# Patient Record
Sex: Male | Born: 1940 | Race: White | Hispanic: No | Marital: Married | Smoking: Former smoker
Health system: Southern US, Community
[De-identification: ages and names within clinical notes are randomized; demographics above are authoritative.]

## PROBLEM LIST (undated history)

## (undated) DIAGNOSIS — R9430 Abnormal result of cardiovascular function study, unspecified: Secondary | ICD-10-CM

## (undated) DIAGNOSIS — Z01810 Encounter for preprocedural cardiovascular examination: Secondary | ICD-10-CM

## (undated) DIAGNOSIS — R062 Wheezing: Secondary | ICD-10-CM

## (undated) DIAGNOSIS — C7931 Secondary malignant neoplasm of brain: Secondary | ICD-10-CM

## (undated) DIAGNOSIS — R569 Unspecified convulsions: Secondary | ICD-10-CM

## (undated) DIAGNOSIS — M519 Unspecified thoracic, thoracolumbar and lumbosacral intervertebral disc disorder: Secondary | ICD-10-CM

## (undated) DIAGNOSIS — Z923 Personal history of irradiation: Secondary | ICD-10-CM

## (undated) DIAGNOSIS — J449 Chronic obstructive pulmonary disease, unspecified: Secondary | ICD-10-CM

## (undated) DIAGNOSIS — I6529 Occlusion and stenosis of unspecified carotid artery: Secondary | ICD-10-CM

## (undated) DIAGNOSIS — Z9981 Dependence on supplemental oxygen: Secondary | ICD-10-CM

## (undated) DIAGNOSIS — R51 Headache: Secondary | ICD-10-CM

## (undated) DIAGNOSIS — J189 Pneumonia, unspecified organism: Secondary | ICD-10-CM

## (undated) DIAGNOSIS — K5900 Constipation, unspecified: Secondary | ICD-10-CM

## (undated) DIAGNOSIS — R251 Tremor, unspecified: Secondary | ICD-10-CM

## (undated) DIAGNOSIS — J029 Acute pharyngitis, unspecified: Secondary | ICD-10-CM

## (undated) DIAGNOSIS — Z9221 Personal history of antineoplastic chemotherapy: Secondary | ICD-10-CM

## (undated) DIAGNOSIS — G2581 Restless legs syndrome: Secondary | ICD-10-CM

## (undated) DIAGNOSIS — K219 Gastro-esophageal reflux disease without esophagitis: Secondary | ICD-10-CM

## (undated) DIAGNOSIS — R0602 Shortness of breath: Secondary | ICD-10-CM

## (undated) DIAGNOSIS — R918 Other nonspecific abnormal finding of lung field: Secondary | ICD-10-CM

## (undated) DIAGNOSIS — F419 Anxiety disorder, unspecified: Secondary | ICD-10-CM

## (undated) DIAGNOSIS — M1990 Unspecified osteoarthritis, unspecified site: Secondary | ICD-10-CM

## (undated) DIAGNOSIS — N400 Enlarged prostate without lower urinary tract symptoms: Secondary | ICD-10-CM

## (undated) DIAGNOSIS — C801 Malignant (primary) neoplasm, unspecified: Secondary | ICD-10-CM

## (undated) DIAGNOSIS — R519 Headache, unspecified: Secondary | ICD-10-CM

## (undated) DIAGNOSIS — I739 Peripheral vascular disease, unspecified: Secondary | ICD-10-CM

## (undated) DIAGNOSIS — Z8719 Personal history of other diseases of the digestive system: Secondary | ICD-10-CM

## (undated) DIAGNOSIS — I10 Essential (primary) hypertension: Secondary | ICD-10-CM

## (undated) HISTORY — DX: Wheezing: R06.2

## (undated) HISTORY — DX: Occlusion and stenosis of unspecified carotid artery: I65.29

## (undated) HISTORY — DX: Unspecified convulsions: R56.9

## (undated) HISTORY — DX: Restless legs syndrome: G25.81

## (undated) HISTORY — DX: Chronic obstructive pulmonary disease, unspecified: J44.9

## (undated) HISTORY — DX: Unspecified osteoarthritis, unspecified site: M19.90

## (undated) HISTORY — PX: HAND SURGERY: SHX662

## (undated) HISTORY — PX: COLONOSCOPY: SHX174

## (undated) HISTORY — DX: Unspecified thoracic, thoracolumbar and lumbosacral intervertebral disc disorder: M51.9

## (undated) HISTORY — PX: OTHER SURGICAL HISTORY: SHX169

## (undated) HISTORY — DX: Acute pharyngitis, unspecified: J02.9

---

## 2010-01-21 ENCOUNTER — Encounter: Admission: RE | Admit: 2010-01-21 | Discharge: 2010-01-21 | Payer: Self-pay | Admitting: Neurosurgery

## 2010-01-27 ENCOUNTER — Encounter (INDEPENDENT_AMBULATORY_CARE_PROVIDER_SITE_OTHER): Payer: Self-pay | Admitting: Neurosurgery

## 2010-01-27 ENCOUNTER — Ambulatory Visit (HOSPITAL_COMMUNITY): Admission: RE | Admit: 2010-01-27 | Discharge: 2010-01-27 | Payer: Self-pay | Admitting: Neurosurgery

## 2010-01-27 ENCOUNTER — Ambulatory Visit: Payer: Self-pay | Admitting: Vascular Surgery

## 2010-02-14 ENCOUNTER — Ambulatory Visit: Payer: Self-pay | Admitting: Diagnostic Radiology

## 2010-02-14 ENCOUNTER — Encounter: Payer: Self-pay | Admitting: Emergency Medicine

## 2010-02-14 ENCOUNTER — Ambulatory Visit (HOSPITAL_COMMUNITY): Admission: EM | Admit: 2010-02-14 | Discharge: 2010-02-14 | Payer: Self-pay | Admitting: Emergency Medicine

## 2010-02-19 ENCOUNTER — Encounter: Admission: RE | Admit: 2010-02-19 | Discharge: 2010-02-19 | Payer: Self-pay | Admitting: Neurosurgery

## 2010-02-25 ENCOUNTER — Ambulatory Visit: Payer: Self-pay | Admitting: Vascular Surgery

## 2010-03-12 ENCOUNTER — Ambulatory Visit (HOSPITAL_COMMUNITY): Admission: RE | Admit: 2010-03-12 | Discharge: 2010-03-12 | Payer: Self-pay | Admitting: Orthopedic Surgery

## 2010-04-16 ENCOUNTER — Ambulatory Visit: Payer: Self-pay | Admitting: Diagnostic Radiology

## 2010-04-16 ENCOUNTER — Emergency Department (HOSPITAL_COMMUNITY): Admission: EM | Admit: 2010-04-16 | Discharge: 2010-04-16 | Payer: Self-pay | Admitting: Emergency Medicine

## 2010-04-16 ENCOUNTER — Encounter: Payer: Self-pay | Admitting: Emergency Medicine

## 2010-04-17 ENCOUNTER — Ambulatory Visit (HOSPITAL_COMMUNITY): Admission: AD | Admit: 2010-04-17 | Discharge: 2010-04-17 | Payer: Self-pay | Admitting: Orthopedic Surgery

## 2010-08-26 ENCOUNTER — Ambulatory Visit (INDEPENDENT_AMBULATORY_CARE_PROVIDER_SITE_OTHER): Payer: Medicare Other | Admitting: Vascular Surgery

## 2010-08-26 ENCOUNTER — Other Ambulatory Visit: Payer: Medicare Other

## 2010-08-26 ENCOUNTER — Other Ambulatory Visit (INDEPENDENT_AMBULATORY_CARE_PROVIDER_SITE_OTHER): Payer: Medicare Other

## 2010-08-26 DIAGNOSIS — I6529 Occlusion and stenosis of unspecified carotid artery: Secondary | ICD-10-CM

## 2010-08-28 NOTE — Assessment & Plan Note (Signed)
OFFICE VISIT  [name], [name] DOB:  06-Oct-1940                                       08/26/2010 UYQIH#:[id number]  I saw this patient in the office today for continued follow-up of his carotid disease.  I had originally seen him in consultation in August 2011.  At that time he had bilateral 60%-79% carotid stenoses.  I explained that in patients who were asymptomatic we typically not recommend carotid endarterectomy unless the stenosis progressed to greater than 80%.  He comes in for a 14-month follow-up visit.  Since I saw him last, he has had no history of stroke, TIAs, expressive or receptive aphasia, or amaurosis fugax.  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  PAST MEDICAL HISTORY:  Has not changed since I saw him last.  He denies any history of diabetes, hypertension, hypercholesterolemia, or history of cardiac disease.  SOCIAL HISTORY:  Married.  He smokes currently 1-1/2 packs per day of cigarettes but had been smoking 2-1/2 packs per day until recently.  REVIEW OF SYSTEMS:  CARDIOVASCULAR:  He admits to orthopnea and dyspnea on exertion.  He had no chest pain or chest pressure.  MUSCULOSKELETAL: Does have arthritis. PULMONARY:  He had some occasional wheezing. NEUROLOGIC:  He has had some headaches.  He has had no dizziness, blackouts or seizures.  PHYSICAL EXAMINATION:  This is a pleasant 70 year old gentleman who appears his stated age.  Blood pressure 126/87, heart rate is 84 respiratory is 12.  Lungs:  Clear bilaterally to auscultation without rales, rhonchi or wheezing.  Cardiovascular:  He has bilateral carotid bruits.  He has a regular rate and rhythm.  He has palpable femoral pulses and warm well-perfused feet.  Abdomen:  Soft and nontender with normal pitched bowel sounds.  Musculoskeletal:  There are no major deformities or cyanosis.  Neurologic:  He has no focal weakness  or paresthesias.  He did have a carotid duplex scan in the office today which I have independently interpreted.  The velocities on the right have remained stable and he as a 60% to 79% stenosis on the right.  However, the velocities on the left have progressed.  He now has a greater than 80% left carotid stenosis.  Given the progression of the carotid stenosis on the left, I have recommended left carotid endarterectomy in order to lower his risk of future stroke.  We have discussed the indications for surgery and potential complications including but not limited to bleeding, stroke (peri procedural risk 1% to 2%), nerve injury, MI or other unpredictable medical problems.  Currently he is not willing to schedule surgery.  He would like to discuss this further with his family.  In the meantime, we have again discussed the importance of tobacco cessation.  Also he has not been taking aspirin as he has had problems in the past with upset stomach.  I have encouraged taken an 81 mg of enteric coated aspirin daily.  Hopefully he will call to schedule his left carotid endarterectomy in the near future.  Otherwise I have ordered a follow-up carotid duplex scan in 6 months so that we do not lose track of him however.  Again I have encouraged him to strongly consider proceeding with a left carotid endarterectomy.    [name]. [name], M.D. Electronically Signed  CSD/MEDQ  D:  08/26/2010  T:  08/27/2010  Job:  [id number]  cc:   [name], M.D. [name], M.D.

## 2010-09-10 NOTE — Procedures (Unsigned)
CAROTID DUPLEX EXAM  INDICATION:  Carotid stenosis.  HISTORY: Diabetes:  No. Cardiac:  No. Hypertension:  No. Smoking:  Yes, 1-1/2 packs per day. Previous Surgery:  No. CV History:  Asymptomatic. Amaurosis Fugax No, Paresthesias No, Hemiparesis No.                                      RIGHT             LEFT Brachial systolic pressure:         122               125 Brachial Doppler waveforms:         Normal            Normal Vertebral direction of flow:        Tardus parvus     Antegrade DUPLEX VELOCITIES (cm/sec) CCA peak systolic                   95                78 ECA peak systolic                   225               71 ICA peak systolic                   257               337 ICA end diastolic                   100               136 PLAQUE MORPHOLOGY:                  Calcific          Calcific PLAQUE AMOUNT:                      Moderate/severe   Severe PLAQUE LOCATION:                    ICA, ECA, CCA     ICA, ECA, CCA  IMPRESSION: 1. 60% to 79% stenosis of the right internal carotid artery. 2. 80% to 99% stenosis of the left internal carotid artery. 3. Right external carotid artery stenosis. 4. Low resistance in the left external carotid artery.  ___________________________________________ [name]. [name], M.D.  EM/MEDQ  D:  08/26/2010  T:  08/26/2010  Job:  [id number]

## 2010-09-24 LAB — CBC
HCT: 41.9 % (ref 39.0–52.0)
Hemoglobin: 14.1 g/dL (ref 13.0–17.0)
MCH: 33.5 pg (ref 26.0–34.0)
MCV: 101.2 fL — ABNORMAL HIGH (ref 78.0–100.0)
Platelets: 313 10*3/uL (ref 150–400)
RBC: 4.19 MIL/uL — ABNORMAL LOW (ref 4.22–5.81)
RDW: 12.9 % (ref 11.5–15.5)
WBC: 10.7 10*3/uL — ABNORMAL HIGH (ref 4.0–10.5)
WBC: 11.9 10*3/uL — ABNORMAL HIGH (ref 4.0–10.5)

## 2010-09-24 LAB — BASIC METABOLIC PANEL
BUN: 11 mg/dL (ref 6–23)
CO2: 25 mEq/L (ref 19–32)
Chloride: 100 mEq/L (ref 96–112)
Glucose, Bld: 83 mg/dL (ref 70–99)
Potassium: 4.1 mEq/L (ref 3.5–5.1)

## 2010-09-24 LAB — DIFFERENTIAL
Basophils Absolute: 0.2 10*3/uL — ABNORMAL HIGH (ref 0.0–0.1)
Eosinophils Absolute: 0.1 10*3/uL (ref 0.0–0.7)
Eosinophils Relative: 1 % (ref 0–5)
Lymphocytes Relative: 45 % (ref 12–46)
Monocytes Absolute: 0.8 10*3/uL (ref 0.1–1.0)

## 2010-09-24 LAB — SURGICAL PCR SCREEN: MRSA, PCR: NEGATIVE

## 2010-09-25 LAB — CBC
HCT: 40.7 % (ref 39.0–52.0)
HCT: 44.2 % (ref 39.0–52.0)
MCHC: 35.1 g/dL (ref 30.0–36.0)
MCHC: 34.6 g/dL (ref 30.0–36.0)
MCV: 96.9 fL (ref 78.0–100.0)
MCV: 96.5 fL (ref 78.0–100.0)
RDW: 13.7 % (ref 11.5–15.5)
RDW: 13.4 % (ref 11.5–15.5)

## 2010-09-25 LAB — POCT I-STAT, CHEM 8
Calcium, Ion: 1.13 mmol/L (ref 1.12–1.32)
HCT: 48 % (ref 39.0–52.0)
TCO2: 22 mmol/L (ref 0–100)

## 2010-09-25 LAB — DIFFERENTIAL
Basophils Absolute: 0 10*3/uL (ref 0.0–0.1)
Basophils Relative: 0 % (ref 0–1)
Eosinophils Absolute: 0.1 10*3/uL (ref 0.0–0.7)
Eosinophils Relative: 1 % (ref 0–5)
Monocytes Absolute: 0.7 10*3/uL (ref 0.1–1.0)

## 2010-10-08 ENCOUNTER — Observation Stay (HOSPITAL_COMMUNITY)
Admission: EM | Admit: 2010-10-08 | Discharge: 2010-10-09 | DRG: 312 | Disposition: A | Discharge status: Home / Self Care | Payer: Medicare Other | Attending: Internal Medicine | Admitting: Internal Medicine

## 2010-10-08 ENCOUNTER — Emergency Department (HOSPITAL_COMMUNITY): Payer: Medicare Other

## 2010-10-08 DIAGNOSIS — G2581 Restless legs syndrome: Secondary | ICD-10-CM | POA: Insufficient documentation

## 2010-10-08 DIAGNOSIS — R55 Syncope and collapse: Principal | ICD-10-CM | POA: Insufficient documentation

## 2010-10-08 DIAGNOSIS — R42 Dizziness and giddiness: Secondary | ICD-10-CM | POA: Insufficient documentation

## 2010-10-08 DIAGNOSIS — J438 Other emphysema: Secondary | ICD-10-CM | POA: Insufficient documentation

## 2010-10-08 DIAGNOSIS — F17200 Nicotine dependence, unspecified, uncomplicated: Secondary | ICD-10-CM | POA: Insufficient documentation

## 2010-10-08 DIAGNOSIS — I10 Essential (primary) hypertension: Secondary | ICD-10-CM | POA: Insufficient documentation

## 2010-10-08 DIAGNOSIS — I6529 Occlusion and stenosis of unspecified carotid artery: Secondary | ICD-10-CM | POA: Insufficient documentation

## 2010-10-08 LAB — DIFFERENTIAL
Basophils Absolute: 0 10*3/uL (ref 0.0–0.1)
Basophils Relative: 1 % (ref 0–1)
Monocytes Absolute: 0.6 10*3/uL (ref 0.1–1.0)
Neutro Abs: 5.1 10*3/uL (ref 1.7–7.7)
Neutrophils Relative %: 64 % (ref 43–77)

## 2010-10-08 LAB — POCT I-STAT, CHEM 8
Creatinine, Ser: 0.9 mg/dL (ref 0.4–1.5)
Hemoglobin: 15 g/dL (ref 13.0–17.0)
Sodium: 134 mEq/L — ABNORMAL LOW (ref 135–145)
TCO2: 24 mmol/L (ref 0–100)

## 2010-10-08 LAB — URINALYSIS, ROUTINE W REFLEX MICROSCOPIC
Glucose, UA: NEGATIVE mg/dL
Specific Gravity, Urine: 1.009 (ref 1.005–1.030)
pH: 6.5 (ref 5.0–8.0)

## 2010-10-08 LAB — POCT CARDIAC MARKERS: Myoglobin, poc: 75.6 ng/mL (ref 12–200)

## 2010-10-08 LAB — CK TOTAL AND CKMB (NOT AT ARMC)
CK, MB: 6.1 ng/mL (ref 0.3–4.0)
Relative Index: 4.7 — ABNORMAL HIGH (ref 0.0–2.5)
Total CK: 130 U/L (ref 7–232)

## 2010-10-08 LAB — CARDIAC PANEL(CRET KIN+CKTOT+MB+TROPI): Relative Index: 3.6 — ABNORMAL HIGH (ref 0.0–2.5)

## 2010-10-08 LAB — CBC
Hemoglobin: 14.1 g/dL (ref 13.0–17.0)
MCHC: 35.3 g/dL (ref 30.0–36.0)

## 2010-10-08 LAB — TROPONIN I: Troponin I: <0.01 ng/mL (ref 0.00–0.06)

## 2010-10-09 DIAGNOSIS — I517 Cardiomegaly: Secondary | ICD-10-CM

## 2010-10-09 DIAGNOSIS — R55 Syncope and collapse: Secondary | ICD-10-CM

## 2010-10-09 LAB — CBC
HCT: 40.4 % (ref 39.0–52.0)
Hemoglobin: 13.9 g/dL (ref 13.0–17.0)
WBC: 6.6 10*3/uL (ref 4.0–10.5)

## 2010-10-09 LAB — BASIC METABOLIC PANEL
CO2: 27 mEq/L (ref 19–32)
GFR calc Af Amer: >60 mL/min (ref >60)
GFR calc non Af Amer: >60 mL/min (ref >60)
Glucose, Bld: 96 mg/dL (ref 70–99)
Potassium: 4.3 mEq/L (ref 3.5–5.1)
Sodium: 140 mEq/L (ref 135–145)

## 2010-10-09 LAB — LIPID PANEL
LDL Cholesterol: 94 mg/dL (ref 0–99)
Triglycerides: 46 mg/dL (ref <150)

## 2010-10-09 LAB — CARDIAC PANEL(CRET KIN+CKTOT+MB+TROPI)
CK, MB: 5 ng/mL — ABNORMAL HIGH (ref 0.3–4.0)
Relative Index: 3.5 — ABNORMAL HIGH (ref 0.0–2.5)
Total CK: 141 U/L (ref 7–232)

## 2010-10-14 ENCOUNTER — Ambulatory Visit (INDEPENDENT_AMBULATORY_CARE_PROVIDER_SITE_OTHER): Payer: Medicare Other | Admitting: Vascular Surgery

## 2010-10-14 ENCOUNTER — Other Ambulatory Visit: Payer: Medicare Other

## 2010-10-14 DIAGNOSIS — I6529 Occlusion and stenosis of unspecified carotid artery: Secondary | ICD-10-CM

## 2010-10-14 NOTE — Discharge Summary (Signed)
[name], [name]               ACCOUNT NO.:  [id number]  MEDICAL RECORD NO.:  [id number]           PATIENT TYPE:  I  LOCATION:  2029                         FACILITY:  [facility]  PHYSICIAN:  Thad Ranger, MD       DATE OF BIRTH:  Jan 22, 1941  DATE OF ADMISSION:  10/08/2010 DATE OF DISCHARGE:                        DISCHARGE SUMMARY - REFERRING   PRIMARY CARE PHYSICIAN:  [name], MD  DISCHARGE DIAGNOSES: 1. Syncope. 2. Heavy nicotine abuse. 3. Chronic obstructive pulmonary disease. 4. Bilateral carotid disease, worse in left internal carotid artery     disease. 5. History of restless legs syndrome. 6. History of lumbar disk disease.  CONSULTATIONS:  Vascular Surgery, Dr. Myra Gianotti.  DISCHARGE MEDICATIONS: 1. Advair Diskus 250/50 mcg 1 puff inhaled b.i.d. 2. Albuterol inhaler 1 puff inhaled every 8 hours and q.4 h p.r.n. for     shortness of breath/wheezing. 3. Mucinex 600 mg p.o. b.i.d. 4. Aspirin 81 mg p.o. daily. 5. Oxycodone/acetaminophen 5/325 mg 1 tablet every evening as needed     for pain. 6. Pramipexole 0.5 mg p.o. q.p.m.  RADIOLOGICAL DATA:  Chest x-ray two-view March 29, stable to moderate changes of chronic bronchitis and/or no acute cardiopulmonary disease. CT head without contrast, October 08, 2010, no acute intracranial abnormality.  Carotid Doppler exam recently done in February 2012 showed 60-79% stenosis of the right internal carotid artery, 80-99% stenosis of left internal carotid artery.  Right external carotid artery stenosis, low resistance in the left external carotid artery.  A 2-D echo is pending at the time of my dictation.  BRIEF HOSPITALIZATION COURSE:  [name] is a 70 year old male with known history of carotid artery disease, heavy nicotine use, and COPD presented with a syncopal episode at work. 1. Syncopal episode, possibly secondary to carotid artery disease     versus vasovagal.  The patient was admitted to the telemonitor  floor and ruled out for acute ACS.  The patient recently had     carotid Dopplers study done last month which showed 80-99% stenosis     of the left internal carotid artery with 60-79% stenosis of the     right internal carotid artery.  Vascular Surgery was consulted and     per Dr. Myra Gianotti, Vascular Surgery recommendation, the patient is to     see Dr. [name] on Wednesday, October 14, 2010, for outpatient     ultrasound and further workup.  At the time of the dictation, the 2-     D echocardiogram is still pending.  The patient was continued on     his home medication which included aspirin.  Lipid profile was     essentially within normal limits. 2. COPD with heavy nicotine abuse.  The patient was counseled strongly     on smoking cessation.  He had to stop taking his inhalers.  Hence,     he was restarted on Advair Diskus and albuterol.  He was     recommended     NicoDerm patches, however, he declined.  He should have outpatient     PFTs done by his primary care physician.  DISPOSITION:  The patient will be discharged home once the 2-D echo results are received, currently awaiting.     Thad Ranger, MD     RR/MEDQ  D:  10/09/2010  T:  10/09/2010  Job:  [id number]  cc:   Molly Maduro L. Foy Guadalajara, M.D. Jorge Ny, MD [name]. [name], M.D.  Electronically Signed by Andres Labrum RAI  on 10/14/2010 04:45:22 PM

## 2010-10-15 ENCOUNTER — Inpatient Hospital Stay (HOSPITAL_COMMUNITY)
Admission: RE | Admit: 2010-10-15 | Discharge: 2010-10-16 | DRG: 039 | Disposition: A | Discharge status: Home / Self Care | Payer: Medicare Other | Source: Ambulatory Visit | Attending: Vascular Surgery | Admitting: Vascular Surgery

## 2010-10-15 ENCOUNTER — Other Ambulatory Visit: Payer: Self-pay | Admitting: Vascular Surgery

## 2010-10-15 DIAGNOSIS — J449 Chronic obstructive pulmonary disease, unspecified: Secondary | ICD-10-CM | POA: Diagnosis present

## 2010-10-15 DIAGNOSIS — J4489 Other specified chronic obstructive pulmonary disease: Secondary | ICD-10-CM | POA: Diagnosis present

## 2010-10-15 DIAGNOSIS — K219 Gastro-esophageal reflux disease without esophagitis: Secondary | ICD-10-CM | POA: Diagnosis present

## 2010-10-15 DIAGNOSIS — I658 Occlusion and stenosis of other precerebral arteries: Secondary | ICD-10-CM | POA: Diagnosis present

## 2010-10-15 DIAGNOSIS — I6529 Occlusion and stenosis of unspecified carotid artery: Secondary | ICD-10-CM

## 2010-10-15 DIAGNOSIS — Z7982 Long term (current) use of aspirin: Secondary | ICD-10-CM

## 2010-10-15 DIAGNOSIS — F17200 Nicotine dependence, unspecified, uncomplicated: Secondary | ICD-10-CM | POA: Diagnosis present

## 2010-10-15 DIAGNOSIS — Z79899 Other long term (current) drug therapy: Secondary | ICD-10-CM

## 2010-10-15 HISTORY — PX: CAROTID ENDARTERECTOMY: SUR193

## 2010-10-15 LAB — CBC
Hemoglobin: 14.4 g/dL (ref 13.0–17.0)
MCH: 32.8 pg (ref 26.0–34.0)
MCV: 95.2 fL (ref 78.0–100.0)
Platelets: 272 10*3/uL (ref 150–400)
RBC: 4.39 MIL/uL (ref 4.22–5.81)
WBC: 6.2 10*3/uL (ref 4.0–10.5)

## 2010-10-15 LAB — COMPREHENSIVE METABOLIC PANEL
ALT: 36 U/L (ref 0–53)
AST: 25 U/L (ref 0–37)
Albumin: 3.9 g/dL (ref 3.5–5.2)
CO2: 25 mEq/L (ref 19–32)
Calcium: 9.1 mg/dL (ref 8.4–10.5)
Creatinine, Ser: 0.83 mg/dL (ref 0.4–1.5)
GFR calc Af Amer: >60 mL/min (ref >60)
GFR calc non Af Amer: >60 mL/min (ref >60)
Sodium: 133 mEq/L — ABNORMAL LOW (ref 135–145)

## 2010-10-15 LAB — TYPE AND SCREEN: ABO/RH(D): A POS

## 2010-10-15 LAB — PROTIME-INR: INR: 0.91 (ref 0.00–1.49)

## 2010-10-15 LAB — SURGICAL PCR SCREEN: MRSA, PCR: NEGATIVE

## 2010-10-15 NOTE — Procedures (Signed)
OPERATIVE REPORT  [name], [name] DOB:  [birth year]                                       10/15/2010 [id number]#:[id number]  PREOPERATIVE DIAGNOSIS:  Greater than 80% left carotid stenosis.  POSTOPERATIVE DIAGNOSIS:  Greater than 80% left carotid stenosis.  PROCEDURES:  Left carotid endarterectomy with Dacron patch angioplasty.  ASSISTANT:  Dr. Imogene Rice and Della Goo, PA-C.  ANESTHESIA:  General.  INDICATIONS:  This is a pleasant 70 year old gentleman whom I had seen in consultation August 2011 with bilateral carotid disease.  On a follow- up visit in February, the left carotid stenosis had progressed to greater than 80%.  He was asymptomatic; however, given the severity of the stenosis, left carotid endarterectomy was recommended in order to lower his risk of future stroke.  Initially he was reluctant to proceed with surgery but he had an episode where he passed out of work and became unresponsive and therefore at that point was willing to reconsider carotid endarterectomy.  I discussed this with him in the office on October 14, 2010 and he wished to proceed and therefore he was brought in for elective left carotid endarterectomy.  TECHNIQUE:  The patient was taken to the operating room and received his general anesthetic.  An arterial line had been placed by Anesthesia. The left neck was prepped and draped in the usual sterile fashion.  An incision was made along the anterior border of the sternocleidomastoid and the dissection carried down to the common carotid artery which was dissected free and controlled with Rummel tourniquet.  There was extensive plaque throughout the common carotid artery and I had to extend the incision essentially down to the manubrium and fully dissect out the common carotid artery in order to get below the plaque.  Facial vein was divided between 2-0 silk ties.  The internal carotid artery was controlled above the plaque in the  external carotid artery and superior thyroid arteries were also controlled.  The patient was then heparinized, after the heparin had circulated, clamps were then placed on the internal and then the external and then the common carotid artery.  A longitudinal arteriotomy was made in the common carotid artery and this was extended through the plaque into the internal carotid artery.  Proximally the arteriotomy was extended beyond the area of disease within the common carotid artery where the plaque then tapered.  A 10 shunt was placed into the internal carotid artery, it back bled and then placed in the common carotid artery and secured with Rummel tourniquet.  Flow was reestablished at the shunt.  Next, an endarterectomy plane was established proximally and the plaque was sharply divided.  Eversion endarterectomy was performed of the external carotid artery.  Distally there was a nice taper in the plaque and no tacking sutures were required.  The artery was irrigated with copious amounts of heparin and dextran and all loose debris removed.  The Dacron patch was then sewn using continuous 6-0 Prolene suture.  Prior to completing the patch closure, the shunt was removed.  The artery was back bled and flushed appropriately, the anastomosis completed and flow reestablished first to the external carotid artery and then to the internal carotid artery.  At the completion, there was a good Doppler signal distal to the patch.  Hemostasis was obtained in the wound and the heparin was partially reversed with protamine.  The deep layer was closed with running 3-0 Vicryl.  Platysma was closed with running 3-0 Vicryl.  The skin was closed with a 4-0 subcuticular stitch.  A sterile dressing was applied.  The patient tolerated the procedure well and was transferred to the recovery room in stable condition.  All needle and sponge counts were correct.  [name]. [name], [name]. Electronically  Signed  CSD/MEDQ  Rice:  10/15/2010  T:  10/15/2010  Job:  [id number]  cc:   [name], [name]. [name], [name].

## 2010-10-15 NOTE — Assessment & Plan Note (Signed)
OFFICE VISIT  DARRYLL, RAJU DOB:  10-04-1940                                       10/14/2010 [id number]#:[id number]  He is scheduled for left carotid endarterectomy tomorrow.  I have dictated a full history and physical on the hospital line.    [name]. [name], M.D. Electronically Signed  CSD/MEDQ  D:  10/14/2010  T:  10/15/2010  Job:  [id number]

## 2010-10-15 NOTE — H&P (Signed)
[name], ERMIS               ACCOUNT NO.:  [id number]  MEDICAL RECORD NO.:  [id number]           PATIENT TYPE:  LOCATION:                                 FACILITY:  PHYSICIAN:  [name]. [name], M.D.DATE OF BIRTH:  June 06, 1941  DATE OF ADMISSION: DATE OF DISCHARGE:                             HISTORY & PHYSICAL   DATE OF PLANNED ADMISSION:  October 15, 2010  REASON FOR ADMISSION:  Greater than 80% left carotid stenosis.  HISTORY OF PRESENT ILLNESS:  This is a pleasant 70 year old gentleman who I had originally seen in consultation in August 2011 with bilateral carotid disease.  At that time, he had bilateral 60% to 79% carotid stenosis.  He was asymptomatic.  He came in for a followup visit on August 26, 2010 and the carotid stenosis on the left had progressed to greater than 80%.  I have recommended left carotid endarterectomy; however, the patient was not agreeable to proceed with the surgery.  In addition, he had a 60% to 79% right carotid stenosis.  Since that time, he had an episode at work where he briefly became unconscious.  He had spoken to Dr. Myra Gianotti who recommended that he come in for an office visit today to re-discuss proceeding with left carotid endarterectomy. He has had no further episodes.  He previously had no history of stroke, TIAs, expressive or receptive aphasia or amaurosis fugax.  PAST MEDICAL HISTORY:  Significant for a significant history of tobacco use.  He smokes two and a half packs per day.  He denies any history of diabetes, hypertension, hypercholesterolemia, history of previous myocardial infarction, history of congestive heart failure or history of COPD.  He does have a history of lumbar disk disease at the L5-S1 level and is followed by Dr. Lovell Sheehan.  PAST SURGICAL HISTORY:  Significant for repair of the left long, ring, and small finger after an injury with a saw.  SOCIAL HISTORY:  He is married.  He has one child.  He smokes 2  and a half packs per day of cigarettes and has been smoking for 50 years.  FAMILY HISTORY:  There is no history of premature cardiovascular disease.  MEDICATIONS: 1. Aspirin 81 mg p.o. daily. 2. Pramipexole dihydric 1 mg p.o. at bedtime. 3. OxyContin one p.o. daily p.r.n. pain.  REVIEW OF SYSTEMS:  GENERAL:  He has had no recent weight loss, weight gain or problems with his appetite.  CARDIOVASCULAR:  He has had no chest pain, chest pressure, palpitations, or arrhythmias.  He had no history of DVT or phlebitis.  GI:  He has had no recent change in his bowel habits and has no history of reflux.  NEUROLOGIC:  He has had no recent dizziness, blackouts, headaches, or seizures since his episode where he became unconscious at work briefly.  PULMONARY:  He has had occasional wheezing.  Hematologic, GU, ENT, musculoskeletal, psychiatric, integumentary review of systems is unremarkable and is documented on the medical history form in his chart.  ALLERGIES:  No known drug allergies.  PHYSICAL EXAMINATION:  GENERAL:  This is a pleasant 70 year old gentleman who appears his stated age.  VITAL SIGNS:  Blood pressure is 133/83, heart rate is 78, respiratory rate is 22. HEENT: Unremarkable. LUNGS:  Clear bilaterally to auscultation without rales, rhonchi, or wheezing. CARDIOVASCULAR:  He has bilateral carotid bruits.  He has a regular rate and rhythm. EXTREMITIES:  He has palpable femoral pulses and warm well-perfused feet. ABDOMEN:  Soft and nontender with normal pitched bowel sounds. MUSCULOSKELETAL:  No major deformities or cyanosis. NEUROLOGIC:  He has no focal weakness or paresthesias.  His duplex scan shows a greater than 80% left carotid stenosis with a 60% to 79% right carotid stenosis.  Given the severity of the left carotid stenosis in addition to his recent episode at work, I have recommended we proceed with left carotid endarterectomy in order to lower his risk of future  stroke.  We have discussed the indications for the procedure and the potential complications including, but not limited to bleeding, stroke (periprocedural risk 1% to 2%), nerve injury, MI, or other unpredictable medical problems.  All of his questions were answered and he is agreeable to proceed.  His surgery is scheduled for tomorrow, October 15, 2010.     [name]. [name], M.D.     CSD/MEDQ  D:  10/14/2010  T:  10/14/2010  Job:  [id number]  cc:   [name], M.D. [name], M.D.  Electronically Signed by Waverly Ferrari M.D. on 10/15/2010 04:11:19 PM

## 2010-10-16 LAB — BASIC METABOLIC PANEL
BUN: 7 mg/dL (ref 6–23)
CO2: 26 mEq/L (ref 19–32)
Chloride: 105 mEq/L (ref 96–112)
GFR calc non Af Amer: >60 mL/min (ref >60)
Glucose, Bld: 141 mg/dL — ABNORMAL HIGH (ref 70–99)
Potassium: 3.9 mEq/L (ref 3.5–5.1)
Sodium: 134 mEq/L — ABNORMAL LOW (ref 135–145)

## 2010-10-16 LAB — CBC
HCT: 35.2 % — ABNORMAL LOW (ref 39.0–52.0)
Hemoglobin: 11.7 g/dL — ABNORMAL LOW (ref 13.0–17.0)
MCV: 98.1 fL (ref 78.0–100.0)
RBC: 3.59 MIL/uL — ABNORMAL LOW (ref 4.22–5.81)
RDW: 14.1 % (ref 11.5–15.5)
WBC: 7.3 10*3/uL (ref 4.0–10.5)

## 2010-10-23 NOTE — H&P (Signed)
[name], [name]               ACCOUNT NO.:  [id number]  MEDICAL RECORD NO.:  [id number]           PATIENT TYPE:  I  LOCATION:  2029                         FACILITY:  [facility]  PHYSICIAN:  Hartley Barefoot, MD    DATE OF BIRTH:  [birth year]  DATE OF ADMISSION:  10/08/2010 DATE OF DISCHARGE:  10/09/2010                             HISTORY & PHYSICAL   PRIMARY CARE PHYSICIAN:  Molly Maduro A. Nicholos Johns, MD  CHIEF COMPLAINT:  "I passed out."  HISTORY OF PRESENT ILLNESS:  [name] is a 70 year old Caucasian male who has a history of chronic back pain and carotid stenosis.  It was found in February that he had approximately 60-80% bilateral carotid stenosis.  He is being managed by Vascular Surgery.  [name] reports that this morning he was outside with a paint roller, getting ready to paint the side of a building.  When he lifted his arms, he blacked out. He was out for several seconds.  He had no bowel or urinaryincontinence.  When he woke up he was clear-headed.  He had no aura prior to passing out, no postictal state afterwards.  He denies any headache, chest pain, palpitations, or recent illness.  PAST MEDICAL HISTORY:  Significant for 1. Emphysema. 2. Restless leg. 3. Chronic back pain. 4. Carotid artery disease. 5. Traumatic injury to his left ring finger requiring skin graft.  HOME MEDICATIONS: 1. Pramipexole 0.5 mg 1 tablet every evening. 2. Oxycodone/acetaminophen 5/325 mg 1 tablet every evening as needed     for pain. 3. Aspirin 81 mg 1 tablet every evening.  ALLERGIES:  He has no known drug allergies.  REVIEW OF SYSTEMS:  Positive for chronic cough.  Otherwise all systems were reviewed and found to be negative.  SOCIAL HISTORY:  Positive for a 50-year history of 2.5 packs of tobacco daily.  The patient also tells me that he drinks one or two 24-ounce beers daily.  He denies any recreational drug use.  He is married and lives at home with his wife.  FAMILY  HISTORY:  Significant for his father who died at age 16 with an MI; his brother who is alive at age 65, but at age 69 he had a CVA; his mother who died of alcoholic cirrhosis.  PHYSICAL EXAMINATION:  GENERAL:  This is a well-developed, thin Caucasian male lying in no apparent distress in the [facility] ED. VITAL SIGNS:  Temperature 98.7, pulse 96, respirations 20, blood pressure 147/104. HEENT:  Head is atraumatic, normocephalic.  Eyes are anicteric with pupils that are equal, round, reactive to light.  He is wearing glasses. Nose shows no nasal discharge or exterior lesions.  Mouth has moist mucous membranes with good dentition. NECK:  Supple with midline trachea.  No JVD.  No lymphadenopathy. CHEST:  Demonstrates no accessory muscle use.  He is clear to auscultation. HEART:  Regular rate and rhythm without obvious murmurs, rubs, or gallops. ABDOMEN:  Soft, nontender, nondistended without masses.  He has good bowel sounds. EXTREMITIES:  No clubbing, cyanosis, or edema.  He can move all 4 extremities and has 5/5 strength in each. SKIN:  No rashes, bruises, or lesions. NEURO:  Cranial nerves II-XII are grossly intact.  He has no facial asymmetries, no obvious focal neuro deficits. PSYCHIATRIC:  The patient is alert and oriented.  His demeanor is pleasant, cooperative.  Grooming is good.  LABORATORY DATA:  Pertinent for point-of-care cardiac enzymes x1 that are negative, CBC that is within normal limits, BMET that is within normal limits.  UA is negative for signs of infection.  Chest x-ray shows stable-to-moderate changes of chronic bronchitis and asthma but no acute cardiopulmonary infection.  ASSESSMENT:  Dr. Hartley Barefoot has seen and examined the patient, collected a history, reviewed his chart, and spoken at length with the PA, the patient, and his wife about the case.  Her impression is that this is a very pleasant 70 year old male with: 1. Syncope likely related to his  carotid artery disease, vs vaso vagal.  We will     admit him to telemetry, monitor him closely for arrhythmias, check     a 2-D echo, cycle his cardiac enzymes, check a fasting lipid panel,     hemoglobin A1c and a TSH.  We will also check a CT head. 2. Vascular disease, specifically carotid stenosis to be handled by     Dr. Myra Gianotti. 3. Hypertension.  We will provide him with hydralazine p.r.n. as we     want to be careful not to lower the blood pressure too low and to     risk hypoperfusion given his carotid stenosis. 4. Tobacco abuse.  We will ask for cessation counseling.     Additionally, the patient has been counseled in the emergency     department that he needs to stop smoking. 5. Chronic obstructive pulmonary disease.  The patient will be     continued on his Advair, his home medications. 6. This patient is a full code.  Further recommendations will be forthcoming pending this patient's medical evolution.     Stephani Police, PA   ______________________________ Hartley Barefoot, MD    MLY/MEDQ  D:  10/09/2010  T:  10/09/2010  Job:  [id number]  cc:   Elana Alm. Nicholos Johns, M.D.  Electronically Signed by Algis Downs PA on 10/11/2010 07:33:34 PM Electronically Signed by Hartley Barefoot MD on 10/23/2010 09:48:57 PM

## 2010-10-27 NOTE — Discharge Summary (Signed)
[name], [name]               ACCOUNT NO.:  [id number]  MEDICAL RECORD NO.:  [id number]           PATIENT TYPE:  I  LOCATION:  3315                         FACILITY:  [facility]  PHYSICIAN:  [name]. [name], M.D.DATE OF BIRTH:  [birth year]  DATE OF ADMISSION:  10/15/2010 DATE OF DISCHARGE:  10/16/2010                              DISCHARGE SUMMARY   ADMIT DIAGNOSIS:  Left internal carotid artery stenosis.  PAST MEDICAL HISTORY AND DISCHARGE DIAGNOSES: 1. Bilateral extracranial cerebrovascular occlusive disease, status     post left carotid endarterectomy. 2. Tobacco abuse. 3. History of lumbar disk disease.  BRIEF HISTORY:  The patient is 70 year old male who has been followed by Dr. [name] as an outpatient for known bilateral carotid disease.  At the time, he had bilateral 60-79% carotid stenosis.  He was asymptomatic.  Surgery was recommended, however, the patient did not wished to proceed at that time.  From a visit in February 2012, it was known the patient  had progression of carotid stenosis of the left to greater than 80%. He again did not wish  to proceed with surgery.   Dr. [name] saw the patient again in the office last week at which time the patient stated he had an episode at work during which he briefly became unconscious. Secondary to these findings, Dr. [name] again recommended the patient proceed with carotid endarterectomy for stroke risk reduction.  The patient agreed to proceed.  HOSPITAL COURSE:  The patient was admitted and then taken to the OR on October 15, 2010, for left carotid endarterectomy with Dacron patch angioplasty.  The patient tolerated procedure and was well hemodynamically stable immediately postoperatively.  He was transferred from the OR to the Post Anesthesia Care Unit in stable condition.  The patient was extubated without complication and woke up from the anesthesia neurologically intact.  On postoperative  day #1, the patient  is without complaint.  He is afebrile with stable vital signs.  He is tolerating ambulation, but has not eaten breakfast or voided yet.  PHYSICAL EXAMINATION:  CARDIAC:  Regular rate and rhythm. LUNGS:  Clear to auscultation. ABDOMEN:  Benign.   INCISION: clean, dry and intact with ecchymosis surrounding it.  There is no evidence of hematoma. He is neurologically intact.  The patient is doing well and is felt stable for discharge home today as long as he is able to tolerate a regular diet and void.  LABORATORY DATA:  CBC and BMP on October 16, 2010, white count 7.3, hemoglobin 11.7, hematocrit 35.2, platelets 220.  Sodium 134, potassium 3.9, BUN 7, creatinine 0.86.  Condition on discharge is stable.  DISCHARGE INSTRUCTIONS:  The patient received specific written discharge instructions regarding diet, activity, and wound care.  He will follow up with Dr. [name] in approximately 2 weeks.  He will be contacted by the office with date and time of that appointment.  DISCHARGE MEDICATIONS: 1. Percocet 5/325 mg 1-2 q.4-6 hours p.r.n. pain. 2. Advair Diskus 250/50 one puff inhaled b.i.d. 3. Albuterol inhaler 17 g 1 puff inhaled q.8 p.r.n. 4. Aspirin 81 mg daily. 5. Guaifenesin 600 mg 1 tablet p.o.  b.i.d. 6. Pramipexole  0.5 mg nightly. 7. Unisom OTC nightly p.r.n.     Pecola Leisure, PA   ______________________________ [name]. [name], M.D.    AY/MEDQ  D:  10/16/2010  T:  10/17/2010  Job:  [id number]  Electronically Signed by Pecola Leisure PA on 10/20/2010 10:02:11 AM Electronically Signed by Waverly Ferrari M.D. on 10/27/2010 08:36:59 AM

## 2010-11-04 ENCOUNTER — Ambulatory Visit (INDEPENDENT_AMBULATORY_CARE_PROVIDER_SITE_OTHER): Payer: Medicare Other | Admitting: Vascular Surgery

## 2010-11-04 DIAGNOSIS — I6529 Occlusion and stenosis of unspecified carotid artery: Secondary | ICD-10-CM

## 2010-11-05 NOTE — Assessment & Plan Note (Signed)
OFFICE VISIT  [name], [name] DOB:  1940/10/21                                       11/04/2010 [id number]#:[id number]  I saw patient in the office today for follow-up after his recent left carotid endarterectomy.  This is a pleasant 70 year old gentleman who I saw in consultation in August 2011 with bilateral carotid disease.  On a follow-up visit in February, the left carotid stenosis had progressed to greater than 80%, and left carotid endarterectomy was recommended. Initially he had decided not to proceed with surgery.  However, he had an episode where he passed out at work and became unresponsive, and for this reason he changed his mind.  He elected to proceed with left carotid endarterectomy, which was done on October 15, 2010.  He did well postoperatively and was discharged on postoperative day #1.  He returns for his first outpatient visit.  Overall he has been doing quite well. He has no specific complaints.  He has had no focal weakness or paresthesias.  PHYSICAL EXAMINATION:  Blood pressure 118/80, heart rate is 89.  His left neck incision has healed nicely.  He has good strength in the upper extremities and lower extremities bilaterally.  Lungs are clear bilaterally to auscultation.  Overall, I am pleased with his progress.  He does have a 60% to 79% right carotid stenosis; therefore, I have ordered a follow-up carotid duplex scan in 6 months, and I will see him back at that time.  In the meantime, he knows to continue taking his aspirin.  He is having some posterior neck pain, and I did give him a prescription for Percocet for pain for this.    [name]. [name], M.D. Electronically Signed  CSD/MEDQ  D:  11/04/2010  T:  11/05/2010  Job:  [id number]  cc:   [name], M.D. [name], M.D.

## 2010-11-24 NOTE — Consult Note (Signed)
NEW PATIENT CONSULTATION   [name], [name]  DOB:  12/15/1940                                       02/25/2010  ZOXWR#:[id number]   I saw the patient in the office today in consultation concerning  bilateral carotid disease.  This is a pleasant 70 year old right-handed  gentleman who was found to have a carotid bruit.  This prompted a  carotid duplex scan which was done at [facility] on 01/27/2010 which  showed bilateral 60% to 79% carotid stenoses.  He was sent for vascular  consultation by Dr. Lovell Sheehan.  The patient is right-handed.  He denies  any previous history of stroke, TIAs, expressive or receptive aphasia or  amaurosis fugax.   PAST MEDICAL HISTORY:  Fairly unremarkable.  He denies any history of  diabetes, hypertension, hypercholesterolemia, history of previous  myocardial infarction, history of congestive heart failure or history of  COPD.  He does have a history of restless leg syndrome.  He also has  some lumbar disk disease at the L5-S1 level and is followed by Dr.  Lovell Sheehan.   PAST SURGICAL HISTORY:  Significant for recent repair of his left long,  ring and small finger after an injury with a saw.   SOCIAL HISTORY:  He is married.  He has 1 child.  He smokes 2 to 2-1/2  packs per day of cigarettes and has been smoking for 50 years.   FAMILY HISTORY:  There is no history of premature cardiovascular  disease.   REVIEW OF SYSTEMS:  GENERAL:  He has had no recent weight loss, weight  gain or problems with his appetite.  CARDIOVASCULAR:  He has had no chest pain, chest pressure, palpitations  or arrhythmias.  He does admit to dyspnea on exertion.  He has had no  history of DVT or phlebitis.  ENT:  He has had a sore throat.  GI, neurologic, pulmonary, hematologic, GU, musculoskeletal,  psychiatric, integumentary review of systems is unremarkable and is  documented on the medical history form in his chart.   PHYSICAL EXAMINATION:  This is a pleasant  70 year old gentleman who  appears his stated age.  Blood pressure is 115/76, heart rate is 79,  respiratory rate is 12. HEENT:  Unremarkable.  Lungs:  Clear bilaterally  to auscultation without rales, rhonchi or wheezing.  Cardiovascular:  He  has bilateral carotid bruits.  He has a regular rate and rhythm.  He has  palpable femoral pulses and warm and well-perfused feet without ischemic  ulcers.  He has no significant lower extremity swelling.  Abdomen:  Soft  and nontender with normal-pitched bowel sounds.  Musculoskeletal:  He  has dressings on the 3 fingers that were operated on on the left hand.  Neurologic:  He has no focal weakness or paresthesias.  Skin:  There are  no ulcers or rashes.   I have reviewed his duplex study at [facility] which at that time showed an  end-diastolic velocity of 81 cm/sec on the right and 91 cm/sec on the  left.  We did confirm these findings with duplex scan in our office  today which I have independently interpreted, and this shows 60% to 79 %  carotid stenoses bilaterally.  The stenosis on the left is in the higher  end of that range.   I have explained that for stenosis less than 80%  which are asymptomatic,  we would typically follow these at 6 months intervals and only consider  carotid endarterectomy if the stenosis progresses to greater than 80% or  he develops new neurologic symptoms.  We have reviewed the potential  symptoms of cerebrovascular disease.  I plan on seeing him back in 6  months, and I have ordered a followup carotid duplex scan at that time.  He does know to continue taking his aspirin.  He knows to call sooner if  he has problems.     [name]. [name], M.D.  Electronically Signed   CSD/MEDQ  D:  02/25/2010  T:  02/26/2010  Job:  [id number]   cc:   [name], M.D.  [name], M.D.

## 2010-11-24 NOTE — Procedures (Signed)
CAROTID DUPLEX EXAM   INDICATION:  Carotid disease.   HISTORY:  Diabetes:  No.  Cardiac:  No.  Hypertension:  No.  Smoking:  Yes.  Previous Surgery:  No.  CV History:  No.  Amaurosis Fugax No, Paresthesias No, Hemiparesis No.                                       RIGHT             LEFT  Brachial systolic pressure:         120               130  Brachial Doppler waveforms:         Normal            Normal  Vertebral direction of flow:        Antegrade         Antegrade  DUPLEX VELOCITIES (cm/sec)  CCA peak systolic                   115               73  ECA peak systolic                   195               66  ICA peak systolic                   246               385  ICA end diastolic                   105               104  PLAQUE MORPHOLOGY:                  Calcific          Calcific  PLAQUE AMOUNT:                      Moderate-to-severe                  Moderate-to-severe  PLAQUE LOCATION:                    ICA, ECA, CCA     ICA, CCA, ECA   IMPRESSION:  1. 60% to 79% stenosis of the right proximal internal carotid artery.  2. High-end 60% to 79% stenosis of the left proximal internal carotid      artery.  3. Right external carotid artery stenosis.  Low resistive left      external carotid artery waveforms noted.   ___________________________________________  [name]. [name], M.D.   EM/MEDQ  D:  02/25/2010  T:  02/25/2010  Job:  [id number]

## 2011-01-15 ENCOUNTER — Emergency Department (HOSPITAL_COMMUNITY): Payer: Medicare Other

## 2011-01-15 ENCOUNTER — Inpatient Hospital Stay (HOSPITAL_COMMUNITY)
Admission: EM | Admit: 2011-01-15 | Discharge: 2011-01-17 | DRG: 190 | Disposition: A | Discharge status: Home / Self Care | Payer: Medicare Other | Attending: Internal Medicine | Admitting: Internal Medicine

## 2011-01-15 DIAGNOSIS — G4700 Insomnia, unspecified: Secondary | ICD-10-CM | POA: Diagnosis present

## 2011-01-15 DIAGNOSIS — J479 Bronchiectasis, uncomplicated: Secondary | ICD-10-CM | POA: Diagnosis present

## 2011-01-15 DIAGNOSIS — J441 Chronic obstructive pulmonary disease with (acute) exacerbation: Principal | ICD-10-CM | POA: Diagnosis present

## 2011-01-15 DIAGNOSIS — E871 Hypo-osmolality and hyponatremia: Secondary | ICD-10-CM | POA: Diagnosis present

## 2011-01-15 DIAGNOSIS — F17200 Nicotine dependence, unspecified, uncomplicated: Secondary | ICD-10-CM | POA: Diagnosis present

## 2011-01-15 DIAGNOSIS — K219 Gastro-esophageal reflux disease without esophagitis: Secondary | ICD-10-CM | POA: Diagnosis present

## 2011-01-15 DIAGNOSIS — G2581 Restless legs syndrome: Secondary | ICD-10-CM | POA: Diagnosis present

## 2011-01-15 DIAGNOSIS — R000 Tachycardia, unspecified: Secondary | ICD-10-CM | POA: Diagnosis present

## 2011-01-15 DIAGNOSIS — J9620 Acute and chronic respiratory failure, unspecified whether with hypoxia or hypercapnia: Secondary | ICD-10-CM | POA: Diagnosis present

## 2011-01-15 LAB — URINE MICROSCOPIC-ADD ON

## 2011-01-15 LAB — URINALYSIS, ROUTINE W REFLEX MICROSCOPIC
Glucose, UA: NEGATIVE mg/dL
Leukocytes, UA: NEGATIVE
Nitrite: NEGATIVE
Specific Gravity, Urine: 1.026 (ref 1.005–1.030)
pH: 6 (ref 5.0–8.0)

## 2011-01-15 LAB — DIFFERENTIAL
Basophils Absolute: 0 10*3/uL (ref 0.0–0.1)
Basophils Relative: 0 % (ref 0–1)
Eosinophils Relative: 0 % (ref 0–5)
Monocytes Absolute: 1.1 10*3/uL — ABNORMAL HIGH (ref 0.1–1.0)
Monocytes Relative: 8 % (ref 3–12)
Neutro Abs: 12.1 10*3/uL — ABNORMAL HIGH (ref 1.7–7.7)

## 2011-01-15 LAB — POCT I-STAT 3, ART BLOOD GAS (G3+)
Bicarbonate: 24.1 mEq/L — ABNORMAL HIGH (ref 20.0–24.0)
TCO2: 25 mmol/L (ref 0–100)
pO2, Arterial: 60 mmHg — ABNORMAL LOW (ref 80.0–100.0)

## 2011-01-15 LAB — COMPREHENSIVE METABOLIC PANEL
Albumin: 3.3 g/dL — ABNORMAL LOW (ref 3.5–5.2)
BUN: 9 mg/dL (ref 6–23)
Calcium: 9.1 mg/dL (ref 8.4–10.5)
GFR calc Af Amer: >60 mL/min (ref >60)
Glucose, Bld: 110 mg/dL — ABNORMAL HIGH (ref 70–99)
Sodium: 130 mEq/L — ABNORMAL LOW (ref 135–145)
Total Protein: 6.8 g/dL (ref 6.0–8.3)

## 2011-01-15 LAB — CBC
HCT: 39 % (ref 39.0–52.0)
Hemoglobin: 13.9 g/dL (ref 13.0–17.0)
MCH: 33.5 pg (ref 26.0–34.0)
MCHC: 35.6 g/dL (ref 30.0–36.0)
RDW: 14 % (ref 11.5–15.5)

## 2011-01-15 LAB — D-DIMER, QUANTITATIVE: D-Dimer, Quant: 0.68 ug/mL-FEU — ABNORMAL HIGH (ref 0.00–0.48)

## 2011-01-15 LAB — PRO B NATRIURETIC PEPTIDE: Pro B Natriuretic peptide (BNP): 296.8 pg/mL — ABNORMAL HIGH (ref 0–125)

## 2011-01-16 ENCOUNTER — Inpatient Hospital Stay (HOSPITAL_COMMUNITY): Payer: Medicare Other

## 2011-01-16 LAB — CARDIAC PANEL(CRET KIN+CKTOT+MB+TROPI)
CK, MB: 10.5 ng/mL (ref 0.3–4.0)
CK, MB: 11 ng/mL (ref 0.3–4.0)
Relative Index: 3.6 — ABNORMAL HIGH (ref 0.0–2.5)
Total CK: 269 U/L — ABNORMAL HIGH (ref 7–232)
Troponin I: <0.3 ng/mL (ref <0.30)
Troponin I: <0.3 ng/mL (ref <0.30)

## 2011-01-16 LAB — TSH: TSH: 0.893 u[IU]/mL (ref 0.350–4.500)

## 2011-01-16 LAB — URINE CULTURE: Culture: NO GROWTH

## 2011-01-16 MED ORDER — IOHEXOL 350 MG/ML SOLN
80.0000 mL | Freq: Once | INTRAVENOUS | Status: AC | PRN
  Administered 2011-01-16: 80 mL via INTRAVENOUS

## 2011-01-17 LAB — BASIC METABOLIC PANEL
BUN: 13 mg/dL (ref 6–23)
Chloride: 101 mEq/L (ref 96–112)
GFR calc Af Amer: >60 mL/min (ref >60)
Glucose, Bld: 128 mg/dL — ABNORMAL HIGH (ref 70–99)
Potassium: 3.8 mEq/L (ref 3.5–5.1)
Sodium: 135 mEq/L (ref 135–145)

## 2011-01-17 LAB — LIPID PANEL
HDL: 57 mg/dL (ref >39)
Total CHOL/HDL Ratio: 2.5 RATIO
Triglycerides: 66 mg/dL (ref <150)

## 2011-01-17 LAB — CBC
HCT: 35.6 % — ABNORMAL LOW (ref 39.0–52.0)
Hemoglobin: 12 g/dL — ABNORMAL LOW (ref 13.0–17.0)
RDW: 14.4 % (ref 11.5–15.5)
WBC: 14.5 10*3/uL — ABNORMAL HIGH (ref 4.0–10.5)

## 2011-01-29 NOTE — Discharge Summary (Signed)
[name], [name] NO.:  [id number]  MEDICAL RECORD NO.:  [id number]  LOCATION:  4743                         FACILITY:  [facility]  PHYSICIAN:  [name], MDDATE OF BIRTH:  [birth year]  DATE OF ADMISSION:  01/15/2011 DATE OF DISCHARGE:  01/17/2011                              DISCHARGE SUMMARY   PRIMARY CARE PHYSICIAN:  Dr. Bjorn Rice, [place].  DISCHARGE DIAGNOSES: 1. Acute-on-chronic respiratory failure with hypoxemia. 2. Bronchitis/bronchiectasis. 3. Restless legs syndrome. 4. Gastroesophageal reflux disease. 5. Carotid stenosis, status post left carotid endarterectomy. 6. Insomnia. 7. Tobacco abuse. 8. Elevated D-dimer with a negative CT angio. 9. Microscopic hematuria.  DISCHARGE MEDICATIONS: 1. Advair 250/50 one puff inhaled twice a day. 2. Moxifloxacin 400 mg by mouth daily for 7 days. 3. Prednisone 20 mg with a special instruction for steroids tapering,     taking 4 tablets by mouth daily x2 days, then 3 tablets by mouth     daily x2 days, then 2 tablets by mouth daily x2 days, then 1 tablet     by mouth daily x2 days and then half tablet by mouth daily x2 days,     then stop.  The patient will be using this tapering dose for 10     days. 4. Protonix 40 mg by mouth twice a day. 5. Spiriva 80 mcg inhaled daily. 6. Albuterol 1 puff inhaled every 8 hours as needed for shortness of     breath and wheezing. 7. Aspirin enteric coated 81 mg 1 tablet by mouth every evening. 8. Oxycodone slash acetaminophen 5/325, 1-2 tablets by mouth every 4     hours while awake as needed for pain. 9. Pramipexole 0.5 mg 2 tablets by mouth every evening. 10.Unison 1 tablet by mouth daily at bedtime as needed for difficulty     falling asleep and insomnia.  DISPOSITION AND FOLLOWUP:  The patient has been discharged in a stable, improved condition.  Currently not complaining of any shortness of breath and no wheezes.  He also denies any chest pain,  palpitations, nausea, vomiting, or any other complaints.  The patient had received instructions to arrange a followup appointment with primary care physician, Dr. Foy Rice in about 7-10 days.  He also received instructions to stop smoking and to be compliant with his inhalers and medications as prescribed.  It is going to be important at the moment of his followup to repeat a urinalysis to make sure that the patient did not have any further microscopic hematuria as it was one of the abnormal findings on his hospitalization at the moment of admission.  The patient did not have any dysuria, discomfort, and his urinalysis was completely negative, but will be important due to his smoking history, that if he continues to have a microscopic hematuria, he will need to be referred to a urologist in order to rule out any bladder or kidney cancers.  HISTORY OF PRESENT ILLNESS:  For full details, please refer to dictation done by [name] on January 15, 1011, but briefly this is a 70 year old male with a history of COPD, tobacco abuse (that is ongoing) who comes into the hospital complaining of shortness of  breath that had started on Wednesday prior to admission.  He apparently was exposed to propane gas and since then he has shortness of breath and cough with yellow sputum. He denies any fever or chills.  He notes some occasional chest discomfort when he coughs, but denies any palpitations, any nausea, any vomiting, diarrhea, bright red blood per rectum or black stools.  The patient also denies any dysuria.  The patient was apparently in the ER and was treated with several albuterol nebulizers, but he remained wheezing and short of breath.  The patient was admitted for COPD exacerbation.  Note, the patient was tachycardic and had a D-dimer that was elevated.  A CT angio of the chest was done, ruling out any pulmonary embolism.  PROCEDURE PERFORMED DURING THIS HOSPITALIZATION:  The patient had on January 15, 2011 a chest x-ray that demonstrated emphysema with interstitial prominence of the lung bases suggesting interstitial pulmonary edema. He also had a CT angio of the chest, January 16, 2011 that demonstrated no pulmonary embolism, focal areas of consolidation within the right upper lung anteriorly and also at the lingula that were nonspecific but could represent atelectasis versus aspiration versus bronchiectasis/bronchitis.  There was a small amount of mucus within the main bronchi.  No other procedures were performed during this admission. No consultations were made during this admission.  LABORATORY DATA:  The patient has ABG with a pH of 7.428, pCO2 of 36.5, pO2 60, bicarb 24.1.  A CBC with differential demonstrated white blood cells of 14.8, hemoglobin 13.9, platelets 225.  A comprehensive metabolic panel on admission showed a sodium of 130, potassium 3.9, chloride 94, bicarb 25, glucose 110, BUN 9, creatinine 0.79. Urinalysis; negative nitrite, negative leukocytes, small amount of blood with 3-6 red blood cells under microscopy.  Otherwise negative.  Cardiac markers negative x3.  BNP 296, D-dimer 0.68, a TSH 0.893.  Urine culture was negative without any growth.  A lipid profile demonstrated a total cholesterol of 145, triglycerides 66, HDL 57, LDL 75.  At discharge, a CBC with white blood cells of 14, hemoglobin 12, platelets 261.  BMET sodium 135, potassium 3.8, chloride 101, bicarb 27, glucose 128, BUN 13, creatinine 0.69.  HOSPITAL COURSE BY PROBLEM: 1. Acute-on-chronic respiratory failure with hypoxemia secondary to     COPD exacerbation.  The patient received IV steroids, dual     nebulizers, and was also placed on antibiotics.  After receiving     about 48 hours of treatment, the patient's wheezing, shortness of     breath, and discomfort in his chest completely subsided.  At that     moment, for consolidation, he is going to continue p.o. antibiotics     and also tapering of  steroids as instructed and will follow up with     primary care physician for further evaluation and followup.  The     patient had received prescription for his inhalers, specifically     Spiriva, Advair and will also have a prescription for prednisone     and Avelox. 2. Bronchitis and bronchiectasis that is going to be treated with     Avelox for a total of 10 days.  The patient received 3 days of     antibiotics inside the hospital.  Will continue taking it for 7     more days. 3. History of gastroesophageal reflux disease that had been     intermittently, but at this point with the use of prednisone has  pretty much exacerbated.  The patient had been started on Protonix     twice a day and will follow with primary care physician for further     adjustment of his medication as needed. 4. Restless legs syndrome.  We are going to continue Mirapex. 5. Carotid stenosis.  We will continue aspirin. 6. Insomnia.  We are going to continue Unison over the counter as     needed to fall asleep. 7. Tobacco abuse.  The patient received counseling throughout this     hospitalization by myself and also the social worker with tips and     advises for him to follow in order to help him quit.  He will     continue requiring support from primary care physician and has been     instructed to cut down and to use nicotine patch and electronic     cigarettes to help him in this quitting process. 8. Microscopic hematuria.  He has no complaints of dysuria, negative     urinalysis, and also urine culture at this moment.  No further     workup had been done throughout this admission.  The patient will     require to have a repeat urinalysis and if the microscopic     hematuria is still present, he will require a referral to see a     urologist.  DISCHARGE PHYSICAL EXAMINATION:  VITAL SIGNS:  Temperature 97.4, heart rate 86, respiratory rate 19, blood pressure 120/62, oxygen saturation 92% on room  air. GENERAL:  No acute distress. RESPIRATORY:  He was minimal minimally wheezing.  No crackles, no rhonchi. HEART:  Regular rate and rhythm. ABDOMEN:  Soft, nontender, nondistended with positive bowel sounds. EXTREMITIES:  Without any edema. NEUROLOGIC:  Nonfocal.     Darren Randy, MD     CEM/MEDQ  D:  01/17/2011  T:  01/18/2011  Job:  [id number]  cc:   Dr. Foy Rice  Electronically Signed by Vassie Loll MD on 01/29/2011 04:54:09 PM

## 2011-02-24 ENCOUNTER — Ambulatory Visit: Payer: PRIVATE HEALTH INSURANCE | Admitting: Vascular Surgery

## 2011-02-24 ENCOUNTER — Other Ambulatory Visit: Payer: PRIVATE HEALTH INSURANCE

## 2011-02-27 NOTE — H&P (Signed)
NAMEMAYFIELD, SCHOENE NO.:  [id number]  MEDICAL RECORD NO.:  [id number]  LOCATION:  [facility]                         FACILITY:  [facility]  PHYSICIAN:  Massie Maroon, MD        DATE OF BIRTH:  August 05, 1940  DATE OF ADMISSION:  01/15/2011 DATE OF DISCHARGE:                             HISTORY & PHYSICAL   CHIEF COMPLAINT:  Shortness of breath.  HISTORY OF PRESENT ILLNESS:  This is a 70 year old male with a history of COPD, complains of shortness of breath starting Wednesday.  On Wednesday, he apparently was exposed to propane gas.  Since then, he has had shortness of breath and cough with yellow sputum.  He denies any fever or chills.  He notes some occasional chest pain when he coughs, but denies any palpitations, nausea, vomiting, diarrhea, bright red blood per rectum or black stool.  The patient was apparently wheezy in the ER and treated with several albuterol nebs but remained wheezy and short of breath.  The patient will be admitted for COPD exacerbation. Note, the patient was tachycardic and a D-dimer is pending and if positive, we will obtain a CTA chest to rule out pulmonary embolus.  PAST MEDICAL HISTORY: 1. COPD. 2. Restless legs. 3. Acute carotid stenosis status post left CEA.  PAST SURGICAL HISTORY:  Left CEA, surgery to his fingers after trauma.  SOCIAL HISTORY:  The patient is a current smoker, he smokes 1-pack per day x60 years.  He drinks about 3-4 beers a week but this is probably an underestimate according to his wife.  He works as a Surveyor, [employer].  FAMILY HISTORY:  Mother died at age 69 of liver failure, was an alcoholic.  His father died at age 23 and was a smoker.  ALLERGIES:  No known drug allergies.  MEDICATIONS: 1. Unisom 1 p.o. at bedtime p.r.n. insomnia. 2. Pramipexole 0.5 mg 2 p.o. at bedtime for restless legs. 3. Percocet 5/325 mg 1-2 p.o. q.4 h. p.r.n. pain. 4. Enteric-coated aspirin 81 mg p.o. daily. 5. Albuterol HFA 1 puff q.8 h.  p.r.n.  REVIEW OF SYSTEMS:  Negative for all 10 organ systems except for pertinent positives stated above.  PHYSICAL EXAMINATION:  VITAL SIGNS:  Temperature 97.8, pulse 130, blood pressure 147/100, 126/73 in the left arm, the left arm is probably more accurate, and pulse ox 88% on room air. HEENT:  Anicteric. NECK:  No JVD. HEART:  Tachycardic, S1-S2.  No murmurs, gallops, or rubs. LUNGS:  Positive for expiratory wheeze, especially left greater than right. ABDOMEN:  Soft, nontender, nondistended.  Positive bowel sounds. EXTREMITIES:  No cyanosis, clubbing, or edema. SKIN:  No rashes. LYMPH NODES:  No adenopathy. NEUROLOGIC:  Nonfocal.  Cranial nerves II-XII intact.  Reflexes 2+, symmetric, diffuse with downgoing toes bilaterally.  Motor strength 5/5 in all 4 extremities, pinprick intact.  LABORATORY DATA:  PH 7.248, pCO2 36.5, pO2 60 on FIO2 of room air.  WBC 14.8, hemoglobin 13.9, and platelet count is 225.  Sodium 130, potassium 3.9, BUN 9, and creatinine 0.79.  Urinalysis:  Rbc's 3-6.  Chest x-ray:  Emphysema, interstitial pulmonary edema (BNP is pending), bilateral apical scarring is noted.  ASSESSMENT AND  PLAN: 1. Dyspnea secondary to chronic obstructive pulmonary disease     exacerbation:  The patient will be treated with Solu-Medrol 80 mg     IV q.8, Avelox 400 mg IV daily along with Spiriva 1 puff daily, and     albuterol neb q.2 h. p.r.n. wheezing. 2. Tachycardia:  Check CK, CK-MB, troponin I q.6 h. x3 sets.  The     patient will be hydrated with slight normal saline.  I wonder if     his tachycardia may be due to albuterol versus hypoxemia.  We will     check a D-dimer and if positive obtain a CT angio chest to rule out     pulmonary embolism.  If tachycardia is persistent, please order a     cardiac 2-D echo in the a.m.  We will also check a TSH to rule out     any hyperthyroidism. 3. Hyponatremia:  I wonder if this may be a result of his drinking.     If his  sodium continues to be low, please pursue a workup including     serum os and urine os and urine sodium, TSH, and cortisol level. 4. Microscopic hematuria:  He is a smoker.  Please obtain a Urology     consult, if not an inpatient then as an outpatient. 5. Deep venous thrombosis prophylaxis.  Sequential compression     devices.     Massie Maroon, MD     JYK/MEDQ  D:  01/15/2011  T:  01/15/2011  Job:  [id number]  cc:   Molly Maduro A. Nicholos Johns, M.D.  Electronically Signed by Pearson Grippe MD on 02/27/2011 02:27:42 PM

## 2011-03-24 ENCOUNTER — Encounter: Payer: Self-pay | Admitting: Vascular Surgery

## 2011-05-11 ENCOUNTER — Encounter: Payer: Self-pay | Admitting: Vascular Surgery

## 2011-05-12 ENCOUNTER — Other Ambulatory Visit (INDEPENDENT_AMBULATORY_CARE_PROVIDER_SITE_OTHER): Payer: Medicare Other | Admitting: *Deleted

## 2011-05-12 ENCOUNTER — Encounter: Payer: Self-pay | Admitting: Vascular Surgery

## 2011-05-12 ENCOUNTER — Ambulatory Visit (INDEPENDENT_AMBULATORY_CARE_PROVIDER_SITE_OTHER): Payer: Medicare Other | Admitting: Vascular Surgery

## 2011-05-12 VITALS — BP 149/87 | HR 87 | Temp 97.9°F | Ht 69.0 in | Wt 157.0 lb

## 2011-05-12 DIAGNOSIS — I6529 Occlusion and stenosis of unspecified carotid artery: Secondary | ICD-10-CM

## 2011-05-12 DIAGNOSIS — Z48812 Encounter for surgical aftercare following surgery on the circulatory system: Secondary | ICD-10-CM

## 2011-05-12 DIAGNOSIS — F17200 Nicotine dependence, unspecified, uncomplicated: Secondary | ICD-10-CM

## 2011-05-12 NOTE — Progress Notes (Signed)
[facility]  Patient name: [name] MRN: [id number] DOB: Jan 28, 1941 Sex: male  CC: followup of carotid disease  HPI: [name] is a 70 y.o. male who underwent a left carotid endarterectomy on 10/15/2010. He comes in for a 6 month followup visit. Since I saw him last he said no history of stroke, TIAs, expressive or receptive aphasia, or amaurosis fugax. He does occasionally experience dizziness when he looks up suddenly. This happens rarely. His been no significant change in his medical history.  Past Medical History  Diagnosis Date  . Arthritis   . Wheezing   . Sore throat   . Headache   . Carotid artery occlusion   . COPD (chronic obstructive pulmonary disease)   . Lumbar disc disease   . Restless leg syndrome     Family History  Problem Relation Age of Onset  . Heart disease Father     SOCIAL HISTORY: History  Substance Use Topics  . Smoking status: Current Everyday Smoker -- 2.0 packs/day    Types: Cigarettes  . Smokeless tobacco: Not on file  . Alcohol Use: 4.2 oz/week    7 Cans of beer per week    No Known Allergies  Current Outpatient Prescriptions  Medication Sig Dispense Refill  . aspirin EC 81 MG tablet Take 81 mg by mouth daily.        Marland Kitchen oxyCODONE-acetaminophen (PERCOCET) 5-325 MG per tablet Take 1 tablet by mouth every 4 (four) hours as needed.        . pramipexole (MIRAPEX) 1 MG tablet Take 1 mg by mouth at bedtime.        . OxyCODONE HCl (OXYCONTIN PO) Take by mouth.         REVIEW OF SYSTEMS: Arly.Keller ] denotes positive finding; [  ] denotes negative finding CARDIOVASCULAR:  [ ]  chest pain   [ ]  chest pressure   [ ]  palpitations   [ ]  orthopnea   [ ]  dyspnea on exertion   [ ]  claudication   [ ]  rest pain   [ ]  DVT   [ ]  phlebitis PULMONARY:   [ ]  productive cough   [ ]  asthma   [ ]  wheezing NEUROLOGIC:   [ ]  weakness  [ ]  paresthesias  [ ]  aphasia  [ ]  amaurosis  [ ]  dizziness HEMATOLOGIC:   [ ]  bleeding problems    [ ]  clotting disorders MUSCULOSKELETAL:  Arly.Keller ] joint pain right hand   [ ]  joint swelling GASTROINTESTINAL: [ ]   blood in stool  [ ]   hematemesis GENITOURINARY:  [ ]   dysuria  [ ]   hematuria PSYCHIATRIC:  [ ]  history of major depression INTEGUMENTARY:  [ ]  rashes  [ ]  ulcers CONSTITUTIONAL:  [ ]  fever   [ ]  chills  PHYSICAL EXAM: Filed Vitals:   05/12/11 1412  BP: 149/87  Pulse: 87  Temp: 97.9 F (36.6 C)  TempSrc: Oral  Height: 5\' 9"  (1.753 m)  Weight: 157 lb (71.215 kg)  SpO2: 97%   Body mass index is 23.18 kg/(m^2). GENERAL: The patient is a well-nourished male, in no acute distress. The vital signs are documented above. CARDIOVASCULAR: There is a regular rate and rhythm without significant murmur appreciated. I do not detect any carotid bruits. He has no significant lower Shri swelling. PULMONARY: There is good air exchange bilaterally without wheezing or rales. ABDOMEN: Soft and non-tender with normal pitched bowel sounds. Do not palpate an abdominal aortic aneurysm.  MUSCULOSKELETAL: There are no major deformities or cyanosis. NEUROLOGIC: No focal weakness or paresthesias are detected. SKIN: There are no ulcers or rashes noted. PSYCHIATRIC: The patient has a normal affect.  DATA:  I have independently interpreted his carotid duplex scan which shows no evidence of recurrent carotid stenosis on the left. On the right side he has a 40-59% stenosis.  MEDICAL ISSUES: He understands we would not consider right carotid endarterectomy unless the stenosis progressed to greater than 80%. I've ordered a followup carotid duplex scan in 1 year and I'll see him back at that time. He knows to call sooner if he has problems. In the meantime he knows to continue taking his aspirin. In addition the have again discussed the importance of tobacco cessation.  DICKSON,CHRISTOPHER S [facility] [place] Office: [phone]

## 2011-05-19 NOTE — Procedures (Unsigned)
CAROTID DUPLEX EXAM  INDICATION:  Follow up carotid disease.  HISTORY: Diabetes:  No. Cardiac:  No. Hypertension:  No. Smoking:  Yes, 1-1/2 packs per day. Previous Surgery:  Left carotid endarterectomy, 10/15/2010. CV History:  Currently asymptomatic but had recent dizzy spells. Amaurosis Fugax No, Paresthesias No, Hemiparesis No.                                      RIGHT             LEFT Brachial systolic pressure:         127               123 Brachial Doppler waveforms:         Normal            Normal Vertebral direction of flow:        Tardus parvus     Antegrade DUPLEX VELOCITIES (cm/sec) CCA peak systolic                   82                96 ECA peak systolic                   164               87 ICA peak systolic                   187               83 ICA end diastolic                   65                25 PLAQUE MORPHOLOGY:                  Calcific PLAQUE AMOUNT:                      Moderate          None PLAQUE LOCATION:                    ICA, ECA, CCA  IMPRESSION: 1. Right internal carotid artery velocities suggest 40% to 59%     stenosis. 2. Patent to left carotid endarterectomy site without evidence of     restenosis of the internal carotid artery. 3. Abnormal right vertebral artery waveforms.  ___________________________________________ [name]. [name], M.D.  EM/MEDQ  D:  05/12/2011  T:  05/12/2011  Job:  [id number]

## 2011-10-22 ENCOUNTER — Other Ambulatory Visit: Payer: Self-pay | Admitting: Family Medicine

## 2011-10-26 ENCOUNTER — Ambulatory Visit
Admission: RE | Admit: 2011-10-26 | Discharge: 2011-10-26 | Disposition: A | Discharge status: Home / Self Care | Payer: Medicare Other | Source: Ambulatory Visit | Attending: Family Medicine | Admitting: Family Medicine

## 2011-11-16 ENCOUNTER — Encounter: Payer: Self-pay | Admitting: Pulmonary Disease

## 2011-11-16 ENCOUNTER — Ambulatory Visit (INDEPENDENT_AMBULATORY_CARE_PROVIDER_SITE_OTHER): Payer: Medicare Other | Admitting: Pulmonary Disease

## 2011-11-16 VITALS — BP 158/84 | HR 96 | Temp 98.1°F | Ht 69.5 in | Wt 165.2 lb

## 2011-11-16 DIAGNOSIS — J449 Chronic obstructive pulmonary disease, unspecified: Secondary | ICD-10-CM | POA: Insufficient documentation

## 2011-11-16 DIAGNOSIS — R0989 Other specified symptoms and signs involving the circulatory and respiratory systems: Secondary | ICD-10-CM

## 2011-11-16 DIAGNOSIS — R05 Cough: Secondary | ICD-10-CM | POA: Insufficient documentation

## 2011-11-16 NOTE — Assessment & Plan Note (Signed)
The patient has a chronic cough that is very difficult to determine if upper airway in origin from his ongoing smoking and also known reflux disease.  Or, whether this is related to ongoing airway inflammation from his smoking as well.  I have stressed to him the importance of total smoking cessation.  We'll also treat for airways disease and see if his cough improves.

## 2011-11-16 NOTE — Progress Notes (Signed)
  Subjective:    Patient ID: [name], male    DOB: [birth year], 71 y.o.   MRN: [id number]  HPI The patient is a 71 year old male who I been asked to see for dyspnea on exertion.  He has had progressive shortness of breath over the last one year, and has also noted an ongoing cough with classic cough syncope on occasion.  When questioned about walking distances, the patient feels that he can walk many blocks without getting short of breath.  The wife disagrees with this assessment.  He does not get winded bringing groceries in from the car, but will if he walks up one flight of stairs.  The patient has a long-standing history of tobacco use, and continues to smoke.  He notices that when he goes a longer period of time without smoking, his cough improved.  His cough is primarily dry and hacking in nature, but will sometimes produce slimy mucus.  The patient is on a proton pump inhibitor for reflux disease, but states that he will sometimes regurgitate food at the end of a meal.  He has had a recent chest x-ray that shows no acute process.  Review of Systems  Constitutional: Negative for fever and unexpected weight change.  HENT: Positive for congestion, sore throat and sneezing. Negative for ear pain, nosebleeds, rhinorrhea, trouble swallowing, dental problem, postnasal drip and sinus pressure.   Eyes: Negative for redness and itching.  Respiratory: Positive for cough and shortness of breath. Negative for chest tightness and wheezing.   Cardiovascular: Negative for palpitations and leg swelling.  Gastrointestinal: Negative for nausea and vomiting.  Genitourinary: Negative for dysuria.  Musculoskeletal: Positive for joint swelling.  Skin: Negative for rash.  Neurological: Positive for headaches.  Hematological: Does not bruise/bleed easily.  Psychiatric/Behavioral: Positive for dysphoric mood. The patient is not nervous/anxious.        Objective:   Physical Exam Constitutional:  Thin  male, no acute distress  HENT:  Nares patent without discharge  Oropharynx without exudate, palate and uvula are normal  Eyes:  Perrla, eomi, no scleral icterus  Neck:  No JVD, no TMG  Cardiovascular:  Normal rate, regular rhythm, no rubs or gallops.  No murmurs        Intact distal pulses  Pulmonary :  Normal breath sounds, no stridor or respiratory distress   +wheezing and rhonchi diffusely.  Abdominal:  Soft, nondistended, bowel sounds present.  No tenderness noted.   Musculoskeletal:  No lower extremity edema noted.  Lymph Nodes:  No cervical lymphadenopathy noted  Skin:  No cyanosis noted  Neurologic:  Alert, appropriate, moves all 4 extremities without obvious deficit.         Assessment & Plan:

## 2011-11-16 NOTE — Patient Instructions (Signed)
advair 250/50 one inhalation each am and pm everyday.  Rinse mouth well after using each time, gargle and spit. Can use albuterol for emergencies only Stop smoking.  This is the key to stopping your cough and improving your breathing.   Will see you back in 3 weeks, and will do breathing studies the same day.

## 2011-11-16 NOTE — Assessment & Plan Note (Signed)
The patient has significant wheezing and rhonchi on exam today consistent with bronchospasm.  I suspect this is related to significant airway inflammation from his ongoing smoking.  I would like to start him on a LABA/ICS and see if he improves.  Will also do pulmonary function studies at the next visit to verify whether he may have COPD or not.

## 2011-12-10 ENCOUNTER — Encounter: Payer: Self-pay | Admitting: Pulmonary Disease

## 2011-12-10 ENCOUNTER — Ambulatory Visit (INDEPENDENT_AMBULATORY_CARE_PROVIDER_SITE_OTHER): Payer: Medicare Other | Admitting: Pulmonary Disease

## 2011-12-10 VITALS — BP 142/80 | HR 77 | Temp 98.3°F | Ht 70.0 in | Wt 165.0 lb

## 2011-12-10 DIAGNOSIS — R05 Cough: Secondary | ICD-10-CM

## 2011-12-10 DIAGNOSIS — J449 Chronic obstructive pulmonary disease, unspecified: Secondary | ICD-10-CM

## 2011-12-10 LAB — PULMONARY FUNCTION TEST

## 2011-12-10 MED ORDER — BUDESONIDE-FORMOTEROL FUMARATE 160-4.5 MCG/ACT IN AERO: 2.0000 | INHALATION_SPRAY | Freq: Two times a day (BID) | RESPIRATORY_TRACT | Status: DC

## 2011-12-10 MED ORDER — ALBUTEROL SULFATE HFA 108 (90 BASE) MCG/ACT IN AERS: 2.0000 | INHALATION_SPRAY | Freq: Four times a day (QID) | RESPIRATORY_TRACT | Status: DC | PRN

## 2011-12-10 NOTE — Progress Notes (Signed)
Addended by: Michel Bickers A on: 12/10/2011 02:52 PM   Modules accepted: Orders

## 2011-12-10 NOTE — Progress Notes (Signed)
  Subjective:    Patient ID: [name], male    DOB: 1940/11/14, 71 y.o.   MRN: [id number]  HPI The patient comes in today for followup of his recent PFTs.  He was found to have moderate airflow obstruction, and I have reviewed the studies with him in detail.  He was started on Advair at the last visit, but has not seen a big difference in his breathing despite taking compliantly.  Unfortunately, he continues to smoke quite a bit.   Review of Systems  Constitutional: Negative.  Negative for fever and unexpected weight change.  HENT: Negative.  Negative for ear pain, nosebleeds, congestion, sore throat, rhinorrhea, sneezing, trouble swallowing, dental problem, postnasal drip and sinus pressure.   Eyes: Negative.  Negative for redness and itching.  Respiratory: Positive for cough and shortness of breath. Negative for chest tightness and wheezing.   Cardiovascular: Negative.  Negative for palpitations and leg swelling.  Gastrointestinal: Negative.  Negative for nausea and vomiting.  Genitourinary: Negative.  Negative for dysuria.  Musculoskeletal: Negative.  Negative for joint swelling.  Skin: Negative.  Negative for rash.  Neurological: Positive for numbness. Negative for headaches.       Burning in hands  Hematological: Negative.  Does not bruise/bleed easily.  Psychiatric/Behavioral: Negative.  Negative for dysphoric mood. The patient is not nervous/anxious.        Objective:   Physical Exam Thin male in no acute distress Nose without purulence or discharge noted Chest with decreased breath sounds, but no wheezing Heart exam with regular rate and rhythm Lower extremities without edema, no cyanosis Alert and oriented, moves all 4 extremities.       Assessment & Plan:

## 2011-12-10 NOTE — Progress Notes (Signed)
PFT done today. 

## 2011-12-10 NOTE — Patient Instructions (Addendum)
Stop advair.  Will try symbicort 160/4.5  2 puffs every am and pm.  Rinse mouth well.  Can use albuterol INHALER  2 puffs up to every 6hrs if needed for rescue/emergencies.  Do not use nebulizer unless having a real hard time. Stop smoking.  This is the key to you getting better. followup with me in 6mos, or sooner if having issues.

## 2011-12-10 NOTE — Assessment & Plan Note (Signed)
The patient has moderate COPD by his recent pulmonary function studies, even taking Advair compliantly.  Unfortunately, he is continuing to smoke.  I have had a long discussion with him about COPD, and how his flows will continue to worsen if he does not stop smoking.  I have also told him that his numbers may actually improve if he is able to quit smoking, allowing his airway inflammation to resolve.  He did not see a big difference in his breathing with Advair, and therefore I would like to try him on symbicort, which has a LABA that has a faster onset than Serevent.  I see no reason to escalate his medications further, such as adding Spiriva, unless he is able to make a better effort at smoking cessation.  He will work on this.

## 2012-04-17 ENCOUNTER — Other Ambulatory Visit: Payer: Self-pay | Admitting: *Deleted

## 2012-04-17 DIAGNOSIS — Z48812 Encounter for surgical aftercare following surgery on the circulatory system: Secondary | ICD-10-CM

## 2012-04-17 DIAGNOSIS — I6529 Occlusion and stenosis of unspecified carotid artery: Secondary | ICD-10-CM

## 2012-04-18 ENCOUNTER — Encounter: Payer: Self-pay | Admitting: Neurosurgery

## 2012-04-19 ENCOUNTER — Other Ambulatory Visit (INDEPENDENT_AMBULATORY_CARE_PROVIDER_SITE_OTHER): Payer: Medicare Other | Admitting: *Deleted

## 2012-04-19 ENCOUNTER — Ambulatory Visit (INDEPENDENT_AMBULATORY_CARE_PROVIDER_SITE_OTHER): Payer: Medicare Other | Admitting: Neurosurgery

## 2012-04-19 ENCOUNTER — Encounter: Payer: Self-pay | Admitting: Neurosurgery

## 2012-04-19 VITALS — BP 141/88 | HR 79 | Resp 20 | Ht 69.5 in | Wt 166.0 lb

## 2012-04-19 DIAGNOSIS — I6529 Occlusion and stenosis of unspecified carotid artery: Secondary | ICD-10-CM

## 2012-04-19 DIAGNOSIS — Z48812 Encounter for surgical aftercare following surgery on the circulatory system: Secondary | ICD-10-CM

## 2012-04-19 NOTE — Progress Notes (Signed)
[facility] [place] Carotid Office Note  CC: Carotid surveillance Referring Physician: [name]  History of Present Illness: 71 year old male patient of Dr. [name] status post left CEA in April 2012. The patient denies any signs or symptoms of CVA, TIA, amaurosis fugax or any neural deficit. Patient denies any dizziness and has full function in both upper extremities without any paresthesias.  Past Medical History  Diagnosis Date  . Arthritis   . Wheezing   . Sore throat   . Headache   . Carotid artery occlusion   . COPD (chronic obstructive pulmonary disease)   . Lumbar disc disease   . Restless leg syndrome   . Allergic rhinitis     ROS: [x]  Positive   [ ]  Denies    General: [ ]  Weight loss, [ ]  Fever, [ ]  chills Neurologic: [ ]  Dizziness, [ ]  Blackouts, [ ]  Seizure [ ]  Stroke, [ ]  "Mini stroke", [ ]  Slurred speech, [ ]  Temporary blindness; [ ]  weakness in arms or legs, [ ]  Hoarseness Cardiac: [ ]  Chest pain/pressure, [ ]  Shortness of breath at rest [ ]  Shortness of breath with exertion, [ ]  Atrial fibrillation or irregular heartbeat Vascular: [ ]  Pain in legs with walking, [ ]  Pain in legs at rest, [ ]  Pain in legs at night,  [ ]  Non-healing ulcer, [ ]  Blood clot in vein/DVT,   Pulmonary: [ ]  Home oxygen, [ ]  Productive cough, [ ]  Coughing up blood, [ ]  Asthma,  [ ]  Wheezing Musculoskeletal:  [ ]  Arthritis, [ ]  Low back pain, [ ]  Joint pain Hematologic: [ ]  Easy Bruising, [ ]  Anemia; [ ]  Hepatitis Gastrointestinal: [ ]  Blood in stool, [ ]  Gastroesophageal Reflux/heartburn, [ ]  Trouble swallowing Urinary: [ ]  chronic Kidney disease, [ ]  on HD - [ ]  MWF or [ ]  TTHS, [ ]  Burning with urination, [ ]  Difficulty urinating Skin: [ ]  Rashes, [ ]  Wounds Psychological: [ ]  Anxiety, [ ]  Depression   Social History History  Substance Use Topics  . Smoking status: Current Every Day Smoker -- 1.5 packs/day for 55 years    Types: Cigarettes  . Smokeless tobacco:  Not on file  . Alcohol Use: 4.2 oz/week    7 Cans of beer per week    Family History Family History  Problem Relation Age of Onset  . Heart disease Father     MI    No Known Allergies  Current Outpatient Prescriptions  Medication Sig Dispense Refill  . albuterol (PROAIR HFA) 108 (90 BASE) MCG/ACT inhaler Inhale 2 puffs into the lungs every 6 (six) hours as needed for shortness of breath.  1 Inhaler  6  . albuterol (PROVENTIL) (2.5 MG/3ML) 0.083% nebulizer solution Take 2.5 mg by nebulization 4 (four) times daily as needed.      . budesonide-formoterol (SYMBICORT) 160-4.5 MCG/ACT inhaler Inhale 2 puffs into the lungs 2 (two) times daily.  1 Inhaler  6  . omeprazole (PRILOSEC) 40 MG capsule Take 40 mg by mouth daily.      . pramipexole (MIRAPEX) 0.5 MG tablet Take 1 mg by mouth daily.        Physical Examination  There were no vitals filed for this visit.  There is no height or weight on file to calculate BMI.  General:  WDWN in NAD Gait: Normal HEENT: WNL Eyes: Pupils equal Pulmonary: normal non-labored breathing , without Rales, rhonchi,  wheezing Cardiac: RRR, without  Murmurs, rubs or gallops; Abdomen:  soft, NT, no masses Skin: no rashes, ulcers noted  Vascular Exam Pulses: 3+ radial pulses bilaterally Carotid bruits: Carotid pulses to auscultation no bruits heard Extremities without ischemic changes, no Gangrene , no cellulitis; no open wounds;  Musculoskeletal: no muscle wasting or atrophy   Neurologic: A&O X 3; Appropriate Affect ; SENSATION: normal; MOTOR FUNCTION:  moving all extremities equally. Speech is fluent/normal  Non-Invasive Vascular Imaging CAROTID DUPLEX 04/19/2012  Right ICA 60 - 79 % stenosis Left ICA 0 - 19% stenosis   ASSESSMENT/PLAN: Asymptomatic patient with advancing right ICA stenosis. The patient will followup in 6 months with repeat carotid duplex. [name] questions were encouraged and answered, he is in agreement with this plan.  The patient knows the signs and symptoms of CVA and knows to report to the nearest emergency department should that occur.  Lauree Chandler ANP   Clinic MD: [name]

## 2012-05-12 ENCOUNTER — Other Ambulatory Visit: Payer: Self-pay | Admitting: Gastroenterology

## 2012-06-16 ENCOUNTER — Ambulatory Visit: Payer: Medicare Other | Admitting: Pulmonary Disease

## 2012-06-23 ENCOUNTER — Other Ambulatory Visit: Payer: Self-pay | Admitting: Pulmonary Disease

## 2012-06-23 MED ORDER — ALBUTEROL SULFATE HFA 108 (90 BASE) MCG/ACT IN AERS: 2.0000 | INHALATION_SPRAY | Freq: Four times a day (QID) | RESPIRATORY_TRACT | Status: DC | PRN

## 2012-07-07 ENCOUNTER — Other Ambulatory Visit (HOSPITAL_BASED_OUTPATIENT_CLINIC_OR_DEPARTMENT_OTHER): Payer: Self-pay | Admitting: Family Medicine

## 2012-07-07 DIAGNOSIS — R918 Other nonspecific abnormal finding of lung field: Secondary | ICD-10-CM

## 2012-07-10 ENCOUNTER — Ambulatory Visit (HOSPITAL_BASED_OUTPATIENT_CLINIC_OR_DEPARTMENT_OTHER)
Admission: RE | Admit: 2012-07-10 | Discharge: 2012-07-10 | Disposition: A | Discharge status: Home / Self Care | Payer: Medicare Other | Source: Ambulatory Visit | Attending: Family Medicine | Admitting: Family Medicine

## 2012-07-10 ENCOUNTER — Encounter (HOSPITAL_BASED_OUTPATIENT_CLINIC_OR_DEPARTMENT_OTHER): Payer: Self-pay

## 2012-07-10 DIAGNOSIS — I2510 Atherosclerotic heart disease of native coronary artery without angina pectoris: Secondary | ICD-10-CM | POA: Insufficient documentation

## 2012-07-10 DIAGNOSIS — R222 Localized swelling, mass and lump, trunk: Secondary | ICD-10-CM | POA: Insufficient documentation

## 2012-07-10 DIAGNOSIS — R918 Other nonspecific abnormal finding of lung field: Secondary | ICD-10-CM

## 2012-07-10 MED ORDER — IOHEXOL 300 MG/ML  SOLN
80.0000 mL | Freq: Once | INTRAMUSCULAR | Status: AC | PRN
  Administered 2012-07-10: 80 mL via INTRAVENOUS

## 2012-07-12 DIAGNOSIS — J189 Pneumonia, unspecified organism: Secondary | ICD-10-CM

## 2012-07-12 HISTORY — DX: Pneumonia, unspecified organism: J18.9

## 2012-07-14 ENCOUNTER — Other Ambulatory Visit (HOSPITAL_COMMUNITY): Payer: Self-pay | Admitting: Family Medicine

## 2012-07-14 DIAGNOSIS — R918 Other nonspecific abnormal finding of lung field: Secondary | ICD-10-CM

## 2012-07-17 ENCOUNTER — Encounter: Payer: Self-pay | Admitting: *Deleted

## 2012-07-17 NOTE — Progress Notes (Signed)
Spoke with Carollee Herter at [facility].  Notified her that Dr. Shelle Iron and I communicated about pt and he will set up for tissue dx.

## 2012-07-19 ENCOUNTER — Encounter (HOSPITAL_COMMUNITY): Payer: Self-pay

## 2012-07-19 ENCOUNTER — Encounter: Payer: Self-pay | Admitting: *Deleted

## 2012-07-19 ENCOUNTER — Encounter: Payer: Self-pay | Admitting: Pulmonary Disease

## 2012-07-19 ENCOUNTER — Ambulatory Visit (INDEPENDENT_AMBULATORY_CARE_PROVIDER_SITE_OTHER): Payer: Medicare Other | Admitting: Pulmonary Disease

## 2012-07-19 ENCOUNTER — Encounter (HOSPITAL_COMMUNITY)
Admission: RE | Admit: 2012-07-19 | Discharge: 2012-07-19 | Disposition: A | Discharge status: Home / Self Care | Payer: Medicare Other | Source: Ambulatory Visit | Attending: Family Medicine | Admitting: Family Medicine

## 2012-07-19 VITALS — BP 120/86 | HR 88 | Temp 98.8°F | Ht 69.5 in | Wt 172.6 lb

## 2012-07-19 DIAGNOSIS — J449 Chronic obstructive pulmonary disease, unspecified: Secondary | ICD-10-CM

## 2012-07-19 DIAGNOSIS — J984 Other disorders of lung: Secondary | ICD-10-CM | POA: Insufficient documentation

## 2012-07-19 DIAGNOSIS — R222 Localized swelling, mass and lump, trunk: Secondary | ICD-10-CM

## 2012-07-19 DIAGNOSIS — R918 Other nonspecific abnormal finding of lung field: Secondary | ICD-10-CM

## 2012-07-19 LAB — GLUCOSE, CAPILLARY: Glucose-Capillary: 93 mg/dL (ref 70–99)

## 2012-07-19 MED ORDER — FLUDEOXYGLUCOSE F - 18 (FDG) INJECTION
19.6000 | Freq: Once | INTRAVENOUS | Status: AC | PRN
  Administered 2012-07-19: 19.6 via INTRAVENOUS

## 2012-07-19 NOTE — Progress Notes (Signed)
  Subjective:    Patient ID: [name], male    DOB: 1941/01/15, 72 y.o.   MRN: [id number]  HPI The patient comes in today for followup of his known COPD, and also to review his recent CAT scan and PET scan.  The patient did not see any change in his breathing with symbicort, and resulted in a "dry and irritated throat".  Unfortunately the patient continues to smoke.  He has had a recent chest x-ray that showed a new lung mass, and on CT verified the presence of a 3 cm posterior segment of right upper lobe mass with postobstructive changes.  There was no definite lymphadenopathy.  He then underwent PET scanning, where his mass did light up extensively, along with mild uptake peripherally in the postobstructive changes.  He did not have any obvious PET avid lymphadenopathy.   Review of Systems  Constitutional: Negative for fever and unexpected weight change.  HENT: Positive for rhinorrhea. Negative for ear pain, nosebleeds, congestion, sore throat, sneezing, trouble swallowing, dental problem, postnasal drip and sinus pressure.   Eyes: Negative for redness and itching.  Respiratory: Positive for cough ( productive white mucus), shortness of breath and wheezing. Negative for chest tightness.   Cardiovascular: Positive for leg swelling ( feet swelling since CT scan). Negative for palpitations.  Gastrointestinal: Positive for constipation. Negative for nausea and vomiting.  Genitourinary: Negative for dysuria.  Musculoskeletal: Negative for joint swelling.  Skin: Negative for rash.  Neurological: Positive for headaches.  Hematological: Does not bruise/bleed easily.  Psychiatric/Behavioral: Positive for dysphoric mood. The patient is not nervous/anxious.        Objective:   Physical Exam Well-developed male in no acute distress Nose without purulent discharge noted Oropharynx clear Neck without lymphadenopathy or thyromegaly Chest with decreased breath sounds throughout and a few  rhonchi Cardiac exam was regular rate and rhythm Lower extremities without significant edema, no cyanosis Alert and oriented, moves all 4 extremities.       Assessment & Plan:

## 2012-07-19 NOTE — Progress Notes (Signed)
Spoke with referring office, Carollee Herter regarding referral to Dr. Shelle Iron for tissue dx

## 2012-07-19 NOTE — Assessment & Plan Note (Signed)
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  He does not have any obvious nodal metastases, but I'm concerned about the proximity to his right hilum and vasculature.  He also has significant underlying obstructive airways disease, coronary disease, and continues to smoke.  It is unclear whether he is a surgical candidate or not at this point.  I would like to do bronchoscopy to obtain a tissue diagnosis, and then present his case to multidisciplinary cancer conference.

## 2012-07-19 NOTE — Assessment & Plan Note (Signed)
The patient did not see any improvement on symbicort, and discontinued this.  However he continues to smoke.  I would like to try him on arcapta since I suspect the inhaled steroid aggravated his throat and led to his complaints.  I have stressed to him the most important thing he can do is stop smoking.

## 2012-07-19 NOTE — Patient Instructions (Addendum)
Will set up for bronchoscopy to get a diagnosis for you. Stop smoking!!  This is my most important piece of advice. Start arcapta one inhalation each am everyday.  Rinse mouth well.  Do not use albuterol by neb or inhaler unless needed for rescue.

## 2012-07-21 ENCOUNTER — Encounter (HOSPITAL_COMMUNITY): Payer: Self-pay

## 2012-07-24 ENCOUNTER — Ambulatory Visit (HOSPITAL_COMMUNITY)
Admission: RE | Admit: 2012-07-24 | Discharge: 2012-07-24 | Disposition: A | Discharge status: Home / Self Care | Payer: Medicare Other | Source: Ambulatory Visit | Attending: Pulmonary Disease | Admitting: Pulmonary Disease

## 2012-07-24 ENCOUNTER — Encounter (HOSPITAL_COMMUNITY): Payer: Self-pay | Admitting: Radiology

## 2012-07-24 ENCOUNTER — Encounter (HOSPITAL_COMMUNITY)
Admission: RE | Disposition: A | Discharge status: Home / Self Care | Payer: Self-pay | Source: Ambulatory Visit | Attending: Pulmonary Disease

## 2012-07-24 DIAGNOSIS — R918 Other nonspecific abnormal finding of lung field: Secondary | ICD-10-CM

## 2012-07-24 DIAGNOSIS — R222 Localized swelling, mass and lump, trunk: Secondary | ICD-10-CM | POA: Insufficient documentation

## 2012-07-24 HISTORY — PX: VIDEO BRONCHOSCOPY: SHX5072

## 2012-07-24 SURGERY — BRONCHOSCOPY, WITH FLUOROSCOPY
Anesthesia: Moderate Sedation | Laterality: Bilateral

## 2012-07-24 MED ORDER — LIDOCAINE HCL 2 % EX GEL
CUTANEOUS | Status: DC | PRN
  Administered 2012-07-24: 1

## 2012-07-24 MED ORDER — MIDAZOLAM HCL 10 MG/2ML IJ SOLN
INTRAMUSCULAR | Status: AC
  Filled 2012-07-24: qty 4

## 2012-07-24 MED ORDER — MEPERIDINE HCL 100 MG/ML IJ SOLN
INTRAMUSCULAR | Status: AC
  Filled 2012-07-24: qty 2

## 2012-07-24 MED ORDER — MIDAZOLAM HCL 10 MG/2ML IJ SOLN
INTRAMUSCULAR | Status: DC | PRN
  Administered 2012-07-24: 5 mg via INTRAVENOUS
  Administered 2012-07-24: 2.5 mg via INTRAVENOUS

## 2012-07-24 MED ORDER — PHENYLEPHRINE HCL 0.25 % NA SOLN
NASAL | Status: DC | PRN
  Administered 2012-07-24: 2 via NASAL

## 2012-07-24 MED ORDER — LIDOCAINE HCL 1 % IJ SOLN
INTRAMUSCULAR | Status: DC | PRN
  Administered 2012-07-24: 6 mL via RESPIRATORY_TRACT

## 2012-07-24 MED ORDER — MEPERIDINE HCL 25 MG/ML IJ SOLN
INTRAMUSCULAR | Status: DC | PRN
  Administered 2012-07-24: 50 mg via INTRAVENOUS

## 2012-07-24 NOTE — H&P (Signed)
  Pt seen and discussed procedure with risks and benefits.  He is stable for procedure.

## 2012-07-24 NOTE — Op Note (Signed)
Dictation #:  [id number]

## 2012-07-24 NOTE — Progress Notes (Signed)
Bronch w/ video intervention performed.  Bronchial Washing intervention performed, bronchial brushing intervention performed, Bronchial biopsy intervention performed, Bronchial needle intervention performed.

## 2012-07-25 ENCOUNTER — Encounter (HOSPITAL_COMMUNITY): Payer: Self-pay | Admitting: Pulmonary Disease

## 2012-07-25 NOTE — Op Note (Signed)
NAME:  INIKO, ROBLES               ACCOUNT NO.:  [id number]  MEDICAL RECORD NO.:  [id number]  LOCATION:  RESP                         FACILITY:  [facility]  PHYSICIAN:  Barbaraann Share, MD,FCCPDATE OF BIRTH:  July [birth year] 1942  DATE OF PROCEDURE:  07/24/2012 DATE OF DISCHARGE:                              OPERATIVE REPORT   PROCEDURE:  Flexible fiberoptic bronchoscopy with video.  INDICATION FOR THE PROCEDURE:  Right upper lobe mass of unknown origin.  OPERATOR:  Barbaraann Share, MD,FCCP  ANESTHESIA:  Versed 7.5 mg IV, Demerol 50 mg IV, and also topical 1% lidocaine to vocal cords and airways during the procedure.  DESCRIPTION:  After obtaining informed consent and under close cardiopulmonary monitoring, the above preop anesthesia was given and the fiberoptic scope was passed to the right naris and into the posterior pharynx.  There were no lesions or other abnormalities seen.  The scope was then passed into the trachea where it was examined along its entire length down to the level of the carina, all of which was normal.  The left tracheobronchial tree was then examined to the subsegmental level with no endobronchial abnormality being found.  Attention was then paid to the right tracheobronchial tree where the right lower lobe and middle lobe bronchi appeared normal to the subsegmental level.  The right upper lobe bronchus had normal anterior segments and apical segments, however, the posterior segment of the right upper lobe was nearly occluded with an endobronchial mass.  I was unable to see distal to the abnormality. Bronchial washes, brushes, and endobronchial pinch biopsies were done without complications.  Transbronchial needle aspirations were also done of the mass in the surrounding area.  Overall, the patient tolerated the procedure well and there were no complications.  Good hemostasis was maintained throughout the procedure.     Barbaraann Share, MD,FCCP     [facility]/MEDQ  D:   07/24/2012  T:  07/25/2012  Job:  [id number]

## 2012-07-26 ENCOUNTER — Telehealth: Payer: Self-pay | Admitting: Pulmonary Disease

## 2012-07-26 LAB — CULTURE, BAL-QUANTITATIVE W GRAM STAIN

## 2012-07-26 NOTE — Telephone Encounter (Signed)
Pt aware and will come for OV on Thurs., 07/27/12 @ 9:30 to discuss results.

## 2012-07-26 NOTE — Telephone Encounter (Signed)
See if he can come in tomorrow to review path from bronch

## 2012-07-26 NOTE — Telephone Encounter (Signed)
Spoke with patients wife, [name]. [name] is requesting results from bronch done 07/24/12.  Dr. Shelle Iron please advise, thank you

## 2012-07-27 ENCOUNTER — Encounter: Payer: Self-pay | Admitting: *Deleted

## 2012-07-27 ENCOUNTER — Ambulatory Visit (INDEPENDENT_AMBULATORY_CARE_PROVIDER_SITE_OTHER): Payer: Medicare Other | Admitting: Pulmonary Disease

## 2012-07-27 ENCOUNTER — Encounter: Payer: Self-pay | Admitting: Pulmonary Disease

## 2012-07-27 VITALS — BP 130/90 | HR 107 | Temp 98.2°F | Ht 69.0 in | Wt 168.0 lb

## 2012-07-27 DIAGNOSIS — R918 Other nonspecific abnormal finding of lung field: Secondary | ICD-10-CM

## 2012-07-27 DIAGNOSIS — J449 Chronic obstructive pulmonary disease, unspecified: Secondary | ICD-10-CM

## 2012-07-27 DIAGNOSIS — R222 Localized swelling, mass and lump, trunk: Secondary | ICD-10-CM

## 2012-07-27 MED ORDER — INDACATEROL MALEATE 75 MCG IN CAPS: 1.0000 | ORAL_CAPSULE | Freq: Every day | RESPIRATORY_TRACT | Status: DC

## 2012-07-27 MED ORDER — TIOTROPIUM BROMIDE MONOHYDRATE 18 MCG IN CAPS: 18.0000 ug | ORAL_CAPSULE | Freq: Every day | RESPIRATORY_TRACT | Status: DC

## 2012-07-27 NOTE — Patient Instructions (Addendum)
Stay on arcapta one inhalation each am, and will start on spiriva one inhalation each am as well.  Stop smoking.  This is very important.  Will refer you to a cancer specialist for evaluation. Would like to see you back in 6 weeks to check on your breathing.

## 2012-07-27 NOTE — Assessment & Plan Note (Signed)
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  I am concerned about possible surgery given his underlying lung disease, heart disease, and the proximity of his cancer to the mediastinum and hilar areas.  I would like to refer him to a medical oncologist, and also present his case to cancer conference.  I told the patient that he must stop smoking, and would like to maximize his lung function with an aggressive bronchodilator regimen.

## 2012-07-27 NOTE — Assessment & Plan Note (Signed)
The patient has moderate airflow obstruction, but continues to smoke.  I think he needs to stay on an aggressive bronchodilator regimen, and I also discussed with him smoking cessation.  Will maintain him on arcapta and Spiriva until we can figure out how to approach his lung cancer diagnosis.  I suspect from a pulmonary standpoint he is borderline for possible surgery.

## 2012-07-27 NOTE — Progress Notes (Signed)
Faxed referral to [facility] 

## 2012-07-27 NOTE — Progress Notes (Signed)
  Subjective:    Patient ID: [name], male    DOB: 1940-10-01, 72 y.o.   MRN: [id number]  HPI Patient comes in today for followup after his recent bronchoscopy for a right upper lobe mass with postobstructive changes.  His bronchoscopy showed obstruction of the posterior segment of the right upper lobe, and biopsies revealed adenocarcinoma.  [name] PET scan did not show positive lymphadenopathy, nor evidence for disease spread outside the chest.  The patient unfortunately continues to smoke.   Review of Systems  Constitutional: Negative for fever and unexpected weight change.  HENT: Negative for ear pain, nosebleeds, congestion, sore throat, rhinorrhea, sneezing, trouble swallowing, dental problem, postnasal drip and sinus pressure.   Eyes: Negative for redness and itching.  Respiratory: Positive for cough ( with dry mouth), chest tightness, shortness of breath and wheezing.   Cardiovascular: Positive for chest pain (x 1 day). Negative for palpitations and leg swelling.  Gastrointestinal: Negative for nausea and vomiting.  Genitourinary: Negative for dysuria.  Musculoskeletal: Negative for joint swelling.  Skin: Negative for rash.  Neurological: Negative for headaches.  Hematological: Does not bruise/bleed easily.  Psychiatric/Behavioral: Positive for dysphoric mood. The patient is nervous/anxious.        Objective:   Physical Exam Thin male in nad Nose without purulence or discharge noted Neck without lymphadenopathy or thyromegaly Lower extremities without edema, cyanosis Alert and oriented, moves all 4 extremities.       Assessment & Plan:

## 2012-07-28 ENCOUNTER — Telehealth: Payer: Self-pay | Admitting: *Deleted

## 2012-07-28 NOTE — Telephone Encounter (Signed)
Spoke with pt wife regarding appt time and place.  She verbalized understanding of time and place

## 2012-08-03 ENCOUNTER — Other Ambulatory Visit: Payer: Self-pay

## 2012-08-03 ENCOUNTER — Encounter: Payer: Self-pay | Admitting: Cardiothoracic Surgery

## 2012-08-03 ENCOUNTER — Ambulatory Visit
Admission: RE | Admit: 2012-08-03 | Discharge: 2012-08-03 | Disposition: A | Discharge status: Home / Self Care | Payer: Medicare Other | Source: Ambulatory Visit | Attending: Cardiothoracic Surgery | Admitting: Cardiothoracic Surgery

## 2012-08-03 ENCOUNTER — Institutional Professional Consult (permissible substitution) (INDEPENDENT_AMBULATORY_CARE_PROVIDER_SITE_OTHER): Payer: Medicare Other | Admitting: Cardiothoracic Surgery

## 2012-08-03 VITALS — BP 146/81 | HR 96 | Resp 20 | Ht 69.0 in | Wt 165.0 lb

## 2012-08-03 DIAGNOSIS — R51 Headache: Secondary | ICD-10-CM

## 2012-08-03 DIAGNOSIS — C3490 Malignant neoplasm of unspecified part of unspecified bronchus or lung: Secondary | ICD-10-CM

## 2012-08-03 MED ORDER — GADOBENATE DIMEGLUMINE 529 MG/ML IV SOLN
15.0000 mL | Freq: Once | INTRAVENOUS | Status: AC | PRN
  Administered 2012-08-03: 15 mL via INTRAVENOUS

## 2012-08-03 NOTE — Progress Notes (Signed)
PCP is FRIED, [name], MD Referring Provider is Clance, Maree Krabbe, MD  Chief Complaint  Patient presents with  . Lung Cancer    eval for possible surgery     HPI: 72 year old Caucasian male active smoker with COPD fell and injured some right ribs and a chest x-ray showed a mass in the right upper lung. A followup CT scan showed this to be a 3.0 cm spiculated mass with some postobstructive pneumonitis. The patient had a PET scan which showed this to be intensely hypermetabolic without evidence of active mediastinal involvement or distant metastases. Bronchoscopy was performed by his pulmonologist and a biopsy was consistent with adenocarcinoma lung. Poor he function test shows FEV1 to be 1.9 or 683% of predicted DLCO of 86%. The patient has shortness of breath with exertion and still actively smokes between 1 and 3 packs of cigarettes a day. His pulmonologist felt he was a borderline candidate for surgical resection for which a surgical opinion.  Ambulating the patient in the office hallways demonstrates his oxygen saturation on room air does not drop but remains 96-98%. His heart rate increases from 94-102 and his respiratory rate increases from 12 to 26 per minute  The patient has a history of cardiac disease and states he had a stress tes at [facility] cardiology which was abnormal--  he has been placed on Imdur.  The patient had a left carotid endarterectomy approximately 3 years ago without previous history of stroke  Past Medical History  Diagnosis Date  . Arthritis   . Wheezing   . Sore throat   . Headache   . Carotid artery occlusion   . COPD (chronic obstructive pulmonary disease)   . Lumbar disc disease   . Restless leg syndrome   . Allergic rhinitis     Past Surgical History  Procedure Date  . Carotid endarterectomy 10/15/2010    left  . Hand surgery     repair of the left long, ring, and small finger after a saw injury  . Video bronchoscopy 07/24/2012    Procedure: VIDEO  BRONCHOSCOPY WITH FLUORO;  Surgeon: Barbaraann Share, MD;  Location: [facility];  Service: Cardiopulmonary;  Laterality: Bilateral;    Family History  Problem Relation Age of Onset  . Heart disease Father     MI  . Alcohol abuse Mother   . Stroke Brother   . Heart disease Brother     Social History History  Substance Use Topics  . Smoking status: Current Every Day Smoker -- 1.0 packs/day for 55 years    Types: Cigarettes  . Smokeless tobacco: Former Neurosurgeon    Quit date: 04/19/1952     Comment: CURRENTLY SMOKING 1/2 PPD  . Alcohol Use: 4.2 oz/week    7 Cans of beer per week    Current Outpatient Prescriptions  Medication Sig Dispense Refill  . albuterol (PROAIR HFA) 108 (90 BASE) MCG/ACT inhaler Inhale 2 puffs into the lungs every 6 (six) hours as needed for shortness of breath.  1 Inhaler  1  . albuterol (PROVENTIL) (2.5 MG/3ML) 0.083% nebulizer solution Take 2.5 mg by nebulization 4 (four) times daily as needed.      Marland Kitchen aspirin 81 MG tablet Take 81 mg by mouth daily.      Marland Kitchen HYDROcodone-acetaminophen (NORCO) 10-325 MG per tablet 2 tablets every 6 (six) hours as needed.       . Hydrocodone-APAP-Dietary Prod (HYDROCODONE-APAP-NUTRIT SUPP) 10-325 MG MISC Take 1 tablet by mouth as needed.      Marland Kitchen  Indacaterol Maleate (ARCAPTA NEOHALER) 75 MCG CAPS Place 1 capsule into inhaler and inhale daily.  30 capsule  6  . isosorbide mononitrate (IMDUR) 30 MG 24 hr tablet Take 30 mg by mouth daily.      Marland Kitchen omeprazole (PRILOSEC) 40 MG capsule Take 40 mg by mouth daily.      . pramipexole (MIRAPEX) 0.5 MG tablet Take 1 mg by mouth daily.      Marland Kitchen tiotropium (SPIRIVA) 18 MCG inhalation capsule Place 1 capsule (18 mcg total) into inhaler and inhale daily.  30 capsule  6    No Known Allergies  Review of Systems Constitutional-mild weight loss recently HEENT-edentulous no difficulty swallowing Thorax-right upper lobe 3.3 cm adenocarcinoma with some postobstructive pneumonitis, COPD with previous  hospitalizations for COPD flareup. He takes multiple inhalers Cardiac history of probable coronary disease and abnormal stress test GI no cirrhosis GI bleeding Endocrine no diabetes Vascular no DVT Occupational he works doing yard Artist work Neurologic no stroke or seizure BP 146/81  Pulse 96  Resp 20  Ht 5\' 9"  (1.753 m)  Wt 165 lb (74.844 kg)  BMI 24.37 kg/m2  SpO2 94%   Physical Exam General middle-aged Caucasian male accompanied by family no acute distress HEENT normocephalic pupils equal edentulous Neck well-healed left carotid endarterectomy scar no palatal adenopathy no JVD or bruit Thorax scattered rhonchi Cardiac regular rhythm of murmur or gallop Abdomen soft nontender without pulsatile mass Extremities positive for clubbing no cyanosis tenderness or edema Vascular positive pulses in all extremities Neurologic no focal motor deficit normal gait  Diagnostic Tests: CT scan, PET scan reviewed and discussed with patient and family  Impression: 3.3 cm right upper lobe mass the patient was active smoking and COPD. Clinical stage is stage I. Prior to offering surgery he will need to completely stop smoking and show improvement in pulmonary status. He is willing to attempt this.  Plan: Return in 3 weeks to see if he is stop smoking. He only a 3 week recovery to reduce his risk of postoperative pneumonia. We'll obtain a brain MRI to rule out brain metastases as he'll need this if he receives chemoradiation as well.

## 2012-08-21 ENCOUNTER — Other Ambulatory Visit: Payer: Self-pay | Admitting: *Deleted

## 2012-08-21 DIAGNOSIS — R918 Other nonspecific abnormal finding of lung field: Secondary | ICD-10-CM

## 2012-08-21 LAB — FUNGUS CULTURE W SMEAR: Fungal Smear: NONE SEEN

## 2012-08-23 ENCOUNTER — Ambulatory Visit: Payer: Medicare Other | Admitting: Cardiothoracic Surgery

## 2012-08-23 ENCOUNTER — Ambulatory Visit
Admission: RE | Admit: 2012-08-23 | Discharge: 2012-08-23 | Disposition: A | Discharge status: Home / Self Care | Payer: Medicare Other | Source: Ambulatory Visit | Attending: Cardiothoracic Surgery | Admitting: Cardiothoracic Surgery

## 2012-08-23 ENCOUNTER — Encounter: Payer: Self-pay | Admitting: Cardiothoracic Surgery

## 2012-08-23 ENCOUNTER — Other Ambulatory Visit: Payer: Self-pay | Admitting: *Deleted

## 2012-08-23 ENCOUNTER — Ambulatory Visit (INDEPENDENT_AMBULATORY_CARE_PROVIDER_SITE_OTHER): Payer: Medicare Other | Admitting: Cardiothoracic Surgery

## 2012-08-23 VITALS — BP 174/92 | HR 86 | Resp 20 | Ht 69.0 in | Wt 165.0 lb

## 2012-08-23 DIAGNOSIS — C3490 Malignant neoplasm of unspecified part of unspecified bronchus or lung: Secondary | ICD-10-CM

## 2012-08-23 DIAGNOSIS — R918 Other nonspecific abnormal finding of lung field: Secondary | ICD-10-CM

## 2012-08-23 NOTE — Progress Notes (Signed)
PCP is FRIED, [name], MD Referring Provider is Clance, Maree Krabbe, MD  Chief Complaint  Patient presents with  . Lung Cancer    further discuss surgery, S/P MRI brain 08/03/12   Admission history and physical   HPI: 72 year old Caucasian male smoker with a recently diagnosed 3 cm right upper lobe mass. Transbronchial biopsy positive for adenocarcinoma. Treated for postobstructive pneumonia but changes with productive cough. PET scan and brain MRI showed no evidence of metastatic disease consistent with clinical stage I carcinoma. PFTs show moderate COPD with FEV1 of 1.9. The patient has decreased smoking down to 5 cigarettes a day. His weight is stable. Is currently off antibiotics after taking a course of amoxicillin for postobstructive pneumonitis. He has had occasional scant hemoptysis  Review of his myocardial scan-stress test performed last year indicates a weakly positive study with some evidence of inferior ischemia. Ejection fraction is 55% and does not decrease with stress. The patient has been clinically stable with oral imdur without clinical angina.  Past Medical History  Diagnosis Date  . Arthritis   . Wheezing   . Sore throat   . Headache   . Carotid artery occlusion   . COPD (chronic obstructive pulmonary disease)   . Lumbar disc disease   . Restless leg syndrome   . Allergic rhinitis     Past Surgical History  Procedure Laterality Date  . Carotid endarterectomy  10/15/2010    left  . Hand surgery      repair of the left long, ring, and small finger after a saw injury  . Video bronchoscopy  07/24/2012    Procedure: VIDEO BRONCHOSCOPY WITH FLUORO;  Surgeon: Barbaraann Share, MD;  Location: [facility];  Service: Cardiopulmonary;  Laterality: Bilateral;    Family History  Problem Relation Age of Onset  . Heart disease Father     MI  . Alcohol abuse Mother   . Stroke Brother   . Heart disease Brother     Social History History  Substance Use Topics  . Smoking  status: Current Every Day Smoker -- 1.00 packs/day for 55 years    Types: Cigarettes  . Smokeless tobacco: Former Neurosurgeon    Quit date: 04/19/1952     Comment: CURRENTLY SMOKING 1/2 PPD  . Alcohol Use: 4.2 oz/week    7 Cans of beer per week    Current Outpatient Prescriptions  Medication Sig Dispense Refill  . albuterol (PROAIR HFA) 108 (90 BASE) MCG/ACT inhaler Inhale 2 puffs into the lungs every 6 (six) hours as needed for shortness of breath.  1 Inhaler  1  . albuterol (PROVENTIL) (2.5 MG/3ML) 0.083% nebulizer solution Take 2.5 mg by nebulization 4 (four) times daily as needed.      Marland Kitchen aspirin 81 MG tablet Take 81 mg by mouth daily.      Marland Kitchen HYDROcodone-acetaminophen (NORCO) 10-325 MG per tablet 2 tablets every 6 (six) hours as needed.       . isosorbide mononitrate (IMDUR) 30 MG 24 hr tablet Take 30 mg by mouth daily.      Marland Kitchen omeprazole (PRILOSEC) 40 MG capsule Take 40 mg by mouth daily.      . pramipexole (MIRAPEX) 0.5 MG tablet Take 1 mg by mouth daily.      . Indacaterol Maleate (ARCAPTA NEOHALER) 75 MCG CAPS Place 1 capsule into inhaler and inhale daily.  30 capsule  6  . tiotropium (SPIRIVA) 18 MCG inhalation capsule Place 1 capsule (18 mcg total) into inhaler and  inhale daily.  30 capsule  6   No current facility-administered medications for this visit.    No Known Allergies  Review of Systems No weight loss No fever No bone pain Mild scant hemoptysis Last carotid duplex shows no significant disease, positive right subclavian steal BP 174/92  Pulse 86  Resp 20  Ht 5\' 9"  (1.753 m)  Wt 165 lb (74.844 kg)  BMI 24.36 kg/m2  SpO2 96% Physical Exam Alert and comfortable accompanied by family HEENT normocephalic pupils equal Neck without JVD status post left carotid endarterectomy Thorax scattered rhonchi on right Cardiac regular rhythm without murmur or gallop Abdomen soft nontender without pulsatile mass Extremities without tenderness edema  , Positive pulses Neurologic  no focal motor deficit normal gait ambulates 200 feet without difficulty  Diagnostic Tests: PET scan, brain MRI, Myoview heart scan and PFTs are reviewed  Impression: 72 year old gentleman with moderate COPD clinical stage 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 Procedure benefits risks discussed and he understands and agrees to proceed.

## 2012-08-26 ENCOUNTER — Other Ambulatory Visit: Payer: Self-pay

## 2012-08-30 ENCOUNTER — Encounter (HOSPITAL_COMMUNITY): Payer: Self-pay

## 2012-09-01 ENCOUNTER — Encounter (HOSPITAL_COMMUNITY)
Admission: RE | Admit: 2012-09-01 | Discharge: 2012-09-01 | Disposition: A | Discharge status: Home / Self Care | Payer: Medicare Other | Source: Ambulatory Visit | Attending: Cardiothoracic Surgery | Admitting: Cardiothoracic Surgery

## 2012-09-01 ENCOUNTER — Encounter (HOSPITAL_COMMUNITY): Payer: Self-pay

## 2012-09-01 HISTORY — DX: Other nonspecific abnormal finding of lung field: R91.8

## 2012-09-01 HISTORY — DX: Anxiety disorder, unspecified: F41.9

## 2012-09-01 HISTORY — DX: Malignant (primary) neoplasm, unspecified: C80.1

## 2012-09-01 HISTORY — DX: Personal history of other diseases of the digestive system: Z87.19

## 2012-09-01 HISTORY — DX: Essential (primary) hypertension: I10

## 2012-09-01 HISTORY — DX: Shortness of breath: R06.02

## 2012-09-01 HISTORY — DX: Pneumonia, unspecified organism: J18.9

## 2012-09-01 HISTORY — DX: Gastro-esophageal reflux disease without esophagitis: K21.9

## 2012-09-01 LAB — BLOOD GAS, ARTERIAL
Acid-Base Excess: 1.9 mmol/L (ref 0.0–2.0)
Bicarbonate: 26 mEq/L — ABNORMAL HIGH (ref 20.0–24.0)
Drawn by: 206361
FIO2: 0.21 %
O2 Saturation: 95.3 %
Patient temperature: 98.6
TCO2: 27.2 mmol/L (ref 0–100)
pCO2 arterial: 40.7 mmHg (ref 35.0–45.0)
pH, Arterial: 7.421 (ref 7.350–7.450)
pO2, Arterial: 74.2 mmHg — ABNORMAL LOW (ref 80.0–100.0)

## 2012-09-01 LAB — COMPREHENSIVE METABOLIC PANEL
ALT: 24 U/L (ref 0–53)
AST: 30 U/L (ref 0–37)
Albumin: 3.3 g/dL — ABNORMAL LOW (ref 3.5–5.2)
Alkaline Phosphatase: 105 U/L (ref 39–117)
BUN: 10 mg/dL (ref 6–23)
CO2: 24 mEq/L (ref 19–32)
Calcium: 9.2 mg/dL (ref 8.4–10.5)
Chloride: 99 mEq/L (ref 96–112)
Creatinine, Ser: 0.66 mg/dL (ref 0.50–1.35)
GFR calc Af Amer: >90 mL/min (ref >90)
GFR calc non Af Amer: >90 mL/min (ref >90)
Glucose, Bld: 92 mg/dL (ref 70–99)
Potassium: 4.2 mEq/L (ref 3.5–5.1)
Sodium: 135 mEq/L (ref 135–145)
Total Bilirubin: <0.1 mg/dL — ABNORMAL LOW (ref 0.3–1.2)
Total Protein: 7.2 g/dL (ref 6.0–8.3)

## 2012-09-01 LAB — APTT: aPTT: 34 seconds (ref 24–37)

## 2012-09-01 LAB — CBC
HCT: 37.9 % — ABNORMAL LOW (ref 39.0–52.0)
Hemoglobin: 12.8 g/dL — ABNORMAL LOW (ref 13.0–17.0)
MCH: 30.7 pg (ref 26.0–34.0)
MCHC: 33.8 g/dL (ref 30.0–36.0)
MCV: 90.9 fL (ref 78.0–100.0)
Platelets: 345 10*3/uL (ref 150–400)
RBC: 4.17 MIL/uL — ABNORMAL LOW (ref 4.22–5.81)
RDW: 14.2 % (ref 11.5–15.5)
WBC: 8.5 10*3/uL (ref 4.0–10.5)

## 2012-09-01 LAB — URINALYSIS, ROUTINE W REFLEX MICROSCOPIC
Bilirubin Urine: NEGATIVE
Glucose, UA: NEGATIVE mg/dL
Hgb urine dipstick: NEGATIVE
Ketones, ur: NEGATIVE mg/dL
Leukocytes, UA: NEGATIVE
Nitrite: NEGATIVE
Protein, ur: NEGATIVE mg/dL
Specific Gravity, Urine: 1.005 (ref 1.005–1.030)
Urobilinogen, UA: 0.2 mg/dL (ref 0.0–1.0)
pH: 7 (ref 5.0–8.0)

## 2012-09-01 LAB — PROTIME-INR
INR: 0.87 (ref 0.00–1.49)
Prothrombin Time: 11.8 seconds (ref 11.6–15.2)

## 2012-09-01 NOTE — Pre-Procedure Instructions (Addendum)
[name]    Your procedure is scheduled on:  09/05/12  Report to [facility] at530 AM.  Call this number if you have problems the morning of surgery: [phone]   Remember:   Do not eat food or drink liquids after midnight.   Take these medicines the morning of surgery with A SIP OF WATER:pain med, inhaler, clonazepam, indur, omeprazole.   Do not wear jewelry, make-up or nail polish.  Do not wear lotions, powders, or perfumes. You may wear deodorant.  Do not shave 48 hours prior to surgery. Men may shave face and neck.  Do not bring valuables to the hospital.  Contacts, dentures or bridgework may not be worn into surgery.  Leave suitcase in the car. After surgery it may be brought to your room.  For patients admitted to the hospital, checkout time is 11:00 AM the day of  discharge.   Patients discharged the day of surgery will not be allowed to drive  home.  Name and phone number of your driver:   Special Instructions: Shower using CHG 2 nights before surgery and the night before surgery.  If you shower the day of surgery use CHG.  Use special wash - you have one bottle of CHG for all showers.  You should use approximately 1/3 of the bottle for each shower.   Please read over the following fact sheets that you were given: Pain Booklet, Coughing and Deep Breathing, Blood Transfusion Information, MRSA Information and Surgical Site Infection Prevention

## 2012-09-01 NOTE — Progress Notes (Addendum)
req'd results of stress test from pcp dr Molly Maduro [facility]  Patient and spouse stated there is a lrge discrepiency between bp on each arm 164/80 left and 120/85  roght today

## 2012-09-01 NOTE — Progress Notes (Signed)
09/01/12 1448  OBSTRUCTIVE SLEEP APNEA  Score 4 or greater  Results sent to PCP

## 2012-09-04 ENCOUNTER — Telehealth: Payer: Self-pay | Admitting: *Deleted

## 2012-09-04 MED ORDER — DEXTROSE 5 % IV SOLN
1.5000 g | INTRAVENOUS | Status: DC
  Filled 2012-09-04: qty 1.5

## 2012-09-04 NOTE — Progress Notes (Signed)
Anesthesia chart review:  Patient is a 72 year old male scheduled for right VATS, RU lobectomy by Dr. Donata Clay on 09/05/12.  History includes lung cancer (adenocarcinoma), COPD with ongoing smoking, PAD with history of left carotid endarterectomy '12 and right subclavian stenosis per surgeon's notes (with noted BP discrepancy in BUE), hiatal hernia, GERD, RLS, anxiety.  OSA screening score was 4 or greater. PCP is Dr. Marinda Elk.  Pulmonologist is Dr. Shelle Iron.   EKG on 09/01/12 showed NSR.  He was referred to [facility] in late 2013 for atypical chest pain.  He saw Dr. Eldridge Dace who ordered a nuclear stress test which was done on 06/16/12 and showed evidence of mild ischemia in the basal inferior region, TID ratio increased, but this is not apparent visually.  Post-stress LV function is normal, EF 54%, no regional wall motion abnormalities.  He was started on Imdur, and Dr. Eldridge Dace did not recommend cath at that time unless symptoms were refractory to medical therapy.  (Results of patient's stress test have been reviewed by Dr. Donata Clay.) Additional records from [facility] have been requested, but are still pending.  PFTs on 12/10/11 showed a FEV1 to be 1.9 or 63% of predicted DLCO of 86%.    Preoperative CXR and labs noted.  I reviewed above with Anesthesiologist Dr. Sandford Craze.  She spoke with Dr. Donata Clay who stated that Dr. Eldridge Dace was aware of the planned procedure and had not recommended additional testing prior to surgery.  Shonna Chock, PA-C 09/04/12 1229

## 2012-09-04 NOTE — Telephone Encounter (Signed)
Patient made aware of surgery change.  Patient will be seeing Dr. Eldridge Dace on Wednesday 2/26 AM and depending on that appt we will reschedule surgery per MD.  Patient and family are aware.

## 2012-09-05 ENCOUNTER — Inpatient Hospital Stay (HOSPITAL_COMMUNITY): Admission: RE | Admit: 2012-09-05 | Payer: Medicare Other | Source: Ambulatory Visit | Admitting: Cardiothoracic Surgery

## 2012-09-05 ENCOUNTER — Encounter (HOSPITAL_COMMUNITY): Admission: RE | Payer: Self-pay | Source: Ambulatory Visit

## 2012-09-05 LAB — AFB CULTURE WITH SMEAR (NOT AT ARMC)

## 2012-09-05 SURGERY — VIDEO ASSISTED THORACOSCOPY
Anesthesia: General | Site: Chest | Laterality: Right

## 2012-09-06 ENCOUNTER — Other Ambulatory Visit: Payer: Self-pay | Admitting: Interventional Cardiology

## 2012-09-08 ENCOUNTER — Inpatient Hospital Stay (HOSPITAL_BASED_OUTPATIENT_CLINIC_OR_DEPARTMENT_OTHER)
Admission: RE | Admit: 2012-09-08 | Discharge: 2012-09-08 | Disposition: A | Discharge status: Home / Self Care | Payer: Medicare Other | Source: Ambulatory Visit | Attending: Interventional Cardiology | Admitting: Interventional Cardiology

## 2012-09-08 ENCOUNTER — Ambulatory Visit: Payer: Medicare Other | Admitting: Pulmonary Disease

## 2012-09-08 ENCOUNTER — Encounter (HOSPITAL_BASED_OUTPATIENT_CLINIC_OR_DEPARTMENT_OTHER)
Admission: RE | Disposition: A | Discharge status: Home / Self Care | Payer: Self-pay | Source: Ambulatory Visit | Attending: Interventional Cardiology

## 2012-09-08 ENCOUNTER — Encounter (HOSPITAL_BASED_OUTPATIENT_CLINIC_OR_DEPARTMENT_OTHER): Payer: Self-pay | Admitting: Interventional Cardiology

## 2012-09-08 DIAGNOSIS — I253 Aneurysm of heart: Secondary | ICD-10-CM | POA: Insufficient documentation

## 2012-09-08 DIAGNOSIS — I2582 Chronic total occlusion of coronary artery: Secondary | ICD-10-CM | POA: Insufficient documentation

## 2012-09-08 DIAGNOSIS — I10 Essential (primary) hypertension: Secondary | ICD-10-CM | POA: Insufficient documentation

## 2012-09-08 DIAGNOSIS — I2510 Atherosclerotic heart disease of native coronary artery without angina pectoris: Secondary | ICD-10-CM | POA: Insufficient documentation

## 2012-09-08 DIAGNOSIS — Z01810 Encounter for preprocedural cardiovascular examination: Secondary | ICD-10-CM

## 2012-09-08 DIAGNOSIS — I2584 Coronary atherosclerosis due to calcified coronary lesion: Secondary | ICD-10-CM | POA: Insufficient documentation

## 2012-09-08 DIAGNOSIS — R9439 Abnormal result of other cardiovascular function study: Secondary | ICD-10-CM | POA: Insufficient documentation

## 2012-09-08 DIAGNOSIS — R9430 Abnormal result of cardiovascular function study, unspecified: Secondary | ICD-10-CM

## 2012-09-08 DIAGNOSIS — I739 Peripheral vascular disease, unspecified: Secondary | ICD-10-CM | POA: Insufficient documentation

## 2012-09-08 DIAGNOSIS — C3490 Malignant neoplasm of unspecified part of unspecified bronchus or lung: Secondary | ICD-10-CM | POA: Insufficient documentation

## 2012-09-08 HISTORY — DX: Abnormal result of cardiovascular function study, unspecified: R94.30

## 2012-09-08 HISTORY — DX: Peripheral vascular disease, unspecified: I73.9

## 2012-09-08 HISTORY — DX: Encounter for preprocedural cardiovascular examination: Z01.810

## 2012-09-08 SURGERY — JV LEFT HEART CATHETERIZATION WITH CORONARY ANGIOGRAM

## 2012-09-08 MED ORDER — ONDANSETRON HCL 4 MG/2ML IJ SOLN: 4.0000 mg | Freq: Four times a day (QID) | INTRAMUSCULAR | Status: DC | PRN

## 2012-09-08 MED ORDER — SODIUM CHLORIDE 0.9 % IJ SOLN: 3.0000 mL | Freq: Two times a day (BID) | INTRAMUSCULAR | Status: DC

## 2012-09-08 MED ORDER — SODIUM CHLORIDE 0.9 % IJ SOLN: 3.0000 mL | INTRAMUSCULAR | Status: DC | PRN

## 2012-09-08 MED ORDER — SODIUM CHLORIDE 0.9 % IV SOLN
INTRAVENOUS | Status: DC
  Administered 2012-09-08: 09:00:00 via INTRAVENOUS

## 2012-09-08 MED ORDER — SODIUM CHLORIDE 0.9 % IV SOLN: 250.0000 mL | INTRAVENOUS | Status: DC | PRN

## 2012-09-08 MED ORDER — ACETAMINOPHEN 325 MG PO TABS: 650.0000 mg | ORAL_TABLET | ORAL | Status: DC | PRN

## 2012-09-08 MED ORDER — DIAZEPAM 5 MG PO TABS
5.0000 mg | ORAL_TABLET | ORAL | Status: AC
  Administered 2012-09-08: 5 mg via ORAL

## 2012-09-08 MED ORDER — ASPIRIN 81 MG PO CHEW
324.0000 mg | CHEWABLE_TABLET | ORAL | Status: AC
  Administered 2012-09-08: 324 mg via ORAL

## 2012-09-08 MED ORDER — SODIUM CHLORIDE 0.9 % IV SOLN: 1.0000 mL/kg/h | INTRAVENOUS | Status: AC

## 2012-09-08 NOTE — CV Procedure (Addendum)
PROCEDURE:  Left heart catheterization with selective coronary angiography, left ventriculogram.  INDICATIONS:  Preoperative evaluation.  Mildly abnormal stress test.  The risks, benefits, and details of the procedure were explained to the patient.  The patient verbalized understanding and wanted to proceed.  Informed written consent was obtained.  PROCEDURE TECHNIQUE:  After Xylocaine anesthesia a 36F sheath was placed in the right femoral artery with a single anterior needle wall stick.   Left coronary angiography was done using a Judkins L4 guide catheter.  Right coronary angiography was done using a 3DRC guide catheter.  Left ventriculography was done using a pigtail catheter.    CONTRAST:  Total of 100 cc.  COMPLICATIONS:  None.    HEMODYNAMICS:  Aortic pressure was 112/62; LV pressure was 114/10; LVEDP 14.  There was no gradient between the left ventricle and aorta.    ANGIOGRAPHIC DATA:   The left main coronary artery is mildly calcified.  The left anterior descending artery is a large vessel to the apex.  There is proximal calcification.  In the mid portion, at the origin of a medium sized first diagonal, there is a small aneurysm.  There appears to be a 40-50% stenosis in the LAD at this location.  Distal vessel is large with only mild atherosclerosis.  The left circumflex artery is a large vessel which appears angiographically normal.  OM 1 is medium sized with mild disease.  The right coronary artery is occluded proximally.  There is heavy calcification proximally.  There are brisk left to right collaterals which fill the vessel.  There are some right to right collaterals.  There may be a microchannel through this occlusion as there is brisk filling antegrade as well.  LEFT VENTRICULOGRAM:  Left ventricular angiogram was done in the 30 RAO projection and revealed basal inferior hypokinesis and overall normal systolic function with an estimated ejection fraction of 50%.  LVEDP was 14  mmHg.  ABDOMINAL AORTOGRAM: Mild distal aortic ectasia.  Mild, diffuse atherosclerosis.  Patent aortoiliac bifurcation.  Single renal bilaterally which are patent. Mild, proximal left renal atherosclerosis.  SMA patent.  IMPRESSIONS:  1. Normal left main coronary artery. 2. 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 3. Widely patent left circumflex artery and its branches. 4. Chronically occluded proximal right coronary artery.  RCA fills via microchannel antegrade, and with brisk left to right collaterals. 5. Overall, normal left ventricular systolic function.  Basal inferior hypokinesis.  LVEDP 14 mmHg.  Ejection fraction 50%.  RECOMMENDATION:  CTO of RCA explains mild stress test abnormality.  He has some antegrade flow and brisk collaterals.  Since he is not having symptoms, would continue medical therapy.  No further cardiac testing needed prior to surgery.

## 2012-09-08 NOTE — H&P (Signed)
Date of Initial H&P: 09/06/12  History reviewed, patient examined, no change in status, stable for cath.  If stent needed, would plan bare metal stent.

## 2012-09-08 NOTE — Progress Notes (Signed)
Bedrest begins @ 1015, tegaderm dressing applied to right groin site by Gertie Fey, site level 0.

## 2012-09-11 ENCOUNTER — Other Ambulatory Visit: Payer: Self-pay | Admitting: *Deleted

## 2012-09-11 ENCOUNTER — Encounter: Payer: Self-pay | Admitting: Cardiothoracic Surgery

## 2012-09-11 ENCOUNTER — Encounter (HOSPITAL_COMMUNITY): Payer: Self-pay | Admitting: Pharmacy Technician

## 2012-09-11 ENCOUNTER — Ambulatory Visit (INDEPENDENT_AMBULATORY_CARE_PROVIDER_SITE_OTHER): Payer: Medicare Other | Admitting: Cardiothoracic Surgery

## 2012-09-11 VITALS — BP 125/88 | HR 96 | Resp 20 | Ht 69.0 in | Wt 173.0 lb

## 2012-09-11 DIAGNOSIS — C3490 Malignant neoplasm of unspecified part of unspecified bronchus or lung: Secondary | ICD-10-CM

## 2012-09-11 NOTE — Progress Notes (Signed)
PCP is FRIED, [name], MD Referring Chrisanne Loose is [name], MD  Chief Complaint  Patient presents with  . Lung Cancer    Further discuss surgery scheduled for 09/14/2012    HPI: Right upper lobe mass biopsy proven adenocarcinoma with pending right upper lobectomy. Patient underwent cardiac evaluation by Dr. Eldridge Dace which showed good LV function, chronic occlusion of the RCA which collateralized the distal vessel and no other significant CAD. He is cleared for right thoracotomy and lobectomy.  Patient still smoking 5 cigarettes a day. No symptoms of bronchitis productive cough fever or shortness of breath  Patient rescheduled for right thoracotomy right upper lobectomy on March 6.  Past Medical History  Diagnosis Date  . Arthritis   . Wheezing   . Sore throat   . Carotid artery occlusion   . COPD (chronic obstructive pulmonary disease)   . Lumbar disc disease   . Restless leg syndrome   . Allergic rhinitis   . Hypertension   . Anxiety     anxiety  . Shortness of breath   . Lung mass   . Pneumonia     ? now  . GERD (gastroesophageal reflux disease)   . H/O hiatal hernia   . Cancer     lung mass  . Pre-operative cardiovascular examination   . Nonspecific abnormal unspecified cardiovascular function study   . Peripheral vascular disease, unspecified     Past Surgical History  Procedure Laterality Date  . Carotid endarterectomy  10/15/2010    left  . Hand surgery      repair of the left long, ring, and small finger after a saw injury  . Video bronchoscopy  07/24/2012    Procedure: VIDEO BRONCHOSCOPY WITH FLUORO;  Surgeon: Barbaraann Share, MD;  Location: [facility];  Service: Cardiopulmonary;  Laterality: Bilateral;    Family History  Problem Relation Age of Onset  . Heart disease Father     MI  . Alcohol abuse Mother   . Stroke Brother   . Heart disease Brother     Social History History  Substance Use Topics  . Smoking status: Current Every Day Smoker --  1.00 packs/day for 55 years    Types: Cigarettes  . Smokeless tobacco: Former Neurosurgeon    Quit date: 04/19/1952     Comment: CURRENTLY SMOKING 1/2 PPD  . Alcohol Use: 4.2 oz/week    7 Cans of beer per week    Current Outpatient Prescriptions  Medication Sig Dispense Refill  . albuterol (PROAIR HFA) 108 (90 BASE) MCG/ACT inhaler Inhale 2 puffs into the lungs every 6 (six) hours as needed for shortness of breath.  1 Inhaler  1  . aspirin EC 81 MG tablet Take 81 mg by mouth daily.      . clonazePAM (KLONOPIN) 0.5 MG tablet Take 0.5 mg by mouth 2 (two) times daily as needed for anxiety.      Marland Kitchen HYDROcodone-acetaminophen (NORCO) 10-325 MG per tablet Take 2 tablets by mouth every 6 (six) hours as needed for pain.       . isosorbide mononitrate (IMDUR) 30 MG 24 hr tablet Take 30 mg by mouth daily.      Marland Kitchen omeprazole (PRILOSEC) 40 MG capsule Take 40 mg by mouth daily.      . pramipexole (MIRAPEX) 0.5 MG tablet Take 1-1.5 mg by mouth at bedtime as needed. Restless legs       No current facility-administered medications for this visit.    No Known  Allergies  Review of Systems no fever anxious about surgery  BP 125/88  Pulse 96  Resp 20  Ht 5\' 9"  (1.753 m)  Wt 173 lb (78.472 kg)  BMI 25.54 kg/m2  SpO2 97% Physical Exam Alert and comfortable Bilateral scattered rhonchi Heart rhythm regular without murmur or gallop  Diagnostic Tests: Cardiac catheterization results reviewed with patient and family  Plan admit for right upper lobectomy on March 6 for biopsy-proven adenocarcinoma lung   Plan:

## 2012-09-13 MED ORDER — PHENYLEPHRINE HCL 0.25 % NA SOLN
1.0000 | Freq: Four times a day (QID) | NASAL | Status: DC | PRN
  Filled 2012-09-13: qty 15

## 2012-09-13 MED ORDER — DEXTROSE 5 % IV SOLN
1.5000 g | INTRAVENOUS | Status: AC
  Administered 2012-09-14: 1.5 g via INTRAVENOUS
  Filled 2012-09-13: qty 1.5

## 2012-09-14 ENCOUNTER — Encounter (HOSPITAL_COMMUNITY): Payer: Self-pay | Admitting: *Deleted

## 2012-09-14 ENCOUNTER — Inpatient Hospital Stay (HOSPITAL_COMMUNITY): Payer: Medicare Other

## 2012-09-14 ENCOUNTER — Encounter (HOSPITAL_COMMUNITY): Payer: Self-pay | Admitting: Vascular Surgery

## 2012-09-14 ENCOUNTER — Inpatient Hospital Stay (HOSPITAL_COMMUNITY): Payer: Medicare Other | Admitting: Anesthesiology

## 2012-09-14 ENCOUNTER — Inpatient Hospital Stay (HOSPITAL_COMMUNITY)
Admission: RE | Admit: 2012-09-14 | Discharge: 2012-09-20 | DRG: 164 | Disposition: A | Discharge status: Home / Self Care | Payer: Medicare Other | Source: Ambulatory Visit | Attending: Cardiothoracic Surgery | Admitting: Cardiothoracic Surgery

## 2012-09-14 ENCOUNTER — Encounter (HOSPITAL_COMMUNITY)
Admission: RE | Disposition: A | Discharge status: Home / Self Care | Payer: Self-pay | Source: Ambulatory Visit | Attending: Cardiothoracic Surgery

## 2012-09-14 DIAGNOSIS — C3410 Malignant neoplasm of upper lobe, unspecified bronchus or lung: Principal | ICD-10-CM | POA: Diagnosis present

## 2012-09-14 DIAGNOSIS — J40 Bronchitis, not specified as acute or chronic: Secondary | ICD-10-CM | POA: Diagnosis present

## 2012-09-14 DIAGNOSIS — J449 Chronic obstructive pulmonary disease, unspecified: Secondary | ICD-10-CM | POA: Diagnosis present

## 2012-09-14 DIAGNOSIS — J4489 Other specified chronic obstructive pulmonary disease: Secondary | ICD-10-CM | POA: Diagnosis present

## 2012-09-14 DIAGNOSIS — C801 Malignant (primary) neoplasm, unspecified: Secondary | ICD-10-CM

## 2012-09-14 DIAGNOSIS — D62 Acute posthemorrhagic anemia: Secondary | ICD-10-CM | POA: Diagnosis not present

## 2012-09-14 DIAGNOSIS — E871 Hypo-osmolality and hyponatremia: Secondary | ICD-10-CM | POA: Diagnosis not present

## 2012-09-14 HISTORY — PX: VIDEO ASSISTED THORACOSCOPY: SHX5073

## 2012-09-14 HISTORY — PX: LOBECTOMY: SHX5089

## 2012-09-14 HISTORY — DX: Malignant (primary) neoplasm, unspecified: C80.1

## 2012-09-14 LAB — POCT I-STAT 3, ART BLOOD GAS (G3+)
Acid-Base Excess: 2 mmol/L (ref 0.0–2.0)
Bicarbonate: 28 mEq/L — ABNORMAL HIGH (ref 20.0–24.0)
O2 Saturation: 94 %
Patient temperature: 98.3
TCO2: 30 mmol/L (ref 0–100)
pCO2 arterial: 50.5 mmHg — ABNORMAL HIGH (ref 35.0–45.0)
pH, Arterial: 7.351 (ref 7.350–7.450)
pO2, Arterial: 75 mmHg — ABNORMAL LOW (ref 80.0–100.0)

## 2012-09-14 LAB — POCT I-STAT 4, (NA,K, GLUC, HGB,HCT)
Glucose, Bld: 119 mg/dL — ABNORMAL HIGH (ref 70–99)
Glucose, Bld: 124 mg/dL — ABNORMAL HIGH (ref 70–99)
HCT: 31 % — ABNORMAL LOW (ref 39.0–52.0)
HCT: 32 % — ABNORMAL LOW (ref 39.0–52.0)
Hemoglobin: 10.5 g/dL — ABNORMAL LOW (ref 13.0–17.0)
Hemoglobin: 10.9 g/dL — ABNORMAL LOW (ref 13.0–17.0)
Potassium: 4.3 mEq/L (ref 3.5–5.1)
Potassium: 4.6 mEq/L (ref 3.5–5.1)
Sodium: 137 mEq/L (ref 135–145)
Sodium: 135 mEq/L (ref 135–145)

## 2012-09-14 LAB — BLOOD GAS, ARTERIAL
Acid-Base Excess: 0.2 mmol/L (ref 0.0–2.0)
Drawn by: 34152
O2 Content: 6 L/min
O2 Saturation: 97.1 %
Patient temperature: 98.6

## 2012-09-14 LAB — TYPE AND SCREEN
ABO/RH(D): A POS
Antibody Screen: NEGATIVE

## 2012-09-14 SURGERY — LOBECTOMY
Anesthesia: General | Site: Chest | Laterality: Right | Wound class: Clean Contaminated

## 2012-09-14 MED ORDER — ROCURONIUM BROMIDE 100 MG/10ML IV SOLN
INTRAVENOUS | Status: DC | PRN
  Administered 2012-09-14: 50 mg via INTRAVENOUS

## 2012-09-14 MED ORDER — CLONAZEPAM 0.5 MG PO TABS
0.5000 mg | ORAL_TABLET | Freq: Two times a day (BID) | ORAL | Status: DC | PRN
  Administered 2012-09-15 – 2012-09-19 (×4): 0.5 mg via ORAL
  Filled 2012-09-14 (×4): qty 1

## 2012-09-14 MED ORDER — ALBUTEROL SULFATE (5 MG/ML) 0.5% IN NEBU: 2.5000 mg | INHALATION_SOLUTION | RESPIRATORY_TRACT | Status: DC

## 2012-09-14 MED ORDER — NALOXONE HCL 0.4 MG/ML IJ SOLN
INTRAMUSCULAR | Status: DC | PRN
  Administered 2012-09-14: 40 ug via INTRAVENOUS

## 2012-09-14 MED ORDER — TRAMADOL HCL 50 MG PO TABS
50.0000 mg | ORAL_TABLET | Freq: Four times a day (QID) | ORAL | Status: DC | PRN
  Administered 2012-09-14 – 2012-09-15 (×2): 100 mg via ORAL
  Filled 2012-09-14 (×2): qty 2

## 2012-09-14 MED ORDER — BUPIVACAINE 0.5 % ON-Q PUMP SINGLE CATH 400 ML
400.0000 mL | INJECTION | Status: DC
  Administered 2012-09-14: 400 mL
  Filled 2012-09-14: qty 400

## 2012-09-14 MED ORDER — PRAMIPEXOLE DIHYDROCHLORIDE 1 MG PO TABS
1.0000 mg | ORAL_TABLET | Freq: Every evening | ORAL | Status: DC | PRN
  Administered 2012-09-18: 1.5 mg via ORAL
  Administered 2012-09-19: 1 mg via ORAL
  Filled 2012-09-14: qty 2

## 2012-09-14 MED ORDER — SODIUM CHLORIDE 0.9 % IJ SOLN
9.0000 mL | INTRAMUSCULAR | Status: DC | PRN
  Administered 2012-09-18: 12:00:00 via INTRAVENOUS

## 2012-09-14 MED ORDER — DEXTROSE 5 % IV SOLN
1.5000 g | Freq: Two times a day (BID) | INTRAVENOUS | Status: AC
  Administered 2012-09-14 – 2012-09-15 (×2): 1.5 g via INTRAVENOUS
  Filled 2012-09-14 (×2): qty 1.5

## 2012-09-14 MED ORDER — BUDESONIDE-FORMOTEROL FUMARATE 160-4.5 MCG/ACT IN AERO
2.0000 | INHALATION_SPRAY | Freq: Two times a day (BID) | RESPIRATORY_TRACT | Status: DC
  Administered 2012-09-14 – 2012-09-20 (×12): 2 via RESPIRATORY_TRACT
  Filled 2012-09-14: qty 6

## 2012-09-14 MED ORDER — ASPIRIN EC 81 MG PO TBEC
81.0000 mg | DELAYED_RELEASE_TABLET | Freq: Every day | ORAL | Status: DC
  Administered 2012-09-15 – 2012-09-19 (×5): 81 mg via ORAL
  Filled 2012-09-14 (×6): qty 1

## 2012-09-14 MED ORDER — EPHEDRINE SULFATE 50 MG/ML IJ SOLN
INTRAMUSCULAR | Status: DC | PRN
  Administered 2012-09-14 (×5): 5 mg via INTRAVENOUS

## 2012-09-14 MED ORDER — FENTANYL CITRATE 0.05 MG/ML IJ SOLN
INTRAMUSCULAR | Status: DC | PRN
  Administered 2012-09-14: 50 ug via INTRAVENOUS
  Administered 2012-09-14: 100 ug via INTRAVENOUS
  Administered 2012-09-14: 25 ug via INTRAVENOUS
  Administered 2012-09-14: 100 ug via INTRAVENOUS
  Administered 2012-09-14: 25 ug via INTRAVENOUS
  Administered 2012-09-14 (×3): 50 ug via INTRAVENOUS
  Administered 2012-09-14: 25 ug via INTRAVENOUS
  Administered 2012-09-14: 50 ug via INTRAVENOUS
  Administered 2012-09-14: 25 ug via INTRAVENOUS
  Administered 2012-09-14 (×2): 50 ug via INTRAVENOUS

## 2012-09-14 MED ORDER — DEXTROSE-NACL 5-0.9 % IV SOLN
INTRAVENOUS | Status: DC
  Administered 2012-09-14: 125 mL via INTRAVENOUS

## 2012-09-14 MED ORDER — ISOSORBIDE MONONITRATE ER 30 MG PO TB24
30.0000 mg | ORAL_TABLET | Freq: Every day | ORAL | Status: DC
  Administered 2012-09-15 – 2012-09-19 (×5): 30 mg via ORAL
  Filled 2012-09-14 (×6): qty 1

## 2012-09-14 MED ORDER — ONDANSETRON HCL 4 MG/2ML IJ SOLN
INTRAMUSCULAR | Status: DC | PRN
  Administered 2012-09-14: 4 mg via INTRAVENOUS

## 2012-09-14 MED ORDER — DIPHENHYDRAMINE HCL 50 MG/ML IJ SOLN
12.5000 mg | Freq: Four times a day (QID) | INTRAMUSCULAR | Status: DC | PRN
  Administered 2012-09-17 (×2): 12.5 mg via INTRAVENOUS
  Filled 2012-09-14 (×2): qty 1

## 2012-09-14 MED ORDER — NEOSTIGMINE METHYLSULFATE 1 MG/ML IJ SOLN
INTRAMUSCULAR | Status: DC | PRN
  Administered 2012-09-14: 4 mg via INTRAVENOUS

## 2012-09-14 MED ORDER — ACETAMINOPHEN 10 MG/ML IV SOLN
1000.0000 mg | Freq: Four times a day (QID) | INTRAVENOUS | Status: AC
  Administered 2012-09-14 – 2012-09-15 (×4): 1000 mg via INTRAVENOUS
  Filled 2012-09-14 (×5): qty 100

## 2012-09-14 MED ORDER — OXYCODONE-ACETAMINOPHEN 5-325 MG PO TABS
1.0000 | ORAL_TABLET | ORAL | Status: DC | PRN
  Administered 2012-09-16: 2 via ORAL
  Filled 2012-09-14: qty 2
  Filled 2012-09-14: qty 1
  Filled 2012-09-14 (×7): qty 2
  Filled 2012-09-14: qty 1
  Filled 2012-09-14: qty 2
  Filled 2012-09-14: qty 1

## 2012-09-14 MED ORDER — BUTAMBEN-TETRACAINE-BENZOCAINE 2-2-14 % EX AERO: 1.0000 | INHALATION_SPRAY | Freq: Once | CUTANEOUS | Status: DC

## 2012-09-14 MED ORDER — ONDANSETRON HCL 4 MG/2ML IJ SOLN: 4.0000 mg | Freq: Four times a day (QID) | INTRAMUSCULAR | Status: DC | PRN

## 2012-09-14 MED ORDER — GLYCOPYRROLATE 0.2 MG/ML IJ SOLN
INTRAMUSCULAR | Status: DC | PRN
  Administered 2012-09-14: 0.6 mg via INTRAVENOUS

## 2012-09-14 MED ORDER — LIDOCAINE HCL (CARDIAC) 20 MG/ML IV SOLN
INTRAVENOUS | Status: DC | PRN
  Administered 2012-09-14: 80 mg via INTRAVENOUS

## 2012-09-14 MED ORDER — LACTATED RINGERS IV SOLN
INTRAVENOUS | Status: DC | PRN
  Administered 2012-09-14: 07:00:00 via INTRAVENOUS

## 2012-09-14 MED ORDER — HYDROMORPHONE HCL PF 1 MG/ML IJ SOLN
INTRAMUSCULAR | Status: AC
  Administered 2012-09-14: 0.5 mg via INTRAVENOUS
  Filled 2012-09-14: qty 2

## 2012-09-14 MED ORDER — PANTOPRAZOLE SODIUM 40 MG PO TBEC
80.0000 mg | DELAYED_RELEASE_TABLET | Freq: Every day | ORAL | Status: DC
  Administered 2012-09-15 – 2012-09-19 (×5): 80 mg via ORAL
  Filled 2012-09-14: qty 2
  Filled 2012-09-14: qty 1
  Filled 2012-09-14 (×4): qty 2

## 2012-09-14 MED ORDER — LEVALBUTEROL HCL 1.25 MG/0.5ML IN NEBU
1.2500 mg | INHALATION_SOLUTION | RESPIRATORY_TRACT | Status: DC
  Administered 2012-09-14 – 2012-09-15 (×6): 1.25 mg via RESPIRATORY_TRACT
  Filled 2012-09-14 (×11): qty 0.5

## 2012-09-14 MED ORDER — MIDAZOLAM HCL 5 MG/5ML IJ SOLN
INTRAMUSCULAR | Status: DC | PRN
  Administered 2012-09-14: 2 mg via INTRAVENOUS

## 2012-09-14 MED ORDER — ONDANSETRON HCL 4 MG/2ML IJ SOLN: 4.0000 mg | Freq: Once | INTRAMUSCULAR | Status: DC | PRN

## 2012-09-14 MED ORDER — HYDROMORPHONE HCL PF 1 MG/ML IJ SOLN
INTRAMUSCULAR | Status: AC
  Filled 2012-09-14: qty 1

## 2012-09-14 MED ORDER — OXYCODONE HCL 5 MG PO TABS
5.0000 mg | ORAL_TABLET | ORAL | Status: AC | PRN
  Administered 2012-09-14 – 2012-09-15 (×4): 10 mg via ORAL
  Filled 2012-09-14 (×4): qty 2

## 2012-09-14 MED ORDER — 0.9 % SODIUM CHLORIDE (POUR BTL) OPTIME
TOPICAL | Status: DC | PRN
  Administered 2012-09-14: 2000 mL

## 2012-09-14 MED ORDER — LEVALBUTEROL HCL 1.25 MG/0.5ML IN NEBU: 1.2500 mg | INHALATION_SOLUTION | Freq: Four times a day (QID) | RESPIRATORY_TRACT | Status: DC

## 2012-09-14 MED ORDER — LIDOCAINE HCL 4 % MT SOLN
OROMUCOSAL | Status: DC | PRN
  Administered 2012-09-14: 4 mL via TOPICAL

## 2012-09-14 MED ORDER — OXYCODONE-ACETAMINOPHEN 5-325 MG PO TABS
1.0000 | ORAL_TABLET | ORAL | Status: DC | PRN
  Administered 2012-09-15 – 2012-09-18 (×9): 2 via ORAL
  Administered 2012-09-18 (×2): 1 via ORAL
  Administered 2012-09-18 – 2012-09-19 (×3): 2 via ORAL
  Administered 2012-09-19 (×2): 1 via ORAL
  Administered 2012-09-20: 2 via ORAL
  Filled 2012-09-14 (×6): qty 2

## 2012-09-14 MED ORDER — BISACODYL 5 MG PO TBEC
10.0000 mg | DELAYED_RELEASE_TABLET | Freq: Every day | ORAL | Status: DC
  Administered 2012-09-15 – 2012-09-19 (×4): 10 mg via ORAL
  Filled 2012-09-14 (×4): qty 2

## 2012-09-14 MED ORDER — VECURONIUM BROMIDE 10 MG IV SOLR
INTRAVENOUS | Status: DC | PRN
  Administered 2012-09-14 (×2): 2 mg via INTRAVENOUS
  Administered 2012-09-14: 3 mg via INTRAVENOUS

## 2012-09-14 MED ORDER — ONDANSETRON HCL 4 MG/2ML IJ SOLN
4.0000 mg | Freq: Four times a day (QID) | INTRAMUSCULAR | Status: DC | PRN
  Administered 2012-09-15: 4 mg via INTRAVENOUS
  Filled 2012-09-14: qty 2

## 2012-09-14 MED ORDER — NALOXONE HCL 0.4 MG/ML IJ SOLN: 0.4000 mg | INTRAMUSCULAR | Status: DC | PRN

## 2012-09-14 MED ORDER — LIDOCAINE HCL 2 % EX GEL
Freq: Once | CUTANEOUS | Status: DC
  Filled 2012-09-14: qty 5

## 2012-09-14 MED ORDER — POTASSIUM CHLORIDE 10 MEQ/50ML IV SOLN: 10.0000 meq | Freq: Every day | INTRAVENOUS | Status: DC | PRN

## 2012-09-14 MED ORDER — PROPOFOL 10 MG/ML IV BOLUS
INTRAVENOUS | Status: DC | PRN
  Administered 2012-09-14: 30 mg via INTRAVENOUS
  Administered 2012-09-14: 120 mg via INTRAVENOUS

## 2012-09-14 MED ORDER — ALBUTEROL SULFATE HFA 108 (90 BASE) MCG/ACT IN AERS
INHALATION_SPRAY | RESPIRATORY_TRACT | Status: DC | PRN
  Administered 2012-09-14: 2 via RESPIRATORY_TRACT

## 2012-09-14 MED ORDER — LACTATED RINGERS IV SOLN
INTRAVENOUS | Status: DC | PRN
  Administered 2012-09-14 (×2): via INTRAVENOUS

## 2012-09-14 MED ORDER — DIPHENHYDRAMINE HCL 12.5 MG/5ML PO ELIX
12.5000 mg | ORAL_SOLUTION | Freq: Four times a day (QID) | ORAL | Status: DC | PRN
  Filled 2012-09-14: qty 5

## 2012-09-14 MED ORDER — FENTANYL 10 MCG/ML IV SOLN
INTRAVENOUS | Status: DC
  Administered 2012-09-14: 14:00:00 via INTRAVENOUS
  Administered 2012-09-14: 150 ug via INTRAVENOUS
  Administered 2012-09-14: 22:00:00 via INTRAVENOUS
  Administered 2012-09-14: 310.8 ug via INTRAVENOUS
  Administered 2012-09-14: 195 ug via INTRAVENOUS
  Administered 2012-09-15 (×2): 165 ug via INTRAVENOUS
  Administered 2012-09-15: 16:00:00 via INTRAVENOUS
  Administered 2012-09-15: 254 ug via INTRAVENOUS
  Administered 2012-09-15: 90 ug via INTRAVENOUS
  Administered 2012-09-15: 255 ug via INTRAVENOUS
  Administered 2012-09-15: 07:00:00 via INTRAVENOUS
  Administered 2012-09-15: 135 ug via INTRAVENOUS
  Administered 2012-09-16: 195 ug via INTRAVENOUS
  Administered 2012-09-16: 180 ug via INTRAVENOUS
  Administered 2012-09-16: 135 ug via INTRAVENOUS
  Administered 2012-09-16: 120 ug via INTRAVENOUS
  Administered 2012-09-16 (×2): via INTRAVENOUS
  Administered 2012-09-17 (×2): 20 ug via INTRAVENOUS
  Administered 2012-09-17: 120 ug via INTRAVENOUS
  Administered 2012-09-17: 60 ug via INTRAVENOUS
  Administered 2012-09-17: 45 ug via INTRAVENOUS
  Administered 2012-09-17: 60 ug via INTRAVENOUS
  Administered 2012-09-18: 90 ug via INTRAVENOUS
  Administered 2012-09-18: 60 ug via INTRAVENOUS
  Administered 2012-09-18: 15 ug via INTRAVENOUS
  Administered 2012-09-18: 45 ug via INTRAVENOUS
  Administered 2012-09-18: 150 ug via INTRAVENOUS
  Administered 2012-09-18: 09:00:00 via INTRAVENOUS
  Administered 2012-09-18 – 2012-09-19 (×2): 75 ug via INTRAVENOUS
  Administered 2012-09-19: 150 ug via INTRAVENOUS
  Administered 2012-09-19: 30 ug via INTRAVENOUS
  Administered 2012-09-19: 11:00:00 via INTRAVENOUS
  Filled 2012-09-14 (×9): qty 50

## 2012-09-14 MED ORDER — HYDROMORPHONE HCL PF 1 MG/ML IJ SOLN
0.2500 mg | INTRAMUSCULAR | Status: DC | PRN
  Administered 2012-09-14 (×4): 0.5 mg via INTRAVENOUS

## 2012-09-14 MED ORDER — HEMOSTATIC AGENTS (NO CHARGE) OPTIME
TOPICAL | Status: DC | PRN
  Administered 2012-09-14 (×2): 1 via TOPICAL

## 2012-09-14 MED ORDER — SENNOSIDES-DOCUSATE SODIUM 8.6-50 MG PO TABS
1.0000 | ORAL_TABLET | Freq: Every evening | ORAL | Status: DC | PRN
  Filled 2012-09-14: qty 1

## 2012-09-14 SURGICAL SUPPLY — 71 items
BAG DECANTER FOR FLEXI CONT (MISCELLANEOUS) IMPLANT
BLADE SURG 11 STRL SS (BLADE) ×4 IMPLANT
CANISTER SUCTION 2500CC (MISCELLANEOUS) ×4 IMPLANT
CATH KIT ON Q 5IN SLV (PAIN MANAGEMENT) ×4 IMPLANT
CATH ROBINSON RED A/P 22FR (CATHETERS) IMPLANT
CATH THORACIC 28FR (CATHETERS) IMPLANT
CATH THORACIC 36FR (CATHETERS) ×4 IMPLANT
CATH THORACIC 36FR RT ANG (CATHETERS) IMPLANT
CLIP TI MEDIUM 24 (CLIP) ×4 IMPLANT
CLOTH BEACON ORANGE TIMEOUT ST (SAFETY) ×4 IMPLANT
CONN ST 1/4X3/8  BEN (MISCELLANEOUS) ×2
CONN ST 1/4X3/8 BEN (MISCELLANEOUS) ×2 IMPLANT
CONN Y 3/8X3/8X3/8  BEN (MISCELLANEOUS) ×2
CONN Y 3/8X3/8X3/8 BEN (MISCELLANEOUS) ×2 IMPLANT
CONT SPEC 4OZ CLIKSEAL STRL BL (MISCELLANEOUS) ×8 IMPLANT
COVER SURGICAL LIGHT HANDLE (MISCELLANEOUS) ×8 IMPLANT
DRAIN CHANNEL 32F RND 10.7 FF (WOUND CARE) ×4 IMPLANT
DRAPE LAPAROSCOPIC ABDOMINAL (DRAPES) ×4 IMPLANT
DRAPE WARM FLUID 44X44 (DRAPE) ×4 IMPLANT
DRSG TEGADERM 4X4.75 (GAUZE/BANDAGES/DRESSINGS) ×4 IMPLANT
ELECT REM PT RETURN 9FT ADLT (ELECTROSURGICAL) ×4
ELECTRODE REM PT RTRN 9FT ADLT (ELECTROSURGICAL) ×2 IMPLANT
GLOVE ORTHO TXT STRL SZ7.5 (GLOVE) ×8 IMPLANT
GOWN STRL NON-REIN LRG LVL3 (GOWN DISPOSABLE) ×8 IMPLANT
HANDLE UNIV GIA SHORT (MISCELLANEOUS) ×4 IMPLANT
HEMOSTAT SURGICEL 2X14 (HEMOSTASIS) ×8 IMPLANT
KIT BASIN OR (CUSTOM PROCEDURE TRAY) ×4 IMPLANT
KIT ROOM TURNOVER OR (KITS) ×4 IMPLANT
KIT SUCTION CATH 14FR (SUCTIONS) ×4 IMPLANT
NS IRRIG 1000ML POUR BTL (IV SOLUTION) ×8 IMPLANT
PACK CHEST (CUSTOM PROCEDURE TRAY) ×4 IMPLANT
PAD ARMBOARD 7.5X6 YLW CONV (MISCELLANEOUS) ×8 IMPLANT
RELOAD EGIA TRIS TAN 45 CVD (STAPLE) ×20 IMPLANT
RELOAD ENDO GIA 30 2.5 (STAPLE) ×4 IMPLANT
RELOAD TRI 2.0 60 MD THK V SUL (STAPLE) ×16 IMPLANT
RELOAD TRI 2.0 60 XTHK VAS SUL (STAPLE) ×4 IMPLANT
SEALANT SURG COSEAL 4ML (VASCULAR PRODUCTS) IMPLANT
SOLUTION ANTI FOG 6CC (MISCELLANEOUS) ×4 IMPLANT
SPONGE GAUZE 4X4 12PLY (GAUZE/BANDAGES/DRESSINGS) ×4 IMPLANT
SPONGE INTESTINAL PEANUT (DISPOSABLE) ×8 IMPLANT
SPONGE LAP 18X18 X RAY DECT (DISPOSABLE) ×4 IMPLANT
SPONGE TONSIL 1.25 RF SGL STRG (GAUZE/BANDAGES/DRESSINGS) ×8 IMPLANT
STAPLER TA30 4.8 NON-ABS (STAPLE) ×4 IMPLANT
SURGIFLO W/THROMBIN 8M KIT (HEMOSTASIS) ×4 IMPLANT
SUT CHROMIC 3 0 SH 27 (SUTURE) ×16 IMPLANT
SUT ETHILON 3 0 PS 1 (SUTURE) IMPLANT
SUT PROLENE 3 0 SH DA (SUTURE) IMPLANT
SUT PROLENE 4 0 RB 1 (SUTURE)
SUT PROLENE 4-0 RB1 .5 CRCL 36 (SUTURE) IMPLANT
SUT PROLENE 6 0 BV (SUTURE) ×4 IMPLANT
SUT PROLENE 6 0 C 1 30 (SUTURE) ×16 IMPLANT
SUT SILK  1 MH (SUTURE) ×4
SUT SILK 1 MH (SUTURE) ×4 IMPLANT
SUT SILK 2 0SH CR/8 30 (SUTURE) ×4 IMPLANT
SUT SILK 3 0SH CR/8 30 (SUTURE) IMPLANT
SUT VIC AB 1 CTX 18 (SUTURE) ×8 IMPLANT
SUT VIC AB 2 TP1 27 (SUTURE) IMPLANT
SUT VIC AB 2-0 CTX 36 (SUTURE) ×4 IMPLANT
SUT VIC AB 3-0 X1 27 (SUTURE) ×4 IMPLANT
SUT VICRYL 0 UR6 27IN ABS (SUTURE) IMPLANT
SUT VICRYL 2 TP 1 (SUTURE) ×4 IMPLANT
SWAB COLLECTION DEVICE MRSA (MISCELLANEOUS) IMPLANT
SYSTEM SAHARA CHEST DRAIN ATS (WOUND CARE) ×4 IMPLANT
TAPE CLOTH SURG 4X10 WHT LF (GAUZE/BANDAGES/DRESSINGS) ×4 IMPLANT
TIP APPLICATOR SPRAY EXTEND 16 (VASCULAR PRODUCTS) IMPLANT
TOWEL OR 17X24 6PK STRL BLUE (TOWEL DISPOSABLE) ×4 IMPLANT
TOWEL OR 17X26 10 PK STRL BLUE (TOWEL DISPOSABLE) ×8 IMPLANT
TRAP SPECIMEN MUCOUS 40CC (MISCELLANEOUS) IMPLANT
TRAY FOLEY CATH 14FR (SET/KITS/TRAYS/PACK) ×4 IMPLANT
TUBE ANAEROBIC SPECIMEN COL (MISCELLANEOUS) IMPLANT
WATER STERILE IRR 1000ML POUR (IV SOLUTION) ×8 IMPLANT

## 2012-09-14 NOTE — Progress Notes (Signed)
T. CTS p.m. Rounds  Status post right upper lobectomy Patient fairly comfortable on PCA Mild airleak, minimal chest tube drainage Doing well with stable O2 sat

## 2012-09-14 NOTE — Progress Notes (Signed)
The patient was examined and preop studies reviewed. There has been no change from the prior exam and the patient is ready for surgery.  Plan R VATS and pulmonary resection today on R [name]

## 2012-09-14 NOTE — Progress Notes (Signed)
Rec'd report from Radiology, R.mid to lower PTX, Dr.VanTrigt at bedside, updated on CXR results, no new orders rec'd, awaiting ABG results.

## 2012-09-14 NOTE — Transfer of Care (Signed)
Immediate Anesthesia Transfer of Care Note  Patient: [name]  Procedure(s) Performed: Procedure(s) with comments: LOBECTOMY (Right) - RIGHT UPPER LOBECTOMY VIDEO ASSISTED THORACOSCOPY (Right)  Patient Location: PACU  Anesthesia Type:General  Level of Consciousness: awake and alert   Airway & Oxygen Therapy: Patient Spontanous Breathing and Patient connected to face mask oxygen  Post-op Assessment: Report given to PACU RN, Post -op Vital signs reviewed and stable and Patient moving all extremities  Post vital signs: Reviewed and stable  Complications: No apparent anesthesia complications

## 2012-09-14 NOTE — Anesthesia Procedure Notes (Signed)
Procedure Name: Intubation Date/Time: 09/14/2012 8:00 AM Performed by: Margaree Mackintosh Pre-anesthesia Checklist: Patient identified, Timeout performed, Emergency Drugs available, Suction available and Patient being monitored Patient Re-evaluated:Patient Re-evaluated prior to inductionOxygen Delivery Method: Circle system utilized Preoxygenation: Pre-oxygenation with 100% oxygen Intubation Type: IV induction Ventilation: Oral airway inserted - appropriate to patient size and Mask ventilation without difficulty Laryngoscope Size: Mac and 4 Grade View: Grade I Tube type: Oral Endobronchial tube: Double lumen EBT, EBT position confirmed by fiberoptic bronchoscope, EBT position confirmed by auscultation and Left and 39 Fr Number of attempts: 1 Airway Equipment and Method: Stylet and LTA kit utilized Placement Confirmation: ETT inserted through vocal cords under direct vision,  positive ETCO2 and breath sounds checked- equal and bilateral Tube secured with: Tape Dental Injury: Teeth and Oropharynx as per pre-operative assessment

## 2012-09-14 NOTE — Brief Op Note (Signed)
   [address]       [place] [address]             [phone]   09/14/2012  12:11 PM  PATIENT:  [name]  72 y.o. male  PRE-OPERATIVE DIAGNOSIS:  LUNG CANCER right lung  POST-OPERATIVE DIAGNOSIS:  LUNG CANCER right lung  PROCEDURE:  Procedure(s): LOBECTOMY(rul) VIDEO ASSISTED THORACOSCOPY  SURGEON:  Surgeon(s): Kerin Perna, MD  PHYSICIAN ASSISTANT: WAYNE GOLD PA-C  ANESTHESIA:   general  SPECIMEN:  Source of Specimen:  RIGHT UPPER LOBE  DISPOSITION OF SPECIMEN:  Pathology  DRAINS: 2 Chest Tube(s) in the RIGHT CHEST   PATIENT CONDITION:  PACU - hemodynamically stable.  PRE-OPERATIVE WEIGHT: 78kg  COMPLICATIONS: NO KNOWN

## 2012-09-14 NOTE — Preoperative (Signed)
Beta Blockers   Reason not to administer Beta Blockers:Not Applicable 

## 2012-09-14 NOTE — Progress Notes (Signed)
Report given to Sharon RN, assuming care of patient  

## 2012-09-14 NOTE — Anesthesia Preprocedure Evaluation (Signed)
Anesthesia Evaluation  Patient identified by MRN, date of birth, ID band Patient awake    Reviewed: Allergy & Precautions, H&P , NPO status , Patient's Chart, lab work & pertinent test results  Airway Mallampati: I TM Distance: >3 FB Neck ROM: full    Dental   Pulmonary COPD         Cardiovascular hypertension, + Peripheral Vascular Disease Rhythm:regular Rate:Normal     Neuro/Psych    GI/Hepatic hiatal hernia, GERD-  ,  Endo/Other    Renal/GU      Musculoskeletal   Abdominal   Peds  Hematology   Anesthesia Other Findings   Reproductive/Obstetrics                           Anesthesia Physical Anesthesia Plan  ASA: III  Anesthesia Plan: General   Post-op Pain Management:    Induction: Intravenous  Airway Management Planned: Double Lumen EBT  Additional Equipment: Arterial line and CVP  Intra-op Plan:   Post-operative Plan: Possible Post-op intubation/ventilation  Informed Consent: I have reviewed the patients History and Physical, chart, labs and discussed the procedure including the risks, benefits and alternatives for the proposed anesthesia with the patient or authorized representative who has indicated his/her understanding and acceptance.     Plan Discussed with: CRNA, Anesthesiologist and Surgeon  Anesthesia Plan Comments:         Anesthesia Quick Evaluation

## 2012-09-15 ENCOUNTER — Inpatient Hospital Stay (HOSPITAL_COMMUNITY): Payer: Medicare Other

## 2012-09-15 LAB — CBC
HCT: 32.6 % — ABNORMAL LOW (ref 39.0–52.0)
Hemoglobin: 10.8 g/dL — ABNORMAL LOW (ref 13.0–17.0)
MCH: 30.4 pg (ref 26.0–34.0)
MCHC: 33.1 g/dL (ref 30.0–36.0)
RDW: 14.2 % (ref 11.5–15.5)

## 2012-09-15 LAB — BASIC METABOLIC PANEL
Chloride: 100 mEq/L (ref 96–112)
GFR calc Af Amer: >90 mL/min (ref >90)
GFR calc non Af Amer: >90 mL/min (ref >90)
Glucose, Bld: 122 mg/dL — ABNORMAL HIGH (ref 70–99)
Potassium: 4.2 mEq/L (ref 3.5–5.1)
Sodium: 134 mEq/L — ABNORMAL LOW (ref 135–145)

## 2012-09-15 MED ORDER — LEVALBUTEROL TARTRATE 45 MCG/ACT IN AERO: 2.0000 | INHALATION_SPRAY | RESPIRATORY_TRACT | Status: DC | PRN

## 2012-09-15 MED ORDER — LEVALBUTEROL TARTRATE 45 MCG/ACT IN AERO
2.0000 | INHALATION_SPRAY | Freq: Four times a day (QID) | RESPIRATORY_TRACT | Status: DC
  Filled 2012-09-15: qty 15

## 2012-09-15 MED ORDER — DEXTROSE-NACL 5-0.9 % IV SOLN: INTRAVENOUS | Status: DC

## 2012-09-15 MED ORDER — DEXTROSE 5 % IV SOLN
1.0000 g | Freq: Three times a day (TID) | INTRAVENOUS | Status: DC
  Administered 2012-09-15 – 2012-09-20 (×15): 1 g via INTRAVENOUS
  Filled 2012-09-15 (×18): qty 1

## 2012-09-15 MED ORDER — FUROSEMIDE 10 MG/ML IJ SOLN
20.0000 mg | Freq: Every day | INTRAMUSCULAR | Status: AC
  Administered 2012-09-15 – 2012-09-16 (×2): 20 mg via INTRAVENOUS
  Filled 2012-09-15 (×2): qty 2

## 2012-09-15 MED ORDER — ENOXAPARIN SODIUM 30 MG/0.3ML ~~LOC~~ SOLN
40.0000 mg | SUBCUTANEOUS | Status: DC
  Administered 2012-09-15 – 2012-09-19 (×5): 40 mg via SUBCUTANEOUS
  Filled 2012-09-15 (×6): qty 0.4

## 2012-09-15 NOTE — Progress Notes (Signed)
1 Day Post-Op Procedure(s) (LRB): LOBECTOMY (Right) VIDEO ASSISTED THORACOSCOPY (Right) Subjective: RULlobectomy for adenoca Heavy smlking hx, COPD Mild air leak, CXR clear Hx CAD- preop cath-- chronic RCA occlusion, collateralized  Objective: Vital signs in last 24 hours: Temp:  [96 F (35.6 C)-99 F (37.2 C)] 98 F (36.7 C) (03/07 0800) Pulse Rate:  [74-105] 93 (03/07 0800) Cardiac Rhythm:  [-] Normal sinus rhythm (03/07 0800) Resp:  [13-37] 16 (03/07 0800) BP: (90-161)/(61-82) 146/81 mmHg (03/07 0600) SpO2:  [91 %-100 %] 100 % (03/07 0818) Arterial Line BP: (129-187)/(59-82) 131/65 mmHg (03/07 0800) Weight:  [178 lb 9.2 oz (81 kg)] 178 lb 9.2 oz (81 kg) (03/07 0600)  Hemodynamic parameters for last 24 hours:  nsr  Intake/Output from previous day: 03/06 0701 - 03/07 0700 In: 7829.5 [P.O.:1110; I.V.:4509.2; IV Piggyback:350] Out: 2445 [Urine:1335; Blood:600; Chest Tube:510] Intake/Output this shift: Total I/O In: 150.4 [I.V.:150.4] Out: 160 [Urine:60; Chest Tube:100]  Lungs clear  Lab Results:  Recent Labs  09/14/12 1058 09/15/12 0345  WBC  --  9.7  HGB 10.9* 10.8*  HCT 32.0* 32.6*  PLT  --  289   BMET:  Recent Labs  09/14/12 1058 09/15/12 0345  NA 135 134*  K 4.6 4.2  CL  --  100  CO2  --  27  GLUCOSE 124* 122*  BUN  --  8  CREATININE  --  0.71  CALCIUM  --  8.2*    PT/INR: No results found for this basename: LABPROT, INR,  in the last 72 hours ABG    Component Value Date/Time   PHART 7.351 09/14/2012 1809   HCO3 28.0* 09/14/2012 1809   TCO2 30 09/14/2012 1809   O2SAT 94.0 09/14/2012 1809   CBG (last 3)  No results found for this basename: GLUCAP,  in the last 72 hours  Assessment/Plan: S/P Procedure(s) (LRB): LOBECTOMY (Right) VIDEO ASSISTED THORACOSCOPY (Right) Plan for transfer to step-down: see transfer orders Leave both chest tubes  LOS: 1 day    VAN TRIGT III,PETER 09/15/2012

## 2012-09-15 NOTE — Progress Notes (Signed)
Utilization review completed.  P.J. Minnich,RN,BSN Case Manager [phone]  

## 2012-09-15 NOTE — Op Note (Signed)
NAMESENDER, RUEB NO.:  [id number]  MEDICAL RECORD NO.:  [id number]  LOCATION:  2315                         FACILITY:  [facility]  PHYSICIAN:  Kerin Perna, M.D.  DATE OF BIRTH:  1941/05/27  DATE OF PROCEDURE:  09/14/2012 DATE OF DISCHARGE:                              OPERATIVE REPORT   OPERATIONS: 1. Right VATS (video-assisted thoracoscopic surgery) with right upper     lobectomy. 2. Placement of On-Q wound analgesia irrigation system.  PREOPERATIVE DIAGNOSES: 1. Adenocarcinoma of the right upper lobe. 2. Chronic obstructive pulmonary disease from active smoking.  POSTOPERATIVE DIAGNOSES: 1. Adenocarcinoma of the right upper lobe. 2. Chronic obstructive pulmonary disease from active smoking.  SURGEON:  Kerin Perna, MD  ASSISTANT:  Rowe Clack, [facility]  ANESTHESIA:  General by Maren Beach, MD  INDICATIONS:  The patient is a 72 year old Caucasian male, smoker, with recently diagnosed right upper lobe mass.  Transbronchial biopsy by Dr. Shelle Iron demonstrated adenocarcinoma.  PET scan showed no evidence of metastatic disease and a brain MRI was negative.  It was a clinical stage I lung cancer and surgical resection was recommended, but had an increased risk due to his active smoking and COPD.  He also underwent cardiac catheterization and Cardiology evaluation prior to surgery by Dr. Eldridge Dace.  He demonstrated total occlusion of the right coronary with collateralization and nonsignificant disease of the LAD and circumflex with EF of 50%.  Prior to surgery, I examined the patient on several occasions in the office and had reviewed the results of his chest CT scan, lung biopsy, pathology, PFTs, and his cardiac cath films.  I discussed the procedure of right VATS and right upper lobectomy with the patient and family including the location of the surgical incision, the use of general anesthesia, and expected postoperative recovery.  I discussed  with him the risks, including the risks of pneumonia, air leak, bleeding, blood transfusion requirement, and death.  He understood these issues and agreed to proceed with surgery under what I felt was an informed consent.  OPERATIVE FINDINGS: 1. Large mass in the posterior segment of the right upper lobe. 2. Incomplete minor fissure between the middle lobe and upper lobe. 3. Frozen section showed no evidence of tumor at the bronchial margin     or on the pleural margin.  OPERATIVE PROCEDURE IN DETAIL:  The patient was brought to the operating room and placed supine on the operating table where general anesthesia was induced.  A double-lumen endotracheal tube was positioned by the anesthesiologist.  The patient was turned right side up and prepped and draped as a sterile field.  A proper time-out was performed.  A small incision was made at the tip of the scapula and the scope was inserted. The lung was fairly well inflated and the endotracheal double-lumen tube was repositioned by the anesthesiologist.  Chest was examined and there were some adhesions of the upper lobe to the chest wall, but otherwise minimal adhesions.  The camera was withdrawn.  The incision was extended to approximately 7 to 8 cm.  The ribs were gently spread.  The lung was inspected and palpated.  The mass is palpable  in the posterior aspect.  The adhesions between the upper lobe and the chest wall were taken down using cautery. The lung was fully mobilized.  The hilum showed no evidence of mass or specific adenopathy.  The pleura was incised parallel to the azygos vein, extending over to the anterior right hilum.  The two superior pulmonary veins to the upper lobe were encircled with a vessel loop and stapled and divided with the Endo-GIA stapling device.  The main pulmonary artery to the upper lobe was identified and circled the vessel loop and stapled and divided with an Endo-GIA stapling device.  The vessels  to the middle lobe were preserved.  Attention was directed to the major fissure and the pulmonary artery was identified.  The posterior ascending branch was not located at the area of the superior segmental branch to the lower lobe.  The fissure was opened, and the pulmonary artery was completely dissected.  A small ascending branch was found and was doubly clipped and divided.  The fissure was completed, separating the upper lobe from the middle lobe and the upper lobe from the lower lobe.  Endo-GIA stapling devices were used for this stapling anteriorly and then posteriorly.  Care was being taken to confirm that the pulmonary artery and pulmonary veins to the middle lobe were preserved as the middle lobe was not distinct from the upper lobe due to lack of the fissure.  The right mainstem bronchus was dissected from the posterior approach. It was encircled with a vessel loop.  The TA 30 stapler using the 3.8-mm staple load was then used.  Before we stapled off the bronchus, it was occluded and the anesthetist inflated the right lung and demonstrated inflation in the middle lobe and lower lobe, but not the upper lobe. The staple line was then completed and the bronchus was transected with a scalpel and the specimen removed.  The staple line was inspected and the bronchus was intact.  The inferior ligament was taken down to mobilize the lower lobe to fill the space left by the right upper lobectomy.  Warm saline was placed into the right hemithorax and lungs were gently ventilated to demonstrate no significant air leaks.  Hemostasis was then obtained.  The chest wall was cauterized for the adhesions that had been taken down.  The mediastinal lymph nodes were removed with the specimen.  The middle lobe was then attached to the lower lobe where there was a distinct fissure using interrupted 3-0 chromic to prevent torsion of the middle lobe.  Chest tubes were placed anteriorly and  posteriorly in the right hemithorax and brought out through separate incisions and secured to the skin.  The lung was re-expanded under direct vision and filled the hemithorax nicely.  #2 pericostal Vicryl sutures were used to reapproximate the ribs.  The muscle layer was closed with interrupted #1 Vicryl in layers for the latissimus and trapezius.  The subcutaneous layer and skin layers were closed in running Vicryl.  An On-Q catheter was placed below the main thoracotomy incision and above the chest tube in 3 sites.  It was secured to the skin and connected with 0.5% Marcaine reservoir. The patient was then turned supine and the chest tubes were connected to an underwater seal Pleur-Evac collection system.  The patient was extubated and then returned to the recovery room in stable condition. Pathology frozen section confirmed that the bronchial margin was negative and the pleural margin was negative for cancer.     Theron Arista  Donata Clay, M.D.     PV/MEDQ  D:  09/14/2012  T:  09/15/2012  Job:  [id number]  cc:   Barbaraann Share, MD,FCCP

## 2012-09-15 NOTE — Anesthesia Postprocedure Evaluation (Signed)
  Anesthesia Post-op Note  Patient: [name]  Procedure(s) Performed: Procedure(s) with comments: LOBECTOMY (Right) - RIGHT UPPER LOBECTOMY VIDEO ASSISTED THORACOSCOPY (Right)  Patient Location: PACU  Anesthesia Type:General  Level of Consciousness: awake, oriented and patient cooperative  Airway and Oxygen Therapy: Patient Spontanous Breathing  Post-op Pain: moderate  Post-op Assessment: Post-op Vital signs reviewed, Patient's Cardiovascular Status Stable, Respiratory Function Stable, Patent Airway, No signs of Nausea or vomiting and Pain level controlled  Post-op Vital Signs: stable  Complications: No apparent anesthesia complications

## 2012-09-15 NOTE — Progress Notes (Signed)
Up in chair  BP 137/75  Pulse 99  Temp(Src) 98.5 F (36.9 C) (Oral)  Resp 24  Ht 5\' 9"  (1.753 m)  Wt 178 lb 9.2 oz (81 kg)  BMI 26.36 kg/m2  SpO2 93%   Intake/Output Summary (Last 24 hours) at 09/15/12 1831 Last data filed at 09/15/12 1800  Gross per 24 hour  Intake 4154.48 ml  Output   1940 ml  Net 2214.48 ml    Awaiting transfer

## 2012-09-16 ENCOUNTER — Inpatient Hospital Stay (HOSPITAL_COMMUNITY): Payer: Medicare Other

## 2012-09-16 LAB — CBC
Hemoglobin: 10.4 g/dL — ABNORMAL LOW (ref 13.0–17.0)
RBC: 3.43 MIL/uL — ABNORMAL LOW (ref 4.22–5.81)

## 2012-09-16 LAB — COMPREHENSIVE METABOLIC PANEL
ALT: 19 U/L (ref 0–53)
CO2: 29 mEq/L (ref 19–32)
Glucose, Bld: 106 mg/dL — ABNORMAL HIGH (ref 70–99)
Potassium: 4.2 mEq/L (ref 3.5–5.1)
Sodium: 131 mEq/L — ABNORMAL LOW (ref 135–145)

## 2012-09-16 MED ORDER — LEVALBUTEROL HCL 1.25 MG/0.5ML IN NEBU
0.6300 mg | INHALATION_SOLUTION | RESPIRATORY_TRACT | Status: DC | PRN
  Filled 2012-09-16: qty 0.26

## 2012-09-16 MED ORDER — GUAIFENESIN ER 600 MG PO TB12
1200.0000 mg | ORAL_TABLET | Freq: Two times a day (BID) | ORAL | Status: DC
  Administered 2012-09-16 – 2012-09-19 (×8): 1200 mg via ORAL
  Filled 2012-09-16 (×11): qty 2

## 2012-09-16 MED ORDER — LEVALBUTEROL HCL 1.25 MG/0.5ML IN NEBU
1.2500 mg | INHALATION_SOLUTION | RESPIRATORY_TRACT | Status: DC
  Administered 2012-09-16 – 2012-09-18 (×15): 1.25 mg via RESPIRATORY_TRACT
  Filled 2012-09-16 (×19): qty 0.5

## 2012-09-16 MED ORDER — ACETYLCYSTEINE 20 % IN SOLN
4.0000 mL | Freq: Three times a day (TID) | RESPIRATORY_TRACT | Status: AC
  Administered 2012-09-16 (×3): 4 mL via RESPIRATORY_TRACT
  Filled 2012-09-16 (×9): qty 4

## 2012-09-16 MED ORDER — LEVALBUTEROL HCL 0.63 MG/3ML IN NEBU
0.6300 mg | INHALATION_SOLUTION | RESPIRATORY_TRACT | Status: DC | PRN
  Administered 2012-09-16: 0.63 mg via RESPIRATORY_TRACT
  Filled 2012-09-16: qty 3

## 2012-09-16 MED ORDER — LEVALBUTEROL HCL 1.25 MG/0.5ML IN NEBU
1.2500 mg | INHALATION_SOLUTION | RESPIRATORY_TRACT | Status: DC | PRN
  Administered 2012-09-16: 1.25 mg via RESPIRATORY_TRACT
  Filled 2012-09-16: qty 0.5

## 2012-09-16 MED ORDER — IPRATROPIUM BROMIDE 0.02 % IN SOLN
0.5000 mg | RESPIRATORY_TRACT | Status: DC
  Administered 2012-09-16 – 2012-09-18 (×15): 0.5 mg via RESPIRATORY_TRACT
  Filled 2012-09-16 (×15): qty 2.5

## 2012-09-16 NOTE — Progress Notes (Signed)
2 Days Post-Op Procedure(s) (LRB): LOBECTOMY (Right) VIDEO ASSISTED THORACOSCOPY (Right) Subjective: C/o shortness of breath, unable to cough up mucous  Objective: Vital signs in last 24 hours: Temp:  [97.4 F (36.3 C)-98.9 F (37.2 C)] 98.9 F (37.2 C) (03/08 0712) Pulse Rate:  [86-107] 103 (03/08 0700) Cardiac Rhythm:  [-] Sinus tachycardia (03/08 0400) Resp:  [10-30] 30 (03/08 0747) BP: (106-147)/(60-81) 147/81 mmHg (03/08 0700) SpO2:  [88 %-100 %] 91 % (03/08 0747) Arterial Line BP: (129-131)/(64-65) 129/64 mmHg (03/07 0900) Weight:  [178 lb 9.2 oz (81 kg)-184 lb 4.9 oz (83.6 kg)] 184 lb 4.9 oz (83.6 kg) (03/08 0600)  Hemodynamic parameters for last 24 hours:    Intake/Output from previous day: 03/07 0701 - 03/08 0700 In: 2216.9 [P.O.:1280; I.V.:686.9; IV Piggyback:250] Out: 2415 [Urine:2105; Chest Tube:310] Intake/Output this shift:    General appearance: alert and mild distress Neurologic: intact Heart: tachy, regular Lungs: markedly diminished BS on R, wheezing bilaterally minimal air leak  Lab Results:  Recent Labs  09/15/12 0345 09/16/12 0420  WBC 9.7 10.1  HGB 10.8* 10.4*  HCT 32.6* 31.2*  PLT 289 286   BMET:  Recent Labs  09/15/12 0345 09/16/12 0420  NA 134* 131*  K 4.2 4.2  CL 100 97  CO2 27 29  GLUCOSE 122* 106*  BUN 8 8  CREATININE 0.71 0.78  CALCIUM 8.2* 8.6    PT/INR: No results found for this basename: LABPROT, INR,  in the last 72 hours ABG    Component Value Date/Time   PHART 7.351 09/14/2012 1809   HCO3 28.0* 09/14/2012 1809   TCO2 30 09/14/2012 1809   O2SAT 94.0 09/14/2012 1809   CBG (last 3)  No results found for this basename: GLUCAP,  in the last 72 hours  Assessment/Plan: S/P Procedure(s) (LRB): LOBECTOMY (Right) VIDEO ASSISTED THORACOSCOPY (Right) POD # 2 CV- tachycardic, likely secondary to respiratory issues  RESP_ has marked volume loss with atelectasis on right side  Continue IS, flutter  Add mucomyst and  humibid  Keep both CT today  RENAL- mild hyponatremia  DVT proph- lovenox     LOS: 2 days    [name] C 09/16/2012

## 2012-09-16 NOTE — Progress Notes (Signed)
Feels better this afternoon  BP 144/80  Pulse 103  Temp(Src) 97.8 F (36.6 C) (Oral)  Resp 22  Ht 5\' 9"  (1.753 m)  Wt 184 lb 4.9 oz (83.6 kg)  BMI 27.2 kg/m2  SpO2 100%  Moving air better on the right side, still mild wheezing

## 2012-09-16 NOTE — Progress Notes (Signed)
0730 pt in respiratory distress, audible wheezing, trying to cough. Unable to get pt to quit coughing and calm down, wife at bedside. Dr. Dorris Fetch in and aware. Added humidifier to O2 cannula and increased to 6L/Min, RT paged for treatment

## 2012-09-17 ENCOUNTER — Inpatient Hospital Stay (HOSPITAL_COMMUNITY): Payer: Medicare Other

## 2012-09-17 LAB — BASIC METABOLIC PANEL
BUN: 8 mg/dL (ref 6–23)
CO2: 31 mEq/L (ref 19–32)
Chloride: 93 mEq/L — ABNORMAL LOW (ref 96–112)
GFR calc non Af Amer: >90 mL/min (ref >90)
Glucose, Bld: 108 mg/dL — ABNORMAL HIGH (ref 70–99)
Potassium: 4 mEq/L (ref 3.5–5.1)
Sodium: 132 mEq/L — ABNORMAL LOW (ref 135–145)

## 2012-09-17 LAB — CBC
HCT: 31.6 % — ABNORMAL LOW (ref 39.0–52.0)
Hemoglobin: 10.6 g/dL — ABNORMAL LOW (ref 13.0–17.0)
MCH: 30.5 pg (ref 26.0–34.0)
MCHC: 33.5 g/dL (ref 30.0–36.0)
RBC: 3.48 MIL/uL — ABNORMAL LOW (ref 4.22–5.81)

## 2012-09-17 MED ORDER — BISACODYL 10 MG RE SUPP
10.0000 mg | Freq: Once | RECTAL | Status: AC
  Administered 2012-09-17: 10 mg via RECTAL

## 2012-09-17 NOTE — Progress Notes (Addendum)
3 Days Post-Op Procedure(s) (LRB): LOBECTOMY (Right) VIDEO ASSISTED THORACOSCOPY (Right) Subjective:  [name] states he is doing okay.  He continues to have a productive cough with blood tinged sputum.  Pain is well controlled  Objective: Vital signs in last 24 hours: Temp:  [97.8 F (36.6 C)-98.6 F (37 C)] 98.2 F (36.8 C) (03/09 0720) Pulse Rate:  [83-112] 104 (03/09 1100) Cardiac Rhythm:  [-] Normal sinus rhythm (03/09 0746) Resp:  [11-26] 18 (03/09 1100) BP: (113-166)/(52-92) 137/70 mmHg (03/09 1100) SpO2:  [92 %-100 %] 99 % (03/09 1100) Weight:  [179 lb 10.8 oz (81.5 kg)] 179 lb 10.8 oz (81.5 kg) (03/09 0600)  Intake/Output from previous day: 03/08 0701 - 03/09 0700 In: 1739 [P.O.:1080; I.V.:509; IV Piggyback:150] Out: 4150 [Urine:3840; Chest Tube:310] Intake/Output this shift: Total I/O In: 86 [I.V.:86] Out: 450 [Urine:400; Chest Tube:50]  General appearance: alert, cooperative and no distress Heart: regular rate and rhythm Lungs: diminished breath sounds on right and wheezes bilaterally Abdomen: soft, non-tender; bowel sounds normal; no masses,  no organomegaly Wound: clean and dry  Lab Results:  Recent Labs  09/16/12 0420 09/17/12 0500  WBC 10.1 9.4  HGB 10.4* 10.6*  HCT 31.2* 31.6*  PLT 286 299   BMET:  Recent Labs  09/16/12 0420 09/17/12 0500  NA 131* 132*  K 4.2 4.0  CL 97 93*  CO2 29 31  GLUCOSE 106* 108*  BUN 8 8  CREATININE 0.78 0.72  CALCIUM 8.6 8.9    PT/INR: No results found for this basename: LABPROT, INR,  in the last 72 hours ABG    Component Value Date/Time   PHART 7.351 09/14/2012 1809   HCO3 28.0* 09/14/2012 1809   TCO2 30 09/14/2012 1809   O2SAT 94.0 09/14/2012 1809   CBG (last 3)  No results found for this basename: GLUCAP,  in the last 72 hours  Assessment/Plan: S/P Procedure(s) (LRB): LOBECTOMY (Right) VIDEO ASSISTED THORACOSCOPY (Right)  1. Chest tubes in place- no air leak appreciated this morning, CXR shows  improvement of aeration of right side no pneumothorax 2. Pulm- + sputum production, on oxygen wean as tolerated, continue nebs, IS 3. Dispo- will place chest tubes to water seal, continue current care   LOS: 3 days    Lowella Dandy 09/17/2012  1900 Looks much better today No air leak- CT to water seal Can probably transfer in AM

## 2012-09-17 NOTE — Progress Notes (Signed)
Performed nasotracheal suction. One successful pass through right nare yeilded moderate amount of thick yellow secretions. Met resistance and unable to make 2nd pass in right nare. Attempted insertion of nasal trumpet in left nare. Encountered resistance and patient is unable to tolerate continued attempts. Adverse effects included a small amount of epistaxis, pain and anxiety. Patient is currently stable. RT will continue to monitor.

## 2012-09-18 ENCOUNTER — Encounter (HOSPITAL_COMMUNITY): Payer: Self-pay | Admitting: Cardiothoracic Surgery

## 2012-09-18 ENCOUNTER — Inpatient Hospital Stay (HOSPITAL_COMMUNITY): Payer: Medicare Other

## 2012-09-18 LAB — TYPE AND SCREEN
ABO/RH(D): A POS
Antibody Screen: NEGATIVE
Unit division: 0

## 2012-09-18 MED ORDER — LEVALBUTEROL HCL 1.25 MG/0.5ML IN NEBU
1.2500 mg | INHALATION_SOLUTION | Freq: Three times a day (TID) | RESPIRATORY_TRACT | Status: DC
  Administered 2012-09-19 – 2012-09-20 (×4): 1.25 mg via RESPIRATORY_TRACT
  Filled 2012-09-18 (×7): qty 0.5

## 2012-09-18 MED ORDER — IPRATROPIUM BROMIDE 0.02 % IN SOLN
0.5000 mg | Freq: Three times a day (TID) | RESPIRATORY_TRACT | Status: DC
  Administered 2012-09-19 – 2012-09-20 (×4): 0.5 mg via RESPIRATORY_TRACT
  Filled 2012-09-18 (×4): qty 2.5

## 2012-09-18 MED ORDER — FUROSEMIDE 10 MG/ML IJ SOLN
40.0000 mg | Freq: Once | INTRAMUSCULAR | Status: AC
  Administered 2012-09-18: 40 mg via INTRAVENOUS
  Filled 2012-09-18: qty 4

## 2012-09-18 NOTE — Progress Notes (Signed)
Patient transferred via wheelchair with propak and O2.  Family at bedside.  Tolerated transfer well.  Placed in chair and on monitor with O2 in 3315.  Vitals stable.

## 2012-09-18 NOTE — Progress Notes (Signed)
4 Days Post-Op Procedure(s) (LRB): LOBECTOMY (Right) VIDEO ASSISTED THORACOSCOPY (Right) Subjective: Postop lobectomy with severe COPD, prob pneumomia CXR R hilar infiltrate One CT out today airleak min Wet cough   Objective: Vital signs in last 24 hours: Temp:  [97.6 F (36.4 C)-98.8 F (37.1 C)] 98 F (36.7 C) (03/10 0738) Pulse Rate:  [78-115] 81 (03/10 0700) Cardiac Rhythm:  [-] Normal sinus rhythm (03/10 0700) Resp:  [13-28] 15 (03/10 0700) BP: (114-166)/(46-96) 133/62 mmHg (03/10 0700) SpO2:  [94 %-99 %] 97 % (03/10 0835) Weight:  [179 lb 7.3 oz (81.4 kg)] 179 lb 7.3 oz (81.4 kg) (03/10 0351)  Hemodynamic parameters for last 24 hours:  sinus  Intake/Output from previous day: 03/09 0701 - 03/10 0700 In: 1586.5 [P.O.:960; I.V.:476.5; IV Piggyback:150] Out: 2701 [Urine:2650; Stool:1; Chest Tube:50] Intake/Output this shift:    Coarse bs R>L  Lab Results:  Recent Labs  09/16/12 0420 09/17/12 0500  WBC 10.1 9.4  HGB 10.4* 10.6*  HCT 31.2* 31.6*  PLT 286 299   BMET:  Recent Labs  09/16/12 0420 09/17/12 0500  NA 131* 132*  K 4.2 4.0  CL 97 93*  CO2 29 31  GLUCOSE 106* 108*  BUN 8 8  CREATININE 0.78 0.72  CALCIUM 8.6 8.9    PT/INR: No results found for this basename: LABPROT, INR,  in the last 72 hours ABG    Component Value Date/Time   PHART 7.351 09/14/2012 1809   HCO3 28.0* 09/14/2012 1809   TCO2 30 09/14/2012 1809   O2SAT 94.0 09/14/2012 1809   CBG (last 3)  No results found for this basename: GLUCAP,  in the last 72 hours  Assessment/Plan: S/P Procedure(s) (LRB): LOBECTOMY (Right) VIDEO ASSISTED THORACOSCOPY (Right) Plan for transfer to step-down: see transfer orders   LOS: 4 days    VAN TRIGT III,PETER 09/18/2012

## 2012-09-18 NOTE — Plan of Care (Signed)
Problem: Phase I Progression Outcomes Goal: Initial discharge plan identified Outcome: Completed/Met Date Met:  09/18/12 To discharge home with wife.

## 2012-09-19 ENCOUNTER — Inpatient Hospital Stay (HOSPITAL_COMMUNITY): Payer: Medicare Other

## 2012-09-19 LAB — BASIC METABOLIC PANEL
BUN: 9 mg/dL (ref 6–23)
CO2: 32 mEq/L (ref 19–32)
Calcium: 8.9 mg/dL (ref 8.4–10.5)
Chloride: 96 mEq/L (ref 96–112)
Creatinine, Ser: 0.65 mg/dL (ref 0.50–1.35)
GFR calc Af Amer: >90 mL/min (ref >90)
GFR calc non Af Amer: >90 mL/min (ref >90)
Glucose, Bld: 108 mg/dL — ABNORMAL HIGH (ref 70–99)
Potassium: 3.3 mEq/L — ABNORMAL LOW (ref 3.5–5.1)
Sodium: 134 mEq/L — ABNORMAL LOW (ref 135–145)

## 2012-09-19 LAB — CBC
HCT: 29.2 % — ABNORMAL LOW (ref 39.0–52.0)
Hemoglobin: 9.8 g/dL — ABNORMAL LOW (ref 13.0–17.0)
MCH: 30.4 pg (ref 26.0–34.0)
MCHC: 33.6 g/dL (ref 30.0–36.0)
MCV: 90.7 fL (ref 78.0–100.0)
Platelets: 355 10*3/uL (ref 150–400)
RBC: 3.22 MIL/uL — ABNORMAL LOW (ref 4.22–5.81)
RDW: 14.4 % (ref 11.5–15.5)
WBC: 6.9 10*3/uL (ref 4.0–10.5)

## 2012-09-19 MED ORDER — GUAIFENESIN ER 600 MG PO TB12: 1200.0000 mg | ORAL_TABLET | Freq: Two times a day (BID) | ORAL | Status: DC

## 2012-09-19 MED ORDER — OXYCODONE-ACETAMINOPHEN 5-325 MG PO TABS: 1.0000 | ORAL_TABLET | ORAL | Status: DC | PRN

## 2012-09-19 MED ORDER — POTASSIUM CHLORIDE CRYS ER 20 MEQ PO TBCR
EXTENDED_RELEASE_TABLET | ORAL | Status: AC
  Administered 2012-09-19: 40 meq via ORAL
  Filled 2012-09-19: qty 2

## 2012-09-19 MED ORDER — POTASSIUM CHLORIDE CRYS ER 20 MEQ PO TBCR: 40.0000 meq | EXTENDED_RELEASE_TABLET | Freq: Once | ORAL | Status: AC

## 2012-09-19 NOTE — Progress Notes (Addendum)
                   [address]            [name] [address]          [phone]    5 Days Post-Op Procedure(s) (LRB): LOBECTOMY (Right) VIDEO ASSISTED THORACOSCOPY (Right)  Subjective: Patient hopes remaining chest tube is going to be removed today. Is requesting breathing treatments. Has productive cough  Objective: Vital signs in last 24 hours: Temp:  [97.3 F (36.3 C)-99.5 F (37.5 C)] 98.2 F (36.8 C) (03/11 0800) Pulse Rate:  [78-103] 100 (03/11 0402) Cardiac Rhythm:  [-] Normal sinus rhythm (03/11 0402) Resp:  [17-29] 28 (03/11 0800) BP: (134-142)/(61-78) 141/69 mmHg (03/11 0402) SpO2:  [91 %-98 %] 92 % (03/11 0800) FiO2 (%):  [2 %] 2 % (03/10 0905)     Intake/Output from previous day: 03/10 0701 - 03/11 0700 In: 850 [P.O.:360; I.V.:440; IV Piggyback:50] Out: 1121 [Urine:1100; Stool:1; Chest Tube:20]   Physical Exam:  Cardiovascular: RRR Pulmonary: Some rhonchi and coarse breath sounds on the right Abdomen: Soft, non tender, mildly distended,bowel sounds present. Wounds: Clean and dry.  No erythema or signs of infection. Chest Tube:  on water seal,no air leak  Lab Results: CBC: Recent Labs  09/17/12 0500 09/19/12 0356  WBC 9.4 6.9  HGB 10.6* 9.8*  HCT 31.6* 29.2*  PLT 299 355   BMET:  Recent Labs  09/17/12 0500 09/19/12 0356  NA 132* 134*  K 4.0 3.3*  CL 93* 96  CO2 31 32  GLUCOSE 108* 108*  BUN 8 9  CREATININE 0.72 0.65  CALCIUM 8.9 8.9    PT/INR: No results found for this basename: LABPROT, INR,  in the last 72 hours ABG:  INR: Will add last result for INR, ABG once components are confirmed Will add last 4 CBG results once components are confirmed  Assessment/Plan:  1. CV - SR 2.  Pulmonary - Chest tube with 20 cc of output last 24 hours. There is no air leak. CXR this am appears to show no pneumothorax, post op changes on the right, and left lung mostly clear. Possibly remove remaining chest tube. Patient has  Xopenex ordered tid. 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 Due to primary size of over 5 cm may need at adjunctive chemotherapy

## 2012-09-19 NOTE — Progress Notes (Signed)
Wasted 14cc of fentanyl pca in sink. Witnessed by dawn fields,rn.

## 2012-09-20 ENCOUNTER — Inpatient Hospital Stay (HOSPITAL_COMMUNITY): Payer: Medicare Other

## 2012-09-20 MED ORDER — CEPHALEXIN 500 MG PO CAPS: 500.0000 mg | ORAL_CAPSULE | Freq: Three times a day (TID) | ORAL | Status: DC

## 2012-09-20 MED ORDER — OXYCODONE-ACETAMINOPHEN 5-325 MG PO TABS: 1.0000 | ORAL_TABLET | ORAL | Status: DC | PRN

## 2012-09-20 NOTE — Discharge Summary (Signed)
[address]            [place] [address]          [phone]         Discharge Summary  Name: [name] DOB: August 07, 1940 72 y.o. MRN: [id number]   Admission Date: 09/14/2012 Discharge Date: 09/20/2012    Admitting Diagnosis:  Right upper lobe mass   Discharge Diagnosis:   Invasive, moderately differentiated squamous cell carcinoma, right upper lobe (T2, N0, M0)  Expected postoperative blood loss anemia  Postoperative tracheobronchitis  Past Medical History  Diagnosis Date  . Arthritis   . Wheezing   . Sore throat   . Carotid artery occlusion   . COPD (chronic obstructive pulmonary disease)   . Lumbar disc disease   . Restless leg syndrome   . Allergic rhinitis   . Hypertension   . Anxiety     anxiety  . Shortness of breath   . Lung mass   . Pneumonia     ? now  . GERD (gastroesophageal reflux disease)   . H/O hiatal hernia   . Cancer     lung mass  . Pre-operative cardiovascular examination   . Nonspecific abnormal unspecified cardiovascular function study   . Peripheral vascular disease, unspecified      Procedures: RIGHT VIDEO ASSISTED THORACOSCOPY/MINI-THORACOTOMY, RIGHT UPPER LOBECTOMY, LYMPH NODE DISSECTION on 09/14/2012   HPI:  The patient is a 72 y.o. male who initially presented in January 2014 after falling and injuring his right ribs.  At that time, a chest x-ray was performed, which revealed a right upper lobe lung mass. A followup CT scan showed this to be a 3.0 cm spiculated mass with some postobstructive pneumonitis. The patient had a PET scan which showed this to be intensely hypermetabolic without evidence of active mediastinal involvement or distant metastases. Bronchoscopy was performed by his Dr. Shelle Iron and a biopsy was consistent with adenocarcinoma lung. Poor he function test shows FEV1 to be 1.9 or 683% of predicted DLCO of 86%. The patient has shortness of breath with exertion and still  actively smokes between 1 and 3 packs of cigarettes a day.  He was referred to Dr. Donata Clay for consideration of surgical resection.  A PET scan and an MRI were performed which showed no evidence of metastatic disease.  It was felt that he would benefit from right upper lobectomy.  All risks, benefits and alternatives of surgery were explained in detail, and the patient agreed to proceed.    Hospital Course:  The patient was admitted to [facility] on 09/14/2012. The patient was taken to the operating room and underwent the above procedure.    The postoperative course has generally been uneventful.  He was started on antibiotics for a presumed tracheobronchitis.  His chest tubes have been removed in the standard fashion.  Followup chest x-rays have remained stable.  He has been treated with aggressive pulmonary toilet measures and has been weaned from supplemental oxygen.  He is ambulating in the halls and tolerating a regular diet. Pathology revealed squamous cell carcinoma (T2, N0, M0), which may require adjuvant chemotherapy based on the size (>5 cm).  He will be referred to oncology as an outpatient.  He has been seen on today's date and is deemed ready for discharge home.     Recent vital signs:  Filed Vitals:   09/20/12 0804  BP: 152/68  Pulse:   Temp:   Resp:     Recent laboratory studies:  CBC: Recent Labs  09/19/12 0356  WBC 6.9  HGB 9.8*  HCT 29.2*  PLT 355   BMET:  Recent Labs  09/19/12 0356  NA 134*  K 3.3*  CL 96  CO2 32  GLUCOSE 108*  BUN 9  CREATININE 0.65  CALCIUM 8.9    PT/INR: No results found for this basename: LABPROT, INR,  in the last 72 hours   Discharge Medications:     Medication List    STOP taking these medications       HYDROcodone-acetaminophen 10-325 MG per tablet  Commonly known as:  NORCO      TAKE these medications       albuterol 108 (90 BASE) MCG/ACT inhaler  Commonly known as:  PROAIR HFA  Inhale 2 puffs into the lungs  every 6 (six) hours as needed for shortness of breath.     aspirin EC 81 MG tablet  Take 81 mg by mouth daily.     cephALEXin 500 MG capsule  Commonly known as:  KEFLEX  Take 1 capsule (500 mg total) by mouth 3 (three) times daily. X 1 week     clonazePAM 0.5 MG tablet  Commonly known as:  KLONOPIN  Take 0.5 mg by mouth 2 (two) times daily as needed for anxiety.     guaiFENesin 600 MG 12 hr tablet  Commonly known as:  MUCINEX  Take 2 tablets (1,200 mg total) by mouth 2 (two) times daily. For cough     isosorbide mononitrate 30 MG 24 hr tablet  Commonly known as:  IMDUR  Take 30 mg by mouth daily.     omeprazole 40 MG capsule  Commonly known as:  PRILOSEC  Take 40 mg by mouth daily.     oxyCODONE-acetaminophen 5-325 MG per tablet  Commonly known as:  PERCOCET/ROXICET  Take 1-2 tablets by mouth every 4 (four) hours as needed for pain.     pramipexole 0.5 MG tablet  Commonly known as:  MIRAPEX  Take 1-1.5 mg by mouth at bedtime as needed. Restless legs        Discharge Instructions:  The patient is to refrain from driving, heavy lifting or strenuous activity.  May shower daily and clean incisions with soap and water.  May resume regular diet.   Follow Up:       Future Appointments Mal Asher Department Dept Phone   09/27/2012 10:30 AM [facility] Nurse [facility] [place] [phone]   10/11/2012 10:30 AM Kerin Perna, MD [facility] [phone]   10/18/2012 3:00 PM [facility] [phone]   10/18/2012 4:20 PM Evern Bio, NP [facility] [phone]      Follow-up Information   Follow up with VAN Dinah Beers, MD On 10/11/2012. (Have a chest x-ray at [facility] (first floor) at 9:30, then see MD at 10:30)    Contact information:   [address] [place] [address] [phone]        Follow up with [facility] [place] NURSE On 09/27/2012. (Dr. Zenaida Niece Trigt's nurse will remove your sutures at 10:30)        COLLINS,GINA H 09/20/2012, 10:11 AM

## 2012-09-20 NOTE — Progress Notes (Signed)
                    [address]            [place], [address]          [phone]     6 Days Post-Op Procedure(s) (LRB): LOBECTOMY (Right) VIDEO ASSISTED THORACOSCOPY (Right)  Subjective: Feels well, no complaints.  Wants to go home.   Objective: Vital signs in last 24 hours: Patient Vitals for the past 24 hrs:  BP Temp Temp src Pulse Resp SpO2  09/20/12 0839 - - - - - 97 %  09/20/12 0804 152/68 mmHg - - - - -  09/20/12 0700 - 98.4 F (36.9 C) Oral - - -  09/20/12 0600 - - - 92 21 98 %  09/20/12 0400 160/72 mmHg 98.4 F (36.9 C) Oral 97 26 92 %  09/20/12 0000 - - - 85 - 95 %  09/19/12 2300 - - - 88 20 97 %  09/19/12 2143 - - - 91 26 95 %  09/19/12 1956 125/63 mmHg 98.1 F (36.7 C) Oral 91 20 96 %  09/19/12 1900 134/64 mmHg - - 94 20 -  09/19/12 1700 - - - - - 97 %  09/19/12 1520 - 97.9 F (36.6 C) Oral - 27 97 %  09/19/12 1425 - - - - - 97 %  09/19/12 1200 117/63 mmHg 97.6 F (36.4 C) Oral 86 20 94 %  09/19/12 1103 - - - - 15 94 %  09/19/12 0957 - - - - - 99 %   Current Weight  09/18/12 179 lb 7.3 oz (81.4 kg)     Intake/Output from previous day: 03/11 0701 - 03/12 0700 In: 330 [I.V.:180; IV Piggyback:150] Out: 30 [Chest Tube:30]    PHYSICAL EXAM:  Heart: RRR Lungs: Clear Wound: Clean and dry     Lab Results: CBC: Recent Labs  09/19/12 0356  WBC 6.9  HGB 9.8*  HCT 29.2*  PLT 355   BMET:  Recent Labs  09/19/12 0356  NA 134*  K 3.3*  CL 96  CO2 32  GLUCOSE 108*  BUN 9  CREATININE 0.65  CALCIUM 8.9    PT/INR: No results found for this basename: LABPROT, INR,  in the last 72 hours    CXR: Findings: Right IJ catheter and right chest tube have been removed. No pneumothorax identified. Volume loss in the right chest consistent with postoperative change again identified. Small amount of pleural fluid or pleural thickening on the right chest wall is seen. Fullness of the right hilum is unchanged. Left lung is clear.  Small left effusion is noted. Right sixth rib fracture is again seen.  IMPRESSION:  Status post right chest tube and right IJ catheter removal. No  pneumothorax or other new abnormality.    Assessment/Plan: S/P Procedure(s) (LRB): LOBECTOMY (Right) VIDEO ASSISTED THORACOSCOPY (Right) Still on 2L O2.  Will wean off and check sats to see if he needs home O2. Plan home later today if remains stable.   LOS: 6 days    COLLINS,GINA H 09/20/2012

## 2012-09-20 NOTE — Progress Notes (Signed)
Ambulated patient on RA, o2 sats remained 92-97 % during ambulation. Will discharge per MD order. PIV removed, discharge instructions given- forms signed and copies given with prescriptions.

## 2012-09-22 ENCOUNTER — Telehealth: Payer: Self-pay | Admitting: *Deleted

## 2012-09-22 DIAGNOSIS — R600 Localized edema: Secondary | ICD-10-CM

## 2012-09-22 MED ORDER — POTASSIUM CHLORIDE ER 10 MEQ PO TBCR: 20.0000 meq | EXTENDED_RELEASE_TABLET | Freq: Every day | ORAL | Status: DC

## 2012-09-22 MED ORDER — FUROSEMIDE 40 MG PO TABS: 40.0000 mg | ORAL_TABLET | Freq: Every day | ORAL | Status: DC

## 2012-09-22 NOTE — Discharge Summary (Signed)
patient examined and medical record reviewed,agree with above note. VAN TRIGT III,PETER 09/22/2012    

## 2012-09-22 NOTE — Telephone Encounter (Signed)
[name] wife calls with concerns of bilateral swollen feet and ankles since discharge on 09/20/12 after lobectomy.  She is encouraging elevation but he is not always compliant.  I consulted with Dr. Maren Beach and he gave orders for Lasix and Potassium x 7 days.  His wife was informed.

## 2012-09-25 ENCOUNTER — Other Ambulatory Visit: Payer: Self-pay | Admitting: *Deleted

## 2012-09-25 MED ORDER — ALBUTEROL SULFATE HFA 108 (90 BASE) MCG/ACT IN AERS: 2.0000 | INHALATION_SPRAY | Freq: Four times a day (QID) | RESPIRATORY_TRACT | Status: DC | PRN

## 2012-09-26 ENCOUNTER — Other Ambulatory Visit: Payer: Self-pay | Admitting: *Deleted

## 2012-09-26 DIAGNOSIS — R918 Other nonspecific abnormal finding of lung field: Secondary | ICD-10-CM

## 2012-09-27 ENCOUNTER — Ambulatory Visit (INDEPENDENT_AMBULATORY_CARE_PROVIDER_SITE_OTHER): Payer: Self-pay | Admitting: *Deleted

## 2012-09-27 ENCOUNTER — Other Ambulatory Visit: Payer: Self-pay | Admitting: *Deleted

## 2012-09-27 DIAGNOSIS — Z09 Encounter for follow-up examination after completed treatment for conditions other than malignant neoplasm: Secondary | ICD-10-CM

## 2012-09-27 DIAGNOSIS — Z4802 Encounter for removal of sutures: Secondary | ICD-10-CM

## 2012-09-27 DIAGNOSIS — G8918 Other acute postprocedural pain: Secondary | ICD-10-CM

## 2012-09-27 DIAGNOSIS — D381 Neoplasm of uncertain behavior of trachea, bronchus and lung: Secondary | ICD-10-CM

## 2012-09-27 DIAGNOSIS — R600 Localized edema: Secondary | ICD-10-CM

## 2012-09-27 MED ORDER — FUROSEMIDE 40 MG PO TABS: 40.0000 mg | ORAL_TABLET | Freq: Every day | ORAL | Status: DC

## 2012-09-27 MED ORDER — HYDROCODONE-ACETAMINOPHEN 7.5-325 MG PO TABS: 1.0000 | ORAL_TABLET | ORAL | Status: DC | PRN

## 2012-09-27 MED ORDER — POTASSIUM CHLORIDE ER 10 MEQ PO TBCR: 20.0000 meq | EXTENDED_RELEASE_TABLET | Freq: Every day | ORAL | Status: DC

## 2012-09-27 NOTE — Progress Notes (Signed)
Mr. Guillet returns for suture removal of two sutures from previous chest tube sites, s/p R VATS, RULobectomy.  The mini-thoracotomy incision, as well as the chest tube sites, are very well healed.  He is progressing well.  His only complaint is his continued lower extremity swelling, particularly ankles and feet. His Lasix and Potassium will be continued.  He is requiring pain meds and Hydrocodone 7.5/325 will be prescribed.  He will return as scheduled with a chest xray.

## 2012-10-09 ENCOUNTER — Telehealth: Payer: Self-pay | Admitting: Vascular Surgery

## 2012-10-09 NOTE — Telephone Encounter (Signed)
Spoke with patient regarding appointment on 10/18/2012, he wishes to cancel the appointment and will call us back to reschedule.  He is s/p "lung surgery" for maligant lung mass.   He will need to have a carotid ultrasound and see Dr. [name].

## 2012-10-11 ENCOUNTER — Ambulatory Visit
Admission: RE | Admit: 2012-10-11 | Discharge: 2012-10-11 | Disposition: A | Discharge status: Home / Self Care | Payer: Medicare Other | Source: Ambulatory Visit | Attending: Cardiothoracic Surgery | Admitting: Cardiothoracic Surgery

## 2012-10-11 ENCOUNTER — Ambulatory Visit (INDEPENDENT_AMBULATORY_CARE_PROVIDER_SITE_OTHER): Payer: Self-pay | Admitting: Cardiothoracic Surgery

## 2012-10-11 ENCOUNTER — Encounter: Payer: Self-pay | Admitting: Cardiothoracic Surgery

## 2012-10-11 DIAGNOSIS — C3410 Malignant neoplasm of upper lobe, unspecified bronchus or lung: Secondary | ICD-10-CM

## 2012-10-11 DIAGNOSIS — Z09 Encounter for follow-up examination after completed treatment for conditions other than malignant neoplasm: Secondary | ICD-10-CM

## 2012-10-11 DIAGNOSIS — R918 Other nonspecific abnormal finding of lung field: Secondary | ICD-10-CM

## 2012-10-11 NOTE — Progress Notes (Signed)
PCP is FRIED, [name], MD Referring Provider is Clance, Maree Krabbe, MD  Chief Complaint  Patient presents with  . Routine Post Op    3 wk f/u with cxr...s/p VATS/RUL...09/14/12    HPI: Status post right upper lobectomy March 2014 stage IIb squamous cell carcinoma. He is done well clinically. He will be referred to Dr. Arbutus Ped for evaluation of a at adjunctive chemotherapy. Unfortunately he has smoked a half a pack of cigarettes in the past 5 days He has postthoracotomy pain. He is mildly short of breath with exertion but improving. He has insomnia.  Past Medical History  Diagnosis Date  . Arthritis   . Wheezing   . Sore throat   . Carotid artery occlusion   . COPD (chronic obstructive pulmonary disease)   . Lumbar disc disease   . Restless leg syndrome   . Allergic rhinitis   . Hypertension   . Anxiety     anxiety  . Shortness of breath   . Lung mass   . Pneumonia     ? now  . GERD (gastroesophageal reflux disease)   . H/O hiatal hernia   . Cancer     lung mass  . Pre-operative cardiovascular examination   . Nonspecific abnormal unspecified cardiovascular function study   . Peripheral vascular disease, unspecified     Past Surgical History  Procedure Laterality Date  . Carotid endarterectomy  10/15/2010    left  . Hand surgery      repair of the left long, ring, and small finger after a saw injury  . Video bronchoscopy  07/24/2012    Procedure: VIDEO BRONCHOSCOPY WITH FLUORO;  Surgeon: Barbaraann Share, MD;  Location: [facility];  Service: Cardiopulmonary;  Laterality: Bilateral;  . Lobectomy Right 09/14/2012    Procedure: LOBECTOMY;  Surgeon: Kerin Perna, MD;  Location: [facility] OR;  Service: Thoracic;  Laterality: Right;  RIGHT UPPER LOBECTOMY  . Video assisted thoracoscopy Right 09/14/2012    Procedure: VIDEO ASSISTED THORACOSCOPY;  Surgeon: Kerin Perna, MD;  Location: [facility] OR;  Service: Thoracic;  Laterality: Right;    Family History  Problem Relation Age of Onset  .  Heart disease Father     MI  . Alcohol abuse Mother   . Stroke Brother   . Heart disease Brother     Social History History  Substance Use Topics  . Smoking status: Current Every Day Smoker -- 1.00 packs/day for 55 years    Types: Cigarettes  . Smokeless tobacco: Former Neurosurgeon    Quit date: 04/19/1952     Comment: CURRENTLY SMOKING 1/2 PPD  . Alcohol Use: 4.2 oz/week    7 Cans of beer per week    Current Outpatient Prescriptions  Medication Sig Dispense Refill  . albuterol (PROAIR HFA) 108 (90 BASE) MCG/ACT inhaler Inhale 2 puffs into the lungs every 6 (six) hours as needed for shortness of breath.  1 Inhaler  0  . aspirin EC 81 MG tablet Take 81 mg by mouth daily.      . clonazePAM (KLONOPIN) 0.5 MG tablet Take 0.5 mg by mouth 2 (two) times daily as needed for anxiety.      Marland Kitchen HYDROcodone-acetaminophen (NORCO) 7.5-325 MG per tablet Take 1 tablet by mouth every 4 (four) hours as needed for pain.  40 tablet  0  . isosorbide mononitrate (IMDUR) 30 MG 24 hr tablet Take 30 mg by mouth daily.      Marland Kitchen omeprazole (PRILOSEC) 40 MG capsule  Take 40 mg by mouth daily.      . pramipexole (MIRAPEX) 0.5 MG tablet Take 1-1.5 mg by mouth at bedtime as needed. Restless legs       No current facility-administered medications for this visit.    No Known Allergies  Review of Systems no fever or drainage from the thoracotomy incision  BP 150/82  Pulse 72  Temp(Src) 98.1 F (36.7 C) (Oral)  Resp 20  Ht 5\' 9"  (1.753 m)  Wt 175 lb (79.379 kg)  BMI 25.83 kg/m2  SpO2 96% Physical Exam  Alert and comfortable Lungs clear Right thoracotomy incision healed Heart rate regular Neuro intact   Diagnostic Tests: Chest x-ray clear no infiltrates or or effusion following surgery  Impression: Status post right upper lobectomy with lymph node dissection. Since he is stage IIB he will be referred for adjunctive chemotherapy Plan: Return for 3 month followup chest x-ray. He may drive and perform  normal activity

## 2012-10-18 ENCOUNTER — Other Ambulatory Visit: Payer: Medicare Other

## 2012-10-18 ENCOUNTER — Ambulatory Visit: Payer: Medicare Other | Admitting: Neurosurgery

## 2012-10-26 ENCOUNTER — Encounter: Payer: Self-pay | Admitting: Internal Medicine

## 2012-10-26 ENCOUNTER — Ambulatory Visit (HOSPITAL_BASED_OUTPATIENT_CLINIC_OR_DEPARTMENT_OTHER): Payer: Medicare Other | Admitting: Internal Medicine

## 2012-10-26 ENCOUNTER — Encounter: Payer: Self-pay | Admitting: *Deleted

## 2012-10-26 ENCOUNTER — Ambulatory Visit: Payer: Medicare Other | Admitting: Radiation Oncology

## 2012-10-26 DIAGNOSIS — G8918 Other acute postprocedural pain: Secondary | ICD-10-CM

## 2012-10-26 DIAGNOSIS — C3410 Malignant neoplasm of upper lobe, unspecified bronchus or lung: Secondary | ICD-10-CM

## 2012-10-26 DIAGNOSIS — D491 Neoplasm of unspecified behavior of respiratory system: Secondary | ICD-10-CM | POA: Insufficient documentation

## 2012-10-26 MED ORDER — HYDROCODONE-ACETAMINOPHEN 7.5-325 MG PO TABS: 1.0000 | ORAL_TABLET | ORAL | Status: DC | PRN

## 2012-10-26 MED ORDER — PROCHLORPERAZINE MALEATE 10 MG PO TABS: 10.0000 mg | ORAL_TABLET | Freq: Four times a day (QID) | ORAL | Status: DC | PRN

## 2012-10-26 NOTE — Progress Notes (Signed)
   Thoracic Treatment Summary Name:[name] Date: 10/26/12 DOB: August 21, 2040 Your Medical Team Medical Oncologist: Dr. Arbutus Ped Radiation Oncologist: Pulmonologist: Surgeon: Type and Stage of Lung Cancer Non-Small Cell Carcinoma: Adenocarcinoma  Clinical Stage: Stage IB Pathological Stage:  Clinical stage is based on radiology exams.  Pathological stage will be determined after surgery.  Staging is based on the size of the tumor, involvement of lymph nodes or not, and whether or not the cancer center has spread. Recommendations Recommendations: 4 rounds chemotherapy, Chemo class  These recommendations are based on information available as of today's consult.  This is subject to change depending further testing or exams. Next Steps Next Step: 1.  Cancer will call with appt for chemo class and chemotherapy  Barriers to Care What do you perceive as a potential barrier that may prevent you from receiving your treatment plan? Pt states none at this time Questions Willette Pa, RN BSN Thoracic Oncology Nurse Navigator at [phone]  [name] is a nurse navigator that is available to assist you through your cancer journey.  She can answer your questions and/or provide resources regarding your treatment plan, emotional support, or financial concerns.

## 2012-10-26 NOTE — Progress Notes (Signed)
[facility] [phone]   [phone]  CONSULT NOTE  REFERRING PHYSICIAN: Dr. Kathlee Nations Trigt.  REASON FOR CONSULTATION:  72 years old white male recently diagnosed with lung cancer.  HPI [name] is a 72 y.o. male with past medical history significant for COPD, hypertension, GERD, peripheral vascular disease, anxiety and long history of smoking. The patient mentions that on 07/10/2012 he fell down and fracture a rib. Chest x-ray performed at that time showed right upper lobe lung mass. This was followed by CT scan of the chest on 07/10/2012 and it showed Rounded mass noted in the posterior right upper lobe concerning for malignancy. This measures 3.3 x 3.2 cm. There are peripheral ground-glass and nodular opacities beyond this mass, likely postobstructive atelectasis and/or pneumonitis. A PET scan was performed on 07/19/2012 and it showed markedly hypermetabolic central right upper lobe mass with interval increase and confluent opacity peripheral to the dominant central mass lesion. There was no definite hypermetabolic nodal disease in the central chest/mediastinum. MRI of the brain on 08/04/2012 showed no acute or metastatic intracranial abnormality. On 09/14/2012 the patient underwent a right VATS with right upper lobectomy under the care of Dr. Donata Clay. The final pathology (Accession #: [id number]) showed invasive moderately differentiated squamous cell carcinoma, spanning 5.6 CM in greatest dimension and the tumor involves the visceral pleural surface as well as lymphovascular invasion identified. The dissected lymph nodes showed no evidence for metastatic disease.  The patient is recovering well from his surgery with no specific complaints except for mild cough productive of whitish sputum. He denied having any significant shortness of breath but continues to have soreness on the right side of the chest at the surgical scar. He has no significant  weight loss or night sweats. He denied having any headache or blurry vision. The patient is married and has 1 daughter. He was accompanied today by his wife maybe and his daughter [name]. 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 He has a history of smoking one half pack per day for around 58 years and unfortunately he continues to smoke. I strongly encouraged him to quit smoking and offered him to smoke cessation program. He has no history of alcohol or drug abuse.  @SFHPI @  Past Medical History  Diagnosis Date  . Arthritis   . Wheezing   . Sore throat   . Carotid artery occlusion   . COPD (chronic obstructive pulmonary disease)   . Lumbar disc disease   . Restless leg syndrome   . Allergic rhinitis   . Hypertension   . Anxiety     anxiety  . Shortness of breath   . Lung mass   . Pneumonia     ? now  . GERD (gastroesophageal reflux disease)   . H/O hiatal hernia   . Cancer     lung mass  . Pre-operative cardiovascular examination   . Nonspecific abnormal unspecified cardiovascular function study   . Peripheral vascular disease, unspecified     Past Surgical History  Procedure Laterality Date  . Carotid endarterectomy  10/15/2010    left  . Hand surgery      repair of the left long, ring, and small finger after a saw injury  . Video bronchoscopy  07/24/2012    Procedure: VIDEO BRONCHOSCOPY WITH FLUORO;  Surgeon: Barbaraann Share, MD;  Location: [facility];  Service: Cardiopulmonary;  Laterality: Bilateral;  . Lobectomy Right 09/14/2012  Procedure: LOBECTOMY;  Surgeon: Kerin Perna, MD;  Location: [facility] OR;  Service: Thoracic;  Laterality: Right;  RIGHT UPPER LOBECTOMY  . Video assisted thoracoscopy Right 09/14/2012    Procedure: VIDEO ASSISTED THORACOSCOPY;  Surgeon: Kerin Perna, MD;  Location: [facility] OR;  Service: Thoracic;  Laterality: Right;    Family History  Problem Relation Age of Onset  . Heart disease Father     MI  . Alcohol abuse Mother   . Stroke  Brother   . Heart disease Brother     Social History History  Substance Use Topics  . Smoking status: Current Every Day Smoker -- 1.00 packs/day for 55 years    Types: Cigarettes  . Smokeless tobacco: Former Neurosurgeon    Quit date: 04/19/1952     Comment: CURRENTLY SMOKING 1/2 PPD  . Alcohol Use: 4.2 oz/week    7 Cans of beer per week    No Known Allergies  Current Outpatient Prescriptions  Medication Sig Dispense Refill  . albuterol (PROAIR HFA) 108 (90 BASE) MCG/ACT inhaler Inhale 2 puffs into the lungs every 6 (six) hours as needed for shortness of breath.  1 Inhaler  0  . aspirin EC 81 MG tablet Take 81 mg by mouth daily.      . clonazePAM (KLONOPIN) 0.5 MG tablet Take 0.5 mg by mouth 2 (two) times daily as needed for anxiety.      Marland Kitchen HYDROcodone-acetaminophen (NORCO) 7.5-325 MG per tablet Take 1 tablet by mouth every 4 (four) hours as needed for pain.  40 tablet  0  . isosorbide mononitrate (IMDUR) 30 MG 24 hr tablet Take 30 mg by mouth daily.      Marland Kitchen omeprazole (PRILOSEC) 40 MG capsule Take 40 mg by mouth daily.      . pramipexole (MIRAPEX) 0.5 MG tablet Take 1-1.5 mg by mouth at bedtime as needed. Restless legs      . prochlorperazine (COMPAZINE) 10 MG tablet Take 1 tablet (10 mg total) by mouth every 6 (six) hours as needed.  60 tablet  0   No current facility-administered medications for this visit.    Review of Systems  A comprehensive review of systems was negative except for: Constitutional: positive for fatigue Respiratory: positive for cough, dyspnea on exertion and pleurisy/chest pain  Physical Exam  JYN:WGNFA, healthy, no distress, well nourished and well developed SKIN: skin color, texture, turgor are normal HEAD: Normocephalic, No masses, lesions, tenderness or abnormalities EYES: normal EARS: External ears normal OROPHARYNX:no exudate and no erythema  NECK: supple, no adenopathy LYMPH:  no palpable lymphadenopathy, no hepatosplenomegaly LUNGS: clear to  auscultation  HEART: regular rate & rhythm and no murmurs ABDOMEN:abdomen soft and non-tender BACK: Back symmetric, no curvature. EXTREMITIES:no joint deformities, effusion, or inflammation, no edema, no skin discoloration  NEURO: alert & oriented x 3 with fluent speech, no focal motor/sensory deficits  PERFORMANCE STATUS: ECOG 1  LABORATORY DATA: Lab Results  Component Value Date   WBC 6.9 09/19/2012   HGB 9.8* 09/19/2012   HCT 29.2* 09/19/2012   MCV 90.7 09/19/2012   PLT 355 09/19/2012      Chemistry      Component Value Date/Time   NA 134* 09/19/2012 0356   K 3.3* 09/19/2012 0356   CL 96 09/19/2012 0356   CO2 32 09/19/2012 0356   BUN 9 09/19/2012 0356   CREATININE 0.65 09/19/2012 0356      Component Value Date/Time   CALCIUM 8.9 09/19/2012 0356   ALKPHOS 80 09/16/2012  0420   AST 35 09/16/2012 0420   ALT 19 09/16/2012 0420   BILITOT 0.4 09/16/2012 0420       RADIOGRAPHIC STUDIES: Dg Chest 2 View  10/11/2012  *RADIOLOGY REPORT*  Clinical Data: History of right VATS with right upper lobectomy for resection of adenocarcinoma, follow-up  CHEST - 2 VIEW  Comparison: Chest x-ray of 09/20/2012  Findings: The previously noted anteriorly loculated right hydropneumothorax has resolved.  Volume loss persists on the right after right upper lobectomy.  The left lung is clear.  No evidence of recurrence of lung carcinoma is seen.  Heart size is stable.  No bony abnormality is noted.  IMPRESSION: Resolution of anteriorly loculated right hydropneumothorax. Postoperative changes remain on the right with volume loss.   Original Report Authenticated By: Dwyane Dee, M.D.     ASSESSMENT: this is a very pleasant 72 years old white male recently diagnosed with a stage II (T2b, N0, M0) non-small cell lung cancer, squamous cell carcinoma status post right upper lobectomy.   PLAN: I have a lengthy discussion with the patient and his family today about his disease stage, prognosis and treatment options. I gave the  patient the option of adjuvant chemotherapy versus observation. I indicated to him that the five-year survival for adjuvant chemotherapy range from 5-10% with 4 cycles of platinum-based therapy. The patient would like to consider the systemic chemotherapy and I recommended for him regimen consisting of cisplatin 75 mg/M2 and docetaxel 75 mg/M2 every 3 weeks with Neulasta support. I discussed with the patient adverse effect of the chemotherapy including but not limited to alopecia, myelosuppression, nausea vomiting, peripheral neuropathy, liver or renal dysfunction as well as hearing deficit. He is expected to start the first cycle of his treatment next week. The patient would have a chemotherapy education class before starting the first cycle of his chemotherapy. He would come back for follow up visit in 2 weeks for reevaluation and management any adverse effect of his chemotherapy. I will call his pharmacy with prescription for Compazine 10 mg by mouth every 6 hours as needed for nausea. He was advised to call me immediately if he has any concerning symptoms in the interval. I gave the patient and his family the time to ask questions and I answered them completely to their satisfaction. I strongly encouraged the patient to quit smoking and offered him smoke cessation program.  All questions were answered. The patient knows to call the clinic with any problems, questions or concerns. We can certainly see the patient much sooner if necessary.  Thank you so much for allowing me to participate in the care of [name]. I will continue to follow up the patient with you and assist in his care.  I spent 40 minutes counseling the patient face to face. The total time spent in the appointment was 60 minutes.  [name] K. 10/26/2012, 11:01 PM

## 2012-10-26 NOTE — Patient Instructions (Signed)
You are recently diagnosed with a stage II a non-small cell lung cancer. We discussed adjuvant chemotherapy with cisplatin and docetaxel, first dose next week. Follow up visit in 2 weeks

## 2012-10-27 ENCOUNTER — Telehealth: Payer: Self-pay | Admitting: *Deleted

## 2012-10-27 NOTE — Telephone Encounter (Signed)
Per staff message and POF I have scheduled appts.  JMW  

## 2012-10-30 ENCOUNTER — Telehealth: Payer: Self-pay | Admitting: Internal Medicine

## 2012-10-30 NOTE — Telephone Encounter (Signed)
s.w. pt and advise don 5.1.14 time change...pt ok and aware

## 2012-10-31 ENCOUNTER — Encounter: Payer: Self-pay | Admitting: *Deleted

## 2012-10-31 ENCOUNTER — Other Ambulatory Visit: Payer: Medicare Other

## 2012-11-02 ENCOUNTER — Telehealth: Payer: Self-pay

## 2012-11-02 ENCOUNTER — Ambulatory Visit (HOSPITAL_BASED_OUTPATIENT_CLINIC_OR_DEPARTMENT_OTHER): Payer: Medicare Other

## 2012-11-02 ENCOUNTER — Other Ambulatory Visit: Payer: Medicare Other | Admitting: Lab

## 2012-11-02 ENCOUNTER — Other Ambulatory Visit (HOSPITAL_BASED_OUTPATIENT_CLINIC_OR_DEPARTMENT_OTHER): Payer: Medicare Other | Admitting: Lab

## 2012-11-02 DIAGNOSIS — C3410 Malignant neoplasm of upper lobe, unspecified bronchus or lung: Secondary | ICD-10-CM

## 2012-11-02 DIAGNOSIS — Z5111 Encounter for antineoplastic chemotherapy: Secondary | ICD-10-CM

## 2012-11-02 LAB — COMPREHENSIVE METABOLIC PANEL (CC13)
Albumin: 3.5 g/dL (ref 3.5–5.0)
BUN: 8.4 mg/dL (ref 7.0–26.0)
Calcium: 9.1 mg/dL (ref 8.4–10.4)
Chloride: 100 mEq/L (ref 98–107)
Creatinine: 0.8 mg/dL (ref 0.7–1.3)
Glucose: 94 mg/dl (ref 70–99)
Potassium: 4.3 mEq/L (ref 3.5–5.1)

## 2012-11-02 LAB — CBC WITH DIFFERENTIAL/PLATELET
BASO%: 0.7 % (ref 0.0–2.0)
MCHC: 33.3 g/dL (ref 32.0–36.0)
MONO#: 0.7 10*3/uL (ref 0.1–0.9)
RBC: 4.34 10*6/uL (ref 4.20–5.82)
RDW: 15.1 % — ABNORMAL HIGH (ref 11.0–14.6)
WBC: 7.1 10*3/uL (ref 4.0–10.3)
lymph#: 2.4 10*3/uL (ref 0.9–3.3)
nRBC: 0 % (ref 0–0)

## 2012-11-02 LAB — MAGNESIUM (CC13): Magnesium: 2.4 mg/dl (ref 1.5–2.5)

## 2012-11-02 MED ORDER — SODIUM CHLORIDE 0.9 % IV SOLN
75.0000 mg/m2 | Freq: Once | INTRAVENOUS | Status: AC
  Administered 2012-11-02: 140 mg via INTRAVENOUS
  Filled 2012-11-02: qty 14

## 2012-11-02 MED ORDER — DEXAMETHASONE SODIUM PHOSPHATE 4 MG/ML IJ SOLN
12.0000 mg | Freq: Once | INTRAMUSCULAR | Status: AC
  Administered 2012-11-02: 12 mg via INTRAVENOUS

## 2012-11-02 MED ORDER — SODIUM CHLORIDE 0.9 % IV SOLN
150.0000 mg | Freq: Once | INTRAVENOUS | Status: AC
  Administered 2012-11-02: 150 mg via INTRAVENOUS
  Filled 2012-11-02: qty 5

## 2012-11-02 MED ORDER — POTASSIUM CHLORIDE 2 MEQ/ML IV SOLN
Freq: Once | INTRAVENOUS | Status: AC
  Administered 2012-11-02: 12:00:00 via INTRAVENOUS
  Filled 2012-11-02: qty 10

## 2012-11-02 MED ORDER — SODIUM CHLORIDE 0.9 % IV SOLN
Freq: Once | INTRAVENOUS | Status: AC
  Administered 2012-11-02: 09:00:00 via INTRAVENOUS

## 2012-11-02 MED ORDER — PALONOSETRON HCL INJECTION 0.25 MG/5ML
0.2500 mg | Freq: Once | INTRAVENOUS | Status: AC
  Administered 2012-11-02: 0.25 mg via INTRAVENOUS

## 2012-11-02 MED ORDER — SODIUM CHLORIDE 0.9 % IV SOLN
75.0000 mg/m2 | Freq: Once | INTRAVENOUS | Status: AC
  Administered 2012-11-02: 145 mg via INTRAVENOUS
  Filled 2012-11-02: qty 145

## 2012-11-02 NOTE — Patient Instructions (Addendum)
Cisplatin injection What is this medicine? CISPLATIN (SIS pla tin) is a chemotherapy drug. It targets fast dividing cells, like cancer cells, and causes these cells to die. This medicine is used to treat many types of cancer like bladder, ovarian, and testicular cancers. This medicine may be used for other purposes; ask your health care provider or pharmacist if you have questions. What should I tell my health care provider before I take this medicine? They need to know if you have any of these conditions: -blood disorders -hearing problems -kidney disease -recent or ongoing radiation therapy -an unusual or allergic reaction to cisplatin, carboplatin, other chemotherapy, other medicines, foods, dyes, or preservatives -pregnant or trying to get pregnant -breast-feeding How should I use this medicine? This drug is given as an infusion into a vein. It is administered in a hospital or clinic by a specially trained health care professional. Talk to your pediatrician regarding the use of this medicine in children. Special care may be needed. Overdosage: If you think you have taken too much of this medicine contact a poison control center or emergency room at once. NOTE: This medicine is only for you. Do not share this medicine with others. What if I miss a dose? It is important not to miss a dose. Call your doctor or health care professional if you are unable to keep an appointment. What may interact with this medicine? -dofetilide -foscarnet -medicines for seizures -medicines to increase blood counts like filgrastim, pegfilgrastim, sargramostim -probenecid -pyridoxine used with altretamine -rituximab -some antibiotics like amikacin, gentamicin, neomycin, polymyxin B, streptomycin, tobramycin -sulfinpyrazone -vaccines -zalcitabine Talk to your doctor or health care professional before taking any of these medicines: -acetaminophen -aspirin -ibuprofen -ketoprofen -naproxen This list may  not describe all possible interactions. Give your health care provider a list of all the medicines, herbs, non-prescription drugs, or dietary supplements you use. Also tell them if you smoke, drink alcohol, or use illegal drugs. Some items may interact with your medicine. What should I watch for while using this medicine? Your condition will be monitored carefully while you are receiving this medicine. You will need important blood work done while you are taking this medicine. This drug may make you feel generally unwell. This is not uncommon, as chemotherapy can affect healthy cells as well as cancer cells. Report any side effects. Continue your course of treatment even though you feel ill unless your doctor tells you to stop. In some cases, you may be given additional medicines to help with side effects. Follow all directions for their use. Call your doctor or health care professional for advice if you get a fever, chills or sore throat, or other symptoms of a cold or flu. Do not treat yourself. This drug decreases your body's ability to fight infections. Try to avoid being around people who are sick. This medicine may increase your risk to bruise or bleed. Call your doctor or health care professional if you notice any unusual bleeding. Be careful brushing and flossing your teeth or using a toothpick because you may get an infection or bleed more easily. If you have any dental work done, tell your dentist you are receiving this medicine. Avoid taking products that contain aspirin, acetaminophen, ibuprofen, naproxen, or ketoprofen unless instructed by your doctor. These medicines may hide a fever. Do not become pregnant while taking this medicine. Women should inform their doctor if they wish to become pregnant or think they might be pregnant. There is a potential for serious side effects to   an unborn child. Talk to your health care professional or pharmacist for more information. Do not breast-feed an  infant while taking this medicine. Drink fluids as directed while you are taking this medicine. This will help protect your kidneys. Call your doctor or health care professional if you get diarrhea. Do not treat yourself. What side effects may I notice from receiving this medicine? Side effects that you should report to your doctor or health care professional as soon as possible: -allergic reactions like skin rash, itching or hives, swelling of the face, lips, or tongue -signs of infection - fever or chills, cough, sore throat, pain or difficulty passing urine -signs of decreased platelets or bleeding - bruising, pinpoint red spots on the skin, black, tarry stools, nosebleeds -signs of decreased red blood cells - unusually weak or tired, fainting spells, lightheadedness -breathing problems -changes in hearing -gout pain -low blood counts - This drug may decrease the number of white blood cells, red blood cells and platelets. You may be at increased risk for infections and bleeding. -nausea and vomiting -pain, swelling, redness or irritation at the injection site -pain, tingling, numbness in the hands or feet -problems with balance, movement -trouble passing urine or change in the amount of urine Side effects that usually do not require medical attention (report to your doctor or health care professional if they continue or are bothersome): -changes in vision -loss of appetite -metallic taste in the mouth or changes in taste This list may not describe all possible side effects. Call your doctor for medical advice about side effects. You may report side effects to FDA at 1-800-FDA-1088. Where should I keep my medicine? This drug is given in a hospital or clinic and will not be stored at home. NOTE: This sheet is a summary. It may not cover all possible information. If you have questions about this medicine, talk to your doctor, pharmacist, or health care provider.  2012, Elsevier/Gold  Standard. (10/03/2007 2:40:54 PM)Docetaxel injection What is this medicine? DOCETAXEL (doe se TAX el) is a chemotherapy drug. It targets fast dividing cells, like cancer cells, and causes these cells to die. This medicine is used to treat many types of cancers like breast cancer, certain stomach cancers, head and neck cancer, lung cancer, and prostate cancer. This medicine may be used for other purposes; ask your health care provider or pharmacist if you have questions. What should I tell my health care provider before I take this medicine? They need to know if you have any of these conditions: -infection (especially a virus infection such as chickenpox, cold sores, or herpes) -liver disease -low blood counts, like low white cell, platelet, or red cell counts -an unusual or allergic reaction to docetaxel, polysorbate 80, other chemotherapy agents, other medicines, foods, dyes, or preservatives -pregnant or trying to get pregnant -breast-feeding How should I use this medicine? This drug is given as an infusion into a vein. It is administered in a hospital or clinic by a specially trained health care professional. Talk to your pediatrician regarding the use of this medicine in children. Special care may be needed. Overdosage: If you think you have taken too much of this medicine contact a poison control center or emergency room at once. NOTE: This medicine is only for you. Do not share this medicine with others. What if I miss a dose? It is important not to miss your dose. Call your doctor or health care professional if you are unable to keep an appointment. What may  interact with this medicine? -cyclosporine -erythromycin -ketoconazole -medicines to increase blood counts like filgrastim, pegfilgrastim, sargramostim -vaccines Talk to your doctor or health care professional before taking any of these medicines: -acetaminophen -aspirin -ibuprofen -ketoprofen -naproxen This list may not  describe all possible interactions. Give your health care provider a list of all the medicines, herbs, non-prescription drugs, or dietary supplements you use. Also tell them if you smoke, drink alcohol, or use illegal drugs. Some items may interact with your medicine. What should I watch for while using this medicine? Your condition will be monitored carefully while you are receiving this medicine. You will need important blood work done while you are taking this medicine. This drug may make you feel generally unwell. This is not uncommon, as chemotherapy can affect healthy cells as well as cancer cells. Report any side effects. Continue your course of treatment even though you feel ill unless your doctor tells you to stop. In some cases, you may be given additional medicines to help with side effects. Follow all directions for their use. Call your doctor or health care professional for advice if you get a fever, chills or sore throat, or other symptoms of a cold or flu. Do not treat yourself. This drug decreases your body's ability to fight infections. Try to avoid being around people who are sick. This medicine may increase your risk to bruise or bleed. Call your doctor or health care professional if you notice any unusual bleeding. Be careful brushing and flossing your teeth or using a toothpick because you may get an infection or bleed more easily. If you have any dental work done, tell your dentist you are receiving this medicine. Avoid taking products that contain aspirin, acetaminophen, ibuprofen, naproxen, or ketoprofen unless instructed by your doctor. These medicines may hide a fever. Do not become pregnant while taking this medicine. Women should inform their doctor if they wish to become pregnant or think they might be pregnant. There is a potential for serious side effects to an unborn child. Talk to your health care professional or pharmacist for more information. Do not breast-feed an infant  while taking this medicine. What side effects may I notice from receiving this medicine? Side effects that you should report to your doctor or health care professional as soon as possible: -allergic reactions like skin rash, itching or hives, swelling of the face, lips, or tongue -low blood counts - This drug may decrease the number of white blood cells, red blood cells and platelets. You may be at increased risk for infections and bleeding. -signs of infection - fever or chills, cough, sore throat, pain or difficulty passing urine -signs of decreased platelets or bleeding - bruising, pinpoint red spots on the skin, black, tarry stools, nosebleeds -signs of decreased red blood cells - unusually weak or tired, fainting spells, lightheadedness -breathing problems -fast or irregular heartbeat -low blood pressure -mouth sores -nausea and vomiting -pain, swelling, redness or irritation at the injection site -pain, tingling, numbness in the hands or feet -swelling of the ankle, feet, hands -weight gain Side effects that usually do not require medical attention (report to your prescriber or health care professional if they continue or are bothersome): -bone pain -complete hair loss including hair on your head, underarms, pubic hair, eyebrows, and eyelashes -diarrhea -excessive tearing -changes in the color of fingernails -loosening of the fingernails -nausea -muscle pain -red flush to skin -sweating -weak or tired This list may not describe all possible side effects. Call your doctor  for medical advice about side effects. You may report side effects to FDA at 1-800-FDA-1088. Where should I keep my medicine? This drug is given in a hospital or clinic and will not be stored at home. NOTE: This sheet is a summary. It may not cover all possible information. If you have questions about this medicine, talk to your doctor, pharmacist, or health care provider.  2013, Elsevier/Gold Standard.  (06/10/2008 11:52:10 AM)

## 2012-11-03 ENCOUNTER — Telehealth: Payer: Self-pay | Admitting: *Deleted

## 2012-11-03 ENCOUNTER — Ambulatory Visit (HOSPITAL_BASED_OUTPATIENT_CLINIC_OR_DEPARTMENT_OTHER): Payer: Medicare Other

## 2012-11-03 DIAGNOSIS — C3410 Malignant neoplasm of upper lobe, unspecified bronchus or lung: Secondary | ICD-10-CM

## 2012-11-03 MED ORDER — PEGFILGRASTIM INJECTION 6 MG/0.6ML
6.0000 mg | Freq: Once | SUBCUTANEOUS | Status: AC
  Administered 2012-11-03: 6 mg via SUBCUTANEOUS
  Filled 2012-11-03: qty 0.6

## 2012-11-03 NOTE — Patient Instructions (Addendum)

## 2012-11-03 NOTE — Telephone Encounter (Signed)
[name] and his wife here for his Neulasta injection following 1st taxot/cisplatin chemotherapy.  States doesn't feel like he had anything.  No nausea, vomiting, or diarrhea.  Is eating and drinking without problems.  All questions answered.  Knows to call the office if he has any problems or concerns.

## 2012-11-08 ENCOUNTER — Other Ambulatory Visit: Payer: Self-pay | Admitting: Internal Medicine

## 2012-11-08 MED ORDER — ONDANSETRON 8 MG PO TBDP: 8.0000 mg | ORAL_TABLET | Freq: Three times a day (TID) | ORAL | Status: DC | PRN

## 2012-11-09 ENCOUNTER — Telehealth: Payer: Self-pay | Admitting: Medical Oncology

## 2012-11-09 ENCOUNTER — Encounter: Payer: Self-pay | Admitting: Internal Medicine

## 2012-11-09 ENCOUNTER — Other Ambulatory Visit (HOSPITAL_BASED_OUTPATIENT_CLINIC_OR_DEPARTMENT_OTHER): Payer: Medicare Other | Admitting: Lab

## 2012-11-09 ENCOUNTER — Ambulatory Visit: Payer: Medicare Other

## 2012-11-09 ENCOUNTER — Other Ambulatory Visit: Payer: Medicare Other

## 2012-11-09 ENCOUNTER — Ambulatory Visit (HOSPITAL_BASED_OUTPATIENT_CLINIC_OR_DEPARTMENT_OTHER): Payer: Medicare Other | Admitting: Internal Medicine

## 2012-11-09 VITALS — BP 157/89 | HR 109 | Temp 99.0°F | Resp 20 | Ht 69.0 in | Wt 166.3 lb

## 2012-11-09 DIAGNOSIS — R112 Nausea with vomiting, unspecified: Secondary | ICD-10-CM

## 2012-11-09 DIAGNOSIS — C3410 Malignant neoplasm of upper lobe, unspecified bronchus or lung: Secondary | ICD-10-CM

## 2012-11-09 DIAGNOSIS — E860 Dehydration: Secondary | ICD-10-CM

## 2012-11-09 DIAGNOSIS — C3490 Malignant neoplasm of unspecified part of unspecified bronchus or lung: Secondary | ICD-10-CM

## 2012-11-09 LAB — COMPREHENSIVE METABOLIC PANEL (CC13)
ALT: 16 U/L (ref 0–55)
AST: 14 U/L (ref 5–34)
Alkaline Phosphatase: 113 U/L (ref 40–150)
BUN: 10.8 mg/dL (ref 7.0–26.0)
Creatinine: 0.8 mg/dL (ref 0.7–1.3)
Total Bilirubin: 0.3 mg/dL (ref 0.20–1.20)

## 2012-11-09 LAB — CBC WITH DIFFERENTIAL/PLATELET
BASO%: 0.1 % (ref 0.0–2.0)
EOS%: 0.1 % (ref 0.0–7.0)
HCT: 43.2 % (ref 38.4–49.9)
LYMPH%: 13 % — ABNORMAL LOW (ref 14.0–49.0)
MCH: 29.2 pg (ref 27.2–33.4)
MCHC: 32.7 g/dL (ref 32.0–36.0)
MCV: 89.3 fL (ref 79.3–98.0)
MONO%: 17.9 % — ABNORMAL HIGH (ref 0.0–14.0)
NEUT%: 68.9 % (ref 39.0–75.0)
Platelets: 265 10*3/uL (ref 140–400)
lymph#: 2.8 10*3/uL (ref 0.9–3.3)

## 2012-11-09 MED ORDER — ONDANSETRON 16 MG/50ML IVPB (CHCC)
16.0000 mg | Freq: Once | INTRAVENOUS | Status: AC
  Administered 2012-11-09: 16 mg via INTRAVENOUS

## 2012-11-09 MED ORDER — SODIUM CHLORIDE 0.9 % IV SOLN
INTRAVENOUS | Status: DC
  Administered 2012-11-09: 12:00:00 via INTRAVENOUS

## 2012-11-09 MED ORDER — DEXAMETHASONE 4 MG PO TABS: ORAL_TABLET | ORAL | Status: DC

## 2012-11-09 NOTE — Patient Instructions (Addendum)
Significant nausea and vomiting after the first cycle of chemotherapy. I will arrange for you to receive IV hydration and anti-emetic today. Followup visit in 2 weeks with the next chemotherapy.

## 2012-11-09 NOTE — Progress Notes (Signed)
[facility] [phone]   [phone]  OFFICE PROGRESS NOTE  [name], MD [address] [place] [address]  DIAGNOSIS: Stage IIA (T2 B., N0, M0) non-small cell lung cancer, squamous cell carcinoma diagnosed in December of 2013  PRIOR THERAPY: Status post right upper lobectomy under the care of Dr. Donata Clay on 09/14/2012.  CURRENT THERAPY: Adjuvant chemotherapy with cisplatin 75 mg/M2 and docetaxel 75 mg/M2 with Neulasta support every 3 weeks, status post 1 cycle  INTERVAL HISTORY: [name] 72 y.o. male returns to the clinic today for followup visit accompanied by his wife and daughter, Amada Jupiter. The patient is status post 1 cycle of the chemotherapy was cisplatin and docetaxel given last week. He had significant nausea and vomiting as well as dehydration and poor appetite since his first treatment. He was Compazine at home with no improvement. I called him with prescription for Zofran ODT last night but his pharmacy did not have the ODT formulation of Zofran. He denied having any significant chest pain, shortness of breath, cough or hemoptysis. He lost few pounds since his last visit. He denied having any significant fever or chills. He is here today for evaluation and management of the adverse effects of the chemotherapy.  MEDICAL HISTORY: Past Medical History  Diagnosis Date  . Arthritis   . Wheezing   . Sore throat   . Carotid artery occlusion   . COPD (chronic obstructive pulmonary disease)   . Lumbar disc disease   . Restless leg syndrome   . Allergic rhinitis   . Hypertension   . Anxiety     anxiety  . Shortness of breath   . Lung mass   . Pneumonia     ? now  . GERD (gastroesophageal reflux disease)   . H/O hiatal hernia   . Cancer     lung mass  . Pre-operative cardiovascular examination   . Nonspecific abnormal unspecified cardiovascular function study   . Peripheral vascular disease, unspecified     ALLERGIES:  has No Known  Allergies.  MEDICATIONS:  Current Outpatient Prescriptions  Medication Sig Dispense Refill  . albuterol (PROAIR HFA) 108 (90 BASE) MCG/ACT inhaler Inhale 2 puffs into the lungs every 6 (six) hours as needed for shortness of breath.  1 Inhaler  0  . aspirin EC 81 MG tablet Take 81 mg by mouth daily.      . clonazePAM (KLONOPIN) 0.5 MG tablet Take 0.5 mg by mouth 2 (two) times daily as needed for anxiety.      Marland Kitchen HYDROcodone-acetaminophen (NORCO) 7.5-325 MG per tablet Take 1 tablet by mouth every 4 (four) hours as needed for pain.  40 tablet  0  . isosorbide mononitrate (IMDUR) 30 MG 24 hr tablet Take 30 mg by mouth daily.      Marland Kitchen loratadine (CLARITIN) 10 MG tablet Take 10 mg by mouth daily.      . magnesium hydroxide (MILK OF MAGNESIA) 400 MG/5ML suspension Take 5 mLs by mouth daily as needed for constipation.      Marland Kitchen omeprazole (PRILOSEC) 40 MG capsule Take 40 mg by mouth daily.      . ondansetron (ZOFRAN ODT) 8 MG disintegrating tablet Take 1 tablet (8 mg total) by mouth every 8 (eight) hours as needed for nausea.  30 tablet  0  . pramipexole (MIRAPEX) 0.5 MG tablet Take 1-1.5 mg by mouth at bedtime as needed. Restless legs      . prochlorperazine (  COMPAZINE) 10 MG tablet Take 1 tablet (10 mg total) by mouth every 6 (six) hours as needed.  60 tablet  0  . pseudoephedrine-guaifenesin (MUCINEX D) 60-600 MG per tablet Take 1 tablet by mouth every 12 (twelve) hours.       No current facility-administered medications for this visit.    SURGICAL HISTORY:  Past Surgical History  Procedure Laterality Date  . Carotid endarterectomy  10/15/2010    left  . Hand surgery      repair of the left long, ring, and small finger after a saw injury  . Video bronchoscopy  07/24/2012    Procedure: VIDEO BRONCHOSCOPY WITH FLUORO;  Surgeon: Barbaraann Share, MD;  Location: [facility];  Service: Cardiopulmonary;  Laterality: Bilateral;  . Lobectomy Right 09/14/2012    Procedure: LOBECTOMY;  Surgeon: Kerin Perna,  MD;  Location: [facility] OR;  Service: Thoracic;  Laterality: Right;  RIGHT UPPER LOBECTOMY  . Video assisted thoracoscopy Right 09/14/2012    Procedure: VIDEO ASSISTED THORACOSCOPY;  Surgeon: Kerin Perna, MD;  Location: [facility] OR;  Service: Thoracic;  Laterality: Right;    REVIEW OF SYSTEMS:  A comprehensive review of systems was negative except for: Constitutional: positive for anorexia, fatigue and weight loss Gastrointestinal: positive for nausea and vomiting   PHYSICAL EXAMINATION: General appearance: alert, cooperative, fatigued and no distress Head: Normocephalic, without obvious abnormality, atraumatic Neck: no adenopathy Lymph nodes: Cervical, supraclavicular, and axillary nodes normal. Resp: clear to auscultation bilaterally Cardio: regular rate and rhythm, S1, S2 normal, no murmur, click, rub or gallop GI: soft, non-tender; bowel sounds normal; no masses,  no organomegaly Extremities: extremities normal, atraumatic, no cyanosis or edema Neurologic: Alert and oriented X 3, normal strength and tone. Normal symmetric reflexes. Normal coordination and gait  ECOG PERFORMANCE STATUS: 1 - Symptomatic but completely ambulatory  Blood pressure 157/89, pulse 109, temperature 99 F (37.2 C), temperature source Oral, resp. rate 20, height 5\' 9"  (1.753 m), weight 166 lb 4.8 oz (75.433 kg).  LABORATORY DATA: Lab Results  Component Value Date   WBC 21.4* 11/09/2012   HGB 14.1 11/09/2012   HCT 43.2 11/09/2012   MCV 89.3 11/09/2012   PLT 265 11/09/2012      Chemistry      Component Value Date/Time   NA 129* 11/09/2012 1030   NA 134* 09/19/2012 0356   K 4.2 11/09/2012 1030   K 3.3* 09/19/2012 0356   CL 93* 11/09/2012 1030   CL 96 09/19/2012 0356   CO2 28 11/09/2012 1030   CO2 32 09/19/2012 0356   BUN 10.8 11/09/2012 1030   BUN 9 09/19/2012 0356   CREATININE 0.8 11/09/2012 1030   CREATININE 0.65 09/19/2012 0356      Component Value Date/Time   CALCIUM 9.0 11/09/2012 1030   CALCIUM 8.9 09/19/2012 0356   ALKPHOS  113 11/09/2012 1030   ALKPHOS 80 09/16/2012 0420   AST 14 11/09/2012 1030   AST 35 09/16/2012 0420   ALT 16 11/09/2012 1030   ALT 19 09/16/2012 0420   BILITOT 0.30 11/09/2012 1030   BILITOT 0.4 09/16/2012 0420       RADIOGRAPHIC STUDIES: Dg Chest 2 View  10/11/2012  *RADIOLOGY REPORT*  Clinical Data: History of right VATS with right upper lobectomy for resection of adenocarcinoma, follow-up  CHEST - 2 VIEW  Comparison: Chest x-ray of 09/20/2012  Findings: The previously noted anteriorly loculated right hydropneumothorax has resolved.  Volume loss persists on the right after right upper lobectomy.  The  left lung is clear.  No evidence of recurrence of lung carcinoma is seen.  Heart size is stable.  No bony abnormality is noted.  IMPRESSION: Resolution of anteriorly loculated right hydropneumothorax. Postoperative changes remain on the right with volume loss.   Original Report Authenticated By: Dwyane Dee, M.D.     ASSESSMENT: This is a very pleasant 72 years old white male with history of stage II a non-small cell lung cancer status post right upper lobectomy and currently undergoing adjuvant chemotherapy with cisplatin and docetaxel status post 1 cycle. His chemotherapy was complicated with significant nausea and vomiting as well as dehydration.   PLAN: I have a lengthy discussion with the patient and his family about his condition. I recommended for him to receive IV fluids with normal saline today in addition to 16 mg of Zofran. Advised the patient to get the Zofran ODT from his pharmacy and take it every 8 hours for nausea. I would also start the patient on Decadron 4 mg by mouth twice a day for 5 days. I may consider changing his chemotherapy to carboplatin and paclitaxel, starting from the next cycle because of his intolerance to the cisplatin and docetaxel. He was advised to call early tomorrow but he still has nausea and vomiting and dehydration so we can arrange for the patient to receive IV  hydration again at the cancer Center before the weekend.  He would come back for followup visit in 2 weeks with the start of cycle #2.  He was advised to call immediately if he has any concerning symptoms in the interval.  All questions were answered. The patient knows to call the clinic with any problems, questions or concerns. We can certainly see the patient much sooner if necessary.  I spent 15 minutes counseling the patient face to face. The total time spent in the appointment was 25 minutes.

## 2012-11-09 NOTE — Telephone Encounter (Signed)
Wife to take ondansetron back to pharmacy to confirm it is ODT

## 2012-11-09 NOTE — Patient Instructions (Signed)
Dehydration, Adult Dehydration is when you lose more fluids from the body than you take in. Vital organs like the kidneys, brain, and heart cannot function without a proper amount of fluids and salt. Any loss of fluids from the body can cause dehydration.  CAUSES   Vomiting.  Diarrhea.  Excessive sweating.  Excessive urine output.  Fever. SYMPTOMS  Mild dehydration  Thirst.  Dry lips.  Slightly dry mouth. Moderate dehydration  Very dry mouth.  Sunken eyes.  Skin does not bounce back quickly when lightly pinched and released.  Dark urine and decreased urine production.  Decreased tear production.  Headache. Severe dehydration  Very dry mouth.  Extreme thirst.  Rapid, weak pulse (more than 100 beats per minute at rest).  Cold hands and feet.  Not able to sweat in spite of heat and temperature.  Rapid breathing.  Blue lips.  Confusion and lethargy.  Difficulty being awakened.  Minimal urine production.  No tears. DIAGNOSIS  Your caregiver will diagnose dehydration based on your symptoms and your exam. Blood and urine tests will help confirm the diagnosis. The diagnostic evaluation should also identify the cause of dehydration. TREATMENT  Treatment of mild or moderate dehydration can often be done at home by increasing the amount of fluids that you drink. It is best to drink small amounts of fluid more often. Drinking too much at one time can make vomiting worse. Refer to the home care instructions below. Severe dehydration needs to be treated at the hospital where you will probably be given intravenous (IV) fluids that contain water and electrolytes. HOME CARE INSTRUCTIONS   Ask your caregiver about specific rehydration instructions.  Drink enough fluids to keep your urine clear or pale yellow.  Drink small amounts frequently if you have nausea and vomiting.  Eat as you normally do.  Avoid:  Foods or drinks high in sugar.  Carbonated  drinks.  Juice.  Extremely hot or cold fluids.  Drinks with caffeine.  Fatty, greasy foods.  Alcohol.  Tobacco.  Overeating.  Gelatin desserts.  Wash your hands well to avoid spreading bacteria and viruses.  Only take over-the-counter or prescription medicines for pain, discomfort, or fever as directed by your caregiver.  Ask your caregiver if you should continue all prescribed and over-the-counter medicines.  Keep all follow-up appointments with your caregiver. SEEK MEDICAL CARE IF:  You have abdominal pain and it increases or stays in one area (localizes).  You have a rash, stiff neck, or severe headache.  You are irritable, sleepy, or difficult to awaken.  You are weak, dizzy, or extremely thirsty. SEEK IMMEDIATE MEDICAL CARE IF:   You are unable to keep fluids down or you get worse despite treatment.  You have frequent episodes of vomiting or diarrhea.  You have blood or green matter (bile) in your vomit.  You have blood in your stool or your stool looks black and tarry.  You have not urinated in 6 to 8 hours, or you have only urinated a small amount of very dark urine.  You have a fever.  You faint. MAKE SURE YOU:   Understand these instructions.  Will watch your condition.  Will get help right away if you are not doing well or get worse. Document Released: 06/28/2005 Document Revised: 09/20/2011 Document Reviewed: 02/15/2011 [employer] Patient Information 2013 [employer].  

## 2012-11-16 ENCOUNTER — Other Ambulatory Visit (HOSPITAL_BASED_OUTPATIENT_CLINIC_OR_DEPARTMENT_OTHER): Payer: Medicare Other | Admitting: Lab

## 2012-11-16 DIAGNOSIS — C3410 Malignant neoplasm of upper lobe, unspecified bronchus or lung: Secondary | ICD-10-CM

## 2012-11-16 LAB — CBC WITH DIFFERENTIAL/PLATELET
BASO%: 0.2 % (ref 0.0–2.0)
EOS%: 0 % (ref 0.0–7.0)
HCT: 35.7 % — ABNORMAL LOW (ref 38.4–49.9)
LYMPH%: 20.8 % (ref 14.0–49.0)
MCH: 29.1 pg (ref 27.2–33.4)
MCHC: 32.6 g/dL (ref 32.0–36.0)
MONO#: 1.7 10*3/uL — ABNORMAL HIGH (ref 0.1–0.9)
NEUT%: 68.2 % (ref 39.0–75.0)
Platelets: 260 10*3/uL (ref 140–400)
RBC: 3.99 10*6/uL — ABNORMAL LOW (ref 4.20–5.82)
WBC: 16.1 10*3/uL — ABNORMAL HIGH (ref 4.0–10.3)

## 2012-11-16 LAB — COMPREHENSIVE METABOLIC PANEL (CC13)
AST: 14 U/L (ref 5–34)
Albumin: 2.8 g/dL — ABNORMAL LOW (ref 3.5–5.0)
BUN: 7.6 mg/dL (ref 7.0–26.0)
Calcium: 8.3 mg/dL — ABNORMAL LOW (ref 8.4–10.4)
Chloride: 96 mEq/L — ABNORMAL LOW (ref 98–107)
Glucose: 89 mg/dl (ref 70–99)
Potassium: 3.8 mEq/L (ref 3.5–5.1)

## 2012-11-23 ENCOUNTER — Encounter: Payer: Self-pay | Admitting: Physician Assistant

## 2012-11-23 ENCOUNTER — Telehealth: Payer: Self-pay | Admitting: *Deleted

## 2012-11-23 ENCOUNTER — Telehealth: Payer: Self-pay | Admitting: Internal Medicine

## 2012-11-23 ENCOUNTER — Other Ambulatory Visit (HOSPITAL_BASED_OUTPATIENT_CLINIC_OR_DEPARTMENT_OTHER): Payer: Medicare Other | Admitting: Lab

## 2012-11-23 ENCOUNTER — Other Ambulatory Visit: Payer: Medicare Other | Admitting: Lab

## 2012-11-23 ENCOUNTER — Other Ambulatory Visit: Payer: Self-pay | Admitting: Medical Oncology

## 2012-11-23 ENCOUNTER — Ambulatory Visit (HOSPITAL_BASED_OUTPATIENT_CLINIC_OR_DEPARTMENT_OTHER): Payer: Medicare Other | Admitting: Physician Assistant

## 2012-11-23 ENCOUNTER — Ambulatory Visit (HOSPITAL_BASED_OUTPATIENT_CLINIC_OR_DEPARTMENT_OTHER): Payer: Medicare Other

## 2012-11-23 DIAGNOSIS — Z5111 Encounter for antineoplastic chemotherapy: Secondary | ICD-10-CM

## 2012-11-23 DIAGNOSIS — C3410 Malignant neoplasm of upper lobe, unspecified bronchus or lung: Secondary | ICD-10-CM

## 2012-11-23 DIAGNOSIS — R079 Chest pain, unspecified: Secondary | ICD-10-CM

## 2012-11-23 DIAGNOSIS — G4700 Insomnia, unspecified: Secondary | ICD-10-CM

## 2012-11-23 DIAGNOSIS — G8918 Other acute postprocedural pain: Secondary | ICD-10-CM

## 2012-11-23 DIAGNOSIS — F17200 Nicotine dependence, unspecified, uncomplicated: Secondary | ICD-10-CM

## 2012-11-23 LAB — COMPREHENSIVE METABOLIC PANEL (CC13)
ALT: 20 U/L (ref 0–55)
AST: 14 U/L (ref 5–34)
Albumin: 3.3 g/dL — ABNORMAL LOW (ref 3.5–5.0)
Alkaline Phosphatase: 79 U/L (ref 40–150)
Glucose: 89 mg/dl (ref 70–99)
Potassium: 4 mEq/L (ref 3.5–5.1)
Sodium: 123 mEq/L — ABNORMAL LOW (ref 136–145)
Total Protein: 6.2 g/dL — ABNORMAL LOW (ref 6.4–8.3)

## 2012-11-23 LAB — CBC WITH DIFFERENTIAL/PLATELET
BASO%: 0.1 % (ref 0.0–2.0)
Basophils Absolute: 0 10*3/uL (ref 0.0–0.1)
EOS%: 0.1 % (ref 0.0–7.0)
HGB: 12.6 g/dL — ABNORMAL LOW (ref 13.0–17.1)
MCH: 29.8 pg (ref 27.2–33.4)
RBC: 4.23 10*6/uL (ref 4.20–5.82)
RDW: 15.5 % — ABNORMAL HIGH (ref 11.0–14.6)
lymph#: 2.4 10*3/uL (ref 0.9–3.3)
nRBC: 0 % (ref 0–0)

## 2012-11-23 MED ORDER — TEMAZEPAM 30 MG PO CAPS: 30.0000 mg | ORAL_CAPSULE | Freq: Every evening | ORAL | Status: DC | PRN

## 2012-11-23 MED ORDER — DIPHENHYDRAMINE HCL 50 MG/ML IJ SOLN
50.0000 mg | Freq: Once | INTRAMUSCULAR | Status: AC
  Administered 2012-11-23: 50 mg via INTRAVENOUS

## 2012-11-23 MED ORDER — DEXAMETHASONE SODIUM PHOSPHATE 20 MG/5ML IJ SOLN
20.0000 mg | Freq: Once | INTRAMUSCULAR | Status: AC
  Administered 2012-11-23: 20 mg via INTRAVENOUS

## 2012-11-23 MED ORDER — SODIUM CHLORIDE 0.9 % IV SOLN
675.0000 mg | Freq: Once | INTRAVENOUS | Status: AC
  Administered 2012-11-23: 680 mg via INTRAVENOUS
  Filled 2012-11-23: qty 68

## 2012-11-23 MED ORDER — ONDANSETRON 16 MG/50ML IVPB (CHCC)
16.0000 mg | Freq: Once | INTRAVENOUS | Status: AC
  Administered 2012-11-23: 16 mg via INTRAVENOUS

## 2012-11-23 MED ORDER — FAMOTIDINE IN NACL 20-0.9 MG/50ML-% IV SOLN
20.0000 mg | Freq: Once | INTRAVENOUS | Status: AC
  Administered 2012-11-23: 20 mg via INTRAVENOUS

## 2012-11-23 MED ORDER — HYDROCODONE-ACETAMINOPHEN 7.5-325 MG PO TABS: 1.0000 | ORAL_TABLET | ORAL | Status: DC | PRN

## 2012-11-23 MED ORDER — VARENICLINE TARTRATE 0.5 MG X 11 & 1 MG X 42 PO MISC: ORAL | Status: DC

## 2012-11-23 MED ORDER — PACLITAXEL CHEMO INJECTION 300 MG/50ML
175.0000 mg/m2 | Freq: Once | INTRAVENOUS | Status: AC
  Administered 2012-11-23: 336 mg via INTRAVENOUS
  Filled 2012-11-23: qty 56

## 2012-11-23 MED ORDER — SODIUM CHLORIDE 0.9 % IV SOLN
Freq: Once | INTRAVENOUS | Status: AC
  Administered 2012-11-23: 11:00:00 via INTRAVENOUS

## 2012-11-23 NOTE — Telephone Encounter (Signed)
Per staff phone call and POF I have schedueld appts.  JMW  

## 2012-11-23 NOTE — Telephone Encounter (Signed)
Gave pt  appt for lab, ML and chemo for May and June 2014, pt will see ML again due to MD being on [facility]

## 2012-11-23 NOTE — Addendum Note (Signed)
Addended by: Charma Igo on: 11/23/2012 02:44 PM   Modules accepted: Orders

## 2012-11-23 NOTE — Progress Notes (Signed)
[facility] [phone]   Fax:([phone]  OFFICE PROGRESS NOTE  [name], MD [address] [place] [address]  DIAGNOSIS: Stage IIA (T2 B., N0, M0) non-small cell lung cancer, squamous cell carcinoma diagnosed in December of 2013  PRIOR THERAPY: Status post right upper lobectomy under the care of Dr. Donata Clay on 09/14/2012.  CURRENT THERAPY: Adjuvant chemotherapy with cisplatin 75 mg/M2 and docetaxel 75 mg/M2 with Neulasta support every 3 weeks, status post 1 cycle. Starting with cycle #2, patient will receive carboplatin for an AUC initially of 4.5 and paclitaxel at 175 mg per meter squared with Neulasta support given every 3 weeks  INTERVAL HISTORY: [name] 72 y.o. male returns to the clinic today for followup visit accompanied by his wife and daughter, [name]. The patient is status post 1 cycle of the chemotherapy was cisplatin and docetaxel.  He had significant nausea and vomiting as well as dehydration and poor appetite since his first treatment. He reports that he can "smell the chemotherapy" through the skin, urine and stool. He now has the Zofran ODT 8 mg tablets at home for nausea. Compazine was not helpful at all for his nausea and vomiting associated with his first cycle of chemotherapy. He does report that his cough has decreased. He continues to have some rib cage pain particularly on the right side and the aunt on the lower sternum. He requests a refill for his hydrocodone. He also complains of difficulty sleeping. He reports his only sleeping approximately 3 hours. Tylenol PM was of no help at all. He was prescribed Klonopin for his anxiety however, he only takes this once or twice a week and usually during the day.  He denied having any  shortness of breath, cough or hemoptysis. He gained a few pounds since his last visit. He denied having any significant fever or chills. He is here to proceed with his second cycle of adjuvant chemotherapy. He  states that Dr. Arbutus Ped told him that he would change his chemotherapy medications since he had such her left eye but the first cycle with cisplatin and docetaxel.  MEDICAL HISTORY: Past Medical History  Diagnosis Date  . Arthritis   . Wheezing   . Sore throat   . Carotid artery occlusion   . COPD (chronic obstructive pulmonary disease)   . Lumbar disc disease   . Restless leg syndrome   . Allergic rhinitis   . Hypertension   . Anxiety     anxiety  . Shortness of breath   . Lung mass   . Pneumonia     ? now  . GERD (gastroesophageal reflux disease)   . H/O hiatal hernia   . Cancer     lung mass  . Pre-operative cardiovascular examination   . Nonspecific abnormal unspecified cardiovascular function study   . Peripheral vascular disease, unspecified     ALLERGIES:  has No Known Allergies.  MEDICATIONS:  Current Outpatient Prescriptions  Medication Sig Dispense Refill  . albuterol (PROAIR HFA) 108 (90 BASE) MCG/ACT inhaler Inhale 2 puffs into the lungs every 6 (six) hours as needed for shortness of breath.  1 Inhaler  0  . aspirin EC 81 MG tablet Take 81 mg by mouth daily.      . clonazePAM (KLONOPIN) 0.5 MG tablet Take 0.5 mg by mouth 2 (two) times daily as needed for anxiety.      Marland Kitchen dexamethasone (DECADRON) 4 MG tablet 4 Milligram by mouth  twice a day for the next 5 days, repeat in 3 weeks as needed.  20 tablet  0  . HYDROcodone-acetaminophen (NORCO) 7.5-325 MG per tablet Take 1 tablet by mouth every 4 (four) hours as needed for pain.  40 tablet  0  . isosorbide mononitrate (IMDUR) 30 MG 24 hr tablet Take 30 mg by mouth daily.      Marland Kitchen loratadine (CLARITIN) 10 MG tablet Take 10 mg by mouth daily.      . magnesium hydroxide (MILK OF MAGNESIA) 400 MG/5ML suspension Take 5 mLs by mouth daily as needed for constipation.      Marland Kitchen omeprazole (PRILOSEC) 40 MG capsule Take 40 mg by mouth daily.      . ondansetron (ZOFRAN ODT) 8 MG disintegrating tablet Take 1 tablet (8 mg total) by  mouth every 8 (eight) hours as needed for nausea.  30 tablet  0  . pramipexole (MIRAPEX) 0.5 MG tablet Take 1-1.5 mg by mouth at bedtime as needed. Restless legs      . prochlorperazine (COMPAZINE) 10 MG tablet Take 1 tablet (10 mg total) by mouth every 6 (six) hours as needed.  60 tablet  0  . pseudoephedrine-guaifenesin (MUCINEX D) 60-600 MG per tablet Take 1 tablet by mouth every 12 (twelve) hours.      . temazepam (RESTORIL) 30 MG capsule Take 1 capsule (30 mg total) by mouth at bedtime as needed for sleep.  30 capsule  0   No current facility-administered medications for this visit.    SURGICAL HISTORY:  Past Surgical History  Procedure Laterality Date  . Carotid endarterectomy  10/15/2010    left  . Hand surgery      repair of the left long, ring, and small finger after a saw injury  . Video bronchoscopy  07/24/2012    Procedure: VIDEO BRONCHOSCOPY WITH FLUORO;  Surgeon: Barbaraann Share, MD;  Location: [facility];  Service: Cardiopulmonary;  Laterality: Bilateral;  . Lobectomy Right 09/14/2012    Procedure: LOBECTOMY;  Surgeon: Kerin Perna, MD;  Location: [facility] OR;  Service: Thoracic;  Laterality: Right;  RIGHT UPPER LOBECTOMY  . Video assisted thoracoscopy Right 09/14/2012    Procedure: VIDEO ASSISTED THORACOSCOPY;  Surgeon: Kerin Perna, MD;  Location: [facility] OR;  Service: Thoracic;  Laterality: Right;    REVIEW OF SYSTEMS:  A comprehensive review of systems was negative except for: Constitutional: positive for anorexia and fatigue Gastrointestinal: positive for nausea and vomiting Musculoskeletal: positive for bone pain and myalgias   PHYSICAL EXAMINATION: General appearance: alert, cooperative, fatigued and no distress Head: Normocephalic, without obvious abnormality, atraumatic Neck: no adenopathy Lymph nodes: Cervical, supraclavicular, and axillary nodes normal. Resp: clear to auscultation bilaterally Cardio: regular rate and rhythm, S1, S2 normal, no murmur, click, rub or  gallop GI: soft, non-tender; bowel sounds normal; no masses,  no organomegaly Extremities: extremities normal, atraumatic, no cyanosis or edema Neurologic: Alert and oriented X 3, normal strength and tone. Normal symmetric reflexes. Normal coordination and gait  ECOG PERFORMANCE STATUS: 1 - Symptomatic but completely ambulatory  Blood pressure 151/89, pulse 79, temperature 97.6 F (36.4 C), temperature source Oral, resp. rate 19, height 5\' 9"  (1.753 m), weight 169 lb 4.8 oz (76.794 kg).  LABORATORY DATA: Lab Results  Component Value Date   WBC 13.7* 11/23/2012   HGB 12.6* 11/23/2012   HCT 36.6* 11/23/2012   MCV 86.5 11/23/2012   PLT 568* 11/23/2012      Chemistry      Component Value  Date/Time   NA 129* 11/16/2012 0847   NA 134* 09/19/2012 0356   K 3.8 11/16/2012 0847   K 3.3* 09/19/2012 0356   CL 96* 11/16/2012 0847   CL 96 09/19/2012 0356   CO2 26 11/16/2012 0847   CO2 32 09/19/2012 0356   BUN 7.6 11/16/2012 0847   BUN 9 09/19/2012 0356   CREATININE 0.6* 11/16/2012 0847   CREATININE 0.65 09/19/2012 0356      Component Value Date/Time   CALCIUM 8.3* 11/16/2012 0847   CALCIUM 8.9 09/19/2012 0356   ALKPHOS 80 11/16/2012 0847   ALKPHOS 80 09/16/2012 0420   AST 14 11/16/2012 0847   AST 35 09/16/2012 0420   ALT 17 11/16/2012 0847   ALT 19 09/16/2012 0420   BILITOT 0.20 11/16/2012 0847   BILITOT 0.4 09/16/2012 0420       RADIOGRAPHIC STUDIES: Dg Chest 2 View  10/11/2012  *RADIOLOGY REPORT*  Clinical Data: History of right VATS with right upper lobectomy for resection of adenocarcinoma, follow-up  CHEST - 2 VIEW  Comparison: Chest x-ray of 09/20/2012  Findings: The previously noted anteriorly loculated right hydropneumothorax has resolved.  Volume loss persists on the right after right upper lobectomy.  The left lung is clear.  No evidence of recurrence of lung carcinoma is seen.  Heart size is stable.  No bony abnormality is noted.  IMPRESSION: Resolution of anteriorly loculated right hydropneumothorax.  Postoperative changes remain on the right with volume loss.   Original Report Authenticated By: Dwyane Dee, M.D.     ASSESSMENT/PLAN: This is a very pleasant 72 years old white male with history of stage II a non-small cell lung cancer status post right upper lobectomy and currently undergoing adjuvant chemotherapy with cisplatin and docetaxel status post 1 cycle. His chemotherapy was complicated with significant nausea and vomiting as well as dehydration. Patient was discussed with Dr. Arbutus Ped. His chemotherapy will be changed to carboplatin initially for an AUC of 4.5 and paclitaxel at 175 mg per meter squared with Neulasta support due to his significant difficulty with nausea and vomiting with his first cycle of cisplatin and docetaxel. To address his complaints of insomnia he is given a prescription for Restoril 30 mg, one tablet by mouth at bedtime as needed for insomnia. His hydrocodone tablets are also refilled, 7.5 mg/325, one tablet by mouth every 4 hours as needed for pain a total 40 with no refill. He'll continue with weekly labs consisting of a CBC differential and C. met. He'll return in 3 weeks prior to his next scheduled cycle of adjuvant chemotherapy.  Keahi Mccarney E, PA-C   He was advised to call immediately if he has any concerning symptoms in the interval.  All questions were answered. The patient knows to call the clinic with any problems, questions or concerns. We can certainly see the patient much sooner if necessary.  I spent 20 minutes counseling the patient face to face. The total time spent in the appointment was 30 minutes.

## 2012-11-23 NOTE — Patient Instructions (Addendum)
Continue weekly labs as scheduled Followup with Dr. Arbutus Ped in 3 weeks prior to your next scheduled cycle of adjuvant chemotherapy

## 2012-11-23 NOTE — Patient Instructions (Signed)
[facility] Discharge Instructions for Patients Receiving Chemotherapy  Today you received the following chemotherapy agents: taxol, carboplatin.  To help prevent nausea and vomiting after your treatment, we encourage you to take your nausea medication.  Take it as often as prescribed.     If you develop nausea and vomiting that is not controlled by your nausea medication, call the clinic. If it is after clinic hours your family physician or the after hours number for the clinic or go to the Emergency Department.   BELOW ARE SYMPTOMS THAT SHOULD BE REPORTED IMMEDIATELY:  *FEVER GREATER THAN 100.5 F  *CHILLS WITH OR WITHOUT FEVER  NAUSEA AND VOMITING THAT IS NOT CONTROLLED WITH YOUR NAUSEA MEDICATION  *UNUSUAL SHORTNESS OF BREATH  *UNUSUAL BRUISING OR BLEEDING  TENDERNESS IN MOUTH AND THROAT WITH OR WITHOUT PRESENCE OF ULCERS  *URINARY PROBLEMS  *BOWEL PROBLEMS  UNUSUAL RASH Items with * indicate a potential emergency and should be followed up as soon as possible.  One of the nurses will contact you 24 hours after your treatment. Please let the nurse know about any problems that you may have experienced. Feel free to call the clinic you have any questions or concerns. The clinic phone number is [phone].   I have been informed and understand all the instructions given to me. I know to contact the clinic, my physician, or go to the Emergency Department if any problems should occur. I do not have any questions at this time, but understand that I may call the clinic during office hours   should I have any questions or need assistance in obtaining follow up care.    __________________________________________  _____________  __________ Signature of Patient or Authorized Representative            Date                   Time    __________________________________________ Nurse's Signature   Paclitaxel injection (Taxol) What is this medicine? PACLITAXEL  (PAK li TAX el) is a chemotherapy drug. It targets fast dividing cells, like cancer cells, and causes these cells to die. This medicine is used to treat ovarian cancer, breast cancer, and other cancers. This medicine may be used for other purposes; ask your health care provider or pharmacist if you have questions. What should I tell my health care provider before I take this medicine? They need to know if you have any of these conditions: -blood disorders -irregular heartbeat -infection (especially a virus infection such as chickenpox, cold sores, or herpes) -liver disease -previous or ongoing radiation therapy -an unusual or allergic reaction to paclitaxel, alcohol, polyoxyethylated castor oil, other chemotherapy agents, other medicines, foods, dyes, or preservatives -pregnant or trying to get pregnant -breast-feeding How should I use this medicine? This drug is given as an infusion into a vein. It is administered in a hospital or clinic by a specially trained health care professional. Talk to your pediatrician regarding the use of this medicine in children. Special care may be needed. Overdosage: If you think you have taken too much of this medicine contact a poison control center or emergency room at once. NOTE: This medicine is only for you. Do not share this medicine with others. What if I miss a dose? It is important not to miss your dose. Call your doctor or health care professional if you are unable to keep an appointment. What may interact with this medicine? Do not take this medicine with any of  the following medications: -disulfiram -metronidazole This medicine may also interact with the following medications: -cyclosporine -dexamethasone -diazepam -ketoconazole -medicines to increase blood counts like filgrastim, pegfilgrastim, sargramostim -other chemotherapy drugs like cisplatin, doxorubicin, epirubicin, etoposide, teniposide,  vincristine -quinidine -testosterone -vaccines -verapamil Talk to your doctor or health care professional before taking any of these medicines: -acetaminophen -aspirin -ibuprofen -ketoprofen -naproxen This list may not describe all possible interactions. Give your health care provider a list of all the medicines, herbs, non-prescription drugs, or dietary supplements you use. Also tell them if you smoke, drink alcohol, or use illegal drugs. Some items may interact with your medicine. What should I watch for while using this medicine? Your condition will be monitored carefully while you are receiving this medicine. You will need important blood work done while you are taking this medicine. This drug may make you feel generally unwell. This is not uncommon, as chemotherapy can affect healthy cells as well as cancer cells. Report any side effects. Continue your course of treatment even though you feel ill unless your doctor tells you to stop. In some cases, you may be given additional medicines to help with side effects. Follow all directions for their use. Call your doctor or health care professional for advice if you get a fever, chills or sore throat, or other symptoms of a cold or flu. Do not treat yourself. This drug decreases your body's ability to fight infections. Try to avoid being around people who are sick. This medicine may increase your risk to bruise or bleed. Call your doctor or health care professional if you notice any unusual bleeding. Be careful brushing and flossing your teeth or using a toothpick because you may get an infection or bleed more easily. If you have any dental work done, tell your dentist you are receiving this medicine. Avoid taking products that contain aspirin, acetaminophen, ibuprofen, naproxen, or ketoprofen unless instructed by your doctor. These medicines may hide a fever. Do not become pregnant while taking this medicine. Women should inform their doctor if  they wish to become pregnant or think they might be pregnant. There is a potential for serious side effects to an unborn child. Talk to your health care professional or pharmacist for more information. Do not breast-feed an infant while taking this medicine. Men are advised not to father a child while receiving this medicine. What side effects may I notice from receiving this medicine? Side effects that you should report to your doctor or health care professional as soon as possible: -allergic reactions like skin rash, itching or hives, swelling of the face, lips, or tongue -low blood counts - This drug may decrease the number of white blood cells, red blood cells and platelets. You may be at increased risk for infections and bleeding. -signs of infection - fever or chills, cough, sore throat, pain or difficulty passing urine -signs of decreased platelets or bleeding - bruising, pinpoint red spots on the skin, black, tarry stools, nosebleeds -signs of decreased red blood cells - unusually weak or tired, fainting spells, lightheadedness -breathing problems -chest pain -high or low blood pressure -mouth sores -nausea and vomiting -pain, swelling, redness or irritation at the injection site -pain, tingling, numbness in the hands or feet -slow or irregular heartbeat -swelling of the ankle, feet, hands Side effects that usually do not require medical attention (report to your doctor or health care professional if they continue or are bothersome): -bone pain -complete hair loss including hair on your head, underarms, pubic hair, eyebrows,  and eyelashes -changes in the color of fingernails -diarrhea -loosening of the fingernails -loss of appetite -muscle or joint pain -red flush to skin -sweating This list may not describe all possible side effects. Call your doctor for medical advice about side effects. You may report side effects to FDA at 1-800-FDA-1088. Where should I keep my  medicine? This drug is given in a hospital or clinic and will not be stored at home. NOTE: This sheet is a summary. It may not cover all possible information. If you have questions about this medicine, talk to your doctor, pharmacist, or health care provider.  2012, [employer]. (06/10/2008 11:54:26 AM)   Carboplatin injection What is this medicine? CARBOPLATIN (KAR boe pla tin) is a chemotherapy drug. It targets fast dividing cells, like cancer cells, and causes these cells to die. This medicine is used to treat ovarian cancer and many other cancers. This medicine may be used for other purposes; ask your health care provider or pharmacist if you have questions. What should I tell my health care provider before I take this medicine? They need to know if you have any of these conditions: -blood disorders -hearing problems -kidney disease -recent or ongoing radiation therapy -an unusual or allergic reaction to carboplatin, cisplatin, other chemotherapy, other medicines, foods, dyes, or preservatives -pregnant or trying to get pregnant -breast-feeding How should I use this medicine? This drug is usually given as an infusion into a vein. It is administered in a hospital or clinic by a specially trained health care professional. Talk to your pediatrician regarding the use of this medicine in children. Special care may be needed. Overdosage: If you think you have taken too much of this medicine contact a poison control center or emergency room at once. NOTE: This medicine is only for you. Do not share this medicine with others. What if I miss a dose? It is important not to miss a dose. Call your doctor or health care professional if you are unable to keep an appointment. What may interact with this medicine? -medicines for seizures -medicines to increase blood counts like filgrastim, pegfilgrastim, sargramostim -some antibiotics like amikacin, gentamicin, neomycin, streptomycin,  tobramycin -vaccines Talk to your doctor or health care professional before taking any of these medicines: -acetaminophen -aspirin -ibuprofen -ketoprofen -naproxen This list may not describe all possible interactions. Give your health care provider a list of all the medicines, herbs, non-prescription drugs, or dietary supplements you use. Also tell them if you smoke, drink alcohol, or use illegal drugs. Some items may interact with your medicine. What should I watch for while using this medicine? Your condition will be monitored carefully while you are receiving this medicine. You will need important blood work done while you are taking this medicine. This drug may make you feel generally unwell. This is not uncommon, as chemotherapy can affect healthy cells as well as cancer cells. Report any side effects. Continue your course of treatment even though you feel ill unless your doctor tells you to stop. In some cases, you may be given additional medicines to help with side effects. Follow all directions for their use. Call your doctor or health care professional for advice if you get a fever, chills or sore throat, or other symptoms of a cold or flu. Do not treat yourself. This drug decreases your body's ability to fight infections. Try to avoid being around people who are sick. This medicine may increase your risk to bruise or bleed. Call your doctor or health care  professional if you notice any unusual bleeding. Be careful brushing and flossing your teeth or using a toothpick because you may get an infection or bleed more easily. If you have any dental work done, tell your dentist you are receiving this medicine. Avoid taking products that contain aspirin, acetaminophen, ibuprofen, naproxen, or ketoprofen unless instructed by your doctor. These medicines may hide a fever. Do not become pregnant while taking this medicine. Women should inform their doctor if they wish to become pregnant or think  they might be pregnant. There is a potential for serious side effects to an unborn child. Talk to your health care professional or pharmacist for more information. Do not breast-feed an infant while taking this medicine. What side effects may I notice from receiving this medicine? Side effects that you should report to your doctor or health care professional as soon as possible: -allergic reactions like skin rash, itching or hives, swelling of the face, lips, or tongue -signs of infection - fever or chills, cough, sore throat, pain or difficulty passing urine -signs of decreased platelets or bleeding - bruising, pinpoint red spots on the skin, black, tarry stools, nosebleeds -signs of decreased red blood cells - unusually weak or tired, fainting spells, lightheadedness -breathing problems -changes in hearing -changes in vision -chest pain -high blood pressure -low blood counts - This drug may decrease the number of white blood cells, red blood cells and platelets. You may be at increased risk for infections and bleeding. -nausea and vomiting -pain, swelling, redness or irritation at the injection site -pain, tingling, numbness in the hands or feet -problems with balance, talking, walking -trouble passing urine or change in the amount of urine Side effects that usually do not require medical attention (report to your doctor or health care professional if they continue or are bothersome): -hair loss -loss of appetite -metallic taste in the mouth or changes in taste This list may not describe all possible side effects. Call your doctor for medical advice about side effects. You may report side effects to FDA at 1-800-FDA-1088. Where should I keep my medicine? This drug is given in a hospital or clinic and will not be stored at home. NOTE: This sheet is a summary. It may not cover all possible information. If you have questions about this medicine, talk to your doctor, pharmacist, or health care  provider.  2012, [employer]. (10/03/2007 2:38:05 PM)

## 2012-11-23 NOTE — Telephone Encounter (Signed)
Called in chantix starter pak and pt notified

## 2012-11-24 ENCOUNTER — Telehealth: Payer: Self-pay | Admitting: *Deleted

## 2012-11-24 ENCOUNTER — Ambulatory Visit (HOSPITAL_BASED_OUTPATIENT_CLINIC_OR_DEPARTMENT_OTHER): Payer: Medicare Other

## 2012-11-24 DIAGNOSIS — C3410 Malignant neoplasm of upper lobe, unspecified bronchus or lung: Secondary | ICD-10-CM

## 2012-11-24 MED ORDER — DEMECLOCYCLINE HCL 150 MG PO TABS: 150.0000 mg | ORAL_TABLET | Freq: Two times a day (BID) | ORAL | Status: DC

## 2012-11-24 MED ORDER — PEGFILGRASTIM INJECTION 6 MG/0.6ML
6.0000 mg | Freq: Once | SUBCUTANEOUS | Status: AC
  Administered 2012-11-24: 6 mg via SUBCUTANEOUS
  Filled 2012-11-24: qty 0.6

## 2012-11-24 NOTE — Telephone Encounter (Signed)
Spoke to pt's daughter Amada Jupiter, she verbalized understanding regarding new rx for sodium.  SLJ

## 2012-11-24 NOTE — Telephone Encounter (Signed)
Message copied by Caren Griffins on Fri Nov 24, 2012  4:50 PM ------      Message from: Conni Slipper      Created: Fri Nov 24, 2012 11:53 AM       Abnormal results, please call in following prescription and notify patient Sodium is low. Start demeclocycline 150 mg bid. Dispense #60 x 1 refill ------

## 2012-11-24 NOTE — Telephone Encounter (Signed)
[name] here for Neulasta injection following 1st TC chemo treatment.  He states that he is feeling good, better than he has in a long time.  No nausea, vomiting or diarrhea.  Knows to call the office if he has any problems or concerns.

## 2012-11-27 ENCOUNTER — Telehealth: Payer: Self-pay | Admitting: *Deleted

## 2012-11-27 NOTE — Telephone Encounter (Signed)
Pt's wife called wanting to know if pt needs to limit his physical activity since his Na+ level is so low.  Per Dr Donnald Garre, pt does not need to limit his activity.  Pt's wife verbalized understanding.  SLJ

## 2012-11-30 ENCOUNTER — Other Ambulatory Visit (HOSPITAL_BASED_OUTPATIENT_CLINIC_OR_DEPARTMENT_OTHER): Payer: Medicare Other | Admitting: Lab

## 2012-11-30 DIAGNOSIS — C3410 Malignant neoplasm of upper lobe, unspecified bronchus or lung: Secondary | ICD-10-CM

## 2012-11-30 LAB — COMPREHENSIVE METABOLIC PANEL (CC13)
AST: 17 U/L (ref 5–34)
BUN: 9.8 mg/dL (ref 7.0–26.0)
Calcium: 8.7 mg/dL (ref 8.4–10.4)
Chloride: 100 mEq/L (ref 98–107)
Creatinine: 0.7 mg/dL (ref 0.7–1.3)

## 2012-11-30 LAB — CBC WITH DIFFERENTIAL/PLATELET
Basophils Absolute: 0 10*3/uL (ref 0.0–0.1)
EOS%: 0.3 % (ref 0.0–7.0)
HCT: 35.8 % — ABNORMAL LOW (ref 38.4–49.9)
HGB: 11.4 g/dL — ABNORMAL LOW (ref 13.0–17.1)
MCH: 28.7 pg (ref 27.2–33.4)
MCV: 89.7 fL (ref 79.3–98.0)
NEUT%: 76.3 % — ABNORMAL HIGH (ref 39.0–75.0)
Platelets: 296 10*3/uL (ref 140–400)
lymph#: 3.4 10*3/uL — ABNORMAL HIGH (ref 0.9–3.3)

## 2012-11-30 LAB — MAGNESIUM (CC13): Magnesium: 2.1 mg/dl (ref 1.5–2.5)

## 2012-12-07 ENCOUNTER — Other Ambulatory Visit (HOSPITAL_BASED_OUTPATIENT_CLINIC_OR_DEPARTMENT_OTHER): Payer: Medicare Other | Admitting: Lab

## 2012-12-07 DIAGNOSIS — C3490 Malignant neoplasm of unspecified part of unspecified bronchus or lung: Secondary | ICD-10-CM

## 2012-12-07 LAB — CBC WITH DIFFERENTIAL/PLATELET
BASO%: 0.4 % (ref 0.0–2.0)
Basophils Absolute: 0 10*3/uL (ref 0.0–0.1)
EOS%: 0.2 % (ref 0.0–7.0)
Eosinophils Absolute: 0 10*3/uL (ref 0.0–0.5)
HCT: 34.3 % — ABNORMAL LOW (ref 38.4–49.9)
HGB: 11.4 g/dL — ABNORMAL LOW (ref 13.0–17.1)
LYMPH%: 18.8 % (ref 14.0–49.0)
MCH: 29.9 pg (ref 27.2–33.4)
MCHC: 33.1 g/dL (ref 32.0–36.0)
MCV: 90.2 fL (ref 79.3–98.0)
MONO#: 0.8 10*3/uL (ref 0.1–0.9)
MONO%: 7 % (ref 0.0–14.0)
NEUT#: 8.3 10*3/uL — ABNORMAL HIGH (ref 1.5–6.5)
NEUT%: 73.6 % (ref 39.0–75.0)
Platelets: 222 10*3/uL (ref 140–400)
RBC: 3.8 10*6/uL — ABNORMAL LOW (ref 4.20–5.82)
RDW: 16.9 % — ABNORMAL HIGH (ref 11.0–14.6)
WBC: 11.2 10*3/uL — ABNORMAL HIGH (ref 4.0–10.3)
lymph#: 2.1 10*3/uL (ref 0.9–3.3)

## 2012-12-07 LAB — MAGNESIUM (CC13): Magnesium: 2.2 mg/dl (ref 1.5–2.5)

## 2012-12-07 LAB — COMPREHENSIVE METABOLIC PANEL (CC13)
ALT: 18 U/L (ref 0–55)
AST: 16 U/L (ref 5–34)
CO2: 24 mEq/L (ref 22–29)
Creatinine: 0.7 mg/dL (ref 0.7–1.3)
Sodium: 134 mEq/L — ABNORMAL LOW (ref 136–145)
Total Bilirubin: 0.22 mg/dL (ref 0.20–1.20)
Total Protein: 6.3 g/dL — ABNORMAL LOW (ref 6.4–8.3)

## 2012-12-14 ENCOUNTER — Ambulatory Visit (HOSPITAL_BASED_OUTPATIENT_CLINIC_OR_DEPARTMENT_OTHER): Payer: Medicare Other | Admitting: Physician Assistant

## 2012-12-14 ENCOUNTER — Telehealth: Payer: Self-pay | Admitting: Gastroenterology

## 2012-12-14 ENCOUNTER — Encounter: Payer: Self-pay | Admitting: Physician Assistant

## 2012-12-14 ENCOUNTER — Telehealth: Payer: Self-pay | Admitting: Internal Medicine

## 2012-12-14 ENCOUNTER — Other Ambulatory Visit: Payer: Self-pay | Admitting: Internal Medicine

## 2012-12-14 ENCOUNTER — Ambulatory Visit (HOSPITAL_BASED_OUTPATIENT_CLINIC_OR_DEPARTMENT_OTHER): Payer: Medicare Other

## 2012-12-14 ENCOUNTER — Other Ambulatory Visit (HOSPITAL_BASED_OUTPATIENT_CLINIC_OR_DEPARTMENT_OTHER): Payer: Medicare Other

## 2012-12-14 DIAGNOSIS — C3410 Malignant neoplasm of upper lobe, unspecified bronchus or lung: Secondary | ICD-10-CM

## 2012-12-14 DIAGNOSIS — E860 Dehydration: Secondary | ICD-10-CM

## 2012-12-14 DIAGNOSIS — Z5111 Encounter for antineoplastic chemotherapy: Secondary | ICD-10-CM

## 2012-12-14 DIAGNOSIS — G8918 Other acute postprocedural pain: Secondary | ICD-10-CM

## 2012-12-14 DIAGNOSIS — M899 Disorder of bone, unspecified: Secondary | ICD-10-CM

## 2012-12-14 DIAGNOSIS — M949 Disorder of cartilage, unspecified: Secondary | ICD-10-CM

## 2012-12-14 LAB — CBC WITH DIFFERENTIAL/PLATELET
BASO%: 1 % (ref 0.0–2.0)
Basophils Absolute: 0.1 10*3/uL (ref 0.0–0.1)
EOS%: 0.3 % (ref 0.0–7.0)
HCT: 36.2 % — ABNORMAL LOW (ref 38.4–49.9)
HGB: 12.1 g/dL — ABNORMAL LOW (ref 13.0–17.1)
LYMPH%: 28.8 % (ref 14.0–49.0)
MCH: 30.1 pg (ref 27.2–33.4)
MCHC: 33.4 g/dL (ref 32.0–36.0)
MCV: 90 fL (ref 79.3–98.0)
MONO%: 8.6 % (ref 0.0–14.0)
NEUT%: 61.3 % (ref 39.0–75.0)
Platelets: 391 10*3/uL (ref 140–400)
lymph#: 2.1 10*3/uL (ref 0.9–3.3)

## 2012-12-14 LAB — COMPREHENSIVE METABOLIC PANEL (CC13)
Alkaline Phosphatase: 130 U/L (ref 40–150)
BUN: 7.6 mg/dL (ref 7.0–26.0)
CO2: 25 mEq/L (ref 22–29)
Glucose: 91 mg/dl (ref 70–99)
Sodium: 136 mEq/L (ref 136–145)
Total Bilirubin: 0.34 mg/dL (ref 0.20–1.20)
Total Protein: 6.7 g/dL (ref 6.4–8.3)

## 2012-12-14 MED ORDER — DEXAMETHASONE SODIUM PHOSPHATE 20 MG/5ML IJ SOLN
20.0000 mg | Freq: Once | INTRAMUSCULAR | Status: AC
  Administered 2012-12-14: 20 mg via INTRAVENOUS

## 2012-12-14 MED ORDER — SODIUM CHLORIDE 0.9 % IV SOLN
590.0000 mg | Freq: Once | INTRAVENOUS | Status: AC
  Administered 2012-12-14: 590 mg via INTRAVENOUS
  Filled 2012-12-14: qty 59

## 2012-12-14 MED ORDER — FAMOTIDINE IN NACL 20-0.9 MG/50ML-% IV SOLN
20.0000 mg | Freq: Once | INTRAVENOUS | Status: AC
  Administered 2012-12-14: 20 mg via INTRAVENOUS

## 2012-12-14 MED ORDER — ONDANSETRON 16 MG/50ML IVPB (CHCC)
16.0000 mg | Freq: Once | INTRAVENOUS | Status: AC
  Administered 2012-12-14: 16 mg via INTRAVENOUS

## 2012-12-14 MED ORDER — DIPHENHYDRAMINE HCL 50 MG/ML IJ SOLN
50.0000 mg | Freq: Once | INTRAMUSCULAR | Status: AC
  Administered 2012-12-14: 50 mg via INTRAVENOUS

## 2012-12-14 MED ORDER — SODIUM CHLORIDE 0.9 % IV SOLN
Freq: Once | INTRAVENOUS | Status: AC
  Administered 2012-12-14: 11:00:00 via INTRAVENOUS

## 2012-12-14 MED ORDER — HYDROCODONE-ACETAMINOPHEN 7.5-325 MG PO TABS: 1.0000 | ORAL_TABLET | ORAL | Status: DC | PRN

## 2012-12-14 MED ORDER — AZITHROMYCIN 250 MG PO TABS: ORAL_TABLET | ORAL | Status: DC

## 2012-12-14 MED ORDER — PACLITAXEL CHEMO INJECTION 300 MG/50ML
175.0000 mg/m2 | Freq: Once | INTRAVENOUS | Status: AC
  Administered 2012-12-14: 336 mg via INTRAVENOUS
  Filled 2012-12-14: qty 56

## 2012-12-14 NOTE — Patient Instructions (Addendum)
[facility] Discharge Instructions for Patients Receiving Chemotherapy  Today you received the following chemotherapy agents: taxol, carboplatin  To help prevent nausea and vomiting after your treatment, we encourage you to take your nausea medication.  Take it as often as prescribed.    If you develop nausea and vomiting that is not controlled by your nausea medication, call the clinic. If it is after clinic hours your family physician or the after hours number for the clinic or go to the Emergency Department.   BELOW ARE SYMPTOMS THAT SHOULD BE REPORTED IMMEDIATELY:  *FEVER GREATER THAN 100.5 F  *CHILLS WITH OR WITHOUT FEVER  NAUSEA AND VOMITING THAT IS NOT CONTROLLED WITH YOUR NAUSEA MEDICATION  *UNUSUAL SHORTNESS OF BREATH  *UNUSUAL BRUISING OR BLEEDING  TENDERNESS IN MOUTH AND THROAT WITH OR WITHOUT PRESENCE OF ULCERS  *URINARY PROBLEMS  *BOWEL PROBLEMS  UNUSUAL RASH Items with * indicate a potential emergency and should be followed up as soon as possible.  One of the nurses will contact you 24 hours after your treatment. Please let the nurse know about any problems that you may have experienced. Feel free to call the clinic you have any questions or concerns. The clinic phone number is [phone].   I have been informed and understand all the instructions given to me. I know to contact the clinic, my physician, or go to the Emergency Department if any problems should occur. I do not have any questions at this time, but understand that I may call the clinic during office hours   should I have any questions or need assistance in obtaining follow up care.    __________________________________________  _____________  __________ Signature of Patient or Authorized Representative            Date                   Time    __________________________________________ Nurse's Signature   Paclitaxel injection (Taxol) What is this medicine? PACLITAXEL (PAK  li TAX el) is a chemotherapy drug. It targets fast dividing cells, like cancer cells, and causes these cells to die. This medicine is used to treat ovarian cancer, breast cancer, and other cancers. This medicine may be used for other purposes; ask your health care provider or pharmacist if you have questions. What should I tell my health care provider before I take this medicine? They need to know if you have any of these conditions: -blood disorders -irregular heartbeat -infection (especially a virus infection such as chickenpox, cold sores, or herpes) -liver disease -previous or ongoing radiation therapy -an unusual or allergic reaction to paclitaxel, alcohol, polyoxyethylated castor oil, other chemotherapy agents, other medicines, foods, dyes, or preservatives -pregnant or trying to get pregnant -breast-feeding How should I use this medicine? This drug is given as an infusion into a vein. It is administered in a hospital or clinic by a specially trained health care professional. Talk to your pediatrician regarding the use of this medicine in children. Special care may be needed. Overdosage: If you think you have taken too much of this medicine contact a poison control center or emergency room at once. NOTE: This medicine is only for you. Do not share this medicine with others. What if I miss a dose? It is important not to miss your dose. Call your doctor or health care professional if you are unable to keep an appointment. What may interact with this medicine? Do not take this medicine with any of the   following medications: -disulfiram -metronidazole This medicine may also interact with the following medications: -cyclosporine -dexamethasone -diazepam -ketoconazole -medicines to increase blood counts like filgrastim, pegfilgrastim, sargramostim -other chemotherapy drugs like cisplatin, doxorubicin, epirubicin, etoposide, teniposide,  vincristine -quinidine -testosterone -vaccines -verapamil Talk to your doctor or health care professional before taking any of these medicines: -acetaminophen -aspirin -ibuprofen -ketoprofen -naproxen This list may not describe all possible interactions. Give your health care provider a list of all the medicines, herbs, non-prescription drugs, or dietary supplements you use. Also tell them if you smoke, drink alcohol, or use illegal drugs. Some items may interact with your medicine. What should I watch for while using this medicine? Your condition will be monitored carefully while you are receiving this medicine. You will need important blood work done while you are taking this medicine. This drug may make you feel generally unwell. This is not uncommon, as chemotherapy can affect healthy cells as well as cancer cells. Report any side effects. Continue your course of treatment even though you feel ill unless your doctor tells you to stop. In some cases, you may be given additional medicines to help with side effects. Follow all directions for their use. Call your doctor or health care professional for advice if you get a fever, chills or sore throat, or other symptoms of a cold or flu. Do not treat yourself. This drug decreases your body's ability to fight infections. Try to avoid being around people who are sick. This medicine may increase your risk to bruise or bleed. Call your doctor or health care professional if you notice any unusual bleeding. Be careful brushing and flossing your teeth or using a toothpick because you may get an infection or bleed more easily. If you have any dental work done, tell your dentist you are receiving this medicine. Avoid taking products that contain aspirin, acetaminophen, ibuprofen, naproxen, or ketoprofen unless instructed by your doctor. These medicines may hide a fever. Do not become pregnant while taking this medicine. Women should inform their doctor if  they wish to become pregnant or think they might be pregnant. There is a potential for serious side effects to an unborn child. Talk to your health care professional or pharmacist for more information. Do not breast-feed an infant while taking this medicine. Men are advised not to father a child while receiving this medicine. What side effects may I notice from receiving this medicine? Side effects that you should report to your doctor or health care professional as soon as possible: -allergic reactions like skin rash, itching or hives, swelling of the face, lips, or tongue -low blood counts - This drug may decrease the number of white blood cells, red blood cells and platelets. You may be at increased risk for infections and bleeding. -signs of infection - fever or chills, cough, sore throat, pain or difficulty passing urine -signs of decreased platelets or bleeding - bruising, pinpoint red spots on the skin, black, tarry stools, nosebleeds -signs of decreased red blood cells - unusually weak or tired, fainting spells, lightheadedness -breathing problems -chest pain -high or low blood pressure -mouth sores -nausea and vomiting -pain, swelling, redness or irritation at the injection site -pain, tingling, numbness in the hands or feet -slow or irregular heartbeat -swelling of the ankle, feet, hands Side effects that usually do not require medical attention (report to your doctor or health care professional if they continue or are bothersome): -bone pain -complete hair loss including hair on your head, underarms, pubic hair, eyebrows, and   eyelashes -changes in the color of fingernails -diarrhea -loosening of the fingernails -loss of appetite -muscle or joint pain -red flush to skin -sweating This list may not describe all possible side effects. Call your doctor for medical advice about side effects. You may report side effects to FDA at 1-800-FDA-1088. Where should I keep my  medicine? This drug is given in a hospital or clinic and will not be stored at home. NOTE: This sheet is a summary. It may not cover all possible information. If you have questions about this medicine, talk to your doctor, pharmacist, or health care provider.  2012, [employer]vier/Gold Standard. (06/10/2008 11:54:26 AM)  Carboplatin injection What is this medicine? CARBOPLATIN (KAR boe pla tin) is a chemotherapy drug. It targets fast dividing cells, like cancer cells, and causes these cells to die. This medicine is used to treat ovarian cancer and many other cancers. This medicine may be used for other purposes; ask your health care provider or pharmacist if you have questions. What should I tell my health care provider before I take this medicine? They need to know if you have any of these conditions: -blood disorders -hearing problems -kidney disease -recent or ongoing radiation therapy -an unusual or allergic reaction to carboplatin, cisplatin, other chemotherapy, other medicines, foods, dyes, or preservatives -pregnant or trying to get pregnant -breast-feeding How should I use this medicine? This drug is usually given as an infusion into a vein. It is administered in a hospital or clinic by a specially trained health care professional. Talk to your pediatrician regarding the use of this medicine in children. Special care may be needed. Overdosage: If you think you have taken too much of this medicine contact a poison control center or emergency room at once. NOTE: This medicine is only for you. Do not share this medicine with others. What if I miss a dose? It is important not to miss a dose. Call your doctor or health care professional if you are unable to keep an appointment. What may interact with this medicine? -medicines for seizures -medicines to increase blood counts like filgrastim, pegfilgrastim, sargramostim -some antibiotics like amikacin, gentamicin, neomycin, streptomycin,  tobramycin -vaccines Talk to your doctor or health care professional before taking any of these medicines: -acetaminophen -aspirin -ibuprofen -ketoprofen -naproxen This list may not describe all possible interactions. Give your health care provider a list of all the medicines, herbs, non-prescription drugs, or dietary supplements you use. Also tell them if you smoke, drink alcohol, or use illegal drugs. Some items may interact with your medicine. What should I watch for while using this medicine? Your condition will be monitored carefully while you are receiving this medicine. You will need important blood work done while you are taking this medicine. This drug may make you feel generally unwell. This is not uncommon, as chemotherapy can affect healthy cells as well as cancer cells. Report any side effects. Continue your course of treatment even though you feel ill unless your doctor tells you to stop. In some cases, you may be given additional medicines to help with side effects. Follow all directions for their use. Call your doctor or health care professional for advice if you get a fever, chills or sore throat, or other symptoms of a cold or flu. Do not treat yourself. This drug decreases your body's ability to fight infections. Try to avoid being around people who are sick. This medicine may increase your risk to bruise or bleed. Call your doctor or health care professional if   you notice any unusual bleeding. Be careful brushing and flossing your teeth or using a toothpick because you may get an infection or bleed more easily. If you have any dental work done, tell your dentist you are receiving this medicine. Avoid taking products that contain aspirin, acetaminophen, ibuprofen, naproxen, or ketoprofen unless instructed by your doctor. These medicines may hide a fever. Do not become pregnant while taking this medicine. Women should inform their doctor if they wish to become pregnant or think  they might be pregnant. There is a potential for serious side effects to an unborn child. Talk to your health care professional or pharmacist for more information. Do not breast-feed an infant while taking this medicine. What side effects may I notice from receiving this medicine? Side effects that you should report to your doctor or health care professional as soon as possible: -allergic reactions like skin rash, itching or hives, swelling of the face, lips, or tongue -signs of infection - fever or chills, cough, sore throat, pain or difficulty passing urine -signs of decreased platelets or bleeding - bruising, pinpoint red spots on the skin, black, tarry stools, nosebleeds -signs of decreased red blood cells - unusually weak or tired, fainting spells, lightheadedness -breathing problems -changes in hearing -changes in vision -chest pain -high blood pressure -low blood counts - This drug may decrease the number of white blood cells, red blood cells and platelets. You may be at increased risk for infections and bleeding. -nausea and vomiting -pain, swelling, redness or irritation at the injection site -pain, tingling, numbness in the hands or feet -problems with balance, talking, walking -trouble passing urine or change in the amount of urine Side effects that usually do not require medical attention (report to your doctor or health care professional if they continue or are bothersome): -hair loss -loss of appetite -metallic taste in the mouth or changes in taste This list may not describe all possible side effects. Call your doctor for medical advice about side effects. You may report side effects to FDA at 1-800-FDA-1088. Where should I keep my medicine? This drug is given in a hospital or clinic and will not be stored at home. NOTE: This sheet is a summary. It may not cover all possible information. If you have questions about this medicine, talk to your doctor, pharmacist, or health care  provider.  2012, Elsevier/Gold Standard. (10/03/2007 2:38:05 PM) 

## 2012-12-14 NOTE — Patient Instructions (Addendum)
Take the antibiotic as prescribed for your discolored nasal drainage Continue weekly labs as scheduled Follow up with Dr. Arbutus Ped in 3 weeks prior to cycle #4 of your adjuvant chemotherapy

## 2012-12-14 NOTE — Progress Notes (Signed)
[facility] [phone]   [phone]  OFFICE PROGRESS NOTE  [name], MD [address] 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 Donata Clay on 09/14/2012.  CURRENT THERAPY: Adjuvant chemotherapy with cisplatin 75 mg/M2 and docetaxel 75 mg/M2 with Neulasta support every 3 weeks, status post 1 cycle. Starting with cycle #2, patient will receive carboplatin for an AUC initially of 4.5 and paclitaxel at 175 mg per meter squared with Neulasta support given every 3 weeks. Status post a total of 2 cycles  INTERVAL HISTORY: [name] 72 y.o. male returns to the clinic today for followup visit accompanied by his wife. He status post his second cycle of adjuvant chemotherapy and overall did better with the carboplatin and paclitaxel the needed with cisplatin and docetaxel. He still had some significant bone and joint aches. The pain medication was helpful. He requests a refill for his pain medication. He does report that his left hip is still a bit sore. He developed what he describes as a small red rash about both ankles that is now resolved. The rash has not itchy in any way. His appetite is fair and he is still able to work. He complains of posterior headache, not associated with any vision changes or nausea or vomiting. He does have a "head cold and has had green nasal secretions not associated with fever or chills. He has occasional constipation but is usually able to normalize things by eating cooked apples or peaches or occasionally he'll use milk of magnesia with good results. He continues to smoke. He did try Chantix but after week didn't feel like it was working so discontinued it. He is willing to give a Chantix another try. He denied having any  shortness of breath, cough or hemoptysis.  He  denied having any fever or chills. He is here to proceed with his third cycle of adjuvant chemotherapy.  MEDICAL HISTORY: Past Medical History  Diagnosis Date  . Arthritis   . Wheezing   . Sore throat   . Carotid artery occlusion   . COPD (chronic obstructive pulmonary disease)   . Lumbar disc disease   . Restless leg syndrome   . Allergic rhinitis   . Hypertension   . Anxiety     anxiety  . Shortness of breath   . Lung mass   . Pneumonia     ? now  . GERD (gastroesophageal reflux disease)   . H/O hiatal hernia   . Cancer     lung mass  . Pre-operative cardiovascular examination   . Nonspecific abnormal unspecified cardiovascular function study   . Peripheral vascular disease, unspecified     ALLERGIES:  has No Known Allergies.  MEDICATIONS:  Current Outpatient Prescriptions  Medication Sig Dispense Refill  . albuterol (PROAIR HFA) 108 (90 BASE) MCG/ACT inhaler Inhale 2 puffs into the lungs every 6 (six) hours as needed for shortness of breath.  1 Inhaler  0  . aspirin EC 81 MG tablet Take 81 mg by mouth daily.      Marland Kitchen azithromycin (ZITHROMAX) 250 MG tablet Take 2 tablets by mouth on day one, then take 1 tablet by mouth daily until completed  6 each  0  . clonazePAM (KLONOPIN) 0.5 MG tablet Take 0.5 mg by mouth 2 (two)  times daily as needed for anxiety.      Marland Kitchen demeclocycline (DECLOMYCIN) 150 MG tablet Take 1 tablet (150 mg total) by mouth 2 (two) times daily.  60 tablet  1  . HYDROcodone-acetaminophen (NORCO) 7.5-325 MG per tablet Take 1 tablet by mouth every 4 (four) hours as needed for pain.  40 tablet  0  . isosorbide mononitrate (IMDUR) 30 MG 24 hr tablet Take 30 mg by mouth daily.      . magnesium hydroxide (MILK OF MAGNESIA) 400 MG/5ML suspension Take 5 mLs by mouth daily as needed for constipation.      Marland Kitchen omeprazole (PRILOSEC) 40 MG capsule Take 40 mg by mouth daily.      . ondansetron (ZOFRAN ODT) 8 MG disintegrating tablet Take 1 tablet (8 mg total) by mouth every 8  (eight) hours as needed for nausea.  30 tablet  0  . pramipexole (MIRAPEX) 0.5 MG tablet Take 1-1.5 mg by mouth at bedtime as needed. Restless legs      . prochlorperazine (COMPAZINE) 10 MG tablet Take 1 tablet (10 mg total) by mouth every 6 (six) hours as needed.  60 tablet  0  . pseudoephedrine-guaifenesin (MUCINEX D) 60-600 MG per tablet Take 1 tablet by mouth every 12 (twelve) hours.      . temazepam (RESTORIL) 30 MG capsule Take 1 capsule (30 mg total) by mouth at bedtime as needed for sleep.  30 capsule  0  . varenicline (CHANTIX STARTING MONTH PAK) 0.5 MG X 11 & 1 MG X 42 tablet Take one 0.5 mg tablet by mouth once daily for 3 days, then increase to one 0.5 mg tablet twice daily for 4 days, then increase to one 1 mg tablet twice daily.  53 tablet  0   No current facility-administered medications for this visit.    SURGICAL HISTORY:  Past Surgical History  Procedure Laterality Date  . Carotid endarterectomy  10/15/2010    left  . Hand surgery      repair of the left long, ring, and small finger after a saw injury  . Video bronchoscopy  07/24/2012    Procedure: VIDEO BRONCHOSCOPY WITH FLUORO;  Surgeon: Barbaraann Share, MD;  Location: [facility];  Service: Cardiopulmonary;  Laterality: Bilateral;  . Lobectomy Right 09/14/2012    Procedure: LOBECTOMY;  Surgeon: Kerin Perna, MD;  Location: [facility] OR;  Service: Thoracic;  Laterality: Right;  RIGHT UPPER LOBECTOMY  . Video assisted thoracoscopy Right 09/14/2012    Procedure: VIDEO ASSISTED THORACOSCOPY;  Surgeon: Kerin Perna, MD;  Location: [facility] OR;  Service: Thoracic;  Laterality: Right;    REVIEW OF SYSTEMS:  A comprehensive review of systems was negative except for: Ears, nose, mouth, throat, and face: positive for nasal congestion and green nasal secretions Musculoskeletal: positive for bone pain and myalgias Neurological: positive for headaches   PHYSICAL EXAMINATION: General appearance: alert, cooperative, fatigued and no  distress Head: Normocephalic, without obvious abnormality, atraumatic Neck: no adenopathy Lymph nodes: Cervical, supraclavicular, and axillary nodes normal. Resp: clear to auscultation bilaterally Cardio: regular rate and rhythm, S1, S2 normal, no murmur, click, rub or gallop GI: soft, non-tender; bowel sounds normal; no masses,  no organomegaly Extremities: extremities normal, atraumatic, no cyanosis or edema Neurologic: Alert and oriented X 3, normal strength and tone. Normal symmetric reflexes. Normal coordination and gait Skin: punctate brown, flat lesions scattered about both ankles  ECOG PERFORMANCE STATUS: 1 - Symptomatic but completely ambulatory  Blood pressure 147/90, pulse 102, temperature 97.1  F (36.2 C), temperature source Oral, resp. rate 18, height 5\' 9"  (1.753 m), weight 165 lb 11.2 oz (75.161 kg).  LABORATORY DATA: Lab Results  Component Value Date   WBC 7.3 12/14/2012   HGB 12.1* 12/14/2012   HCT 36.2* 12/14/2012   MCV 90.0 12/14/2012   PLT 391 12/14/2012      Chemistry      Component Value Date/Time   NA 134* 12/07/2012 0828   NA 134* 09/19/2012 0356   K 4.2 12/07/2012 0828   K 3.3* 09/19/2012 0356   CL 101 12/07/2012 0828   CL 96 09/19/2012 0356   CO2 24 12/07/2012 0828   CO2 32 09/19/2012 0356   BUN 8.8 12/07/2012 0828   BUN 9 09/19/2012 0356   CREATININE 0.7 12/07/2012 0828   CREATININE 0.65 09/19/2012 0356      Component Value Date/Time   CALCIUM 8.9 12/07/2012 0828   CALCIUM 8.9 09/19/2012 0356   ALKPHOS 121 12/07/2012 0828   ALKPHOS 80 09/16/2012 0420   AST 16 12/07/2012 0828   AST 35 09/16/2012 0420   ALT 18 12/07/2012 0828   ALT 19 09/16/2012 0420   BILITOT 0.22 12/07/2012 0828   BILITOT 0.4 09/16/2012 0420       RADIOGRAPHIC STUDIES: Dg Chest 2 View  10/11/2012  *RADIOLOGY REPORT*  Clinical Data: History of right VATS with right upper lobectomy for resection of adenocarcinoma, follow-up  CHEST - 2 VIEW  Comparison: Chest x-ray of 09/20/2012  Findings: The previously  noted anteriorly loculated right hydropneumothorax has resolved.  Volume loss persists on the right after right upper lobectomy.  The left lung is clear.  No evidence of recurrence of lung carcinoma is seen.  Heart size is stable.  No bony abnormality is noted.  IMPRESSION: Resolution of anteriorly loculated right hydropneumothorax. Postoperative changes remain on the right with volume loss.   Original Report Authenticated By: Dwyane Dee, M.D.     ASSESSMENT/PLAN: This is a very pleasant 71 years old white male with history of stage II a non-small cell lung cancer status post right upper lobectomy and currently undergoing adjuvant chemotherapy with cisplatin and docetaxel status post 1 cycle. His chemotherapy was complicated with significant nausea and vomiting as well as dehydration. Patient was discussed with Dr. Arbutus Ped. He will continue on his chemotherapy of carboplatin for an AUC of 4.5 and paclitaxel at 175 mg per meter squared with Neulasta support due to his significant difficulty with nausea and vomiting with his first cycle of cisplatin and docetaxel. The Restoril 30 mg, one tablet by mouth at bedtime is working for his insomnia. His hydrocodone tablets are also refilled, 7.5 mg/325, one tablet by mouth every 4 hours as needed for pain a total 40 with no refill. To address his signs and symptoms of suspected sinusitis a prescription for a Z-Pak was sent to his pharmacy of record via [facility]. A MRI of the brain with and without contrast performed 08/04/2012 revealed no acute or metastatic intracranial abnormalities. There was mild for age nonspecific signal changes most commonly due to chronic vessel disease. He'll continue with weekly labs consisting of a CBC differential and C. met. He'll follow up with Dr. Arbutus Ped in 3 weeks prior to his next scheduled cycle of adjuvant chemotherapy.  Jeremyah Jelley E, PA-C   He was advised to call immediately if he has any concerning symptoms in the  interval.  All questions were answered. The patient knows to call the clinic with any problems, questions or  concerns. We can certainly see the patient much sooner if necessary.  I spent 20 minutes counseling the patient face to face. The total time spent in the appointment was 30 minutes.

## 2012-12-15 ENCOUNTER — Ambulatory Visit: Payer: Medicare Other

## 2012-12-15 ENCOUNTER — Ambulatory Visit (HOSPITAL_BASED_OUTPATIENT_CLINIC_OR_DEPARTMENT_OTHER): Payer: Medicare Other

## 2012-12-15 DIAGNOSIS — C3410 Malignant neoplasm of upper lobe, unspecified bronchus or lung: Secondary | ICD-10-CM

## 2012-12-15 MED ORDER — PEGFILGRASTIM INJECTION 6 MG/0.6ML
6.0000 mg | Freq: Once | SUBCUTANEOUS | Status: AC
  Administered 2012-12-15: 6 mg via SUBCUTANEOUS
  Filled 2012-12-15: qty 0.6

## 2012-12-21 ENCOUNTER — Other Ambulatory Visit (HOSPITAL_BASED_OUTPATIENT_CLINIC_OR_DEPARTMENT_OTHER): Payer: Medicare Other

## 2012-12-21 DIAGNOSIS — C3410 Malignant neoplasm of upper lobe, unspecified bronchus or lung: Secondary | ICD-10-CM

## 2012-12-21 LAB — CBC WITH DIFFERENTIAL/PLATELET
Basophils Absolute: 0.2 10*3/uL — ABNORMAL HIGH (ref 0.0–0.1)
Eosinophils Absolute: 0.1 10*3/uL (ref 0.0–0.5)
HGB: 11 g/dL — ABNORMAL LOW (ref 13.0–17.1)
MONO#: 2 10*3/uL — ABNORMAL HIGH (ref 0.1–0.9)
MONO%: 7.3 % (ref 0.0–14.0)
NEUT#: 20.9 10*3/uL — ABNORMAL HIGH (ref 1.5–6.5)
RBC: 3.58 10*6/uL — ABNORMAL LOW (ref 4.20–5.82)
RDW: 18.4 % — ABNORMAL HIGH (ref 11.0–14.6)
WBC: 26.8 10*3/uL — ABNORMAL HIGH (ref 4.0–10.3)
lymph#: 3.7 10*3/uL — ABNORMAL HIGH (ref 0.9–3.3)

## 2012-12-21 LAB — COMPREHENSIVE METABOLIC PANEL (CC13)
Albumin: 3.3 g/dL — ABNORMAL LOW (ref 3.5–5.0)
Alkaline Phosphatase: 190 U/L — ABNORMAL HIGH (ref 40–150)
BUN: 8.6 mg/dL (ref 7.0–26.0)
CO2: 26 mEq/L (ref 22–29)
Calcium: 8.9 mg/dL (ref 8.4–10.4)
Chloride: 104 mEq/L (ref 98–107)
Glucose: 122 mg/dl — ABNORMAL HIGH (ref 70–99)
Potassium: 3.7 mEq/L (ref 3.5–5.1)
Sodium: 138 mEq/L (ref 136–145)
Total Protein: 6.1 g/dL — ABNORMAL LOW (ref 6.4–8.3)

## 2012-12-26 ENCOUNTER — Other Ambulatory Visit: Payer: Self-pay | Admitting: *Deleted

## 2012-12-26 DIAGNOSIS — C3490 Malignant neoplasm of unspecified part of unspecified bronchus or lung: Secondary | ICD-10-CM

## 2012-12-26 DIAGNOSIS — G4700 Insomnia, unspecified: Secondary | ICD-10-CM

## 2012-12-26 MED ORDER — TEMAZEPAM 30 MG PO CAPS: 30.0000 mg | ORAL_CAPSULE | Freq: Every evening | ORAL | Status: DC | PRN

## 2012-12-27 ENCOUNTER — Other Ambulatory Visit: Payer: Self-pay | Admitting: *Deleted

## 2012-12-27 DIAGNOSIS — G8918 Other acute postprocedural pain: Secondary | ICD-10-CM

## 2012-12-27 MED ORDER — HYDROCODONE-ACETAMINOPHEN 7.5-325 MG PO TABS: 1.0000 | ORAL_TABLET | ORAL | Status: DC | PRN

## 2012-12-28 ENCOUNTER — Other Ambulatory Visit (HOSPITAL_BASED_OUTPATIENT_CLINIC_OR_DEPARTMENT_OTHER): Payer: Medicare Other

## 2012-12-28 DIAGNOSIS — C3490 Malignant neoplasm of unspecified part of unspecified bronchus or lung: Secondary | ICD-10-CM

## 2012-12-28 LAB — CBC WITH DIFFERENTIAL/PLATELET
BASO%: 0.4 % (ref 0.0–2.0)
LYMPH%: 20.3 % (ref 14.0–49.0)
MCHC: 34.2 g/dL (ref 32.0–36.0)
MONO#: 0.6 10*3/uL (ref 0.1–0.9)
MONO%: 5.8 % (ref 0.0–14.0)
Platelets: 291 10*3/uL (ref 140–400)
RBC: 3.7 10*6/uL — ABNORMAL LOW (ref 4.20–5.82)
RDW: 18.7 % — ABNORMAL HIGH (ref 11.0–14.6)
WBC: 11.1 10*3/uL — ABNORMAL HIGH (ref 4.0–10.3)

## 2012-12-28 LAB — COMPREHENSIVE METABOLIC PANEL (CC13)
Alkaline Phosphatase: 133 U/L (ref 40–150)
Creatinine: 0.7 mg/dL (ref 0.7–1.3)
Glucose: 117 mg/dl — ABNORMAL HIGH (ref 70–99)
Sodium: 133 mEq/L — ABNORMAL LOW (ref 136–145)
Total Bilirubin: 0.25 mg/dL (ref 0.20–1.20)
Total Protein: 6.5 g/dL (ref 6.4–8.3)

## 2012-12-28 LAB — MAGNESIUM (CC13): Magnesium: 2.2 mg/dl (ref 1.5–2.5)

## 2013-01-03 ENCOUNTER — Ambulatory Visit (INDEPENDENT_AMBULATORY_CARE_PROVIDER_SITE_OTHER): Payer: Medicare Other | Admitting: Cardiothoracic Surgery

## 2013-01-03 ENCOUNTER — Other Ambulatory Visit: Payer: Self-pay | Admitting: *Deleted

## 2013-01-03 ENCOUNTER — Ambulatory Visit
Admission: RE | Admit: 2013-01-03 | Discharge: 2013-01-03 | Disposition: A | Discharge status: Home / Self Care | Payer: Medicare Other | Source: Ambulatory Visit | Attending: Cardiothoracic Surgery | Admitting: Cardiothoracic Surgery

## 2013-01-03 DIAGNOSIS — Z09 Encounter for follow-up examination after completed treatment for conditions other than malignant neoplasm: Secondary | ICD-10-CM

## 2013-01-03 DIAGNOSIS — C3410 Malignant neoplasm of upper lobe, unspecified bronchus or lung: Secondary | ICD-10-CM

## 2013-01-03 NOTE — Progress Notes (Signed)
PCP is FRIED, [name], MD Referring Provider is Clance, Maree Krabbe, MD  Chief Complaint  Patient presents with  . 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 Patient finishing a multi-cycle course of chemotherapy directed by Dr. Arbutus Ped. Patient still smoking one half to one pack of cigarettes daily. Minimal postthoracotomy pain complaints. Patient has some complaints of dizziness and has had a previous carotid endarterectomy. He was directed to contact his vascular surgeon Dr. 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   Past Medical History  Diagnosis Date  . Arthritis   . Wheezing   . Sore throat   . Carotid artery occlusion   . COPD (chronic obstructive pulmonary disease)   . Lumbar disc disease   . Restless leg syndrome   . Allergic rhinitis   . Hypertension   . Anxiety     anxiety  . Shortness of breath   . Lung mass   . Pneumonia     ? now  . GERD (gastroesophageal reflux disease)   . H/O hiatal hernia   . Cancer     lung mass  . Pre-operative cardiovascular examination   . Nonspecific abnormal unspecified cardiovascular function study   . Peripheral vascular disease, unspecified     Past Surgical History  Procedure Laterality Date  . Carotid endarterectomy  10/15/2010    left  . Hand surgery      repair of the left long, ring, and small finger after a saw injury  . Video bronchoscopy  07/24/2012    Procedure: VIDEO BRONCHOSCOPY WITH FLUORO;  Surgeon: Barbaraann Share, MD;  Location: [facility];  Service: Cardiopulmonary;  Laterality: Bilateral;  . Lobectomy Right 09/14/2012    Procedure: LOBECTOMY;  Surgeon: Kerin Perna, MD;  Location: [facility] OR;  Service: Thoracic;  Laterality: Right;  RIGHT UPPER LOBECTOMY  . Video assisted thoracoscopy Right 09/14/2012    Procedure: VIDEO ASSISTED  THORACOSCOPY;  Surgeon: Kerin Perna, MD;  Location: [facility] OR;  Service: Thoracic;  Laterality: Right;    Family History  Problem Relation Age of Onset  . Heart disease Father     MI  . Alcohol abuse Mother   . Stroke Brother   . Heart disease Brother     Social History History  Substance Use Topics  . Smoking status: Current Every Day Smoker -- 1.00 packs/day for 55 years    Types: Cigarettes  . Smokeless tobacco: Former Neurosurgeon    Quit date: 04/19/1952     Comment: CURRENTLY SMOKING 1/2 PPD  . Alcohol Use: 4.2 oz/week    7 Cans of beer per week    Current Outpatient Prescriptions  Medication Sig Dispense Refill  . albuterol (PROAIR HFA) 108 (90 BASE) MCG/ACT inhaler Inhale 2 puffs into the lungs every 6 (six) hours as needed for shortness of breath.  1 Inhaler  0  . aspirin EC 81 MG tablet Take 81 mg by mouth daily.      . clonazePAM (KLONOPIN) 0.5 MG tablet Take 0.5 mg by mouth 2 (two) times daily as needed for anxiety.      Marland Kitchen HYDROcodone-acetaminophen (NORCO) 7.5-325 MG per tablet Take 1 tablet by mouth every 4 (four) hours as needed for pain.  40 tablet  0  . isosorbide mononitrate (IMDUR) 30 MG 24 hr  tablet Take 30 mg by mouth daily.      Marland Kitchen loratadine (CLARITIN) 10 MG tablet Take 10 mg by mouth daily.      Marland Kitchen omeprazole (PRILOSEC) 40 MG capsule Take 40 mg by mouth daily.      . pramipexole (MIRAPEX) 0.5 MG tablet Take 1-1.5 mg by mouth at bedtime as needed. Restless legs      . temazepam (RESTORIL) 30 MG capsule Take 1 capsule (30 mg total) by mouth at bedtime as needed for sleep.  30 capsule  0   No current facility-administered medications for this visit.    No Known Allergies  Review of Systems patient feels somewhat tired with some neuropathy symptoms of his legs and some loss of muscle tone in his abdomen but no weight loss  BP 109/85  Pulse 70  Resp 16  Ht 5\' 9"  (1.753 m)  Wt 166 lb (75.297 kg)  BMI 24.5 kg/m2  SpO2 98% Physical Exam Alert chronically  ill Neck without adenopathy or mass Breath sounds distant bilaterally Thoracotomy incision well-healed Cardiac rhythm regular  Diagnostic Tests: Chest x-ray clear  Impression: Stable course 3 months after right upper lobectomy. Patient still smoking and this was discussed clearly that he should completely stop. No change in medications.  Plan: Return for CT scan of chest 6 months following lobectomy for surveillance.

## 2013-01-04 ENCOUNTER — Other Ambulatory Visit: Payer: Medicare Other

## 2013-01-04 ENCOUNTER — Ambulatory Visit (HOSPITAL_BASED_OUTPATIENT_CLINIC_OR_DEPARTMENT_OTHER): Payer: Medicare Other

## 2013-01-04 ENCOUNTER — Encounter: Payer: Self-pay | Admitting: Internal Medicine

## 2013-01-04 ENCOUNTER — Telehealth: Payer: Self-pay | Admitting: Internal Medicine

## 2013-01-04 ENCOUNTER — Ambulatory Visit (HOSPITAL_BASED_OUTPATIENT_CLINIC_OR_DEPARTMENT_OTHER): Payer: Medicare Other | Admitting: Internal Medicine

## 2013-01-04 ENCOUNTER — Other Ambulatory Visit: Payer: Self-pay | Admitting: Medical Oncology

## 2013-01-04 ENCOUNTER — Other Ambulatory Visit (HOSPITAL_BASED_OUTPATIENT_CLINIC_OR_DEPARTMENT_OTHER): Payer: Medicare Other | Admitting: Lab

## 2013-01-04 DIAGNOSIS — C3410 Malignant neoplasm of upper lobe, unspecified bronchus or lung: Secondary | ICD-10-CM

## 2013-01-04 DIAGNOSIS — Z5111 Encounter for antineoplastic chemotherapy: Secondary | ICD-10-CM

## 2013-01-04 LAB — CBC WITH DIFFERENTIAL/PLATELET
Basophils Absolute: 0.1 10*3/uL (ref 0.0–0.1)
Eosinophils Absolute: 0 10*3/uL (ref 0.0–0.5)
HCT: 36.2 % — ABNORMAL LOW (ref 38.4–49.9)
HGB: 12.1 g/dL — ABNORMAL LOW (ref 13.0–17.1)
LYMPH%: 33.7 % (ref 14.0–49.0)
MCHC: 33.4 g/dL (ref 32.0–36.0)
MONO#: 0.7 10*3/uL (ref 0.1–0.9)
NEUT#: 3 10*3/uL (ref 1.5–6.5)
NEUT%: 53 % (ref 39.0–75.0)
Platelets: 370 10*3/uL (ref 140–400)
WBC: 5.8 10*3/uL (ref 4.0–10.3)
lymph#: 1.9 10*3/uL (ref 0.9–3.3)

## 2013-01-04 LAB — COMPREHENSIVE METABOLIC PANEL (CC13)
ALT: 11 U/L (ref 0–55)
AST: 16 U/L (ref 5–34)
Alkaline Phosphatase: 111 U/L (ref 40–150)
Calcium: 9.4 mg/dL (ref 8.4–10.4)
Chloride: 103 mEq/L (ref 98–109)
Creatinine: 0.7 mg/dL (ref 0.7–1.3)
Potassium: 4 mEq/L (ref 3.5–5.1)

## 2013-01-04 MED ORDER — ONDANSETRON 16 MG/50ML IVPB (CHCC)
16.0000 mg | Freq: Once | INTRAVENOUS | Status: AC
  Administered 2013-01-04: 16 mg via INTRAVENOUS

## 2013-01-04 MED ORDER — HYDROCODONE-ACETAMINOPHEN 7.5-325 MG PO TABS: 1.0000 | ORAL_TABLET | ORAL | Status: DC | PRN

## 2013-01-04 MED ORDER — DEXAMETHASONE SODIUM PHOSPHATE 20 MG/5ML IJ SOLN
20.0000 mg | Freq: Once | INTRAMUSCULAR | Status: AC
  Administered 2013-01-04: 20 mg via INTRAVENOUS

## 2013-01-04 MED ORDER — DIPHENHYDRAMINE HCL 50 MG/ML IJ SOLN
50.0000 mg | Freq: Once | INTRAMUSCULAR | Status: AC
  Administered 2013-01-04: 50 mg via INTRAVENOUS

## 2013-01-04 MED ORDER — PACLITAXEL CHEMO INJECTION 300 MG/50ML
175.0000 mg/m2 | Freq: Once | INTRAVENOUS | Status: AC
  Administered 2013-01-04: 336 mg via INTRAVENOUS
  Filled 2013-01-04: qty 56

## 2013-01-04 MED ORDER — SODIUM CHLORIDE 0.9 % IV SOLN
Freq: Once | INTRAVENOUS | Status: AC
  Administered 2013-01-04: 09:00:00 via INTRAVENOUS

## 2013-01-04 MED ORDER — CARBOPLATIN CHEMO INJECTION 600 MG/60ML
580.0000 mg | Freq: Once | INTRAVENOUS | Status: AC
  Administered 2013-01-04: 580 mg via INTRAVENOUS
  Filled 2013-01-04: qty 58

## 2013-01-04 MED ORDER — FAMOTIDINE IN NACL 20-0.9 MG/50ML-% IV SOLN
20.0000 mg | Freq: Once | INTRAVENOUS | Status: AC
  Administered 2013-01-04: 20 mg via INTRAVENOUS

## 2013-01-04 NOTE — Telephone Encounter (Signed)
rx given to pt

## 2013-01-04 NOTE — Progress Notes (Signed)
[facility] [phone]   Fax:([phone]  OFFICE PROGRESS NOTE  [name], MD [address] [place] [address]  DIAGNOSIS: Stage IIA (T2 B., N0, M0) non-small cell lung cancer, squamous cell carcinoma diagnosed in December of 2013   PRIOR THERAPY: Status post right upper lobectomy under the care of Dr. Donata Clay on 09/14/2012.   CURRENT THERAPY: Adjuvant chemotherapy with cisplatin 75 mg/M2 and docetaxel 75 mg/M2 with Neulasta support every 3 weeks, status post 1 cycle. Starting with cycle #2, patient will receive carboplatin for an AUC initially of 4.5 and paclitaxel at 175 mg per meter squared with Neulasta support given every 3 weeks. Status post a total of 2 cycles   INTERVAL HISTORY: [name] 72 y.o. male returns to the clinic today for followup visit accompanied his wife. The patient related the last cycle of his systemic chemotherapy fairly well with no significant adverse effects. The patient denied having any chest pain, shortness breath, cough or hemoptysis. He has no nausea or vomiting. He denied having any significant fatigue and weakness. He is here today to start cycle #3 of his chemotherapy.  MEDICAL HISTORY: Past Medical History  Diagnosis Date  . Arthritis   . Wheezing   . Sore throat   . Carotid artery occlusion   . COPD (chronic obstructive pulmonary disease)   . Lumbar disc disease   . Restless leg syndrome   . Allergic rhinitis   . Hypertension   . Anxiety     anxiety  . Shortness of breath   . Lung mass   . Pneumonia     ? now  . GERD (gastroesophageal reflux disease)   . H/O hiatal hernia   . Cancer     lung mass  . Pre-operative cardiovascular examination   . Nonspecific abnormal unspecified cardiovascular function study   . Peripheral vascular disease, unspecified     ALLERGIES:  has No Known Allergies.  MEDICATIONS:  Current Outpatient Prescriptions  Medication Sig Dispense Refill  . aspirin EC 81 MG  tablet Take 81 mg by mouth daily.      . clonazePAM (KLONOPIN) 0.5 MG tablet Take 0.5 mg by mouth 2 (two) times daily as needed for anxiety.      . isosorbide mononitrate (IMDUR) 30 MG 24 hr tablet Take 30 mg by mouth daily.      Marland Kitchen loratadine (CLARITIN) 10 MG tablet Take 10 mg by mouth daily.      Marland Kitchen omeprazole (PRILOSEC) 40 MG capsule Take 40 mg by mouth daily.      . pramipexole (MIRAPEX) 0.5 MG tablet Take 1-1.5 mg by mouth at bedtime as needed. Restless legs      . temazepam (RESTORIL) 30 MG capsule Take 1 capsule (30 mg total) by mouth at bedtime as needed for sleep.  30 capsule  0  . albuterol (PROAIR HFA) 108 (90 BASE) MCG/ACT inhaler Inhale 2 puffs into the lungs every 6 (six) hours as needed for shortness of breath.  1 Inhaler  0  . HYDROcodone-acetaminophen (NORCO) 7.5-325 MG per tablet Take 1 tablet by mouth every 4 (four) hours as needed for pain.  40 tablet  0   No current facility-administered medications for this visit.    SURGICAL HISTORY:  Past Surgical History  Procedure Laterality Date  . Carotid endarterectomy  10/15/2010    left  . Hand surgery      repair of the left long, ring, and small  finger after a saw injury  . Video bronchoscopy  07/24/2012    Procedure: VIDEO BRONCHOSCOPY WITH FLUORO;  Surgeon: Barbaraann Share, MD;  Location: [facility];  Service: Cardiopulmonary;  Laterality: Bilateral;  . Lobectomy Right 09/14/2012    Procedure: LOBECTOMY;  Surgeon: Kerin Perna, MD;  Location: [facility] OR;  Service: Thoracic;  Laterality: Right;  RIGHT UPPER LOBECTOMY  . Video assisted thoracoscopy Right 09/14/2012    Procedure: VIDEO ASSISTED THORACOSCOPY;  Surgeon: Kerin Perna, MD;  Location: [facility] OR;  Service: Thoracic;  Laterality: Right;    REVIEW OF SYSTEMS:  A comprehensive review of systems was negative.   PHYSICAL EXAMINATION: General appearance: alert, cooperative and no distress Head: Normocephalic, without obvious abnormality, atraumatic Neck: no adenopathy Lymph  nodes: Cervical, supraclavicular, and axillary nodes normal. Resp: clear to auscultation bilaterally Cardio: regular rate and rhythm, S1, S2 normal, no murmur, click, rub or gallop GI: soft, non-tender; bowel sounds normal; no masses,  no organomegaly Extremities: extremities normal, atraumatic, no cyanosis or edema  ECOG PERFORMANCE STATUS: 1 - Symptomatic but completely ambulatory  Blood pressure 134/98, pulse 93, temperature 98.3 F (36.8 C), temperature source Oral, resp. rate 18, height 5\' 9"  (1.753 m), weight 162 lb 4.8 oz (73.619 kg), SpO2 100.00%.  LABORATORY DATA: Lab Results  Component Value Date   WBC 5.8 01/04/2013   HGB 12.1* 01/04/2013   HCT 36.2* 01/04/2013   MCV 91.6 01/04/2013   PLT 370 01/04/2013      Chemistry      Component Value Date/Time   NA 133* 12/28/2012 0852   NA 134* 09/19/2012 0356   K 4.3 12/28/2012 0852   K 3.3* 09/19/2012 0356   CL 101 12/28/2012 0852   CL 96 09/19/2012 0356   CO2 23 12/28/2012 0852   CO2 32 09/19/2012 0356   BUN 9.7 12/28/2012 0852   BUN 9 09/19/2012 0356   CREATININE 0.7 12/28/2012 0852   CREATININE 0.65 09/19/2012 0356      Component Value Date/Time   CALCIUM 9.2 12/28/2012 0852   CALCIUM 8.9 09/19/2012 0356   ALKPHOS 133 12/28/2012 0852   ALKPHOS 80 09/16/2012 0420   AST 16 12/28/2012 0852   AST 35 09/16/2012 0420   ALT 11 12/28/2012 0852   ALT 19 09/16/2012 0420   BILITOT 0.25 12/28/2012 0852   BILITOT 0.4 09/16/2012 0420       RADIOGRAPHIC STUDIES: Dg Chest 2 View  01/03/2013   *RADIOLOGY REPORT*  Clinical Data: Post right VATS and right upper lobectomy for adenocarcinoma of the right upper lobe  CHEST - 2 VIEW  Comparison: Chest x-ray of 10/11/2012 and CT chest of 07/10/2012  Findings: Changes of right upper lobectomy are noted with scarring in the right lung apex and retraction of the hilum cephalad.  There is volume loss at the right lung base.  No evidence of recurrence of lung carcinoma is seen.  The lungs are hyperaerated indicating  emphysema.  Mediastinal contours are stable.  The heart is within normal limits in size.  No bony abnormality is seen.  Old right lateral rib fractures are stable.  IMPRESSION: Stable postoperative changes on the right with scarring in the right apex and at the right lung base.  No evidence of recurrence of lung carcinoma.   Original Report Authenticated By: Dwyane Dee, M.D.    ASSESSMENT AND PLAN: This is a very pleasant 72 years old white male with stage II a non-small cell lung cancer status post right upper lobectomy  and currently undergoing adjuvant chemotherapy therapy status post a total of 3 cycles so far including 2 cycles of carboplatin and paclitaxel. He is tolerating his current treatment was carboplatin and paclitaxel fairly well. We'll proceed with the third and last cycle of this treatment today as scheduled. The patient would come back for followup visit in one month with repeat CT scan of the chest for restaging of his disease after completion of the adjuvant therapy. He was advised to call immediately if he has any concerning symptoms in the interval.  All questions were answered. The patient knows to call the clinic with any problems, questions or concerns. We can certainly see the patient much sooner if necessary.

## 2013-01-04 NOTE — Patient Instructions (Addendum)
[facility] Discharge Instructions for Patients Receiving Chemotherapy  Today you received the following chemotherapy agents: Taxol and Carboplatin.  To help prevent nausea and vomiting after your treatment, we encourage you to take your nausea medication as prescribed.   If you develop nausea and vomiting that is not controlled by your nausea medication, call the clinic. If it is after clinic hours your family physician or the after hours number for the clinic or go to the Emergency Department.   BELOW ARE SYMPTOMS THAT SHOULD BE REPORTED IMMEDIATELY:  *FEVER GREATER THAN 100.5 F  *CHILLS WITH OR WITHOUT FEVER  NAUSEA AND VOMITING THAT IS NOT CONTROLLED WITH YOUR NAUSEA MEDICATION  *UNUSUAL SHORTNESS OF BREATH  *UNUSUAL BRUISING OR BLEEDING  TENDERNESS IN MOUTH AND THROAT WITH OR WITHOUT PRESENCE OF ULCERS  *URINARY PROBLEMS  *BOWEL PROBLEMS  UNUSUAL RASH Items with * indicate a potential emergency and should be followed up as soon as possible.  Feel free to call the clinic you have any questions or concerns. The clinic phone number is [phone].   I have been informed and understand all the instructions given to me. I know to contact the clinic, my physician, or go to the Emergency Department if any problems should occur. I do not have any questions at this time, but understand that I may call the clinic during office hours   should I have any questions or need assistance in obtaining follow up care.    __________________________________________  _____________  __________ Signature of Patient or Authorized Representative            Date                   Time    __________________________________________ Nurse's Signature

## 2013-01-04 NOTE — Telephone Encounter (Signed)
gv and printed appt sched and avs for pt  °

## 2013-01-05 ENCOUNTER — Ambulatory Visit (HOSPITAL_BASED_OUTPATIENT_CLINIC_OR_DEPARTMENT_OTHER): Payer: Medicare Other

## 2013-01-05 DIAGNOSIS — C3410 Malignant neoplasm of upper lobe, unspecified bronchus or lung: Secondary | ICD-10-CM

## 2013-01-05 MED ORDER — PEGFILGRASTIM INJECTION 6 MG/0.6ML
6.0000 mg | Freq: Once | SUBCUTANEOUS | Status: AC
  Administered 2013-01-05: 6 mg via SUBCUTANEOUS
  Filled 2013-01-05: qty 0.6

## 2013-01-05 NOTE — Patient Instructions (Signed)
Continue adjuvant chemotherapy today as scheduled.  Followup visit in one month with repeat CT scan of the chest.

## 2013-01-11 ENCOUNTER — Other Ambulatory Visit: Payer: Medicare Other

## 2013-01-13 ENCOUNTER — Ambulatory Visit: Payer: Medicare Other

## 2013-01-18 ENCOUNTER — Telehealth: Payer: Self-pay | Admitting: Medical Oncology

## 2013-01-18 NOTE — Telephone Encounter (Signed)
Persistent left hip and leg pain that started 6/29, 2 days after neulasta.This happened after last Neulasta but did not last this long. Hydrocodone helps some as does staying off this leg. He denies injury or fall. Should it last this long? Dr Arbutus Ped notified.

## 2013-01-19 ENCOUNTER — Telehealth: Payer: Self-pay | Admitting: Medical Oncology

## 2013-01-19 NOTE — Telephone Encounter (Signed)
Message copied by Charma Igo on Fri Jan 19, 2013 12:07 PM ------      Message from: Si Gaul      Created: Thu Jan 18, 2013  5:07 PM       Motrin if needed      ----- Message -----         From: Charma Igo, RN         Sent: 01/18/2013   4:35 PM           To: Si Gaul, MD            Persistent left hip and leg pain that started 6/29, 2 days after neulasta.This happened after last Neulasta but did not last this long. Hydrocodone helps some as does staying off this leg. He denies injury or fall. Should it last this long?      Any other action?       ------

## 2013-01-19 NOTE — Telephone Encounter (Signed)
I left a message for pt to take Motrin.

## 2013-02-05 ENCOUNTER — Ambulatory Visit (HOSPITAL_COMMUNITY)
Admission: RE | Admit: 2013-02-05 | Discharge: 2013-02-05 | Disposition: A | Discharge status: Home / Self Care | Payer: Medicare Other | Source: Ambulatory Visit | Attending: Internal Medicine | Admitting: Internal Medicine

## 2013-02-05 ENCOUNTER — Other Ambulatory Visit (HOSPITAL_BASED_OUTPATIENT_CLINIC_OR_DEPARTMENT_OTHER): Payer: Medicare Other | Admitting: Lab

## 2013-02-05 DIAGNOSIS — C3490 Malignant neoplasm of unspecified part of unspecified bronchus or lung: Secondary | ICD-10-CM | POA: Insufficient documentation

## 2013-02-05 DIAGNOSIS — Z9221 Personal history of antineoplastic chemotherapy: Secondary | ICD-10-CM | POA: Insufficient documentation

## 2013-02-05 DIAGNOSIS — I2510 Atherosclerotic heart disease of native coronary artery without angina pectoris: Secondary | ICD-10-CM | POA: Insufficient documentation

## 2013-02-05 LAB — CBC WITH DIFFERENTIAL/PLATELET
BASO%: 0.4 % (ref 0.0–2.0)
Basophils Absolute: 0 10*3/uL (ref 0.0–0.1)
EOS%: 0.8 % (ref 0.0–7.0)
HCT: 37.1 % — ABNORMAL LOW (ref 38.4–49.9)
HGB: 12.1 g/dL — ABNORMAL LOW (ref 13.0–17.1)
LYMPH%: 24 % (ref 14.0–49.0)
MCH: 32 pg (ref 27.2–33.4)
MCHC: 32.6 g/dL (ref 32.0–36.0)
NEUT%: 69.3 % (ref 39.0–75.0)
Platelets: 263 10*3/uL (ref 140–400)

## 2013-02-05 LAB — COMPREHENSIVE METABOLIC PANEL (CC13)
ALT: 12 U/L (ref 0–55)
AST: 19 U/L (ref 5–34)
BUN: 10.6 mg/dL (ref 7.0–26.0)
CO2: 27 mEq/L (ref 22–29)
Calcium: 9.1 mg/dL (ref 8.4–10.4)
Creatinine: 0.8 mg/dL (ref 0.7–1.3)
Total Bilirubin: 0.36 mg/dL (ref 0.20–1.20)

## 2013-02-05 MED ORDER — IOHEXOL 300 MG/ML  SOLN
80.0000 mL | Freq: Once | INTRAMUSCULAR | Status: AC | PRN
  Administered 2013-02-05: 80 mL via INTRAVENOUS

## 2013-02-06 ENCOUNTER — Other Ambulatory Visit: Payer: Medicare Other | Admitting: Lab

## 2013-02-07 ENCOUNTER — Ambulatory Visit (HOSPITAL_BASED_OUTPATIENT_CLINIC_OR_DEPARTMENT_OTHER): Payer: Medicare Other | Admitting: Internal Medicine

## 2013-02-07 ENCOUNTER — Encounter: Payer: Self-pay | Admitting: Internal Medicine

## 2013-02-07 ENCOUNTER — Telehealth: Payer: Self-pay | Admitting: Internal Medicine

## 2013-02-07 VITALS — BP 111/90 | HR 103 | Temp 98.9°F | Resp 20 | Ht 69.0 in | Wt 159.5 lb

## 2013-02-07 DIAGNOSIS — C3490 Malignant neoplasm of unspecified part of unspecified bronchus or lung: Secondary | ICD-10-CM

## 2013-02-07 DIAGNOSIS — G4700 Insomnia, unspecified: Secondary | ICD-10-CM

## 2013-02-07 MED ORDER — HYDROCODONE-ACETAMINOPHEN 7.5-325 MG PO TABS: 1.0000 | ORAL_TABLET | ORAL | Status: DC | PRN

## 2013-02-07 MED ORDER — TEMAZEPAM 30 MG PO CAPS: 30.0000 mg | ORAL_CAPSULE | Freq: Every evening | ORAL | Status: DC | PRN

## 2013-02-07 NOTE — Telephone Encounter (Signed)
gave pt appt for lab and MD on october 2014

## 2013-02-07 NOTE — Progress Notes (Signed)
[facility] [phone]) [phone]   Fax:(336) [phone]  OFFICE PROGRESS NOTE  FRIED, [name], MD [address] [place] [address]  DIAGNOSIS: Stage IIA (T2 B., N0, M0) non-small cell lung cancer, squamous cell carcinoma diagnosed in December of 2013   PRIOR THERAPY:  1) Status post right upper lobectomy under the care of Dr. Donata Clay on 09/14/2012.  2) Adjuvant chemotherapy with cisplatin 75 mg/M2 and docetaxel 75 mg/M2 with Neulasta support every 3 weeks, status post 1 cycle. Starting with cycle #2, patient will receive carboplatin for an AUC initially of 4.5 and paclitaxel at 175 mg per meter squared with Neulasta support given every 3 weeks. Status post a total of 3 cycles.  CURRENT THERAPY: Observation.  CHEMOTHERAPY INTENT: Curative  CURRENT # OF CHEMOTHERAPY CYCLES: 4  CURRENT ANTIEMETICS: Zofran, dexamethasone and Compazine  CURRENT SMOKING STATUS: Current smoker and was strongly advised to quit smoking and altered smoke cessation program.  ORAL CHEMOTHERAPY AND CONSENT: None  CURRENT BISPHOSPHONATES USE: None  PAIN MANAGEMENT: 3/10 controlled by Norco when necessary  NARCOTICS INDUCED CONSTIPATION: Milk of magnesia on as-needed basis.  LIVING WILL AND CODE STATUS: No CODE BLUE.   INTERVAL HISTORY: [name] 72 y.o. male returns to the clinic today for followup visit accompanied his wife. The patient tolerated the last cycle of his systemic chemotherapy fairly well with no significant adverse effects. He denied having any significant nausea or vomiting. He denied having any chest pain, shortness breath, cough or hemoptysis. 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 The patient had repeat CT scan of the chest performed recently and he is here for evaluation and discussion of his scan results.    MEDICAL HISTORY: Past Medical History  Diagnosis Date  . Arthritis   . Wheezing   . Sore throat   . Carotid artery occlusion   . COPD (chronic  obstructive pulmonary disease)   . Lumbar disc disease   . Restless leg syndrome   . Allergic rhinitis   . Hypertension   . Anxiety     anxiety  . Shortness of breath   . Lung mass   . Pneumonia     ? now  . GERD (gastroesophageal reflux disease)   . H/O hiatal hernia   . Cancer     lung mass  . Pre-operative cardiovascular examination   . Nonspecific abnormal unspecified cardiovascular function study   . Peripheral vascular disease, unspecified     ALLERGIES:  has No Known Allergies.  MEDICATIONS:  Current Outpatient Prescriptions  Medication Sig Dispense Refill  . aspirin EC 81 MG tablet Take 81 mg by mouth daily.      . clonazePAM (KLONOPIN) 0.5 MG tablet Take 0.5 mg by mouth 2 (two) times daily as needed for anxiety.      Marland Kitchen HYDROcodone-acetaminophen (NORCO) 7.5-325 MG per tablet Take 1 tablet by mouth every 4 (four) hours as needed for pain.  40 tablet  0  . isosorbide mononitrate (IMDUR) 30 MG 24 hr tablet Take 30 mg by mouth daily.      Marland Kitchen omeprazole (PRILOSEC) 40 MG capsule Take 40 mg by mouth daily.      . pramipexole (MIRAPEX) 0.5 MG tablet Take 1-1.5 mg by mouth at bedtime as needed. Restless legs      . temazepam (RESTORIL) 30 MG capsule Take 1 capsule (30 mg total) by mouth at bedtime as needed for sleep.  30 capsule  0   No current  facility-administered medications for this visit.    SURGICAL HISTORY:  Past Surgical History  Procedure Laterality Date  . Carotid endarterectomy  10/15/2010    left  . Hand surgery      repair of the left long, ring, and small finger after a saw injury  . Video bronchoscopy  07/24/2012    Procedure: VIDEO BRONCHOSCOPY WITH FLUORO;  Surgeon: Barbaraann Share, MD;  Location: [facility];  Service: Cardiopulmonary;  Laterality: Bilateral;  . Lobectomy Right 09/14/2012    Procedure: LOBECTOMY;  Surgeon: Kerin Perna, MD;  Location: [facility] OR;  Service: Thoracic;  Laterality: Right;  RIGHT UPPER LOBECTOMY  . Video assisted thoracoscopy  Right 09/14/2012    Procedure: VIDEO ASSISTED THORACOSCOPY;  Surgeon: Kerin Perna, MD;  Location: [facility] OR;  Service: Thoracic;  Laterality: Right;    REVIEW OF SYSTEMS:  A comprehensive review of systems was negative except for: Constitutional: positive for fatigue Respiratory: positive for dyspnea on exertion   PHYSICAL EXAMINATION: General appearance: alert, cooperative, fatigued and no distress Head: Normocephalic, without obvious abnormality, atraumatic Neck: no adenopathy Lymph nodes: Cervical, supraclavicular, and axillary nodes normal. Resp: clear to auscultation bilaterally Cardio: regular rate and rhythm, S1, S2 normal, no murmur, click, rub or gallop GI: soft, non-tender; bowel sounds normal; no masses,  no organomegaly Extremities: extremities normal, atraumatic, no cyanosis or edema Neurologic: Alert and oriented X 3, normal strength and tone. Normal symmetric reflexes. Normal coordination and gait  ECOG PERFORMANCE STATUS: 1 - Symptomatic but completely ambulatory  Blood pressure 111/90, pulse 103, temperature 98.9 F (37.2 C), temperature source Oral, resp. rate 20, height 5\' 9"  (1.753 m), weight 159 lb 8 oz (72.349 kg).  LABORATORY DATA: Lab Results  Component Value Date   WBC 8.3 02/05/2013   HGB 12.1* 02/05/2013   HCT 37.1* 02/05/2013   MCV 98.1* 02/05/2013   PLT 263 02/05/2013      Chemistry      Component Value Date/Time   NA 139 02/05/2013 0750   NA 134* 09/19/2012 0356   K 3.7 02/05/2013 0750   K 3.3* 09/19/2012 0356   CL 101 12/28/2012 0852   CL 96 09/19/2012 0356   CO2 27 02/05/2013 0750   CO2 32 09/19/2012 0356   BUN 10.6 02/05/2013 0750   BUN 9 09/19/2012 0356   CREATININE 0.8 02/05/2013 0750   CREATININE 0.65 09/19/2012 0356      Component Value Date/Time   CALCIUM 9.1 02/05/2013 0750   CALCIUM 8.9 09/19/2012 0356   ALKPHOS 87 02/05/2013 0750   ALKPHOS 80 09/16/2012 0420   AST 19 02/05/2013 0750   AST 35 09/16/2012 0420   ALT 12 02/05/2013 0750   ALT 19  09/16/2012 0420   BILITOT 0.36 02/05/2013 0750   BILITOT 0.4 09/16/2012 0420       RADIOGRAPHIC STUDIES: Ct Chest W Contrast  02/05/2013   *RADIOLOGY REPORT*  Clinical Data: Lung cancer staging, chemotherapy complete.  History partial right lung resection, short of breath  CT CHEST WITH CONTRAST  Technique:  Multidetector CT imaging of the chest was performed following the standard protocol during bolus administration of intravenous contrast.  Contrast: 80mL OMNIPAQUE IOHEXOL 300 MG/ML  SOLN  Comparison: PET CT scan 07/19/2012  Findings: The the partial right upper lobe lung resection.  The large multi lobular right upper lobe mass has been resected.  There is now linear scarring and volume loss in the right upper lobe.  No measurable nodularity.  The left lung is  clear.  No mediastinal lymphadenopathy.  No hilar lymphadenopathy.  No supraclavicular adenopathy.  No pericardial fluid.  Coronary calcifications are present. Esophagus is normal.  Limited view of the upper abdomen shows normal adrenal glands. Limited view of the liver is unremarkable.  There is a thoracotomy defect along the right lateral chest wall. Degenerative osteophytosis of the thoracic spine.  IMPRESSION:  1.  Interval resection of the right upper lobe bilobed mass. 2.  No evidence of lung cancer recurrence or metastasis in the thorax. 3.  Postsurgical change along the right chest wall.   Original Report Authenticated By: Genevive Bi, M.D.    ASSESSMENT AND PLAN: This is a very pleasant 72 years old white male with history of stage IIa non-small cell lung cancer status post right upper lobectomy followed by 4 cycles of adjuvant chemotherapy. The patient tolerated his chemotherapy fairly well with no significant adverse effects except for the nausea or vomiting as well as fatigue with the first cycle of treatment which included cisplatin. He tolerated the last 3 cycles with carboplatin and paclitaxel much better. CT scan of the chest  showed no evidence for disease recurrence.  I discussed the scan results with the patient and his wife.  I recommended for him to continue on observation with repeat CT scan of the chest in 3 months. He was given a refill for Norco and Restoril. He was advised to call immediately if he has any concerning symptoms in the interval. For smoke cessation, I strongly advise the patient to quit smoking and offered him to smoke cessation program but he mentions that he would like to work on quiting by himself. The patient voices understanding of current disease status and treatment options and is in agreement with the current care plan.  All questions were answered. The patient knows to call the clinic with any problems, questions or concerns. We can certainly see the patient much sooner if necessary.  I spent 15 minutes counseling the patient face to face. The total time spent in the appointment was 25 minutes.

## 2013-02-07 NOTE — Patient Instructions (Signed)
No evidence for disease recurrence on the recent scan. Followup visit in 3 months with repeat CT scan of the chest.

## 2013-02-14 ENCOUNTER — Other Ambulatory Visit: Payer: Self-pay

## 2013-03-13 ENCOUNTER — Other Ambulatory Visit: Payer: Self-pay

## 2013-03-13 DIAGNOSIS — C3490 Malignant neoplasm of unspecified part of unspecified bronchus or lung: Secondary | ICD-10-CM

## 2013-03-28 ENCOUNTER — Other Ambulatory Visit: Payer: Self-pay | Admitting: *Deleted

## 2013-03-28 DIAGNOSIS — M79609 Pain in unspecified limb: Secondary | ICD-10-CM

## 2013-04-17 ENCOUNTER — Ambulatory Visit: Payer: Medicare Other | Admitting: Thoracic Surgery (Cardiothoracic Vascular Surgery)

## 2013-04-17 ENCOUNTER — Encounter: Payer: Self-pay | Admitting: Vascular Surgery

## 2013-04-18 ENCOUNTER — Ambulatory Visit (INDEPENDENT_AMBULATORY_CARE_PROVIDER_SITE_OTHER): Payer: Medicare Other | Admitting: Vascular Surgery

## 2013-04-18 ENCOUNTER — Ambulatory Visit (INDEPENDENT_AMBULATORY_CARE_PROVIDER_SITE_OTHER)
Admission: RE | Admit: 2013-04-18 | Discharge: 2013-04-18 | Disposition: A | Discharge status: Home / Self Care | Payer: Medicare Other | Source: Ambulatory Visit | Attending: Vascular Surgery | Admitting: Vascular Surgery

## 2013-04-18 ENCOUNTER — Encounter: Payer: Self-pay | Admitting: Vascular Surgery

## 2013-04-18 ENCOUNTER — Ambulatory Visit (HOSPITAL_COMMUNITY)
Admission: RE | Admit: 2013-04-18 | Discharge: 2013-04-18 | Disposition: A | Discharge status: Home / Self Care | Payer: Medicare Other | Source: Ambulatory Visit | Attending: Vascular Surgery | Admitting: Vascular Surgery

## 2013-04-18 VITALS — BP 116/85 | HR 84 | Ht 69.0 in | Wt 159.0 lb

## 2013-04-18 DIAGNOSIS — I739 Peripheral vascular disease, unspecified: Secondary | ICD-10-CM

## 2013-04-18 DIAGNOSIS — M79609 Pain in unspecified limb: Secondary | ICD-10-CM

## 2013-04-18 DIAGNOSIS — Z48812 Encounter for surgical aftercare following surgery on the circulatory system: Secondary | ICD-10-CM | POA: Insufficient documentation

## 2013-04-18 DIAGNOSIS — I6529 Occlusion and stenosis of unspecified carotid artery: Secondary | ICD-10-CM | POA: Insufficient documentation

## 2013-04-18 NOTE — Progress Notes (Signed)
[facility]  Patient name: [name] MRN: [id number] DOB: [birth year] Sex: male  REASON FOR VISIT: Leg pain. Referred by Dr. Foy Guadalajara  HPI: [name] is a 72 y.o. male who I been following with carotid disease. He has undergone previous left carotid endarterectomy and has a known moderate right carotid stenosis. He's been having some pain in his left hip which radiates down his left leg. This began when he began chemotherapy for lung cancer. He completed 4 weeks of chemotherapy. The pain involves his hip and radiates down his left leg. This does not appear to be associated specifically with activity. I do not get any history of claudication, rest pain, or nonhealing ulcers. He does occasionally have some swelling in his legs but this has not been significant.  He does continue to smoke one pack per day of cigarettes.    Past Medical History  Diagnosis Date  . Arthritis   . Wheezing   . Sore throat   . Carotid artery occlusion   . COPD (chronic obstructive pulmonary disease)   . Lumbar disc disease   . Restless leg syndrome   . Allergic rhinitis   . Hypertension   . Anxiety     anxiety  . Shortness of breath   . Lung mass   . Pneumonia     ? now  . GERD (gastroesophageal reflux disease)   . H/O hiatal hernia   . Cancer     lung mass  . Pre-operative cardiovascular examination   . Nonspecific abnormal unspecified cardiovascular function study   . Peripheral vascular disease, unspecified    Family History  Problem Relation Age of Onset  . Heart disease Father     MI  . Alcohol abuse Mother   . Stroke Brother   . Heart disease Brother    SOCIAL HISTORY: History  Substance Use Topics  . Smoking status: Current Every Day Smoker -- 1.00 packs/day for 55 years    Types: Cigarettes  . Smokeless tobacco: Former Neurosurgeon    Quit date: 04/19/1952  . Alcohol Use: 4.2 oz/week    7 Cans of beer per week   No Known Allergies Current Outpatient  Prescriptions  Medication Sig Dispense Refill  . aspirin EC 81 MG tablet Take 81 mg by mouth daily.      . clonazePAM (KLONOPIN) 0.5 MG tablet Take 0.5 mg by mouth 2 (two) times daily as needed for anxiety.      Marland Kitchen HYDROcodone-acetaminophen (NORCO) 7.5-325 MG per tablet Take 1 tablet by mouth every 4 (four) hours as needed for pain.  40 tablet  0  . isosorbide mononitrate (IMDUR) 30 MG 24 hr tablet Take 30 mg by mouth daily.      Marland Kitchen omeprazole (PRILOSEC) 40 MG capsule Take 40 mg by mouth daily.      . pramipexole (MIRAPEX) 0.5 MG tablet Take 1-1.5 mg by mouth at bedtime as needed. Restless legs      . temazepam (RESTORIL) 30 MG capsule Take 1 capsule (30 mg total) by mouth at bedtime as needed for sleep.  30 capsule  0   No current facility-administered medications for this visit.   REVIEW OF SYSTEMS: Arly.Keller ] denotes positive finding; [  ] denotes negative finding  CARDIOVASCULAR:  [ ]  chest pain   [ ]  chest pressure   [ ]  palpitations   [ ]  orthopnea   Arly.Keller ] dyspnea on exertion   [ ]  claudication   [ ]   rest pain   [ ]  DVT   [ ]  phlebitis PULMONARY:   [ ]  productive cough   [ ]  asthma   [ ]  wheezing NEUROLOGIC:   [ ]  weakness  [ ]  paresthesias  [ ]  aphasia  [ ]  amaurosis  [ ]  dizziness HEMATOLOGIC:   [ ]  bleeding problems   [ ]  clotting disorders MUSCULOSKELETAL:  [ ]  joint pain   [ ]  joint swelling Arly.Keller ] leg swelling GASTROINTESTINAL: [ ]   blood in stool  [ ]   hematemesis GENITOURINARY:  [ ]   dysuria  [ ]   hematuria PSYCHIATRIC:  [ ]  history of major depression INTEGUMENTARY:  [ ]  rashes  [ ]  ulcers CONSTITUTIONAL:  [ ]  fever   [ ]  chills  PHYSICAL EXAM: Filed Vitals:   04/18/13 1405  BP: 116/85  Pulse: 84  Height: 5\' 9"  (1.753 m)  Weight: 159 lb (72.122 kg)  SpO2: 98%   Body mass index is 23.47 kg/(m^2). GENERAL: The patient is a well-nourished male, in no acute distress. The vital signs are documented above. CARDIOVASCULAR: There is a regular rate and rhythm. He has a right carotid  bruit. He has palpable femoral pulses popliteal pulses and posterior tibial pulses bilaterally. He has a palpable right dorsalis pedis pulse. I cannot palpate a left dorsalis pedis pulse. PULMONARY: There is good air exchange bilaterally without wheezing or rales. ABDOMEN: Soft and non-tender with normal pitched bowel sounds.  MUSCULOSKELETAL: There are no major deformities or cyanosis. NEUROLOGIC: No focal weakness or paresthesias are detected. SKIN: There are no ulcers or rashes noted. He does have some hyperpigmentation bilaterally consistent with chronic venous insufficiency. PSYCHIATRIC: The patient has a normal affect.  DATA:  I have independently interpreted his venous duplex of his left lower extremity. He has no evidence of DVT in the left lower extremity. He does have some reflux in the left common femoral vein and femoral vein in addition to the saphenofemoral junction.  I have independently interpreted his carotid duplex scan which shows no evidence of recurrent carotid stenosis on the left. He has a 60-79% right carotid stenosis.  MEDICAL ISSUES: CAROTID DISEASE: He has a 60-79% asymptomatic right carotid stenosis. I have recommended a carotid duplex in 6 months and we will see him back at that time. The meantime he is to continue taking his aspirin. We have also discussed the importance of tobacco cessation.  LEFT LEG PAIN: He has palpable posterior tibial pulse and the left and no evidence of significant arterial insufficiency. I think his pain may be related to sciatica. He does have some chronic venous insufficiency but I do not think this would explain his symptoms. We have discussed the importance of intermittent leg elevation. I reassured him that he did not have any evidence of significant arterial insufficiency but again discussed with him the importance of tobacco cessation.  Devorah Givhan S [facility] Beeper: [phone]

## 2013-04-18 NOTE — Addendum Note (Signed)
Addended by: Adria Dill L on: 04/18/2013 03:27 PM   Modules accepted: Orders

## 2013-04-20 ENCOUNTER — Other Ambulatory Visit: Payer: Self-pay | Admitting: Vascular Surgery

## 2013-04-20 DIAGNOSIS — I6529 Occlusion and stenosis of unspecified carotid artery: Secondary | ICD-10-CM

## 2013-04-20 DIAGNOSIS — Z48812 Encounter for surgical aftercare following surgery on the circulatory system: Secondary | ICD-10-CM

## 2013-05-09 ENCOUNTER — Ambulatory Visit (HOSPITAL_COMMUNITY)
Admission: RE | Admit: 2013-05-09 | Discharge: 2013-05-09 | Disposition: A | Discharge status: Home / Self Care | Payer: Medicare Other | Source: Ambulatory Visit | Attending: Internal Medicine | Admitting: Internal Medicine

## 2013-05-09 ENCOUNTER — Other Ambulatory Visit (HOSPITAL_BASED_OUTPATIENT_CLINIC_OR_DEPARTMENT_OTHER): Payer: Medicare Other | Admitting: Lab

## 2013-05-09 ENCOUNTER — Encounter (HOSPITAL_COMMUNITY): Payer: Self-pay

## 2013-05-09 DIAGNOSIS — C3410 Malignant neoplasm of upper lobe, unspecified bronchus or lung: Secondary | ICD-10-CM

## 2013-05-09 DIAGNOSIS — C3490 Malignant neoplasm of unspecified part of unspecified bronchus or lung: Secondary | ICD-10-CM | POA: Insufficient documentation

## 2013-05-09 DIAGNOSIS — G4700 Insomnia, unspecified: Secondary | ICD-10-CM

## 2013-05-09 LAB — CBC WITH DIFFERENTIAL/PLATELET
BASO%: 0.7 % (ref 0.0–2.0)
LYMPH%: 32 % (ref 14.0–49.0)
MCHC: 33.9 g/dL (ref 32.0–36.0)
MCV: 97.2 fL (ref 79.3–98.0)
MONO%: 12.3 % (ref 0.0–14.0)
Platelets: 275 10*3/uL (ref 140–400)
RBC: 4.09 10*6/uL — ABNORMAL LOW (ref 4.20–5.82)

## 2013-05-09 LAB — COMPREHENSIVE METABOLIC PANEL (CC13)
ALT: 20 U/L (ref 0–55)
Alkaline Phosphatase: 78 U/L (ref 40–150)
Glucose: 92 mg/dl (ref 70–140)
Sodium: 136 mEq/L (ref 136–145)
Total Bilirubin: 0.22 mg/dL (ref 0.20–1.20)
Total Protein: 6.6 g/dL (ref 6.4–8.3)

## 2013-05-09 LAB — MAGNESIUM (CC13): Magnesium: 2.2 mg/dl (ref 1.5–2.5)

## 2013-05-09 MED ORDER — IOHEXOL 300 MG/ML  SOLN
80.0000 mL | Freq: Once | INTRAMUSCULAR | Status: AC | PRN
  Administered 2013-05-09: 80 mL via INTRAVENOUS

## 2013-05-10 ENCOUNTER — Encounter: Payer: Self-pay | Admitting: Internal Medicine

## 2013-05-10 ENCOUNTER — Ambulatory Visit (HOSPITAL_BASED_OUTPATIENT_CLINIC_OR_DEPARTMENT_OTHER): Payer: Medicare Other | Admitting: Internal Medicine

## 2013-05-10 DIAGNOSIS — C3410 Malignant neoplasm of upper lobe, unspecified bronchus or lung: Secondary | ICD-10-CM

## 2013-05-10 DIAGNOSIS — G609 Hereditary and idiopathic neuropathy, unspecified: Secondary | ICD-10-CM

## 2013-05-10 DIAGNOSIS — F17200 Nicotine dependence, unspecified, uncomplicated: Secondary | ICD-10-CM

## 2013-05-10 NOTE — Progress Notes (Signed)
[facility] [phone]   Fax:([phone]) [phone]  OFFICE PROGRESS NOTE  Rice, Darren Cheadle, MD [address] [place] [address]  DIAGNOSIS: Stage IIA (T2 B., N0, M0) non-small cell lung cancer, squamous cell carcinoma diagnosed in December of 2013   PRIOR THERAPY:  1) Status post right upper lobectomy under the care of Dr. Donata Clay on 09/14/2012.  2) Adjuvant chemotherapy with cisplatin 75 mg/M2 and docetaxel 75 mg/M2 with Neulasta support every 3 weeks, status post 1 cycle. Starting with cycle #2, patient will receive carboplatin for an AUC initially of 4.5 and paclitaxel at 175 mg per meter squared with Neulasta support given every 3 weeks. Status post a total of 3 cycles.   CURRENT THERAPY: Observation.   CHEMOTHERAPY INTENT: Curative  CURRENT # OF CHEMOTHERAPY CYCLES: 0  CURRENT ANTIEMETICS: Zofran, dexamethasone and Compazine  CURRENT SMOKING STATUS: Current smoker and was strongly advised to quit smoking and altered smoke cessation program.  ORAL CHEMOTHERAPY AND CONSENT: None  CURRENT BISPHOSPHONATES USE: None  PAIN MANAGEMENT: 3/10 controlled by Norco when necessary  NARCOTICS INDUCED CONSTIPATION: Milk of magnesia on as-needed basis.  LIVING WILL AND CODE STATUS: No CODE BLUE.   INTERVAL HISTORY:  [name] 72 y.o. male returns to the clinic today for followup visit accompanied his wife. He denied having any significant nausea or vomiting. He denied having any chest pain, shortness of breath, cough or hemoptysis. 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 The patient had repeat CT scan of the chest performed recently and he is here for evaluation and discussion of his scan results.   MEDICAL HISTORY: Past Medical History  Diagnosis Date  . Arthritis   . Wheezing   . Sore throat   . Carotid artery occlusion   . COPD (chronic obstructive pulmonary disease)   . Lumbar disc disease   . Restless leg syndrome   . Allergic rhinitis   . Hypertension    . Anxiety     anxiety  . Shortness of breath   . Lung mass   . Pneumonia     ? now  . GERD (gastroesophageal reflux disease)   . H/O hiatal hernia   . Cancer     lung mass  . Pre-operative cardiovascular examination   . Nonspecific abnormal unspecified cardiovascular function study   . Peripheral vascular disease, unspecified     ALLERGIES:  has No Known Allergies.  MEDICATIONS:  Current Outpatient Prescriptions  Medication Sig Dispense Refill  . aspirin EC 81 MG tablet Take 81 mg by mouth daily.      . clonazePAM (KLONOPIN) 0.5 MG tablet Take 0.5 mg by mouth 2 (two) times daily as needed for anxiety.      Marland Kitchen HYDROcodone-acetaminophen (NORCO) 7.5-325 MG per tablet Take 1 tablet by mouth every 4 (four) hours as needed for pain.  40 tablet  0  . omeprazole (PRILOSEC) 40 MG capsule Take 40 mg by mouth daily.      . pramipexole (MIRAPEX) 0.5 MG tablet Take 1-1.5 mg by mouth at bedtime as needed. Restless legs      . temazepam (RESTORIL) 30 MG capsule Take 1 capsule (30 mg total) by mouth at bedtime as needed for sleep.  30 capsule  0   No current facility-administered medications for this visit.    SURGICAL HISTORY:  Past Surgical History  Procedure Laterality Date  . Carotid endarterectomy  10/15/2010    left  . Hand surgery  repair of the left long, ring, and small finger after a saw injury  . Video bronchoscopy  07/24/2012    Procedure: VIDEO BRONCHOSCOPY WITH FLUORO;  Surgeon: Barbaraann Share, MD;  Location: [facility];  Service: Cardiopulmonary;  Laterality: Bilateral;  . Lobectomy Right 09/14/2012    Procedure: LOBECTOMY;  Surgeon: Kerin Perna, MD;  Location: [facility] OR;  Service: Thoracic;  Laterality: Right;  RIGHT UPPER LOBECTOMY  . Video assisted thoracoscopy Right 09/14/2012    Procedure: VIDEO ASSISTED THORACOSCOPY;  Surgeon: Kerin Perna, MD;  Location: [facility] OR;  Service: Thoracic;  Laterality: Right;    REVIEW OF SYSTEMS:  A comprehensive review of systems was  negative except for: Constitutional: positive for fatigue Respiratory: positive for dyspnea on exertion   PHYSICAL EXAMINATION: General appearance: alert, cooperative and no distress Head: Normocephalic, without obvious abnormality, atraumatic Neck: no adenopathy, no JVD, supple, symmetrical, trachea midline and thyroid not enlarged, symmetric, no tenderness/mass/nodules Lymph nodes: Cervical, supraclavicular, and axillary nodes normal. Resp: clear to auscultation bilaterally Back: symmetric, no curvature. ROM normal. No CVA tenderness. Cardio: regular rate and rhythm, S1, S2 normal, no murmur, click, rub or gallop GI: soft, non-tender; bowel sounds normal; no masses,  no organomegaly Extremities: extremities normal, atraumatic, no cyanosis or edema  ECOG PERFORMANCE STATUS: 1 - Symptomatic but completely ambulatory  Blood pressure 123/96, pulse 81, temperature 98.1 F (36.7 C), temperature source Oral, resp. rate 20, height 5\' 9"  (1.753 m), weight 164 lb 3.2 oz (74.481 kg).  LABORATORY DATA: Lab Results  Component Value Date   WBC 5.3 05/09/2013   HGB 13.5 05/09/2013   HCT 39.8 05/09/2013   MCV 97.2 05/09/2013   PLT 275 05/09/2013      Chemistry      Component Value Date/Time   NA 136 05/09/2013 0824   NA 134* 09/19/2012 0356   K 3.9 05/09/2013 0824   K 3.3* 09/19/2012 0356   CL 101 12/28/2012 0852   CL 96 09/19/2012 0356   CO2 25 05/09/2013 0824   CO2 32 09/19/2012 0356   BUN 7.1 05/09/2013 0824   BUN 9 09/19/2012 0356   CREATININE 0.8 05/09/2013 0824   CREATININE 0.65 09/19/2012 0356      Component Value Date/Time   CALCIUM 8.9 05/09/2013 0824   CALCIUM 8.9 09/19/2012 0356   ALKPHOS 78 05/09/2013 0824   ALKPHOS 80 09/16/2012 0420   AST 24 05/09/2013 0824   AST 35 09/16/2012 0420   ALT 20 05/09/2013 0824   ALT 19 09/16/2012 0420   BILITOT 0.22 05/09/2013 0824   BILITOT 0.4 09/16/2012 0420       RADIOGRAPHIC STUDIES: Ct Chest W Contrast  05/09/2013   CLINICAL DATA:   Followup right lung carcinoma.  EXAM: CT CHEST WITH CONTRAST  TECHNIQUE: Multidetector CT imaging of the chest was performed during intravenous contrast administration.  CONTRAST:  80mL OMNIPAQUE IOHEXOL 300 MG/ML  SOLN  COMPARISON:  02/05/2013  FINDINGS: Stable postsurgical changes are again seen in the right hemi thorax. No evidence of hilar or mediastinal lymphadenopathy. No adenopathy seen elsewhere within the thorax. No evidence of pleural or pericardial effusion. No suspicious pulmonary nodules or masses are identified. No evidence of pulmonary infiltrate or central endobronchial obstruction. Visualized portions of adrenal glands are unremarkable. No suspicious bone lesions identified.  IMPRESSION: Stable postsurgical changes in the right hemi thorax. No evidence of recurrent or metastatic carcinoma, or other active disease.   Electronically Signed   By: Alver Sorrow.D.  On: 05/09/2013 10:42    ASSESSMENT AND PLAN: This is a very pleasant 72 years old white male with history of stage IIa non-small cell lung cancer status post right upper lobectomy followed by 4 cycles of adjuvant chemotherapy.  His recent CT scan of the chest showed no evidence for disease recurrence.  I discussed the scan results with the patient and his wife.  I recommended for him to continue on observation with repeat CT scan of the chest in 3 months.  He was advised to call immediately if he has any concerning symptoms in the interval.  For smoke cessation, I strongly advise the patient to quit smoking and offered him to smoke cessation program but he mentions that he would like to work on quiting by himself. The patient voices understanding of current disease status and treatment options and is in agreement with the current care plan.  All questions were answered. The patient knows to call the clinic with any problems, questions or concerns. We can certainly see the patient much sooner if necessary.

## 2013-05-11 ENCOUNTER — Telehealth: Payer: Self-pay | Admitting: Internal Medicine

## 2013-05-11 NOTE — Telephone Encounter (Signed)
s.w. pt wife and advised on Jan 2015 appt...ok and aware

## 2013-05-15 ENCOUNTER — Ambulatory Visit: Payer: Medicare Other | Admitting: Thoracic Surgery (Cardiothoracic Vascular Surgery)

## 2013-05-17 ENCOUNTER — Other Ambulatory Visit: Payer: Self-pay

## 2013-07-23 ENCOUNTER — Telehealth: Payer: Self-pay | Admitting: Internal Medicine

## 2013-07-23 NOTE — Telephone Encounter (Signed)
PT CALLED TO R/S LABCT AND EST TO EARLIER D/T...DONE...PT OK AND AWARE

## 2013-07-25 ENCOUNTER — Ambulatory Visit (HOSPITAL_COMMUNITY)
Admission: RE | Admit: 2013-07-25 | Discharge: 2013-07-25 | Disposition: A | Discharge status: Home / Self Care | Payer: Medicare Other | Source: Ambulatory Visit | Attending: Internal Medicine | Admitting: Internal Medicine

## 2013-07-25 ENCOUNTER — Encounter (HOSPITAL_COMMUNITY): Payer: Self-pay

## 2013-07-25 ENCOUNTER — Other Ambulatory Visit (HOSPITAL_BASED_OUTPATIENT_CLINIC_OR_DEPARTMENT_OTHER): Payer: Medicare Other

## 2013-07-25 DIAGNOSIS — R091 Pleurisy: Secondary | ICD-10-CM | POA: Insufficient documentation

## 2013-07-25 DIAGNOSIS — Z9889 Other specified postprocedural states: Secondary | ICD-10-CM | POA: Insufficient documentation

## 2013-07-25 DIAGNOSIS — C3490 Malignant neoplasm of unspecified part of unspecified bronchus or lung: Secondary | ICD-10-CM | POA: Insufficient documentation

## 2013-07-25 DIAGNOSIS — R599 Enlarged lymph nodes, unspecified: Secondary | ICD-10-CM | POA: Insufficient documentation

## 2013-07-25 DIAGNOSIS — IMO0002 Reserved for concepts with insufficient information to code with codable children: Secondary | ICD-10-CM | POA: Insufficient documentation

## 2013-07-25 DIAGNOSIS — C3410 Malignant neoplasm of upper lobe, unspecified bronchus or lung: Secondary | ICD-10-CM

## 2013-07-25 DIAGNOSIS — S2249XA Multiple fractures of ribs, unspecified side, initial encounter for closed fracture: Secondary | ICD-10-CM | POA: Insufficient documentation

## 2013-07-25 DIAGNOSIS — Y658 Other specified misadventures during surgical and medical care: Secondary | ICD-10-CM | POA: Insufficient documentation

## 2013-07-25 DIAGNOSIS — I700 Atherosclerosis of aorta: Secondary | ICD-10-CM | POA: Insufficient documentation

## 2013-07-25 DIAGNOSIS — I2510 Atherosclerotic heart disease of native coronary artery without angina pectoris: Secondary | ICD-10-CM | POA: Insufficient documentation

## 2013-07-25 LAB — CBC WITH DIFFERENTIAL/PLATELET
BASO%: 0.6 % (ref 0.0–2.0)
Basophils Absolute: 0 10*3/uL (ref 0.0–0.1)
EOS%: 1.1 % (ref 0.0–7.0)
Eosinophils Absolute: 0.1 10*3/uL (ref 0.0–0.5)
HCT: 42.3 % (ref 38.4–49.9)
HGB: 14.4 g/dL (ref 13.0–17.1)
LYMPH%: 30.2 % (ref 14.0–49.0)
MCH: 33.7 pg — ABNORMAL HIGH (ref 27.2–33.4)
MCHC: 34 g/dL (ref 32.0–36.0)
MCV: 99.2 fL — AB (ref 79.3–98.0)
MONO#: 0.7 10*3/uL (ref 0.1–0.9)
MONO%: 9.3 % (ref 0.0–14.0)
NEUT#: 4.2 10*3/uL (ref 1.5–6.5)
NEUT%: 58.8 % (ref 39.0–75.0)
PLATELETS: 233 10*3/uL (ref 140–400)
RBC: 4.26 10*6/uL (ref 4.20–5.82)
RDW: 14.4 % (ref 11.0–14.6)
WBC: 7.1 10*3/uL (ref 4.0–10.3)
lymph#: 2.1 10*3/uL (ref 0.9–3.3)

## 2013-07-25 LAB — COMPREHENSIVE METABOLIC PANEL (CC13)
ALK PHOS: 77 U/L (ref 40–150)
ALT: 21 U/L (ref 0–55)
AST: 23 U/L (ref 5–34)
Albumin: 3.9 g/dL (ref 3.5–5.0)
Anion Gap: 9 mEq/L (ref 3–11)
BILIRUBIN TOTAL: 0.4 mg/dL (ref 0.20–1.20)
BUN: 9.1 mg/dL (ref 7.0–26.0)
CO2: 26 mEq/L (ref 22–29)
Calcium: 9.3 mg/dL (ref 8.4–10.4)
Chloride: 101 mEq/L (ref 98–109)
Creatinine: 0.8 mg/dL (ref 0.7–1.3)
Glucose: 90 mg/dl (ref 70–140)
POTASSIUM: 4.1 meq/L (ref 3.5–5.1)
SODIUM: 136 meq/L (ref 136–145)
TOTAL PROTEIN: 6.8 g/dL (ref 6.4–8.3)

## 2013-07-25 LAB — MAGNESIUM (CC13): Magnesium: 2.1 mg/dl (ref 1.5–2.5)

## 2013-07-25 MED ORDER — IOHEXOL 300 MG/ML  SOLN
80.0000 mL | Freq: Once | INTRAMUSCULAR | Status: AC | PRN
  Administered 2013-07-25: 80 mL via INTRAVENOUS

## 2013-07-30 ENCOUNTER — Telehealth: Payer: Self-pay | Admitting: Medical Oncology

## 2013-07-30 NOTE — Telephone Encounter (Signed)
Requests report of CT. His wife is recovering from a bad fall (wife  is double amputee lower extremities.

## 2013-07-30 NOTE — Telephone Encounter (Signed)
Pt will come to appt tomorrow.

## 2013-08-01 ENCOUNTER — Telehealth: Payer: Self-pay | Admitting: Internal Medicine

## 2013-08-01 ENCOUNTER — Encounter: Payer: Self-pay | Admitting: Internal Medicine

## 2013-08-01 ENCOUNTER — Ambulatory Visit (HOSPITAL_BASED_OUTPATIENT_CLINIC_OR_DEPARTMENT_OTHER): Payer: Medicare Other | Admitting: Internal Medicine

## 2013-08-01 VITALS — BP 107/76 | HR 82 | Temp 98.1°F | Resp 18 | Ht 69.0 in | Wt 159.6 lb

## 2013-08-01 DIAGNOSIS — C3490 Malignant neoplasm of unspecified part of unspecified bronchus or lung: Secondary | ICD-10-CM

## 2013-08-01 DIAGNOSIS — C3410 Malignant neoplasm of upper lobe, unspecified bronchus or lung: Secondary | ICD-10-CM

## 2013-08-01 NOTE — Telephone Encounter (Signed)
s/w pt re appts for 4/21 and 4/23. central will call w/ct appt - pt aware.

## 2013-08-01 NOTE — Patient Instructions (Signed)
Smoking Cessation, Tips for Success If you are ready to quit smoking, congratulations! You have chosen to help yourself be healthier. Cigarettes bring nicotine, tar, carbon monoxide, and other irritants into your body. Your lungs, heart, and blood vessels will be able to work better without these poisons. There are many different ways to quit smoking. Nicotine gum, nicotine patches, a nicotine inhaler, or nicotine nasal spray can help with physical craving. Hypnosis, support groups, and medicines help break the habit of smoking. WHAT THINGS CAN I DO TO MAKE QUITTING EASIER?  Here are some tips to help you quit for good:  Pick a date when you will quit smoking completely. Tell all of your friends and family about your plan to quit on that date.  Do not try to slowly cut down on the number of cigarettes you are smoking. Pick a quit date and quit smoking completely starting on that day.  Throw away all cigarettes.   Clean and remove all ashtrays from your home, work, and car.   On a card, write down your reasons for quitting. Carry the card with you and read it when you get the urge to smoke.   Cleanse your body of nicotine. Drink enough water and fluids to keep your urine clear or pale yellow. Do this after quitting to flush the nicotine from your body.   Learn to predict your moods. Do not let a bad situation be your excuse to have a cigarette. Some situations in your life might tempt you into wanting a cigarette.   Never have "just one" cigarette. It leads to wanting another and another. Remind yourself of your decision to quit.   Change habits associated with smoking. If you smoked while driving or when feeling stressed, try other activities to replace smoking. Stand up when drinking your coffee. Brush your teeth after eating. Sit in a different chair when you read the paper. Avoid alcohol while trying to quit, and try to drink fewer caffeinated beverages. Alcohol and caffeine may urge  you to smoke.   Avoid foods and drinks that can trigger a desire to smoke, such as sugary or spicy foods and alcohol.   Ask people who smoke not to smoke around you.   Have something planned to do right after eating or having a cup of coffee. For example, plan to take a walk or exercise.   Try a relaxation exercise to calm you down and decrease your stress. Remember, you may be tense and nervous for the first 2 weeks after you quit, but this will pass.   Find new activities to keep your hands busy. Play with a pen, coin, or rubber band. Doodle or draw things on paper.   Brush your teeth right after eating. This will help cut down on the craving for the taste of tobacco after meals. You can also try mouthwash.   Use oral substitutes in place of cigarettes. Try using lemon drops, carrots, cinnamon sticks, or chewing gum. Keep them handy so they are available when you have the urge to smoke.   When you have the urge to smoke, try deep breathing.   Designate your home as a nonsmoking area.   If you are a heavy smoker, ask your health care provider about a prescription for nicotine chewing gum. It can ease your withdrawal from nicotine.   Reward yourself. Set aside the cigarette money you save and buy yourself something nice.   Look for support from others. Join a support group or   smoking cessation program. Ask someone at home or at work to help you with your plan to quit smoking.   Always ask yourself, "Do I need this cigarette or is this just a reflex?" Tell yourself, "Today, I choose not to smoke," or "I do not want to smoke." You are reminding yourself of your decision to quit.  Do not replace cigarette smoking with electronic cigarettes (commonly called e-cigarettes). The safety of e-cigarettes is unknown, and some may contain harmful chemicals.  If you relapse, do not give up! Plan ahead and think about what you will do the next time you get the urge to smoke.  HOW WILL  I FEEL WHEN I QUIT SMOKING? You may have symptoms of withdrawal because your body is used to nicotine (the addictive substance in cigarettes). You may crave cigarettes, be irritable, feel very hungry, cough often, get headaches, or have difficulty concentrating. The withdrawal symptoms are only temporary. They are strongest when you first quit but will go away within 10 14 days. When withdrawal symptoms occur, stay in control. Think about your reasons for quitting. Remind yourself that these are signs that your body is healing and getting used to being without cigarettes. Remember that withdrawal symptoms are easier to treat than the major diseases that smoking can cause.  Even after the withdrawal is over, expect periodic urges to smoke. However, these cravings are generally short lived and will go away whether you smoke or not. Do not smoke!  WHAT RESOURCES ARE AVAILABLE TO HELP ME QUIT SMOKING? Your health care provider can direct you to community resources or hospitals for support, which may include:  Group support.  Education.  Hypnosis.  Therapy. Document Released: 03/26/2004 Document Revised: 04/18/2013 Document Reviewed: 12/14/2012 [employer] Patient Information 2014 [employer].  

## 2013-08-01 NOTE — Progress Notes (Signed)
[facility] Telephone:([phone]) [phone]   Fax:(336) [phone]  OFFICE PROGRESS NOTE  [name], MD [place] [address]  DIAGNOSIS: Stage IIA (T2 B., N0, M0) non-small cell lung cancer, squamous cell carcinoma diagnosed in December of 2013   PRIOR THERAPY:  1) Status post right upper lobectomy under the care of Dr. Prescott Gum on 09/14/2012.  2) Adjuvant chemotherapy with cisplatin 75 mg/M2 and docetaxel 75 mg/M2 with Neulasta support every 3 weeks, status post 1 cycle. Starting with cycle #2, patient will receive carboplatin for an AUC initially of 4.5 and paclitaxel at 175 mg per meter squared with Neulasta support given every 3 weeks. Status post a total of 3 cycles.   CURRENT THERAPY: Observation.   CHEMOTHERAPY INTENT: Curative  CURRENT # OF CHEMOTHERAPY CYCLES: 0  CURRENT ANTIEMETICS: Zofran, dexamethasone and Compazine  CURRENT SMOKING STATUS: Current smoker and was strongly advised to quit smoking and altered smoke cessation program.  ORAL CHEMOTHERAPY AND CONSENT: None  CURRENT BISPHOSPHONATES USE: None  PAIN MANAGEMENT: 3/10 controlled by Norco when necessary  NARCOTICS INDUCED CONSTIPATION: Milk of magnesia on as-needed basis.  LIVING WILL AND CODE STATUS: No CODE BLUE.   INTERVAL HISTORY:  [place]AY HAILES 73 y.o. male returns to the clinic today for followup visit accompanied his daughter, [name]. He denied having any significant nausea or vomiting. He denied having any chest pain, shortness of breath, cough or hemoptysis. He was complaining of pain in the right side of the back secondary to muscle spasm when he was working in his [place]. The patient had repeat CT scan of the chest performed recently and he is here for evaluation and discussion of his scan results.   MEDICAL HISTORY: Past Medical History  Diagnosis Date  . Arthritis   . Wheezing   . Sore throat   . Carotid artery occlusion   . COPD (chronic obstructive pulmonary disease)    . Lumbar disc disease   . Restless leg syndrome   . Allergic rhinitis   . Hypertension   . Anxiety     anxiety  . Shortness of breath   . Lung mass   . Pneumonia     ? now  . GERD (gastroesophageal reflux disease)   . H/O hiatal hernia   . Cancer     lung mass  . Pre-operative cardiovascular examination   . Nonspecific abnormal unspecified cardiovascular function study   . Peripheral vascular disease, unspecified     ALLERGIES:  has No Known Allergies.  MEDICATIONS:  Current Outpatient Prescriptions  Medication Sig Dispense Refill  . aspirin EC 81 MG tablet Take 81 mg by mouth daily.      . clonazePAM (KLONOPIN) 0.5 MG tablet Take 0.5 mg by mouth 2 (two) times daily as needed for anxiety.      Marland Kitchen HYDROcodone-acetaminophen (NORCO) 7.5-325 MG per tablet Take 1 tablet by mouth every 4 (four) hours as needed for pain.  40 tablet  0  . omeprazole (PRILOSEC) 40 MG capsule Take 40 mg by mouth daily.      . temazepam (RESTORIL) 30 MG capsule Take 1 capsule (30 mg total) by mouth at bedtime as needed for sleep.  30 capsule  0  . pramipexole (MIRAPEX) 0.5 MG tablet Take 1-1.5 mg by mouth at bedtime as needed. Restless legs       No current facility-administered medications for this visit.    SURGICAL HISTORY:  Past Surgical History  Procedure Laterality Date  . Carotid endarterectomy  10/15/2010    left  . Hand surgery      repair of the left long, ring, and small finger after a saw injury  . Video bronchoscopy  07/24/2012    Procedure: VIDEO BRONCHOSCOPY WITH FLUORO;  Surgeon: Kathee Delton, MD;  Location: [facility];  Service: Cardiopulmonary;  Laterality: Bilateral;  . Lobectomy Right 09/14/2012    Procedure: LOBECTOMY;  Surgeon: Ivin Poot, MD;  Location: [facility];  Service: Thoracic;  Laterality: Right;  RIGHT UPPER LOBECTOMY  . Video assisted thoracoscopy Right 09/14/2012    Procedure: VIDEO ASSISTED THORACOSCOPY;  Surgeon: Ivin Poot, MD;  Location: [facility];  Service:  Thoracic;  Laterality: Right;    REVIEW OF SYSTEMS:  A comprehensive review of systems was negative except for: Musculoskeletal: positive for back pain   PHYSICAL EXAMINATION: General appearance: alert, cooperative and no distress Head: Normocephalic, without obvious abnormality, atraumatic Neck: no adenopathy, no JVD, supple, symmetrical, trachea midline and thyroid not enlarged, symmetric, no tenderness/mass/nodules Lymph nodes: Cervical, supraclavicular, and axillary nodes normal. Resp: clear to auscultation bilaterally Back: symmetric, no curvature. ROM normal. No CVA tenderness. Cardio: regular rate and rhythm, S1, S2 normal, no murmur, click, rub or gallop GI: soft, non-tender; bowel sounds normal; no masses,  no organomegaly Extremities: extremities normal, atraumatic, no cyanosis or edema  ECOG PERFORMANCE STATUS: 1 - Symptomatic but completely ambulatory  Blood pressure 107/76, pulse 82, temperature 98.1 F (36.7 C), temperature source Oral, resp. rate 18, height 5\' 9"  (1.753 m), weight 159 lb 9.6 oz (72.394 kg), SpO2 99.00%.  LABORATORY DATA: Lab Results  Component Value Date   WBC 7.1 07/25/2013   HGB 14.4 07/25/2013   HCT 42.3 07/25/2013   MCV 99.2* 07/25/2013   PLT 233 07/25/2013      Chemistry      Component Value Date/Time   NA 136 07/25/2013 1107   NA 134* 09/19/2012 0356   K 4.1 07/25/2013 1107   K 3.3* 09/19/2012 0356   CL 101 12/28/2012 0852   CL 96 09/19/2012 0356   CO2 26 07/25/2013 1107   CO2 32 09/19/2012 0356   BUN 9.1 07/25/2013 1107   BUN 9 09/19/2012 0356   CREATININE 0.8 07/25/2013 1107   CREATININE 0.65 09/19/2012 0356      Component Value Date/Time   CALCIUM 9.3 07/25/2013 1107   CALCIUM 8.9 09/19/2012 0356   ALKPHOS 77 07/25/2013 1107   ALKPHOS 80 09/16/2012 0420   AST 23 07/25/2013 1107   AST 35 09/16/2012 0420   ALT 21 07/25/2013 1107   ALT 19 09/16/2012 0420   BILITOT 0.40 07/25/2013 1107   BILITOT 0.4 09/16/2012 0420       RADIOGRAPHIC STUDIES: Ct  Chest W Contrast  07/25/2013   CLINICAL DATA:  Lung cancer restaging.  EXAM: CT CHEST WITH CONTRAST  TECHNIQUE: Multidetector CT imaging of the chest was performed during intravenous contrast administration.  CONTRAST:  38mL OMNIPAQUE IOHEXOL 300 MG/ML  SOLN  COMPARISON:  05/09/2013.  FINDINGS: The chest wall is unremarkable and stable. No supraclavicular or axillary mass or adenopathy. The thyroid gland is normal. The bony thorax is intact. No destructive bone lesions or spinal canal compromise. There are surgical changes involving the right ribs due to a prior thoracotomy with associated rib fractures.  The heart is normal in size. No pericardial effusion. No mediastinal or hilar mass or adenopathy. Interval enlargement of the right pretracheal node on image number 22.  It measures 19 x 11 mm and previously measured 14.5 x 9 mm. Attention on future scans is suggested. I do not see any other enlarged lymph nodes. Stable surgical changes in the right hilum. Stable coronary artery calcifications and aortic and branch vessel calcifications. The esophagus is grossly normal.  Examination of the lung parenchyma demonstrates stable scarring changes. No worrisome pulmonary nodules or masses. Small calcified pleural plaques are noted. No acute pulmonary findings. No pleural effusion. Mild stable emphysematous changes.  The upper abdomen is unremarkable. Stable advanced atherosclerotic calcifications involving the aorta and branch vessels. The adrenal glands are normal. No liver lesions.  IMPRESSION: Stable postoperative changes involving the right hemi thorax without findings for recurrent tumor or metastatic pulmonary disease.  Enlarging right pretracheal lymph node. Recommend attention on future scans. No other enlarged mediastinal or hilar lymph nodes.  Stable dense atherosclerotic calcifications involving the aorta and coronary arteries.   Electronically Signed   By: Kalman Jewels M.D.   On: 07/25/2013 13:21      ASSESSMENT AND PLAN: This is a very pleasant 73 years old white male with history of stage IIa non-small cell lung cancer status post right upper lobectomy followed by 4 cycles of adjuvant chemotherapy.  His recent CT scan of the chest showed no evidence for disease recurrence, except for enlarging right pretracheal lymph node.  I discussed the scan results with the patient and his daughter.  I recommended for him to continue on observation with repeat CT scan of the chest in 3 months.  He was advised to call immediately if he has any concerning symptoms in the interval.  The patient voices understanding of current disease status and treatment options and is in agreement with the current care plan.  All questions were answered. The patient knows to call the clinic with any problems, questions or concerns. We can certainly see the patient much sooner if necessary.  Disclaimer: This note was dictated with voice recognition software. Similar sounding words can inadvertently be transcribed and may not be corrected upon review.

## 2013-08-09 ENCOUNTER — Ambulatory Visit (HOSPITAL_COMMUNITY): Payer: Medicare Other

## 2013-08-09 ENCOUNTER — Other Ambulatory Visit: Payer: Medicare Other

## 2013-08-13 ENCOUNTER — Ambulatory Visit: Payer: Medicare Other | Admitting: Internal Medicine

## 2013-08-23 ENCOUNTER — Other Ambulatory Visit: Payer: Self-pay | Admitting: Cardiology

## 2013-08-23 MED ORDER — ISOSORBIDE MONONITRATE ER 30 MG PO TB24: 30.0000 mg | ORAL_TABLET | Freq: Every day | ORAL | Status: DC

## 2013-10-02 ENCOUNTER — Encounter: Payer: Self-pay | Admitting: Interventional Cardiology

## 2013-10-02 ENCOUNTER — Other Ambulatory Visit: Payer: Self-pay | Admitting: Cardiology

## 2013-10-02 MED ORDER — ISOSORBIDE MONONITRATE ER 30 MG PO TB24: 30.0000 mg | ORAL_TABLET | Freq: Every day | ORAL | Status: DC

## 2013-10-16 ENCOUNTER — Encounter: Payer: Self-pay | Admitting: Family

## 2013-10-17 ENCOUNTER — Ambulatory Visit: Payer: Medicare Other | Admitting: Family

## 2013-10-17 ENCOUNTER — Ambulatory Visit (HOSPITAL_COMMUNITY)
Admission: RE | Admit: 2013-10-17 | Discharge: 2013-10-17 | Disposition: A | Discharge status: Home / Self Care | Payer: Medicare Other | Source: Ambulatory Visit | Attending: Family | Admitting: Family

## 2013-10-17 DIAGNOSIS — M79609 Pain in unspecified limb: Secondary | ICD-10-CM | POA: Insufficient documentation

## 2013-10-17 DIAGNOSIS — I739 Peripheral vascular disease, unspecified: Secondary | ICD-10-CM | POA: Insufficient documentation

## 2013-10-22 NOTE — Patient Instructions (Signed)
Dear Mr.  Tavano,  Your last CAROTID vascular Lab study shows :  No significant change compared to the previous exam on 04-18-13 You  Have severe blockage on the right. STOP SMOKING Follow up with [place] in 6 months.          Smoking Cessation Quitting smoking is important to your health and has many advantages. However, it is not always easy to quit since nicotine is a very addictive drug. Often times, people try 3 times or more before being able to quit. This document explains the best ways for you to prepare to quit smoking. Quitting takes hard work and a lot of effort, but you can do it. ADVANTAGES OF QUITTING SMOKING  You will live longer, feel better, and live better.  Your body will feel the impact of quitting smoking almost immediately.  Within 20 minutes, blood pressure decreases. Your pulse returns to its normal level.  After 8 hours, carbon monoxide levels in the blood return to normal. Your oxygen level increases.  After 24 hours, the chance of having a heart attack starts to decrease. Your breath, hair, and body stop smelling like smoke.  After 48 hours, damaged nerve endings begin to recover. Your sense of taste and smell improve.  After 72 hours, the body is virtually free of nicotine. Your bronchial tubes relax and breathing becomes easier.  After 2 to 12 weeks, lungs can hold more air. Exercise becomes easier and circulation improves.  The risk of having a heart attack, stroke, cancer, or lung disease is greatly reduced.  After 1 year, the risk of coronary heart disease is cut in half.  After 5 years, the risk of stroke falls to the same as a nonsmoker.  After 10 years, the risk of lung cancer is cut in half and the risk of other cancers decreases significantly.  After 15 years, the risk of coronary heart disease drops, usually to the level of a nonsmoker.  If you are pregnant, quitting smoking will improve your chances of having a healthy baby.  The people  you live with, especially any children, will be healthier.  You will have extra money to spend on things other than cigarettes. QUESTIONS TO THINK ABOUT BEFORE ATTEMPTING TO QUIT You may want to talk about your answers with your caregiver.  Why do you want to quit?  If you tried to quit in the past, what helped and what did not?  What will be the most difficult situations for you after you quit? How will you plan to handle them?  Who can help you through the tough times? Your family? Friends? A caregiver?  What pleasures do you get from smoking? What ways can you still get pleasure if you quit? Here are some questions to ask your caregiver:  How can you help me to be successful at quitting?  What medicine do you think would be best for me and how should I take it?  What should I do if I need more help?  What is smoking withdrawal like? How can I get information on withdrawal? GET READY  Set a quit date.  Change your environment by getting rid of all cigarettes, ashtrays, matches, and lighters in your home, car, or work. Do not let people smoke in your home.  Review your past attempts to quit. Think about what worked and what did not. GET SUPPORT AND ENCOURAGEMENT You have a better chance of being successful if you have help. You can get support in  many ways.  Tell your family, friends, and co-workers that you are going to quit and need their support. Ask them not to smoke around you.  Get individual, group, or telephone counseling and support. Programs are available at [employer] and health centers. Call your local health department for information about programs in your area.  Spiritual beliefs and practices may help some smokers quit.  Download a "quit meter" on your computer to keep track of quit statistics, such as how long you have gone without smoking, cigarettes not smoked, and money saved.  Get a self-help book about quitting smoking and staying off of  tobacco. [place] yourself from urges to smoke. Talk to someone, go for a walk, or occupy your time with a task.  Change your normal routine. Take a different route to work. Drink tea instead of coffee. Eat breakfast in a different place.  Reduce your stress. Take a hot bath, exercise, or read a book.  Plan something enjoyable to do every day. Reward yourself for not smoking.  Explore interactive web-based programs that specialize in helping you quit. GET MEDICINE AND USE IT CORRECTLY Medicines can help you stop smoking and decrease the urge to smoke. Combining medicine with the above behavioral methods and support can greatly increase your chances of successfully quitting smoking.  Nicotine replacement therapy helps deliver nicotine to your body without the negative effects and risks of smoking. Nicotine replacement therapy includes nicotine gum, lozenges, inhalers, nasal sprays, and skin patches. Some may be available over-the-counter and others require a prescription.  Antidepressant medicine helps people abstain from smoking, but how this works is unknown. This medicine is available by prescription.  Nicotinic receptor partial agonist medicine simulates the effect of nicotine in your brain. This medicine is available by prescription. Ask your caregiver for advice about which medicines to use and how to use them based on your health history. Your caregiver will tell you what side effects to look out for if you choose to be on a medicine or therapy. Carefully read the information on the package. Do not use any other product containing nicotine while using a nicotine replacement product.  RELAPSE OR DIFFICULT SITUATIONS Most relapses occur within the first 3 months after quitting. Do not be discouraged if you start smoking again. Remember, most people try several times before finally quitting. You may have symptoms of withdrawal because your body is used to  nicotine. You may crave cigarettes, be irritable, feel very hungry, cough often, get headaches, or have difficulty concentrating. The withdrawal symptoms are only temporary. They are strongest when you first quit, but they will go away within 10 14 days. To reduce the chances of relapse, try to:  Avoid drinking alcohol. Drinking lowers your chances of successfully quitting.  Reduce the amount of caffeine you consume. Once you quit smoking, the amount of caffeine in your body increases and can give you symptoms, such as a rapid heartbeat, sweating, and anxiety.  Avoid smokers because they can make you want to smoke.  Do not let weight gain distract you. Many smokers will gain weight when they quit, usually less than 10 pounds. Eat a healthy diet and stay active. You can always lose the weight gained after you quit.  Find ways to improve your mood other than smoking. FOR MORE INFORMATION  www.smokefree.gov  Document Released: 06/22/2001 Document Revised: 12/28/2011 Document Reviewed: 10/07/2011 [facility] Patient Information 2014 [place], [place]. Stroke Prevention Some medical conditions and behaviors are  associated with an increased chance of having a stroke. You may prevent a stroke by making healthy choices and managing medical conditions. HOW CAN I REDUCE MY RISK OF HAVING A STROKE?   Stay physically active. Get at least 30 minutes of activity on most or all days.  Do not smoke. It may also be helpful to avoid exposure to secondhand smoke.  Limit alcohol use. Moderate alcohol use is considered to be:  No more than 2 drinks per day for men.  No more than 1 drink per day for nonpregnant women.  Eat healthy foods. This involves  Eating 5 or more servings of fruits and vegetables a day.  Following a diet that addresses high blood pressure (hypertension), high cholesterol, diabetes, or obesity.  Manage your cholesterol levels.  A diet low in saturated fat, trans fat, and cholesterol  and high in fiber may control cholesterol levels.  Take any prescribed medicines to control cholesterol as directed by your health care provider.  Manage your diabetes.  A controlled-carbohydrate, controlled-sugar diet is recommended to manage diabetes.  Take any prescribed medicines to control diabetes as directed by your health care provider.  Control your hypertension.  A low-salt (sodium), low-saturated fat, low-trans fat, and low-cholesterol diet is recommended to manage hypertension.  Take any prescribed medicines to control hypertension as directed by your health care provider.  Maintain a healthy weight.  A reduced-calorie, low-sodium, low-saturated fat, low-trans fat, low-cholesterol diet is recommended to manage weight.  Stop drug abuse.  Avoid taking birth control pills.  Talk to your health care provider about the risks of taking birth control pills if you are over 26 years old, smoke, get migraines, or have ever had a blood clot.  Get evaluated for sleep disorders (sleep apnea).  Talk to your health care provider about getting a sleep evaluation if you snore a lot or have excessive sleepiness.  Take medicines as directed by your health care provider.  For some people, aspirin or blood thinners (anticoagulants) are helpful in reducing the risk of forming abnormal blood clots that can lead to stroke. If you have the irregular heart rhythm of atrial fibrillation, you should be on a blood thinner unless there is a good reason you cannot take them.  Understand all your medicine instructions.  Make sure that other other conditions (such as anemia or atherosclerosis) are addressed. SEEK IMMEDIATE MEDICAL CARE IF:   You have sudden weakness or numbness of the face, arm, or leg, especially on one side of the body.  Your face or eyelid droops to one side.  You have sudden confusion.  You have trouble speaking (aphasia) or understanding.  You have sudden trouble  seeing in one or both eyes.  You have sudden trouble walking.  You have dizziness.  You have a loss of balance or coordination.  You have a sudden, severe headache with no known cause.  You have new chest pain or an irregular heartbeat. Any of these symptoms may represent a serious problem that is an emergency. Do not wait to see if the symptoms will go away. Get medical help at once. Call your local emergency services  (911 in [place] Do not drive yourself to the hospital. Document Released: 08/05/2004 Document Revised: 04/18/2013 Document Reviewed: 12/29/2012 [facility] Patient Information 2014 [place].

## 2013-10-23 ENCOUNTER — Encounter: Payer: Self-pay | Admitting: Vascular Surgery

## 2013-10-25 NOTE — Progress Notes (Signed)
Lab only 

## 2013-10-30 ENCOUNTER — Other Ambulatory Visit (HOSPITAL_BASED_OUTPATIENT_CLINIC_OR_DEPARTMENT_OTHER): Payer: Medicare Other

## 2013-10-30 ENCOUNTER — Encounter (HOSPITAL_COMMUNITY): Payer: Self-pay

## 2013-10-30 ENCOUNTER — Ambulatory Visit (HOSPITAL_COMMUNITY)
Admission: RE | Admit: 2013-10-30 | Discharge: 2013-10-30 | Disposition: A | Discharge status: Home / Self Care | Payer: Medicare Other | Source: Ambulatory Visit | Attending: Internal Medicine | Admitting: Internal Medicine

## 2013-10-30 DIAGNOSIS — C3490 Malignant neoplasm of unspecified part of unspecified bronchus or lung: Secondary | ICD-10-CM | POA: Insufficient documentation

## 2013-10-30 DIAGNOSIS — Z902 Acquired absence of lung [part of]: Secondary | ICD-10-CM | POA: Insufficient documentation

## 2013-10-30 DIAGNOSIS — I2584 Coronary atherosclerosis due to calcified coronary lesion: Secondary | ICD-10-CM | POA: Insufficient documentation

## 2013-10-30 DIAGNOSIS — J984 Other disorders of lung: Secondary | ICD-10-CM | POA: Diagnosis not present

## 2013-10-30 DIAGNOSIS — C3410 Malignant neoplasm of upper lobe, unspecified bronchus or lung: Secondary | ICD-10-CM

## 2013-10-30 LAB — CBC WITH DIFFERENTIAL/PLATELET
BASO%: 1 % (ref 0.0–2.0)
BASOS ABS: 0.1 10*3/uL (ref 0.0–0.1)
EOS%: 1.9 % (ref 0.0–7.0)
Eosinophils Absolute: 0.1 10*3/uL (ref 0.0–0.5)
HEMATOCRIT: 43.5 % (ref 38.4–49.9)
HEMOGLOBIN: 14.6 g/dL (ref 13.0–17.1)
LYMPH#: 2.1 10*3/uL (ref 0.9–3.3)
LYMPH%: 36.3 % (ref 14.0–49.0)
MCH: 32.8 pg (ref 27.2–33.4)
MCHC: 33.5 g/dL (ref 32.0–36.0)
MCV: 97.8 fL (ref 79.3–98.0)
MONO#: 0.5 10*3/uL (ref 0.1–0.9)
MONO%: 9.3 % (ref 0.0–14.0)
NEUT#: 2.9 10*3/uL (ref 1.5–6.5)
NEUT%: 51.5 % (ref 39.0–75.0)
PLATELETS: 255 10*3/uL (ref 140–400)
RBC: 4.45 10*6/uL (ref 4.20–5.82)
RDW: 13.7 % (ref 11.0–14.6)
WBC: 5.7 10*3/uL (ref 4.0–10.3)

## 2013-10-30 LAB — COMPREHENSIVE METABOLIC PANEL (CC13)
ALT: 16 U/L (ref 0–55)
AST: 19 U/L (ref 5–34)
Albumin: 4 g/dL (ref 3.5–5.0)
Alkaline Phosphatase: 74 U/L (ref 40–150)
Anion Gap: 8 mEq/L (ref 3–11)
BUN: 6.9 mg/dL — AB (ref 7.0–26.0)
CALCIUM: 9.5 mg/dL (ref 8.4–10.4)
CHLORIDE: 99 meq/L (ref 98–109)
CO2: 28 mEq/L (ref 22–29)
CREATININE: 0.8 mg/dL (ref 0.7–1.3)
Glucose: 104 mg/dl (ref 70–140)
Potassium: 4.3 mEq/L (ref 3.5–5.1)
Sodium: 135 mEq/L — ABNORMAL LOW (ref 136–145)
Total Bilirubin: 0.22 mg/dL (ref 0.20–1.20)
Total Protein: 6.7 g/dL (ref 6.4–8.3)

## 2013-10-30 MED ORDER — IOHEXOL 300 MG/ML  SOLN
80.0000 mL | Freq: Once | INTRAMUSCULAR | Status: AC | PRN
  Administered 2013-10-30: 80 mL via INTRAVENOUS

## 2013-11-01 ENCOUNTER — Encounter: Payer: Self-pay | Admitting: Internal Medicine

## 2013-11-01 ENCOUNTER — Ambulatory Visit (HOSPITAL_BASED_OUTPATIENT_CLINIC_OR_DEPARTMENT_OTHER): Payer: Medicare Other | Admitting: Internal Medicine

## 2013-11-01 ENCOUNTER — Other Ambulatory Visit: Payer: Self-pay | Admitting: Cardiology

## 2013-11-01 VITALS — BP 162/72 | HR 80 | Temp 98.3°F | Resp 20 | Ht 69.0 in | Wt 159.6 lb

## 2013-11-01 DIAGNOSIS — C3490 Malignant neoplasm of unspecified part of unspecified bronchus or lung: Secondary | ICD-10-CM

## 2013-11-01 DIAGNOSIS — R599 Enlarged lymph nodes, unspecified: Secondary | ICD-10-CM

## 2013-11-01 DIAGNOSIS — C3410 Malignant neoplasm of upper lobe, unspecified bronchus or lung: Secondary | ICD-10-CM

## 2013-11-01 MED ORDER — ISOSORBIDE MONONITRATE ER 30 MG PO TB24: 30.0000 mg | ORAL_TABLET | Freq: Every day | ORAL | Status: DC

## 2013-11-01 NOTE — Progress Notes (Signed)
[facility] [phone]   Fax:([phone]  OFFICE PROGRESS NOTE  Darren Miyamoto, MD [name] [address]  DIAGNOSIS: Stage IIA (T2 B., N0, M0) non-small cell lung cancer, squamous cell carcinoma diagnosed in December of 2013   PRIOR THERAPY:  1) Status post right upper lobectomy under the care of Dr. Prescott Gum on 09/14/2012.  2) Adjuvant chemotherapy with cisplatin 75 mg/M2 and docetaxel 75 mg/M2 with Neulasta support every 3 weeks, status post 1 cycle. Starting with cycle #2, patient will receive carboplatin for an AUC initially of 4.5 and paclitaxel at 175 mg per meter squared with Neulasta support given every 3 weeks. Status post a total of 3 cycles.   CURRENT THERAPY: Observation.   CHEMOTHERAPY INTENT: Curative  CURRENT # OF CHEMOTHERAPY CYCLES: 0  CURRENT ANTIEMETICS: Zofran, dexamethasone and Compazine  CURRENT SMOKING STATUS: Current smoker and was strongly advised to quit smoking and altered smoke cessation program.  ORAL CHEMOTHERAPY AND CONSENT: None  CURRENT BISPHOSPHONATES USE: None  PAIN MANAGEMENT: 3/10 controlled by Norco when necessary  NARCOTICS INDUCED CONSTIPATION: Milk of magnesia on as-needed basis.  LIVING WILL AND CODE STATUS: No CODE BLUE.   INTERVAL HISTORY:  [name] 73 y.o. male returns to the clinic today for followup visit accompanied his wife and daughter, [name]. He denied having any significant nausea or vomiting. He denied having any chest pain, shortness of breath, cough or hemoptysis. Unfortunately he continues to smoke one pack a day and I strongly encouraged him to quit smoking. The patient had repeat CT scan of the chest performed recently and he is here for evaluation and discussion of his scan results.   MEDICAL HISTORY: Past Medical History  Diagnosis Date  . Arthritis   . Wheezing   . Sore throat   . Carotid artery occlusion   . COPD (chronic obstructive pulmonary disease)   .  Lumbar disc disease   . Restless leg syndrome   . Allergic rhinitis   . Hypertension   . Anxiety     anxiety  . Shortness of breath   . Lung mass   . Pneumonia     ? now  . GERD (gastroesophageal reflux disease)   . H/O hiatal hernia   . Cancer     lung mass  . Pre-operative cardiovascular examination   . Nonspecific abnormal unspecified cardiovascular function study   . Peripheral vascular disease, unspecified     ALLERGIES:  has No Known Allergies.  MEDICATIONS:  Current Outpatient Prescriptions  Medication Sig Dispense Refill  . aspirin EC 81 MG tablet Take 81 mg by mouth daily.      . clonazePAM (KLONOPIN) 0.5 MG tablet Take 0.5 mg by mouth 2 (two) times daily as needed for anxiety.      Marland Kitchen HYDROcodone-acetaminophen (NORCO) 7.5-325 MG per tablet Take 1 tablet by mouth every 4 (four) hours as needed for pain.  40 tablet  0  . isosorbide mononitrate (IMDUR) 30 MG 24 hr tablet Take 1 tablet (30 mg total) by mouth daily.  30 tablet  0  . omeprazole (PRILOSEC) 40 MG capsule Take 40 mg by mouth daily.      . pramipexole (MIRAPEX) 0.5 MG tablet Take 1-1.5 mg by mouth at bedtime as needed. Restless legs      . temazepam (RESTORIL) 30 MG capsule Take 1 capsule (30 mg total) by mouth at bedtime as needed for sleep.  30 capsule  0   No current facility-administered medications for this visit.    SURGICAL HISTORY:  Past Surgical History  Procedure Laterality Date  . Carotid endarterectomy  10/15/2010    left  . Hand surgery      repair of the left long, ring, and small finger after a saw injury  . Video bronchoscopy  07/24/2012    Procedure: VIDEO BRONCHOSCOPY WITH FLUORO;  Surgeon: Kathee Delton, MD;  Location: [facility];  Service: Cardiopulmonary;  Laterality: Bilateral;  . Lobectomy Right 09/14/2012    Procedure: LOBECTOMY;  Surgeon: Ivin Poot, MD;  Location: [facility];  Service: Thoracic;  Laterality: Right;  RIGHT UPPER LOBECTOMY  . Video assisted thoracoscopy Right  09/14/2012    Procedure: VIDEO ASSISTED THORACOSCOPY;  Surgeon: Ivin Poot, MD;  Location: [facility];  Service: Thoracic;  Laterality: Right;    REVIEW OF SYSTEMS:  A comprehensive review of systems was negative except for: Musculoskeletal: positive for back pain   PHYSICAL EXAMINATION: General appearance: alert, cooperative and no distress Head: Normocephalic, without obvious abnormality, atraumatic Neck: no adenopathy, no JVD, supple, symmetrical, trachea midline and thyroid not enlarged, symmetric, no tenderness/mass/nodules Lymph nodes: Cervical, supraclavicular, and axillary nodes normal. Resp: clear to auscultation bilaterally Back: symmetric, no curvature. ROM normal. No CVA tenderness. Cardio: regular rate and rhythm, S1, S2 normal, no murmur, click, rub or gallop GI: soft, non-tender; bowel sounds normal; no masses,  no organomegaly Extremities: extremities normal, atraumatic, no cyanosis or edema  ECOG PERFORMANCE STATUS: 1 - Symptomatic but completely ambulatory  Blood pressure 162/72, pulse 80, temperature 98.3 F (36.8 C), temperature source Oral, resp. rate 20, height 5\' 9"  (1.753 m), weight 159 lb 9.6 oz (72.394 kg).  LABORATORY DATA: Lab Results  Component Value Date   WBC 5.7 10/30/2013   HGB 14.6 10/30/2013   HCT 43.5 10/30/2013   MCV 97.8 10/30/2013   PLT 255 10/30/2013      Chemistry      Component Value Date/Time   NA 135* 10/30/2013 0854   NA 134* 09/19/2012 0356   K 4.3 10/30/2013 0854   K 3.3* 09/19/2012 0356   CL 101 12/28/2012 0852   CL 96 09/19/2012 0356   CO2 28 10/30/2013 0854   CO2 32 09/19/2012 0356   BUN 6.9* 10/30/2013 0854   BUN 9 09/19/2012 0356   CREATININE 0.8 10/30/2013 0854   CREATININE 0.65 09/19/2012 0356      Component Value Date/Time   CALCIUM 9.5 10/30/2013 0854   CALCIUM 8.9 09/19/2012 0356   ALKPHOS 74 10/30/2013 0854   ALKPHOS 80 09/16/2012 0420   AST 19 10/30/2013 0854   AST 35 09/16/2012 0420   ALT 16 10/30/2013 0854   ALT 19 09/16/2012 0420     BILITOT 0.22 10/30/2013 0854   BILITOT 0.4 09/16/2012 0420       RADIOGRAPHIC STUDIES: Ct Chest W Contrast  10/30/2013   CLINICAL DATA:  Restaging right lung cancer. Prior right lung resection and chemotherapy.  EXAM: CT CHEST WITH CONTRAST  TECHNIQUE: Multidetector CT imaging of the chest was performed during intravenous contrast administration.  CONTRAST:  104mL OMNIPAQUE IOHEXOL 300 MG/ML  SOLN  COMPARISON:  CT CHEST W/CM dated 07/25/2013  FINDINGS: Postsurgical changes on the right. Areas of scarring anteriorly in the right lung. No residual or recurrent mass. No pulmonary nodules bilaterally. No pleural effusions. Heart is normal size. Aorta is normal caliber. Dense coronary artery calcifications again noted, stable.  Upper right paratracheal lymph nodes have  slightly enlarged. Index lymph node on image 13 has a short axis diameter of 9 mm, difficult to measure on the prior study. Other adjacent high right paratracheal lymph node slightly more prominent as well. Partially calcified lower right paratracheal lymph nodes are stable, including index lymph node on image 22 measuring 15 x 11 mm compared to 19 x 11 mm previously. No hilar or axillary adenopathy.  Chest wall soft tissues are unremarkable. Imaging into the upper abdomen shows no acute findings.  No acute bony abnormality or focal bone lesion. Postsurgical changes in the right ribs.  IMPRESSION: Postoperative changes on the right. No suspicious pulmonary nodules or masses.  There are increasing high right paratracheal lymph nodes since prior study. Recommend continued close interval follow-up or further characterization with PET CT.  Extensive coronary artery calcifications.   Electronically Signed   By: Rolm Baptise M.D.   On: 10/30/2013 12:21   ASSESSMENT AND PLAN: This is a very pleasant 73 years old white male with history of stage IIa non-small cell lung cancer status post right upper lobectomy followed by 4 cycles of adjuvant chemotherapy.   His recent CT scan of the chest showed no evidence for disease recurrence, except for slightly enlarging right pretracheal lymph node.  I discussed the scan results with the patient and his family.  I recommended for him to continue on observation with repeat CT scan of the chest in 3 months.  For smoke cessation, I strongly encouraged the patient to quit smoking and offered him to smoke cessation program but he declined. He was advised to call immediately if he has any concerning symptoms in the interval.  The patient voices understanding of current disease status and treatment options and is in agreement with the current care plan.  All questions were answered. The patient knows to call the clinic with any problems, questions or concerns. We can certainly see the patient much sooner if necessary.  Disclaimer: This note was dictated with voice recognition software. Similar sounding words can inadvertently be transcribed and may not be corrected upon review.

## 2013-11-01 NOTE — Patient Instructions (Signed)
Smoking Cessation, Tips for Success If you are ready to quit smoking, congratulations! You have chosen to help yourself be healthier. Cigarettes bring nicotine, tar, carbon monoxide, and other irritants into your body. Your lungs, heart, and blood vessels will be able to work better without these poisons. There are many different ways to quit smoking. Nicotine gum, nicotine patches, a nicotine inhaler, or nicotine nasal spray can help with physical craving. Hypnosis, support groups, and medicines help break the habit of smoking. WHAT THINGS CAN I DO TO MAKE QUITTING EASIER?  Here are some tips to help you quit for good:  Pick a date when you will quit smoking completely. Tell all of your friends and family about your plan to quit on that date.  Do not try to slowly cut down on the number of cigarettes you are smoking. Pick a quit date and quit smoking completely starting on that day.  Throw away all cigarettes.   Clean and remove all ashtrays from your home, work, and car.   On a card, write down your reasons for quitting. Carry the card with you and read it when you get the urge to smoke.   Cleanse your body of nicotine. Drink enough water and fluids to keep your urine clear or pale yellow. Do this after quitting to flush the nicotine from your body.   Learn to predict your moods. Do not let a bad situation be your excuse to have a cigarette. Some situations in your life might tempt you into wanting a cigarette.   Never have "just one" cigarette. It leads to wanting another and another. Remind yourself of your decision to quit.   Change habits associated with smoking. If you smoked while driving or when feeling stressed, try other activities to replace smoking. Stand up when drinking your coffee. Brush your teeth after eating. Sit in a different chair when you read the paper. Avoid alcohol while trying to quit, and try to drink fewer caffeinated beverages. Alcohol and caffeine may urge  you to smoke.   Avoid foods and drinks that can trigger a desire to smoke, such as sugary or spicy foods and alcohol.   Ask people who smoke not to smoke around you.   Have something planned to do right after eating or having a cup of coffee. For example, plan to take a walk or exercise.   Try a relaxation exercise to calm you down and decrease your stress. Remember, you may be tense and nervous for the first 2 weeks after you quit, but this will pass.   Find new activities to keep your hands busy. Play with a pen, coin, or rubber band. Doodle or draw things on paper.   Brush your teeth right after eating. This will help cut down on the craving for the taste of tobacco after meals. You can also try mouthwash.   Use oral substitutes in place of cigarettes. Try using lemon drops, carrots, cinnamon sticks, or chewing gum. Keep them handy so they are available when you have the urge to smoke.   When you have the urge to smoke, try deep breathing.   Designate your home as a nonsmoking area.   If you are a heavy smoker, ask your health care provider about a prescription for nicotine chewing gum. It can ease your withdrawal from nicotine.   Reward yourself. Set aside the cigarette money you save and buy yourself something nice.   Look for support from others. Join a support group or   smoking cessation program. Ask someone at home or at work to help you with your plan to quit smoking.   Always ask yourself, "Do I need this cigarette or is this just a reflex?" Tell yourself, "Today, I choose not to smoke," or "I do not want to smoke." You are reminding yourself of your decision to quit.  Do not replace cigarette smoking with electronic cigarettes (commonly called e-cigarettes). The safety of e-cigarettes is unknown, and some may contain harmful chemicals.  If you relapse, do not give up! Plan ahead and think about what you will do the next time you get the urge to smoke.  HOW WILL  I FEEL WHEN I QUIT SMOKING? You may have symptoms of withdrawal because your body is used to nicotine (the addictive substance in cigarettes). You may crave cigarettes, be irritable, feel very hungry, cough often, get headaches, or have difficulty concentrating. The withdrawal symptoms are only temporary. They are strongest when you first quit but will go away within 10 14 days. When withdrawal symptoms occur, stay in control. Think about your reasons for quitting. Remind yourself that these are signs that your body is healing and getting used to being without cigarettes. Remember that withdrawal symptoms are easier to treat than the major diseases that smoking can cause.  Even after the withdrawal is over, expect periodic urges to smoke. However, these cravings are generally short lived and will go away whether you smoke or not. Do not smoke!  WHAT RESOURCES ARE AVAILABLE TO HELP ME QUIT SMOKING? Your health care provider can direct you to community resources or hospitals for support, which may include:  Group support.  Education.  Hypnosis.  Therapy. Document Released: 03/26/2004 Document Revised: 04/18/2013 Document Reviewed: 12/14/2012 [employer] Patient Information 2014 [employer].  

## 2013-11-02 ENCOUNTER — Telehealth: Payer: Self-pay | Admitting: Internal Medicine

## 2013-11-02 ENCOUNTER — Encounter: Payer: Self-pay | Admitting: Internal Medicine

## 2013-11-02 NOTE — Telephone Encounter (Signed)
s.w.l pt and advised on July appt....pt ok and aware

## 2013-11-14 ENCOUNTER — Telehealth: Payer: Self-pay | Admitting: Dietician

## 2013-11-14 NOTE — Telephone Encounter (Signed)
Brief Outpatient Oncology Nutrition Note  Patient has been identified to be at risk on malnutrition screen.  Wt Readings from Last 10 Encounters:  11/01/13 159 lb 9.6 oz (72.394 kg)  08/01/13 159 lb 9.6 oz (72.394 kg)  05/10/13 164 lb 3.2 oz (74.481 kg)  04/18/13 159 lb (72.122 kg)  02/07/13 159 lb 8 oz (72.349 kg)  01/04/13 162 lb 4.8 oz (73.619 kg)  01/03/13 166 lb (75.297 kg)  12/14/12 165 lb 11.2 oz (75.161 kg)  11/23/12 169 lb 4.8 oz (76.794 kg)  11/09/12 166 lb 4.8 oz (75.433 kg)    Dx:  Stage IIA non-small cell lung cancer, squamous cell carcinoma diagnosed in December of 2013.  Patient of [name].    Called patient who reports very good intake.  Weight loss secondary to working outside more.  Eats at least 2 meals and multiple snacks daily.    [place] RD available as needed.  Antonieta Iba, RD, LDN

## 2014-01-07 ENCOUNTER — Telehealth: Payer: Self-pay

## 2014-01-07 MED ORDER — ISOSORBIDE MONONITRATE ER 30 MG PO TB24: 30.0000 mg | ORAL_TABLET | Freq: Every day | ORAL | Status: DC

## 2014-01-07 NOTE — Telephone Encounter (Signed)
Refilled. Msg sent to scheduler for pt to be scheduled for appt with Dr. Irish Lack asap!

## 2014-01-31 ENCOUNTER — Ambulatory Visit (HOSPITAL_COMMUNITY)
Admission: RE | Admit: 2014-01-31 | Discharge: 2014-01-31 | Disposition: A | Discharge status: Home / Self Care | Payer: Medicare Other | Source: Ambulatory Visit | Attending: Internal Medicine | Admitting: Internal Medicine

## 2014-01-31 ENCOUNTER — Encounter (HOSPITAL_COMMUNITY): Payer: Self-pay

## 2014-01-31 ENCOUNTER — Other Ambulatory Visit (HOSPITAL_BASED_OUTPATIENT_CLINIC_OR_DEPARTMENT_OTHER): Payer: Medicare Other

## 2014-01-31 DIAGNOSIS — K668 Other specified disorders of peritoneum: Secondary | ICD-10-CM | POA: Diagnosis not present

## 2014-01-31 DIAGNOSIS — Z4789 Encounter for other orthopedic aftercare: Secondary | ICD-10-CM | POA: Diagnosis not present

## 2014-01-31 DIAGNOSIS — Z902 Acquired absence of lung [part of]: Secondary | ICD-10-CM | POA: Insufficient documentation

## 2014-01-31 DIAGNOSIS — C3490 Malignant neoplasm of unspecified part of unspecified bronchus or lung: Secondary | ICD-10-CM

## 2014-01-31 DIAGNOSIS — I700 Atherosclerosis of aorta: Secondary | ICD-10-CM | POA: Diagnosis not present

## 2014-01-31 DIAGNOSIS — G4700 Insomnia, unspecified: Secondary | ICD-10-CM

## 2014-01-31 DIAGNOSIS — R0602 Shortness of breath: Secondary | ICD-10-CM | POA: Insufficient documentation

## 2014-01-31 DIAGNOSIS — Z9221 Personal history of antineoplastic chemotherapy: Secondary | ICD-10-CM | POA: Diagnosis not present

## 2014-01-31 DIAGNOSIS — I2510 Atherosclerotic heart disease of native coronary artery without angina pectoris: Secondary | ICD-10-CM | POA: Diagnosis not present

## 2014-01-31 DIAGNOSIS — J438 Other emphysema: Secondary | ICD-10-CM | POA: Diagnosis not present

## 2014-01-31 DIAGNOSIS — C3410 Malignant neoplasm of upper lobe, unspecified bronchus or lung: Secondary | ICD-10-CM

## 2014-01-31 LAB — COMPREHENSIVE METABOLIC PANEL (CC13)
ALBUMIN: 3.5 g/dL (ref 3.5–5.0)
ALK PHOS: 66 U/L (ref 40–150)
ALT: 17 U/L (ref 0–55)
AST: 16 U/L (ref 5–34)
Anion Gap: 6 mEq/L (ref 3–11)
BUN: 8.9 mg/dL (ref 7.0–26.0)
CALCIUM: 8.9 mg/dL (ref 8.4–10.4)
CHLORIDE: 102 meq/L (ref 98–109)
CO2: 29 mEq/L (ref 22–29)
Creatinine: 0.8 mg/dL (ref 0.7–1.3)
GLUCOSE: 94 mg/dL (ref 70–140)
POTASSIUM: 4.4 meq/L (ref 3.5–5.1)
SODIUM: 137 meq/L (ref 136–145)
Total Bilirubin: 0.29 mg/dL (ref 0.20–1.20)
Total Protein: 6.5 g/dL (ref 6.4–8.3)

## 2014-01-31 LAB — CBC WITH DIFFERENTIAL/PLATELET
BASO%: 0.9 % (ref 0.0–2.0)
Basophils Absolute: 0.1 10*3/uL (ref 0.0–0.1)
EOS%: 2.3 % (ref 0.0–7.0)
Eosinophils Absolute: 0.2 10*3/uL (ref 0.0–0.5)
HCT: 43.1 % (ref 38.4–49.9)
HGB: 14.1 g/dL (ref 13.0–17.1)
LYMPH%: 40.2 % (ref 14.0–49.0)
MCH: 32.2 pg (ref 27.2–33.4)
MCHC: 32.6 g/dL (ref 32.0–36.0)
MCV: 98.7 fL — ABNORMAL HIGH (ref 79.3–98.0)
MONO#: 0.6 10*3/uL (ref 0.1–0.9)
MONO%: 8.7 % (ref 0.0–14.0)
NEUT#: 3.3 10*3/uL (ref 1.5–6.5)
NEUT%: 47.9 % (ref 39.0–75.0)
Platelets: 226 10*3/uL (ref 140–400)
RBC: 4.37 10*6/uL (ref 4.20–5.82)
RDW: 14.2 % (ref 11.0–14.6)
WBC: 6.9 10*3/uL (ref 4.0–10.3)
lymph#: 2.8 10*3/uL (ref 0.9–3.3)

## 2014-01-31 MED ORDER — IOHEXOL 300 MG/ML  SOLN
80.0000 mL | Freq: Once | INTRAMUSCULAR | Status: AC | PRN
  Administered 2014-01-31: 80 mL via INTRAVENOUS

## 2014-02-07 ENCOUNTER — Ambulatory Visit (HOSPITAL_BASED_OUTPATIENT_CLINIC_OR_DEPARTMENT_OTHER): Payer: Medicare Other | Admitting: Internal Medicine

## 2014-02-07 ENCOUNTER — Encounter: Payer: Self-pay | Admitting: Internal Medicine

## 2014-02-07 VITALS — BP 103/82 | HR 94 | Temp 98.0°F | Resp 19 | Ht 69.0 in | Wt 163.7 lb

## 2014-02-07 DIAGNOSIS — C3411 Malignant neoplasm of upper lobe, right bronchus or lung: Secondary | ICD-10-CM

## 2014-02-07 DIAGNOSIS — F17200 Nicotine dependence, unspecified, uncomplicated: Secondary | ICD-10-CM

## 2014-02-07 DIAGNOSIS — C3410 Malignant neoplasm of upper lobe, unspecified bronchus or lung: Secondary | ICD-10-CM

## 2014-02-07 NOTE — Progress Notes (Signed)
[facility] Telephone:([phone]) [phone]   Fax:(336) [phone]  OFFICE PROGRESS NOTE  Darren Miyamoto, MD [place] [address]  DIAGNOSIS: Stage IIA (T2 B., N0, M0) non-small cell lung cancer, squamous cell carcinoma diagnosed in December of 2013   PRIOR THERAPY:  1) Status post right upper lobectomy under the care of Dr. Prescott Gum on 09/14/2012.  2) Adjuvant chemotherapy with cisplatin 75 mg/M2 and docetaxel 75 mg/M2 with Neulasta support every 3 weeks, status post 1 cycle. Starting with cycle #2, patient will receive carboplatin for an AUC initially of 4.5 and paclitaxel at 175 mg per meter squared with Neulasta support given every 3 weeks. Status post a total of 3 cycles.   CURRENT THERAPY: Observation.   CHEMOTHERAPY INTENT: Curative  CURRENT # OF CHEMOTHERAPY CYCLES: 0  CURRENT ANTIEMETICS: Zofran, dexamethasone and Compazine  CURRENT SMOKING STATUS: Current smoker and was strongly advised to quit smoking and altered smoke cessation program.  ORAL CHEMOTHERAPY AND CONSENT: None  CURRENT BISPHOSPHONATES USE: None  PAIN MANAGEMENT: 3/10 controlled by Norco when necessary  NARCOTICS INDUCED CONSTIPATION: Milk of magnesia on as-needed basis.  LIVING WILL AND CODE STATUS: No CODE BLUE.   INTERVAL HISTORY:  [name] 73 y.o. male returns to the clinic today for followup visit accompanied his wife. He has been observation for the last 2 years with no significant complaints. He denied having any significant nausea or vomiting. He denied having any chest pain, shortness of breath, cough or hemoptysis. He has no significant weight loss or night sweats. No fever or chills but He continues to smoke and is unwilling to quit. The patient had repeat CT scan of the chest performed recently and he is here for evaluation and discussion of his scan results.   MEDICAL HISTORY: Past Medical History  Diagnosis Date  . Arthritis   . Wheezing   . Sore throat   .  Carotid artery occlusion   . COPD (chronic obstructive pulmonary disease)   . Lumbar disc disease   . Restless leg syndrome   . Allergic rhinitis   . Hypertension   . Anxiety     anxiety  . Shortness of breath   . Lung mass   . Pneumonia     ? now  . GERD (gastroesophageal reflux disease)   . H/O hiatal hernia   . Cancer     lung mass  . Pre-operative cardiovascular examination   . Nonspecific abnormal unspecified cardiovascular function study   . Peripheral vascular disease, unspecified     ALLERGIES:  has No Known Allergies.  MEDICATIONS:  Current Outpatient Prescriptions  Medication Sig Dispense Refill  . aspirin EC 81 MG tablet Take 81 mg by mouth daily.      . clonazePAM (KLONOPIN) 0.5 MG tablet Take 0.5 mg by mouth 2 (two) times daily as needed for anxiety.      Marland Kitchen HYDROcodone-acetaminophen (NORCO) 7.5-325 MG per tablet Take 1 tablet by mouth every 4 (four) hours as needed for pain.  40 tablet  0  . isosorbide mononitrate (IMDUR) 30 MG 24 hr tablet Take 1 tablet (30 mg total) by mouth daily.  30 tablet  0  . omeprazole (PRILOSEC) 40 MG capsule Take 40 mg by mouth daily.      Marland Kitchen OVER THE COUNTER MEDICATION       . pramipexole (MIRAPEX) 0.5 MG tablet Take 1-1.5 mg by mouth at bedtime as needed. Restless legs  No current facility-administered medications for this visit.    SURGICAL HISTORY:  Past Surgical History  Procedure Laterality Date  . Carotid endarterectomy  10/15/2010    left  . Hand surgery      repair of the left long, ring, and small finger after a saw injury  . Video bronchoscopy  07/24/2012    Procedure: VIDEO BRONCHOSCOPY WITH FLUORO;  Surgeon: Kathee Delton, MD;  Location: [facility];  Service: Cardiopulmonary;  Laterality: Bilateral;  . Lobectomy Right 09/14/2012    Procedure: LOBECTOMY;  Surgeon: Ivin Poot, MD;  Location: [facility];  Service: Thoracic;  Laterality: Right;  RIGHT UPPER LOBECTOMY  . Video assisted thoracoscopy Right 09/14/2012     Procedure: VIDEO ASSISTED THORACOSCOPY;  Surgeon: Ivin Poot, MD;  Location: [facility];  Service: Thoracic;  Laterality: Right;    REVIEW OF SYSTEMS:  A comprehensive review of systems was negative.   PHYSICAL EXAMINATION: General appearance: alert, cooperative and no distress Head: Normocephalic, without obvious abnormality, atraumatic Neck: no adenopathy, no JVD, supple, symmetrical, trachea midline and thyroid not enlarged, symmetric, no tenderness/mass/nodules Lymph nodes: Cervical, supraclavicular, and axillary nodes normal. Resp: clear to auscultation bilaterally Back: symmetric, no curvature. ROM normal. No CVA tenderness. Cardio: regular rate and rhythm, S1, S2 normal, no murmur, click, rub or gallop GI: soft, non-tender; bowel sounds normal; no masses,  no organomegaly Extremities: extremities normal, atraumatic, no cyanosis or edema  ECOG PERFORMANCE STATUS: 1 - Symptomatic but completely ambulatory  Blood pressure 103/82, pulse 94, temperature 98 F (36.7 C), temperature source Oral, resp. rate 19, height 5\' 9"  (1.753 m), weight 163 lb 11.2 oz (74.254 kg).  LABORATORY DATA: Lab Results  Component Value Date   WBC 6.9 01/31/2014   HGB 14.1 01/31/2014   HCT 43.1 01/31/2014   MCV 98.7* 01/31/2014   PLT 226 01/31/2014      Chemistry      Component Value Date/Time   NA 137 01/31/2014 0834   NA 134* 09/19/2012 0356   K 4.4 01/31/2014 0834   K 3.3* 09/19/2012 0356   CL 101 12/28/2012 0852   CL 96 09/19/2012 0356   CO2 29 01/31/2014 0834   CO2 32 09/19/2012 0356   BUN 8.9 01/31/2014 0834   BUN 9 09/19/2012 0356   CREATININE 0.8 01/31/2014 0834   CREATININE 0.65 09/19/2012 0356      Component Value Date/Time   CALCIUM 8.9 01/31/2014 0834   CALCIUM 8.9 09/19/2012 0356   ALKPHOS 66 01/31/2014 0834   ALKPHOS 80 09/16/2012 0420   AST 16 01/31/2014 0834   AST 35 09/16/2012 0420   ALT 17 01/31/2014 0834   ALT 19 09/16/2012 0420   BILITOT 0.29 01/31/2014 0834   BILITOT 0.4 09/16/2012 0420        RADIOGRAPHIC STUDIES: Ct Chest W Contrast  01/31/2014   CLINICAL DATA:  History of lung cancer status post right upper lobectomy. Chemotherapy now complete. Shortness breath.  EXAM: CT CHEST WITH CONTRAST  TECHNIQUE: Multidetector CT imaging of the chest was performed during intravenous contrast administration.  CONTRAST:  38mL OMNIPAQUE IOHEXOL 300 MG/ML  SOLN  COMPARISON:  Chest CT 10/30/2013.  FINDINGS: Mediastinum: Heart size is normal. There is no significant pericardial fluid, thickening or pericardial calcification. There is atherosclerosis of the thoracic aorta, the great vessels of the mediastinum and the coronary arteries, including calcified atherosclerotic plaque in the left main, left anterior descending and right coronary arteries. No pathologically enlarged mediastinal or hilar lymph nodes. Esophagus  is unremarkable in appearance.  Lungs/Pleura: Status post right upper lobectomy. Compensatory hyperexpansion of the right middle and lower lobes. No suspicious appearing pulmonary nodules or masses. Mild diffuse bronchial wall thickening. Mild centrilobular emphysema. Patchy areas of pleural thickening, most pronounced in the lung apices, most compatible with areas of pleural parenchymal scarring from prior infection or inflammation. No confluent consolidative airspace disease. No pleural effusions.  Upper Abdomen: Calcified granuloma in the medial aspect of the spleen incidentally noted. Otherwise, unremarkable.  Musculoskeletal: Multiple old healed right-sided rib fractures. There are no aggressive appearing lytic or blastic lesions noted in the visualized portions of the skeleton.  IMPRESSION: 1. Status post right upper lobectomy without evidence to suggest local recurrence of disease or new metastatic disease in the thorax. 2. Previously noted prominent high right paratracheal lymph nodes have regressed in size and are presumably benign. 3. Atherosclerosis, including left main and 2 vessel  coronary artery disease. Assessment for potential risk factor modification, dietary therapy or pharmacologic therapy may be warranted, if clinically indicated. 4. Mild diffuse bronchial wall thickening with mild centrilobular emphysema.   Electronically Signed   By: Vinnie Langton M.D.   On: 01/31/2014 10:15   ASSESSMENT AND PLAN: This is a very pleasant 73 years old white male with history of stage IIa non-small cell lung cancer status post right upper lobectomy followed by 4 cycles of adjuvant chemotherapy.  His recent CT scan of the chest showed no evidence for disease recurrence. I discussed the scan results with the patient and his family.  I recommended for him to continue on observation with repeat CT scan of the chest in 6 months.  For smoke cessation, I strongly encouraged the patient to quit smoking and offered him to smoke cessation program but he declined. The patient has evidence for coronary atherosclerosis on the recent scan and he is scheduled to see Dr. Irish Lack, a cardiologist in the next few weeks. He was advised to call immediately if he has any concerning symptoms in the interval.  The patient voices understanding of current disease status and treatment options and is in agreement with the current care plan.  All questions were answered. The patient knows to call the clinic with any problems, questions or concerns. We can certainly see the patient much sooner if necessary.  Disclaimer: This note was dictated with voice recognition software. Similar sounding words can inadvertently be transcribed and may not be corrected upon review.

## 2014-02-07 NOTE — Patient Instructions (Signed)
Smoking Cessation Quitting smoking is important to your health and has many advantages. However, it is not always easy to quit since nicotine is a very addictive drug. Oftentimes, people try 3 times or more before being able to quit. This document explains the best ways for you to prepare to quit smoking. Quitting takes hard work and a lot of effort, but you can do it. ADVANTAGES OF QUITTING SMOKING  You will live longer, feel better, and live better.  Your body will feel the impact of quitting smoking almost immediately.  Within 20 minutes, blood pressure decreases. Your pulse returns to its normal level.  After 8 hours, carbon monoxide levels in the blood return to normal. Your oxygen level increases.  After 24 hours, the chance of having a heart attack starts to decrease. Your breath, hair, and body stop smelling like smoke.  After 48 hours, damaged nerve endings begin to recover. Your sense of taste and smell improve.  After 72 hours, the body is virtually free of nicotine. Your bronchial tubes relax and breathing becomes easier.  After 2 to 12 weeks, lungs can hold more air. Exercise becomes easier and circulation improves.  The risk of having a heart attack, stroke, cancer, or lung disease is greatly reduced.  After 1 year, the risk of coronary heart disease is cut in half.  After 5 years, the risk of stroke falls to the same as a nonsmoker.  After 10 years, the risk of lung cancer is cut in half and the risk of other cancers decreases significantly.  After 15 years, the risk of coronary heart disease drops, usually to the level of a nonsmoker.  If you are pregnant, quitting smoking will improve your chances of having a healthy baby.  The people you live with, especially any children, will be healthier.  You will have extra money to spend on things other than cigarettes. QUESTIONS TO THINK ABOUT BEFORE ATTEMPTING TO QUIT You may want to talk about your answers with your  health care provider.  Why do you want to quit?  If you tried to quit in the past, what helped and what did not?  What will be the most difficult situations for you after you quit? How will you plan to handle them?  Who can help you through the tough times? Your family? Friends? A health care provider?  What pleasures do you get from smoking? What ways can you still get pleasure if you quit? Here are some questions to ask your health care provider:  How can you help me to be successful at quitting?  What medicine do you think would be best for me and how should I take it?  What should I do if I need more help?  What is smoking withdrawal like? How can I get information on withdrawal? GET READY  Set a quit date.  Change your environment by getting rid of all cigarettes, ashtrays, matches, and lighters in your home, car, or work. Do not let people smoke in your home.  Review your past attempts to quit. Think about what worked and what did not. GET SUPPORT AND ENCOURAGEMENT You have a better chance of being successful if you have help. You can get support in many ways.  Tell your family, friends, and coworkers that you are going to quit and need their support. Ask them not to smoke around you.  Get individual, group, or telephone counseling and support. Programs are available at local hospitals and health centers. Call   your local health department for information about programs in your area.  Spiritual beliefs and practices may help some smokers quit.  Download a "quit meter" on your computer to keep track of quit statistics, such as how long you have gone without smoking, cigarettes not smoked, and money saved.  Get a self-help book about quitting smoking and staying off tobacco. LEARN NEW SKILLS AND BEHAVIORS  Distract yourself from urges to smoke. Talk to someone, go for a walk, or occupy your time with a task.  Change your normal routine. Take a different route to work.  Drink tea instead of coffee. Eat breakfast in a different place.  Reduce your stress. Take a hot bath, exercise, or read a book.  Plan something enjoyable to do every day. Reward yourself for not smoking.  Explore interactive web-based programs that specialize in helping you quit. GET MEDICINE AND USE IT CORRECTLY Medicines can help you stop smoking and decrease the urge to smoke. Combining medicine with the above behavioral methods and support can greatly increase your chances of successfully quitting smoking.  Nicotine replacement therapy helps deliver nicotine to your body without the negative effects and risks of smoking. Nicotine replacement therapy includes nicotine gum, lozenges, inhalers, nasal sprays, and skin patches. Some may be available over-the-counter and others require a prescription.  Antidepressant medicine helps people abstain from smoking, but how this works is unknown. This medicine is available by prescription.  Nicotinic receptor partial agonist medicine simulates the effect of nicotine in your brain. This medicine is available by prescription. Ask your health care provider for advice about which medicines to use and how to use them based on your health history. Your health care provider will tell you what side effects to look out for if you choose to be on a medicine or therapy. Carefully read the information on the package. Do not use any other product containing nicotine while using a nicotine replacement product.  RELAPSE OR DIFFICULT SITUATIONS Most relapses occur within the first 3 months after quitting. Do not be discouraged if you start smoking again. Remember, most people try several times before finally quitting. You may have symptoms of withdrawal because your body is used to nicotine. You may crave cigarettes, be irritable, feel very hungry, cough often, get headaches, or have difficulty concentrating. The withdrawal symptoms are only temporary. They are strongest  when you first quit, but they will go away within 10-14 days. To reduce the chances of relapse, try to:  Avoid drinking alcohol. Drinking lowers your chances of successfully quitting.  Reduce the amount of caffeine you consume. Once you quit smoking, the amount of caffeine in your body increases and can give you symptoms, such as a rapid heartbeat, sweating, and anxiety.  Avoid smokers because they can make you want to smoke.  Do not let weight gain distract you. Many smokers will gain weight when they quit, usually less than 10 pounds. Eat a healthy diet and stay active. You can always lose the weight gained after you quit.  Find ways to improve your mood other than smoking. FOR MORE INFORMATION  www.smokefree.gov  Document Released: 06/22/2001 Document Revised: 11/12/2013 Document Reviewed: 10/07/2011 Exit[employer] Patient Information 2015 [employer]. This information is not intended to replace advice given to you by your health care provider. Make sure you discuss any questions you have with your health care provider.  

## 2014-02-08 ENCOUNTER — Telehealth: Payer: Self-pay | Admitting: Internal Medicine

## 2014-02-08 NOTE — Telephone Encounter (Signed)
lvm for pt regarding to Jan and Feb 2016....mailed pt appt sched/avs and letter

## 2014-02-12 ENCOUNTER — Other Ambulatory Visit: Payer: Self-pay

## 2014-02-12 MED ORDER — ISOSORBIDE MONONITRATE ER 30 MG PO TB24: 30.0000 mg | ORAL_TABLET | Freq: Every day | ORAL | Status: DC

## 2014-03-07 ENCOUNTER — Ambulatory Visit (INDEPENDENT_AMBULATORY_CARE_PROVIDER_SITE_OTHER): Payer: Medicare Other | Admitting: Interventional Cardiology

## 2014-03-07 ENCOUNTER — Encounter: Payer: Self-pay | Admitting: Interventional Cardiology

## 2014-03-07 VITALS — BP 106/82 | HR 73 | Ht 69.0 in | Wt 167.2 lb

## 2014-03-07 DIAGNOSIS — R0602 Shortness of breath: Secondary | ICD-10-CM

## 2014-03-07 DIAGNOSIS — I6529 Occlusion and stenosis of unspecified carotid artery: Secondary | ICD-10-CM

## 2014-03-07 DIAGNOSIS — I10 Essential (primary) hypertension: Secondary | ICD-10-CM | POA: Insufficient documentation

## 2014-03-07 DIAGNOSIS — I739 Peripheral vascular disease, unspecified: Secondary | ICD-10-CM

## 2014-03-07 DIAGNOSIS — I2510 Atherosclerotic heart disease of native coronary artery without angina pectoris: Secondary | ICD-10-CM

## 2014-03-07 DIAGNOSIS — F17200 Nicotine dependence, unspecified, uncomplicated: Secondary | ICD-10-CM

## 2014-03-07 LAB — BRAIN NATRIURETIC PEPTIDE: PRO B NATRI PEPTIDE: 18 pg/mL (ref 0.0–100.0)

## 2014-03-07 LAB — BASIC METABOLIC PANEL
BUN: 9 mg/dL (ref 6–23)
CALCIUM: 9.2 mg/dL (ref 8.4–10.5)
CHLORIDE: 96 meq/L (ref 96–112)
CO2: 32 mEq/L (ref 19–32)
CREATININE: 0.8 mg/dL (ref 0.4–1.5)
GFR: 105.89 mL/min (ref >60.00)
Glucose, Bld: 82 mg/dL (ref 70–99)
Potassium: 4.2 mEq/L (ref 3.5–5.1)
Sodium: 133 mEq/L — ABNORMAL LOW (ref 135–145)

## 2014-03-07 MED ORDER — ISOSORBIDE MONONITRATE ER 30 MG PO TB24: 30.0000 mg | ORAL_TABLET | Freq: Every day | ORAL | Status: DC

## 2014-03-07 MED ORDER — NITROGLYCERIN 0.4 MG SL SUBL: 0.4000 mg | SUBLINGUAL_TABLET | SUBLINGUAL | Status: DC | PRN

## 2014-03-07 NOTE — Patient Instructions (Addendum)
Your physician recommends that you return for lab work today for BNP and Bmet.   Your physician has recommended you make the following change in your medication:   1. Use nitroglycerin as needed for Chest Pain.  Refilled Imdur and Nitroglycerin to [facility].  Your physician wants you to follow-up in: 1 year with Dr. Irish Lack. You will receive a reminder letter in the mail two months in advance. If you don't receive a letter, please call our office to schedule the follow-up appointment.

## 2014-03-07 NOTE — Progress Notes (Signed)
Patient ID: [name], male   DOB: 1940-11-24, 73 y.o.   MRN: [id number]    [place], [place], [place]  [address] Phone: [phone] Fax:  [phone]  Date:  03/07/2014   ID:  [name], DOB [birth year], MRN [id number]  PCP:  [name], MD      History of Present Illness: [name] is a 73 y.o. male with peripheral vascular disease who had lung cancer- treated with surgery. At that time he had a stress test with an inferior wall defect.  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 He was managed medically. He has not had any significant angina.  [name] functional capacity is significantly decreased now. He feels fatigued. He does not walk up stairs. His activity level has dropped off. He occasionally has some dizziness. He has some tingling in his right hand. He does not have chest discomfort at this time but does have worsening shortness of breath.    Wt Readings from Last 3 Encounters:  03/07/14 167 lb 3.2 oz (75.841 kg)  02/07/14 163 lb 11.2 oz (74.254 kg)  11/01/13 159 lb 9.6 oz (72.394 kg)     Past Medical History  Diagnosis Date  . Arthritis   . Wheezing   . Sore throat   . Carotid artery occlusion   . COPD (chronic obstructive pulmonary disease)   . Lumbar disc disease   . Restless leg syndrome   . Allergic rhinitis   . Hypertension   . Anxiety     anxiety  . Shortness of breath   . Lung mass   . Pneumonia     ? now  . GERD (gastroesophageal reflux disease)   . H/O hiatal hernia   . Cancer     lung mass  . Pre-operative cardiovascular examination   . Nonspecific abnormal unspecified cardiovascular function study   . Peripheral vascular disease, unspecified     Current Outpatient Prescriptions  Medication Sig Dispense Refill  . aspirin EC 81 MG tablet Take 81 mg by mouth daily. Takes one tablet every evening.      . clonazePAM (KLONOPIN) 0.5 MG tablet Take 0.5 mg by mouth 2 (two) times daily as needed for anxiety. Takes one  tablet by mouth two times daily as needed for anxiety.      . diphenhydramine-acetaminophen (TYLENOL PM) 25-500 MG TABS Take 1 tablet by mouth at bedtime as needed.      Marland Kitchen HYDROcodone-acetaminophen (NORCO) 7.5-325 MG per tablet Take 1 tablet by mouth every 4 (four) hours as needed for pain.  40 tablet  0  . isosorbide mononitrate (IMDUR) 30 MG 24 hr tablet Take 1 tablet (30 mg total) by mouth daily.  30 tablet  0  . omeprazole (PRILOSEC) 40 MG capsule Take 40 mg by mouth daily.      . pramipexole (MIRAPEX) 1 MG tablet Take 1 mg by mouth at bedtime.       No current facility-administered medications for this visit.    Allergies:   No Known Allergies  Social History:  The patient  reports that he has been smoking Cigarettes.  He has a 55 pack-year smoking history. He quit smokeless tobacco use about 61 years ago. He reports that he drinks about 4.2 ounces of alcohol per week. He reports that he does not use illicit drugs.   Family History:  [name] family history includes Alcohol abuse in his mother; Heart disease in his brother  and father; Stroke in his brother.   ROS:  Please see the history of present illness.  No nausea, vomiting.  No fevers, chills.  No focal weakness.  No dysuria.   All other systems reviewed and negative.   PHYSICAL EXAM: VS:  BP 106/82  Pulse 73  Ht 5\' 9"  (1.753 m)  Wt 167 lb 3.2 oz (75.841 kg)  BMI 24.68 kg/m2 Well nourished, well developed, in no acute distress HEENT: normal Neck: no JVD, no carotid bruits Cardiac:  normal S1, S2; RRR;  Lungs:  clear to auscultation bilaterally, no wheezing, rhonchi or rales Abd: soft, nontender, no hepatomegaly Ext: no edema3+ left radial pulse, 1+ right radial pulse Skin: warm and dry Neuro:   no focal abnormalities noted  EKG:  NSR, NSST    ASSESSMENT AND PLAN:  1. CAD: No clear angina at this time.  COntinue medical therapy.  Rx for Imdur and SL NTG given.  2. SHOB: Multifactorial.  He continues to smoke.  He  has a lot of phlegm this time of year as well.  I am not convinced that this is angina.  Will check BNP.   3. PAD: Decreased right radial pulse compared to the left.  He states BP is usually lowe in the right than in the left.  No subclavian steal sx or arm pain with exertion.  WOuld continue to monitor.   4. Smoking: He neds to stop smoking, for many reasons including his lung cancer and CAD.    Signed, Mina Marble, MD, [facility] 03/07/2014 9:00 AM

## 2014-04-30 ENCOUNTER — Encounter: Payer: Self-pay | Admitting: Family

## 2014-05-01 ENCOUNTER — Ambulatory Visit (HOSPITAL_COMMUNITY)
Admission: RE | Admit: 2014-05-01 | Discharge: 2014-05-01 | Disposition: A | Discharge status: Home / Self Care | Payer: Medicare Other | Source: Ambulatory Visit | Attending: Family | Admitting: Family

## 2014-05-01 ENCOUNTER — Encounter: Payer: Self-pay | Admitting: Family

## 2014-05-01 ENCOUNTER — Ambulatory Visit (INDEPENDENT_AMBULATORY_CARE_PROVIDER_SITE_OTHER): Payer: Medicare Other | Admitting: Family

## 2014-05-01 VITALS — BP 118/70 | HR 84 | Resp 16 | Ht 69.5 in | Wt 168.0 lb

## 2014-05-01 DIAGNOSIS — Z48812 Encounter for surgical aftercare following surgery on the circulatory system: Secondary | ICD-10-CM | POA: Diagnosis not present

## 2014-05-01 DIAGNOSIS — I6529 Occlusion and stenosis of unspecified carotid artery: Secondary | ICD-10-CM | POA: Insufficient documentation

## 2014-05-01 DIAGNOSIS — R531 Weakness: Secondary | ICD-10-CM | POA: Insufficient documentation

## 2014-05-01 DIAGNOSIS — I6523 Occlusion and stenosis of bilateral carotid arteries: Secondary | ICD-10-CM

## 2014-05-01 NOTE — Patient Instructions (Signed)
Stroke Prevention Some medical conditions and behaviors are associated with an increased chance of having a stroke. You may prevent a stroke by making healthy choices and managing medical conditions. HOW CAN I REDUCE MY RISK OF HAVING A STROKE?   Stay physically active. Get at least 30 minutes of activity on most or all days.  Do not smoke. It may also be helpful to avoid exposure to secondhand smoke.  Limit alcohol use. Moderate alcohol use is considered to be:  No more than 2 drinks per day for men.  No more than 1 drink per day for nonpregnant women.  Eat healthy foods. This involves:  Eating 5 or more servings of fruits and vegetables a day.  Making dietary changes that address high blood pressure (hypertension), high cholesterol, diabetes, or obesity.  Manage your cholesterol levels.  Making food choices that are high in fiber and low in saturated fat, trans fat, and cholesterol may control cholesterol levels.  Take any prescribed medicines to control cholesterol as directed by your health care provider.  Manage your diabetes.  Controlling your carbohydrate and sugar intake is recommended to manage diabetes.  Take any prescribed medicines to control diabetes as directed by your health care provider.  Control your hypertension.  Making food choices that are low in salt (sodium), saturated fat, trans fat, and cholesterol is recommended to manage hypertension.  Take any prescribed medicines to control hypertension as directed by your health care provider.  Maintain a healthy weight.  Reducing calorie intake and making food choices that are low in sodium, saturated fat, trans fat, and cholesterol are recommended to manage weight.  Stop drug abuse.  Avoid taking birth control pills.  Talk to your health care provider about the risks of taking birth control pills if you are over 35 years old, smoke, get migraines, or have ever had a blood clot.  Get evaluated for sleep  disorders (sleep apnea).  Talk to your health care provider about getting a sleep evaluation if you snore a lot or have excessive sleepiness.  Take medicines only as directed by your health care provider.  For some people, aspirin or blood thinners (anticoagulants) are helpful in reducing the risk of forming abnormal blood clots that can lead to stroke. If you have the irregular heart rhythm of atrial fibrillation, you should be on a blood thinner unless there is a good reason you cannot take them.  Understand all your medicine instructions.  Make sure that other conditions (such as anemia or atherosclerosis) are addressed. SEEK IMMEDIATE MEDICAL CARE IF:   You have sudden weakness or numbness of the face, arm, or leg, especially on one side of the body.  Your face or eyelid droops to one side.  You have sudden confusion.  You have trouble speaking (aphasia) or understanding.  You have sudden trouble seeing in one or both eyes.  You have sudden trouble walking.  You have dizziness.  You have a loss of balance or coordination.  You have a sudden, severe headache with no known cause.  You have new chest pain or an irregular heartbeat. Any of these symptoms may represent a serious problem that is an emergency. Do not wait to see if the symptoms will go away. Get medical help at once. Call your local emergency services (911 in [place] Do not drive yourself to the hospital. Document Released: 08/05/2004 Document Revised: 11/12/2013 Document Reviewed: 12/29/2012 ExitCare Patient Information 2015 [employer]. This information is not intended to replace advice given   to you by your health care provider. Make sure you discuss any questions you have with your health care provider.   Smoking Cessation Quitting smoking is important to your health and has many advantages. However, it is not always easy to quit since nicotine is a very addictive drug. Oftentimes, people try 3 times or more  before being able to quit. This document explains the best ways for you to prepare to quit smoking. Quitting takes hard work and a lot of effort, but you can do it. ADVANTAGES OF QUITTING SMOKING  You will live longer, feel better, and live better.  Your body will feel the impact of quitting smoking almost immediately.  Within 20 minutes, blood pressure decreases. Your pulse returns to its normal level.  After 8 hours, carbon monoxide levels in the blood return to normal. Your oxygen level increases.  After 24 hours, the chance of having a heart attack starts to decrease. Your breath, hair, and body stop smelling like smoke.  After 48 hours, damaged nerve endings begin to recover. Your sense of taste and smell improve.  After 72 hours, the body is virtually free of nicotine. Your bronchial tubes relax and breathing becomes easier.  After 2 to 12 weeks, lungs can hold more air. Exercise becomes easier and circulation improves.  The risk of having a heart attack, stroke, cancer, or lung disease is greatly reduced.  After 1 year, the risk of coronary heart disease is cut in half.  After 5 years, the risk of stroke falls to the same as a nonsmoker.  After 10 years, the risk of lung cancer is cut in half and the risk of other cancers decreases significantly.  After 15 years, the risk of coronary heart disease drops, usually to the level of a nonsmoker.  If you are pregnant, quitting smoking will improve your chances of having a healthy baby.  The people you live with, especially any children, will be healthier.  You will have extra money to spend on things other than cigarettes. QUESTIONS TO THINK ABOUT BEFORE ATTEMPTING TO QUIT You may want to talk about your answers with your health care provider.  Why do you want to quit?  If you tried to quit in the past, what helped and what did not?  What will be the most difficult situations for you after you quit? How will you plan to  handle them?  Who can help you through the tough times? Your family? Friends? A health care provider?  What pleasures do you get from smoking? What ways can you still get pleasure if you quit? Here are some questions to ask your health care provider:  How can you help me to be successful at quitting?  What medicine do you think would be best for me and how should I take it?  What should I do if I need more help?  What is smoking withdrawal like? How can I get information on withdrawal? GET READY  Set a quit date.  Change your environment by getting rid of all cigarettes, ashtrays, matches, and lighters in your home, car, or work. Do not let people smoke in your home.  Review your past attempts to quit. Think about what worked and what did not. GET SUPPORT AND ENCOURAGEMENT You have a better chance of being successful if you have help. You can get support in many ways.  Tell your family, friends, and coworkers that you are going to quit and need their support. Ask them not   to smoke around you.  Get individual, group, or telephone counseling and support. Programs are available at local hospitals and health centers. Call your local health department for information about programs in your area.  Spiritual beliefs and practices may help some smokers quit.  Download a "quit meter" on your computer to keep track of quit statistics, such as how long you have gone without smoking, cigarettes not smoked, and money saved.  Get a self-help book about quitting smoking and staying off tobacco. LEARN NEW SKILLS AND BEHAVIORS  Distract yourself from urges to smoke. Talk to someone, go for a walk, or occupy your time with a task.  Change your normal routine. Take a different route to work. Drink tea instead of coffee. Eat breakfast in a different place.  Reduce your stress. Take a hot bath, exercise, or read a book.  Plan something enjoyable to do every day. Reward yourself for not  smoking.  Explore interactive web-based programs that specialize in helping you quit. GET MEDICINE AND USE IT CORRECTLY Medicines can help you stop smoking and decrease the urge to smoke. Combining medicine with the above behavioral methods and support can greatly increase your chances of successfully quitting smoking.  Nicotine replacement therapy helps deliver nicotine to your body without the negative effects and risks of smoking. Nicotine replacement therapy includes nicotine gum, lozenges, inhalers, nasal sprays, and skin patches. Some may be available over-the-counter and others require a prescription.  Antidepressant medicine helps people abstain from smoking, but how this works is unknown. This medicine is available by prescription.  Nicotinic receptor partial agonist medicine simulates the effect of nicotine in your brain. This medicine is available by prescription. Ask your health care provider for advice about which medicines to use and how to use them based on your health history. Your health care provider will tell you what side effects to look out for if you choose to be on a medicine or therapy. Carefully read the information on the package. Do not use any other product containing nicotine while using a nicotine replacement product.  RELAPSE OR DIFFICULT SITUATIONS Most relapses occur within the first 3 months after quitting. Do not be discouraged if you start smoking again. Remember, most people try several times before finally quitting. You may have symptoms of withdrawal because your body is used to nicotine. You may crave cigarettes, be irritable, feel very hungry, cough often, get headaches, or have difficulty concentrating. The withdrawal symptoms are only temporary. They are strongest when you first quit, but they will go away within 10-14 days. To reduce the chances of relapse, try to:  Avoid drinking alcohol. Drinking lowers your chances of successfully quitting.  Reduce the  amount of caffeine you consume. Once you quit smoking, the amount of caffeine in your body increases and can give you symptoms, such as a rapid heartbeat, sweating, and anxiety.  Avoid smokers because they can make you want to smoke.  Do not let weight gain distract you. Many smokers will gain weight when they quit, usually less than 10 pounds. Eat a healthy diet and stay active. You can always lose the weight gained after you quit.  Find ways to improve your mood other than smoking. FOR MORE INFORMATION  www.smokefree.gov  Document Released: 06/22/2001 Document Revised: 11/12/2013 Document Reviewed: 10/07/2011 ExitCare Patient Information 2015 [employer]. This information is not intended to replace advice given to you by your health care provider. Make sure you discuss any questions you have with your health care   provider.  

## 2014-05-01 NOTE — Addendum Note (Signed)
Addended by: Mena Goes on: 05/01/2014 02:19 PM   Modules accepted: Orders

## 2014-05-01 NOTE — Progress Notes (Signed)
Established Carotid Patient   History of Present Illness  [name] is a 73 y.o. male patient whom Dr. Scot Dock has been following for carotid disease. He has undergone previous left carotid endarterectomy and has a known moderate right carotid stenosis. He's been having some pain in his left hip which radiates down his left leg. This started when he began chemotherapy for lung cancer. The pain involves his hip and radiates down his left leg. This does not appear to be associated specifically with activity. I do not get any history of claudication, rest pain, or nonhealing ulcers. He does occasionally have some swelling in his legs but this has not been significant.  He does continue to smoke one pack per day of cigarettes.  The patient has negative history of TIA or stroke symptoms, specifically the patient denies a history of amaurosis fugax or monocular blindness, denies a history unilateral  of facial drooping, denies a history of hemiplegia, and denies a history of receptive or expressive aphasia.    The patient denies New Medical or Surgical History. He states that he is taking medication for RLS which helps.  Pt Diabetic: No Pt smoker: smoker  (1-1.5 ppd, started at age 44 yrs)  Pt meds include: Statin : No, states his cholesterol is OK ASA: Yes Other anticoagulants/antiplatelets: no   Past Medical History  Diagnosis Date  . Arthritis   . Wheezing   . Sore throat   . Carotid artery occlusion   . COPD (chronic obstructive pulmonary disease)   . Lumbar disc disease   . Restless leg syndrome   . Allergic rhinitis   . Hypertension   . Anxiety     anxiety  . Shortness of breath   . Lung mass   . Pneumonia     ? now  . GERD (gastroesophageal reflux disease)   . H/O hiatal hernia   . Cancer     lung mass  . Pre-operative cardiovascular examination   . Nonspecific abnormal unspecified cardiovascular function study   . Peripheral vascular disease, unspecified      Social History History  Substance Use Topics  . Smoking status: Current Every Day Smoker -- 1.00 packs/day for 55 years    Types: Cigarettes  . Smokeless tobacco: Former Systems developer    Quit date: 04/19/1952  . Alcohol Use: 4.2 oz/week    7 Cans of beer per week    Family History Family History  Problem Relation Age of Onset  . Heart disease Father     MI  . Heart attack Father   . Alcohol abuse Mother   . Stroke Brother   . Heart disease Brother   . Depression Sister   . Heart attack Brother     Surgical History Past Surgical History  Procedure Laterality Date  . Carotid endarterectomy  10/15/2010    left  . Hand surgery      repair of the left long, ring, and small finger after a saw injury  . Video bronchoscopy  07/24/2012    Procedure: VIDEO BRONCHOSCOPY WITH FLUORO;  Surgeon: Kathee Delton, MD;  Location: [facility];  Service: Cardiopulmonary;  Laterality: Bilateral;  . Lobectomy Right 09/14/2012    Procedure: LOBECTOMY;  Surgeon: Ivin Poot, MD;  Location: [facility];  Service: Thoracic;  Laterality: Right;  RIGHT UPPER LOBECTOMY  . Video assisted thoracoscopy Right 09/14/2012    Procedure: VIDEO ASSISTED THORACOSCOPY;  Surgeon: Ivin Poot, MD;  Location: [facility];  Service: Thoracic;  Laterality: Right;    No Known Allergies  Current Outpatient Prescriptions  Medication Sig Dispense Refill  . aspirin EC 81 MG tablet Take 81 mg by mouth daily. Takes one tablet every evening.      . diphenhydramine-acetaminophen (TYLENOL PM) 25-500 MG TABS Take 1 tablet by mouth at bedtime as needed.      Marland Kitchen HYDROcodone-acetaminophen (NORCO) 7.5-325 MG per tablet Take 1 tablet by mouth every 4 (four) hours as needed for pain.  40 tablet  0  . isosorbide mononitrate (IMDUR) 30 MG 24 hr tablet Take 1 tablet (30 mg total) by mouth daily.  90 tablet  3  . nitroGLYCERIN (NITROSTAT) 0.4 MG SL tablet Place 1 tablet (0.4 mg total) under the tongue every 5 (five) minutes as needed for chest  pain.  25 tablet  5  . omeprazole (PRILOSEC) 40 MG capsule Take 40 mg by mouth daily.      . pramipexole (MIRAPEX) 1 MG tablet Take 1 mg by mouth at bedtime.      Marland Kitchen zolpidem (AMBIEN) 10 MG tablet Take 10 mg by mouth at bedtime as needed for sleep.      . clonazePAM (KLONOPIN) 0.5 MG tablet Take 0.5 mg by mouth 2 (two) times daily as needed for anxiety. Takes one tablet by mouth two times daily as needed for anxiety.       No current facility-administered medications for this visit.    Review of Systems : See HPI for pertinent positives and negatives.  Physical Examination  Filed Vitals:   05/01/14 1114 05/01/14 1117  BP: 151/89 118/70  Pulse: 84 84  Resp:  16  Height:  5' 9.5" (1.765 m)  Weight:  168 lb (76.204 kg)  SpO2:  98%   Body mass index is 24.46 kg/(m^2).  General: WDWN male in NAD GAIT: normal Eyes: PERRLA Pulmonary:  Non-labored, decreased air movement in all fields, Positive  Rales, Positive rhonchi, & Negative wheezing. Chronic moist cough.  Cardiac: regular Darren ,  Negative detected murmur.  VASCULAR EXAM Carotid Bruits Right Left   Negative Negative    Radial pulses are 1+ right, 2+ left palpable.                                                                                                                            LE Pulses Right Left       POPLITEAL  not palpable   not palpable       POSTERIOR TIBIAL   palpable    palpable        DORSALIS PEDIS      ANTERIOR TIBIAL  palpable  palpable     Gastrointestinal: soft, nontender, BS WNL, no r/g,  negative masses palpated.  Musculoskeletal: Negative muscle atrophy/wasting. M/S 5/5 throughout, Extremities without ischemic changes.  Neurologic: A&O X 3; Appropriate Affect ; SENSATION ;normal;  Speech is normal CN 2-12 intact, Pain and light touch intact in extremities, Motor exam as listed  above.   Non-Invasive Vascular Imaging CAROTID DUPLEX 05/01/2014   CEREBROVASCULAR DUPLEX EVALUATION     INDICATION: Carotid endarterectomy     PREVIOUS INTERVENTION(S): Left carotid endarterectomy on 10/15/10    DUPLEX EXAM:     RIGHT  LEFT  Peak Systolic Velocities (cm/s) End Diastolic Velocities (cm/s) Plaque LOCATION Peak Systolic Velocities (cm/s) End Diastolic Velocities (cm/s) Plaque  96 14  CCA PROXIMAL 106 25   62 14  CCA MID 94 22   90 15 CP CCA DISTAL 50 13   215 27 HT ECA 102 17   247 68 CP ICA PROXIMAL 66 23   130 33  ICA MID 88 23   61 21  ICA DISTAL 72 25     2.7 ICA / CCA Ratio (PSV) Not Calculated  Retrograde Vertebral Flow Antegrade  786 Brachial Systolic Pressure (mmHg) 767  Monophasic (subclavian artery) Brachial Artery Waveforms Multiphasic (subclavian artery)    Plaque Morphology:  HM = Homogeneous, HT = Heterogeneous, CP = Calcific Plaque, SP = Smooth Plaque, IP = Irregular Plaque     ADDITIONAL FINDINGS:   Right external carotid artery stenosis noted.   Monophasic flow noted in the right subclavian artery which suggests a more proximal stenosis.   Retrograde flow noted in the right vertebral artery.   Greater than 33mm/Mg difference in the bilateral brachial pressures.    IMPRESSION: 1. Patent left carotid endarterectomy site with no left internal carotid artery stenosis. 2. Doppler velocities suggest a 60-79% stenosis of the right internal carotid artery origin. 3. Known right subclavian steal noted.    Compared to the previous exam:  No significant change in the maximum bilateral carotid velocities when compared to the previous exam on 10/17/12.     Assessment: [name] is a 73 y.o. male who is s/p left carotid endarterectomy on 10/15/10. He presents with asymptomatic patent left carotid endarterectomy site with no left internal carotid artery stenosis,  60-79% stenosis of the right internal carotid artery and known right subclavian steal noted. No significant change in the maximum bilateral carotid velocities when compared to the previous exam on  10/17/12. [name] most prominent atherosclerotic risk factor is smoking since age 48 years, currently smoking 1-1.5 ppd. He does not have DM.   Plan:  The patient was counseled re smoking cessation and given several resources for this. Follow-up in 6 months with Carotid Duplex scan.   I discussed in depth with the patient the nature of atherosclerosis, and emphasized the importance of maximal medical management including strict control of blood pressure, blood glucose, and lipid levels, obtaining regular exercise, and cessation of smoking.  The patient is aware that without maximal medical management the underlying atherosclerotic disease process will progress, limiting the benefit of any interventions. The patient was given information about stroke prevention and what symptoms should prompt the patient to seek immediate medical care. Thank you for allowing [place] to participate in this patient's care.  Clemon Chambers, RN, MSN, FNP-C [facility] Office: [phone]  Clinic Physician: Oneida Alar  05/01/2014 11:27 AM

## 2014-06-11 ENCOUNTER — Encounter: Payer: Self-pay | Admitting: Pulmonary Disease

## 2014-06-11 ENCOUNTER — Ambulatory Visit (INDEPENDENT_AMBULATORY_CARE_PROVIDER_SITE_OTHER): Payer: Medicare Other | Admitting: Pulmonary Disease

## 2014-06-11 VITALS — BP 122/72 | HR 91 | Temp 98.0°F | Ht 69.5 in | Wt 173.2 lb

## 2014-06-11 DIAGNOSIS — J438 Other emphysema: Secondary | ICD-10-CM

## 2014-06-11 DIAGNOSIS — J441 Chronic obstructive pulmonary disease with (acute) exacerbation: Secondary | ICD-10-CM

## 2014-06-11 DIAGNOSIS — I6529 Occlusion and stenosis of unspecified carotid artery: Secondary | ICD-10-CM

## 2014-06-11 MED ORDER — PREDNISONE 10 MG PO TABS: ORAL_TABLET | ORAL | Status: DC

## 2014-06-11 MED ORDER — LEVOFLOXACIN 750 MG PO TABS: 750.0000 mg | ORAL_TABLET | Freq: Every day | ORAL | Status: DC

## 2014-06-11 NOTE — Progress Notes (Signed)
   Subjective:    Patient ID: [name], male    DOB: 08-15-1940, 73 y.o.   MRN: [id number]  HPI The patient comes in today for an acute sick visit. He has known moderate COPD, and has not been seen in almost 2 years. He is currently receiving treatment for lung cancer under the direction of oncology. He gives a one-month history of persistent chest congestion, cough, and an increase recently in mucus that for now is nonpurulent. He has had increasing shortness of breath despite recently finishing a course of prednisone. He has not been willing to stay on a chronic bronchodilator regimen, and unfortunately continues to smoke excessively. He is really not willing to consider quitting. He denies any fevers, chills, or sweats.   Review of Systems  Constitutional: Negative for fever and unexpected weight change.  HENT: Positive for postnasal drip and rhinorrhea. Negative for congestion, dental problem, ear pain, nosebleeds, sinus pressure, sneezing, sore throat and trouble swallowing.   Eyes: Negative for redness and itching.  Respiratory: Positive for cough, shortness of breath and wheezing. Negative for chest tightness.   Cardiovascular: Negative for palpitations and leg swelling.  Gastrointestinal: Negative for nausea and vomiting.  Genitourinary: Negative for dysuria.  Musculoskeletal: Negative for joint swelling.  Skin: Negative for rash.  Neurological: Negative for headaches.  Hematological: Does not bruise/bleed easily.  Psychiatric/Behavioral: Negative for dysphoric mood. The patient is not nervous/anxious.        Objective:   Physical Exam Well-developed male in no acute distress Nose without purulence or discharge noted Neck without lymphadenopathy or thyromegaly Chest with diffuse wheezes and rhonchi, but no crackles. Decreased breath sounds diffusely Cardiac exam with regular rate and rhythm Lower extremities without edema, no cyanosis Alert and oriented, moves all 4  extremities.       Assessment & Plan:

## 2014-06-11 NOTE — Patient Instructions (Signed)
Will start stiolto 2 inhalations each am levaquin 750mg  one a day for 7 days Prednisone taper over 8 days Will check your blood oxygen level with walking today You must stop smoking or you have no chance of getting better. followup with me again in 4 weeks.

## 2014-06-11 NOTE — Assessment & Plan Note (Signed)
The patient has not been willing to stay on a consistent bronchodilator regimen. Will try him on stiolto.

## 2014-06-11 NOTE — Assessment & Plan Note (Signed)
The patient is having acute bronchospasm on exam, and his history is suggestive of a developing pulmonary infection. Unfortunately he is on no bronchodilators, and continues to smoke. He has not been willing to stay on medication in the past because it "irritates his throat". At this point, he will need treatment with an antibiotic, prednisone pulse, and will start him on stiolto as well. Will also evaluate him for ambulatory oxygen. Ultimately, the decision to get better is purely his. If he continues to smoke, we have no chance of keeping him well.

## 2014-06-21 ENCOUNTER — Ambulatory Visit (INDEPENDENT_AMBULATORY_CARE_PROVIDER_SITE_OTHER): Payer: Medicare Other | Admitting: Cardiology

## 2014-06-21 ENCOUNTER — Encounter: Payer: Self-pay | Admitting: Cardiology

## 2014-06-21 ENCOUNTER — Telehealth: Payer: Self-pay | Admitting: Interventional Cardiology

## 2014-06-21 VITALS — BP 140/90 | HR 105 | Ht 69.5 in | Wt 173.0 lb

## 2014-06-21 DIAGNOSIS — I6529 Occlusion and stenosis of unspecified carotid artery: Secondary | ICD-10-CM

## 2014-06-21 DIAGNOSIS — I2510 Atherosclerotic heart disease of native coronary artery without angina pectoris: Secondary | ICD-10-CM

## 2014-06-21 DIAGNOSIS — R000 Tachycardia, unspecified: Secondary | ICD-10-CM | POA: Insufficient documentation

## 2014-06-21 MED ORDER — METOPROLOL TARTRATE 25 MG PO TABS: 12.5000 mg | ORAL_TABLET | Freq: Two times a day (BID) | ORAL | Status: DC

## 2014-06-21 NOTE — Telephone Encounter (Signed)
Patient complaining of heart racing the last couple of days. Currently blood pressure is 138/88 with a heart rate of 120. Patient has no chest pain, but feels a "squeeze" once in a while. Patient's wife says he needs to come in, there are several opening on [facility] PA's schedule this afternoon. Patient has a 3:30 appointment today. Patient verbalized understanding and will come in.

## 2014-06-21 NOTE — Progress Notes (Signed)
06/21/2014 [name]   03-02-1941  [id number]  Primary Physician FRIED, Jaymes Graff, MD Primary Cardiologist: Dr Irish Lack  HPI:  73 y.o. male with peripheral vascular disease, s/p LCEA who had lung cancer- treated with surgery Feb 2014. At that time he had a stress test with an inferior wall defect. Cath showed a CTO of the RCA with collaterals. He was managed medically. He has not had any significant angina. He is in the office today because his wife noted his HR was high at home on his pulse Ox. She says sometimes as high as 120. Review of prior EKGs show his HR in the 80s. He admits to some vague Lt sided squeezing discomfort but no palpitations.   Current Outpatient Prescriptions  Medication Sig Dispense Refill  . aspirin EC 81 MG tablet Take 81 mg by mouth daily. Takes one tablet every evening.    . clonazePAM (KLONOPIN) 0.5 MG tablet Take 0.5 mg by mouth 2 (two) times daily as needed for anxiety. Takes one tablet by mouth two times daily as needed for anxiety.    . diphenhydramine-acetaminophen (TYLENOL PM) 25-500 MG TABS Take 1 tablet by mouth at bedtime as needed.    Marland Kitchen HYDROcodone-acetaminophen (NORCO) 7.5-325 MG per tablet Take 1 tablet by mouth every 4 (four) hours as needed for pain. 40 tablet 0  . isosorbide mononitrate (IMDUR) 30 MG 24 hr tablet Take 1 tablet (30 mg total) by mouth daily. 90 tablet 3  . nitroGLYCERIN (NITROSTAT) 0.4 MG SL tablet Place 1 tablet (0.4 mg total) under the tongue every 5 (five) minutes as needed for chest pain. 25 tablet 5  . omeprazole (PRILOSEC) 40 MG capsule Take 40 mg by mouth daily.    . pramipexole (MIRAPEX) 1 MG tablet Take 1 mg by mouth at bedtime.    Marland Kitchen zolpidem (AMBIEN) 10 MG tablet Take 10 mg by mouth at bedtime as needed for sleep.    . metoprolol tartrate (LOPRESSOR) 25 MG tablet Take 0.5 tablets (12.5 mg total) by mouth 2 (two) times daily. 30 tablet 5   No current facility-administered medications for this visit.    No Known  Allergies  History   Social History  . Marital Status: Married    Spouse Name: N/A    Number of Children: Y  . Years of Education: N/A   Occupational History  . works in Education administrator    Social History Main Topics  . Smoking status: Current Every Day Smoker -- 1.00 packs/day for 55 years    Types: Cigarettes  . Smokeless tobacco: Former Systems developer    Quit date: 04/19/1952  . Alcohol Use: 4.2 oz/week    7 Cans of beer per week  . Drug Use: No  . Sexual Activity: Not on file   Other Topics Concern  . Not on file   Social History Narrative     Review of Systems: General: negative for chills, fever, night sweats or weight changes.  Cardiovascular: negative for chest pain, dyspnea on exertion, edema, orthopnea, palpitations, paroxysmal nocturnal dyspnea or shortness of breath Dermatological: negative for rash Respiratory: negative for cough or wheezing Urologic: negative for hematuria Abdominal: negative for nausea, vomiting, diarrhea, bright red blood per rectum, melena, or hematemesis Neurologic: negative for visual changes, syncope, or dizziness All other systems reviewed and are otherwise negative except as noted above.    Blood pressure 140/90, pulse 105, height 5' 9.5" (1.765 m), weight 173 lb (78.472 kg).  General appearance: alert, cooperative and  no distress Lungs: decreased breath sounds, no wheezing Heart: regular rate and rhythm  EKG NSR, ST 105  ASSESSMENT AND PLAN:   Tachycardia New last few weeks- HR up to 115  COPD (chronic obstructive pulmonary disease) Severe COPD and lung Ca  Coronary atherosclerosis of native coronary artery CAD Feb 2014- CTO RCA with collaterals   PLAN  Initially I was not going to change anything but he may be having mild rate related angina. Add Lopressor 12.5 mg once a day. Goal HR < 100. Hopefully he will not have any increased wheezing- he has been instructed to stop the Metoprolol if this happens.   [facility]  KPA-C 06/21/2014 4:26 PM

## 2014-06-21 NOTE — Assessment & Plan Note (Signed)
CAD Feb 2014- CTO RCA with collaterals

## 2014-06-21 NOTE — Assessment & Plan Note (Signed)
New last few weeks- HR up to 115

## 2014-06-21 NOTE — Patient Instructions (Addendum)
Your physician has recommended you make the following change in your medication:  1) START Metoprolol 12.5mg  twice daily. An RX has been sent to your pharmacy  Your physician recommends that you schedule a follow-up appointment in: 3 months with Dr.Varanasi

## 2014-06-21 NOTE — Assessment & Plan Note (Signed)
Severe COPD and lung Ca

## 2014-06-21 NOTE — Telephone Encounter (Signed)
New Msg   Pt wife concerned about pt HR staying between 105-112. Please contact at [phone].

## 2014-07-09 ENCOUNTER — Ambulatory Visit: Payer: Medicare Other | Admitting: Pulmonary Disease

## 2014-07-11 ENCOUNTER — Telehealth: Payer: Self-pay | Admitting: Pulmonary Disease

## 2014-07-11 MED ORDER — PROMETHAZINE-CODEINE 6.25-10 MG/5ML PO SYRP: 5.0000 mL | ORAL_SOLUTION | Freq: Four times a day (QID) | ORAL | Status: DC | PRN

## 2014-07-11 MED ORDER — PREDNISONE 10 MG PO TABS: ORAL_TABLET | ORAL | Status: DC

## 2014-07-11 NOTE — Telephone Encounter (Signed)
Called and spoke to pt. Pt c/o chest congestion and a very hard cough with white thick mucus. Pt stated he will cough so hard he has passed out and lost consciousness for a couple seconds. Pt stated he had a syncopal episode on 07/10/14 after coughing and when he passed out he hit his head on the coffee table. Pt denied sleepiness, dizziness or headache after he hit his head. Pt denies change in SOB, CP/tightness or f/c/s. Pt stated he has vagal down before from coughing, he stated several times prior to this event. Pt last saw [place] [facility] on 06/11/2014. [place] unavailable. Will send to doc of day.  Dr. Annamaria Boots please advise.    No Known Allergies   Current Outpatient Prescriptions on File Prior to Visit  Medication Sig Dispense Refill  . aspirin EC 81 MG tablet Take 81 mg by mouth daily. Takes one tablet every evening.    . clonazePAM (KLONOPIN) 0.5 MG tablet Take 0.5 mg by mouth 2 (two) times daily as needed for anxiety. Takes one tablet by mouth two times daily as needed for anxiety.    . diphenhydramine-acetaminophen (TYLENOL PM) 25-500 MG TABS Take 1 tablet by mouth at bedtime as needed.    Marland Kitchen HYDROcodone-acetaminophen (NORCO) 7.5-325 MG per tablet Take 1 tablet by mouth every 4 (four) hours as needed for pain. 40 tablet 0  . isosorbide mononitrate (IMDUR) 30 MG 24 hr tablet Take 1 tablet (30 mg total) by mouth daily. 90 tablet 3  . metoprolol tartrate (LOPRESSOR) 25 MG tablet Take 0.5 tablets (12.5 mg total) by mouth 2 (two) times daily. 30 tablet 5  . nitroGLYCERIN (NITROSTAT) 0.4 MG SL tablet Place 1 tablet (0.4 mg total) under the tongue every 5 (five) minutes as needed for chest pain. 25 tablet 5  . omeprazole (PRILOSEC) 40 MG capsule Take 40 mg by mouth daily.    . pramipexole (MIRAPEX) 1 MG tablet Take 1 mg by mouth at bedtime.    Marland Kitchen zolpidem (AMBIEN) 10 MG tablet Take 10 mg by mouth at bedtime as needed for sleep.     No current facility-administered medications on file prior to visit.

## 2014-07-11 NOTE — Telephone Encounter (Signed)
Called and spoke with pts wife and she is aware of meds per CY.  These have been called to the pharmacy and nothing further is needed.

## 2014-07-11 NOTE — Addendum Note (Signed)
Addended by: Elie Confer on: 07/11/2014 12:50 PM   Modules accepted: Orders

## 2014-07-11 NOTE — Telephone Encounter (Signed)
Offer prednisone 10 mg, # 20, 4 X 2 DAYS, 3 X 2 DAYS, 2 X 2 DAYS, 1 X 2 DAYS            prometh codeine cough syrup 120 ml, 5 ml every 6 hours if needed for cough

## 2014-07-18 ENCOUNTER — Other Ambulatory Visit: Payer: Self-pay | Admitting: Otolaryngology

## 2014-07-18 DIAGNOSIS — R1310 Dysphagia, unspecified: Secondary | ICD-10-CM

## 2014-07-19 ENCOUNTER — Encounter: Payer: Self-pay | Admitting: Pulmonary Disease

## 2014-07-19 ENCOUNTER — Ambulatory Visit
Admission: RE | Admit: 2014-07-19 | Discharge: 2014-07-19 | Disposition: A | Discharge status: Home / Self Care | Payer: Medicare Other | Source: Ambulatory Visit | Attending: Otolaryngology | Admitting: Otolaryngology

## 2014-07-19 ENCOUNTER — Ambulatory Visit (INDEPENDENT_AMBULATORY_CARE_PROVIDER_SITE_OTHER): Payer: Medicare Other | Admitting: Pulmonary Disease

## 2014-07-19 VITALS — BP 148/78 | HR 98 | Temp 97.1°F | Ht 69.5 in | Wt 178.6 lb

## 2014-07-19 DIAGNOSIS — J438 Other emphysema: Secondary | ICD-10-CM

## 2014-07-19 DIAGNOSIS — R1310 Dysphagia, unspecified: Secondary | ICD-10-CM

## 2014-07-19 MED ORDER — TIOTROPIUM BROMIDE-OLODATEROL 2.5-2.5 MCG/ACT IN AERS
2.0000 | INHALATION_SPRAY | Freq: Every day | RESPIRATORY_TRACT | Status: DC
Start: 2014-07-19 — End: 2014-07-29

## 2014-07-19 NOTE — Progress Notes (Signed)
   Subjective:    Patient ID: [name], male    DOB: Jun 15, 1941, 74 y.o.   MRN: [id number]  HPI Patient comes in today for follow-up of his known COPD. He was started on stiolto at the last visit, and did see some improvement. However, he thought the prednisone helped him more than anything else. Unfortunately he is continuing to smoke, and this is resulting in significant mucus production and cough. However, the mucus is nonpurulent. His family member is asking about chronic prednisone, and I have told her this is used as a last resort. He has been tried on inhaled corticosteroids in the past, but has had poor tolerance because of throat irritation and thrush. He has not been tried on small particle size Qvar.   Review of Systems  Constitutional: Negative for fever and unexpected weight change.  HENT: Positive for congestion. Negative for dental problem, ear pain, nosebleeds, postnasal drip, rhinorrhea, sinus pressure, sneezing, sore throat and trouble swallowing.   Eyes: Negative for redness and itching.  Respiratory: Positive for cough, shortness of breath and wheezing. Negative for chest tightness.   Cardiovascular: Negative for palpitations and leg swelling.  Gastrointestinal: Negative for nausea and vomiting.  Genitourinary: Negative for dysuria.  Musculoskeletal: Negative for joint swelling.  Skin: Negative for rash.  Neurological: Positive for syncope. Negative for headaches.  Hematological: Does not bruise/bleed easily.  Psychiatric/Behavioral: Negative for dysphoric mood. The patient is not nervous/anxious.        Objective:   Physical Exam Well-developed male in no acute distress Nose without purulence or discharge noted Neck without lymphadenopathy or thyromegaly Chest with a few rhonchi, but adequate airflow. No active wheezing Cardiac exam with regular rate and rhythm Lower extremities without edema, no cyanosis Alert and oriented, moves all 4 extremities.       Assessment & Plan:

## 2014-07-19 NOTE — Assessment & Plan Note (Signed)
The patient feels that he did better on the stiolto, but he also feels the prednisone helped him more than anything else. Explained to him that he has chronic ongoing airway inflammation related to his smoking, which results in extensive mucus production and cough. I explained to him that he will continue to have these issues, especially cough, and totally quit smoking. I am willing to try him on a different inhaled corticosteroid to see if this will help with airway inflammation. He has been intolerant of inhaled corticosteroids in the past, but will try him on Qvar with its small particle size via a spacer to see if he is able to tolerate this.  I have also explained to him there is no medication available which is going to make his symptoms better if he does not quit smoking.

## 2014-07-19 NOTE — Patient Instructions (Signed)
Stay on the stiolto as per last visit, 2 inhalations each am.  Will send in prescription for you.  If this is not covered on your plan, let us know and we can discuss options. Will try you on qvar 27mcg, 2 puffs each am with the spacer device.  You have to rinse your mouth well after using.  The samples should last until your next visit. Stop smoking.  Your cough and mucus will continue until this is done. followup with me again in 48mos.

## 2014-07-24 ENCOUNTER — Telehealth: Payer: Self-pay | Admitting: Internal Medicine

## 2014-07-24 ENCOUNTER — Telehealth: Payer: Self-pay | Admitting: Medical Oncology

## 2014-07-24 NOTE — Telephone Encounter (Signed)
returned call and pt had already s.w Diane and r/s appts

## 2014-07-24 NOTE — Telephone Encounter (Signed)
, °

## 2014-07-24 NOTE — Telephone Encounter (Signed)
Wife request to move up scan and f/u because pt is experiencing chest discomfort. Pt reports his pain is in the middle of his chest. He saw  His PCP who referred him to Dr Gwenette Greet who gave pt "steroid inhaler and to f/u with [facility]. Onc tx request sent.

## 2014-07-25 ENCOUNTER — Telehealth: Payer: Self-pay | Admitting: Pulmonary Disease

## 2014-07-25 NOTE — Telephone Encounter (Signed)
Received PA fax for [employer]. Called [facility] and spoke to a PA rep. The covered alternatives are spiriva and foradil or spiriva and serevent. Per pt's chart pt has tried and failed advair, arcapta and spiriva.   Last OV 07/19/2014.  Patient Instructions     Stay on the stiolto as per last visit, 2 inhalations each am. Will send in prescription for you. If this is not covered on your plan, let us know and we can discuss options. Will try you on qvar 64mcg, 2 puffs each am with the spacer device. You have to rinse your mouth well after using. The samples should last until your next visit. Stop smoking. Your cough and mucus will continue until this is done. followup with me again in 36mos.     [place] please advise if ok to move forward with PA.

## 2014-07-29 MED ORDER — TIOTROPIUM BROMIDE MONOHYDRATE 2.5 MCG/ACT IN AERS: 2.0000 | INHALATION_SPRAY | Freq: Every day | RESPIRATORY_TRACT | Status: DC

## 2014-07-29 MED ORDER — FORMOTEROL FUMARATE 12 MCG IN CAPS: 12.0000 ug | ORAL_CAPSULE | Freq: Two times a day (BID) | RESPIRATORY_TRACT | Status: DC

## 2014-07-29 NOTE — Telephone Encounter (Signed)
Can try spiriva respimat two inhalations each am, along with foradil one inhaled capsule am AND pm everyday. He will have to come by to be shown how to use respimat if he has never done this, and can bring his foradil by as well and we can show him that.

## 2014-07-29 NOTE — Telephone Encounter (Signed)
Called and spoke to pt. Informed pt of the changes. Pt verbalized understanding. Rx sent to preferred pharmacy. Pt aware how to use the respimat and is also aware to come by office to be shown how to use foradil when he picks it from the pharmacy. Nothing further needed.

## 2014-08-01 ENCOUNTER — Telehealth: Payer: Self-pay | Admitting: Internal Medicine

## 2014-08-01 ENCOUNTER — Ambulatory Visit (HOSPITAL_BASED_OUTPATIENT_CLINIC_OR_DEPARTMENT_OTHER): Payer: Medicare Other | Admitting: Physician Assistant

## 2014-08-01 ENCOUNTER — Other Ambulatory Visit (HOSPITAL_BASED_OUTPATIENT_CLINIC_OR_DEPARTMENT_OTHER): Payer: Medicare Other

## 2014-08-01 ENCOUNTER — Ambulatory Visit (HOSPITAL_COMMUNITY)
Admission: RE | Admit: 2014-08-01 | Discharge: 2014-08-01 | Disposition: A | Discharge status: Home / Self Care | Payer: Medicare Other | Source: Ambulatory Visit | Attending: Internal Medicine | Admitting: Internal Medicine

## 2014-08-01 ENCOUNTER — Telehealth: Payer: Self-pay

## 2014-08-01 ENCOUNTER — Encounter: Payer: Self-pay | Admitting: Physician Assistant

## 2014-08-01 ENCOUNTER — Other Ambulatory Visit: Payer: Self-pay | Admitting: Physician Assistant

## 2014-08-01 ENCOUNTER — Encounter (HOSPITAL_COMMUNITY): Payer: Self-pay

## 2014-08-01 ENCOUNTER — Telehealth: Payer: Self-pay | Admitting: *Deleted

## 2014-08-01 VITALS — BP 153/77 | HR 93 | Temp 98.3°F | Resp 18 | Ht 69.5 in | Wt 173.1 lb

## 2014-08-01 DIAGNOSIS — Z720 Tobacco use: Secondary | ICD-10-CM | POA: Diagnosis not present

## 2014-08-01 DIAGNOSIS — C3411 Malignant neoplasm of upper lobe, right bronchus or lung: Secondary | ICD-10-CM

## 2014-08-01 DIAGNOSIS — R05 Cough: Secondary | ICD-10-CM | POA: Insufficient documentation

## 2014-08-01 DIAGNOSIS — Z85118 Personal history of other malignant neoplasm of bronchus and lung: Secondary | ICD-10-CM | POA: Insufficient documentation

## 2014-08-01 DIAGNOSIS — R599 Enlarged lymph nodes, unspecified: Secondary | ICD-10-CM | POA: Diagnosis not present

## 2014-08-01 DIAGNOSIS — R079 Chest pain, unspecified: Secondary | ICD-10-CM | POA: Insufficient documentation

## 2014-08-01 DIAGNOSIS — C3490 Malignant neoplasm of unspecified part of unspecified bronchus or lung: Secondary | ICD-10-CM

## 2014-08-01 LAB — COMPREHENSIVE METABOLIC PANEL (CC13)
ALT: 20 U/L (ref 0–55)
AST: 18 U/L (ref 5–34)
Albumin: 3.5 g/dL (ref 3.5–5.0)
Alkaline Phosphatase: 82 U/L (ref 40–150)
Anion Gap: 7 mEq/L (ref 3–11)
BILIRUBIN TOTAL: 0.24 mg/dL (ref 0.20–1.20)
BUN: 7.5 mg/dL (ref 7.0–26.0)
CALCIUM: 8.9 mg/dL (ref 8.4–10.4)
CHLORIDE: 98 meq/L (ref 98–109)
CO2: 31 mEq/L — ABNORMAL HIGH (ref 22–29)
CREATININE: 0.8 mg/dL (ref 0.7–1.3)
EGFR: 89 mL/min/{1.73_m2} — ABNORMAL LOW (ref >90)
Glucose: 93 mg/dl (ref 70–140)
Potassium: 4.7 mEq/L (ref 3.5–5.1)
Sodium: 136 mEq/L (ref 136–145)
Total Protein: 6.8 g/dL (ref 6.4–8.3)

## 2014-08-01 LAB — CBC WITH DIFFERENTIAL/PLATELET
BASO%: 0.8 % (ref 0.0–2.0)
BASOS ABS: 0.1 10*3/uL (ref 0.0–0.1)
EOS%: 1.4 % (ref 0.0–7.0)
Eosinophils Absolute: 0.1 10*3/uL (ref 0.0–0.5)
HCT: 44.8 % (ref 38.4–49.9)
HGB: 14.4 g/dL (ref 13.0–17.1)
LYMPH#: 1.7 10*3/uL (ref 0.9–3.3)
LYMPH%: 22.6 % (ref 14.0–49.0)
MCH: 31 pg (ref 27.2–33.4)
MCHC: 32.1 g/dL (ref 32.0–36.0)
MCV: 96.5 fL (ref 79.3–98.0)
MONO#: 0.9 10*3/uL (ref 0.1–0.9)
MONO%: 11.5 % (ref 0.0–14.0)
NEUT#: 4.8 10*3/uL (ref 1.5–6.5)
NEUT%: 63.7 % (ref 39.0–75.0)
Platelets: 310 10*3/uL (ref 140–400)
RBC: 4.64 10*6/uL (ref 4.20–5.82)
RDW: 13.8 % (ref 11.0–14.6)
WBC: 7.6 10*3/uL (ref 4.0–10.3)

## 2014-08-01 MED ORDER — IOHEXOL 300 MG/ML  SOLN
80.0000 mL | Freq: Once | INTRAMUSCULAR | Status: AC | PRN
  Administered 2014-08-01: 80 mL via INTRAVENOUS

## 2014-08-01 NOTE — Telephone Encounter (Signed)
gave wife avs report for feb. [facility] will call pt re pet/mri appts. wife/pt aware.

## 2014-08-01 NOTE — Telephone Encounter (Signed)
THIS REPORT WAS CALLED AND FAXED TO [facility]. THE REPORT WAS GIVEN TO DR.MOHAMED.

## 2014-08-01 NOTE — Progress Notes (Addendum)
[facility] [phone]   [phone]  OFFICE PROGRESS NOTE  [name], MD [place] [address]  DIAGNOSIS: Stage IIA (T2 B., N0, M0) non-small cell lung cancer, squamous cell carcinoma diagnosed in December of 2013   PRIOR THERAPY:  1) Status post right upper lobectomy under the care of Dr. Prescott Gum on 09/14/2012.  2) Adjuvant chemotherapy with cisplatin 75 mg/M2 and docetaxel 75 mg/M2 with Neulasta support every 3 weeks, status post 1 cycle. Starting with cycle #2, patient will receive carboplatin for an AUC initially of 4.5 and paclitaxel at 175 mg per meter squared with Neulasta support given every 3 weeks. Status post a total of 3 cycles.   CURRENT THERAPY: Observation.   CHEMOTHERAPY INTENT: Curative  CURRENT # OF CHEMOTHERAPY CYCLES: 0  CURRENT ANTIEMETICS: Zofran, dexamethasone and Compazine  CURRENT SMOKING STATUS: Current smoker and was strongly advised to quit smoking and altered smoke cessation program.  ORAL CHEMOTHERAPY AND CONSENT: None  CURRENT BISPHOSPHONATES USE: None  PAIN MANAGEMENT: 3/10 controlled by Norco when necessary  NARCOTICS INDUCED CONSTIPATION: Milk of magnesia on as-needed basis.  LIVING WILL AND CODE STATUS: No CODE BLUE.   INTERVAL HISTORY:  [name] is a 74 y.o. male returns to the clinic today for followup visit accompanied his wife. He has been observation for the last 2 and a half years with no significant complaints. He denied having any significant nausea or vomiting. He denied having any chest pain, but recently has noted some increased shortness of breath and congestion. He has a cough that is primarily productive of clear secretions although the secretions are occasionally yellow. The cough and the secretions are not associated with fever or chills. He denied any hemoptysis.  He has no significant weight loss or night sweats. No fever or chills but He continues to smoke and is  unwilling to quit. The patient had repeat CT scan of the chest performed recently and he is here for evaluation and discussion of his scan results.   MEDICAL HISTORY: Past Medical History  Diagnosis Date  . Arthritis   . Wheezing   . Sore throat   . Carotid artery occlusion   . COPD (chronic obstructive pulmonary disease)   . Lumbar disc disease   . Restless leg syndrome   . Allergic rhinitis   . Hypertension   . Anxiety     anxiety  . Shortness of breath   . Lung mass   . Pneumonia     ? now  . GERD (gastroesophageal reflux disease)   . H/O hiatal hernia   . Pre-operative cardiovascular examination   . Nonspecific abnormal unspecified cardiovascular function study   . Peripheral vascular disease, unspecified   . Cancer     lung mass    ALLERGIES:  has No Known Allergies.  MEDICATIONS:  Current Outpatient Prescriptions  Medication Sig Dispense Refill  . aspirin EC 81 MG tablet Take 81 mg by mouth daily. Takes one tablet every evening.    . clonazePAM (KLONOPIN) 0.5 MG tablet Take 0.5 mg by mouth 2 (two) times daily as needed for anxiety. Takes one tablet by mouth two times daily as needed for anxiety.    . diphenhydramine-acetaminophen (TYLENOL PM) 25-500 MG TABS Take 1 tablet by mouth at bedtime as needed.    . formoterol (FORADIL AEROLIZER) 12 MCG capsule for inhaler Place 1 capsule (12 mcg total) into inhaler and inhale 2 (  two) times daily. 60 capsule 5  . HYDROcodone-acetaminophen (NORCO) 7.5-325 MG per tablet Take 1 tablet by mouth every 4 (four) hours as needed for pain. 40 tablet 0  . isosorbide mononitrate (IMDUR) 30 MG 24 hr tablet Take 1 tablet (30 mg total) by mouth daily. 90 tablet 3  . nitroGLYCERIN (NITROSTAT) 0.4 MG SL tablet Place 1 tablet (0.4 mg total) under the tongue every 5 (five) minutes as needed for chest pain. 25 tablet 5  . omeprazole (PRILOSEC) 40 MG capsule Take 40 mg by mouth daily.    . pramipexole (MIRAPEX) 1 MG tablet Take 1 mg by mouth at  bedtime.    . promethazine-codeine (PHENERGAN WITH CODEINE) 6.25-10 MG/5ML syrup Take 5 mLs by mouth every 6 (six) hours as needed for cough. 120 mL 0  . Tiotropium Bromide Monohydrate (SPIRIVA RESPIMAT) 2.5 MCG/ACT AERS Inhale 2 puffs into the lungs daily. 1 Inhaler 5  . UNABLE TO FIND 3 differeny eye drops- having cataract surgery 08/05/14.    Marland Kitchen zolpidem (AMBIEN) 10 MG tablet Take 10 mg by mouth at bedtime as needed for sleep.     No current facility-administered medications for this visit.    SURGICAL HISTORY:  Past Surgical History  Procedure Laterality Date  . Carotid endarterectomy  10/15/2010    left  . Hand surgery      repair of the left long, ring, and small finger after a saw injury  . Video bronchoscopy  07/24/2012    Procedure: VIDEO BRONCHOSCOPY WITH FLUORO;  Surgeon: Kathee Delton, MD;  Location: [facility];  Service: Cardiopulmonary;  Laterality: Bilateral;  . Lobectomy Right 09/14/2012    Procedure: LOBECTOMY;  Surgeon: Ivin Poot, MD;  Location: [facility];  Service: Thoracic;  Laterality: Right;  RIGHT UPPER LOBECTOMY  . Video assisted thoracoscopy Right 09/14/2012    Procedure: VIDEO ASSISTED THORACOSCOPY;  Surgeon: Ivin Poot, MD;  Location: [facility];  Service: Thoracic;  Laterality: Right;    REVIEW OF SYSTEMS:  A comprehensive review of systems was negative.   PHYSICAL EXAMINATION: General appearance: alert, cooperative and no distress Head: Normocephalic, without obvious abnormality, atraumatic Neck: no adenopathy, no JVD, supple, symmetrical, trachea midline and thyroid not enlarged, symmetric, no tenderness/mass/nodules Lymph nodes: Cervical, supraclavicular, and axillary nodes normal. Resp: clear to auscultation bilaterally Back: symmetric, no curvature. ROM normal. No CVA tenderness. Cardio: regular rate and rhythm, S1, S2 normal, no murmur, click, rub or gallop GI: soft, non-tender; bowel sounds normal; no masses,  no organomegaly Extremities: extremities  normal, atraumatic, no cyanosis or edema  ECOG PERFORMANCE STATUS: 1 - Symptomatic but completely ambulatory  Blood pressure 153/77, pulse 93, temperature 98.3 F (36.8 C), temperature source Oral, resp. rate 18, height 5' 9.5" (1.765 m), weight 173 lb 1.6 oz (78.518 kg), SpO2 96 %.  LABORATORY DATA: Lab Results  Component Value Date   WBC 7.6 08/01/2014   HGB 14.4 08/01/2014   HCT 44.8 08/01/2014   MCV 96.5 08/01/2014   PLT 310 08/01/2014      Chemistry      Component Value Date/Time   NA 136 08/01/2014 1030   NA 133* 03/07/2014 0918   K 4.7 08/01/2014 1030   K 4.2 03/07/2014 0918   CL 96 03/07/2014 0918   CL 101 12/28/2012 0852   CO2 31* 08/01/2014 1030   CO2 32 03/07/2014 0918   BUN 7.5 08/01/2014 1030   BUN 9 03/07/2014 0918   CREATININE 0.8 08/01/2014 1030   CREATININE 0.8  03/07/2014 0918      Component Value Date/Time   CALCIUM 8.9 08/01/2014 1030   CALCIUM 9.2 03/07/2014 0918   ALKPHOS 82 08/01/2014 1030   ALKPHOS 80 09/16/2012 0420   AST 18 08/01/2014 1030   AST 35 09/16/2012 0420   ALT 20 08/01/2014 1030   ALT 19 09/16/2012 0420   BILITOT 0.24 08/01/2014 1030   BILITOT 0.4 09/16/2012 0420       RADIOGRAPHIC STUDIES: Ct Chest W Contrast  08/01/2014   CLINICAL DATA:  History of lung cancer diagnosed 2013. Status post right upper lobe lobectomy. Chest pain, cough and shortness of Breath.  EXAM: CT CHEST WITH CONTRAST  TECHNIQUE: Multidetector CT imaging of the chest was performed during intravenous contrast administration.  CONTRAST:  102mL OMNIPAQUE IOHEXOL 300 MG/ML  SOLN  COMPARISON:  01/31/2014  FINDINGS: Chest wall: No chest wall mass. There are new/enlarged right subpectoral and right axillary lymph nodes. The largest lymph node on image number 16 has a transverse diameter 13 mm. The bony thorax is intact. No destructive bone lesions or spinal canal compromise.  Mediastinum: There are enlarging mediastinal lymph nodes. 11 mm right paratracheal node on  image 12 measures 11 mm. A right-sided paraesophageal lymph node on image number 10 measures 10 mm. There are clusters of enlarging lymph nodes in the superior mediastinum and also in the prevascular space. There are enlarging aortic 0 pulmonary window nodes and subcarinal nodes. There is also new/progressive hilar adenopathy bilaterally. The right hilar adenopathy in particular is worrisome for recurrent disease and this measures a maximum of 14 mm.  The heart is normal in size. No pericardial effusion. Stable dense 3 vessel coronary artery calcifications. The aorta is normal in caliber. No dissection. Stable atherosclerotic calcifications involving the aorta and branch vessels.  Lungs: There are several small bilateral pulmonary nodules along with a larger 11 mm nodule in the right lower lobe on image number 34. There is also a 9 mm nodule in the right upper lobe on image 14. Some of these appear to be associated with an atypical infectious process such as MAC with peribronchial thickening and tree-in-bud appearance. Most of these certainly could be infectious airspace nodules but based on the other findings in the chest are certainly suspicious for metastatic disease. No pleural effusions are identified.  Upper abdomen: No findings for upper abdominal metastatic disease. Advanced atherosclerotic calcifications involving the abdominal aorta are noted.  IMPRESSION: 1. Mediastinal, hilar and right axillary adenopathy are worrisome for recurrent neoplasm. There are also some worrisome pulmonary nodules in the lungs suspicious for metastasis. However, there is also a superimposed atypical infectious process with small airspace nodules, peribronchial thickening and tree-in-bud appearance. All the above findings could be due to atypical infection but I think it is unlikely. I would be more worried about metastatic disease with superimposed atypical infectious process. 2. PET-CT may be helpful for directing a potential  biopsy. Alternatively, working the patient up for atypical infection, treating the patient and then getting a short-term chest CT (4-6 weeks) would be another alternative.   Electronically Signed   By: Kalman Jewels M.D.   On: 08/01/2014 12:57   Dg Esophagus  07/19/2014   CLINICAL DATA:  Difficulty swallowing.  EXAM: ESOPHOGRAM / BARIUM SWALLOW / BARIUM TABLET STUDY  TECHNIQUE: Combined double contrast and single contrast examination performed using effervescent crystals, thick barium liquid, and thin barium liquid. The patient was observed with fluoroscopy swallowing a 47mm barium sulphate tablet.  FLUOROSCOPY TIME:  1 min 0 seconds  COMPARISON:  10/26/2011  FINDINGS: The oropharyngeal swallowing mechanisms are normal. There is no aspiration. The mucosa and motility of the esophagus are normal. There is a small sliding hiatal hernia. There is no stricture or mass or esophagitis.  A 13 mm barium tablet passed immediately from the mouth to the stomach with no delay.  IMPRESSION: Normal barium esophagram except for a small sliding hiatal hernia, unchanged.   Electronically Signed   By: Rozetta Nunnery M.D.   On: 07/19/2014 11:01   ASSESSMENT AND PLAN: This is a very pleasant 74 years old white male with history of stage IIa non-small cell lung cancer status post right upper lobectomy followed by 4 cycles of adjuvant chemotherapy.  His recent CT scan of the chest showed questionable evidence evidence for disease recurrence. To further evaluate this patient will be scheduled for a PET scan as well as an MRI of the brain with and without contrast. Hopefully we can get both the studies performed within the next 2 weeks and the patient will follow-up in 2 weeks to discuss the results of the study and treatment options if needed. Patient his wife are in agreement with this plan. Patient was discussed with and also seen by [name].  He was advised to call immediately if he has any concerning symptoms in the  interval.  The patient voices understanding of current disease status and treatment options and is in agreement with the current care plan.  All questions were answered. The patient knows to call the clinic with any problems, questions or concerns. We can certainly see the patient much sooner if necessary.  Carlton Adam, PA-C 08/01/2014  ADDENDUM: Hematology/Oncology Attending: I had a face to face encounter with the patient. I recommended his care plan. This is a very pleasant 74 years old white male with history of stage II a non-small cell lung cancer status post right upper lobectomy with lymph node dissection followed by 4 cycles of adjuvant chemotherapy initially with cisplatin and docetaxel but this was not tolerated and the patient was switched to carboplatin and paclitaxel for 3 more cycles.  He has been observation but unfortunately the recent CT scan of the chest showed significant mediastinal and axillary lymphadenopathy suspicious for disease recurrence. I discussed the scan results and showed the images to the patient and his wife. I recommended for him to have a PET scan as well as MRI of the brain performed within the next week or so for evaluation of his disease and to rule out disease recurrence. I would see him back for follow-up visit in 2 weeks for evaluation and further discussion of his treatment option. For smoke cessation I strongly encouraged the patient to quit smoking and offered him a smoking cessation program. The patient was advised to call immediately if he has any concerning symptoms in the interval.  Disclaimer: This note was dictated with voice recognition software. Similar sounding words can inadvertently be transcribed and may not be corrected upon review. Eilleen Kempf., MD @T (<PARAMETER> error)@

## 2014-08-01 NOTE — Patient Instructions (Signed)
Your recent restaging CT scan of the chest showed questionable evidence for disease recurrence/progression Your being scheduled for a restaging PET scan as well as an MRI of your brain. Follow-up in 2 weeks to discuss results of these restaging studies and treatment options

## 2014-08-01 NOTE — Telephone Encounter (Signed)
Called CT, working pt in today to see [employer]. Requested a STAT read on scan today. Spoke with Maudie Mercury who states she will put it in as a STAT read.

## 2014-08-02 ENCOUNTER — Ambulatory Visit: Payer: Medicare Other | Admitting: Physician Assistant

## 2014-08-07 ENCOUNTER — Ambulatory Visit (HOSPITAL_COMMUNITY): Payer: Medicare Other

## 2014-08-07 ENCOUNTER — Other Ambulatory Visit: Payer: Medicare Other

## 2014-08-08 ENCOUNTER — Ambulatory Visit (HOSPITAL_COMMUNITY)
Admission: RE | Admit: 2014-08-08 | Discharge: 2014-08-08 | Disposition: A | Discharge status: Home / Self Care | Payer: Medicare Other | Source: Ambulatory Visit | Attending: Physician Assistant | Admitting: Physician Assistant

## 2014-08-08 DIAGNOSIS — Z720 Tobacco use: Secondary | ICD-10-CM | POA: Insufficient documentation

## 2014-08-08 DIAGNOSIS — R200 Anesthesia of skin: Secondary | ICD-10-CM | POA: Diagnosis not present

## 2014-08-08 DIAGNOSIS — R531 Weakness: Secondary | ICD-10-CM | POA: Insufficient documentation

## 2014-08-08 DIAGNOSIS — R110 Nausea: Secondary | ICD-10-CM | POA: Insufficient documentation

## 2014-08-08 DIAGNOSIS — C3490 Malignant neoplasm of unspecified part of unspecified bronchus or lung: Secondary | ICD-10-CM | POA: Diagnosis not present

## 2014-08-08 MED ORDER — GADOBENATE DIMEGLUMINE 529 MG/ML IV SOLN
20.0000 mL | Freq: Once | INTRAVENOUS | Status: AC | PRN
  Administered 2014-08-08: 16 mL via INTRAVENOUS

## 2014-08-10 ENCOUNTER — Encounter (HOSPITAL_COMMUNITY): Payer: Self-pay

## 2014-08-10 ENCOUNTER — Encounter (HOSPITAL_COMMUNITY)
Admission: RE | Admit: 2014-08-10 | Discharge: 2014-08-10 | Disposition: A | Discharge status: Home / Self Care | Payer: Medicare Other | Source: Ambulatory Visit | Attending: Physician Assistant | Admitting: Physician Assistant

## 2014-08-10 DIAGNOSIS — C3490 Malignant neoplasm of unspecified part of unspecified bronchus or lung: Secondary | ICD-10-CM | POA: Diagnosis not present

## 2014-08-10 LAB — GLUCOSE, CAPILLARY: GLUCOSE-CAPILLARY: 82 mg/dL (ref 70–99)

## 2014-08-10 MED ORDER — FLUDEOXYGLUCOSE F - 18 (FDG) INJECTION
10.3000 | Freq: Once | INTRAVENOUS | Status: AC | PRN
  Administered 2014-08-10: 10.3 via INTRAVENOUS

## 2014-08-14 ENCOUNTER — Ambulatory Visit: Payer: Medicare Other | Admitting: Internal Medicine

## 2014-08-15 ENCOUNTER — Other Ambulatory Visit (HOSPITAL_BASED_OUTPATIENT_CLINIC_OR_DEPARTMENT_OTHER): Payer: Medicare Other

## 2014-08-15 ENCOUNTER — Telehealth: Payer: Self-pay | Admitting: *Deleted

## 2014-08-15 ENCOUNTER — Telehealth: Payer: Self-pay | Admitting: Medical Oncology

## 2014-08-15 ENCOUNTER — Ambulatory Visit (HOSPITAL_BASED_OUTPATIENT_CLINIC_OR_DEPARTMENT_OTHER): Payer: Medicare Other | Admitting: Internal Medicine

## 2014-08-15 ENCOUNTER — Telehealth: Payer: Self-pay | Admitting: Internal Medicine

## 2014-08-15 VITALS — BP 166/82 | HR 101 | Temp 97.5°F | Resp 20 | Ht 69.0 in | Wt 178.2 lb

## 2014-08-15 DIAGNOSIS — C3411 Malignant neoplasm of upper lobe, right bronchus or lung: Secondary | ICD-10-CM

## 2014-08-15 DIAGNOSIS — C3490 Malignant neoplasm of unspecified part of unspecified bronchus or lung: Secondary | ICD-10-CM

## 2014-08-15 DIAGNOSIS — C3491 Malignant neoplasm of unspecified part of right bronchus or lung: Secondary | ICD-10-CM

## 2014-08-15 LAB — COMPREHENSIVE METABOLIC PANEL (CC13)
ALT: 18 U/L (ref 0–55)
AST: 17 U/L (ref 5–34)
Albumin: 3.4 g/dL — ABNORMAL LOW (ref 3.5–5.0)
Alkaline Phosphatase: 92 U/L (ref 40–150)
Anion Gap: 8 mEq/L (ref 3–11)
BUN: 5.6 mg/dL — ABNORMAL LOW (ref 7.0–26.0)
CHLORIDE: 97 meq/L — AB (ref 98–109)
CO2: 30 mEq/L — ABNORMAL HIGH (ref 22–29)
Calcium: 9.3 mg/dL (ref 8.4–10.4)
Creatinine: 0.8 mg/dL (ref 0.7–1.3)
EGFR: 89 mL/min/{1.73_m2} — AB (ref >90)
Glucose: 106 mg/dl (ref 70–140)
Potassium: 4.6 mEq/L (ref 3.5–5.1)
SODIUM: 135 meq/L — AB (ref 136–145)
Total Bilirubin: 0.3 mg/dL (ref 0.20–1.20)
Total Protein: 6.7 g/dL (ref 6.4–8.3)

## 2014-08-15 LAB — CBC WITH DIFFERENTIAL/PLATELET
BASO%: 0.3 % (ref 0.0–2.0)
BASOS ABS: 0 10*3/uL (ref 0.0–0.1)
EOS ABS: 0.1 10*3/uL (ref 0.0–0.5)
EOS%: 1.3 % (ref 0.0–7.0)
HCT: 43.4 % (ref 38.4–49.9)
HEMOGLOBIN: 14.4 g/dL (ref 13.0–17.1)
LYMPH#: 2 10*3/uL (ref 0.9–3.3)
LYMPH%: 21.2 % (ref 14.0–49.0)
MCH: 31.8 pg (ref 27.2–33.4)
MCHC: 33.2 g/dL (ref 32.0–36.0)
MCV: 95.8 fL (ref 79.3–98.0)
MONO#: 0.8 10*3/uL (ref 0.1–0.9)
MONO%: 9 % (ref 0.0–14.0)
NEUT#: 6.3 10*3/uL (ref 1.5–6.5)
NEUT%: 68.2 % (ref 39.0–75.0)
Platelets: 265 10*3/uL (ref 140–400)
RBC: 4.53 10*6/uL (ref 4.20–5.82)
RDW: 13.1 % (ref 11.0–14.6)
WBC: 9.2 10*3/uL (ref 4.0–10.3)

## 2014-08-15 NOTE — Telephone Encounter (Signed)
Called and spoke with patient.  Patient has an appt with Dr. Darcey Nora on 08/21/14 at 9:30.  He verbalized understanding of appt time and place.

## 2014-08-15 NOTE — Telephone Encounter (Signed)
[facility] notified of referral for oxygen,.

## 2014-08-15 NOTE — Telephone Encounter (Signed)
gv and printed appt sched and avs for pt for Feb

## 2014-08-15 NOTE — Progress Notes (Signed)
Pt desaturating to 88% while ambulating on room air. Became very sob. Oxygen 2 liters applied and oxygen sat increased to 94% while ambulating. Referred pt to [facility] for oxygen therapy.

## 2014-08-15 NOTE — Progress Notes (Signed)
[name] [phone]   [phone]  OFFICE PROGRESS NOTE  Darren Miyamoto, MD [place] [address]  DIAGNOSIS: Stage IIA (T2 B., N0, M0) non-small cell lung cancer, squamous cell carcinoma diagnosed in December of 2013   PRIOR THERAPY:  1) Status post right upper lobectomy under the care of Dr. Prescott Gum on 09/14/2012.  2) Adjuvant chemotherapy with cisplatin 75 mg/M2 and docetaxel 75 mg/M2 with Neulasta support every 3 weeks, status post 1 cycle. Starting with cycle #2, patient will receive carboplatin for an AUC initially of 4.5 and paclitaxel at 175 mg per meter squared with Neulasta support given every 3 weeks. Status post a total of 3 cycles.   CURRENT THERAPY: Observation.   CHEMOTHERAPY INTENT: Curative  CURRENT # OF CHEMOTHERAPY CYCLES: 0  CURRENT ANTIEMETICS: Zofran, dexamethasone and Compazine  CURRENT SMOKING STATUS: Current smoker and was strongly advised to quit smoking and altered smoke cessation program.  ORAL CHEMOTHERAPY AND CONSENT: None  CURRENT BISPHOSPHONATES USE: None  PAIN MANAGEMENT: 3/10 controlled by Norco when necessary  NARCOTICS INDUCED CONSTIPATION: Milk of magnesia on as-needed basis.  LIVING WILL AND CODE STATUS: No CODE BLUE.   INTERVAL HISTORY:  [name] 74 y.o. male returns to the clinic today for followup visit accompanied his wife and daughter. The patient has been complaining of increasing shortness of breath and his oxygen saturation today was in the range of 88% while ambulating on room air. He denied having any significant chest pain but has mild cough with no hemoptysis. Unfortunately he continues to smoke. He denied having any significant nausea or vomiting, no fever or chills, no weight loss or night sweats. His most recent CT scan of the chest showed evidence for disease progression in the chest. I ordered a PET scan as well as MRI of the brain and he is here today for evaluation  and discussion of these results.  MEDICAL HISTORY: Past Medical History  Diagnosis Date  . Arthritis   . Wheezing   . Sore throat   . Carotid artery occlusion   . COPD (chronic obstructive pulmonary disease)   . Lumbar disc disease   . Restless leg syndrome   . Allergic rhinitis   . Hypertension   . Anxiety     anxiety  . Shortness of breath   . Lung mass   . Pneumonia     ? now  . GERD (gastroesophageal reflux disease)   . H/O hiatal hernia   . Pre-operative cardiovascular examination   . Nonspecific abnormal unspecified cardiovascular function study   . Peripheral vascular disease, unspecified   . Cancer     lung mass    ALLERGIES:  has No Known Allergies.  MEDICATIONS:  Current Outpatient Prescriptions  Medication Sig Dispense Refill  . aspirin EC 81 MG tablet Take 81 mg by mouth daily. Takes one tablet every evening.    . clonazePAM (KLONOPIN) 0.5 MG tablet Take 0.5 mg by mouth 2 (two) times daily as needed for anxiety. Takes one tablet by mouth two times daily as needed for anxiety.    . diphenhydramine-acetaminophen (TYLENOL PM) 25-500 MG TABS Take 1 tablet by mouth at bedtime as needed.    . formoterol (FORADIL AEROLIZER) 12 MCG capsule for inhaler Place 1 capsule (12 mcg total) into inhaler and inhale 2 (two) times daily. 60 capsule 5  . HYDROcodone-acetaminophen (NORCO) 7.5-325 MG per tablet Take 1 tablet by mouth  every 4 (four) hours as needed for pain. 40 tablet 0  . isosorbide mononitrate (IMDUR) 30 MG 24 hr tablet Take 1 tablet (30 mg total) by mouth daily. 90 tablet 3  . omeprazole (PRILOSEC) 40 MG capsule Take 40 mg by mouth daily.    . pramipexole (MIRAPEX) 1 MG tablet Take 1 mg by mouth at bedtime.    . promethazine-codeine (PHENERGAN WITH CODEINE) 6.25-10 MG/5ML syrup Take 5 mLs by mouth every 6 (six) hours as needed for cough. 120 mL 0  . Tiotropium Bromide Monohydrate (SPIRIVA RESPIMAT) 2.5 MCG/ACT AERS Inhale 2 puffs into the lungs daily. 1 Inhaler 5    . zolpidem (AMBIEN) 5 MG tablet Take 5 mg by mouth at bedtime as needed.    . nitroGLYCERIN (NITROSTAT) 0.4 MG SL tablet Place 1 tablet (0.4 mg total) under the tongue every 5 (five) minutes as needed for chest pain. (Patient not taking: Reported on 08/15/2014) 25 tablet 5  . UNABLE TO FIND 3 differeny eye drops- having cataract surgery 08/05/14.    Marland Kitchen zolpidem (AMBIEN) 10 MG tablet Take 10 mg by mouth at bedtime as needed for sleep.     No current facility-administered medications for this visit.    SURGICAL HISTORY:  Past Surgical History  Procedure Laterality Date  . Carotid endarterectomy  10/15/2010    left  . Hand surgery      repair of the left long, ring, and small finger after a saw injury  . Video bronchoscopy  07/24/2012    Procedure: VIDEO BRONCHOSCOPY WITH FLUORO;  Surgeon: Kathee Delton, MD;  Location: [facility];  Service: Cardiopulmonary;  Laterality: Bilateral;  . Lobectomy Right 09/14/2012    Procedure: LOBECTOMY;  Surgeon: Ivin Poot, MD;  Location: [facility];  Service: Thoracic;  Laterality: Right;  RIGHT UPPER LOBECTOMY  . Video assisted thoracoscopy Right 09/14/2012    Procedure: VIDEO ASSISTED THORACOSCOPY;  Surgeon: Ivin Poot, MD;  Location: [facility];  Service: Thoracic;  Laterality: Right;    REVIEW OF SYSTEMS:  Constitutional: positive for fatigue Eyes: negative Ears, nose, mouth, throat, and face: negative Respiratory: positive for cough and dyspnea on exertion Cardiovascular: negative Gastrointestinal: negative Genitourinary:negative Integument/breast: negative Hematologic/lymphatic: negative Musculoskeletal:negative Neurological: negative Behavioral/Psych: negative Endocrine: negative Allergic/Immunologic: negative   PHYSICAL EXAMINATION: General appearance: alert, cooperative and no distress Head: Normocephalic, without obvious abnormality, atraumatic Neck: no adenopathy, no JVD, supple, symmetrical, trachea midline and thyroid not enlarged,  symmetric, no tenderness/mass/nodules Lymph nodes: Cervical, supraclavicular, and axillary nodes normal. Resp: wheezes bilaterally Back: symmetric, no curvature. ROM normal. No CVA tenderness. Cardio: regular rate and rhythm, S1, S2 normal, no murmur, click, rub or gallop GI: soft, non-tender; bowel sounds normal; no masses,  no organomegaly Extremities: extremities normal, atraumatic, no cyanosis or edema Neurologic: Alert and oriented X 3, normal strength and tone. Normal symmetric reflexes. Normal coordination and gait  ECOG PERFORMANCE STATUS: 1 - Symptomatic but completely ambulatory  Blood pressure 166/82, pulse 101, temperature 97.5 F (36.4 C), temperature source Oral, resp. rate 20, height 5\' 9"  (1.753 m), weight 178 lb 3.2 oz (80.831 kg), SpO2 97 %.  LABORATORY DATA: Lab Results  Component Value Date   WBC 9.2 08/15/2014   HGB 14.4 08/15/2014   HCT 43.4 08/15/2014   MCV 95.8 08/15/2014   PLT 265 08/15/2014      Chemistry      Component Value Date/Time   NA 135* 08/15/2014 1036   NA 133* 03/07/2014 0918   K 4.6 08/15/2014  1036   K 4.2 03/07/2014 0918   CL 96 03/07/2014 0918   CL 101 12/28/2012 0852   CO2 30* 08/15/2014 1036   CO2 32 03/07/2014 0918   BUN 5.6* 08/15/2014 1036   BUN 9 03/07/2014 0918   CREATININE 0.8 08/15/2014 1036   CREATININE 0.8 03/07/2014 0918      Component Value Date/Time   CALCIUM 9.3 08/15/2014 1036   CALCIUM 9.2 03/07/2014 0918   ALKPHOS 92 08/15/2014 1036   ALKPHOS 80 09/16/2012 0420   AST 17 08/15/2014 1036   AST 35 09/16/2012 0420   ALT 18 08/15/2014 1036   ALT 19 09/16/2012 0420   BILITOT 0.30 08/15/2014 1036   BILITOT 0.4 09/16/2012 0420       RADIOGRAPHIC STUDIES: Ct Chest W Contrast  08/01/2014   CLINICAL DATA:  History of lung cancer diagnosed 2013. Status post right upper lobe lobectomy. Chest pain, cough and shortness of Breath.  EXAM: CT CHEST WITH CONTRAST  TECHNIQUE: Multidetector CT imaging of the chest was  performed during intravenous contrast administration.  CONTRAST:  25mL OMNIPAQUE IOHEXOL 300 MG/ML  SOLN  COMPARISON:  01/31/2014  FINDINGS: Chest wall: No chest wall mass. There are new/enlarged right subpectoral and right axillary lymph nodes. The largest lymph node on image number 16 has a transverse diameter 13 mm. The bony thorax is intact. No destructive bone lesions or spinal canal compromise.  Mediastinum: There are enlarging mediastinal lymph nodes. 11 mm right paratracheal node on image 12 measures 11 mm. A right-sided paraesophageal lymph node on image number 10 measures 10 mm. There are clusters of enlarging lymph nodes in the superior mediastinum and also in the prevascular space. There are enlarging aortic 0 pulmonary window nodes and subcarinal nodes. There is also new/progressive hilar adenopathy bilaterally. The right hilar adenopathy in particular is worrisome for recurrent disease and this measures a maximum of 14 mm.  The heart is normal in size. No pericardial effusion. Stable dense 3 vessel coronary artery calcifications. The aorta is normal in caliber. No dissection. Stable atherosclerotic calcifications involving the aorta and branch vessels.  Lungs: There are several small bilateral pulmonary nodules along with a larger 11 mm nodule in the right lower lobe on image number 34. There is also a 9 mm nodule in the right upper lobe on image 14. Some of these appear to be associated with an atypical infectious process such as MAC with peribronchial thickening and tree-in-bud appearance. Most of these certainly could be infectious airspace nodules but based on the other findings in the chest are certainly suspicious for metastatic disease. No pleural effusions are identified.  Upper abdomen: No findings for upper abdominal metastatic disease. Advanced atherosclerotic calcifications involving the abdominal aorta are noted.  IMPRESSION: 1. Mediastinal, hilar and right axillary adenopathy are  worrisome for recurrent neoplasm. There are also some worrisome pulmonary nodules in the lungs suspicious for metastasis. However, there is also a superimposed atypical infectious process with small airspace nodules, peribronchial thickening and tree-in-bud appearance. All the above findings could be due to atypical infection but I think it is unlikely. I would be more worried about metastatic disease with superimposed atypical infectious process. 2. PET-CT may be helpful for directing a potential biopsy. Alternatively, working the patient up for atypical infection, treating the patient and then getting a short-term chest CT (4-6 weeks) would be another alternative.   Electronically Signed   By: Kalman Jewels M.D.   On: 08/01/2014 12:57   Mr [name] WG  Contrast  08/08/2014   CLINICAL DATA:  Non-small cell lung cancer restaging. Numbness, weakness, and nausea.  EXAM: MRI HEAD WITHOUT AND WITH CONTRAST  TECHNIQUE: Multiplanar, multiecho pulse sequences of the brain and surrounding structures were obtained without and with intravenous contrast.  CONTRAST:  46mL MULTIHANCE GADOBENATE DIMEGLUMINE 529 MG/ML IV SOLN  COMPARISON:  08/03/2012  FINDINGS: There is no evidence of acute infarct, intracranial hemorrhage, mass, midline shift, or extra-axial fluid collection. There is mild generalized cerebral atrophy, unchanged. Small, scattered foci of T2 hyperintensity in the cerebral white matter and pons are unchanged and nonspecific but compatible with minimal chronic small vessel ischemic disease.  There is a 2 mm enhancing focus in the right parietal cortex (series 11 image 44, series 13 image 6, series 12 images 6-7). This is not identified on the prior study, although there was more motion artifact on that examination. No other enhancing lesions are identified on the current examination.  Prior right cataract extraction is noted. There is minimal bilateral maxillary sinus mucosal thickening. Trace left mastoid  effusion is noted. Major intracranial vascular flow voids are preserved.  IMPRESSION: New 2 mm enhancing focus in the right parietal lobe, suspicious for solitary metastasis.   Electronically Signed   By: Logan Bores   On: 08/08/2014 17:01   Dg Esophagus  07/19/2014   CLINICAL DATA:  Difficulty swallowing.  EXAM: ESOPHOGRAM / BARIUM SWALLOW / BARIUM TABLET STUDY  TECHNIQUE: Combined double contrast and single contrast examination performed using effervescent crystals, thick barium liquid, and thin barium liquid. The patient was observed with fluoroscopy swallowing a 28mm barium sulphate tablet.  FLUOROSCOPY TIME:  1 min 0 seconds  COMPARISON:  10/26/2011  FINDINGS: The oropharyngeal swallowing mechanisms are normal. There is no aspiration. The mucosa and motility of the esophagus are normal. There is a small sliding hiatal hernia. There is no stricture or mass or esophagitis.  A 13 mm barium tablet passed immediately from the mouth to the stomach with no delay.  IMPRESSION: Normal barium esophagram except for a small sliding hiatal hernia, unchanged.   Electronically Signed   By: Rozetta Nunnery M.D.   On: 07/19/2014 11:01   Nm Pet Image Restag (ps) Skull Base To Thigh  08/12/2014   CLINICAL DATA:  Subsequent treatment strategy for non-small cell lung cancer.  EXAM: NUCLEAR MEDICINE PET SKULL BASE TO THIGH  TECHNIQUE: 10.3 mCi F-18 FDG was injected intravenously. Full-ring PET imaging was performed from the skull base to thigh after the radiotracer. CT data was obtained and used for attenuation correction and anatomic localization.  FASTING BLOOD GLUCOSE:  Value: 82 mg/dl  COMPARISON:  Chest CT 08/01/2014.  PET-CT 07/19/2012.  FINDINGS: NECK  Multiple right-sided supraclavicular lymph nodes are borderline enlarged mildly enlarged (up to 11 mm in short axis), and are hypermetabolic (SUVmax = 93.7).  CHEST  Multiple borderline enlarged and mildly enlarged right axillary lymph and subpectoral nodes (measuring up to  12 mm in short axis) are hypermetabolic (SUVmax = 1.6-96.7). Extensive hypermetabolic bilateral hilar and mediastinal lymphadenopathy is noted. Specific examples include a 13 mm subcarinal lymph node (SUVmax = 89.3), hypermetabolic right hilar lymph nodes (SUVmax = 81.0), a hypermetabolic left hilar lymph nodes (SUVmax = 7.9)and bilateral paratracheal lymphadenopathy (SUVmax = 6.8- 7.7). As with the recent CT scan there are innumerable small pulmonary nodules scattered throughout the lungs bilaterally. A large number of these are hypermetabolic, with specific examples including a 11 x 9 mm pleural-based hypermetabolic (SUVmax = 17.5)ZWCHEN in the posterior  aspect of the superior segment of the right lower lobe, and a 12 x 10 mm pleural-based nodule (image 41 of series 8) in the posterolateral aspect of the right lower lobe (SUVmax = 5.1). Multiple other smaller lesions and areas of hypermetabolic ground-glass attenuation. Extensive patchy nodular thickening of the peribronchovascular interstitium, concerning for widespread lymphangitic spread of disease. Status post right upper lobectomy. No pleural effusions.  ABDOMEN/PELVIS  Hypermetabolism in the right adrenal gland without definite nodule (SUVmax = 5.4), concerning for a small metastasis. No abnormal hypermetabolic activity within the liver, pancreas, left adrenal gland, or spleen. No hypermetabolic lymph nodes in the abdomen or pelvis. Extensive atherosclerosis throughout the abdominal and pelvic vasculature, without definite aneurysm. Ectasia of the infrarenal abdominal aorta up to 2.5 cm in diameter. Normal appendix.  SKELETON  No focal hypermetabolic activity to suggest skeletal metastasis.  IMPRESSION: 1. 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 2. Additional incidental findings, as above.   Electronically Signed   By: Vinnie Langton M.D.   On: 08/12/2014 09:06    ASSESSMENT AND PLAN: This is a very pleasant 74 years old white male with history of stage IIa non-small cell lung cancer status post right upper lobectomy followed by 4 cycles of adjuvant chemotherapy.  He now presented with evidence for significant disease recurrence including lymphangitic spread of the tumor in addition to bilateral hilar and mediastinal lymphadenopathy in addition to right axillary or right subpectoral and right supraclavicular lymphadenopathy and right sided adrenal metastasis. He also has a very small brain lesion measuring 2 mm. I had a lengthy discussion with the patient and his family today about his condition and showed them the images of the PET scan as well as MRI of the brain. I recommended for the patient to see Dr. Prescott Gum for consideration of repeat bronchoscopy with endoscopic ultrasound and biopsy of the enlarged lymphadenopathy for tissue diagnosis. I would see him back for follow-up visit in 2 weeks for reevaluation and more detailed discussion of his treatment based on the final pathology. His brain lesion is very small and I will continue to monitor this for now as the patient is asymptomatic but would consider referring him to radiation oncology if this lesion enlarged or the patient becomes symptomatic. For dyspnea, this is most likely secondary to his disease recurrence. We'll start the patient on home oxygen. For smoke cessation, I strongly encouraged the patient to quit smoking and offered him to smoke cessation program but he declined. He was advised to call immediately if he has any concerning symptoms in the interval.  The patient voices understanding of current disease status and treatment options and is in agreement with the current  care plan.  All questions were answered. The patient knows to call the clinic with any problems, questions or concerns. We can certainly see the patient much sooner if necessary.  Disclaimer: This note was dictated with voice recognition software. Similar sounding words can inadvertently be transcribed and may not be corrected upon review.

## 2014-08-16 ENCOUNTER — Telehealth: Payer: Self-pay | Admitting: Medical Oncology

## 2014-08-16 NOTE — Telephone Encounter (Signed)
-----   Message from Berton Mount sent at 08/16/2014  4:18 PM EST ----- Hi Olaoluwa Grieder!  Thank you for the oxygen referral on Mr Guzzetta.  Did you happen to get the CMN we faxed over for Dr Earlie Server to sign?  Just let me know.  Thanks!  Mandy with [employer] [facility] [phone]

## 2014-08-16 NOTE — Telephone Encounter (Signed)
I told [name] I faxed form back.

## 2014-08-17 ENCOUNTER — Encounter: Payer: Self-pay | Admitting: Internal Medicine

## 2014-08-21 ENCOUNTER — Encounter: Payer: Self-pay | Admitting: Cardiothoracic Surgery

## 2014-08-21 ENCOUNTER — Telehealth: Payer: Self-pay | Admitting: Internal Medicine

## 2014-08-21 ENCOUNTER — Encounter: Payer: Self-pay | Admitting: *Deleted

## 2014-08-21 ENCOUNTER — Ambulatory Visit (INDEPENDENT_AMBULATORY_CARE_PROVIDER_SITE_OTHER): Payer: Medicare Other | Admitting: Cardiothoracic Surgery

## 2014-08-21 ENCOUNTER — Other Ambulatory Visit: Payer: Self-pay | Admitting: *Deleted

## 2014-08-21 VITALS — BP 150/86 | HR 84 | Resp 16 | Ht 69.0 in | Wt 179.0 lb

## 2014-08-21 DIAGNOSIS — C719 Malignant neoplasm of brain, unspecified: Secondary | ICD-10-CM

## 2014-08-21 DIAGNOSIS — R948 Abnormal results of function studies of other organs and systems: Secondary | ICD-10-CM

## 2014-08-21 DIAGNOSIS — Z902 Acquired absence of lung [part of]: Secondary | ICD-10-CM

## 2014-08-21 DIAGNOSIS — C3411 Malignant neoplasm of upper lobe, right bronchus or lung: Secondary | ICD-10-CM

## 2014-08-21 DIAGNOSIS — R590 Localized enlarged lymph nodes: Secondary | ICD-10-CM

## 2014-08-21 DIAGNOSIS — Z9889 Other specified postprocedural states: Secondary | ICD-10-CM | POA: Diagnosis not present

## 2014-08-21 NOTE — Progress Notes (Signed)
Patient ID: [name], male   DOB: 02/26/1941, 74 y.o.   MRN: [id number]      [address]       [place], [address]             [phone]        [facility] [place] Medical Record #[id number] Date of Birth: 1941/04/13  Referring: Curt Bears, MD Primary Care: [name], MD  Chief Complaint:   Shortness of breath loss of appetite and weakness Chief Complaint  Patient presents with  . 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 Routine followup by [name] with PET scan showed possible new lesions with mediastinal adenopathy showing increased uptake consistent with metastatic mediastinal disease as well as right supralevator node and bilateral hypermetabolic peripheral nodules. Tissue is requested by his oncologist prior to initiating therapy. There is also an abnormal brain lesion  n MRI consistent with metastatic disease.   Current Activity/ Functional Status: Patient lives with wife he is retired Is unable to tolerate any exertional activity without shortness of breath He has had some weight loss   Zubrod Score: At the time of surgery this patient's most appropriate activity status/level should be described as: []     0    Normal activity, no symptoms []     1    Restricted in physical strenuous activity but ambulatory, able to do out light work [x]     2    Ambulatory and capable of self care, unable to do work activities, up and about                 more than 50%  Of the time                            []     3    Only limited self care,  in bed greater than 50% of waking hours []     4    Completely disabled, no self care, confined to bed or chair []     5    Moribund  Past Medical History  Diagnosis Date  . Arthritis   . Wheezing   . Sore throat   . Carotid artery occlusion   . COPD (chronic obstructive pulmonary disease)   . Lumbar disc disease   . Restless leg syndrome   . Allergic rhinitis   . Hypertension   . Anxiety     anxiety  . Shortness of breath   . Lung mass   . Pneumonia     ? now  . GERD (gastroesophageal reflux disease)   . H/O hiatal hernia   . Pre-operative cardiovascular examination   . Nonspecific abnormal unspecified cardiovascular function study   . Peripheral vascular disease, unspecified   . Cancer     lung mass    Past Surgical History  Procedure Laterality Date  . Carotid endarterectomy  10/15/2010    left  . Hand surgery      repair of the left long, ring, and small finger after a saw  injury  . Video bronchoscopy  07/24/2012    Procedure: VIDEO BRONCHOSCOPY WITH FLUORO;  Surgeon: Kathee Delton, MD;  Location: [facility];  Service: Cardiopulmonary;  Laterality: Bilateral;  . Lobectomy Right 09/14/2012    Procedure: LOBECTOMY;  Surgeon: Ivin Poot, MD;  Location: [place];  Service: Thoracic;  Laterality: Right;  RIGHT UPPER LOBECTOMY  . Video assisted thoracoscopy Right 09/14/2012    Procedure: VIDEO ASSISTED THORACOSCOPY;  Surgeon: Ivin Poot, MD;  Location: [facility];  Service: Thoracic;  Laterality: Right;    History  Smoking status  . Current Every Day Smoker -- 1.00 packs/day for 55 years  . Types: Cigarettes  Smokeless tobacco  . Former Systems developer  . Quit date: 04/19/1952   History  Alcohol Use  . 4.2 oz/week  . 7 Cans of beer per week    History   Social History  . Marital Status: Married    Spouse Name: N/A  . Number of Children: Y  . Years of Education: N/A   Occupational History  . works in Education administrator    Social History Main Topics  . Smoking status: Current  Every Day Smoker -- 1.00 packs/day for 55 years    Types: Cigarettes  . Smokeless tobacco: Former Systems developer    Quit date: 04/19/1952  . Alcohol Use: 4.2 oz/week    7 Cans of beer per week  . Drug Use: No  . Sexual Activity: Not on file   Other Topics Concern  . Not on file   Social History Narrative    No Known Allergies  Current Outpatient Prescriptions  Medication Sig Dispense Refill  . aspirin EC 81 MG tablet Take 81 mg by mouth daily. Takes one tablet every evening.    . clonazePAM (KLONOPIN) 0.5 MG tablet Take 0.5 mg by mouth 2 (two) times daily as needed for anxiety. Takes one tablet by mouth two times daily as needed for anxiety.    . diphenhydramine-acetaminophen (TYLENOL PM) 25-500 MG TABS Take 1 tablet by mouth at bedtime as needed.    . formoterol (FORADIL AEROLIZER) 12 MCG capsule for inhaler Place 1 capsule (12 mcg total) into inhaler and inhale 2 (two) times daily. 60 capsule 5  . HYDROcodone-acetaminophen (NORCO) 7.5-325 MG per tablet Take 1 tablet by mouth every 4 (four) hours as needed for pain. 40 tablet 0  . isosorbide mononitrate (IMDUR) 30 MG 24 hr tablet Take 1 tablet (30 mg total) by mouth daily. 90 tablet 3  . nitroGLYCERIN (NITROSTAT) 0.4 MG SL tablet Place 1 tablet (0.4 mg total) under the tongue every 5 (five) minutes as needed for chest pain. 25 tablet 5  . omeprazole (PRILOSEC) 40 MG capsule Take 40 mg by mouth daily.    . pramipexole (MIRAPEX) 1 MG tablet Take 1 mg by mouth at bedtime.    . promethazine-codeine (PHENERGAN WITH CODEINE) 6.25-10 MG/5ML syrup Take 5 mLs by mouth every 6 (six) hours as needed for cough. 120 mL 0  . Tiotropium Bromide Monohydrate (SPIRIVA RESPIMAT) 2.5 MCG/ACT AERS Inhale 2 puffs into the lungs daily. 1 Inhaler 5  . UNABLE TO FIND 3 differeny eye drops- having cataract surgery 08/05/14.    Marland Kitchen zolpidem (AMBIEN) 10 MG tablet Take 10 mg by mouth at bedtime as needed for sleep.    Marland Kitchen zolpidem (AMBIEN) 5 MG tablet Take 5 mg by mouth at  bedtime as needed.     No current facility-administered medications for this visit.     (Not in  a hospital admission)  Family History  Problem Relation Age of Onset  . Heart disease Father     MI  . Heart attack Father   . Alcohol abuse Mother   . Stroke Brother   . Heart disease Brother   . Depression Sister   . Heart attack Brother      Review of Systems:       Cardiac Review of Systems: Y or N  Chest Pain [  no  ]  Resting SOB [ yes  ] Exertional SOB  yes[  ]  Orthopnea [ no ]   Pedal Edema [  no]    Palpitations [no  ] Syncope  [no  ]   Presyncope [ no  ]  General Review of Systems: [Y] = yes [  ]=no Constitional: recent weight change [weight loss  ]; anorexia [  ]; fatigue [place].Blacker  ]; nausea [  ]; night sweats [  ]; fever [  ]; or chills [  ]                                                               Dental: poor dentition[  ]; Last Dentist visit: greater than 6 months  Eye : blurred vision [  ]; diplopia [   ]; vision changes [  ];  Amaurosis fugax[  ];no headache Resp: cough [place].Blacker  ];  wheezing[yes  ];  hemoptysis[  ]; shortness of breath[ yes ]; paroxysmal nocturnal dyspnea[  ]; dyspnea on exertion[ yes ]; or orthopnea[  ];  GI:  gallstones[  ], vomiting[  ];  dysphagia[  ]; melena[  ];  hematochezia [  ]; heartburn[  ];   Hx of  Colonoscopy[  ]; GU: kidney stones [  ]; hematuria[  ];   dysuria [  ];  nocturia[  ];  history of     obstruction [  ]; urinary frequency [  ]             Skin: rash, swelling[  ];, hair loss[  ];  peripheral edema[  ];  or itching[  ]; Musculosketetal: myalgias[  ];  joint swelling[  ];  joint erythema[  ];  joint pain[  ];  back pain[  ];  Heme/Lymph: bruising[  ];  bleeding[  ];  anemia[  ];  Neuro: TIA[  ];  headaches[  ];  stroke[  ];  vertigo[  ];  seizures[  ];   paresthesias[  ];  difficulty walking[  ];  Psych:depression[  ]; anxiety[  ];  Endocrine: diabetes[  ];  thyroid dysfunction[  ];  Immunizations: Flu [  ]; Pneumococcal[   ];  Other:  Physical Exam: BP 150/86 mmHg  Pulse 84  Resp 16  Ht 5\' 9"  (1.753 m)  Wt 179 lb (81.194 kg)  BMI 26.42 kg/m2  SpO2 92%    General: elderly thin Caucasian male accompanied by his wife no acute distress but obviously with COPD HEENT: Normocephalic pupils equal , dentition adequate Neck: Supple without JVD, right supraclavicular firmness on palpation no bruit Chest: Clear to auscultation, symmetrical breath sounds, scattered rhonchi, no tenderness             or deformity. Well-healed right VATS incisionCardiovascular: Regular rate and rhythm, no  murmur, no gallop, peripheral pulses             palpable in all extremities Abdomen:  Soft, nontender, no palpable mass or organomegaly Extremities: Warm, well-perfused, no clubbing cyanosis edema or tenderness,positive clubbing of nailbeds              no venous stasis changes of the legs Rectal/GU: Deferred Neuro: Grossly non--focal and symmetrical throughout Skin: Clean and dry without rash or ulceration   Diagnostic Studies & Laboratory data:     Recent Radiology Findings:  PET scan and CT scan personally reviewed and discussed with patient and family showing abnormal used oh adenopathy especially in the subcarinal lymph node station with significant hypermetabolic activity, right supraclavicular lymph node with hypermetabolic activity and bilateral peripheral poor nodules with hypermetabolic activity consistent with recurrent or new cardiogenic carcinoma  I have independently reviewed the above radiologic studies.  Recent Lab Findings: Lab Results  Component Value Date   WBC 9.2 08/15/2014   HGB 14.4 08/15/2014   HCT 43.4 08/15/2014   PLT 265 08/15/2014   GLUCOSE 106 08/15/2014   CHOL 145 01/17/2011   TRIG 66 01/17/2011   HDL 57 01/17/2011   LDLCALC 75 01/17/2011   ALT 18 08/15/2014   AST 17 08/15/2014   NA 135* 08/15/2014   K 4.6 08/15/2014   CL 96 03/07/2014   CREATININE 0.8 08/15/2014   BUN 5.6*  08/15/2014   CO2 30* 08/15/2014   TSH 0.893 01/15/2011   INR 0.87 09/01/2012   HGBA1C  10/08/2010    5.6 (NOTE)                                                                       According to the ADA Clinical Practice Recommendations for 2011, when HbA1c is used as a screening test:   >=6.5%   Diagnostic of Diabetes Mellitus           (if abnormal result  is confirmed)  5.7-6.4%   Increased risk of developing Diabetes Mellitus  References:Diagnosis and Classification of Diabetes Mellitus,Diabetes BJYN,8295,62(ZHYQM 1):S62-S69 and Standards of Medical Care in         Diabetes - 2011,Diabetes Care,2011,34  (Suppl 1):S11-S61.      Assessment / Plan:     The patient will need pathologic staging before chemotherapy. He'll be scheduled for bronchoscopy with  EBUS to aspirate the mediastinal subcarinal abnormal lymph nodes. If the quick prep is negative while the patient is under general anesthesia we will sample the right subclavicular lymph node. I've discussed the procedure in detail the patient and his wife including use of general anesthesia and the location of a potential right subclavicular incision. This we scheduled in approximately one week. The patient is counseled to remain smoking free until surgery to reduce postoperative risks of pneumonia or other pulmonary problems. Surgery scheduled for every 16 at [place]     I  spent 45 counseling the patient face to face and 50% or more the  time was spent in counseling and coordination of care. The total time spent was 60 minutes.    @ME1 @ 08/21/2014 7:14 PM

## 2014-08-21 NOTE — Telephone Encounter (Signed)
s.w. pt wife and advised on 2.16cx and moved to 2.25 per pof

## 2014-08-23 ENCOUNTER — Encounter (HOSPITAL_COMMUNITY): Payer: Self-pay | Admitting: *Deleted

## 2014-08-23 ENCOUNTER — Encounter (HOSPITAL_COMMUNITY): Payer: Self-pay | Admitting: Pharmacy Technician

## 2014-08-23 NOTE — Progress Notes (Signed)
   08/23/14 1407  OBSTRUCTIVE SLEEP APNEA  Have you ever been diagnosed with sleep apnea through a sleep study? No  Do you snore loudly (loud enough to be heard through closed doors)?  1  Do you often feel tired, fatigued, or sleepy during the daytime? 1  Has anyone observed you stop breathing during your sleep? 0  Do you have, or are you being treated for high blood pressure? 1  BMI more than 35 kg/m2? 0  Age over 72 years old? 1  Neck circumference greater than 40 cm/16 inches? 0 (16)  Gender: 1  Obstructive Sleep Apnea Score 5  Score 4 or greater  Results sent to PCP

## 2014-08-25 ENCOUNTER — Telehealth: Payer: Self-pay | Admitting: Internal Medicine

## 2014-08-25 NOTE — Telephone Encounter (Signed)
Received call from patient's wife that he is having difficulty breathing. Symptoms - sob, cough, increase sputum production (thick white) progressively getting worst over the past week. No fever, no chills.  Using duoneb once per day Patient received a concentrator prescribed by Dr. Earlie Server (oncology), currently wearing 1.5L with a portable pulse ox saturation of 96%. Patient is scheduled for a lung biopsy of on Tuesday.  Currently wife is requesting steroids and possible antibiotics.  Recommendations 1. Use home duoneb every 4-6 hours 2. Continue with 1.5L supplemental oxygen 3. If clinical symptoms are worst overnight then go to urgent care\ED 4. Acute care visit to [facility] on 08/26/14 (Monday).

## 2014-08-26 NOTE — Telephone Encounter (Signed)
Please see if myself or TP have any open slots to get this pt in for an acute visit either on 2/16 or 2/17 (preferrably tomorrow).

## 2014-08-26 NOTE — Telephone Encounter (Signed)
Spoke with pt's wife.  Offered to schedule appt tomorrow with KC/TP.  Pt has bx tomorrow am at [facility].  Offered to schedule appt Wednesday - wife requesting to hold off on appt at this time bc she is unsure if pt will be sent home tomorrow after bx or will be admitted for observation.  Wife also reports pt is using duoneb q4-6h and is almost back to baseline - not coughing as much and SOB has improved.  BP has came down from yesterday.  Advised would send to [facility] as FYI.  She verbalized understanding.

## 2014-08-27 ENCOUNTER — Ambulatory Visit (HOSPITAL_COMMUNITY): Payer: Medicare Other | Admitting: Certified Registered"

## 2014-08-27 ENCOUNTER — Ambulatory Visit: Payer: Medicare Other | Admitting: Physician Assistant

## 2014-08-27 ENCOUNTER — Ambulatory Visit (HOSPITAL_COMMUNITY): Payer: Medicare Other

## 2014-08-27 ENCOUNTER — Encounter (HOSPITAL_COMMUNITY): Payer: Self-pay | Admitting: *Deleted

## 2014-08-27 ENCOUNTER — Encounter (HOSPITAL_COMMUNITY)
Admission: RE | Disposition: A | Discharge status: Home / Self Care | Payer: Self-pay | Source: Ambulatory Visit | Attending: Cardiothoracic Surgery

## 2014-08-27 ENCOUNTER — Ambulatory Visit (HOSPITAL_COMMUNITY)
Admission: RE | Admit: 2014-08-27 | Discharge: 2014-08-27 | Disposition: A | Discharge status: Home / Self Care | Payer: Medicare Other | Source: Ambulatory Visit | Attending: Cardiothoracic Surgery | Admitting: Cardiothoracic Surgery

## 2014-08-27 ENCOUNTER — Other Ambulatory Visit: Payer: Medicare Other

## 2014-08-27 DIAGNOSIS — C770 Secondary and unspecified malignant neoplasm of lymph nodes of head, face and neck: Secondary | ICD-10-CM | POA: Diagnosis not present

## 2014-08-27 DIAGNOSIS — I739 Peripheral vascular disease, unspecified: Secondary | ICD-10-CM | POA: Diagnosis not present

## 2014-08-27 DIAGNOSIS — R590 Localized enlarged lymph nodes: Secondary | ICD-10-CM | POA: Diagnosis present

## 2014-08-27 DIAGNOSIS — Z87891 Personal history of nicotine dependence: Secondary | ICD-10-CM | POA: Diagnosis not present

## 2014-08-27 DIAGNOSIS — R0602 Shortness of breath: Secondary | ICD-10-CM

## 2014-08-27 DIAGNOSIS — C3490 Malignant neoplasm of unspecified part of unspecified bronchus or lung: Secondary | ICD-10-CM | POA: Insufficient documentation

## 2014-08-27 DIAGNOSIS — I2510 Atherosclerotic heart disease of native coronary artery without angina pectoris: Secondary | ICD-10-CM | POA: Insufficient documentation

## 2014-08-27 DIAGNOSIS — C3411 Malignant neoplasm of upper lobe, right bronchus or lung: Secondary | ICD-10-CM

## 2014-08-27 HISTORY — DX: Constipation, unspecified: K59.00

## 2014-08-27 HISTORY — PX: SCALENE NODE BIOPSY: SHX5446

## 2014-08-27 HISTORY — DX: Dependence on supplemental oxygen: Z99.81

## 2014-08-27 HISTORY — PX: VIDEO BRONCHOSCOPY WITH ENDOBRONCHIAL ULTRASOUND: SHX6177

## 2014-08-27 LAB — COMPREHENSIVE METABOLIC PANEL
ALT: 17 U/L (ref 0–53)
AST: 20 U/L (ref 0–37)
Albumin: 3.4 g/dL — ABNORMAL LOW (ref 3.5–5.2)
Alkaline Phosphatase: 70 U/L (ref 39–117)
Anion gap: 9 (ref 5–15)
BUN: 7 mg/dL (ref 6–23)
CO2: 25 mmol/L (ref 19–32)
Calcium: 9.5 mg/dL (ref 8.4–10.5)
Chloride: 100 mmol/L (ref 96–112)
Creatinine, Ser: 0.84 mg/dL (ref 0.50–1.35)
GFR calc Af Amer: >90 mL/min (ref >90)
GFR calc non Af Amer: 86 mL/min — ABNORMAL LOW (ref >90)
Glucose, Bld: 96 mg/dL (ref 70–99)
Potassium: 4.1 mmol/L (ref 3.5–5.1)
Sodium: 134 mmol/L — ABNORMAL LOW (ref 135–145)
Total Bilirubin: 0.6 mg/dL (ref 0.3–1.2)
Total Protein: 6.4 g/dL (ref 6.0–8.3)

## 2014-08-27 LAB — CBC
HCT: 39.6 % (ref 39.0–52.0)
Hemoglobin: 13.4 g/dL (ref 13.0–17.0)
MCH: 31.2 pg (ref 26.0–34.0)
MCHC: 33.8 g/dL (ref 30.0–36.0)
MCV: 92.3 fL (ref 78.0–100.0)
Platelets: 289 10*3/uL (ref 150–400)
RBC: 4.29 MIL/uL (ref 4.22–5.81)
RDW: 13.4 % (ref 11.5–15.5)
WBC: 7.6 10*3/uL (ref 4.0–10.5)

## 2014-08-27 LAB — APTT: aPTT: 30 seconds (ref 24–37)

## 2014-08-27 LAB — PROTIME-INR
INR: 0.99 (ref 0.00–1.49)
Prothrombin Time: 13.2 seconds (ref 11.6–15.2)

## 2014-08-27 SURGERY — BRONCHOSCOPY, WITH EBUS
Anesthesia: General | Site: Chest | Laterality: Right

## 2014-08-27 MED ORDER — LACTATED RINGERS IV SOLN
INTRAVENOUS | Status: DC
  Administered 2014-08-27: 11:00:00 via INTRAVENOUS

## 2014-08-27 MED ORDER — PHENYLEPHRINE HCL 10 MG/ML IJ SOLN
INTRAMUSCULAR | Status: DC | PRN
  Administered 2014-08-27: 80 ug via INTRAVENOUS
  Administered 2014-08-27: 120 ug via INTRAVENOUS

## 2014-08-27 MED ORDER — ROCURONIUM BROMIDE 50 MG/5ML IV SOLN
INTRAVENOUS | Status: AC
  Filled 2014-08-27: qty 1

## 2014-08-27 MED ORDER — ONDANSETRON HCL 4 MG/2ML IJ SOLN
INTRAMUSCULAR | Status: AC
  Filled 2014-08-27: qty 2

## 2014-08-27 MED ORDER — OXYCODONE HCL 5 MG PO TABS: 5.0000 mg | ORAL_TABLET | ORAL | Status: DC | PRN

## 2014-08-27 MED ORDER — DEXAMETHASONE SODIUM PHOSPHATE 4 MG/ML IJ SOLN
INTRAMUSCULAR | Status: DC | PRN
  Administered 2014-08-27: 8 mg via INTRAVENOUS

## 2014-08-27 MED ORDER — FENTANYL CITRATE 0.05 MG/ML IJ SOLN: 25.0000 ug | INTRAMUSCULAR | Status: DC | PRN

## 2014-08-27 MED ORDER — SODIUM CHLORIDE 0.9 % IJ SOLN: 3.0000 mL | INTRAMUSCULAR | Status: DC | PRN

## 2014-08-27 MED ORDER — DEXTROSE 5 % IV SOLN
1.5000 g | INTRAVENOUS | Status: AC
  Administered 2014-08-27: 1.5 g via INTRAVENOUS
  Filled 2014-08-27: qty 1.5

## 2014-08-27 MED ORDER — ONDANSETRON HCL 4 MG/2ML IJ SOLN
INTRAMUSCULAR | Status: DC | PRN
  Administered 2014-08-27: 4 mg via INTRAVENOUS

## 2014-08-27 MED ORDER — ACETAMINOPHEN 650 MG RE SUPP
650.0000 mg | RECTAL | Status: DC | PRN
  Filled 2014-08-27: qty 1

## 2014-08-27 MED ORDER — 0.9 % SODIUM CHLORIDE (POUR BTL) OPTIME
TOPICAL | Status: DC | PRN
  Administered 2014-08-27: 1000 mL

## 2014-08-27 MED ORDER — EPINEPHRINE HCL 1 MG/ML IJ SOLN
INTRAMUSCULAR | Status: AC
  Filled 2014-08-27: qty 1

## 2014-08-27 MED ORDER — GLYCOPYRROLATE 0.2 MG/ML IJ SOLN
INTRAMUSCULAR | Status: AC
  Filled 2014-08-27: qty 3

## 2014-08-27 MED ORDER — LACTATED RINGERS IV SOLN
INTRAVENOUS | Status: DC | PRN
  Administered 2014-08-27: 12:00:00 via INTRAVENOUS

## 2014-08-27 MED ORDER — ROCURONIUM BROMIDE 100 MG/10ML IV SOLN
INTRAVENOUS | Status: DC | PRN
  Administered 2014-08-27: 40 mg via INTRAVENOUS
  Administered 2014-08-27: 10 mg via INTRAVENOUS
  Administered 2014-08-27: 5 mg via INTRAVENOUS

## 2014-08-27 MED ORDER — SODIUM CHLORIDE 0.9 % IV SOLN: 250.0000 mL | INTRAVENOUS | Status: DC | PRN

## 2014-08-27 MED ORDER — FENTANYL CITRATE 0.05 MG/ML IJ SOLN
INTRAMUSCULAR | Status: DC | PRN
  Administered 2014-08-27: 100 ug via INTRAVENOUS

## 2014-08-27 MED ORDER — DEXAMETHASONE SODIUM PHOSPHATE 4 MG/ML IJ SOLN
INTRAMUSCULAR | Status: AC
  Filled 2014-08-27: qty 2

## 2014-08-27 MED ORDER — SODIUM CHLORIDE 0.9 % IJ SOLN
3.0000 mL | Freq: Two times a day (BID) | INTRAMUSCULAR | Status: DC
Start: 2014-08-27 — End: 2014-08-27

## 2014-08-27 MED ORDER — PHENYLEPHRINE 40 MCG/ML (10ML) SYRINGE FOR IV PUSH (FOR BLOOD PRESSURE SUPPORT)
PREFILLED_SYRINGE | INTRAVENOUS | Status: AC
  Filled 2014-08-27: qty 10

## 2014-08-27 MED ORDER — LIDOCAINE-EPINEPHRINE (PF) 1 %-1:200000 IJ SOLN
INTRAMUSCULAR | Status: AC
  Filled 2014-08-27: qty 10

## 2014-08-27 MED ORDER — LIDOCAINE HCL (CARDIAC) 20 MG/ML IV SOLN
INTRAVENOUS | Status: DC | PRN
  Administered 2014-08-27: 60 mg via INTRAVENOUS

## 2014-08-27 MED ORDER — PROPOFOL 10 MG/ML IV BOLUS
INTRAVENOUS | Status: DC | PRN
  Administered 2014-08-27: 110 mg via INTRAVENOUS

## 2014-08-27 MED ORDER — NEOSTIGMINE METHYLSULFATE 10 MG/10ML IV SOLN
INTRAVENOUS | Status: DC | PRN
  Administered 2014-08-27: 4 mg via INTRAVENOUS

## 2014-08-27 MED ORDER — ARTIFICIAL TEARS OP OINT
TOPICAL_OINTMENT | OPHTHALMIC | Status: DC | PRN
  Administered 2014-08-27: 1 via OPHTHALMIC

## 2014-08-27 MED ORDER — PHENYLEPHRINE HCL 10 MG/ML IJ SOLN
10.0000 mg | INTRAMUSCULAR | Status: DC | PRN
  Administered 2014-08-27: 40 ug/min via INTRAVENOUS

## 2014-08-27 MED ORDER — FENTANYL CITRATE 0.05 MG/ML IJ SOLN
25.0000 ug | INTRAMUSCULAR | Status: DC | PRN
  Administered 2014-08-27: 25 ug via INTRAVENOUS

## 2014-08-27 MED ORDER — FENTANYL CITRATE 0.05 MG/ML IJ SOLN
INTRAMUSCULAR | Status: AC
  Filled 2014-08-27: qty 5

## 2014-08-27 MED ORDER — ACETAMINOPHEN 325 MG PO TABS
650.0000 mg | ORAL_TABLET | ORAL | Status: DC | PRN
  Filled 2014-08-27: qty 2

## 2014-08-27 MED ORDER — LIDOCAINE HCL (CARDIAC) 20 MG/ML IV SOLN
INTRAVENOUS | Status: AC
  Filled 2014-08-27: qty 5

## 2014-08-27 MED ORDER — ARTIFICIAL TEARS OP OINT
TOPICAL_OINTMENT | OPHTHALMIC | Status: AC
  Filled 2014-08-27: qty 3.5

## 2014-08-27 MED ORDER — FENTANYL CITRATE 0.05 MG/ML IJ SOLN
INTRAMUSCULAR | Status: AC
  Filled 2014-08-27: qty 2

## 2014-08-27 MED ORDER — GLYCOPYRROLATE 0.2 MG/ML IJ SOLN
INTRAMUSCULAR | Status: DC | PRN
  Administered 2014-08-27: 0.6 mg via INTRAVENOUS

## 2014-08-27 SURGICAL SUPPLY — 58 items
BRUSH CYTOL CELLEBRITY 1.5X140 (MISCELLANEOUS) ×4 IMPLANT
CANISTER SUCTION 2500CC (MISCELLANEOUS) ×4 IMPLANT
CLIP TI MEDIUM 24 (CLIP) ×4 IMPLANT
CLIP TI WIDE RED SMALL 24 (CLIP) ×4 IMPLANT
CONT SPEC 4OZ CLIKSEAL STRL BL (MISCELLANEOUS) ×8 IMPLANT
COTTONBALL LRG STERILE PKG (GAUZE/BANDAGES/DRESSINGS) IMPLANT
COVER SURGICAL LIGHT HANDLE (MISCELLANEOUS) ×4 IMPLANT
COVER TABLE BACK 60X90 (DRAPES) ×4 IMPLANT
DERMABOND ADHESIVE PROPEN (GAUZE/BANDAGES/DRESSINGS) ×2
DERMABOND ADVANCED (GAUZE/BANDAGES/DRESSINGS) ×2
DERMABOND ADVANCED .7 DNX12 (GAUZE/BANDAGES/DRESSINGS) ×2 IMPLANT
DERMABOND ADVANCED .7 DNX6 (GAUZE/BANDAGES/DRESSINGS) ×2 IMPLANT
DRAPE LAPAROTOMY T 102X78X121 (DRAPES) IMPLANT
DRSG AQUACEL AG ADV 3.5X14 (GAUZE/BANDAGES/DRESSINGS) IMPLANT
ELECT CAUTERY BLADE 6.4 (BLADE) ×4 IMPLANT
ELECT REM PT RETURN 9FT ADLT (ELECTROSURGICAL) ×4
ELECTRODE REM PT RTRN 9FT ADLT (ELECTROSURGICAL) ×2 IMPLANT
FORCEPS BIOP RJ4 1.8 (CUTTING FORCEPS) IMPLANT
GAUZE SPONGE 4X4 12PLY STRL (GAUZE/BANDAGES/DRESSINGS) ×4 IMPLANT
GLOVE BIO SURGEON STRL SZ7.5 (GLOVE) ×12 IMPLANT
GOWN STRL REUS W/ TWL LRG LVL3 (GOWN DISPOSABLE) ×6 IMPLANT
GOWN STRL REUS W/TWL LRG LVL3 (GOWN DISPOSABLE) ×6
KIT BASIN OR (CUSTOM PROCEDURE TRAY) ×4 IMPLANT
KIT CLEAN ENDO COMPLIANCE (KITS) ×4 IMPLANT
KIT ROOM TURNOVER OR (KITS) ×4 IMPLANT
MARKER SKIN DUAL TIP RULER LAB (MISCELLANEOUS) ×4 IMPLANT
NEEDLE 22X1 1/2 (OR ONLY) (NEEDLE) IMPLANT
NEEDLE BIOPSY TRANSBRONCH 21G (NEEDLE) IMPLANT
NEEDLE BLUNT 18X1 FOR OR ONLY (NEEDLE) IMPLANT
NEEDLE SONO TIP II EBUS (NEEDLE) ×8 IMPLANT
NEEDLE SYS SONOTIP II EBUSTBNA (NEEDLE) ×4 IMPLANT
NS IRRIG 1000ML POUR BTL (IV SOLUTION) ×8 IMPLANT
OIL SILICONE PENTAX (PARTS (SERVICE/REPAIRS)) ×4 IMPLANT
PACK GENERAL/GYN (CUSTOM PROCEDURE TRAY) ×4 IMPLANT
PAD ARMBOARD 7.5X6 YLW CONV (MISCELLANEOUS) ×8 IMPLANT
SPONGE GAUZE 4X4 12PLY STER LF (GAUZE/BANDAGES/DRESSINGS) ×4 IMPLANT
SPONGE INTESTINAL PEANUT (DISPOSABLE) ×4 IMPLANT
SUT ETHILON 3 0 FSL (SUTURE) ×4 IMPLANT
SUT SILK 2 0 TIES 10X30 (SUTURE) IMPLANT
SUT SILK 3 0 (SUTURE) ×2
SUT SILK 3-0 18XBRD TIE 12 (SUTURE) ×2 IMPLANT
SUT VIC AB 2-0 CT1 27 (SUTURE)
SUT VIC AB 2-0 CT1 TAPERPNT 27 (SUTURE) IMPLANT
SUT VIC AB 3-0 SH 18 (SUTURE) ×8 IMPLANT
SUT VIC AB 3-0 SH 27 (SUTURE)
SUT VIC AB 3-0 SH 27X BRD (SUTURE) IMPLANT
SUT VIC AB 3-0 X1 27 (SUTURE) IMPLANT
SYR 20CC LL (SYRINGE) IMPLANT
SYR 20ML ECCENTRIC (SYRINGE) ×8 IMPLANT
SYR 5ML LUER SLIP (SYRINGE) IMPLANT
SYR CONTROL 10ML LL (SYRINGE) IMPLANT
TAPE CLOTH SURG 4X10 WHT LF (GAUZE/BANDAGES/DRESSINGS) ×4 IMPLANT
TOWEL OR 17X24 6PK STRL BLUE (TOWEL DISPOSABLE) ×8 IMPLANT
TOWEL OR 17X26 10 PK STRL BLUE (TOWEL DISPOSABLE) ×4 IMPLANT
TRAP SPECIMEN MUCOUS 40CC (MISCELLANEOUS) ×4 IMPLANT
TUBE CONNECTING 20'X1/4 (TUBING) ×1
TUBE CONNECTING 20X1/4 (TUBING) ×3 IMPLANT
WATER STERILE IRR 1000ML POUR (IV SOLUTION) ×4 IMPLANT

## 2014-08-27 NOTE — Telephone Encounter (Signed)
Called and spoke to pt's wife. Informed spouse of the recs per [employer] [facility] [employer]. Pt's wife verbalized understanding and denied any further questions or concerns at this time. Pt's wife stated she would relay the message to pt.

## 2014-08-27 NOTE — Telephone Encounter (Signed)
Noted. Let pt know that he did not qualify for oxygen at his visit with me.  If he qualified during his visit with Julien Nordmann, then medicare will pay for it. I am happy to continue seeing him, but if they wish to see another physician, we can send the records to a doctor of their choosing.

## 2014-08-27 NOTE — Anesthesia Preprocedure Evaluation (Addendum)
Anesthesia Evaluation  Patient identified by MRN, date of birth, ID band Patient awake    Reviewed: Allergy & Precautions, NPO status , Patient's Chart, lab work & pertinent test results  Airway Mallampati: II  TM Distance: >3 FB Neck ROM: Full    Dental  (+) Edentulous Upper, Edentulous Lower   Pulmonary shortness of breath and at rest, COPDformer smoker,  H/o right upper lobectomy now with ? Recurrence and worsened breathing, q4-6 nebs at home has helped   + wheezing      Cardiovascular hypertension, Pt. on medications - angina+ CAD and + Peripheral Vascular Disease - Past MI and - CHF - dysrhythmias Rhythm:Regular     Neuro/Psych PSYCHIATRIC DISORDERS Anxiety negative neurological ROS     GI/Hepatic Neg liver ROS, hiatal hernia, GERD-  Medicated and Controlled,  Endo/Other  negative endocrine ROS  Renal/GU negative Renal ROS     Musculoskeletal  (+) Arthritis -,   Abdominal   Peds  Hematology   Anesthesia Other Findings   Reproductive/Obstetrics                            Anesthesia Physical Anesthesia Plan  ASA: III  Anesthesia Plan: General   Post-op Pain Management:    Induction: Intravenous  Airway Management Planned: Oral ETT  Additional Equipment: None  Intra-op Plan:   Post-operative Plan: Extubation in OR and Possible Post-op intubation/ventilation  Informed Consent: I have reviewed the patients History and Physical, chart, labs and discussed the procedure including the risks, benefits and alternatives for the proposed anesthesia with the patient or authorized representative who has indicated his/her understanding and acceptance.   Dental advisory given  Plan Discussed with: Surgeon and CRNA  Anesthesia Plan Comments:         Anesthesia Quick Evaluation

## 2014-08-27 NOTE — Discharge Instructions (Signed)
°What to eat: ° °For your first meals, you should eat lightly; only small meals initially.  If you do not have nausea, you may eat larger meals.  Avoid spicy, greasy and heavy food.   ° °General Anesthesia, Adult, Care After  °Refer to this sheet in the next few weeks. These instructions provide you with information on caring for yourself after your procedure. Your health care provider may also give you more specific instructions. Your treatment has been planned according to current medical practices, but problems sometimes occur. Call your health care provider if you have any problems or questions after your procedure.  °WHAT TO EXPECT AFTER THE PROCEDURE  °After the procedure, it is typical to experience:  °Sleepiness.  °Nausea and vomiting. °HOME CARE INSTRUCTIONS  °For the first 24 hours after general anesthesia:  °Have a responsible person with you.  °Do not drive a car. If you are alone, do not take public transportation.  °Do not drink alcohol.  °Do not take medicine that has not been prescribed by your health care provider.  °Do not sign important papers or make important decisions.  °You may resume a normal diet and activities as directed by your health care provider.  °Change bandages (dressings) as directed.  °If you have questions or problems that seem related to general anesthesia, call the hospital and ask for the anesthetist or anesthesiologist on call. °SEEK MEDICAL CARE IF:  °You have nausea and vomiting that continue the day after anesthesia.  °You develop a rash. °SEEK IMMEDIATE MEDICAL CARE IF:  °You have difficulty breathing.  °You have chest pain.  °You have any allergic problems. °Document Released: 10/04/2000 Document Revised: 02/28/2013 Document Reviewed: 01/11/2013  °ExitCare® Patient Information ©2014 [employer].  ° °Sore Throat  ° ° °A sore throat is a painful, burning, sore, or scratchy feeling of the throat. There may be pain or tenderness when swallowing or talking. You may have  other symptoms with a sore throat. These include coughing, sneezing, fever, or a swollen neck. A sore throat is often the first sign of another sickness. These sicknesses may include a cold, flu, strep throat, or an infection called mono. Most sore throats go away without medical treatment.  °HOME CARE  °Only take medicine as told by your doctor.  °Drink enough fluids to keep your pee (urine) clear or pale yellow.  °Rest as needed.  °Try using throat sprays, lozenges, or suck on hard candy (if older than 4 years or as told).  °Sip warm liquids, such as broth, herbal tea, or warm water with honey. Try sucking on frozen ice pops or drinking cold liquids.  °Rinse the mouth (gargle) with salt water. Mix 1 teaspoon salt with 8 ounces of water.  °Do not smoke. Avoid being around others when they are smoking.  °Put a humidifier in your bedroom at night to moisten the air. You can also turn on a hot shower and sit in the bathroom for 5-10 minutes. Be sure the bathroom door is closed. °GET HELP RIGHT AWAY IF:  °You have trouble breathing.  °You cannot swallow fluids, soft foods, or your spit (saliva).  °You have more puffiness (swelling) in the throat.  °Your sore throat does not get better in 7 days.  °You feel sick to your stomach (nauseous) and throw up (vomit).  °You have a fever or lasting symptoms for more than 2-3 days.  °You have a fever and your symptoms suddenly get worse. °MAKE SURE YOU:  °Understand these   instructions.  °Will watch your condition.  °Will get help right away if you are not doing well or get worse. °Document Released: 04/06/2008 Document Revised: 03/22/2012 Document Reviewed: 03/05/2012  °ExitCare® Patient Information ©2015 [employer]. This information is not intended to replace advice given to you by your health care provider. Make sure you discuss any questions you have with your health care provider.  ° ° ° °

## 2014-08-27 NOTE — Progress Notes (Signed)
The patient was examined and preop studies reviewed. There has been no change from the prior exam and the patient is ready for surgery.   Plan EBUS and Biopsy R scalene node on R [name]

## 2014-08-27 NOTE — Brief Op Note (Signed)
08/27/2014  1:38 PM  PATIENT:  [name]  74 y.o. male  PRE-OPERATIVE DIAGNOSIS:  MEDIASTINAL ADENOPATHY  POST-OPERATIVE DIAGNOSIS:  MEDIASTINAL ADENOPATHY  PROCEDURE:  Procedure(s): VIDEO BRONCHOSCOPY WITH ENDOBRONCHIAL ULTRASOUND (N/A) BIOPSY SCALENE NODE (Right)  SURGEON:  Surgeon(s) and Role:    * Ivin Poot, MD - Primary  PHYSICIAN ASSISTANT:   ASSISTANTS: none   ANESTHESIA:   general  EBL:     BLOOD ADMINISTERED:none  DRAINS: none   LOCAL MEDICATIONS USED:  NONE  SPECIMEN:  Aspirate and Excision of mediastinal nodes and R scalene node  DISPOSITION OF SPECIMEN:  PATHOLOGY  COUNTS:  YES  TOURNIQUET:  * No tourniquets in log *  DICTATION: .Dragon Dictation  PLAN OF CARE: Discharge to home after PACU  PATIENT DISPOSITION:  PACU - hemodynamically stable.   Delay start of Pharmacological VTE agent (>24hrs) due to surgical blood loss or risk of bleeding: yes

## 2014-08-27 NOTE — Transfer of Care (Signed)
Immediate Anesthesia Transfer of Care Note  Patient: [name]  Procedure(s) Performed: Procedure(s): VIDEO BRONCHOSCOPY WITH ENDOBRONCHIAL ULTRASOUND (N/A) BIOPSY SCALENE NODE (Right)  Patient Location: PACU  Anesthesia Type:General  Level of Consciousness: awake, alert , oriented and patient cooperative  Airway & Oxygen Therapy: Patient Spontanous Breathing and Patient connected to face mask oxygen  Post-op Assessment: Report given to RN, Post -op Vital signs reviewed and stable and Patient moving all extremities  Post vital signs: Reviewed and stable  Last Vitals:  Filed Vitals:   08/27/14 1028  BP: 136/64  Pulse: 101  Temp: 36.5 C  Resp: 18    Complications: No apparent anesthesia complications

## 2014-08-28 NOTE — Op Note (Signed)
NAMEMarland Kitchen  [name], [name] NO.:  [id number]  MEDICAL RECORD NO.:  [id number]  LOCATION:                                 FACILITY:  PHYSICIAN:  Ivin Poot, M.D.  DATE OF BIRTH:  27-Mar-1941  DATE OF PROCEDURE:  08/27/2014 DATE OF DISCHARGE:  08/27/2014                              OPERATIVE REPORT   OPERATION: 1. Video bronchoscopy. 2. Endobronchial ultrasound directed transbronchial biopsies of 4R and     subcarinal station #7 lymph nodes. 3. Right scalene lymph node biopsy.  SURGEON:  Ivin Poot, M.D.  ANESTHESIA:  General by Dr. Annye Asa.  PREOPERATIVE DIAGNOSES:  History of previous right upper lobectomy for stage II a non-small cell carcinoma with evidence of potential recurrence on scans.  Tissue diagnosis was requested by the patient's oncologist to help direct therapy.  POSTOPERATIVE DIAGNOSES:  History of previous right upper lobectomy for stage II a non-small cell carcinoma with evidence of potential recurrence on scans. Tissue diagnosis was requested by the patient's oncologist to help direct therapy.  CLINICAL NOTE:  The patient was seen in the office and the results of his recent PET/CT scan reviewed.  There is evidence of recurrent or new bronchogenic carcinoma involving the right lung, mediastinal nodes, and possible right scalene node.  These were all hypermetabolic as well as new pulmonary nodules throughout the right lung.  I discussed the procedure of bronchoscopy and EBUS with the patient and also the possibility of right scalene node biopsy.  He understood the indications, benefits, and the associated risks.  He understood that we are obtaining tissue to help his oncologist drug therapy for probable advanced stage recurrent lung cancer.  He demonstrated his understanding and agreed to proceed with surgery under what I felt was an informed consent.  OPERATIVE PROCEDURE:  The patient was brought to the operating room  and placed supine on the operating table.  General anesthesia was induced. A proper time-out was performed.  Through the endotracheal tube, the video bronchoscope was passed.  The distal trachea and carina were inspected and were normal.  The left mainstem and endobronchial segments of the left upper lobe and left lower lobe were examined and found to be negative without endobronchial lesions.  The bronchoscope was passed on the right mainstem bronchus.  This had no endobronchial lesions.  The right upper lobe bronchus was stapled and had a secure closure without evidence recurrence.  The right middle lobe bronchus appeared normal.  The right lower lobe bronchus appeared to be narrowed with some bloody secretions.  Washings and brushings were taken of the right lower lobe and submitted for cytology.  Next, the EBUS scope was passed and enlarged lymph nodes in the 4R and subcarinal station #7 were visualized and biopsied and aspirated for cytology under ultrasound guidance.  Quick prep on these returned negative for malignancy.  The washings of the right lower lobe quick prep.  Returned positive for malignancy but unknown cell type.  It was evident more tissue would be needed to help direct chemotherapy for the patient's advanced recurrent cancer.  We proceeded with the right scalene node biopsy.  The right neck was prepped  and draped as a sterile field.  A proper time-out was performed. Incision was made along the posterior to the sternocleidomastoid and carried down into the neck tissue.  A firm lymph node was identified, was dissected out and submitted in its entirety for permanent section. The field was irrigated and hemostasis was achieved.  The deep tissues were closed with interrupted 3-0 Vicryl.  The platysma was closed with interrupted 3-0 Vicryl and the skin was closed with interrupted 3-0 nylon skin sutures.  Sterile dressing was applied.  The patient was then extubated  and returned to the recovery room in stable condition.     Ivin Poot, M.D.     PV/MEDQ  D:  08/27/2014  T:  08/28/2014  Job:  [id number]  cc:   Eilleen Kempf, M.D.

## 2014-08-28 NOTE — Anesthesia Postprocedure Evaluation (Signed)
  Anesthesia Post-op Note  Patient: [name]  Procedure(s) Performed: Procedure(s): VIDEO BRONCHOSCOPY WITH ENDOBRONCHIAL ULTRASOUND (N/A) BIOPSY SCALENE NODE (Right)  Patient Location: PACU  Anesthesia Type:General  Level of Consciousness: awake  Airway and Oxygen Therapy: Patient Spontanous Breathing  Post-op Pain: mild  Post-op Assessment: Post-op Vital signs reviewed, Patient's Cardiovascular Status Stable, Respiratory Function Stable, Patent Airway, No signs of Nausea or vomiting and Pain level controlled  Post-op Vital Signs: Reviewed and stable  Last Vitals:  Filed Vitals:   08/27/14 1545  BP: 139/63  Pulse: 84  Temp: 36.7 C  Resp: 18    Complications: No apparent anesthesia complications

## 2014-08-29 ENCOUNTER — Encounter: Payer: Self-pay | Admitting: *Deleted

## 2014-08-29 ENCOUNTER — Encounter (HOSPITAL_COMMUNITY): Payer: Self-pay | Admitting: Cardiothoracic Surgery

## 2014-08-29 NOTE — [facility] Oncology Navigator Note (Unsigned)
Dr. Darcey Nora called today.  He updated me on patient having recurrence of NSCLC squamous cell.  I updated [name].  Patient is scheduled to see Dr. Worthy Flank PA on 09/05/14 for follow up

## 2014-09-02 ENCOUNTER — Encounter: Payer: Self-pay | Admitting: *Deleted

## 2014-09-02 NOTE — Progress Notes (Signed)
09/02/14 @08 :50:  Mrs. Loberg called for her husband to ask about having his appointment of 09/06/14 rescheduled to sooner.  She said that Dr. Nils Pyle had called them with the biopsy results and they are very anxious about what is next.  Spoke with [name] and there is no sooner appointment available.  He asked that I let the patient know he will need chemotherapy and that will be discussed with him in detail on Thursday, 09/06/14 in detail.  Called his wife and gave her that information.  She was in agreement with that plan.

## 2014-09-05 ENCOUNTER — Telehealth: Payer: Self-pay | Admitting: Internal Medicine

## 2014-09-05 ENCOUNTER — Other Ambulatory Visit: Payer: Medicare Other

## 2014-09-05 ENCOUNTER — Ambulatory Visit: Payer: Medicare Other | Admitting: Physician Assistant

## 2014-09-05 ENCOUNTER — Other Ambulatory Visit: Payer: Self-pay | Admitting: Physician Assistant

## 2014-09-05 NOTE — Telephone Encounter (Signed)
per AJ moved 2/25 appt form her to 2/29 w/MM @ 3:30pm. lb-f/u moved accordingly. s/w wife re new d/t. both pt and wife are upset re the move. per 2/4 pof pt was to be scheduled for fu 2/16. MM was full pt was scheduled w/AJ. on 2/10 pof was sent to r/s appt for 2/16 to the following week. per AJ appt should have been with MM. neither pof specified MM. wife has new d/t for 2/29 @ 3pm for lb/MM.

## 2014-09-06 ENCOUNTER — Ambulatory Visit: Payer: Self-pay | Admitting: *Deleted

## 2014-09-06 ENCOUNTER — Telehealth: Payer: Self-pay | Admitting: Medical Oncology

## 2014-09-06 DIAGNOSIS — Z4802 Encounter for removal of sutures: Secondary | ICD-10-CM

## 2014-09-06 DIAGNOSIS — Z9889 Other specified postprocedural states: Secondary | ICD-10-CM

## 2014-09-06 DIAGNOSIS — R918 Other nonspecific abnormal finding of lung field: Secondary | ICD-10-CM

## 2014-09-06 NOTE — Progress Notes (Signed)
[name] returns for suture removal of the left scalene node biopsy incision. Four sutures were easily removed. He has an appointment to see [name] on Monday to discuss treatment. He will return here prn.

## 2014-09-06 NOTE — Telephone Encounter (Signed)
Request medical necessity for home oxygen-faxed ov note.

## 2014-09-09 ENCOUNTER — Telehealth: Payer: Self-pay | Admitting: Internal Medicine

## 2014-09-09 ENCOUNTER — Encounter: Payer: Self-pay | Admitting: Internal Medicine

## 2014-09-09 ENCOUNTER — Other Ambulatory Visit (HOSPITAL_BASED_OUTPATIENT_CLINIC_OR_DEPARTMENT_OTHER): Payer: Medicare Other

## 2014-09-09 ENCOUNTER — Ambulatory Visit (HOSPITAL_BASED_OUTPATIENT_CLINIC_OR_DEPARTMENT_OTHER): Payer: Medicare Other | Admitting: Internal Medicine

## 2014-09-09 VITALS — BP 173/81 | HR 91 | Temp 98.0°F | Resp 19 | Ht 69.0 in | Wt 173.6 lb

## 2014-09-09 DIAGNOSIS — C7971 Secondary malignant neoplasm of right adrenal gland: Secondary | ICD-10-CM | POA: Diagnosis not present

## 2014-09-09 DIAGNOSIS — C3411 Malignant neoplasm of upper lobe, right bronchus or lung: Secondary | ICD-10-CM

## 2014-09-09 DIAGNOSIS — G939 Disorder of brain, unspecified: Secondary | ICD-10-CM

## 2014-09-09 DIAGNOSIS — C3491 Malignant neoplasm of unspecified part of right bronchus or lung: Secondary | ICD-10-CM

## 2014-09-09 LAB — CBC WITH DIFFERENTIAL/PLATELET
BASO%: 0.5 % (ref 0.0–2.0)
Basophils Absolute: 0 10*3/uL (ref 0.0–0.1)
EOS ABS: 0.1 10*3/uL (ref 0.0–0.5)
EOS%: 1.2 % (ref 0.0–7.0)
HEMATOCRIT: 41.3 % (ref 38.4–49.9)
HGB: 13.9 g/dL (ref 13.0–17.1)
LYMPH%: 21.9 % (ref 14.0–49.0)
MCH: 31.4 pg (ref 27.2–33.4)
MCHC: 33.7 g/dL (ref 32.0–36.0)
MCV: 93.2 fL (ref 79.3–98.0)
MONO#: 0.8 10*3/uL (ref 0.1–0.9)
MONO%: 9 % (ref 0.0–14.0)
NEUT%: 67.4 % (ref 39.0–75.0)
NEUTROS ABS: 5.8 10*3/uL (ref 1.5–6.5)
Platelets: 305 10*3/uL (ref 140–400)
RBC: 4.43 10*6/uL (ref 4.20–5.82)
RDW: 13.1 % (ref 11.0–14.6)
WBC: 8.6 10*3/uL (ref 4.0–10.3)
lymph#: 1.9 10*3/uL (ref 0.9–3.3)

## 2014-09-09 LAB — COMPREHENSIVE METABOLIC PANEL (CC13)
ALK PHOS: 83 U/L (ref 40–150)
ALT: 13 U/L (ref 0–55)
ANION GAP: 7 meq/L (ref 3–11)
AST: 16 U/L (ref 5–34)
Albumin: 3.3 g/dL — ABNORMAL LOW (ref 3.5–5.0)
BILIRUBIN TOTAL: 0.31 mg/dL (ref 0.20–1.20)
BUN: 8 mg/dL (ref 7.0–26.0)
CO2: 27 meq/L (ref 22–29)
Calcium: 9.4 mg/dL (ref 8.4–10.4)
Chloride: 100 mEq/L (ref 98–109)
Creatinine: 0.7 mg/dL (ref 0.7–1.3)
EGFR: >90 mL/min/{1.73_m2} (ref >90)
Glucose: 89 mg/dl (ref 70–140)
Potassium: 4 mEq/L (ref 3.5–5.1)
Sodium: 134 mEq/L — ABNORMAL LOW (ref 136–145)
Total Protein: 6.8 g/dL (ref 6.4–8.3)

## 2014-09-09 NOTE — Telephone Encounter (Signed)
gv adn printed appt sched and avs for pt for March...sed added tx.

## 2014-09-09 NOTE — Progress Notes (Signed)
[facility] [phone]   Fax:([phone]) [phone]  OFFICE PROGRESS NOTE  [name], MD [place] [address]  DIAGNOSIS: Metastatic non-small cell lung cancer, squamous cell carcinoma initially diagnosed as Stage IIA (T2b., N0, M0) non-small cell lung cancer, squamous cell carcinoma diagnosed in December of 2013   PRIOR THERAPY:  1) Status post right upper lobectomy under the care of Dr. Prescott Gum on 09/14/2012.  2) Adjuvant chemotherapy with cisplatin 75 mg/M2 and docetaxel 75 mg/M2 with Neulasta support every 3 weeks, status post 1 cycle. Starting with cycle #2, patient will receive carboplatin for an AUC initially of 4.5 and paclitaxel at 175 mg per meter squared with Neulasta support given every 3 weeks. Status post a total of 3 cycles.   CURRENT THERAPY: Systemic chemotherapy was carboplatin for AUC of 5 on day 1 and gemcitabine 1000 MG/M2 on days 1 and 8 every 3 weeks. First dose 09/11/2014.  CHEMOTHERAPY INTENT: Palliative. CURRENT # OF CHEMOTHERAPY CYCLES: 1 CURRENT ANTIEMETICS: Zofran, dexamethasone and Compazine  CURRENT SMOKING STATUS: Quit smoking recently.  ORAL CHEMOTHERAPY AND CONSENT: None  CURRENT BISPHOSPHONATES USE: None  PAIN MANAGEMENT: 3/10 controlled by Norco when necessary  NARCOTICS INDUCED CONSTIPATION: Milk of magnesia on as-needed basis.  LIVING WILL AND CODE STATUS: No CODE BLUE.   INTERVAL HISTORY:  [name] 74 y.o. male returns to the clinic today for followup visit accompanied his wife and daughter. The patient was found recently on repeat CT scan of the chest as well as PET scan to have evidence for disease recurrence. He was seen by Dr. Prescott Gum and repeat bronchoscopy with endobronchial ultrasound and biopsies as well as biopsy of the right scalene lymph node showed recurrent squamous cell carcinoma. He is here today for evaluation and discussion of his treatment options. He denied having any  significant chest pain but continues to have shortness of breath at baseline and increased with exertion with mild cough and no hemoptysis. He quit smoking recently. He denied having any significant nausea or vomiting, no fever or chills, no weight loss or night sweats.   MEDICAL HISTORY: Past Medical History  Diagnosis Date  . Arthritis   . Wheezing   . Sore throat   . Carotid artery occlusion   . COPD (chronic obstructive pulmonary disease)   . Lumbar disc disease   . Restless leg syndrome   . Allergic rhinitis   . Hypertension   . Anxiety     anxiety  . Shortness of breath   . Lung mass   . GERD (gastroesophageal reflux disease)   . H/O hiatal hernia   . Pre-operative cardiovascular examination   . Nonspecific abnormal unspecified cardiovascular function study   . Peripheral vascular disease, unspecified   . Cancer     lung mass  . Pneumonia 2014    ?   Marland Kitchen On home oxygen therapy     prn  . Constipation     ALLERGIES:  has No Known Allergies.  MEDICATIONS:  Current Outpatient Prescriptions  Medication Sig Dispense Refill  . albuterol (PROVENTIL) (5 MG/ML) 0.5% nebulizer solution Take 2.5 mg by nebulization every 6 (six) hours as needed for wheezing or shortness of breath.    Marland Kitchen aspirin EC 81 MG tablet Take 81 mg by mouth daily. Takes one tablet every evening.    . clonazePAM (KLONOPIN) 0.5 MG tablet Take 0.5 mg by mouth 2 (two) times daily as needed  for anxiety. Takes one tablet by mouth two times daily as needed for anxiety.    . diphenhydramine-acetaminophen (TYLENOL PM) 25-500 MG TABS Take 1 tablet by mouth at bedtime as needed.    . formoterol (FORADIL AEROLIZER) 12 MCG capsule for inhaler Place 1 capsule (12 mcg total) into inhaler and inhale 2 (two) times daily. 60 capsule 5  . HYDROcodone-acetaminophen (NORCO) 7.5-325 MG per tablet Take 1 tablet by mouth every 4 (four) hours as needed for pain. 40 tablet 0  . isosorbide mononitrate (IMDUR) 30 MG 24 hr tablet Take 1  tablet (30 mg total) by mouth daily. 90 tablet 3  . omeprazole (PRILOSEC) 40 MG capsule Take 40 mg by mouth daily.    . pramipexole (MIRAPEX) 1 MG tablet Take 1 mg by mouth at bedtime.    . promethazine-codeine (PHENERGAN WITH CODEINE) 6.25-10 MG/5ML syrup Take 5 mLs by mouth every 6 (six) hours as needed for cough. 120 mL 0  . Tiotropium Bromide Monohydrate (SPIRIVA RESPIMAT) 2.5 MCG/ACT AERS Inhale 2 puffs into the lungs daily. 1 Inhaler 5  . zolpidem (AMBIEN) 10 MG tablet Take 10 mg by mouth at bedtime as needed for sleep.    . nitroGLYCERIN (NITROSTAT) 0.4 MG SL tablet Place 1 tablet (0.4 mg total) under the tongue every 5 (five) minutes as needed for chest pain. (Patient not taking: Reported on 09/09/2014) 25 tablet 5  . UNABLE TO FIND 3 differeny eye drops- having cataract surgery 08/05/14.     No current facility-administered medications for this visit.    SURGICAL HISTORY:  Past Surgical History  Procedure Laterality Date  . Carotid endarterectomy  10/15/2010    left  . Hand surgery      repair of the left long, ring, and small finger after a saw injury  . Video bronchoscopy  07/24/2012    Procedure: VIDEO BRONCHOSCOPY WITH FLUORO;  Surgeon: Kathee Delton, MD;  Location: [facility];  Service: Cardiopulmonary;  Laterality: Bilateral;  . Lobectomy Right 09/14/2012    Procedure: LOBECTOMY;  Surgeon: Ivin Poot, MD;  Location: [facility];  Service: Thoracic;  Laterality: Right;  RIGHT UPPER LOBECTOMY  . Video assisted thoracoscopy Right 09/14/2012    Procedure: VIDEO ASSISTED THORACOSCOPY;  Surgeon: Ivin Poot, MD;  Location: [facility];  Service: Thoracic;  Laterality: Right;  . Video bronchoscopy with endobronchial ultrasound N/A 08/27/2014    Procedure: VIDEO BRONCHOSCOPY WITH ENDOBRONCHIAL ULTRASOUND;  Surgeon: Ivin Poot, MD;  Location: [facility];  Service: Thoracic;  Laterality: N/A;  . Scalene node biopsy Right 08/27/2014    Procedure: BIOPSY SCALENE NODE;  Surgeon: Ivin Poot,  MD;  Location: [facility];  Service: Thoracic;  Laterality: Right;    REVIEW OF SYSTEMS:  Constitutional: positive for fatigue Eyes: negative Ears, nose, mouth, throat, and face: negative Respiratory: positive for cough and dyspnea on exertion Cardiovascular: negative Gastrointestinal: negative Genitourinary:negative Integument/breast: negative Hematologic/lymphatic: negative Musculoskeletal:negative Neurological: negative Behavioral/Psych: negative Endocrine: negative Allergic/Immunologic: negative   PHYSICAL EXAMINATION: General appearance: alert, cooperative and no distress Head: Normocephalic, without obvious abnormality, atraumatic Neck: no adenopathy, no JVD, supple, symmetrical, trachea midline and thyroid not enlarged, symmetric, no tenderness/mass/nodules Lymph nodes: Cervical, supraclavicular, and axillary nodes normal. Resp: wheezes bilaterally Back: symmetric, no curvature. ROM normal. No CVA tenderness. Cardio: regular rate and rhythm, S1, S2 normal, no murmur, click, rub or gallop GI: soft, non-tender; bowel sounds normal; no masses,  no organomegaly Extremities: extremities normal, atraumatic, no cyanosis or edema Neurologic: Alert and oriented  X 3, normal strength and tone. Normal symmetric reflexes. Normal coordination and gait  ECOG PERFORMANCE STATUS: 1 - Symptomatic but completely ambulatory  Blood pressure 173/81, pulse 91, temperature 98 F (36.7 C), temperature source Oral, resp. rate 19, height 5\' 9"  (1.753 m), weight 173 lb 9.6 oz (78.744 kg), SpO2 93 %.  LABORATORY DATA: Lab Results  Component Value Date   WBC 8.6 09/09/2014   HGB 13.9 09/09/2014   HCT 41.3 09/09/2014   MCV 93.2 09/09/2014   PLT 305 09/09/2014      Chemistry      Component Value Date/Time   NA 134* 08/27/2014 1115   NA 135* 08/15/2014 1036   K 4.1 08/27/2014 1115   K 4.6 08/15/2014 1036   CL 100 08/27/2014 1115   CL 101 12/28/2012 0852   CO2 25 08/27/2014 1115   CO2 30*  08/15/2014 1036   BUN 7 08/27/2014 1115   BUN 5.6* 08/15/2014 1036   CREATININE 0.84 08/27/2014 1115   CREATININE 0.8 08/15/2014 1036      Component Value Date/Time   CALCIUM 9.5 08/27/2014 1115   CALCIUM 9.3 08/15/2014 1036   ALKPHOS 70 08/27/2014 1115   ALKPHOS 92 08/15/2014 1036   AST 20 08/27/2014 1115   AST 17 08/15/2014 1036   ALT 17 08/27/2014 1115   ALT 18 08/15/2014 1036   BILITOT 0.6 08/27/2014 1115   BILITOT 0.30 08/15/2014 1036       RADIOGRAPHIC STUDIES: Dg Chest 2 View  08/27/2014   CLINICAL DATA:  74 year old male status post bronchoscopy with endobronchial ultrasound. Mediastinal lymphadenopathy and COPD. Initial encounter.  EXAM: CHEST  2 VIEW  COMPARISON:  PET-CT 08/10/2014 and earlier.  FINDINGS: PA and lateral views of the chest. Stable lung volumes. No pneumothorax or plural effusion identified. Postoperative changes in the right lung. Stable cardiac size and mediastinal contours. Patchy right peripheral and lung base opacity appears stable. Chronic right apical scarring is stable. No acute pulmonary opacity. Osteopenia. No acute osseous abnormality identified.  IMPRESSION: Stable chest, no acute findings identified after bronchoscopy and biopsy.   Electronically Signed   By: Genevie Ann M.D.   On: 08/27/2014 11:07   Dg Chest Port 1 View  08/27/2014   CLINICAL DATA:  Shortness of breath  EXAM: PORTABLE CHEST - 1 VIEW  COMPARISON:  08/27/2014  FINDINGS: Interval increase in diffuse prominence of the interstitial markings superimposed on emphysema is noted. Increased right basilar patchy airspace opacity is identified. No pleural effusion. Heart size is normal.  IMPRESSION: Increased interstitial markings superimposed on COPD which could represent edema but is nonspecific.  Increased ill-defined patchy right lower lobe airspace opacity   Electronically Signed   By: Conchita Paris M.D.   On: 08/27/2014 15:24    ASSESSMENT AND PLAN: This is a very pleasant 74 years old  white male with history of stage IIa non-small cell lung cancer status post right upper lobectomy followed by 4 cycles of adjuvant chemotherapy.  He now presented with evidence for significant disease recurrence including lymphangitic spread of the tumor in addition to bilateral hilar and mediastinal lymphadenopathy in addition to right axillary or right subpectoral and right supraclavicular lymphadenopathy and right sided adrenal metastasis. He also has a very small brain lesion measuring 2 mm. Repeat bronchoscopy with endobronchial ultrasound and biopsies showed recurrent squamous cell carcinoma. I had a lengthy discussion with the patient and his family today about his current disease status and treatment options. The patient understands that he has  incurable condition and on the treatment will be of palliative nature. I discussed with the patient palliative care and hospice referral versus proceeding with palliative systemic chemotherapy. He is interested in proceeding with systemic chemotherapy. I recommended for the patient a regimen consisting of carboplatin for AUC of 5 on day 1 and gemcitabine 1000 MG/M2 on days 1 and 8 every 3 weeks. I discussed with the patient adverse effect of this treatment including but not limited to mild alopecia, nausea and vomiting, myelosuppression, liver or renal dysfunction. He is expected to start the first cycle of this treatment on 09/11/2014. The patient would come back for follow-up visit in 3 weeks with the next cycle of his treatment. He was advised to call immediately if he has any concerning symptoms in the interval.  The patient voices understanding of current disease status and treatment options and is in agreement with the current care plan.  All questions were answered. The patient knows to call the clinic with any problems, questions or concerns. We can certainly see the patient much sooner if necessary.  Disclaimer: This note was dictated with voice  recognition software. Similar sounding words can inadvertently be transcribed and may not be corrected upon review.

## 2014-09-10 ENCOUNTER — Telehealth: Payer: Self-pay | Admitting: Medical Oncology

## 2014-09-10 ENCOUNTER — Other Ambulatory Visit: Payer: Self-pay | Admitting: Medical Oncology

## 2014-09-10 ENCOUNTER — Encounter: Payer: Self-pay | Admitting: Radiation Oncology

## 2014-09-10 DIAGNOSIS — T451X5A Adverse effect of antineoplastic and immunosuppressive drugs, initial encounter: Principal | ICD-10-CM

## 2014-09-10 DIAGNOSIS — R110 Nausea: Secondary | ICD-10-CM

## 2014-09-10 MED ORDER — PROCHLORPERAZINE MALEATE 10 MG PO TABS: 10.0000 mg | ORAL_TABLET | Freq: Four times a day (QID) | ORAL | Status: DC | PRN

## 2014-09-10 NOTE — Telephone Encounter (Signed)
Wife notified that pt does not need labs tomorrow.

## 2014-09-10 NOTE — Progress Notes (Addendum)
Thoracic Location of Tumor / Histology: Metastatic non-small cell lung cancer recurrent, now Brain lesion  Patient presented  months ago with symptoms of:  Head aches, in occipital region   Biopsies of (if applicable) revealed: Diagnosis 08/27/14: Lymph node, biopsy, Right scalene lymph node- ONE LYMPH NODE, POSITIVE FOR METASTATIC SQUAMOUS CELL CARCINOMA (1/1). Diagnosis 08/27/14: FINE NEEDLE ASPIRATION: NEEDLE ASPIRATION: NEEDLE ASPIRATION: ENDOSCOPIC A) RIGHTLOWER LOBE BRUSHINGS (SPECIMEN 1 OF 4 COLLECTED 2/16/2016MALIGNANT CELLS PRESENT SEE COMMENT Diagnosis FINE NEEDLE ASPIRATION: NEEDLE ASPIRATION: NEEDLE ASPIRATION: ENDOSCOPIC EBUS #1,SPECIMEN B, 4 R NODE (SPECIMEN 2 OF 4 COLLECTED 08/27/2014) Diagnosis FINE NEEDLE ASPIRATION: NEEDLE ASPIRATION: NEEDLE ASPIRATION: ENDOSCOPIC EBUS #2,SPECIMEN C, 7 NODE (SPECIMEN 3 OF 4 COLLECTED 08/27/2014)MALIGNANT CELLS PRESENT SEE COMMENT Diagnosis BRONCHIAL WASHING SPECIMEN D, ( SPECIMEN 4 OF 4 COLLECTED 08/27/2014)ATYPICAL CELLS PRESENT, SEE COMMENT. COMMENT: THERE ARE RARE ATYPICAL CELLS PRESENT THAT ARE NOT DIAGNOSTIC OFMALIGNANCY. THERE IS NO CELL BLOCK AVAILABLE.  Tobacco/Marijuana/Snuff/ETOH use: quit smoking recently 1ppd 55 years 7 cans beer per week, quit smokeless tobaco 04/19/52  Past/Anticipated interventions by cardiothoracic surgery, if any: PRIOR THERAPY: 1) Status post right upper lobectomy under the care of Dr. Prescott Gum on 09/14/2012.   Past/Anticipated interventions by medical oncology, if any:)  Prior therapy: Adjuvant chemotherapy with cisplatin 75 mg/M2 and docetaxel 75 mg/M2 with Neulasta support every 3 weeks, status post 1 cycle. Starting with cycle #2, patient will receive carboplatin for an AUC initially of 4.5 and paclitaxel at 175 mg per meter squared with Neulasta support given every 3 weeks. Status post a total of 3 cycles.    ) Adjuvant chemotherapy with cisplatin 75 mg/M2 and docetaxel 75 mg/M2 with Neulasta support every  3 weeks, status post 1 cycle. Starting with cycle #2, patient will receive carboplatin for an AUC initially of 4.5 and paclitaxel at 175 mg per meter squared with Neulasta support given every 3 weeks. Status post a total of 3 cycles.   CURRENT THERAPY: Systemic chemotherapy was carboplatin for AUC of 5 on day 1 and gemcitabine 1000 MG/M2 on days 1 and 8 every 3 weeks. First dose 09/11/2014.  CHEMOTHERAPY INTENT: Palliative.  CURRENT # OF CHEMOTHERAPY CYCLES:  today 3./2/16 Carboplatin/gemzar Signs/Symptoms  Weight changes, if any: weight loss,nausea  Respiratory complaints, if any: SOB with increased exertion with mild cough, patient came to nursing after chemo therapy infusion today, walked to radiation waiting room and was having difficulty breathing, placed 2 liters n/c  oxygen on patient and escorted him to nursing, increased oxygen to 3 liter,n/c, offered warm blanket, vitals taken, gave emesis basin, coughing up clear muucous, after 10 minutes coughing has subsided and breathing better back to 20 from 24 rr  Hemoptysis, if any: none  Pain issues, if any: No, no h/a at present  SAFETY ISSUES: Yes  Prior radiation? NO  Pacemaker/ICD?  NO  Is the patient on methotrexate? NO  Current Complaints / other details:  Married,anxiety,COPD, home oxygen,  Uses 2 liters n/c, wheezing , placed 3 liters n/c on patient, called upstairs and gave report to [facility] of new patient status of breathing difficulty and uncontrolled coughing, escorted patient via w/c and 3 liters n/c oxygen  Back to infusion,gave status of patient to [facility], yellow bracelet had been placed on right wrist of patient beforehand, wife at side

## 2014-09-10 NOTE — Telephone Encounter (Signed)
Compazine rx sent to local pharmacy

## 2014-09-11 ENCOUNTER — Telehealth: Payer: Self-pay | Admitting: Internal Medicine

## 2014-09-11 ENCOUNTER — Ambulatory Visit (HOSPITAL_BASED_OUTPATIENT_CLINIC_OR_DEPARTMENT_OTHER): Payer: Medicare Other

## 2014-09-11 ENCOUNTER — Encounter: Payer: Self-pay | Admitting: *Deleted

## 2014-09-11 ENCOUNTER — Ambulatory Visit (HOSPITAL_BASED_OUTPATIENT_CLINIC_OR_DEPARTMENT_OTHER): Payer: Medicare Other | Admitting: Nurse Practitioner

## 2014-09-11 ENCOUNTER — Ambulatory Visit
Admission: RE | Admit: 2014-09-11 | Discharge: 2014-09-11 | Disposition: A | Discharge status: Home / Self Care | Payer: Medicare Other | Source: Ambulatory Visit | Attending: Radiation Oncology | Admitting: Radiation Oncology

## 2014-09-11 ENCOUNTER — Encounter: Payer: Self-pay | Admitting: Radiation Oncology

## 2014-09-11 ENCOUNTER — Other Ambulatory Visit: Payer: Self-pay | Admitting: Nurse Practitioner

## 2014-09-11 ENCOUNTER — Telehealth: Payer: Self-pay | Admitting: Medical Oncology

## 2014-09-11 ENCOUNTER — Other Ambulatory Visit: Payer: Medicare Other

## 2014-09-11 ENCOUNTER — Encounter: Payer: Self-pay | Admitting: Nurse Practitioner

## 2014-09-11 VITALS — BP 160/99 | HR 109 | Temp 97.7°F | Resp 20 | Ht 69.0 in | Wt 175.3 lb

## 2014-09-11 DIAGNOSIS — C7931 Secondary malignant neoplasm of brain: Secondary | ICD-10-CM | POA: Insufficient documentation

## 2014-09-11 DIAGNOSIS — C3411 Malignant neoplasm of upper lobe, right bronchus or lung: Secondary | ICD-10-CM

## 2014-09-11 DIAGNOSIS — C3491 Malignant neoplasm of unspecified part of right bronchus or lung: Secondary | ICD-10-CM | POA: Diagnosis present

## 2014-09-11 DIAGNOSIS — C3401 Malignant neoplasm of right main bronchus: Secondary | ICD-10-CM

## 2014-09-11 DIAGNOSIS — Z5111 Encounter for antineoplastic chemotherapy: Secondary | ICD-10-CM | POA: Diagnosis not present

## 2014-09-11 DIAGNOSIS — C7971 Secondary malignant neoplasm of right adrenal gland: Secondary | ICD-10-CM

## 2014-09-11 DIAGNOSIS — R0600 Dyspnea, unspecified: Secondary | ICD-10-CM | POA: Insufficient documentation

## 2014-09-11 DIAGNOSIS — Z87891 Personal history of nicotine dependence: Secondary | ICD-10-CM | POA: Diagnosis not present

## 2014-09-11 DIAGNOSIS — C3430 Malignant neoplasm of lower lobe, unspecified bronchus or lung: Secondary | ICD-10-CM

## 2014-09-11 MED ORDER — DEXAMETHASONE SODIUM PHOSPHATE 20 MG/5ML IJ SOLN
20.0000 mg | Freq: Once | INTRAMUSCULAR | Status: AC
  Administered 2014-09-11: 20 mg via INTRAVENOUS

## 2014-09-11 MED ORDER — SODIUM CHLORIDE 0.9 % IV SOLN
Freq: Once | INTRAVENOUS | Status: AC
  Administered 2014-09-11: 09:00:00 via INTRAVENOUS

## 2014-09-11 MED ORDER — ONDANSETRON 16 MG/50ML IVPB (CHCC)
INTRAVENOUS | Status: AC
  Filled 2014-09-11: qty 16

## 2014-09-11 MED ORDER — DEXAMETHASONE SODIUM PHOSPHATE 20 MG/5ML IJ SOLN
INTRAMUSCULAR | Status: AC
  Filled 2014-09-11: qty 5

## 2014-09-11 MED ORDER — CARBOPLATIN CHEMO INJECTION 600 MG/60ML
502.0000 mg | Freq: Once | INTRAVENOUS | Status: AC
  Administered 2014-09-11: 500 mg via INTRAVENOUS
  Filled 2014-09-11: qty 50

## 2014-09-11 MED ORDER — ONDANSETRON 16 MG/50ML IVPB (CHCC)
16.0000 mg | Freq: Once | INTRAVENOUS | Status: AC
  Administered 2014-09-11: 16 mg via INTRAVENOUS

## 2014-09-11 MED ORDER — ALBUTEROL SULFATE (2.5 MG/3ML) 0.083% IN NEBU
2.5000 mg | INHALATION_SOLUTION | Freq: Once | RESPIRATORY_TRACT | Status: AC
  Administered 2014-09-11: 2.5 mg via RESPIRATORY_TRACT
  Filled 2014-09-11: qty 3

## 2014-09-11 MED ORDER — SODIUM CHLORIDE 0.9 % IV SOLN
1000.0000 mg/m2 | Freq: Once | INTRAVENOUS | Status: AC
  Administered 2014-09-11: 1976 mg via INTRAVENOUS
  Filled 2014-09-11: qty 51.97

## 2014-09-11 NOTE — Progress Notes (Signed)
[place], NP chairside.

## 2014-09-11 NOTE — Progress Notes (Signed)
[facility] Psychosocial Distress Screening Clinical Social Work  Clinical Social Work was referred by distress screening protocol.  The patient scored a 10 on the Psychosocial Distress Thermometer which indicates severe distress. Clinical Social Worker met with patient after radiation oncology visit to assess for distress and other psychosocial needs. Mr. Bascom measured his level of distress around a 5 after meeting with Dr. Lisbeth Renshaw.  He shared he feels confident in the oncologist's plan at this time.  Mr. Dieujuste was eager to leave after today's appointment, CSW spoke briefly with patient.  CSW asked patient to elaborate on feelings of depression/anxiety.  Patient became tearful and shared he has "felt depressed ever since learning about the cancer".    [facility] DISTRESS SCREENING 09/11/2014  Screening Type Initial Screening  Distress experienced in past week (1-10) 10  Emotional problem type Depression;Nervousness/Anxiety  Spiritual/Religous concerns type Loss of sense of purpose  Physical Problem type Pain;Sleep/insomnia;Breathing;Mouth sores/swallowing;Loss of appetitie;Constipation/diarrhea  Physician notified of physical symptoms Yes  Referral to clinical social work Yes  Referral to dietition Yes    Clinical Social Worker follow up needed: Yes.    If yes, follow up plan:  CSW plans to set up appointment with patient to provide supportive counseling.  Polo Riley, MSW, LCSW, OSW-C Clinical Social Worker [facility] [phone]

## 2014-09-11 NOTE — Patient Instructions (Signed)
[facility] Discharge Instructions for Patients Receiving Chemotherapy  Today you received the following chemotherapy agents Carboplatin/Gemzar  To help prevent nausea and vomiting after your treatment, we encourage you to take your nausea medication    If you develop nausea and vomiting that is not controlled by your nausea medication, call the clinic.   BELOW ARE SYMPTOMS THAT SHOULD BE REPORTED IMMEDIATELY:  *FEVER GREATER THAN 100.5 F  *CHILLS WITH OR WITHOUT FEVER  NAUSEA AND VOMITING THAT IS NOT CONTROLLED WITH YOUR NAUSEA MEDICATION  *UNUSUAL SHORTNESS OF BREATH  *UNUSUAL BRUISING OR BLEEDING  TENDERNESS IN MOUTH AND THROAT WITH OR WITHOUT PRESENCE OF ULCERS  *URINARY PROBLEMS  *BOWEL PROBLEMS  UNUSUAL RASH Items with * indicate a potential emergency and should be followed up as soon as possible.  Feel free to call the clinic you have any questions or concerns. The clinic phone number is [phone].  Carboplatin injection What is this medicine? CARBOPLATIN (KAR boe pla tin) is a chemotherapy drug. It targets fast dividing cells, like cancer cells, and causes these cells to die. This medicine is used to treat ovarian cancer and many other cancers. This medicine may be used for other purposes; ask your health care provider or pharmacist if you have questions. COMMON BRAND NAME(S): Paraplatin What should I tell my health care provider before I take this medicine? They need to know if you have any of these conditions: -blood disorders -hearing problems -kidney disease -recent or ongoing radiation therapy -an unusual or allergic reaction to carboplatin, cisplatin, other chemotherapy, other medicines, foods, dyes, or preservatives -pregnant or trying to get pregnant -breast-feeding How should I use this medicine? This drug is usually given as an infusion into a vein. It is administered in a hospital or clinic by a specially trained health care  professional. Talk to your pediatrician regarding the use of this medicine in children. Special care may be needed. Overdosage: If you think you have taken too much of this medicine contact a poison control center or emergency room at once. NOTE: This medicine is only for you. Do not share this medicine with others. What if I miss a dose? It is important not to miss a dose. Call your doctor or health care professional if you are unable to keep an appointment. What may interact with this medicine? -medicines for seizures -medicines to increase blood counts like filgrastim, pegfilgrastim, sargramostim -some antibiotics like amikacin, gentamicin, neomycin, streptomycin, tobramycin -vaccines Talk to your doctor or health care professional before taking any of these medicines: -acetaminophen -aspirin -ibuprofen -ketoprofen -naproxen This list may not describe all possible interactions. Give your health care provider a list of all the medicines, herbs, non-prescription drugs, or dietary supplements you use. Also tell them if you smoke, drink alcohol, or use illegal drugs. Some items may interact with your medicine. What should I watch for while using this medicine? Your condition will be monitored carefully while you are receiving this medicine. You will need important blood work done while you are taking this medicine. This drug may make you feel generally unwell. This is not uncommon, as chemotherapy can affect healthy cells as well as cancer cells. Report any side effects. Continue your course of treatment even though you feel ill unless your doctor tells you to stop. In some cases, you may be given additional medicines to help with side effects. Follow all directions for their use. Call your doctor or health care professional for advice if you get a fever,  chills or sore throat, or other symptoms of a cold or flu. Do not treat yourself. This drug decreases your body's ability to fight infections.  Try to avoid being around people who are sick. This medicine may increase your risk to bruise or bleed. Call your doctor or health care professional if you notice any unusual bleeding. Be careful brushing and flossing your teeth or using a toothpick because you may get an infection or bleed more easily. If you have any dental work done, tell your dentist you are receiving this medicine. Avoid taking products that contain aspirin, acetaminophen, ibuprofen, naproxen, or ketoprofen unless instructed by your doctor. These medicines may hide a fever. Do not become pregnant while taking this medicine. Women should inform their doctor if they wish to become pregnant or think they might be pregnant. There is a potential for serious side effects to an unborn child. Talk to your health care professional or pharmacist for more information. Do not breast-feed an infant while taking this medicine. What side effects may I notice from receiving this medicine? Side effects that you should report to your doctor or health care professional as soon as possible: -allergic reactions like skin rash, itching or hives, swelling of the face, lips, or tongue -signs of infection - fever or chills, cough, sore throat, pain or difficulty passing urine -signs of decreased platelets or bleeding - bruising, pinpoint red spots on the skin, black, tarry stools, nosebleeds -signs of decreased red blood cells - unusually weak or tired, fainting spells, lightheadedness -breathing problems -changes in hearing -changes in vision -chest pain -high blood pressure -low blood counts - This drug may decrease the number of white blood cells, red blood cells and platelets. You may be at increased risk for infections and bleeding. -nausea and vomiting -pain, swelling, redness or irritation at the injection site -pain, tingling, numbness in the hands or feet -problems with balance, talking, walking -trouble passing urine or change in the  amount of urine Side effects that usually do not require medical attention (report to your doctor or health care professional if they continue or are bothersome): -hair loss -loss of appetite -metallic taste in the mouth or changes in taste This list may not describe all possible side effects. Call your doctor for medical advice about side effects. You may report side effects to FDA at 1-800-FDA-1088. Where should I keep my medicine? This drug is given in a hospital or clinic and will not be stored at home. NOTE: This sheet is a summary. It may not cover all possible information. If you have questions about this medicine, talk to your doctor, pharmacist, or health care provider.  2015, Elsevier/Gold Standard. (2007-10-03 14:38:05)   Gemcitabine injection What is this medicine? GEMCITABINE (jem SIT a been) is a chemotherapy drug. This medicine is used to treat many types of cancer like breast cancer, lung cancer, pancreatic cancer, and ovarian cancer. This medicine may be used for other purposes; ask your health care provider or pharmacist if you have questions. COMMON BRAND NAME(S): Gemzar What should I tell my health care provider before I take this medicine? They need to know if you have any of these conditions: -blood disorders -infection -kidney disease -liver disease -recent or ongoing radiation therapy -an unusual or allergic reaction to gemcitabine, other chemotherapy, other medicines, foods, dyes, or preservatives -pregnant or trying to get pregnant -breast-feeding How should I use this medicine? This drug is given as an infusion into a vein. It is administered in a  hospital or clinic by a specially trained health care professional. Talk to your pediatrician regarding the use of this medicine in children. Special care may be needed. Overdosage: If you think you have taken too much of this medicine contact a poison control center or emergency room at once. NOTE: This medicine  is only for you. Do not share this medicine with others. What if I miss a dose? It is important not to miss your dose. Call your doctor or health care professional if you are unable to keep an appointment. What may interact with this medicine? -medicines to increase blood counts like filgrastim, pegfilgrastim, sargramostim -some other chemotherapy drugs like cisplatin -vaccines Talk to your doctor or health care professional before taking any of these medicines: -acetaminophen -aspirin -ibuprofen -ketoprofen -naproxen This list may not describe all possible interactions. Give your health care provider a list of all the medicines, herbs, non-prescription drugs, or dietary supplements you use. Also tell them if you smoke, drink alcohol, or use illegal drugs. Some items may interact with your medicine. What should I watch for while using this medicine? Visit your doctor for checks on your progress. This drug may make you feel generally unwell. This is not uncommon, as chemotherapy can affect healthy cells as well as cancer cells. Report any side effects. Continue your course of treatment even though you feel ill unless your doctor tells you to stop. In some cases, you may be given additional medicines to help with side effects. Follow all directions for their use. Call your doctor or health care professional for advice if you get a fever, chills or sore throat, or other symptoms of a cold or flu. Do not treat yourself. This drug decreases your body's ability to fight infections. Try to avoid being around people who are sick. This medicine may increase your risk to bruise or bleed. Call your doctor or health care professional if you notice any unusual bleeding. Be careful brushing and flossing your teeth or using a toothpick because you may get an infection or bleed more easily. If you have any dental work done, tell your dentist you are receiving this medicine. Avoid taking products that contain  aspirin, acetaminophen, ibuprofen, naproxen, or ketoprofen unless instructed by your doctor. These medicines may hide a fever. Women should inform their doctor if they wish to become pregnant or think they might be pregnant. There is a potential for serious side effects to an unborn child. Talk to your health care professional or pharmacist for more information. Do not breast-feed an infant while taking this medicine. What side effects may I notice from receiving this medicine? Side effects that you should report to your doctor or health care professional as soon as possible: -allergic reactions like skin rash, itching or hives, swelling of the face, lips, or tongue -low blood counts - this medicine may decrease the number of white blood cells, red blood cells and platelets. You may be at increased risk for infections and bleeding. -signs of infection - fever or chills, cough, sore throat, pain or difficulty passing urine -signs of decreased platelets or bleeding - bruising, pinpoint red spots on the skin, black, tarry stools, blood in the urine -signs of decreased red blood cells - unusually weak or tired, fainting spells, lightheadedness -breathing problems -chest pain -mouth sores -nausea and vomiting -pain, swelling, redness at site where injected -pain, tingling, numbness in the hands or feet -stomach pain -swelling of ankles, feet, hands -unusual bleeding Side effects that usually do not  require medical attention (report to your doctor or health care professional if they continue or are bothersome): -constipation -diarrhea -hair loss -loss of appetite -stomach upset This list may not describe all possible side effects. Call your doctor for medical advice about side effects. You may report side effects to FDA at 1-800-FDA-1088. Where should I keep my medicine? This drug is given in a hospital or clinic and will not be stored at home. NOTE: This sheet is a summary. It may not cover all  possible information. If you have questions about this medicine, talk to your doctor, pharmacist, or health care provider.  2015, Elsevier/Gold Standard. (2007-11-07 18:45:54)

## 2014-09-11 NOTE — Progress Notes (Signed)
Patient finished nebulizer oxygen saturation 96%

## 2014-09-11 NOTE — Progress Notes (Signed)
Please see the Nurse Progress Note in the MD Initial Consult Encounter for this patient. 

## 2014-09-11 NOTE — Telephone Encounter (Signed)
Faxed CON for "e codes and K code"

## 2014-09-11 NOTE — Progress Notes (Signed)
Vitals reported to Darren Lesser, NP patient to be transported via wheelchair per [name], patient verbalized understanding of the above plan.

## 2014-09-11 NOTE — Progress Notes (Signed)
Had called [name] social worker of 10  On distress screening, left message patient back in infusion 12:03 PM  12:02 PM

## 2014-09-11 NOTE — Progress Notes (Signed)
SYMPTOM MANAGEMENT CLINIC   HPI: [name] 74 y.o. male diagnosed with lung cancer.  Patient status post lobectomy in March 2014, cisplatin/docetaxel chemotherapy 1 cycle and carboplatin/paclitaxel chemotherapy 3 cycles completed approximately 2 years ago.  Here today to initiate carboplatin/gemcitabine chemotherapy; as well as a one-time brain radiation treatment for diagnosed brain metastasis.  Patient completed cycle 1, day 1 of his carboplatin/gemcitabine chemotherapy earlier this morning with no difficulty whatsoever.  He had walked downstairs to radiation oncology for his brain radiation treatment; and was found to be quite dyspneic on initial presentation.  Patient was also noted to be wheezing audibly.  Patient weighs returned back up to medical oncology for further evaluation and management.  Patient states that he has oxygen at home to use on an as-needed basis; but he typically only uses it occasionally at night.  He also has an albuterol nebulizer at home as well that he uses occasionally.  Patient denies any other new symptoms whatsoever.  He also denies any recent fevers or chills.   HPI  ROS  Past Medical History  Diagnosis Date  . Arthritis   . Wheezing   . Sore throat   . Carotid artery occlusion   . COPD (chronic obstructive pulmonary disease)   . Lumbar disc disease   . Restless leg syndrome   . Allergic rhinitis   . Hypertension   . Anxiety     anxiety  . Shortness of breath   . Lung mass   . GERD (gastroesophageal reflux disease)   . H/O hiatal hernia   . Pre-operative cardiovascular examination   . Nonspecific abnormal unspecified cardiovascular function study   . Peripheral vascular disease, unspecified   . Pneumonia 2014    ?   Marland Kitchen On home oxygen therapy     prn  . Constipation   . Cancer 09/14/12    invasive mod diff squamous cell ca    Past Surgical History  Procedure Laterality Date  . Carotid endarterectomy  10/15/2010    left  . Hand  surgery      repair of the left long, ring, and small finger after a saw injury  . Video bronchoscopy  07/24/2012    Procedure: VIDEO BRONCHOSCOPY WITH FLUORO;  Surgeon: Kathee Delton, MD;  Location: [facility];  Service: Cardiopulmonary;  Laterality: Bilateral;  . Lobectomy Right 09/14/2012    Procedure: LOBECTOMY;  Surgeon: Ivin Poot, MD;  Location: [facility];  Service: Thoracic;  Laterality: Right;  RIGHT UPPER LOBECTOMY  . Video assisted thoracoscopy Right 09/14/2012    Procedure: VIDEO ASSISTED THORACOSCOPY;  Surgeon: Ivin Poot, MD;  Location: [facility];  Service: Thoracic;  Laterality: Right;  . Video bronchoscopy with endobronchial ultrasound N/A 08/27/2014    Procedure: VIDEO BRONCHOSCOPY WITH ENDOBRONCHIAL ULTRASOUND;  Surgeon: Ivin Poot, MD;  Location: [facility] OR;  Service: Thoracic;  Laterality: N/A;  . Scalene node biopsy Right 08/27/2014    Procedure: BIOPSY SCALENE NODE;  Surgeon: Ivin Poot, MD;  Location: [facility] OR;  Service: Thoracic;  Laterality: Right;    has Occlusion and stenosis of carotid artery without mention of cerebral infarction; COPD (chronic obstructive pulmonary disease); Lung mass; Pre-operative cardiovascular examination; Nonspecific abnormal unspecified cardiovascular function study; Peripheral vascular disease, unspecified; Lung cancer; Pain in limb; Coronary atherosclerosis of native coronary artery; Essential hypertension, benign; Tobacco use disorder; Carotid stenosis; Aftercare following surgery of the circulatory system; Weakness-Hand and  Legs; COPD exacerbation; Tachycardia; and Dyspnea on his problem list.  has No Known Allergies.    Medication List       This list is accurate as of: 09/11/14  1:05 PM.  Always use your most recent med list.               aspirin EC 81 MG tablet  Take 81 mg by mouth daily. Takes one tablet every evening.     clonazePAM 0.5 MG tablet  Commonly known as:  KLONOPIN  Take 0.5 mg by mouth 2 (two) times daily as  needed for anxiety. Takes one tablet by mouth two times daily as needed for anxiety.     HYDROcodone-acetaminophen 7.5-325 MG per tablet  Commonly known as:  NORCO  Take 1 tablet by mouth every 4 (four) hours as needed for pain.     isosorbide mononitrate 30 MG 24 hr tablet  Commonly known as:  IMDUR  Take 1 tablet (30 mg total) by mouth daily.     omeprazole 40 MG capsule  Commonly known as:  PRILOSEC  Take 40 mg by mouth daily.     pramipexole 1 MG tablet  Commonly known as:  MIRAPEX  Take 1 mg by mouth at bedtime.     promethazine-codeine 6.25-10 MG/5ML syrup  Commonly known as:  PHENERGAN with CODEINE  Take 5 mLs by mouth every 6 (six) hours as needed for cough.     zolpidem 10 MG tablet  Commonly known as:  AMBIEN  Take 10 mg by mouth at bedtime as needed for sleep.         PHYSICAL EXAMINATION  Vitals: BP 153/81, HR 103, temp 97,7, sat 95 % on room air at final check prior to discharge back down to [place].    Physical Exam  Constitutional: He is oriented to person, place, and time. He appears unhealthy.  Patient appears fatigued and chronically ill.  HENT:  Head: Normocephalic and atraumatic.  Mouth/Throat: Oropharynx is clear and moist.  Eyes: Conjunctivae and EOM are normal. Pupils are equal, round, and reactive to light. Right eye exhibits no discharge. Left eye exhibits no discharge. No scleral icterus.  Neck: Normal range of motion. Neck supple. No JVD present. No tracheal deviation present. No thyromegaly present.  Cardiovascular: Normal heart sounds and intact distal pulses.   Tachycardic rate of 103  Pulmonary/Chest: No stridor. No respiratory distress. He has wheezes. He has no rales. He exhibits no tenderness.  Diminished breath sounds bilaterally; also noted to have wheezing to all lung fields.  Patient does appear short of breath; but no acute respiratory distress.  Abdominal: Soft. Bowel sounds are normal. He exhibits no distension and no mass. There  is no tenderness. There is no rebound and no guarding.  Musculoskeletal: Normal range of motion. He exhibits no edema or tenderness.  Lymphadenopathy:    He has no cervical adenopathy.  Neurological: He is alert and oriented to person, place, and time.  Skin: Skin is warm and dry. No rash noted. No erythema. There is pallor.  Psychiatric: Affect normal.  Nursing note and vitals reviewed.   LABORATORY DATA:. Appointment on 09/09/2014  Component Date Value Ref Range Status  . WBC 09/09/2014 8.6  4.0 - 10.3 10e3/uL Final  . NEUT# 09/09/2014 5.8  1.5 - 6.5 10e3/uL Final  . HGB 09/09/2014 13.9  13.0 - 17.1 g/dL Final  . HCT 09/09/2014 41.3  38.4 - 49.9 % Final  . Platelets 09/09/2014 305  140 - 400 10e3/uL Final  . MCV 09/09/2014 93.2  79.3 -  98.0 fL Final  . MCH 09/09/2014 31.4  27.2 - 33.4 pg Final  . MCHC 09/09/2014 33.7  32.0 - 36.0 g/dL Final  . RBC 09/09/2014 4.43  4.20 - 5.82 10e6/uL Final  . RDW 09/09/2014 13.1  11.0 - 14.6 % Final  . lymph# 09/09/2014 1.9  0.9 - 3.3 10e3/uL Final  . MONO# 09/09/2014 0.8  0.1 - 0.9 10e3/uL Final  . Eosinophils Absolute 09/09/2014 0.1  0.0 - 0.5 10e3/uL Final  . Basophils Absolute 09/09/2014 0.0  0.0 - 0.1 10e3/uL Final  . NEUT% 09/09/2014 67.4  39.0 - 75.0 % Final  . LYMPH% 09/09/2014 21.9  14.0 - 49.0 % Final  . MONO% 09/09/2014 9.0  0.0 - 14.0 % Final  . EOS% 09/09/2014 1.2  0.0 - 7.0 % Final  . BASO% 09/09/2014 0.5  0.0 - 2.0 % Final  . Sodium 09/09/2014 134* 136 - 145 mEq/L Final  . Potassium 09/09/2014 4.0  3.5 - 5.1 mEq/L Final  . Chloride 09/09/2014 100  98 - 109 mEq/L Final  . CO2 09/09/2014 27  22 - 29 mEq/L Final  . Glucose 09/09/2014 89  70 - 140 mg/dl Final  . BUN 09/09/2014 8.0  7.0 - 26.0 mg/dL Final  . Creatinine 09/09/2014 0.7  0.7 - 1.3 mg/dL Final  . Total Bilirubin 09/09/2014 0.31  0.20 - 1.20 mg/dL Final  . Alkaline Phosphatase 09/09/2014 83  40 - 150 U/L Final  . AST 09/09/2014 16  5 - 34 U/L Final  . ALT 09/09/2014  13  0 - 55 U/L Final  . Total Protein 09/09/2014 6.8  6.4 - 8.3 g/dL Final  . Albumin 09/09/2014 3.3* 3.5 - 5.0 g/dL Final  . Calcium 09/09/2014 9.4  8.4 - 10.4 mg/dL Final  . Anion Gap 09/09/2014 7  3 - 11 mEq/L Final  . EGFR 09/09/2014 >90  >90 ml/min/1.73 m2 Final   eGFR is calculated using the CKD-EPI Creatinine Equation (2009)     RADIOGRAPHIC STUDIES: No results found.  ASSESSMENT/PLAN:    Dyspnea Patient received his first cycle of carboplatin/gemcitabine this morning for recurrence of his colon cancer.  He tolerated this first cycle with no difficulties.  He had walked downstairs to radiation oncology for a one-time brain radiation treatment brain metastasis.  Upon arriving to radiation oncology patient was found to be dyspneic; with wheezing.  Patient was then transported back upstairs to medical oncology.  On exam-patient did appear mild to moderately short of breath; with decreased breath sounds bilaterally.  Patient's also noted to have wheezing to all lung fields.  Patient in no acute respiratory distress.  Patient states that he does not typically use O2; except on an as-needed basis at home.  Patient was placed initially on 3 L nasal cannula while cancer Center; but then gradually decrease oxygen down to 1 L via nasal cannula.  Patient was given an albuterol nebulizer treatment;-with both his shortness of breath and wheezing greatly relieved.  Following the nebulizer treatment-vital signs returned to baseline; with an O2 sat of 95% on room air.  Patient will be transported in a wheelchair with O2 if needed back down to radiation oncology so he can receive his brain radiation treatment.  Advised the patient and his wife to make sure he is transported out to the [facility] in a wheelchair.  Patient states that he does have an O2 tank in the car if needed.  Also, patient was advised that he may need to increase  his albuterol nebulizer treatments to an every four-hour  basis for the next few days.  Patient and his wife are also advised to call/return or go directed to the emergency department if he develops any worsening symptoms whatsoever.   Lung cancer Patient is status post right lobectomy in March 2014.  He also completed 1 cycle of cisplatin/docetaxel chemotherapy; as well as 3 cycles of carboplatin/paclitaxel approximately 2 years ago.  Recent restaging scan revealed recurrence of patient's lung cancer.  Patient received cycle 1, day 1 of his carboplatin/gemcitabine chemotherapy regimen earlier today with no difficulty.  He will receive whole brain irradiation later this afternoon 1 treatment only for diagnosed brain metastasis.  Patient has plans to return on 09/18/2014 for labs and cycle 1, day 8 of the gemcitabine only portion of his chemotherapy.  Patient knows to call in the interim if he has any new worries or concerns whatsoever.   Patient stated understanding of all instructions; and was in agreement with this plan of care. The patient knows to call the clinic with any problems, questions or concerns.   Review/collaboration with [name] and Dr. Lisbeth Renshaw regarding all aspects of patient's visit today.   Total time spent with patient was 25 minutes;  with greater than 75 percent of that time spent in face to face counseling regarding patient's symptoms,  and coordination of care and follow up.  Disclaimer: This note was dictated with voice recognition software. Similar sounding words can inadvertently be transcribed and may not be corrected upon review.   Drue Second, NP 09/11/2014

## 2014-09-11 NOTE — Telephone Encounter (Signed)
Per 03/02 POF scheduled pt with CB seen in infusion room, sent msg to MW to register pt for today.... KJ

## 2014-09-11 NOTE — Progress Notes (Signed)
1150 Patient transported via wheelchair from radiation oncology by Thayer Headings, [place]. Patient states he felt more shortness of breath then he did when he was discharged from the chemotherapy infusion clinic. Patient presented with 3 liters of oxygen via nasal canula.Patient denied chest pain, lightheadedness, or dizziness. [name], NP notified.  1158 Patient states he doesn't normally use oxygen at home. Oxygen decreased to 1 liter oxygen.

## 2014-09-11 NOTE — Progress Notes (Signed)
1215 Albuterol nebulizer order given and administered to patient per [name], NP.

## 2014-09-11 NOTE — Assessment & Plan Note (Signed)
Patient is status post right lobectomy in March 2014.  He also completed 1 cycle of cisplatin/docetaxel chemotherapy; as well as 3 cycles of carboplatin/paclitaxel approximately 2 years ago.  Recent restaging scan revealed recurrence of patient's lung cancer.  Patient received cycle 1, day 1 of his carboplatin/gemcitabine chemotherapy regimen earlier today with no difficulty.  He will receive whole brain irradiation later this afternoon 1 treatment only for diagnosed brain metastasis.  Patient has plans to return on 09/18/2014 for labs and cycle 1, day 8 of the gemcitabine only portion of his chemotherapy.  Patient knows to call in the interim if he has any new worries or concerns whatsoever.

## 2014-09-11 NOTE — Assessment & Plan Note (Signed)
Patient received his first cycle of carboplatin/gemcitabine this morning for recurrence of his colon cancer.  He tolerated this first cycle with no difficulties.  He had walked downstairs to radiation oncology for a one-time brain radiation treatment brain metastasis.  Upon arriving to radiation oncology patient was found to be dyspneic; with wheezing.  Patient was then transported back upstairs to medical oncology.  On exam-patient did appear mild to moderately short of breath; with decreased breath sounds bilaterally.  Patient's also noted to have wheezing to all lung fields.  Patient in no acute respiratory distress.  Patient states that he does not typically use O2; except on an as-needed basis at home.  Patient was placed initially on 3 L nasal cannula while cancer Center; but then gradually decrease oxygen down to 1 L via nasal cannula.  Patient was given an albuterol nebulizer treatment;-with both his shortness of breath and wheezing greatly relieved.  Following the nebulizer treatment-vital signs returned to baseline; with an O2 sat of 95% on room air.  Patient will be transported in a wheelchair with O2 if needed back down to radiation oncology so he can receive his brain radiation treatment.  Advised the patient and his wife to make sure he is transported out to the [facility] in a wheelchair.  Patient states that he does have an O2 tank in the car if needed.  Also, patient was advised that he may need to increase his albuterol nebulizer treatments to an every four-hour basis for the next few days.  Patient and his wife are also advised to call/return or go directed to the emergency department if he develops any worsening symptoms whatsoever.

## 2014-09-11 NOTE — Progress Notes (Addendum)
Radiation Oncology         [phone]) [phone] ________________________________  Name: [name] MRN: [id number]  Date: 09/11/2014  DOB: October 12, 1940  WF:UXNAT, Jaymes Graff, MD  Curt Bears, MD     REFERRING PHYSICIAN: Curt Bears, MD   DIAGNOSIS: The primary encounter diagnosis was Brain metastasis. A diagnosis of Malignant neoplasm of hilus of right lung was also pertinent to this visit.   HISTORY OF PRESENT ILLNESS::[name] is a 74 y.o. 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 He underwent a right upper lobectomy in March 2014 followed by adjuvant chemotherapy.  More recently the patient underwent a repeat CT scan of the chest as well as a PET scan which suggested recurrent, metastatic disease. A PET scan on 08/10/2014 showed findings consistent with recurrence involving widespread lymphogenic spread of disease throughout the lungs bilaterally and mediastinum in addition to right axillary, right sub-pectoral, right supraclavicular lymphadenopathy and a probable right adrenal metastasis.  The patient has undergone a biopsy of a right scalene node which corresponds to a fine needle aspiration. This was consistent with metastatic squamous cell carcinoma.  The patient has also undergone some brain imaging recently. An MRI scan of the brain was completed on 08/08/2014. A new 2 mm enhancing focus consistent with a solitary metastasis was seen within the right parietal lobe. The patient states that he has been having some increasing headaches which are becoming more daily in nature over the last couple of weeks. I have therefore been asked to see the patient today for consideration of radiosurgery to this area. He is proceeding with palliative chemotherapy currently,  having received treatment through medical oncology this morning.  The patient did have some shortness of breath when he was seen in clinic today. He was seen by the symptom management clinic prior to his visit with me today. He received a breathing treatment as well as supplemental oxygen and was breathing much better. He denies any significant chest pain on a regular basis currently.    PREVIOUS RADIATION THERAPY: No   PAST MEDICAL HISTORY:  has a past medical history of Arthritis; Wheezing; Sore throat; Carotid artery occlusion; COPD (chronic obstructive pulmonary disease); Lumbar disc disease; Restless leg syndrome; Allergic rhinitis; Hypertension; Anxiety; Shortness of breath; Lung mass; GERD (gastroesophageal reflux disease); H/O hiatal hernia; Pre-operative cardiovascular examination; Nonspecific abnormal unspecified cardiovascular function study; Peripheral vascular disease, unspecified; Pneumonia (2014); On home oxygen therapy; Constipation; and Cancer (09/14/12).     PAST SURGICAL HISTORY: Past Surgical History  Procedure Laterality Date  . Carotid endarterectomy  10/15/2010    left  . Hand surgery      repair of the left long, ring, and small finger after a saw injury  . Video bronchoscopy  07/24/2012    Procedure: VIDEO BRONCHOSCOPY WITH FLUORO;  Surgeon: Kathee Delton, MD;  Location: [facility];  Service: Cardiopulmonary;  Laterality: Bilateral;  . Lobectomy Right 09/14/2012    Procedure: LOBECTOMY;  Surgeon: Ivin Poot, MD;  Location: [facility];  Service: Thoracic;  Laterality: Right;  RIGHT UPPER LOBECTOMY  . Video assisted thoracoscopy Right 09/14/2012    Procedure: VIDEO ASSISTED THORACOSCOPY;  Surgeon: Ivin Poot, MD;  Location: [facility];  Service: Thoracic;  Laterality: Right;  . Video bronchoscopy with endobronchial ultrasound N/A 08/27/2014  Procedure: VIDEO BRONCHOSCOPY WITH ENDOBRONCHIAL ULTRASOUND;  Surgeon: Ivin Poot, MD;  Location: [facility] OR;  Service: Thoracic;   Laterality: N/A;  . Scalene node biopsy Right 08/27/2014    Procedure: BIOPSY SCALENE NODE;  Surgeon: Ivin Poot, MD;  Location: [facility];  Service: Thoracic;  Laterality: Right;     FAMILY HISTORY: family history includes Alcohol abuse in his mother; Depression in his sister; Heart attack in his brother and father; Heart disease in his brother and father; Stroke in his brother.   SOCIAL HISTORY:  reports that he quit smoking about 3 weeks ago. His smoking use included Cigarettes. He has a 55 pack-year smoking history. He quit smokeless tobacco use about 62 years ago. He reports that he drinks alcohol. He reports that he does not use illicit drugs.   ALLERGIES: Review of patient's allergies indicates no known allergies.   MEDICATIONS:  Current Outpatient Prescriptions  Medication Sig Dispense Refill  . aspirin EC 81 MG tablet Take 81 mg by mouth daily. Takes one tablet every evening.    . clonazePAM (KLONOPIN) 0.5 MG tablet Take 0.5 mg by mouth 2 (two) times daily as needed for anxiety. Takes one tablet by mouth two times daily as needed for anxiety.    Marland Kitchen HYDROcodone-acetaminophen (NORCO) 7.5-325 MG per tablet Take 1 tablet by mouth every 4 (four) hours as needed for pain. 40 tablet 0  . isosorbide mononitrate (IMDUR) 30 MG 24 hr tablet Take 1 tablet (30 mg total) by mouth daily. 90 tablet 3  . omeprazole (PRILOSEC) 40 MG capsule Take 40 mg by mouth daily.    . pramipexole (MIRAPEX) 1 MG tablet Take 1 mg by mouth at bedtime.    . promethazine-codeine (PHENERGAN WITH CODEINE) 6.25-10 MG/5ML syrup Take 5 mLs by mouth every 6 (six) hours as needed for cough. 120 mL 0  . zolpidem (AMBIEN) 10 MG tablet Take 10 mg by mouth at bedtime as needed for sleep.     No current facility-administered medications for this encounter.     REVIEW OF SYSTEMS:  A 15 point review of systems is documented in the electronic medical record. This was obtained by the nursing staff. However, I reviewed this with  the patient to discuss relevant findings and make appropriate changes.  Pertinent items are noted in HPI.    PHYSICAL EXAM:  height is 5\' 9"  (1.753 m) and weight is 175 lb 4.8 oz (79.516 kg). His oral temperature is 97.7 F (36.5 C). His blood pressure is 160/99 and his pulse is 109. His respiration is 20 and oxygen saturation is 95%.   ECOG = 1  0 - Asymptomatic (Fully active, able to carry on all predisease activities without restriction)  1 - Symptomatic but completely ambulatory (Restricted in physically strenuous activity but ambulatory and able to carry out work of a light or sedentary nature. For example, light housework, office work)  2 - Symptomatic, <50% in bed during the day (Ambulatory and capable of all self care but unable to carry out any work activities. Up and about more than 50% of waking hours)  3 - Symptomatic, >50% in bed, but not bedbound (Capable of only limited self-care, confined to bed or chair 50% or more of waking hours)  4 - Bedbound (Completely disabled. Cannot carry on any self-care. Totally confined to bed or chair)  5 - Death   Eustace Pen MM, Creech RH, Tormey DC, et al. [phone]). "Toxicity and response criteria of the [facility]".  Fries Oncol. 5 (6): 649-55  General: Well-developed, in no acute distress HEENT: Normocephalic, atraumatic; oral cavity clear Neck: Status post fine-needle aspiration involving the right supraclavicular region. I do not appreciate cervical lymphadenopathy at higher lymph node stations. Cardiovascular: Regular rate and rhythm Respiratory: The patient has some small degree wheezing on the right. Otherwise clear GI: Soft, nontender, normal bowel sounds Extremities: No edema present Neuro: No focal deficits     LABORATORY DATA:  Lab Results  Component Value Date   WBC 8.6 09/09/2014   HGB 13.9 09/09/2014   HCT 41.3 09/09/2014   MCV 93.2 09/09/2014   PLT 305 09/09/2014   Lab Results  Component  Value Date   NA 134* 09/09/2014   K 4.0 09/09/2014   CL 100 08/27/2014   CO2 27 09/09/2014   Lab Results  Component Value Date   ALT 13 09/09/2014   AST 16 09/09/2014   ALKPHOS 83 09/09/2014   BILITOT 0.31 09/09/2014      RADIOGRAPHY: Dg Chest 2 View  08/27/2014   CLINICAL DATA:  74 year old male status post bronchoscopy with endobronchial ultrasound. Mediastinal lymphadenopathy and COPD. Initial encounter.  EXAM: CHEST  2 VIEW  COMPARISON:  PET-CT 08/10/2014 and earlier.  FINDINGS: PA and lateral views of the chest. Stable lung volumes. No pneumothorax or plural effusion identified. Postoperative changes in the right lung. Stable cardiac size and mediastinal contours. Patchy right peripheral and lung base opacity appears stable. Chronic right apical scarring is stable. No acute pulmonary opacity. Osteopenia. No acute osseous abnormality identified.  IMPRESSION: Stable chest, no acute findings identified after bronchoscopy and biopsy.   Electronically Signed   By: Genevie Ann M.D.   On: 08/27/2014 11:07   Dg Chest Port 1 View  08/27/2014   CLINICAL DATA:  Shortness of breath  EXAM: PORTABLE CHEST - 1 VIEW  COMPARISON:  08/27/2014  FINDINGS: Interval increase in diffuse prominence of the interstitial markings superimposed on emphysema is noted. Increased right basilar patchy airspace opacity is identified. No pleural effusion. Heart size is normal.  IMPRESSION: Increased interstitial markings superimposed on COPD which could represent edema but is nonspecific.  Increased ill-defined patchy right lower lobe airspace opacity   Electronically Signed   By: Conchita Paris M.D.   On: 08/27/2014 15:24       IMPRESSION:  The patient has metastatic non-small cell lung cancer and is proceeding with palliative chemotherapy through Dr. Earlie Server in medical oncology. In late January, he was found to have a 2 mm lesion in the right parietal lesion consistent with metastasis. This was initially observed.  However, the patient has complained of increasing headaches recently and I have been asked to see the patient for consideration of a course of radiosurgery.  The patient I believe is a good candidate for radiosurgery. I discussed with the patient proceeding with a CT simulation scan in our department as well as a radiosurgery protocol brain MRI scan which also could be completed in the near future. The patient and his wife would prefer to have this done next Wednesday when he is coming to [facility] for his next chemotherapy.  We discussed the rationale of such a treatment. We also discussed the possible side effects and risks of treatment as well. All of his questions were answered.   PLAN: The patient will proceed with CT simulation and a repeat radiosurgery protocol MRI scan of the brain in the near future. He is a good candidate for one fraction of radiosurgery  to the solitary intracranial metastasis. I discussed with the patient that his upcoming MRI scan would help to rule out additional disease which has emerged since his last scan.      ________________________________   Jodelle Gross, MD, PhD   **Disclaimer: This note was dictated with voice recognition software. Similar sounding words can inadvertently be transcribed and this note may contain transcription errors which may not have been corrected upon publication of note.**

## 2014-09-12 ENCOUNTER — Other Ambulatory Visit: Payer: Self-pay | Admitting: Radiation Therapy

## 2014-09-12 ENCOUNTER — Telehealth: Payer: Self-pay

## 2014-09-12 DIAGNOSIS — C7931 Secondary malignant neoplasm of brain: Secondary | ICD-10-CM

## 2014-09-12 NOTE — Telephone Encounter (Signed)
Called to follow up with pt regarding his SOB/ Wheezing while in tx yesterday. Pt had breathing tx while in infusion that helped. Pt states he did another one last night and hasnt had any problems this morning. Pt appreciative of call and was informed to call with and questions or concerns. Pt verbalized understanding.

## 2014-09-16 ENCOUNTER — Telehealth: Payer: Self-pay | Admitting: Interventional Cardiology

## 2014-09-16 ENCOUNTER — Telehealth: Payer: Self-pay | Admitting: Medical Oncology

## 2014-09-16 NOTE — Telephone Encounter (Signed)
Attempted to call the patient's wife- no answer- no voice mail.  Will call back.

## 2014-09-16 NOTE — Telephone Encounter (Signed)
New Message  Pt wife called states that the pt is a Cancer pt. She reports that he has bad coughing spells and it sends his heart rate up to 125. She says that It seems like his heart rate is hanging around 112 while laying around. Requests a call back to determine if other medications are needed/or is the coughing normal/Please call back to discuss further//sr

## 2014-09-16 NOTE — Telephone Encounter (Signed)
I returned to call and confirmed appts.

## 2014-09-17 ENCOUNTER — Ambulatory Visit
Admission: RE | Admit: 2014-09-17 | Discharge: 2014-09-17 | Disposition: A | Discharge status: Home / Self Care | Payer: Medicare Other | Source: Ambulatory Visit | Attending: Radiation Oncology | Admitting: Radiation Oncology

## 2014-09-17 DIAGNOSIS — C7931 Secondary malignant neoplasm of brain: Secondary | ICD-10-CM

## 2014-09-17 MED ORDER — GADOBENATE DIMEGLUMINE 529 MG/ML IV SOLN
16.0000 mL | Freq: Once | INTRAVENOUS | Status: AC | PRN
  Administered 2014-09-17: 16 mL via INTRAVENOUS

## 2014-09-17 NOTE — Telephone Encounter (Signed)
Ok to try metoprolol 12.5 mg PO BID prn HR > 110

## 2014-09-17 NOTE — Telephone Encounter (Signed)
I attempted to call the patient's wife back. No answer. Will try in the AM.

## 2014-09-17 NOTE — Telephone Encounter (Signed)
I called and spoke with the patient's wife. She reports that he is going through chemo (IV) for cancer in his brain and both lungs.  His first treatment was last Wednesday x 2 hours. He will have his second treatment this Wednesday x 30 min. He will be on chemo for 6 months. Per the wife's concern, she has noticed that his heart rates have been going up for a "good while."  The patient will develop coughing spells (productive). She noticed yesterday he had a coughing spell and his HR went up to 125 bpm, but for the majority of yesterday he was 112 bpm. This morning his HR was 107/ BP- 140/85. His SBP will run from the 140-170's. The patient has also started having bad headaches that is relieved temporarily with hydrocodone. He is going today to have a follow up MRI of the brain to make sure his cancer has not progressed and causing the headaches. He is on oxygen therapy at home. He will walk 20 ft per his wife, and get SOB. He is ok with his breathing at rest, unless he starts coughing. Per the patient's wife, the patient reports feeling like someone is choking him with his coughing spells. I advised the patient's wife that his elevated HR's can be aggrevated by coughing. They may also be aggrevated by his anxiety with the current situation. She states he feels like he is going to die. He has also stopped smoking in the last 3 weeks. He has had one round of chemo. His last CBC was checked on 09/09/14 and his RBC's/ HGB/ Platelets were within normal limits. I did review the patient's medication list with his wife. He is not currently on any medications for HR control. He does take imdur and he is on clonazepam for anxiety. I advised I would forward the message to Dr. Irish Lack for review and recommendations to see if he felt there was anything the patient needed for HR control daily/ PRN. I will call her back with recommendations. He does use [employer]. She is agreeable.

## 2014-09-18 ENCOUNTER — Other Ambulatory Visit (HOSPITAL_BASED_OUTPATIENT_CLINIC_OR_DEPARTMENT_OTHER): Payer: Medicare Other

## 2014-09-18 ENCOUNTER — Other Ambulatory Visit: Payer: Self-pay | Admitting: Internal Medicine

## 2014-09-18 ENCOUNTER — Ambulatory Visit
Admission: RE | Admit: 2014-09-18 | Discharge: 2014-09-18 | Disposition: A | Discharge status: Home / Self Care | Payer: Medicare Other | Source: Ambulatory Visit | Attending: Radiation Oncology | Admitting: Radiation Oncology

## 2014-09-18 ENCOUNTER — Ambulatory Visit (HOSPITAL_BASED_OUTPATIENT_CLINIC_OR_DEPARTMENT_OTHER): Payer: Medicare Other

## 2014-09-18 VITALS — BP 152/92 | HR 97 | Temp 98.1°F

## 2014-09-18 DIAGNOSIS — C3411 Malignant neoplasm of upper lobe, right bronchus or lung: Secondary | ICD-10-CM

## 2014-09-18 DIAGNOSIS — C3490 Malignant neoplasm of unspecified part of unspecified bronchus or lung: Secondary | ICD-10-CM

## 2014-09-18 DIAGNOSIS — C3491 Malignant neoplasm of unspecified part of right bronchus or lung: Secondary | ICD-10-CM | POA: Diagnosis not present

## 2014-09-18 DIAGNOSIS — Z5111 Encounter for antineoplastic chemotherapy: Secondary | ICD-10-CM | POA: Diagnosis not present

## 2014-09-18 DIAGNOSIS — D701 Agranulocytosis secondary to cancer chemotherapy: Secondary | ICD-10-CM | POA: Insufficient documentation

## 2014-09-18 DIAGNOSIS — T451X5A Adverse effect of antineoplastic and immunosuppressive drugs, initial encounter: Principal | ICD-10-CM

## 2014-09-18 DIAGNOSIS — C7931 Secondary malignant neoplasm of brain: Secondary | ICD-10-CM

## 2014-09-18 LAB — CBC WITH DIFFERENTIAL/PLATELET
BASO%: 1.8 % (ref 0.0–2.0)
BASOS ABS: 0 10*3/uL (ref 0.0–0.1)
EOS%: 1.3 % (ref 0.0–7.0)
Eosinophils Absolute: 0 10*3/uL (ref 0.0–0.5)
HCT: 40.2 % (ref 38.4–49.9)
HGB: 13 g/dL (ref 13.0–17.1)
LYMPH%: 57.5 % — AB (ref 14.0–49.0)
MCH: 30.3 pg (ref 27.2–33.4)
MCHC: 32.3 g/dL (ref 32.0–36.0)
MCV: 93.7 fL (ref 79.3–98.0)
MONO#: 0.1 10*3/uL (ref 0.1–0.9)
MONO%: 4.4 % (ref 0.0–14.0)
NEUT#: 0.9 10*3/uL — ABNORMAL LOW (ref 1.5–6.5)
NEUT%: 35 % — AB (ref 39.0–75.0)
Platelets: 229 10*3/uL (ref 140–400)
RBC: 4.3 10*6/uL (ref 4.20–5.82)
RDW: 12.9 % (ref 11.0–14.6)
WBC: 2.5 10*3/uL — ABNORMAL LOW (ref 4.0–10.3)
lymph#: 1.4 10*3/uL (ref 0.9–3.3)

## 2014-09-18 LAB — COMPREHENSIVE METABOLIC PANEL (CC13)
ALT: 34 U/L (ref 0–55)
AST: 23 U/L (ref 5–34)
Albumin: 3.1 g/dL — ABNORMAL LOW (ref 3.5–5.0)
Alkaline Phosphatase: 84 U/L (ref 40–150)
Anion Gap: 10 mEq/L (ref 3–11)
BILIRUBIN TOTAL: 0.23 mg/dL (ref 0.20–1.20)
BUN: 7.3 mg/dL (ref 7.0–26.0)
CO2: 27 mEq/L (ref 22–29)
CREATININE: 0.7 mg/dL (ref 0.7–1.3)
Calcium: 9.5 mg/dL (ref 8.4–10.4)
Chloride: 99 mEq/L (ref 98–109)
Glucose: 94 mg/dl (ref 70–140)
Potassium: 3.9 mEq/L (ref 3.5–5.1)
SODIUM: 136 meq/L (ref 136–145)
TOTAL PROTEIN: 6.7 g/dL (ref 6.4–8.3)

## 2014-09-18 MED ORDER — PROCHLORPERAZINE MALEATE 10 MG PO TABS
10.0000 mg | ORAL_TABLET | Freq: Once | ORAL | Status: AC
  Administered 2014-09-18: 10 mg via ORAL

## 2014-09-18 MED ORDER — SODIUM CHLORIDE 0.9 % IV SOLN
1000.0000 mg/m2 | Freq: Once | INTRAVENOUS | Status: AC
  Administered 2014-09-18: 1976 mg via INTRAVENOUS
  Filled 2014-09-18: qty 51.97

## 2014-09-18 MED ORDER — PROCHLORPERAZINE MALEATE 10 MG PO TABS
ORAL_TABLET | ORAL | Status: AC
  Filled 2014-09-18: qty 1

## 2014-09-18 MED ORDER — SODIUM CHLORIDE 0.9 % IV SOLN
Freq: Once | INTRAVENOUS | Status: AC
  Administered 2014-09-18: 09:00:00 via INTRAVENOUS

## 2014-09-18 MED ORDER — METOPROLOL TARTRATE 25 MG PO TABS: ORAL_TABLET | ORAL | Status: DC

## 2014-09-18 NOTE — Progress Notes (Signed)
  Radiation Oncology         [phone] ________________________________  Name: [name] MRN: [id number]  Date: 09/18/2014  DOB: 1941-01-11  SIMULATION AND TREATMENT PLANNING NOTE  DIAGNOSIS:  Metastatic lung cancer  NARRATIVE:  The patient was brought to the [place] suite.  Identity was confirmed.  All relevant records and images related to the planned course of therapy were reviewed.  The patient freely provided informed written consent to proceed with treatment after reviewing the details related to the planned course of therapy. The consent form was witnessed and verified by the simulation staff. Intravenous access was established for contrast administration. Then, the patient was set-up in a stable reproducible supine position for radiation therapy.  A relocatable thermoplastic stereotactic head frame was fabricated for precise immobilization.  CT images were obtained.  Surface markings were placed.  The CT images were loaded into the planning software and fused with the patient's targeting MRI scan.  Then the target and avoidance structures were contoured.  Treatment planning then occurred.  The radiation prescription was entered and confirmed.  I have requested 3D planning  I have requested a DVH of the following structures: Brain stem, brain, left eye, right I, lenses, optic chiasm, target volumes, uninvolved brain, and normal tissue.    PLAN:  The patient will receive 20 Gy in 1 fraction.  ________________________________  Jodelle Gross, MD, PhD

## 2014-09-18 NOTE — Patient Instructions (Signed)
[name] Discharge Instructions for Patients Receiving Chemotherapy  Today you received the following chemotherapy agents: Gemzar. To help prevent nausea and vomiting after your treatment, we encourage you to take your nausea medication: Phenergan 25mg  every 6 hours as needeed.   If you develop nausea and vomiting that is not controlled by your nausea medication, call the clinic.   BELOW ARE SYMPTOMS THAT SHOULD BE REPORTED IMMEDIATELY:  *FEVER GREATER THAN 100.5 F  *CHILLS WITH OR WITHOUT FEVER  NAUSEA AND VOMITING THAT IS NOT CONTROLLED WITH YOUR NAUSEA MEDICATION  *UNUSUAL SHORTNESS OF BREATH  *UNUSUAL BRUISING OR BLEEDING  TENDERNESS IN MOUTH AND THROAT WITH OR WITHOUT PRESENCE OF ULCERS  *URINARY PROBLEMS  *BOWEL PROBLEMS  UNUSUAL RASH Items with * indicate a potential emergency and should be followed up as soon as possible.  Feel free to call the clinic you have any questions or concerns. The clinic phone number is [phone].

## 2014-09-18 NOTE — Progress Notes (Signed)
Reviewed labs. Noted WBC to be 2.5 and ANC 0.9.  Reported to [name] and he stated it is ok to treat today and to set up appointment tomorrow for Neulasta.

## 2014-09-18 NOTE — Telephone Encounter (Signed)
I spoke with the patient's wife. She is aware of Dr. Hassell Done recommendations. I will send his RX to [facility]

## 2014-09-18 NOTE — Progress Notes (Signed)
Removed left wrist IV. Catheter intact upon removal. Applied an occlusive dressing to old IV site. Patient tolerated well.

## 2014-09-19 ENCOUNTER — Ambulatory Visit (HOSPITAL_BASED_OUTPATIENT_CLINIC_OR_DEPARTMENT_OTHER): Payer: Medicare Other

## 2014-09-19 DIAGNOSIS — Z5189 Encounter for other specified aftercare: Secondary | ICD-10-CM | POA: Diagnosis not present

## 2014-09-19 DIAGNOSIS — C3411 Malignant neoplasm of upper lobe, right bronchus or lung: Secondary | ICD-10-CM

## 2014-09-19 DIAGNOSIS — D701 Agranulocytosis secondary to cancer chemotherapy: Secondary | ICD-10-CM

## 2014-09-19 DIAGNOSIS — T451X5A Adverse effect of antineoplastic and immunosuppressive drugs, initial encounter: Principal | ICD-10-CM

## 2014-09-19 MED ORDER — PEGFILGRASTIM INJECTION 6 MG/0.6ML
6.0000 mg | Freq: Once | SUBCUTANEOUS | Status: AC
  Administered 2014-09-19: 6 mg via SUBCUTANEOUS
  Filled 2014-09-19: qty 0.6

## 2014-09-23 ENCOUNTER — Ambulatory Visit: Payer: Medicare Other

## 2014-09-23 DIAGNOSIS — C3491 Malignant neoplasm of unspecified part of right bronchus or lung: Secondary | ICD-10-CM | POA: Diagnosis not present

## 2014-09-25 ENCOUNTER — Ambulatory Visit
Admission: RE | Admit: 2014-09-25 | Discharge: 2014-09-25 | Disposition: A | Discharge status: Home / Self Care | Payer: Medicare Other | Source: Ambulatory Visit | Attending: Radiation Oncology | Admitting: Radiation Oncology

## 2014-09-25 ENCOUNTER — Other Ambulatory Visit (HOSPITAL_BASED_OUTPATIENT_CLINIC_OR_DEPARTMENT_OTHER): Payer: Medicare Other

## 2014-09-25 ENCOUNTER — Other Ambulatory Visit: Payer: Medicare Other

## 2014-09-25 ENCOUNTER — Ambulatory Visit: Payer: Medicare Other | Admitting: Interventional Cardiology

## 2014-09-25 ENCOUNTER — Encounter: Payer: Self-pay | Admitting: Radiation Oncology

## 2014-09-25 VITALS — BP 135/63 | HR 89 | Temp 98.3°F | Resp 20

## 2014-09-25 DIAGNOSIS — Z923 Personal history of irradiation: Secondary | ICD-10-CM

## 2014-09-25 DIAGNOSIS — C3491 Malignant neoplasm of unspecified part of right bronchus or lung: Secondary | ICD-10-CM | POA: Diagnosis not present

## 2014-09-25 DIAGNOSIS — C3411 Malignant neoplasm of upper lobe, right bronchus or lung: Secondary | ICD-10-CM | POA: Diagnosis not present

## 2014-09-25 DIAGNOSIS — C7931 Secondary malignant neoplasm of brain: Secondary | ICD-10-CM

## 2014-09-25 HISTORY — PX: OTHER SURGICAL HISTORY: SHX169

## 2014-09-25 HISTORY — DX: Personal history of irradiation: Z92.3

## 2014-09-25 LAB — COMPREHENSIVE METABOLIC PANEL (CC13)
ALT: 103 U/L — AB (ref 0–55)
ANION GAP: 7 meq/L (ref 3–11)
AST: 45 U/L — ABNORMAL HIGH (ref 5–34)
Albumin: 3.1 g/dL — ABNORMAL LOW (ref 3.5–5.0)
Alkaline Phosphatase: 108 U/L (ref 40–150)
BUN: 5.1 mg/dL — ABNORMAL LOW (ref 7.0–26.0)
CALCIUM: 9 mg/dL (ref 8.4–10.4)
CHLORIDE: 98 meq/L (ref 98–109)
CO2: 30 mEq/L — ABNORMAL HIGH (ref 22–29)
Creatinine: 0.7 mg/dL (ref 0.7–1.3)
EGFR: >90 mL/min/{1.73_m2} (ref >90)
Glucose: 97 mg/dl (ref 70–140)
Potassium: 3.6 mEq/L (ref 3.5–5.1)
Sodium: 135 mEq/L — ABNORMAL LOW (ref 136–145)
Total Bilirubin: 0.29 mg/dL (ref 0.20–1.20)
Total Protein: 6.2 g/dL — ABNORMAL LOW (ref 6.4–8.3)

## 2014-09-25 LAB — CBC WITH DIFFERENTIAL/PLATELET
BASO%: 0.4 % (ref 0.0–2.0)
BASOS ABS: 0 10*3/uL (ref 0.0–0.1)
EOS%: 0.2 % (ref 0.0–7.0)
Eosinophils Absolute: 0 10*3/uL (ref 0.0–0.5)
HEMATOCRIT: 35.2 % — AB (ref 38.4–49.9)
HEMOGLOBIN: 11.4 g/dL — AB (ref 13.0–17.1)
LYMPH%: 13.3 % — ABNORMAL LOW (ref 14.0–49.0)
MCH: 30.1 pg (ref 27.2–33.4)
MCHC: 32.3 g/dL (ref 32.0–36.0)
MCV: 93.2 fL (ref 79.3–98.0)
MONO#: 0.3 10*3/uL (ref 0.1–0.9)
MONO%: 2.9 % (ref 0.0–14.0)
NEUT%: 83.2 % — AB (ref 39.0–75.0)
NEUTROS ABS: 8.5 10*3/uL — AB (ref 1.5–6.5)
PLATELETS: 47 10*3/uL — AB (ref 140–400)
RBC: 3.78 10*6/uL — ABNORMAL LOW (ref 4.20–5.82)
RDW: 13 % (ref 11.0–14.6)
WBC: 10.2 10*3/uL (ref 4.0–10.3)
lymph#: 1.4 10*3/uL (ref 0.9–3.3)

## 2014-09-25 MED ORDER — METHYLPREDNISOLONE (PAK) 4 MG PO TABS: ORAL_TABLET | ORAL | Status: DC

## 2014-09-25 NOTE — Progress Notes (Signed)
  Radiation Oncology         [phone] ________________________________  Name: [name] MRN: [id number]  Date: 09/25/2014  DOB: 1941-01-21   SPECIAL TREATMENT PROCEDURE   3D TREATMENT PLANNING AND DOSIMETRY: [name] radiation plan was reviewed and approved by Dr. Saintclair Halsted from neurosurgery and radiation oncology prior to treatment. It showed 3-dimensional radiation distributions overlaid onto the planning CT/MRI image set. The [facility] for the target structures as well as the organs at risk were reviewed. The documentation of the 3D plan and dosimetry are filed in the radiation oncology EMR.   NARRATIVE: The patient was brought to the TrueBeam stereotactic radiation treatment machine and placed supine on the CT couch. The head frame was applied, and the patient was set up for stereotactic radiosurgery. Neurosurgery was present for the set-up and delivery   SIMULATION VERIFICATION: In the couch zero-angle position, the patient underwent Exactrac imaging using the Brainlab system with orthogonal KV images. These were carefully aligned and repeated to confirm treatment position for each of the isocenters. The Exactrac snap film verification was repeated at each couch angle.   SPECIAL TREATMENT PROCEDURE: The patient received stereotactic radiosurgery to the following targets:  PTV1 Rt Parietal 60mm target was treated using 3 Arcs to a prescription dose of 20 Gy. ExacTrac Snap verification was performed for each couch angle.   STEREOTACTIC TREATMENT MANAGEMENT: Following delivery, the patient was transported to nursing in stable condition and monitored for possible acute effects. Vital signs were recorded . The patient tolerated treatment without significant acute effects, and was discharged to home in stable condition.  PLAN: Follow-up in one month.   ------------------------------------------------  Jodelle Gross, MD, PhD

## 2014-09-25 NOTE — Progress Notes (Signed)
  Radiation Oncology         [phone] ________________________________  Name: [name] MRN: [id number]  Date: 09/25/2014  DOB: Feb 20, 1941  End of Treatment Note  Diagnosis:   Metastatic lung cancer with brain metastasis     Indication for treatment:  palliative       Radiation treatment dates:   09/25/2014  Site/dose:    The solitary 2 mm right parietal lesion was treated using a course of radiosurgery to a dose of 20 gray in 1 fractions. This consisted of 3 arcs.  Narrative: The patient tolerated radiation treatment well.   There were no signs of acute toxicity after treatment.  The patient has had some continued headaches. The lesion in question is too small to probably contribute to that. I have discussed this with neurosurgery and we have written a prescription for a short course of steroids to see if this helps to alleviate this symptom. There is not any sign of meningeal disease on his MRI scans but this is something to possibly continue to follow.  Plan: The patient has completed radiation treatment. The patient will return to radiation oncology clinic for routine followup in one month. I advised the patient to call or return sooner if they have any questions or concerns related to their recovery or treatment. ________________________________  Jodelle Gross, M.D., Ph.D.

## 2014-09-25 NOTE — Progress Notes (Signed)
2nd set vitals taken, wnl , 97% room air, no changes, d/c patient home ,will pick up rx  11:32 AM

## 2014-09-25 NOTE — Progress Notes (Signed)
S/p SRS tx x 1 brain, no c/o pain or nausea or light headed, appetite good, still has head aches,takes vicodin, vitals good, mD will write medrodose pack for patient to be sent to [place] pharmacy,patient to be monitored 30 minutes then can go home 11:21 AM

## 2014-09-27 ENCOUNTER — Encounter: Payer: Self-pay | Admitting: Radiation Therapy

## 2014-09-27 ENCOUNTER — Encounter: Payer: Self-pay | Admitting: Radiation Oncology

## 2014-09-27 ENCOUNTER — Encounter: Payer: Self-pay | Admitting: *Deleted

## 2014-09-27 NOTE — Addendum Note (Signed)
Encounter addended by: Kary Kos, MD on: 09/27/2014 10:36 AM<BR>     Documentation filed: Notes Section

## 2014-09-27 NOTE — Progress Notes (Signed)
During my [place] follow-up call with [name], he mentioned that he is out of pain medication.  I verified the dose with his wife and she said that he has been taking 2 tablets at a time instead of just 1 as written. They would like a refill with an adjustment to the Rx to reflect taking 2 instead of 1 if possible.   Current Rx: Hydrocodone-acetaminophen (NORCO) 7.5 -325 MG  Take 1 tablet by mouth every 4 hours as needed for pain.   I sent this message to [name] and [name] on 3/18 via email.     [name] R.T. (R) (T) Radiation Special Procedures [place] [phone] Office [phone] Pager [phone] Fax [name]

## 2014-09-27 NOTE — CV Procedure (Signed)
  Name: [name]  MRN: [id number]  Date: 09/25/2014   DOB: 1941-04-04  Stereotactic Radiosurgery Operative Note  PRE-OPERATIVE DIAGNOSIS:  Solitary Brain Metastasis  POST-OPERATIVE DIAGNOSIS:  Solitary Brain Metastasis  PROCEDURE:  Stereotactic Radiosurgery  SURGEON:  Mariea Mcmartin P, MD  NARRATIVE: The patient underwent a radiation treatment planning session in the radiation oncology simulation suite under the care of the radiation oncology physician and physicist.  I participated closely in the radiation treatment planning afterwards. The patient underwent planning CT which was fused to 3T high resolution MRI with 1 mm axial slices.  These images were fused on the planning system.  We contoured the gross target volumes and subsequently expanded this to yield the Planning Target Volume. I actively participated in the planning process.  I helped to define and review the target contours and also the contours of the optic pathway, eyes, brainstem and selected nearby organs at risk.  All the dose constraints for critical structures were reviewed and compared to AAPM Task Group 101.  The prescription dose conformity was reviewed.  I approved the plan electronically.    Accordingly, [name] was brought to the TrueBeam stereotactic radiation treatment linac and placed in the custom immobilization mask.  The patient was aligned according to the IR fiducial markers with BrainLab Exactrac, then orthogonal x-rays were used in ExacTrac with the 6DOF robotic table and the shifts were made to align the patient  [name] received stereotactic radiosurgery uneventfully.    The detailed description of the procedure is recorded in the radiation oncology procedure note.  I was present for the duration of the procedure.  DISPOSITION:  Following delivery, the patient was transported to nursing in stable condition and monitored for possible acute effects to be discharged to home in stable condition  with follow-up in one month.  Markese Bloxham P, MD 09/27/2014 10:36 AM

## 2014-09-27 NOTE — [facility] Oncology Navigator Note (Unsigned)
I forward message from [place] to [name] on 09/27/14

## 2014-09-30 ENCOUNTER — Emergency Department (HOSPITAL_BASED_OUTPATIENT_CLINIC_OR_DEPARTMENT_OTHER)
Admission: EM | Admit: 2014-09-30 | Discharge: 2014-09-30 | Disposition: A | Discharge status: Home / Self Care | Payer: Medicare Other | Attending: Emergency Medicine | Admitting: Emergency Medicine

## 2014-09-30 ENCOUNTER — Emergency Department (HOSPITAL_BASED_OUTPATIENT_CLINIC_OR_DEPARTMENT_OTHER): Payer: Medicare Other

## 2014-09-30 ENCOUNTER — Telehealth: Payer: Self-pay | Admitting: *Deleted

## 2014-09-30 ENCOUNTER — Encounter (HOSPITAL_BASED_OUTPATIENT_CLINIC_OR_DEPARTMENT_OTHER): Payer: Self-pay | Admitting: *Deleted

## 2014-09-30 ENCOUNTER — Other Ambulatory Visit: Payer: Self-pay | Admitting: Medical Oncology

## 2014-09-30 DIAGNOSIS — J449 Chronic obstructive pulmonary disease, unspecified: Secondary | ICD-10-CM | POA: Insufficient documentation

## 2014-09-30 DIAGNOSIS — Z85118 Personal history of other malignant neoplasm of bronchus and lung: Secondary | ICD-10-CM | POA: Diagnosis not present

## 2014-09-30 DIAGNOSIS — Z9981 Dependence on supplemental oxygen: Secondary | ICD-10-CM | POA: Insufficient documentation

## 2014-09-30 DIAGNOSIS — F419 Anxiety disorder, unspecified: Secondary | ICD-10-CM | POA: Insufficient documentation

## 2014-09-30 DIAGNOSIS — Z8701 Personal history of pneumonia (recurrent): Secondary | ICD-10-CM | POA: Diagnosis not present

## 2014-09-30 DIAGNOSIS — K219 Gastro-esophageal reflux disease without esophagitis: Secondary | ICD-10-CM | POA: Diagnosis not present

## 2014-09-30 DIAGNOSIS — A499 Bacterial infection, unspecified: Secondary | ICD-10-CM

## 2014-09-30 DIAGNOSIS — I10 Essential (primary) hypertension: Secondary | ICD-10-CM | POA: Insufficient documentation

## 2014-09-30 DIAGNOSIS — Z8669 Personal history of other diseases of the nervous system and sense organs: Secondary | ICD-10-CM | POA: Diagnosis not present

## 2014-09-30 DIAGNOSIS — R109 Unspecified abdominal pain: Secondary | ICD-10-CM

## 2014-09-30 DIAGNOSIS — M1990 Unspecified osteoarthritis, unspecified site: Secondary | ICD-10-CM | POA: Diagnosis not present

## 2014-09-30 DIAGNOSIS — Z7982 Long term (current) use of aspirin: Secondary | ICD-10-CM | POA: Insufficient documentation

## 2014-09-30 DIAGNOSIS — Z87891 Personal history of nicotine dependence: Secondary | ICD-10-CM | POA: Insufficient documentation

## 2014-09-30 DIAGNOSIS — Z79899 Other long term (current) drug therapy: Secondary | ICD-10-CM | POA: Diagnosis not present

## 2014-09-30 DIAGNOSIS — N390 Urinary tract infection, site not specified: Secondary | ICD-10-CM | POA: Diagnosis not present

## 2014-09-30 LAB — CBC WITH DIFFERENTIAL/PLATELET
BASOS PCT: 0 % (ref 0–1)
Band Neutrophils: 17 % — ABNORMAL HIGH (ref 0–10)
Basophils Absolute: 0 10*3/uL (ref 0.0–0.1)
Blasts: 0 %
Eosinophils Absolute: 0 10*3/uL (ref 0.0–0.7)
Eosinophils Relative: 0 % (ref 0–5)
HEMATOCRIT: 35 % — AB (ref 39.0–52.0)
Hemoglobin: 11.4 g/dL — ABNORMAL LOW (ref 13.0–17.0)
Lymphocytes Relative: 17 % (ref 12–46)
Lymphs Abs: 6.6 10*3/uL — ABNORMAL HIGH (ref 0.7–4.0)
MCH: 30.9 pg (ref 26.0–34.0)
MCHC: 32.6 g/dL (ref 30.0–36.0)
MCV: 94.9 fL (ref 78.0–100.0)
MONO ABS: 3.1 10*3/uL — AB (ref 0.1–1.0)
Metamyelocytes Relative: 7 %
Monocytes Relative: 8 % (ref 3–12)
Myelocytes: 6 %
Neutro Abs: 29.1 10*3/uL — ABNORMAL HIGH (ref 1.7–7.7)
Neutrophils Relative %: 45 % (ref 43–77)
Platelets: 201 10*3/uL (ref 150–400)
Promyelocytes Absolute: 0 %
RBC: 3.69 MIL/uL — ABNORMAL LOW (ref 4.22–5.81)
RDW: 13.2 % (ref 11.5–15.5)
WBC: 38.8 10*3/uL — ABNORMAL HIGH (ref 4.0–10.5)
nRBC: 0 /100 WBC

## 2014-09-30 LAB — COMPREHENSIVE METABOLIC PANEL
ALK PHOS: 124 U/L — AB (ref 39–117)
ALT: 51 U/L (ref 0–53)
ANION GAP: 11 (ref 5–15)
AST: 30 U/L (ref 0–37)
Albumin: 3.4 g/dL — ABNORMAL LOW (ref 3.5–5.2)
BILIRUBIN TOTAL: 0.2 mg/dL — AB (ref 0.3–1.2)
BUN: 8 mg/dL (ref 6–23)
CHLORIDE: 95 mmol/L — AB (ref 96–112)
CO2: 31 mmol/L (ref 19–32)
Calcium: 8.3 mg/dL — ABNORMAL LOW (ref 8.4–10.5)
Creatinine, Ser: 0.8 mg/dL (ref 0.50–1.35)
GFR, EST NON AFRICAN AMERICAN: 88 mL/min — AB (ref >90)
Glucose, Bld: 100 mg/dL — ABNORMAL HIGH (ref 70–99)
Potassium: 3.2 mmol/L — ABNORMAL LOW (ref 3.5–5.1)
Sodium: 137 mmol/L (ref 135–145)
Total Protein: 6.6 g/dL (ref 6.0–8.3)

## 2014-09-30 LAB — URINALYSIS, ROUTINE W REFLEX MICROSCOPIC
Bilirubin Urine: NEGATIVE
Glucose, UA: NEGATIVE mg/dL
HGB URINE DIPSTICK: NEGATIVE
Ketones, ur: NEGATIVE mg/dL
Leukocytes, UA: NEGATIVE
NITRITE: NEGATIVE
Protein, ur: NEGATIVE mg/dL
SPECIFIC GRAVITY, URINE: 1.019 (ref 1.005–1.030)
UROBILINOGEN UA: 0.2 mg/dL (ref 0.0–1.0)
pH: 6.5 (ref 5.0–8.0)

## 2014-09-30 LAB — LIPASE, BLOOD: Lipase: 25 U/L (ref 11–59)

## 2014-09-30 MED ORDER — LEVOFLOXACIN 750 MG PO TABS: 750.0000 mg | ORAL_TABLET | Freq: Every day | ORAL | Status: DC

## 2014-09-30 MED ORDER — MORPHINE SULFATE 4 MG/ML IJ SOLN
4.0000 mg | Freq: Once | INTRAMUSCULAR | Status: AC
  Administered 2014-09-30: 4 mg via INTRAVENOUS
  Filled 2014-09-30: qty 1

## 2014-09-30 MED ORDER — LEVOFLOXACIN IN D5W 750 MG/150ML IV SOLN
750.0000 mg | Freq: Once | INTRAVENOUS | Status: AC
  Administered 2014-09-30: 750 mg via INTRAVENOUS
  Filled 2014-09-30: qty 150

## 2014-09-30 MED ORDER — OXYCODONE HCL 10 MG PO TABS: 10.0000 mg | ORAL_TABLET | ORAL | Status: DC | PRN

## 2014-09-30 MED ORDER — ONDANSETRON HCL 4 MG/2ML IJ SOLN
4.0000 mg | Freq: Once | INTRAMUSCULAR | Status: AC
  Administered 2014-09-30: 4 mg via INTRAVENOUS
  Filled 2014-09-30: qty 2

## 2014-09-30 NOTE — ED Notes (Signed)
Pt to xray/ CT via stretcher, family at [facility].

## 2014-09-30 NOTE — Discharge Instructions (Signed)
Levaquin as prescribed.  Oxycodone as prescribed as needed for pain.  Follow-up with your oncologist on Wednesday as scheduled, and return to the ER if your symptoms significantly worsen or change.   Urinary Tract Infection Urinary tract infections (UTIs) can develop anywhere along your urinary tract. Your urinary tract is your body's drainage system for removing wastes and extra water. Your urinary tract includes two kidneys, two ureters, a bladder, and a urethra. Your kidneys are a pair of bean-shaped organs. Each kidney is about the size of your fist. They are located below your ribs, one on each side of your spine. CAUSES Infections are caused by microbes, which are microscopic organisms, including fungi, viruses, and bacteria. These organisms are so small that they can only be seen through a microscope. Bacteria are the microbes that most commonly cause UTIs. SYMPTOMS  Symptoms of UTIs may vary by age and gender of the patient and by the location of the infection. Symptoms in young women typically include a frequent and intense urge to urinate and a painful, burning feeling in the bladder or urethra during urination. Older women and men are more likely to be tired, shaky, and weak and have muscle aches and abdominal pain. A fever may mean the infection is in your kidneys. Other symptoms of a kidney infection include pain in your back or sides below the ribs, nausea, and vomiting. DIAGNOSIS To diagnose a UTI, your caregiver will ask you about your symptoms. Your caregiver also will ask to provide a urine sample. The urine sample will be tested for bacteria and white blood cells. White blood cells are made by your body to help fight infection. TREATMENT  Typically, UTIs can be treated with medication. Because most UTIs are caused by a bacterial infection, they usually can be treated with the use of antibiotics. The choice of antibiotic and length of treatment depend on your symptoms and the type  of bacteria causing your infection. HOME CARE INSTRUCTIONS  If you were prescribed antibiotics, take them exactly as your caregiver instructs you. Finish the medication even if you feel better after you have only taken some of the medication.  Drink enough water and fluids to keep your urine clear or pale yellow.  Avoid caffeine, tea, and carbonated beverages. They tend to irritate your bladder.  Empty your bladder often. Avoid holding urine for long periods of time.  Empty your bladder before and after sexual intercourse.  After a bowel movement, women should cleanse from front to back. Use each tissue only once. SEEK MEDICAL CARE IF:   You have back pain.  You develop a fever.  Your symptoms do not begin to resolve within 3 days. SEEK IMMEDIATE MEDICAL CARE IF:   You have severe back pain or lower abdominal pain.  You develop chills.  You have nausea or vomiting.  You have continued burning or discomfort with urination. MAKE SURE YOU:   Understand these instructions.  Will watch your condition.  Will get help right away if you are not doing well or get worse. Document Released: 04/07/2005 Document Revised: 12/28/2011 Document Reviewed: 08/06/2011 [facility] Patient Information 2015 [place], [place]. This information is not intended to replace advice given to you by your health care provider. Make sure you discuss any questions you have with your health care provider.

## 2014-09-30 NOTE — Progress Notes (Signed)
erroneous

## 2014-09-30 NOTE — ED Provider Notes (Signed)
CSN: [id number]     Arrival date & time 09/30/14  0055 History   First MD Initiated Contact with Patient 09/30/14 0127     Chief Complaint  Patient presents with  . Flank Pain     (Consider location/radiation/quality/duration/timing/severity/associated sxs/prior Treatment) HPI Comments: Patient is a 74 year old male with history of lung cancer currently receiving chemotherapy and radiation. He presents with a 2 day history of right flank pain that started suddenly in the absence of any injury or trauma. He denies any fevers or chills. He denies any dysuria but does report some frequency and "going injury". He denies any blood in his urine. He denies any vomiting or diarrhea. He has taken Percocet with no relief. He denies a history of kidney stones.  Patient is a 74 y.o. male presenting with flank pain. The history is provided by the patient.  Flank Pain This is a new problem. The current episode started 2 days ago. The problem occurs constantly. The problem has been gradually worsening. Associated symptoms include abdominal pain. Pertinent negatives include no chest pain. Nothing aggravates the symptoms. Nothing relieves the symptoms. He has tried nothing for the symptoms. The treatment provided no relief.    Past Medical History  Diagnosis Date  . Arthritis   . Wheezing   . Sore throat   . Carotid artery occlusion   . COPD (chronic obstructive pulmonary disease)   . Lumbar disc disease   . Restless leg syndrome   . Allergic rhinitis   . Hypertension   . Anxiety     anxiety  . Shortness of breath   . Lung mass   . GERD (gastroesophageal reflux disease)   . H/O hiatal hernia   . Pre-operative cardiovascular examination   . Nonspecific abnormal unspecified cardiovascular function study   . Peripheral vascular disease, unspecified   . Pneumonia 2014    ?   Marland Kitchen On home oxygen therapy     prn  . Constipation   . Cancer 09/14/12    invasive mod diff squamous cell ca   Past  Surgical History  Procedure Laterality Date  . Carotid endarterectomy  10/15/2010    left  . Hand surgery      repair of the left long, ring, and small finger after a saw injury  . Video bronchoscopy  07/24/2012    Procedure: VIDEO BRONCHOSCOPY WITH FLUORO;  Surgeon: Kathee Delton, MD;  Location: [facility];  Service: Cardiopulmonary;  Laterality: Bilateral;  . Lobectomy Right 09/14/2012    Procedure: LOBECTOMY;  Surgeon: Ivin Poot, MD;  Location: [place];  Service: Thoracic;  Laterality: Right;  RIGHT UPPER LOBECTOMY  . Video assisted thoracoscopy Right 09/14/2012    Procedure: VIDEO ASSISTED THORACOSCOPY;  Surgeon: Ivin Poot, MD;  Location: [place];  Service: Thoracic;  Laterality: Right;  . Video bronchoscopy with endobronchial ultrasound N/A 08/27/2014    Procedure: VIDEO BRONCHOSCOPY WITH ENDOBRONCHIAL ULTRASOUND;  Surgeon: Ivin Poot, MD;  Location: [facility] OR;  Service: Thoracic;  Laterality: N/A;  . Scalene node biopsy Right 08/27/2014    Procedure: BIOPSY SCALENE NODE;  Surgeon: Ivin Poot, MD;  Location: [facility] OR;  Service: Thoracic;  Laterality: Right;  . Sterotactic radiosurgery Right 09/25/14    right parietal 30Gy/1 fx   Family History  Problem Relation Age of Onset  . Heart disease Father     MI  . Heart attack Father   . Alcohol abuse Mother   . Stroke Brother   .  Heart disease Brother   . Depression Sister   . Heart attack Brother    History  Substance Use Topics  . Smoking status: Former Smoker -- 1.00 packs/day for 55 years    Types: Cigarettes    Quit date: 08/16/2014  . Smokeless tobacco: Former Systems developer    Quit date: 04/19/1952  . Alcohol Use: Yes     Comment: occianional- not since I ve sick.    Review of Systems  Cardiovascular: Negative for chest pain.  Gastrointestinal: Positive for abdominal pain.  Genitourinary: Positive for flank pain.  All other systems reviewed and are negative.     Allergies  Review of patient's allergies indicates no  known allergies.  Home Medications   Prior to Admission medications   Medication Sig Start Date End Date Taking? Authorizing Provider  aspirin EC 81 MG tablet Take 81 mg by mouth daily. Takes one tablet every evening.    Historical Provider, MD  clonazePAM (KLONOPIN) 0.5 MG tablet Take 0.5 mg by mouth 2 (two) times daily as needed for anxiety. Takes one tablet by mouth two times daily as needed for anxiety.    Historical Provider, MD  HYDROcodone-acetaminophen (NORCO) 7.5-325 MG per tablet Take 1 tablet by mouth every 4 (four) hours as needed for pain. 02/07/13   Curt Bears, MD  isosorbide mononitrate (IMDUR) 30 MG 24 hr tablet Take 1 tablet (30 mg total) by mouth daily. 03/07/14   Jettie Booze, MD  methylPREDNIsolone (MEDROL DOSPACK) 4 MG tablet follow package directions 09/25/14   Kyung Rudd, MD  metoprolol tartrate (LOPRESSOR) 25 MG tablet Take 1/2 tablet (12.5 mg) by mouth twice daily as needed for heart rates > 110 09/18/14   Jettie Booze, MD  omeprazole (PRILOSEC) 40 MG capsule Take 40 mg by mouth daily.    Historical Provider, MD  pramipexole (MIRAPEX) 1 MG tablet Take 1 mg by mouth at bedtime.    Historical Provider, MD  promethazine-codeine (PHENERGAN WITH CODEINE) 6.25-10 MG/5ML syrup Take 5 mLs by mouth every 6 (six) hours as needed for cough. 07/11/14   Deneise Lever, MD  zolpidem (AMBIEN) 10 MG tablet Take 10 mg by mouth at bedtime as needed for sleep.    Historical Provider, MD   BP 137/81 mmHg  Pulse 102  Temp(Src) 99 F (37.2 C) (Oral)  Resp 20  Ht 5' 9.5" (1.765 m)  Wt 172 lb (78.019 kg)  BMI 25.04 kg/m2  SpO2 92% Physical Exam  Constitutional: He is oriented to person, place, and time. He appears well-developed and well-nourished. No distress.  HENT:  Head: Normocephalic and atraumatic.  Neck: Normal range of motion. Neck supple.  Cardiovascular: Normal rate, regular rhythm and normal heart sounds.   No murmur heard. Pulmonary/Chest: Effort normal  and breath sounds normal. No respiratory distress. He has no wheezes.  Abdominal: Soft. Bowel sounds are normal. He exhibits no distension. There is no tenderness. There is no rebound and no guarding.  There is tenderness to palpation in the right flank.  Musculoskeletal: Normal range of motion. He exhibits no edema.  Neurological: He is alert and oriented to person, place, and time.  Skin: Skin is warm and dry. He is not diaphoretic.  Nursing note and vitals reviewed.   ED Course  Procedures (including critical care time) Labs Review Labs Reviewed  URINALYSIS, ROUTINE W REFLEX MICROSCOPIC  COMPREHENSIVE METABOLIC PANEL  LIPASE, BLOOD  CBC WITH DIFFERENTIAL/PLATELET    Imaging Review No results found.   EKG Interpretation  None      MDM   Final diagnoses:  None    Patient with history of metastatic lung cancer who presents with right flank pain. His workup reveals a markedly elevated white count of 39,000. He recently received Neulasta and also was recently started on prednisone. He is afebrile and otherwise is nontoxic appearing. His CT scan shows perinephric stranding around the right kidney, however urinalysis does not reveal an obvious infection and there is no evidence for a renal stone.  Due to his history of cancer and for tension and immunocompromised state, I did discuss this patient with Dr. Sonny Dandy. After reviewing the CBC and differential, it was his belief that his leukocytosis was likely related to the prednisone and Neulasta. The patient was treated with a medication and was also treated with Levaquin for a presumed urinary tract infection. He is feeling better and will be discharged to home. He has a follow-up appointment on Wednesday with Dr. Earlie Server, who is his oncologist. He will return to the ER if his symptoms worsen before then.    Veryl Speak, MD 09/30/14 [phone]

## 2014-09-30 NOTE — Telephone Encounter (Signed)
Called [name] to see if [name] had received a refill on pain medication. Spoke with [name] and she stated [name] was taken to the ED yesterday 09/29/14 and was given a prescription for Oxycodone.  [name] also stated she wants to know what's going on with [name].  Informed [name] and [name] that we would revaluate [name] at next appointment on 10/02/14 with Awilda Metro, PA.  [name] and [name] verbalized understanding and stated that [name] has enough pain medication to get him until next appointment.  Informed Awilda Metro, PA.

## 2014-09-30 NOTE — ED Notes (Signed)
Pt is currently receiving chemo and had his last dose of radiation last week for lung cancer. States he has been having right flank pain that started today. Has taken the percocet at 8pm without any relief. States he has had urinary frequency. Pt is normally sob and wears 02 at 2l via n/c. States he feels a little more sob today than normal. Denies hx of kidney stones.

## 2014-09-30 NOTE — ED Notes (Signed)
Dr. Stark Jock at [facility].

## 2014-10-01 LAB — URINE CULTURE: Colony Count: 5000

## 2014-10-01 LAB — PATHOLOGIST SMEAR REVIEW

## 2014-10-02 ENCOUNTER — Ambulatory Visit (HOSPITAL_BASED_OUTPATIENT_CLINIC_OR_DEPARTMENT_OTHER): Payer: Medicare Other

## 2014-10-02 ENCOUNTER — Ambulatory Visit (HOSPITAL_BASED_OUTPATIENT_CLINIC_OR_DEPARTMENT_OTHER): Payer: Medicare Other | Admitting: Physician Assistant

## 2014-10-02 ENCOUNTER — Encounter: Payer: Self-pay | Admitting: Physician Assistant

## 2014-10-02 ENCOUNTER — Other Ambulatory Visit (HOSPITAL_BASED_OUTPATIENT_CLINIC_OR_DEPARTMENT_OTHER): Payer: Medicare Other

## 2014-10-02 ENCOUNTER — Telehealth: Payer: Self-pay | Admitting: Internal Medicine

## 2014-10-02 VITALS — BP 157/79 | HR 114 | Temp 98.5°F | Resp 18 | Ht 69.5 in | Wt 169.1 lb

## 2014-10-02 DIAGNOSIS — C7971 Secondary malignant neoplasm of right adrenal gland: Secondary | ICD-10-CM

## 2014-10-02 DIAGNOSIS — C773 Secondary and unspecified malignant neoplasm of axilla and upper limb lymph nodes: Secondary | ICD-10-CM

## 2014-10-02 DIAGNOSIS — C3411 Malignant neoplasm of upper lobe, right bronchus or lung: Secondary | ICD-10-CM | POA: Diagnosis not present

## 2014-10-02 DIAGNOSIS — N12 Tubulo-interstitial nephritis, not specified as acute or chronic: Secondary | ICD-10-CM

## 2014-10-02 DIAGNOSIS — Z5111 Encounter for antineoplastic chemotherapy: Secondary | ICD-10-CM

## 2014-10-02 DIAGNOSIS — J441 Chronic obstructive pulmonary disease with (acute) exacerbation: Secondary | ICD-10-CM

## 2014-10-02 DIAGNOSIS — G939 Disorder of brain, unspecified: Secondary | ICD-10-CM

## 2014-10-02 DIAGNOSIS — N200 Calculus of kidney: Secondary | ICD-10-CM

## 2014-10-02 DIAGNOSIS — C3491 Malignant neoplasm of unspecified part of right bronchus or lung: Secondary | ICD-10-CM

## 2014-10-02 LAB — CBC WITH DIFFERENTIAL/PLATELET
BASO%: 0.2 % (ref 0.0–2.0)
Basophils Absolute: 0 10*3/uL (ref 0.0–0.1)
EOS%: 0 % (ref 0.0–7.0)
Eosinophils Absolute: 0 10*3/uL (ref 0.0–0.5)
HCT: 33.6 % — ABNORMAL LOW (ref 38.4–49.9)
HGB: 11.1 g/dL — ABNORMAL LOW (ref 13.0–17.1)
LYMPH%: 6.5 % — AB (ref 14.0–49.0)
MCH: 30.4 pg (ref 27.2–33.4)
MCHC: 33 g/dL (ref 32.0–36.0)
MCV: 92 fL (ref 79.3–98.0)
MONO#: 2.6 10*3/uL — ABNORMAL HIGH (ref 0.1–0.9)
MONO%: 14 % (ref 0.0–14.0)
NEUT#: 14.8 10*3/uL — ABNORMAL HIGH (ref 1.5–6.5)
NEUT%: 79.3 % — ABNORMAL HIGH (ref 39.0–75.0)
Platelets: 367 10*3/uL (ref 140–400)
RBC: 3.65 10*6/uL — AB (ref 4.20–5.82)
RDW: 13.4 % (ref 11.0–14.6)
WBC: 18.7 10*3/uL — ABNORMAL HIGH (ref 4.0–10.3)
lymph#: 1.2 10*3/uL (ref 0.9–3.3)

## 2014-10-02 LAB — COMPREHENSIVE METABOLIC PANEL (CC13)
ALBUMIN: 2.9 g/dL — AB (ref 3.5–5.0)
ALT: 29 U/L (ref 0–55)
ANION GAP: 10 meq/L (ref 3–11)
AST: 17 U/L (ref 5–34)
Alkaline Phosphatase: 131 U/L (ref 40–150)
BUN: 10 mg/dL (ref 7.0–26.0)
CALCIUM: 9 mg/dL (ref 8.4–10.4)
CO2: 28 meq/L (ref 22–29)
Chloride: 94 mEq/L — ABNORMAL LOW (ref 98–109)
Creatinine: 0.8 mg/dL (ref 0.7–1.3)
EGFR: 88 mL/min/{1.73_m2} — AB (ref >90)
Glucose: 106 mg/dl (ref 70–140)
Potassium: 3.8 mEq/L (ref 3.5–5.1)
SODIUM: 132 meq/L — AB (ref 136–145)
Total Bilirubin: 0.41 mg/dL (ref 0.20–1.20)
Total Protein: 6.7 g/dL (ref 6.4–8.3)

## 2014-10-02 MED ORDER — LORAZEPAM 0.5 MG PO TABS: ORAL_TABLET | ORAL | Status: DC

## 2014-10-02 MED ORDER — SODIUM CHLORIDE 0.9 % IV SOLN
Freq: Once | INTRAVENOUS | Status: AC
  Administered 2014-10-02: 11:00:00 via INTRAVENOUS
  Filled 2014-10-02: qty 8

## 2014-10-02 MED ORDER — SODIUM CHLORIDE 0.9 % IV SOLN
502.0000 mg | Freq: Once | INTRAVENOUS | Status: AC
  Administered 2014-10-02: 500 mg via INTRAVENOUS
  Filled 2014-10-02: qty 50

## 2014-10-02 MED ORDER — SODIUM CHLORIDE 0.9 % IV SOLN
1000.0000 mg/m2 | Freq: Once | INTRAVENOUS | Status: AC
  Administered 2014-10-02: 1976 mg via INTRAVENOUS
  Filled 2014-10-02: qty 51.97

## 2014-10-02 MED ORDER — OXYCODONE HCL 10 MG PO TABS: 10.0000 mg | ORAL_TABLET | ORAL | Status: DC | PRN

## 2014-10-02 MED ORDER — SODIUM CHLORIDE 0.9 % IV SOLN
Freq: Once | INTRAVENOUS | Status: AC
  Administered 2014-10-02: 11:00:00 via INTRAVENOUS

## 2014-10-02 NOTE — Telephone Encounter (Signed)
gave and printed appt sched and avs for pt for March and April....sed added tx.

## 2014-10-02 NOTE — Patient Instructions (Signed)
[facility] Discharge Instructions for Patients Receiving Chemotherapy  Today you received the following chemotherapy agents Carboplatin and Gemzar.  To help prevent nausea and vomiting after your treatment, we encourage you to take your nausea medication as prescribed.   If you develop nausea and vomiting that is not controlled by your nausea medication, call the clinic.   BELOW ARE SYMPTOMS THAT SHOULD BE REPORTED IMMEDIATELY:  *FEVER GREATER THAN 100.5 F  *CHILLS WITH OR WITHOUT FEVER  NAUSEA AND VOMITING THAT IS NOT CONTROLLED WITH YOUR NAUSEA MEDICATION  *UNUSUAL SHORTNESS OF BREATH  *UNUSUAL BRUISING OR BLEEDING  TENDERNESS IN MOUTH AND THROAT WITH OR WITHOUT PRESENCE OF ULCERS  *URINARY PROBLEMS  *BOWEL PROBLEMS  UNUSUAL RASH Items with * indicate a potential emergency and should be followed up as soon as possible.  Feel free to call the clinic you have any questions or concerns. The clinic phone number is [phone].  Please show the [facility] at check-in to the Emergency Department and triage nurse.

## 2014-10-02 NOTE — Progress Notes (Addendum)
[place] [phone]   [phone]  OFFICE PROGRESS NOTE  [name], MD [address] [place] [address]  DIAGNOSIS: Metastatic non-small cell lung cancer, squamous cell carcinoma initially diagnosed as Stage IIA (T2b., N0, M0) non-small cell lung cancer, squamous cell carcinoma diagnosed in December of 2013   PRIOR THERAPY:  1) Status post right upper lobectomy under the care of Dr. Prescott Gum on 09/14/2012.  2) Adjuvant chemotherapy with cisplatin 75 mg/M2 and docetaxel 75 mg/M2 with Neulasta support every 3 weeks, status post 1 cycle. Starting with cycle #2, patient will receive carboplatin for an AUC initially of 4.5 and paclitaxel at 175 mg per meter squared with Neulasta support given every 3 weeks. Status post a total of 3 cycles.   CURRENT THERAPY: Systemic chemotherapy was carboplatin for AUC of 5 on day 1 and gemcitabine 1000 MG/M2 on days 1 and 8 every 3 weeks. First dose 09/11/2014. Status post 1 cycle  CHEMOTHERAPY INTENT: Palliative. CURRENT # OF CHEMOTHERAPY CYCLES: 2 CURRENT ANTIEMETICS: Zofran, dexamethasone and Compazine  CURRENT SMOKING STATUS: Quit smoking recently.  ORAL CHEMOTHERAPY AND CONSENT: None  CURRENT BISPHOSPHONATES USE: None  PAIN MANAGEMENT: 3/10 controlled by Norco when necessary  NARCOTICS INDUCED CONSTIPATION: Milk of magnesia on as-needed basis.  LIVING WILL AND CODE STATUS: No CODE BLUE.   INTERVAL HISTORY:  [name] 74 y.o. male returns to the clinic today for followup visit accompanied his wife.  The patient was found recently on repeat CT scan of the chest as well as PET scan to have evidence for disease recurrence. He was seen by Dr. Prescott Gum and repeat bronchoscopy with endobronchial ultrasound and biopsies as well as biopsy of the right scalene lymph node showed recurrent squamous cell carcinoma. He is status post one cycle of systemic chemotherapy with carboplatin and  gemcitabine. He is not feeling well. He has had nausea and vomiting with difficulty keeping anything down. He is not sleeping well secondary to difficulty breathing and pain. He also reports increased shortness of breath and increased frequency in the use of his albuterol. He is using oxygen with humidification that does not seem to be helping his dyspnea. He is having right sided pain and notes abdominal distension. He was seen in the emergency room due to the pain. A renal stone CT was done on 09/30/2014  That revealed new bilateral perinephric stranding concerning for pyelonephritis. He was placed on a course of Levaquin and started on oxycodone 10 mg. Despite these measures he still is having pain. He has only had one cycle of chemotherapy thus far.He continues to have a mild cough and no hemoptysis. He quit smoking recently. He denied having any fever or chills, no weight loss or night sweats.   MEDICAL HISTORY: Past Medical History  Diagnosis Date  . Arthritis   . Wheezing   . Sore throat   . Carotid artery occlusion   . COPD (chronic obstructive pulmonary disease)   . Lumbar disc disease   . Restless leg syndrome   . Allergic rhinitis   . Hypertension   . Anxiety     anxiety  . Shortness of breath   . Lung mass   . GERD (gastroesophageal reflux disease)   . H/O hiatal hernia   . Pre-operative cardiovascular examination   . Nonspecific abnormal unspecified cardiovascular function study   . Peripheral vascular disease, unspecified   . Pneumonia 2014    ?   Marland Kitchen  On home oxygen therapy     prn  . Constipation   . Cancer 09/14/12    invasive mod diff squamous cell ca    ALLERGIES:  has No Known Allergies.  MEDICATIONS:  Current Outpatient Prescriptions  Medication Sig Dispense Refill  . aspirin EC 81 MG tablet Take 81 mg by mouth daily. Takes one tablet every evening.    . clonazePAM (KLONOPIN) 0.5 MG tablet Take 0.5 mg by mouth 2 (two) times daily as needed for anxiety. Takes one  tablet by mouth two times daily as needed for anxiety.    Marland Kitchen HYDROcodone-acetaminophen (NORCO) 7.5-325 MG per tablet Take 1 tablet by mouth every 4 (four) hours as needed for pain. 40 tablet 0  . isosorbide mononitrate (IMDUR) 30 MG 24 hr tablet Take 1 tablet (30 mg total) by mouth daily. 90 tablet 3  . levofloxacin (LEVAQUIN) 750 MG tablet Take 1 tablet (750 mg total) by mouth daily. X 7 days 7 tablet 0  . methylPREDNIsolone (MEDROL DOSPACK) 4 MG tablet follow package directions 21 tablet 0  . metoprolol tartrate (LOPRESSOR) 25 MG tablet Take 1/2 tablet (12.5 mg) by mouth twice daily as needed for heart rates > 110 30 tablet 3  . omeprazole (PRILOSEC) 40 MG capsule Take 40 mg by mouth daily.    . Oxycodone HCl 10 MG TABS Take 1 tablet (10 mg total) by mouth every 4 (four) hours as needed. 60 tablet 0  . pramipexole (MIRAPEX) 1 MG tablet Take 1 mg by mouth at bedtime.    . promethazine-codeine (PHENERGAN WITH CODEINE) 6.25-10 MG/5ML syrup Take 5 mLs by mouth every 6 (six) hours as needed for cough. 120 mL 0  . zolpidem (AMBIEN) 10 MG tablet Take 10 mg by mouth at bedtime as needed for sleep.    Marland Kitchen LORazepam (ATIVAN) 0.5 MG tablet Take 1 tablet by mouth or sublingually every 8 hours as needed for nausea. May take 1 tablet by mouth at bedtime. 40 tablet 0   No current facility-administered medications for this visit.    SURGICAL HISTORY:  Past Surgical History  Procedure Laterality Date  . Carotid endarterectomy  10/15/2010    left  . Hand surgery      repair of the left long, ring, and small finger after a saw injury  . Video bronchoscopy  07/24/2012    Procedure: VIDEO BRONCHOSCOPY WITH FLUORO;  Surgeon: Kathee Delton, MD;  Location: [facility];  Service: Cardiopulmonary;  Laterality: Bilateral;  . Lobectomy Right 09/14/2012    Procedure: LOBECTOMY;  Surgeon: Ivin Poot, MD;  Location: [facility];  Service: Thoracic;  Laterality: Right;  RIGHT UPPER LOBECTOMY  . Video assisted thoracoscopy  Right 09/14/2012    Procedure: VIDEO ASSISTED THORACOSCOPY;  Surgeon: Ivin Poot, MD;  Location: [facility];  Service: Thoracic;  Laterality: Right;  . Video bronchoscopy with endobronchial ultrasound N/A 08/27/2014    Procedure: VIDEO BRONCHOSCOPY WITH ENDOBRONCHIAL ULTRASOUND;  Surgeon: Ivin Poot, MD;  Location: [facility];  Service: Thoracic;  Laterality: N/A;  . Scalene node biopsy Right 08/27/2014    Procedure: BIOPSY SCALENE NODE;  Surgeon: Ivin Poot, MD;  Location: [facility];  Service: Thoracic;  Laterality: Right;  . Sterotactic radiosurgery Right 09/25/14    right parietal 30Gy/1 fx    REVIEW OF SYSTEMS:  Constitutional: positive for fatigue Eyes: negative Ears, nose, mouth, throat, and face: negative Respiratory: positive for cough and dyspnea on exertion Cardiovascular: negative Gastrointestinal: positive for nausea and vomiting  Genitourinary:negative Integument/breast: negative Hematologic/lymphatic: negative Musculoskeletal:positive for right rib cage pain Neurological: negative Behavioral/Psych: negative Endocrine: negative Allergic/Immunologic: negative   PHYSICAL EXAMINATION: General appearance: alert, cooperative and no distress Head: Normocephalic, without obvious abnormality, atraumatic Neck: no adenopathy, no JVD, supple, symmetrical, trachea midline and thyroid not enlarged, symmetric, no tenderness/mass/nodules Lymph nodes: Cervical, supraclavicular, and axillary nodes normal. Resp: wheezes bilaterally Back: symmetric, no curvature. ROM normal. No CVA tenderness. Cardio: regular rate and rhythm, S1, S2 normal, no murmur, click, rub or gallop GI: soft, non-tender; bowel sounds normal; no masses,  no organomegaly Extremities: extremities normal, atraumatic, no cyanosis or edema Neurologic: Alert and oriented X 3, normal strength and tone. Normal symmetric reflexes. Normal coordination and gait  ECOG PERFORMANCE STATUS: 1 - Symptomatic but completely  ambulatory  Blood pressure 157/79, pulse 114, temperature 98.5 F (36.9 C), temperature source Oral, resp. rate 18, height 5' 9.5" (1.765 m), weight 169 lb 1.6 oz (76.703 kg), SpO2 95 %.  LABORATORY DATA: Lab Results  Component Value Date   WBC 18.7* 10/02/2014   HGB 11.1* 10/02/2014   HCT 33.6* 10/02/2014   MCV 92.0 10/02/2014   PLT 367 10/02/2014      Chemistry      Component Value Date/Time   NA 132* 10/02/2014 0839   NA 137 09/30/2014 0210   K 3.8 10/02/2014 0839   K 3.2* 09/30/2014 0210   CL 95* 09/30/2014 0210   CL 101 12/28/2012 0852   CO2 28 10/02/2014 0839   CO2 31 09/30/2014 0210   BUN 10.0 10/02/2014 0839   BUN 8 09/30/2014 0210   CREATININE 0.8 10/02/2014 0839   CREATININE 0.80 09/30/2014 0210      Component Value Date/Time   CALCIUM 9.0 10/02/2014 0839   CALCIUM 8.3* 09/30/2014 0210   ALKPHOS 131 10/02/2014 0839   ALKPHOS 124* 09/30/2014 0210   AST 17 10/02/2014 0839   AST 30 09/30/2014 0210   ALT 29 10/02/2014 0839   ALT 51 09/30/2014 0210   BILITOT 0.41 10/02/2014 0839   BILITOT 0.2* 09/30/2014 0210       RADIOGRAPHIC STUDIES: Dg Chest 2 View  09/30/2014   CLINICAL DATA:  Dyspnea, worse than usual  EXAM: CHEST  2 VIEW  COMPARISON:  08/27/2014, 05/08/1949  FINDINGS: There is mild vascular and interstitial prominence, particularly on comparison with the 05/08/2014 examination. This may indicate a degree of mild congestive heart failure. There is no frank alveolar edema. There is no effusion. There is a nodular opacity in the left lateral lung base which is new and worrisome for progression of neoplastic disease. The opacities in the right lateral lung base represent chronic healed fracture deformities of the ribs. There are prominent hilar contours bilaterally, likely adenopathy, not significantly changed.  IMPRESSION: Mild vascular and interstitial prominence, suggesting a component of mild congestive heart failure. There probably also is progression of  neoplastic disease, with a new nodular opacity in the lateral left base.   Electronically Signed   By: Andreas Newport M.D.   On: 09/30/2014 02:33   Mr Jeri Cos QA Contrast  09/17/2014   CLINICAL DATA:  Metastatic lung cancer.  SRS targeting.  EXAM: MRI HEAD WITHOUT AND WITH CONTRAST  TECHNIQUE: Multiplanar, multiecho pulse sequences of the brain and surrounding structures were obtained without and with intravenous contrast.  CONTRAST:  75mL MULTIHANCE GADOBENATE DIMEGLUMINE 529 MG/ML IV SOLN  COMPARISON:  08/08/2014  FINDINGS: There is no evidence of acute infarct, intracranial hemorrhage, midline shift, or extra-axial fluid collection.  Mild cerebral atrophy is again seen. Foci of T2 hyperintensity in the cerebral white matter and pons are similar to the prior study and nonspecific but compatible with minimal chronic small vessel ischemic disease.  Postcontrast images are mildly to moderately motion degraded, decreasing sensitivity for detection of small lesions. The 2 mm enhancing right parietal lesion is visible on axial images and unchanged in size (series 13, image 123). No new lesions are identified.  Prior right cataract extraction is noted. There is minimal bilateral maxillary sinus mucosal thickening, with a small amount of bubbly secretions in the right sphenoid sinus. Trace left mastoid effusion is noted. Major intracranial vascular flow voids are preserved.  IMPRESSION: Unchanged 2 mm right parietal lesion. No new lesions identified, however postcontrast images are degraded by motion artifact.   Electronically Signed   By: Logan Bores   On: 09/17/2014 15:07   Ct Renal Stone Study  09/30/2014   CLINICAL DATA:  Acute onset of right flank pain and increased urinary frequency. Shortness of breath. Patient on chemotherapy and recent radiation therapy for lung cancer. Initial encounter.  EXAM: CT ABDOMEN AND PELVIS WITHOUT CONTRAST  TECHNIQUE: Multidetector CT imaging of the abdomen and pelvis was  performed following the standard protocol without IV contrast.  COMPARISON:  PET/CT performed 08/10/2014, and abdominal ultrasound performed 03/29/2012  FINDINGS: Patchy bibasilar airspace opacities are mildly improved from the prior study, with underlying architectural distortion again noted at the anterior right lung base. This likely reflects interval improvement in lymphangitic spread of tumor. Diffuse coronary artery calcifications are seen.  The liver is unremarkable in appearance. A calcified granuloma is noted within the spleen. The gallbladder is within normal limits. The pancreas and adrenal glands are unremarkable.  Nonspecific perinephric stranding is noted bilaterally, more prominent on the right, new from the prior study. This raises concern for underlying pyelonephritis. Minimal right-sided pelvicaliectasis remains within normal limits. There is no evidence of hydronephrosis. No renal or ureteral stones are identified.  No free fluid is identified. The small bowel is unremarkable in appearance. The stomach is within normal limits. No acute vascular abnormalities are seen. Scattered calcification is seen along the abdominal aorta and its branches, with dense calcification seen at the origins of both renal arteries. There is mild ectasia of the distal abdominal aorta, without evidence of aneurysmal dilatation.  Prominent calcification is seen along the common femoral arteries and their branches bilaterally.  The appendix is normal in caliber, without evidence of appendicitis. The colon is unremarkable in appearance.  The bladder is mildly distended and grossly unremarkable. A small urachal remnant is incidentally noted. The prostate remains normal in size. No inguinal lymphadenopathy is seen.  No acute osseous abnormalities are identified. There is grade 1 anterolisthesis of L5 on S1, reflecting underlying bilateral pars defects at L5. Multilevel vacuum phenomenon and disc space narrowing are noted  along the lumbar spine, with endplate sclerosis.  IMPRESSION: 1. New bilateral perinephric stranding, more prominent on the right, raises concern for pyelonephritis, given the patient's symptoms. No evidence of hydronephrosis. No obstructing ureteral stone seen. 2. Patchy bibasilar airspace opacities are mildly improved, with architectural distortion again noted at the right lung base. This likely reflects interval improvement in lymphangitic spread of tumor. 3. Diffuse coronary artery calcifications again seen. 4. Scattered calcification along the abdominal aorta and its branches, with dense calcification at the origins of both renal arteries. 5. Prominent calcification along the common femoral arteries and their branches bilaterally. 6. Grade 1 anterolisthesis of L5  on S1, reflecting underlying bilateral pars defects at L5. Mild degenerative change noted along the lumbar spine.   Electronically Signed   By: Garald Balding M.D.   On: 09/30/2014 02:34    ASSESSMENT AND PLAN: This is a very pleasant 74 years old white male with history of stage IIa non-small cell lung cancer status post right upper lobectomy followed by 4 cycles of adjuvant chemotherapy.  He now presented with evidence for significant disease recurrence including lymphangitic spread of the tumor in addition to bilateral hilar and mediastinal lymphadenopathy in addition to right axillary or right subpectoral and right supraclavicular lymphadenopathy and right sided adrenal metastasis. He also has a very small brain lesion measuring 2 mm. Repeat bronchoscopy with endobronchial ultrasound and biopsies showed recurrent squamous cell carcinoma. He is currently being treated with systemic chemotherapy consisting of carboplatin for AUC of 5 on day 1 and gemcitabine 1000 MG/M2 on days 1 and 8 every 3 weeks, status post 1 cycle. The patient was discussed with and also seen by [name]. He will proceed with cycle #2 of his systemic chemotherapy  today as scheduled. We will see how he tolerates cycle#2. If he tolerates it poorly we may obtain restaging CT scans after cycle #2 or after cycle #3 if he tolerates cycle #2. For nausea he was given a prescription for Ativan 0.5 mg that he may take by mouth or sublingually every 8 hours as needed for nausea. He may also take one tablet at bedtime if needed to help him rest. For Pain management, he will continue on oxycodone 10 mg by mouth every 4 hours as needed for pain. He will continue with weekly labs as scheduled. He will return in 3 weeks prior to the start of cycle #3 for re-evaluation.  He was advised to call immediately if he has any concerning symptoms in the interval.  The patient voices understanding of current disease status and treatment options and is in agreement with the current care plan.  All questions were answered. The patient knows to call the clinic with any problems, questions or concerns. We can certainly see the patient much sooner if necessary.  Carlton Adam, PA-C 10/02/2014  ADDENDUM: Hematology/Oncology Attending: I had a face to face encounter with the patient. I recommended his care plan. This is a very pleasant 74 years old white male with metastatic non-small cell lung cancer who is currently undergoing systemic chemotherapy with carboplatin and gemcitabine is status post 1 cycle. The patient had several symptoms after starting his treatment including nausea vomiting as well as bloating and abdominal distention in addition to the persistent shortness of breath. He also had abdominal pain and CT scan of the abdomen showed renal stone with questionable new bilateral perinephric stranding concerning for pyelonephritis. He was started on a course of Levaquin in addition to pain medicine. The patient denied having any significant fever or chills. He feels much better today and ready to resume his systemic chemotherapy. We will proceed with cycle #2 of his treatment  today as a scheduled. For insomnia the patient was given prescription of Ativan 0.5 mg by mouth daily at bedtime in addition to evaluate hours as needed for nausea. He would come back for follow-up visit in 3 weeks with the start of cycle #3. If she has intolerance to cycle #2, I may consider repeating CT scan of the chest, abdomen and pelvis for restaging of his disease after the second cycle. He was advised to call immediately if  he has any concerning symptoms in the interval.  Disclaimer: This note was dictated with voice recognition software. Similar sounding words can inadvertently be transcribed and may not be corrected upon review. Eilleen Kempf., MD 10/05/2014

## 2014-10-04 NOTE — Patient Instructions (Signed)
Take Ativan as prescribed Take the oxycodone as prescribed Continue with weekly labs Follow up in 3 weeks

## 2014-10-05 DIAGNOSIS — N200 Calculus of kidney: Secondary | ICD-10-CM | POA: Insufficient documentation

## 2014-10-05 DIAGNOSIS — N12 Tubulo-interstitial nephritis, not specified as acute or chronic: Secondary | ICD-10-CM | POA: Insufficient documentation

## 2014-10-06 LAB — CULTURE, BLOOD (ROUTINE X 2)
CULTURE: NO GROWTH
Culture: NO GROWTH

## 2014-10-07 ENCOUNTER — Ambulatory Visit (HOSPITAL_BASED_OUTPATIENT_CLINIC_OR_DEPARTMENT_OTHER): Payer: Medicare Other | Admitting: Nurse Practitioner

## 2014-10-07 ENCOUNTER — Telehealth: Payer: Self-pay | Admitting: *Deleted

## 2014-10-07 ENCOUNTER — Other Ambulatory Visit (HOSPITAL_BASED_OUTPATIENT_CLINIC_OR_DEPARTMENT_OTHER): Payer: Medicare Other

## 2014-10-07 ENCOUNTER — Ambulatory Visit (HOSPITAL_COMMUNITY)
Admission: RE | Admit: 2014-10-07 | Discharge: 2014-10-07 | Disposition: A | Discharge status: Home / Self Care | Payer: Medicare Other | Source: Ambulatory Visit | Attending: Nurse Practitioner | Admitting: Nurse Practitioner

## 2014-10-07 ENCOUNTER — Encounter (HOSPITAL_COMMUNITY): Payer: Self-pay

## 2014-10-07 ENCOUNTER — Encounter: Payer: Self-pay | Admitting: Internal Medicine

## 2014-10-07 ENCOUNTER — Other Ambulatory Visit: Payer: Self-pay

## 2014-10-07 VITALS — BP 169/81 | HR 101 | Temp 98.7°F | Resp 20 | Wt 164.8 lb

## 2014-10-07 DIAGNOSIS — E860 Dehydration: Secondary | ICD-10-CM

## 2014-10-07 DIAGNOSIS — C3411 Malignant neoplasm of upper lobe, right bronchus or lung: Secondary | ICD-10-CM

## 2014-10-07 DIAGNOSIS — C3490 Malignant neoplasm of unspecified part of unspecified bronchus or lung: Secondary | ICD-10-CM | POA: Insufficient documentation

## 2014-10-07 DIAGNOSIS — Z85118 Personal history of other malignant neoplasm of bronchus and lung: Secondary | ICD-10-CM

## 2014-10-07 DIAGNOSIS — J449 Chronic obstructive pulmonary disease, unspecified: Secondary | ICD-10-CM | POA: Diagnosis present

## 2014-10-07 DIAGNOSIS — R0600 Dyspnea, unspecified: Secondary | ICD-10-CM

## 2014-10-07 LAB — CBC WITH DIFFERENTIAL/PLATELET
BASO%: 0.2 % (ref 0.0–2.0)
Basophils Absolute: 0 10*3/uL (ref 0.0–0.1)
EOS%: 0 % (ref 0.0–7.0)
Eosinophils Absolute: 0 10*3/uL (ref 0.0–0.5)
HCT: 31.9 % — ABNORMAL LOW (ref 38.4–49.9)
HEMOGLOBIN: 10.8 g/dL — AB (ref 13.0–17.1)
LYMPH%: 17.7 % (ref 14.0–49.0)
MCH: 30.7 pg (ref 27.2–33.4)
MCHC: 33.9 g/dL (ref 32.0–36.0)
MCV: 90.6 fL (ref 79.3–98.0)
MONO#: 0.5 10*3/uL (ref 0.1–0.9)
MONO%: 8 % (ref 0.0–14.0)
NEUT#: 4.5 10*3/uL (ref 1.5–6.5)
NEUT%: 74.1 % (ref 39.0–75.0)
Platelets: 415 10*3/uL — ABNORMAL HIGH (ref 140–400)
RBC: 3.52 10*6/uL — AB (ref 4.20–5.82)
RDW: 13.2 % (ref 11.0–14.6)
WBC: 6 10*3/uL (ref 4.0–10.3)
lymph#: 1.1 10*3/uL (ref 0.9–3.3)

## 2014-10-07 LAB — COMPREHENSIVE METABOLIC PANEL (CC13)
ALK PHOS: 102 U/L (ref 40–150)
ALT: 50 U/L (ref 0–55)
AST: 35 U/L — AB (ref 5–34)
Albumin: 2.7 g/dL — ABNORMAL LOW (ref 3.5–5.0)
Anion Gap: 8 mEq/L (ref 3–11)
BUN: 14.1 mg/dL (ref 7.0–26.0)
CHLORIDE: 94 meq/L — AB (ref 98–109)
CO2: 29 mEq/L (ref 22–29)
Calcium: 8.9 mg/dL (ref 8.4–10.4)
Creatinine: 0.8 mg/dL (ref 0.7–1.3)
EGFR: >90 mL/min/{1.73_m2} (ref >90)
Glucose: 94 mg/dl (ref 70–140)
POTASSIUM: 3.7 meq/L (ref 3.5–5.1)
Sodium: 130 mEq/L — ABNORMAL LOW (ref 136–145)
Total Bilirubin: 0.67 mg/dL (ref 0.20–1.20)
Total Protein: 6.6 g/dL (ref 6.4–8.3)

## 2014-10-07 LAB — HOLD TUBE, BLOOD BANK

## 2014-10-07 MED ORDER — IOHEXOL 350 MG/ML SOLN
100.0000 mL | Freq: Once | INTRAVENOUS | Status: AC | PRN
  Administered 2014-10-07: 100 mL via INTRAVENOUS

## 2014-10-07 MED ORDER — METHYLPREDNISOLONE (PAK) 4 MG PO TABS: ORAL_TABLET | ORAL | Status: DC

## 2014-10-07 NOTE — Telephone Encounter (Signed)
Patient wife called and states that he is pale, fatigued, dizzy, and has not been eating hardly since his last chemotherapy (10/02/14). [name] NP notified. Patient will be seen by Symptom management. Labs and POF sent. Patient wife verbalized understanding.

## 2014-10-07 NOTE — Progress Notes (Signed)
I placed fmla forms for wife([name]) on the desk of nurse for [name].

## 2014-10-08 ENCOUNTER — Encounter: Payer: Self-pay | Admitting: Internal Medicine

## 2014-10-08 ENCOUNTER — Telehealth: Payer: Self-pay | Admitting: Internal Medicine

## 2014-10-08 NOTE — Telephone Encounter (Signed)
returned call and s.w. pt wife and confirmed that 3.30 lab cx per Diane....pt stated that Dr. Tyrell Antonio advised that he did not need 3.30 lab at 3.28 visit

## 2014-10-08 NOTE — Progress Notes (Signed)
I called to let patient(wife) know fmla forms for [name] are ready. I advised the patient their copy is upfront with Ms. Wilma for pick up on 10/09/14 and I faxed to  [phone]

## 2014-10-09 ENCOUNTER — Other Ambulatory Visit: Payer: Medicare Other

## 2014-10-09 ENCOUNTER — Ambulatory Visit (HOSPITAL_BASED_OUTPATIENT_CLINIC_OR_DEPARTMENT_OTHER): Payer: Medicare Other

## 2014-10-09 DIAGNOSIS — C3411 Malignant neoplasm of upper lobe, right bronchus or lung: Secondary | ICD-10-CM | POA: Diagnosis not present

## 2014-10-09 DIAGNOSIS — Z5111 Encounter for antineoplastic chemotherapy: Secondary | ICD-10-CM | POA: Diagnosis not present

## 2014-10-09 MED ORDER — SODIUM CHLORIDE 0.9 % IV SOLN
Freq: Once | INTRAVENOUS | Status: AC
  Administered 2014-10-09: 09:00:00 via INTRAVENOUS

## 2014-10-09 MED ORDER — PROCHLORPERAZINE MALEATE 10 MG PO TABS
ORAL_TABLET | ORAL | Status: AC
  Filled 2014-10-09: qty 1

## 2014-10-09 MED ORDER — SODIUM CHLORIDE 0.9 % IV SOLN
1000.0000 mg/m2 | Freq: Once | INTRAVENOUS | Status: AC
  Administered 2014-10-09: 1976 mg via INTRAVENOUS
  Filled 2014-10-09: qty 51.97

## 2014-10-09 MED ORDER — PROCHLORPERAZINE MALEATE 10 MG PO TABS
10.0000 mg | ORAL_TABLET | Freq: Once | ORAL | Status: AC
  Administered 2014-10-09: 10 mg via ORAL

## 2014-10-09 NOTE — Patient Instructions (Signed)
[facility] Discharge Instructions for Patients Receiving Chemotherapy  Today you received the following chemotherapy agents Gemzar  To help prevent nausea and vomiting after your treatment, we encourage you to take your nausea medication as directed/prescribed   If you develop nausea and vomiting that is not controlled by your nausea medication, call the clinic.   BELOW ARE SYMPTOMS THAT SHOULD BE REPORTED IMMEDIATELY:  *FEVER GREATER THAN 100.5 F  *CHILLS WITH OR WITHOUT FEVER  NAUSEA AND VOMITING THAT IS NOT CONTROLLED WITH YOUR NAUSEA MEDICATION  *UNUSUAL SHORTNESS OF BREATH  *UNUSUAL BRUISING OR BLEEDING  TENDERNESS IN MOUTH AND THROAT WITH OR WITHOUT PRESENCE OF ULCERS  *URINARY PROBLEMS  *BOWEL PROBLEMS  UNUSUAL RASH Items with * indicate a potential emergency and should be followed up as soon as possible.  Feel free to call the clinic you have any questions or concerns. The clinic phone number is [phone].  Please show the [facility] at check-in to the Emergency Department and triage nurse.

## 2014-10-12 ENCOUNTER — Encounter: Payer: Self-pay | Admitting: Nurse Practitioner

## 2014-10-12 DIAGNOSIS — E860 Dehydration: Secondary | ICD-10-CM | POA: Insufficient documentation

## 2014-10-12 NOTE — Assessment & Plan Note (Signed)
Pt last received his carboplatin/Gemcitabine chemotherapy on 10/02/14.  He is scheduled for his next chemo on 10/09/14.

## 2014-10-12 NOTE — Assessment & Plan Note (Signed)
Pt appears dehydrated today; but refused IV fluid rehydration.  He stated that he would push fluids at home.

## 2014-10-12 NOTE — Assessment & Plan Note (Addendum)
Pt c/o increased dyspnea, weakness, and dehydration for past week.  Denies any specific chest pain/pressure; or pain with inspiration. Pt already uses home O2 on an as needed basis. He also has nebulizers at home as well. On exam- lung fields essentially clear; with no wheezing. Pt does not appear SOB on exam.    CT Angio of the chest was negative for PE or pneumonia; and revealed stable disease.   Pt was prescribed a Medrol dose pak.   Most likely, patient's complaints secondary to chemo. Advised pt to push fluids; and call/return if symptoms persist or worsen.

## 2014-10-12 NOTE — Progress Notes (Signed)
SYMPTOM MANAGEMENT CLINIC   HPI: [name] 74 y.o. male diagnosed with lung cancer.  Currently undergoing carboplatin/gemcitabine chemotherapy.    Pt is c/o increased SOB, weakness, and mild dehydration since receiving his last chemo on 10/02/14.  He denies any nausea/vomiting/diarrhea.  He states that he has had minimal appetite; and poor oral intake. He denies any chest pain, chest pressure, or pain with imspiration He also denies any fever or chills.   HPI  ROS  Past Medical History  Diagnosis Date  . Arthritis   . Wheezing   . Sore throat   . Carotid artery occlusion   . COPD (chronic obstructive pulmonary disease)   . Lumbar disc disease   . Restless leg syndrome   . Allergic rhinitis   . Hypertension   . Anxiety     anxiety  . Shortness of breath   . Lung mass   . GERD (gastroesophageal reflux disease)   . H/O hiatal hernia   . Pre-operative cardiovascular examination   . Nonspecific abnormal unspecified cardiovascular function study   . Peripheral vascular disease, unspecified   . Pneumonia 2014    ?   Marland Kitchen On home oxygen therapy     prn  . Constipation   . Cancer 09/14/12    invasive mod diff squamous cell ca    Past Surgical History  Procedure Laterality Date  . Carotid endarterectomy  10/15/2010    left  . Hand surgery      repair of the left long, ring, and small finger after a saw injury  . Video bronchoscopy  07/24/2012    Procedure: VIDEO BRONCHOSCOPY WITH FLUORO;  Surgeon: Kathee Delton, MD;  Location: [facility];  Service: Cardiopulmonary;  Laterality: Bilateral;  . Lobectomy Right 09/14/2012    Procedure: LOBECTOMY;  Surgeon: Ivin Poot, MD;  Location: [place];  Service: Thoracic;  Laterality: Right;  RIGHT UPPER LOBECTOMY  . Video assisted thoracoscopy Right 09/14/2012    Procedure: VIDEO ASSISTED THORACOSCOPY;  Surgeon: Ivin Poot, MD;  Location: [place];  Service: Thoracic;  Laterality: Right;  . Video bronchoscopy with endobronchial  ultrasound N/A 08/27/2014    Procedure: VIDEO BRONCHOSCOPY WITH ENDOBRONCHIAL ULTRASOUND;  Surgeon: Ivin Poot, MD;  Location: [facility] OR;  Service: Thoracic;  Laterality: N/A;  . Scalene node biopsy Right 08/27/2014    Procedure: BIOPSY SCALENE NODE;  Surgeon: Ivin Poot, MD;  Location: [facility] OR;  Service: Thoracic;  Laterality: Right;  . Sterotactic radiosurgery Right 09/25/14    right parietal 30Gy/1 fx    has Occlusion and stenosis of carotid artery without mention of cerebral infarction; COPD (chronic obstructive pulmonary disease); Lung mass; Pre-operative cardiovascular examination; Nonspecific abnormal unspecified cardiovascular function study; Peripheral vascular disease, unspecified; Lung cancer; Pain in limb; Coronary atherosclerosis of native coronary artery; Essential hypertension, benign; Tobacco use disorder; Carotid stenosis; Aftercare following surgery of the circulatory system; Weakness-Hand and  Legs; COPD exacerbation; Tachycardia; Dyspnea; Brain metastasis; Chemotherapy induced neutropenia; Renal stone; Pyelonephritis; and Dehydration on his problem list.    has No Known Allergies.    Medication List       This list is accurate as of: 10/07/14 11:59 PM.  Always use your most recent med list.               aspirin EC 81 MG tablet  Take 81 mg by mouth daily. Takes one tablet every evening.     clonazePAM 0.5 MG tablet  Commonly known as:  KLONOPIN  Take 0.5 mg by mouth 2 (two) times daily as needed for anxiety. Takes one tablet by mouth two times daily as needed for anxiety.     HYDROcodone-acetaminophen 7.5-325 MG per tablet  Commonly known as:  NORCO  Take 1 tablet by mouth every 4 (four) hours as needed for pain.     isosorbide mononitrate 30 MG 24 hr tablet  Commonly known as:  IMDUR  Take 1 tablet (30 mg total) by mouth daily.     levofloxacin 750 MG tablet  Commonly known as:  LEVAQUIN  Take 1 tablet (750 mg total) by mouth daily. X 7 days     LORazepam  0.5 MG tablet  Commonly known as:  ATIVAN  Take 1 tablet by mouth or sublingually every 8 hours as needed for nausea. May take 1 tablet by mouth at bedtime.     methylPREDNIsolone 4 MG tablet  Commonly known as:  MEDROL DOSPACK  follow package directions     omeprazole 40 MG capsule  Commonly known as:  PRILOSEC  Take 40 mg by mouth daily.     Oxycodone HCl 10 MG Tabs  Take 1 tablet (10 mg total) by mouth every 4 (four) hours as needed.     pramipexole 1 MG tablet  Commonly known as:  MIRAPEX  Take 1 mg by mouth at bedtime.     promethazine-codeine 6.25-10 MG/5ML syrup  Commonly known as:  PHENERGAN with CODEINE  Take 5 mLs by mouth every 6 (six) hours as needed for cough.     zolpidem 10 MG tablet  Commonly known as:  AMBIEN  Take 10 mg by mouth at bedtime as needed for sleep.         PHYSICAL EXAMINATION  Oncology Vitals 10/09/2014 10/07/2014 10/02/2014 09/30/2014 09/30/2014 09/30/2014 09/30/2014  Height - - 177 cm - - - -  Weight - 74.753 kg 76.703 kg - - - -  Weight (lbs) - 164 lbs 13 oz 169 lbs 2 oz - - - -  BMI (kg/m2) - - 24.61 kg/m2 - - - -  Temp 98.7 98.7 98.5 - - - -  Pulse 100 101 114 - 100 103 99  Resp _0 - - - -  SpO2 99 - 95 96 94 89 94  BSA (m2) - - 1.94 m2 - - - -   BP Readings from Last 3 Encounters:  10/09/14 135/65  10/07/14 169/81  10/02/14 157/79    Physical Exam  Constitutional: He is oriented to person, place, and time. Vital signs are normal. He appears dehydrated. He appears unhealthy.  HENT:  Head: Normocephalic and atraumatic.  Mouth/Throat: Oropharynx is clear and moist.  Eyes: Conjunctivae and EOM are normal. Pupils are equal, round, and reactive to light. Right eye exhibits no discharge. Left eye exhibits no discharge. No scleral icterus.  Neck: Normal range of motion. Neck supple. No JVD present. No tracheal deviation present. No thyromegaly present.  Cardiovascular: Normal rate, regular rhythm, normal heart sounds and intact  distal pulses.   Pulmonary/Chest: Effort normal and breath sounds normal. No respiratory distress. He has no wheezes. He has no rales. He exhibits no tenderness.  On O2 at 2 LNC.   Abdominal: Soft. Bowel sounds are normal. He exhibits no distension and no mass. There is no tenderness. There is no rebound and no guarding.  Musculoskeletal: Normal range of motion. He exhibits no edema or tenderness.  Lymphadenopathy:    He has no cervical adenopathy.  Neurological: He is alert and oriented to person, place, and time.  Skin: Skin is warm and dry. No rash noted. No erythema. There is pallor.  Psychiatric: Affect normal.  Nursing note and vitals reviewed.   LABORATORY DATA:. Appointment on 10/07/2014  Component Date Value Ref Range Status  . WBC 10/07/2014 6.0  4.0 - 10.3 10e3/uL Final  . NEUT# 10/07/2014 4.5  1.5 - 6.5 10e3/uL Final  . HGB 10/07/2014 10.8* 13.0 - 17.1 g/dL Final  . HCT 10/07/2014 31.9* 38.4 - 49.9 % Final  . Platelets 10/07/2014 415* 140 - 400 10e3/uL Final  . MCV 10/07/2014 90.6  79.3 - 98.0 fL Final  . MCH 10/07/2014 30.7  27.2 - 33.4 pg Final  . MCHC 10/07/2014 33.9  32.0 - 36.0 g/dL Final  . RBC 10/07/2014 3.52* 4.20 - 5.82 10e6/uL Final  . RDW 10/07/2014 13.2  11.0 - 14.6 % Final  . lymph# 10/07/2014 1.1  0.9 - 3.3 10e3/uL Final  . MONO# 10/07/2014 0.5  0.1 - 0.9 10e3/uL Final  . Eosinophils Absolute 10/07/2014 0.0  0.0 - 0.5 10e3/uL Final  . Basophils Absolute 10/07/2014 0.0  0.0 - 0.1 10e3/uL Final  . NEUT% 10/07/2014 74.1  39.0 - 75.0 % Final  . LYMPH% 10/07/2014 17.7  14.0 - 49.0 % Final  . MONO% 10/07/2014 8.0  0.0 - 14.0 % Final  . EOS% 10/07/2014 0.0  0.0 - 7.0 % Final  . BASO% 10/07/2014 0.2  0.0 - 2.0 % Final  . Sodium 10/07/2014 130* 136 - 145 mEq/L Final  . Potassium 10/07/2014 3.7  3.5 - 5.1 mEq/L Final  . Chloride 10/07/2014 94* 98 - 109 mEq/L Final  . CO2 10/07/2014 29  22 - 29 mEq/L Final  . Glucose 10/07/2014 94  70 - 140 mg/dl Final  . BUN  10/07/2014 14.1  7.0 - 26.0 mg/dL Final  . Creatinine 10/07/2014 0.8  0.7 - 1.3 mg/dL Final  . Total Bilirubin 10/07/2014 0.67  0.20 - 1.20 mg/dL Final  . Alkaline Phosphatase 10/07/2014 102  40 - 150 U/L Final  . AST 10/07/2014 35* 5 - 34 U/L Final  . ALT 10/07/2014 50  0 - 55 U/L Final  . Total Protein 10/07/2014 6.6  6.4 - 8.3 g/dL Final  . Albumin 10/07/2014 2.7* 3.5 - 5.0 g/dL Final  . Calcium 10/07/2014 8.9  8.4 - 10.4 mg/dL Final  . Anion Gap 10/07/2014 8  3 - 11 mEq/L Final  . EGFR 10/07/2014 >90  >90 ml/min/1.73 m2 Final   eGFR is calculated using the CKD-EPI Creatinine Equation (2009)  . Hold Tube, Blood Bank 10/07/2014 Blood Bank Order Cancelled   Final     RADIOGRAPHIC STUDIES: CT Angio: ADDENDUM: A more recent PET-CT examination has become available, dated August 10, 2014. In comparison of the current CT examination to the prior PET-CT, no new lesions are appreciable currently. Indeed, there is slightly less infiltrate in the right base posteriorly compared to recent PET study. The more well-defined nodular opacities remain essentially stable as does the degree of scarring and interstitial fibrosis. The degree of adenopathy is also stable as is the right adrenal mass described on the current examination. No new lesions are appreciable compared to the January 2016 study.   ASSESSMENT/PLAN:    Lung cancer Pt last received his carboplatin/Gemcitabine chemotherapy on 10/02/14.  He is scheduled for his next chemo on 10/09/14.     Dyspnea Pt c/o increased dyspnea, weakness, and dehydration for past week.  Denies any specific chest pain/pressure; or pain with inspiration. Pt already uses home O2 on an as needed basis. He also has nebulizers at home as well. On exam- lung fields essentially clear; with no wheezing. Pt does not appear SOB on exam.    CT Angio of the chest was negative for PE or pneumonia; and revealed stable disease.   Pt was prescribed a Medrol dose pak.     Most likely, patient's complaints secondary to chemo. Advised pt to push fluids; and call/return if symptoms persist or worsen.    Dehydration Pt appears dehydrated today; but refused IV fluid rehydration.  He stated that he would push fluids at home.      Patient stated understanding of all instructions; and was in agreement with this plan of care. The patient knows to call the clinic with any problems, questions or concerns.   This was a shared visit with [name].  Total time spent with patient was 40 minutes;  with greater than 75 percent of that time spent in face to face counseling regarding patient's symptoms,  and coordination of care and follow up.  Disclaimer: This note was dictated with voice recognition software. Similar sounding words can inadvertently be transcribed and may not be corrected upon review.   Drue Second, NP 10/12/2014   ADDENDUM: Hematology/Oncology Attending: I had a face to face encounter with the patient. I recommended his care plan. This is a very pleasant 74 years old white male with metastatic non-small cell lung cancer, squamous cell carcinoma who is currently undergoing systemic chemotherapy with carboplatin and gemcitabine is status post 1 cycle. The patient tolerated the first cycle of his treatment fairly well but has been complaining of increasing shortness of breath and cough. We will order CT angiogram of the chest for evaluation of his condition and to rule out pulmonary embolism. The CT angiogram of the chest showed no evidence for pulmonary embolism or pneumonia and he has a stable disease. We will start the patient on Medrol Dosepak for COPD. He will resume her systemic chemotherapy with carbo platinum and gemcitabine as a scheduled. The patient would come back for follow-up visit with the next cycle of his chemotherapy. He was advised to call immediately if he has any concerning symptoms in the interval.  Disclaimer: This note was  dictated with voice recognition software. Similar sounding words can inadvertently be transcribed and may be missed upon review. Eilleen Kempf., MD 10/14/2014

## 2014-10-16 ENCOUNTER — Other Ambulatory Visit (HOSPITAL_BASED_OUTPATIENT_CLINIC_OR_DEPARTMENT_OTHER): Payer: Medicare Other

## 2014-10-16 DIAGNOSIS — C3411 Malignant neoplasm of upper lobe, right bronchus or lung: Secondary | ICD-10-CM

## 2014-10-16 LAB — COMPREHENSIVE METABOLIC PANEL (CC13)
ALT: 52 U/L (ref 0–55)
ANION GAP: 7 meq/L (ref 3–11)
AST: 25 U/L (ref 5–34)
Albumin: 2.9 g/dL — ABNORMAL LOW (ref 3.5–5.0)
Alkaline Phosphatase: 90 U/L (ref 40–150)
BILIRUBIN TOTAL: 0.2 mg/dL (ref 0.20–1.20)
BUN: 8.4 mg/dL (ref 7.0–26.0)
CHLORIDE: 99 meq/L (ref 98–109)
CO2: 31 meq/L — AB (ref 22–29)
Calcium: 9 mg/dL (ref 8.4–10.4)
Creatinine: 0.8 mg/dL (ref 0.7–1.3)
EGFR: >90 mL/min/{1.73_m2} (ref >90)
Glucose: 113 mg/dl (ref 70–140)
Potassium: 4 mEq/L (ref 3.5–5.1)
Sodium: 137 mEq/L (ref 136–145)
Total Protein: 6.2 g/dL — ABNORMAL LOW (ref 6.4–8.3)

## 2014-10-16 LAB — CBC WITH DIFFERENTIAL/PLATELET
BASO%: 1.2 % (ref 0.0–2.0)
Basophils Absolute: 0 10*3/uL (ref 0.0–0.1)
EOS%: 0.4 % (ref 0.0–7.0)
Eosinophils Absolute: 0 10*3/uL (ref 0.0–0.5)
HEMATOCRIT: 30.6 % — AB (ref 38.4–49.9)
HEMOGLOBIN: 10.2 g/dL — AB (ref 13.0–17.1)
LYMPH%: 55 % — AB (ref 14.0–49.0)
MCH: 30.6 pg (ref 27.2–33.4)
MCHC: 33.3 g/dL (ref 32.0–36.0)
MCV: 91.9 fL (ref 79.3–98.0)
MONO#: 0.1 10*3/uL (ref 0.1–0.9)
MONO%: 5.4 % (ref 0.0–14.0)
NEUT#: 0.9 10*3/uL — ABNORMAL LOW (ref 1.5–6.5)
NEUT%: 38 % — ABNORMAL LOW (ref 39.0–75.0)
Platelets: 103 10*3/uL — ABNORMAL LOW (ref 140–400)
RBC: 3.33 10*6/uL — ABNORMAL LOW (ref 4.20–5.82)
RDW: 13.3 % (ref 11.0–14.6)
WBC: 2.4 10*3/uL — ABNORMAL LOW (ref 4.0–10.3)
lymph#: 1.3 10*3/uL (ref 0.9–3.3)

## 2014-10-17 ENCOUNTER — Encounter: Payer: Self-pay | Admitting: Interventional Cardiology

## 2014-10-17 ENCOUNTER — Ambulatory Visit (INDEPENDENT_AMBULATORY_CARE_PROVIDER_SITE_OTHER): Payer: Medicare Other | Admitting: Interventional Cardiology

## 2014-10-17 ENCOUNTER — Telehealth: Payer: Self-pay | Admitting: *Deleted

## 2014-10-17 VITALS — BP 130/70 | HR 100 | Ht 69.5 in | Wt 170.1 lb

## 2014-10-17 DIAGNOSIS — R0602 Shortness of breath: Secondary | ICD-10-CM

## 2014-10-17 DIAGNOSIS — R0600 Dyspnea, unspecified: Secondary | ICD-10-CM

## 2014-10-17 DIAGNOSIS — I2510 Atherosclerotic heart disease of native coronary artery without angina pectoris: Secondary | ICD-10-CM

## 2014-10-17 DIAGNOSIS — R000 Tachycardia, unspecified: Secondary | ICD-10-CM | POA: Diagnosis not present

## 2014-10-17 MED ORDER — BISOPROLOL FUMARATE 5 MG PO TABS: 5.0000 mg | ORAL_TABLET | Freq: Every day | ORAL | Status: DC

## 2014-10-17 NOTE — Progress Notes (Signed)
Patient ID: [name], male   DOB: [birth year], 74 y.o.   MRN: [id number]     Cardiology Office Note   Date:  10/17/2014   ID:  JUVENTINO PAVONE, DOB 1941-05-09, MRN [id number]  PCP:  [name], MD    Chief Complaint  Patient presents with  . Follow-up    elevated heart rate     Wt Readings from Last 3 Encounters:  10/17/14 170 lb 1.9 oz (77.166 kg)  10/07/14 164 lb 12.8 oz (74.753 kg)  10/02/14 169 lb 1.6 oz (76.703 kg)       History of Present Illness: LEVY Rice is a 74 y.o. male  with peripheral vascular disease who has lung cancer- treated with surgery and now with chemotherapy. At that time of his surgery, he had a stress test with an inferior wall defect. 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 He was managed medically. He has not had any significant angina.  [name] functional capacity continues to be significantly decreased. He feels fatigued. He does not walk up stairs. His activity level has dropped off. He occasionally has some dizziness.  He does not have chest discomfort at this time but does have worsening shortness of breath.  His most significant symptom at this time is persistent elevated heart rate. He has been encouraged to take more oral intake. He has had a prior ECG in December 2015 showing sinus tachycardia. He has had a chest CT showing no evidence of pulmonary embolism. He has taken metoprolol at times and this does bring down his heart rate. He is also taking albuterol frequently due to his respiratory issues.    Past Medical History  Diagnosis Date  . Arthritis   . Wheezing   . Sore throat   . Carotid artery occlusion   . COPD (chronic obstructive pulmonary disease)   . Lumbar disc disease   . Restless leg syndrome   . Allergic rhinitis   . Hypertension   . Anxiety     anxiety  . Shortness of breath   . Lung mass   . GERD (gastroesophageal reflux disease)   . H/O hiatal hernia   . Pre-operative cardiovascular examination   .  Nonspecific abnormal unspecified cardiovascular function study   . Peripheral vascular disease, unspecified   . Pneumonia 2014    ?   Marland Kitchen On home oxygen therapy     prn  . Constipation   . Cancer 09/14/12    invasive mod diff squamous cell ca    Past Surgical History  Procedure Laterality Date  . Carotid endarterectomy  10/15/2010    left  . Hand surgery      repair of the left long, ring, and small finger after a saw injury  . Video bronchoscopy  07/24/2012    Procedure: VIDEO BRONCHOSCOPY WITH FLUORO;  Surgeon: Kathee Delton, MD;  Location: [facility];  Service: Cardiopulmonary;  Laterality: Bilateral;  . Lobectomy Right 09/14/2012    Procedure: LOBECTOMY;  Surgeon: Ivin Poot, MD;  Location: [place];  Service: Thoracic;  Laterality: Right;  RIGHT UPPER LOBECTOMY  . Video assisted thoracoscopy Right 09/14/2012    Procedure: VIDEO ASSISTED THORACOSCOPY;  Surgeon: Ivin Poot, MD;  Location: [place];  Service: Thoracic;  Laterality: Right;  . Video bronchoscopy with endobronchial ultrasound N/A 08/27/2014    Procedure: VIDEO BRONCHOSCOPY WITH ENDOBRONCHIAL ULTRASOUND;  Surgeon: Ivin Poot, MD;  Location: [place];  Service: Thoracic;  Laterality: N/A;  .  Scalene node biopsy Right 08/27/2014    Procedure: BIOPSY SCALENE NODE;  Surgeon: Ivin Poot, MD;  Location: [place];  Service: Thoracic;  Laterality: Right;  . Sterotactic radiosurgery Right 09/25/14    right parietal 30Gy/1 fx     Current Outpatient Prescriptions  Medication Sig Dispense Refill  . albuterol (PROVENTIL) (2.5 MG/3ML) 0.083% nebulizer solution Take 2.5 mg by nebulization every 6 (six) hours as needed for wheezing or shortness of breath.    Marland Kitchen aspirin EC 81 MG tablet Take 81 mg by mouth daily. Takes one tablet every evening.    . clonazePAM (KLONOPIN) 0.5 MG tablet Take 0.5 mg by mouth 2 (two) times daily as needed for anxiety. Takes one tablet by mouth two times daily as needed for anxiety.    .  Dextromethorphan-Guaifenesin (MUCINEX DM PO) Take 1 tablet by mouth as needed. For congestion    . HYDROcodone-acetaminophen (NORCO) 7.5-325 MG per tablet Take 1 tablet by mouth every 4 (four) hours as needed for pain. 40 tablet 0  . isosorbide mononitrate (IMDUR) 30 MG 24 hr tablet Take 1 tablet (30 mg total) by mouth daily. 90 tablet 3  . LORazepam (ATIVAN) 0.5 MG tablet Take 1 tablet by mouth or sublingually every 8 hours as needed for nausea. May take 1 tablet by mouth at bedtime. 40 tablet 0  . metoprolol tartrate (LOPRESSOR) 25 MG tablet Take 25 mg by mouth as needed. For elevated heart rate    . omeprazole (PRILOSEC) 40 MG capsule Take 40 mg by mouth daily.    . Oxycodone HCl 10 MG TABS Take 1 tablet (10 mg total) by mouth every 4 (four) hours as needed. 60 tablet 0  . pramipexole (MIRAPEX) 1 MG tablet Take 1 mg by mouth at bedtime.    . promethazine-codeine (PHENERGAN WITH CODEINE) 6.25-10 MG/5ML syrup Take 5 mLs by mouth every 6 (six) hours as needed for cough. 120 mL 0  . zolpidem (AMBIEN) 10 MG tablet Take 10 mg by mouth at bedtime as needed for sleep.     No current facility-administered medications for this visit.    Allergies:   Review of patient's allergies indicates no known allergies.    Social History:  The patient  reports that he quit smoking about 2 months ago. His smoking use included Cigarettes. He has a 55 pack-year smoking history. He quit smokeless tobacco use about 62 years ago. He reports that he drinks alcohol. He reports that he does not use illicit drugs.   Family History:  [name] family history includes Alcohol abuse in his mother; Depression in his sister; Heart attack in his brother and father; Heart disease in his brother and father; Stroke in his brother.    ROS:  Please see the history of present illness.   Otherwise, review of systems are positive for tachycardia, dyspnea.   All other systems are reviewed and negative.    PHYSICAL EXAM: VS:  BP  130/70 mmHg  Pulse 100  Ht 5' 9.5" (1.765 m)  Wt 170 lb 1.9 oz (77.166 kg)  BMI 24.77 kg/m2 , BMI Body mass index is 24.77 kg/(m^2). GEN: Well nourished, well developed, in no acute distress HEENT: normal Neck: no JVD, carotid bruits, or masses Cardiac: RRR; no murmurs, rubs, or gallops,no edema  Respiratory:  Bilateral wheezing, coughing GI: soft, nontender, nondistended, + BS MS: no deformity or atrophy Skin: warm and dry, no rash Neuro:  Strength and sensation are intact Psych: euthymic mood, full affect  EKG:   Prior ekg demonstrates from 12/15 sinus tach, no ST segment changes   Recent Labs: 03/07/2014: Pro B Natriuretic peptide (BNP) 18.0 10/16/2014: ALT 52; BUN 8.4; Creatinine 0.8; Hemoglobin 10.2*; Platelets 103*; Potassium 4.0; Sodium 137   Lipid Panel    Component Value Date/Time   CHOL 145 01/17/2011 0320   TRIG 66 01/17/2011 0320   HDL 57 01/17/2011 0320   CHOLHDL 2.5 01/17/2011 0320   VLDL 13 01/17/2011 0320   LDLCALC 75 01/17/2011 0320     Other studies Reviewed: Additional studies/ records that were reviewed today with results demonstrating: Chest CT negative for PE, chest x-ray report reviewed.   ASSESSMENT AND PLAN:  1. CAD: No clear angina at this time. COntinue medical therapy. 2. SHOB: Multifactorial. He stop smoking. I suspect much of his symptoms are respiratory related. BNP was normal in August 2015. He did have a mention of vascular congestion noted on his chest x-ray. We'll check echocardiogram to evaluate LV function as it has been quite some time since his heart function was evaluated..  3. 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 He states BP is usually lowe in the right than in the left. No subclavian steal sx or arm pain with exertion. WOuld continue to monitor.  No clear indication for revascularization at this time. 4. Tachycardia: We'll switch metoprolol to bisoprolol 5 mg by mouth daily when necessary  tachycardia. I encouraged him to stay well hydrated. We'll see what his LV function is.   Current medicines are reviewed at length with the patient today.  The patient concerns regarding his medicines were addressed.  The following changes have been made:  Metoprolol to bisoprolol  Labs/ tests ordered today include: Echocardiogram  No orders of the defined types were placed in this encounter.    Recommend 150 minutes/week of aerobic exercise Low fat, low carb, high fiber diet recommended  Disposition:   FU in 1 year   Teresita Madura., MD  10/17/2014 8:24 AM    [facility] [place] [place], [place], [place]  [address] Phone: [phone]; Fax: [phone]

## 2014-10-17 NOTE — Patient Instructions (Addendum)
STOP TAKING METOPROLOL 25 MG ONCE A DAY   START TAKING BISOPROLOL 5MG  PRN AS NEEDED FOR FAST HEARTRATE   Your physician has requested that you have an echocardiogram. Echocardiography is a painless test that uses sound waves to create images of your heart. It provides your doctor with information about the size and shape of your heart and how well your heart's chambers and valves are working. This procedure takes approximately one hour. There are no restrictions for this procedure.   Your physician wants you to follow-up in:  [place] will receive a reminder letter in the mail two months in advance. If you don't receive a letter, please call our office to schedule the follow-up appointment.

## 2014-10-18 ENCOUNTER — Telehealth: Payer: Self-pay

## 2014-10-18 NOTE — Telephone Encounter (Signed)
Wife called stating her husband is scared and worried. She wants a second opinion to help alleviate his worries. [name] is closer than [facility] for the patient. Their next appt is 4/13.

## 2014-10-18 NOTE — Telephone Encounter (Signed)
Spoke to pt, MD will discuss at next office visit 4/13

## 2014-10-19 NOTE — Telephone Encounter (Signed)
No Problem. We can arrange for 2nd opinion next visit.

## 2014-10-23 ENCOUNTER — Ambulatory Visit (HOSPITAL_BASED_OUTPATIENT_CLINIC_OR_DEPARTMENT_OTHER): Payer: Medicare Other

## 2014-10-23 ENCOUNTER — Ambulatory Visit (HOSPITAL_COMMUNITY): Payer: Medicare Other | Attending: Cardiology | Admitting: Radiology

## 2014-10-23 ENCOUNTER — Encounter: Payer: Self-pay | Admitting: Internal Medicine

## 2014-10-23 ENCOUNTER — Telehealth: Payer: Self-pay | Admitting: Internal Medicine

## 2014-10-23 ENCOUNTER — Ambulatory Visit (HOSPITAL_BASED_OUTPATIENT_CLINIC_OR_DEPARTMENT_OTHER): Payer: Medicare Other | Admitting: Internal Medicine

## 2014-10-23 ENCOUNTER — Other Ambulatory Visit (HOSPITAL_BASED_OUTPATIENT_CLINIC_OR_DEPARTMENT_OTHER): Payer: Medicare Other

## 2014-10-23 VITALS — BP 148/72 | HR 102 | Temp 98.6°F | Resp 18 | Ht 69.5 in | Wt 169.5 lb

## 2014-10-23 DIAGNOSIS — C3411 Malignant neoplasm of upper lobe, right bronchus or lung: Secondary | ICD-10-CM

## 2014-10-23 DIAGNOSIS — C778 Secondary and unspecified malignant neoplasm of lymph nodes of multiple regions: Secondary | ICD-10-CM | POA: Diagnosis not present

## 2014-10-23 DIAGNOSIS — Z5111 Encounter for antineoplastic chemotherapy: Secondary | ICD-10-CM

## 2014-10-23 DIAGNOSIS — C3491 Malignant neoplasm of unspecified part of right bronchus or lung: Secondary | ICD-10-CM

## 2014-10-23 DIAGNOSIS — C7901 Secondary malignant neoplasm of right kidney and renal pelvis: Secondary | ICD-10-CM

## 2014-10-23 DIAGNOSIS — C7971 Secondary malignant neoplasm of right adrenal gland: Secondary | ICD-10-CM

## 2014-10-23 DIAGNOSIS — R51 Headache: Secondary | ICD-10-CM

## 2014-10-23 DIAGNOSIS — R0602 Shortness of breath: Secondary | ICD-10-CM | POA: Insufficient documentation

## 2014-10-23 LAB — CBC WITH DIFFERENTIAL/PLATELET
BASO%: 0.8 % (ref 0.0–2.0)
Basophils Absolute: 0 10*3/uL (ref 0.0–0.1)
EOS ABS: 0.1 10*3/uL (ref 0.0–0.5)
EOS%: 1.8 % (ref 0.0–7.0)
HEMATOCRIT: 31.8 % — AB (ref 38.4–49.9)
HEMOGLOBIN: 10.5 g/dL — AB (ref 13.0–17.1)
LYMPH%: 27.4 % (ref 14.0–49.0)
MCH: 30.5 pg (ref 27.2–33.4)
MCHC: 33.1 g/dL (ref 32.0–36.0)
MCV: 92.2 fL (ref 79.3–98.0)
MONO#: 0.7 10*3/uL (ref 0.1–0.9)
MONO%: 15.4 % — AB (ref 0.0–14.0)
NEUT%: 54.6 % (ref 39.0–75.0)
NEUTROS ABS: 2.5 10*3/uL (ref 1.5–6.5)
PLATELETS: 573 10*3/uL — AB (ref 140–400)
RBC: 3.45 10*6/uL — ABNORMAL LOW (ref 4.20–5.82)
RDW: 14.4 % (ref 11.0–14.6)
WBC: 4.6 10*3/uL (ref 4.0–10.3)
lymph#: 1.3 10*3/uL (ref 0.9–3.3)

## 2014-10-23 LAB — COMPREHENSIVE METABOLIC PANEL (CC13)
ALBUMIN: 3.1 g/dL — AB (ref 3.5–5.0)
ALK PHOS: 93 U/L (ref 40–150)
ALT: 20 U/L (ref 0–55)
AST: 17 U/L (ref 5–34)
Anion Gap: 9 mEq/L (ref 3–11)
BILIRUBIN TOTAL: 0.2 mg/dL (ref 0.20–1.20)
BUN: 6.5 mg/dL — AB (ref 7.0–26.0)
CALCIUM: 8.9 mg/dL (ref 8.4–10.4)
CO2: 28 mEq/L (ref 22–29)
Chloride: 100 mEq/L (ref 98–109)
Creatinine: 0.7 mg/dL (ref 0.7–1.3)
EGFR: >90 mL/min/{1.73_m2} (ref >90)
Glucose: 111 mg/dl (ref 70–140)
Potassium: 3.7 mEq/L (ref 3.5–5.1)
Sodium: 137 mEq/L (ref 136–145)
Total Protein: 6.4 g/dL (ref 6.4–8.3)

## 2014-10-23 MED ORDER — SODIUM CHLORIDE 0.9 % IV SOLN
Freq: Once | INTRAVENOUS | Status: AC
  Administered 2014-10-23: 10:00:00 via INTRAVENOUS

## 2014-10-23 MED ORDER — HYDROCODONE-ACETAMINOPHEN 7.5-325 MG PO TABS: 1.0000 | ORAL_TABLET | Freq: Four times a day (QID) | ORAL | Status: DC | PRN

## 2014-10-23 MED ORDER — TRAMADOL HCL 50 MG PO TABS: 50.0000 mg | ORAL_TABLET | Freq: Two times a day (BID) | ORAL | Status: DC | PRN

## 2014-10-23 MED ORDER — SODIUM CHLORIDE 0.9 % IV SOLN
2000.0000 mg | Freq: Once | INTRAVENOUS | Status: AC
  Administered 2014-10-23: 2000 mg via INTRAVENOUS
  Filled 2014-10-23: qty 52.6

## 2014-10-23 MED ORDER — SODIUM CHLORIDE 0.9 % IV SOLN
502.0000 mg | Freq: Once | INTRAVENOUS | Status: AC
  Administered 2014-10-23: 500 mg via INTRAVENOUS
  Filled 2014-10-23: qty 50

## 2014-10-23 MED ORDER — SODIUM CHLORIDE 0.9 % IV SOLN
Freq: Once | INTRAVENOUS | Status: AC
  Administered 2014-10-23: 10:00:00 via INTRAVENOUS
  Filled 2014-10-23: qty 8

## 2014-10-23 NOTE — Progress Notes (Signed)
[facility] [phone]   [phone]  OFFICE PROGRESS NOTE  Darren Miyamoto, MD [address] [place] [address]  DIAGNOSIS: Metastatic non-small cell lung cancer, squamous cell carcinoma initially diagnosed as Stage IIA (T2b., N0, M0) non-small cell lung cancer, squamous cell carcinoma diagnosed in December of 2013   PRIOR THERAPY:  1) Status post right upper lobectomy under the care of Dr. Prescott Gum on 09/14/2012.  2) Adjuvant chemotherapy with cisplatin 75 mg/M2 and docetaxel 75 mg/M2 with Neulasta support every 3 weeks, status post 1 cycle. Starting with cycle #2, patient will receive carboplatin for an AUC initially of 4.5 and paclitaxel at 175 mg per meter squared with Neulasta support given every 3 weeks. Status post a total of 3 cycles.   CURRENT THERAPY: Systemic chemotherapy with carboplatin for AUC of 5 on day 1 and gemcitabine 1000 MG/M2 on days 1 and 8 every 3 weeks. First dose 09/11/2014. Status post 2 cycles  CHEMOTHERAPY INTENT: Palliative. CURRENT # OF CHEMOTHERAPY CYCLES: 3 CURRENT ANTIEMETICS: Zofran, dexamethasone and Compazine  CURRENT SMOKING STATUS: Quit smoking recently.  ORAL CHEMOTHERAPY AND CONSENT: None  CURRENT BISPHOSPHONATES USE: None  PAIN MANAGEMENT: 3/10 controlled by Norco when necessary  NARCOTICS INDUCED CONSTIPATION: Milk of magnesia on as-needed basis.  LIVING WILL AND CODE STATUS: No CODE BLUE.   INTERVAL HISTORY:  [name] 74 y.o. male returns to the clinic today for followup visit accompanied his wife. The patient is currently undergoing systemic chemotherapy with carboplatin and gemcitabine status post 2 cycles. He is tolerating his treatment well except for fatigue as well as the baseline shortness of breath. He is currently on home oxygen but doesn't use it all the time. He denied having any significant chest pain but has mild cough and no hemoptysis. He quit smoking recently. He denied  having any significant nausea or vomiting, no fever or chills, no weight loss or night sweats. He has intermittent headache and requested treatment with tramadol as it did help him when he took some from his wife. He has recent CT scan of the chest after cycle #2 that showed no evidence for disease progression. He is here today to start cycle #3 of his chemotherapy.  MEDICAL HISTORY: Past Medical History  Diagnosis Date  . Arthritis   . Wheezing   . Sore throat   . Carotid artery occlusion   . COPD (chronic obstructive pulmonary disease)   . Lumbar disc disease   . Restless leg syndrome   . Allergic rhinitis   . Hypertension   . Anxiety     anxiety  . Shortness of breath   . Lung mass   . GERD (gastroesophageal reflux disease)   . H/O hiatal hernia   . Pre-operative cardiovascular examination   . Nonspecific abnormal unspecified cardiovascular function study   . Peripheral vascular disease, unspecified   . Pneumonia 2014    ?   Marland Kitchen On home oxygen therapy     prn  . Constipation   . Cancer 09/14/12    invasive mod diff squamous cell ca    ALLERGIES:  has No Known Allergies.  MEDICATIONS:  Current Outpatient Prescriptions  Medication Sig Dispense Refill  . albuterol (PROVENTIL) (2.5 MG/3ML) 0.083% nebulizer solution Take 2.5 mg by nebulization every 6 (six) hours as needed for wheezing or shortness of breath.    Marland Kitchen aspirin EC 81 MG tablet Take 81 mg by mouth daily. Takes  one tablet every evening.    . clonazePAM (KLONOPIN) 0.5 MG tablet Take 0.5 mg by mouth 2 (two) times daily as needed for anxiety. Takes one tablet by mouth two times daily as needed for anxiety.    Marland Kitchen HYDROcodone-acetaminophen (NORCO) 7.5-325 MG per tablet Take 1 tablet by mouth every 4 (four) hours as needed for pain. 40 tablet 0  . isosorbide mononitrate (IMDUR) 30 MG 24 hr tablet Take 1 tablet (30 mg total) by mouth daily. 90 tablet 3  . LORazepam (ATIVAN) 0.5 MG tablet Take 1 tablet by mouth or sublingually  every 8 hours as needed for nausea. May take 1 tablet by mouth at bedtime. 40 tablet 0  . omeprazole (PRILOSEC) 40 MG capsule Take 40 mg by mouth daily.    . pramipexole (MIRAPEX) 1 MG tablet Take 1 mg by mouth at bedtime.    . promethazine-codeine (PHENERGAN WITH CODEINE) 6.25-10 MG/5ML syrup Take 5 mLs by mouth every 6 (six) hours as needed for cough. 120 mL 0  . zolpidem (AMBIEN) 10 MG tablet Take 10 mg by mouth at bedtime as needed for sleep.    . bisoprolol (ZEBETA) 5 MG tablet Take 1 tablet (5 mg total) by mouth daily. (Patient taking differently: Take 5 mg by mouth daily. TAKE AS NEEDED FOR FAST HEART RATE) 30 tablet 11  . metoprolol tartrate (LOPRESSOR) 25 MG tablet Take 25 mg by mouth daily.    . Oxycodone HCl 10 MG TABS Take 1 tablet (10 mg total) by mouth every 4 (four) hours as needed. (Patient not taking: Reported on 10/23/2014) 60 tablet 0   No current facility-administered medications for this visit.    SURGICAL HISTORY:  Past Surgical History  Procedure Laterality Date  . Carotid endarterectomy  10/15/2010    left  . Hand surgery      repair of the left long, ring, and small finger after a saw injury  . Video bronchoscopy  07/24/2012    Procedure: VIDEO BRONCHOSCOPY WITH FLUORO;  Surgeon: Kathee Delton, MD;  Location: [facility];  Service: Cardiopulmonary;  Laterality: Bilateral;  . Lobectomy Right 09/14/2012    Procedure: LOBECTOMY;  Surgeon: Ivin Poot, MD;  Location: [facility];  Service: Thoracic;  Laterality: Right;  RIGHT UPPER LOBECTOMY  . Video assisted thoracoscopy Right 09/14/2012    Procedure: VIDEO ASSISTED THORACOSCOPY;  Surgeon: Ivin Poot, MD;  Location: [facility];  Service: Thoracic;  Laterality: Right;  . Video bronchoscopy with endobronchial ultrasound N/A 08/27/2014    Procedure: VIDEO BRONCHOSCOPY WITH ENDOBRONCHIAL ULTRASOUND;  Surgeon: Ivin Poot, MD;  Location: [facility];  Service: Thoracic;  Laterality: N/A;  . Scalene node biopsy Right 08/27/2014     Procedure: BIOPSY SCALENE NODE;  Surgeon: Ivin Poot, MD;  Location: [facility];  Service: Thoracic;  Laterality: Right;  . Sterotactic radiosurgery Right 09/25/14    right parietal 30Gy/1 fx    REVIEW OF SYSTEMS:  Constitutional: positive for fatigue Eyes: negative Ears, nose, mouth, throat, and face: negative Respiratory: positive for cough and dyspnea on exertion Cardiovascular: negative Gastrointestinal: negative Genitourinary:negative Integument/breast: negative Hematologic/lymphatic: negative Musculoskeletal:negative Neurological: negative Behavioral/Psych: negative Endocrine: negative Allergic/Immunologic: negative   PHYSICAL EXAMINATION: General appearance: alert, cooperative and no distress Head: Normocephalic, without obvious abnormality, atraumatic Neck: no adenopathy, no JVD, supple, symmetrical, trachea midline and thyroid not enlarged, symmetric, no tenderness/mass/nodules Lymph nodes: Cervical, supraclavicular, and axillary nodes normal. Resp: wheezes bilaterally Back: symmetric, no curvature. ROM normal. No CVA tenderness. Cardio: regular rate  and rhythm, S1, S2 normal, no murmur, click, rub or gallop GI: soft, non-tender; bowel sounds normal; no masses,  no organomegaly Extremities: extremities normal, atraumatic, no cyanosis or edema Neurologic: Alert and oriented X 3, normal strength and tone. Normal symmetric reflexes. Normal coordination and gait  ECOG PERFORMANCE STATUS: 1 - Symptomatic but completely ambulatory  Blood pressure 148/72, pulse 102, temperature 98.6 F (37 C), temperature source Oral, resp. rate 18, height 5' 9.5" (1.765 m), weight 169 lb 8 oz (76.885 kg), SpO2 96 %.  LABORATORY DATA: Lab Results  Component Value Date   WBC 4.6 10/23/2014   HGB 10.5* 10/23/2014   HCT 31.8* 10/23/2014   MCV 92.2 10/23/2014   PLT 573* 10/23/2014      Chemistry      Component Value Date/Time   NA 137 10/23/2014 0827   NA 137 09/30/2014 0210   K 3.7  10/23/2014 0827   K 3.2* 09/30/2014 0210   CL 95* 09/30/2014 0210   CL 101 12/28/2012 0852   CO2 28 10/23/2014 0827   CO2 31 09/30/2014 0210   BUN 6.5* 10/23/2014 0827   BUN 8 09/30/2014 0210   CREATININE 0.7 10/23/2014 0827   CREATININE 0.80 09/30/2014 0210      Component Value Date/Time   CALCIUM 8.9 10/23/2014 0827   CALCIUM 8.3* 09/30/2014 0210   ALKPHOS 93 10/23/2014 0827   ALKPHOS 124* 09/30/2014 0210   AST 17 10/23/2014 0827   AST 30 09/30/2014 0210   ALT 20 10/23/2014 0827   ALT 51 09/30/2014 0210   BILITOT 0.20 10/23/2014 0827   BILITOT 0.2* 09/30/2014 0210       RADIOGRAPHIC STUDIES: Dg Chest 2 View  09/30/2014   CLINICAL DATA:  Dyspnea, worse than usual  EXAM: CHEST  2 VIEW  COMPARISON:  08/27/2014, 05/08/1949  FINDINGS: There is mild vascular and interstitial prominence, particularly on comparison with the 05/08/2014 examination. This may indicate a degree of mild congestive heart failure. There is no frank alveolar edema. There is no effusion. There is a nodular opacity in the left lateral lung base which is new and worrisome for progression of neoplastic disease. The opacities in the right lateral lung base represent chronic healed fracture deformities of the ribs. There are prominent hilar contours bilaterally, likely adenopathy, not significantly changed.  IMPRESSION: Mild vascular and interstitial prominence, suggesting a component of mild congestive heart failure. There probably also is progression of neoplastic disease, with a new nodular opacity in the lateral left base.   Electronically Signed   By: Andreas Newport M.D.   On: 09/30/2014 02:33   Ct Angio Chest Pe W/cm &/or Wo Cm  10/07/2014   ADDENDUM REPORT: 10/07/2014 15:59  ADDENDUM: A more recent PET-CT examination has become available, dated August 10, 2014. In comparison of the current CT examination to the prior PET-CT, no new lesions are appreciable currently. Indeed, there is slightly less infiltrate in  the right base posteriorly compared to recent PET study. The more well-defined nodular opacities remain essentially stable as does the degree of scarring and interstitial fibrosis. The degree of adenopathy is also stable as is the right adrenal mass described on the current examination. No new lesions are appreciable compared to the January 2016 study.   Electronically Signed   By: Lowella Grip III M.D.   On: 10/07/2014 15:59   10/07/2014   CLINICAL DATA:  Progressive shortness of breath. History of right-sided lung carcinoma  EXAM: CT ANGIOGRAPHY CHEST WITH CONTRAST  TECHNIQUE:  Multidetector CT imaging of the chest was performed using the standard protocol during bolus administration of intravenous contrast. Multiplanar CT image reconstructions and MIPs were obtained to evaluate the vascular anatomy.  CONTRAST:  161mL OMNIPAQUE IOHEXOL 350 MG/ML SOLN  COMPARISON:  Chest CT January 31, 2014; chest radiograph September 30, 2014  FINDINGS: The bolus timing for optimization of pulmonary arterial visualization is less than optimal. No pulmonary embolus is seen in the major pulmonary arterial vessels. There is atherosclerotic change in aorta. No aneurysm or dissection is appreciable.  There is underlying centrilobular emphysema. The patient has had a right upper lobectomy. There is scarring in the right apical region There is right apical pleural thickening. In the right apical region posteriorly, there is a new nodular area measuring 1.1 x 1.1 cm. There is a nodular lesion in the medial left apex measuring 0.7 x 0.9 cm. There are several small nodular lesions in the anterior segment left upper lobe, largest measuring 0.6 x 0.7 cm. There is a new nodular lesion in the right upper lung zone in an area of scarring measuring 0.9 x 0.7 cm. There is a nodular lesion in the superior segment of the right lower lobe measuring 1.5 x 1.2 cm. There is a nodular lesion in the posterior segment of the right lower lobe measuring 1.3 x  1.0 cm. On the left laterally, there are several irregular nodular lesions abutting the pleura. There is an area of confluent opacity in the posterior left base of measuring 3.2 x 1.6 cm. There is a nearby nodular opacity in the left base near the junction of the posterior lateral segments measuring 0.9 x 0.9 cm. There are areas of interstitial fibrosis in each lung base, progressed.  There is adenopathy in the mediastinum with multiple prominent lymph nodes not present on prior CT examination. Just to the left of the carina, there is a focal lymph node measuring 1.7 x 1.0 cm. There is a lymph node in the aortopulmonary window region measuring 1.8 x 1.2 cm. There is a sub- carinal lymph node measuring 1.7 x 1.5 cm. There are small lymph nodes in each hilar region. There is a prominent right axillary lymph node measuring 2.5 x 1.5 cm, not present previously.  The pericardium is not thickened. There are foci of coronary artery calcification. Thyroid appears unremarkable.  In the visualized upper abdomen, there is a new mass in the right adrenal measuring 1.4 x 1.4 cm. There is incomplete visualization of the left adrenal. There is hepatic steatosis.  Review of the MIP images confirms the above findings.  IMPRESSION: No demonstrable pulmonary embolus. Timing bolus for visualization of the pulmonary arteries is suboptimal. Small peripheral pulmonary emboli could be obscured on this study.  Evidence suggesting pulmonary metastases. There is mild adenopathy at multiple sites, new. Right adrenal masses also new.  Scarring right upper lung zone, status post right lobectomy.  New areas of interstitial fibrosis in the lung bases. Underlying emphysematous change.  Electronically Signed: By: Lowella Grip III M.D. On: 10/07/2014 15:15   Ct Renal Stone Study  09/30/2014   CLINICAL DATA:  Acute onset of right flank pain and increased urinary frequency. Shortness of breath. Patient on chemotherapy and recent radiation  therapy for lung cancer. Initial encounter.  EXAM: CT ABDOMEN AND PELVIS WITHOUT CONTRAST  TECHNIQUE: Multidetector CT imaging of the abdomen and pelvis was performed following the standard protocol without IV contrast.  COMPARISON:  PET/CT performed 08/10/2014, and abdominal ultrasound performed 03/29/2012  FINDINGS:  Patchy bibasilar airspace opacities are mildly improved from the prior study, with underlying architectural distortion again noted at the anterior right lung base. This likely reflects interval improvement in lymphangitic spread of tumor. Diffuse coronary artery calcifications are seen.  The liver is unremarkable in appearance. A calcified granuloma is noted within the spleen. The gallbladder is within normal limits. The pancreas and adrenal glands are unremarkable.  Nonspecific perinephric stranding is noted bilaterally, more prominent on the right, new from the prior study. This raises concern for underlying pyelonephritis. Minimal right-sided pelvicaliectasis remains within normal limits. There is no evidence of hydronephrosis. No renal or ureteral stones are identified.  No free fluid is identified. The small bowel is unremarkable in appearance. The stomach is within normal limits. No acute vascular abnormalities are seen. Scattered calcification is seen along the abdominal aorta and its branches, with dense calcification seen at the origins of both renal arteries. There is mild ectasia of the distal abdominal aorta, without evidence of aneurysmal dilatation.  Prominent calcification is seen along the common femoral arteries and their branches bilaterally.  The appendix is normal in caliber, without evidence of appendicitis. The colon is unremarkable in appearance.  The bladder is mildly distended and grossly unremarkable. A small urachal remnant is incidentally noted. The prostate remains normal in size. No inguinal lymphadenopathy is seen.  No acute osseous abnormalities are identified. There is  grade 1 anterolisthesis of L5 on S1, reflecting underlying bilateral pars defects at L5. Multilevel vacuum phenomenon and disc space narrowing are noted along the lumbar spine, with endplate sclerosis.  IMPRESSION: 1. New bilateral perinephric stranding, more prominent on the right, raises concern for pyelonephritis, given the patient's symptoms. No evidence of hydronephrosis. No obstructing ureteral stone seen. 2. Patchy bibasilar airspace opacities are mildly improved, with architectural distortion again noted at the right lung base. This likely reflects interval improvement in lymphangitic spread of tumor. 3. Diffuse coronary artery calcifications again seen. 4. Scattered calcification along the abdominal aorta and its branches, with dense calcification at the origins of both renal arteries. 5. Prominent calcification along the common femoral arteries and their branches bilaterally. 6. Grade 1 anterolisthesis of L5 on S1, reflecting underlying bilateral pars defects at L5. Mild degenerative change noted along the lumbar spine.   Electronically Signed   By: Garald Balding M.D.   On: 09/30/2014 02:34    ASSESSMENT AND PLAN: This is a very pleasant 74 years old white male with history of stage IIa non-small cell lung cancer status post right upper lobectomy followed by 4 cycles of adjuvant chemotherapy.  He now presented with evidence for significant disease recurrence including lymphangitic spread of the tumor in addition to bilateral hilar and mediastinal lymphadenopathy in addition to right axillary or right subpectoral and right supraclavicular lymphadenopathy and right sided adrenal metastasis. He also has a very small brain lesion measuring 2 mm. The patient is currently undergoing systemic chemotherapy with carboplatin and gemcitabine status post 2 cycles and tolerating the treatment well except for fatigue. His most recent CT scan of the chest showed no evidence for disease progression. I recommended  for the patient to proceed with cycle #3 of his chemotherapy today as scheduled. The patient would come back for follow-up visit in 3 weeks with the next cycle of his treatment. For the headache, I gave the patient a refill of and I also started him on tramadol 50 mg by mouth 2 times a day as needed. He was advised to call immediately if he  has any concerning symptoms in the interval.  The patient voices understanding of current disease status and treatment options and is in agreement with the current care plan.  All questions were answered. The patient knows to call the clinic with any problems, questions or concerns. We can certainly see the patient much sooner if necessary.  Disclaimer: This note was dictated with voice recognition software. Similar sounding words can inadvertently be transcribed and may not be corrected upon review.

## 2014-10-23 NOTE — Patient Instructions (Signed)
[facility] Discharge Instructions for Patients Receiving Chemotherapy  Today you received the following chemotherapy agents: gemzar, carboplatin  To help prevent nausea and vomiting after your treatment, we encourage you to take your nausea medication.  Take it as often as prescribed.     If you develop nausea and vomiting that is not controlled by your nausea medication, call the clinic. If it is after clinic hours your family physician or the after hours number for the clinic or go to the Emergency Department.   BELOW ARE SYMPTOMS THAT SHOULD BE REPORTED IMMEDIATELY:  *FEVER GREATER THAN 100.5 F  *CHILLS WITH OR WITHOUT FEVER  NAUSEA AND VOMITING THAT IS NOT CONTROLLED WITH YOUR NAUSEA MEDICATION  *UNUSUAL SHORTNESS OF BREATH  *UNUSUAL BRUISING OR BLEEDING  TENDERNESS IN MOUTH AND THROAT WITH OR WITHOUT PRESENCE OF ULCERS  *URINARY PROBLEMS  *BOWEL PROBLEMS  UNUSUAL RASH Items with * indicate a potential emergency and should be followed up as soon as possible.  Feel free to call the clinic you have any questions or concerns. The clinic phone number is [phone].   I have been informed and understand all the instructions given to me. I know to contact the clinic, my physician, or go to the Emergency Department if any problems should occur. I do not have any questions at this time, but understand that I may call the clinic during office hours   should I have any questions or need assistance in obtaining follow up care.    __________________________________________  _____________  __________ Signature of Patient or Authorized Representative            Date                   Time    __________________________________________ Nurse's Signature

## 2014-10-23 NOTE — Telephone Encounter (Signed)
gave and printd appt sched and avs for pt for April and May....sed added tx. emailed MD if ok to sched with LT

## 2014-10-23 NOTE — Progress Notes (Signed)
Echocardiogram performed.  

## 2014-10-24 ENCOUNTER — Encounter: Payer: Self-pay | Admitting: Internal Medicine

## 2014-10-30 ENCOUNTER — Ambulatory Visit (HOSPITAL_BASED_OUTPATIENT_CLINIC_OR_DEPARTMENT_OTHER): Payer: Medicare Other

## 2014-10-30 ENCOUNTER — Other Ambulatory Visit (HOSPITAL_BASED_OUTPATIENT_CLINIC_OR_DEPARTMENT_OTHER): Payer: Medicare Other

## 2014-10-30 ENCOUNTER — Other Ambulatory Visit: Payer: Self-pay | Admitting: Radiation Therapy

## 2014-10-30 ENCOUNTER — Ambulatory Visit: Admission: RE | Admit: 2014-10-30 | Payer: Medicare Other | Source: Ambulatory Visit | Admitting: Radiation Oncology

## 2014-10-30 VITALS — BP 138/79 | HR 87 | Temp 97.8°F | Resp 18

## 2014-10-30 DIAGNOSIS — C7931 Secondary malignant neoplasm of brain: Secondary | ICD-10-CM

## 2014-10-30 DIAGNOSIS — C3400 Malignant neoplasm of unspecified main bronchus: Secondary | ICD-10-CM

## 2014-10-30 DIAGNOSIS — C3411 Malignant neoplasm of upper lobe, right bronchus or lung: Secondary | ICD-10-CM | POA: Diagnosis present

## 2014-10-30 DIAGNOSIS — Z5111 Encounter for antineoplastic chemotherapy: Secondary | ICD-10-CM | POA: Diagnosis present

## 2014-10-30 LAB — CBC WITH DIFFERENTIAL/PLATELET
BASO%: 4.2 % — ABNORMAL HIGH (ref 0.0–2.0)
BASOS ABS: 0.1 10*3/uL (ref 0.0–0.1)
EOS ABS: 0 10*3/uL (ref 0.0–0.5)
EOS%: 0.4 % (ref 0.0–7.0)
HCT: 29.7 % — ABNORMAL LOW (ref 38.4–49.9)
HEMOGLOBIN: 10.1 g/dL — AB (ref 13.0–17.1)
LYMPH%: 60.4 % — ABNORMAL HIGH (ref 14.0–49.0)
MCH: 30.9 pg (ref 27.2–33.4)
MCHC: 33.9 g/dL (ref 32.0–36.0)
MCV: 91.1 fL (ref 79.3–98.0)
MONO#: 0.2 10*3/uL (ref 0.1–0.9)
MONO%: 8 % (ref 0.0–14.0)
NEUT%: 27 % — ABNORMAL LOW (ref 39.0–75.0)
NEUTROS ABS: 0.7 10*3/uL — AB (ref 1.5–6.5)
PLATELETS: 685 10*3/uL — AB (ref 140–400)
RBC: 3.26 10*6/uL — ABNORMAL LOW (ref 4.20–5.82)
RDW: 15.1 % — ABNORMAL HIGH (ref 11.0–14.6)
WBC: 2.4 10*3/uL — AB (ref 4.0–10.3)
lymph#: 1.5 10*3/uL (ref 0.9–3.3)

## 2014-10-30 LAB — COMPREHENSIVE METABOLIC PANEL (CC13)
ALT: 52 U/L (ref 0–55)
ANION GAP: 12 meq/L — AB (ref 3–11)
AST: 32 U/L (ref 5–34)
Albumin: 3.4 g/dL — ABNORMAL LOW (ref 3.5–5.0)
Alkaline Phosphatase: 84 U/L (ref 40–150)
BUN: 6.7 mg/dL — AB (ref 7.0–26.0)
CO2: 25 mEq/L (ref 22–29)
Calcium: 9.4 mg/dL (ref 8.4–10.4)
Chloride: 100 mEq/L (ref 98–109)
Creatinine: 0.7 mg/dL (ref 0.7–1.3)
EGFR: >90 mL/min/{1.73_m2} (ref >90)
Glucose: 88 mg/dl (ref 70–140)
Potassium: 3.6 mEq/L (ref 3.5–5.1)
SODIUM: 136 meq/L (ref 136–145)
TOTAL PROTEIN: 6.7 g/dL (ref 6.4–8.3)

## 2014-10-30 MED ORDER — PEGFILGRASTIM 6 MG/0.6ML ~~LOC~~ PSKT
6.0000 mg | PREFILLED_SYRINGE | Freq: Once | SUBCUTANEOUS | Status: AC
  Administered 2014-10-30: 6 mg via SUBCUTANEOUS
  Filled 2014-10-30: qty 0.6

## 2014-10-30 MED ORDER — PROCHLORPERAZINE MALEATE 10 MG PO TABS
ORAL_TABLET | ORAL | Status: AC
  Filled 2014-10-30: qty 1

## 2014-10-30 MED ORDER — SODIUM CHLORIDE 0.9 % IV SOLN
1000.0000 mg/m2 | Freq: Once | INTRAVENOUS | Status: AC
  Administered 2014-10-30: 1976 mg via INTRAVENOUS
  Filled 2014-10-30: qty 51.97

## 2014-10-30 MED ORDER — PROCHLORPERAZINE MALEATE 10 MG PO TABS
10.0000 mg | ORAL_TABLET | Freq: Once | ORAL | Status: AC
  Administered 2014-10-30: 10 mg via ORAL

## 2014-10-30 MED ORDER — SODIUM CHLORIDE 0.9 % IV SOLN
Freq: Once | INTRAVENOUS | Status: AC
  Administered 2014-10-30: 10:00:00 via INTRAVENOUS

## 2014-10-30 NOTE — Patient Instructions (Addendum)
[facility] Discharge Instructions for Patients Receiving Chemotherapy  Today you received the following chemotherapy agents Gemzar.  To help prevent nausea and vomiting after your treatment, we encourage you to take your nausea medication as prescribed.   If you develop nausea and vomiting that is not controlled by your nausea medication, call the clinic.   BELOW ARE SYMPTOMS THAT SHOULD BE REPORTED IMMEDIATELY:  *FEVER GREATER THAN 100.5 F  *CHILLS WITH OR WITHOUT FEVER  NAUSEA AND VOMITING THAT IS NOT CONTROLLED WITH YOUR NAUSEA MEDICATION  *UNUSUAL SHORTNESS OF BREATH  *UNUSUAL BRUISING OR BLEEDING  TENDERNESS IN MOUTH AND THROAT WITH OR WITHOUT PRESENCE OF ULCERS  *URINARY PROBLEMS  *BOWEL PROBLEMS  UNUSUAL RASH Items with * indicate a potential emergency and should be followed up as soon as possible.  Feel free to call the clinic you have any questions or concerns. The clinic phone number is [phone].  Please show the [facility] at check-in to the Emergency Department and triage nurse.  Pegfilgrastim injection What is this medicine? PEGFILGRASTIM (peg fil GRA stim) is a long-acting granulocyte colony-stimulating factor that stimulates the growth of neutrophils, a type of white blood cell important in the body's fight against infection. It is used to reduce the incidence of fever and infection in patients with certain types of cancer who are receiving chemotherapy that affects the bone marrow. This medicine may be used for other purposes; ask your health care provider or pharmacist if you have questions. COMMON BRAND NAME(S): Neulasta What should I tell my health care provider before I take this medicine? They need to know if you have any of these conditions: -latex allergy -ongoing radiation therapy -sickle cell disease -skin reactions to acrylic adhesives (On-Body Injector only) -an unusual or allergic reaction to pegfilgrastim,  filgrastim, other medicines, foods, dyes, or preservatives -pregnant or trying to get pregnant -breast-feeding How should I use this medicine? This medicine is for injection under the skin. If you get this medicine at home, you will be taught how to prepare and give the pre-filled syringe or how to use the On-body Injector. Refer to the patient Instructions for Use for detailed instructions. Use exactly as directed. Take your medicine at regular intervals. Do not take your medicine more often than directed. It is important that you put your used needles and syringes in a special sharps container. Do not put them in a trash can. If you do not have a sharps container, call your pharmacist or healthcare provider to get one. Talk to your pediatrician regarding the use of this medicine in children. Special care may be needed. Overdosage: If you think you have taken too much of this medicine contact a poison control center or emergency room at once. NOTE: This medicine is only for you. Do not share this medicine with others. What if I miss a dose? It is important not to miss your dose. Call your doctor or health care professional if you miss your dose. If you miss a dose due to an On-body Injector failure or leakage, a new dose should be administered as soon as possible using a single prefilled syringe for manual use. What may interact with this medicine? Interactions have not been studied. Give your health care provider a list of all the medicines, herbs, non-prescription drugs, or dietary supplements you use. Also tell them if you smoke, drink alcohol, or use illegal drugs. Some items may interact with your medicine. This list may not describe all possible  interactions. Give your health care provider a list of all the medicines, herbs, non-prescription drugs, or dietary supplements you use. Also tell them if you smoke, drink alcohol, or use illegal drugs. Some items may interact with your medicine. What  should I watch for while using this medicine? You may need blood work done while you are taking this medicine. If you are going to need a MRI, CT scan, or other procedure, tell your doctor that you are using this medicine (On-Body Injector only). What side effects may I notice from receiving this medicine? Side effects that you should report to your doctor or health care professional as soon as possible: -allergic reactions like skin rash, itching or hives, swelling of the face, lips, or tongue -dizziness -fever -pain, redness, or irritation at site where injected -pinpoint red spots on the skin -shortness of breath or breathing problems -stomach or side pain, or pain at the shoulder -swelling -tiredness -trouble passing urine Side effects that usually do not require medical attention (report to your doctor or health care professional if they continue or are bothersome): -bone pain -muscle pain This list may not describe all possible side effects. Call your doctor for medical advice about side effects. You may report side effects to FDA at 1-800-FDA-1088. Where should I keep my medicine? Keep out of the reach of children. Store pre-filled syringes in a refrigerator between 2 and 8 degrees C (36 and 46 degrees F). Do not freeze. Keep in carton to protect from light. Throw away this medicine if it is left out of the refrigerator for more than 48 hours. Throw away any unused medicine after the expiration date. NOTE: This sheet is a summary. It may not cover all possible information. If you have questions about this medicine, talk to your doctor, pharmacist, or health care provider.  2015, Elsevier/Gold Standard. (2013-09-27 16:14:05)

## 2014-10-30 NOTE — Progress Notes (Signed)
Labs reviewed with MD, Pt ok to treat per MD, order for Neulasta Onpro entered.

## 2014-10-30 NOTE — Progress Notes (Signed)
Pt and caregiver reviewed teaching video on OnPro.

## 2014-11-04 ENCOUNTER — Inpatient Hospital Stay: Admission: RE | Admit: 2014-11-04 | Payer: Self-pay | Source: Ambulatory Visit | Admitting: Radiation Oncology

## 2014-11-06 ENCOUNTER — Ambulatory Visit: Payer: Medicare Other | Admitting: Family

## 2014-11-06 ENCOUNTER — Other Ambulatory Visit (HOSPITAL_COMMUNITY): Payer: Medicare Other

## 2014-11-06 ENCOUNTER — Telehealth: Payer: Self-pay | Admitting: *Deleted

## 2014-11-06 ENCOUNTER — Other Ambulatory Visit (HOSPITAL_BASED_OUTPATIENT_CLINIC_OR_DEPARTMENT_OTHER): Payer: Medicare Other

## 2014-11-06 DIAGNOSIS — C3411 Malignant neoplasm of upper lobe, right bronchus or lung: Secondary | ICD-10-CM | POA: Diagnosis present

## 2014-11-06 DIAGNOSIS — C7971 Secondary malignant neoplasm of right adrenal gland: Secondary | ICD-10-CM | POA: Diagnosis not present

## 2014-11-06 LAB — CBC WITH DIFFERENTIAL/PLATELET
BASO%: 0.5 % (ref 0.0–2.0)
Basophils Absolute: 0.1 10*3/uL (ref 0.0–0.1)
EOS%: 0.1 % (ref 0.0–7.0)
Eosinophils Absolute: 0 10*3/uL (ref 0.0–0.5)
HEMATOCRIT: 28.4 % — AB (ref 38.4–49.9)
HGB: 9 g/dL — ABNORMAL LOW (ref 13.0–17.1)
LYMPH#: 1.8 10*3/uL (ref 0.9–3.3)
LYMPH%: 10.5 % — ABNORMAL LOW (ref 14.0–49.0)
MCH: 29.2 pg (ref 27.2–33.4)
MCHC: 31.7 g/dL — ABNORMAL LOW (ref 32.0–36.0)
MCV: 92.1 fL (ref 79.3–98.0)
MONO#: 0.7 10*3/uL (ref 0.1–0.9)
MONO%: 3.8 % (ref 0.0–14.0)
NEUT#: 14.6 10*3/uL — ABNORMAL HIGH (ref 1.5–6.5)
NEUT%: 85.1 % — ABNORMAL HIGH (ref 39.0–75.0)
Platelets: 107 10*3/uL — ABNORMAL LOW (ref 140–400)
RBC: 3.08 10*6/uL — ABNORMAL LOW (ref 4.20–5.82)
RDW: 15.6 % — ABNORMAL HIGH (ref 11.0–14.6)
WBC: 17.1 10*3/uL — ABNORMAL HIGH (ref 4.0–10.3)

## 2014-11-06 LAB — COMPREHENSIVE METABOLIC PANEL (CC13)
ALBUMIN: 3.3 g/dL — AB (ref 3.5–5.0)
ALK PHOS: 134 U/L (ref 40–150)
ALT: 46 U/L (ref 0–55)
AST: 26 U/L (ref 5–34)
Anion Gap: 11 mEq/L (ref 3–11)
BUN: 5.7 mg/dL — AB (ref 7.0–26.0)
CALCIUM: 8.8 mg/dL (ref 8.4–10.4)
CHLORIDE: 98 meq/L (ref 98–109)
CO2: 27 mEq/L (ref 22–29)
Creatinine: 0.8 mg/dL (ref 0.7–1.3)
EGFR: >90 mL/min/{1.73_m2} (ref >90)
GLUCOSE: 97 mg/dL (ref 70–140)
Potassium: 3.7 mEq/L (ref 3.5–5.1)
Sodium: 136 mEq/L (ref 136–145)
Total Bilirubin: 0.26 mg/dL (ref 0.20–1.20)
Total Protein: 6.3 g/dL — ABNORMAL LOW (ref 6.4–8.3)

## 2014-11-06 NOTE — Telephone Encounter (Signed)
Returned call to Mrs. Stann Mainland ,she is aware Dr. Lisbeth Renshaw out of office all week, and Mont Dutton is in another office this am will be back later today and she can wait to hear from [name] the brain navigator, wife wants MRI scheduled sooner, husbands having a lot of head aches 9:22 AM

## 2014-11-07 ENCOUNTER — Encounter: Payer: Self-pay | Admitting: Radiation Therapy

## 2014-11-07 ENCOUNTER — Telehealth: Payer: Self-pay | Admitting: *Deleted

## 2014-11-07 NOTE — Progress Notes (Signed)
Thayer Headings asked that I refer [name] to the ED due to his SOB and increased WBC. I called and spoke with his wife to communicate this recommendation. She shared that he recently received neulasta which could explain the increased WBC. She is not interested in taking him to the ED and waiting around unless it is advised by [name].        I reached out to [facility] about this patient and she is going to bring him in for a visit with [name] for a symptom management eval and go from there.   Mont Dutton R.T.(R)(T) [facility]   Radiation Oncology  Radiation Special Procedures Navigator Office: [phone]   Pager: [phone]  [place] Website: [email or web address]

## 2014-11-07 NOTE — Progress Notes (Signed)
Mr. Grewe wife called to request his brain MRI be moved up from June to now due to frequent worsening headaches. I have changed the MRI and follow-up with Dr. Lisbeth Renshaw as requested, but when I was on the phone with the patient to inform him of these changes, I noticed the was having a very hard time talking and unable to catch  his breath. He explained that his breathing has become more difficult over the past week. He is not able to walk across his house without help and taking a moment to rest to catch his breath. He also feels that his heart rate has been elevated due to this struggle to breathe. While we were on the phone he measured his Heart rate at 120 BPM and O2 stat as 90% with 2 liters of oxygen on.       He is unable to lay flat and has been sleeping on the couch in a seated position for about a month. When asked if he has noticed any swelling in his face or neck and if his SOB is worse in the morning, he responded that he feels that his face is, " a little puffy", and that his breathing gets progressively worse as the day goes on.       I let him know that I plan to send this message to his physicians including [name]. He asked that I also let everyone know that he had labs drawn yesterday and requested that those be reviewed as well.   Mont Dutton

## 2014-11-07 NOTE — Telephone Encounter (Signed)
I received information from Mont Dutton that patient had elevated WBC's and SOB.  I review information with [name].  He stated normal elevation with recent medication.  He stated that if SOB is worse than to go to ED.    I called and spoke with wife.  She stated he is breathing better as he is sitting outside.  I updated her on what [name] stated.  She stated he is ok at this time so need to go to hospital.  I asked that she call back if needed.

## 2014-11-08 ENCOUNTER — Encounter: Payer: Self-pay | Admitting: Internal Medicine

## 2014-11-11 ENCOUNTER — Inpatient Hospital Stay (HOSPITAL_COMMUNITY): Payer: Medicare Other

## 2014-11-11 ENCOUNTER — Inpatient Hospital Stay (HOSPITAL_BASED_OUTPATIENT_CLINIC_OR_DEPARTMENT_OTHER)
Admission: EM | Admit: 2014-11-11 | Discharge: 2014-11-14 | DRG: 871 | Disposition: A | Discharge status: Home / Self Care | Payer: Medicare Other | Attending: Internal Medicine | Admitting: Internal Medicine

## 2014-11-11 ENCOUNTER — Encounter (HOSPITAL_BASED_OUTPATIENT_CLINIC_OR_DEPARTMENT_OTHER): Payer: Self-pay | Admitting: *Deleted

## 2014-11-11 ENCOUNTER — Emergency Department (HOSPITAL_BASED_OUTPATIENT_CLINIC_OR_DEPARTMENT_OTHER): Payer: Medicare Other

## 2014-11-11 DIAGNOSIS — L03114 Cellulitis of left upper limb: Secondary | ICD-10-CM | POA: Diagnosis not present

## 2014-11-11 DIAGNOSIS — Z79899 Other long term (current) drug therapy: Secondary | ICD-10-CM | POA: Diagnosis not present

## 2014-11-11 DIAGNOSIS — C3490 Malignant neoplasm of unspecified part of unspecified bronchus or lung: Secondary | ICD-10-CM | POA: Diagnosis present

## 2014-11-11 DIAGNOSIS — M1990 Unspecified osteoarthritis, unspecified site: Secondary | ICD-10-CM | POA: Diagnosis present

## 2014-11-11 DIAGNOSIS — Z9981 Dependence on supplemental oxygen: Secondary | ICD-10-CM

## 2014-11-11 DIAGNOSIS — F419 Anxiety disorder, unspecified: Secondary | ICD-10-CM | POA: Diagnosis present

## 2014-11-11 DIAGNOSIS — K5900 Constipation, unspecified: Secondary | ICD-10-CM | POA: Diagnosis present

## 2014-11-11 DIAGNOSIS — Z87891 Personal history of nicotine dependence: Secondary | ICD-10-CM | POA: Diagnosis not present

## 2014-11-11 DIAGNOSIS — T451X5A Adverse effect of antineoplastic and immunosuppressive drugs, initial encounter: Secondary | ICD-10-CM | POA: Diagnosis present

## 2014-11-11 DIAGNOSIS — IMO0001 Reserved for inherently not codable concepts without codable children: Secondary | ICD-10-CM | POA: Insufficient documentation

## 2014-11-11 DIAGNOSIS — R0602 Shortness of breath: Secondary | ICD-10-CM

## 2014-11-11 DIAGNOSIS — A419 Sepsis, unspecified organism: Secondary | ICD-10-CM | POA: Diagnosis not present

## 2014-11-11 DIAGNOSIS — Z8249 Family history of ischemic heart disease and other diseases of the circulatory system: Secondary | ICD-10-CM

## 2014-11-11 DIAGNOSIS — Z79891 Long term (current) use of opiate analgesic: Secondary | ICD-10-CM | POA: Diagnosis not present

## 2014-11-11 DIAGNOSIS — J449 Chronic obstructive pulmonary disease, unspecified: Secondary | ICD-10-CM | POA: Diagnosis present

## 2014-11-11 DIAGNOSIS — Z823 Family history of stroke: Secondary | ICD-10-CM

## 2014-11-11 DIAGNOSIS — N390 Urinary tract infection, site not specified: Secondary | ICD-10-CM

## 2014-11-11 DIAGNOSIS — K219 Gastro-esophageal reflux disease without esophagitis: Secondary | ICD-10-CM | POA: Diagnosis present

## 2014-11-11 DIAGNOSIS — D6481 Anemia due to antineoplastic chemotherapy: Secondary | ICD-10-CM | POA: Diagnosis present

## 2014-11-11 DIAGNOSIS — I739 Peripheral vascular disease, unspecified: Secondary | ICD-10-CM | POA: Diagnosis present

## 2014-11-11 DIAGNOSIS — Z7982 Long term (current) use of aspirin: Secondary | ICD-10-CM | POA: Diagnosis not present

## 2014-11-11 DIAGNOSIS — R140 Abdominal distension (gaseous): Secondary | ICD-10-CM | POA: Diagnosis present

## 2014-11-11 DIAGNOSIS — R52 Pain, unspecified: Secondary | ICD-10-CM

## 2014-11-11 DIAGNOSIS — C3400 Malignant neoplasm of unspecified main bronchus: Secondary | ICD-10-CM | POA: Diagnosis not present

## 2014-11-11 DIAGNOSIS — G2581 Restless legs syndrome: Secondary | ICD-10-CM | POA: Diagnosis present

## 2014-11-11 DIAGNOSIS — I10 Essential (primary) hypertension: Secondary | ICD-10-CM | POA: Diagnosis not present

## 2014-11-11 DIAGNOSIS — R6510 Systemic inflammatory response syndrome (SIRS) of non-infectious origin without acute organ dysfunction: Secondary | ICD-10-CM

## 2014-11-11 DIAGNOSIS — Z923 Personal history of irradiation: Secondary | ICD-10-CM

## 2014-11-11 DIAGNOSIS — C7931 Secondary malignant neoplasm of brain: Secondary | ICD-10-CM | POA: Diagnosis not present

## 2014-11-11 DIAGNOSIS — M10032 Idiopathic gout, left wrist: Secondary | ICD-10-CM | POA: Diagnosis present

## 2014-11-11 DIAGNOSIS — R300 Dysuria: Secondary | ICD-10-CM | POA: Diagnosis not present

## 2014-11-11 DIAGNOSIS — D491 Neoplasm of unspecified behavior of respiratory system: Secondary | ICD-10-CM | POA: Diagnosis present

## 2014-11-11 DIAGNOSIS — J189 Pneumonia, unspecified organism: Secondary | ICD-10-CM | POA: Diagnosis present

## 2014-11-11 LAB — TSH: TSH: 1.011 u[IU]/mL (ref 0.350–4.500)

## 2014-11-11 LAB — URINALYSIS, ROUTINE W REFLEX MICROSCOPIC
Bilirubin Urine: NEGATIVE
Glucose, UA: NEGATIVE mg/dL
Ketones, ur: NEGATIVE mg/dL
Nitrite: NEGATIVE
PH: 7.5 (ref 5.0–8.0)
Protein, ur: 30 mg/dL — AB
Specific Gravity, Urine: 1.016 (ref 1.005–1.030)
Urobilinogen, UA: 1 mg/dL (ref 0.0–1.0)

## 2014-11-11 LAB — CBC WITH DIFFERENTIAL/PLATELET
BASOS ABS: 0 10*3/uL (ref 0.0–0.1)
BASOS PCT: 0 % (ref 0–1)
BLASTS: 0 %
Band Neutrophils: 4 % (ref 0–10)
EOS ABS: 0 10*3/uL (ref 0.0–0.7)
Eosinophils Relative: 0 % (ref 0–5)
HEMATOCRIT: 27.9 % — AB (ref 39.0–52.0)
Hemoglobin: 9.2 g/dL — ABNORMAL LOW (ref 13.0–17.0)
Lymphocytes Relative: 8 % — ABNORMAL LOW (ref 12–46)
Lymphs Abs: 1.4 10*3/uL (ref 0.7–4.0)
MCH: 30.9 pg (ref 26.0–34.0)
MCHC: 33 g/dL (ref 30.0–36.0)
MCV: 93.6 fL (ref 78.0–100.0)
METAMYELOCYTES PCT: 1 %
MONOS PCT: 9 % (ref 3–12)
MYELOCYTES: 0 %
Monocytes Absolute: 1.6 10*3/uL — ABNORMAL HIGH (ref 0.1–1.0)
NRBC: 0 /100{WBCs}
Neutro Abs: 14.5 10*3/uL — ABNORMAL HIGH (ref 1.7–7.7)
Neutrophils Relative %: 78 % — ABNORMAL HIGH (ref 43–77)
OTHER: 0 %
PLATELETS: 205 10*3/uL (ref 150–400)
Promyelocytes Absolute: 0 %
RBC: 2.98 MIL/uL — AB (ref 4.22–5.81)
RDW: 16.2 % — AB (ref 11.5–15.5)
WBC: 17.5 10*3/uL — ABNORMAL HIGH (ref 4.0–10.5)

## 2014-11-11 LAB — URINE MICROSCOPIC-ADD ON

## 2014-11-11 LAB — COMPREHENSIVE METABOLIC PANEL
ALK PHOS: 128 U/L — AB (ref 38–126)
ALT: 27 U/L (ref 17–63)
ANION GAP: 8 (ref 5–15)
AST: 21 U/L (ref 15–41)
Albumin: 3.3 g/dL — ABNORMAL LOW (ref 3.5–5.0)
BUN: 8 mg/dL (ref 6–20)
CALCIUM: 8.7 mg/dL — AB (ref 8.9–10.3)
CHLORIDE: 93 mmol/L — AB (ref 101–111)
CO2: 30 mmol/L (ref 22–32)
Creatinine, Ser: 0.78 mg/dL (ref 0.61–1.24)
GFR calc Af Amer: >60 mL/min (ref >60)
GLUCOSE: 113 mg/dL — AB (ref 70–99)
Potassium: 3.8 mmol/L (ref 3.5–5.1)
Sodium: 131 mmol/L — ABNORMAL LOW (ref 135–145)
TOTAL PROTEIN: 7 g/dL (ref 6.5–8.1)
Total Bilirubin: 0.8 mg/dL (ref 0.3–1.2)

## 2014-11-11 LAB — LIPASE, BLOOD: Lipase: 31 U/L (ref 22–51)

## 2014-11-11 LAB — URIC ACID: Uric Acid, Serum: 3.3 mg/dL — ABNORMAL LOW (ref 4.4–7.6)

## 2014-11-11 LAB — I-STAT CG4 LACTIC ACID, ED: LACTIC ACID, VENOUS: 1.66 mmol/L (ref 0.5–2.0)

## 2014-11-11 LAB — PROTIME-INR
INR: 1.1 (ref 0.00–1.49)
Prothrombin Time: 14.3 seconds (ref 11.6–15.2)

## 2014-11-11 LAB — MRSA PCR SCREENING: MRSA by PCR: NEGATIVE

## 2014-11-11 MED ORDER — HEPARIN SODIUM (PORCINE) 5000 UNIT/ML IJ SOLN
5000.0000 [IU] | Freq: Three times a day (TID) | INTRAMUSCULAR | Status: DC
Start: 2014-11-11 — End: 2014-11-14
  Administered 2014-11-11 – 2014-11-14 (×8): 5000 [IU] via SUBCUTANEOUS
  Filled 2014-11-11 (×8): qty 1

## 2014-11-11 MED ORDER — ASPIRIN EC 81 MG PO TBEC
81.0000 mg | DELAYED_RELEASE_TABLET | Freq: Every day | ORAL | Status: DC
  Administered 2014-11-11 – 2014-11-13 (×3): 81 mg via ORAL
  Filled 2014-11-11 (×3): qty 1

## 2014-11-11 MED ORDER — PRAMIPEXOLE DIHYDROCHLORIDE 1 MG PO TABS
1.0000 mg | ORAL_TABLET | Freq: Every day | ORAL | Status: DC
  Administered 2014-11-11 – 2014-11-13 (×3): 1 mg via ORAL
  Filled 2014-11-11 (×4): qty 1

## 2014-11-11 MED ORDER — ALBUTEROL SULFATE (2.5 MG/3ML) 0.083% IN NEBU
2.5000 mg | INHALATION_SOLUTION | Freq: Four times a day (QID) | RESPIRATORY_TRACT | Status: DC | PRN
  Administered 2014-11-11: 2.5 mg via RESPIRATORY_TRACT
  Filled 2014-11-11 (×2): qty 3

## 2014-11-11 MED ORDER — CEFTRIAXONE SODIUM 1 G IJ SOLR
1.0000 g | Freq: Once | INTRAMUSCULAR | Status: AC
  Administered 2014-11-11: 1 g via INTRAVENOUS

## 2014-11-11 MED ORDER — ACETAMINOPHEN 500 MG PO TABS: 1000.0000 mg | ORAL_TABLET | Freq: Once | ORAL | Status: AC

## 2014-11-11 MED ORDER — SODIUM CHLORIDE 0.9 % IV SOLN: INTRAVENOUS | Status: DC

## 2014-11-11 MED ORDER — VANCOMYCIN HCL 10 G IV SOLR
1500.0000 mg | Freq: Once | INTRAVENOUS | Status: AC
  Filled 2014-11-11: qty 1500

## 2014-11-11 MED ORDER — ISOSORBIDE MONONITRATE ER 30 MG PO TB24
30.0000 mg | ORAL_TABLET | Freq: Every day | ORAL | Status: DC
  Administered 2014-11-11 – 2014-11-14 (×4): 30 mg via ORAL
  Filled 2014-11-11 (×4): qty 1

## 2014-11-11 MED ORDER — SODIUM CHLORIDE 0.9 % IV BOLUS (SEPSIS): 500.0000 mL | Freq: Once | INTRAVENOUS | Status: DC

## 2014-11-11 MED ORDER — SODIUM CHLORIDE 0.9 % IV BOLUS (SEPSIS)
1000.0000 mL | Freq: Once | INTRAVENOUS | Status: AC
  Administered 2014-11-11: 1000 mL via INTRAVENOUS

## 2014-11-11 MED ORDER — ONDANSETRON HCL 4 MG PO TABS
4.0000 mg | ORAL_TABLET | Freq: Four times a day (QID) | ORAL | Status: DC | PRN
  Administered 2014-11-14: 4 mg via ORAL
  Filled 2014-11-11: qty 1

## 2014-11-11 MED ORDER — ACETAMINOPHEN 500 MG PO TABS
1000.0000 mg | ORAL_TABLET | Freq: Once | ORAL | Status: DC
Start: 2014-11-11 — End: 2014-11-11
  Filled 2014-11-11: qty 2

## 2014-11-11 MED ORDER — ACETAMINOPHEN 325 MG PO TABS
650.0000 mg | ORAL_TABLET | Freq: Four times a day (QID) | ORAL | Status: DC | PRN
Start: 2014-11-11 — End: 2014-11-14

## 2014-11-11 MED ORDER — CLONAZEPAM 0.5 MG PO TABS
0.5000 mg | ORAL_TABLET | Freq: Two times a day (BID) | ORAL | Status: DC | PRN
  Administered 2014-11-12: 0.5 mg via ORAL
  Filled 2014-11-11: qty 1

## 2014-11-11 MED ORDER — SODIUM CHLORIDE 0.9 % IJ SOLN
3.0000 mL | Freq: Two times a day (BID) | INTRAMUSCULAR | Status: DC
  Administered 2014-11-11 – 2014-11-13 (×3): 3 mL via INTRAVENOUS

## 2014-11-11 MED ORDER — METOPROLOL TARTRATE 25 MG PO TABS: 25.0000 mg | ORAL_TABLET | Freq: Every day | ORAL | Status: DC

## 2014-11-11 MED ORDER — CEFTRIAXONE SODIUM 1 G IJ SOLR
INTRAMUSCULAR | Status: AC
  Filled 2014-11-11: qty 10

## 2014-11-11 MED ORDER — PANTOPRAZOLE SODIUM 40 MG PO TBEC
40.0000 mg | DELAYED_RELEASE_TABLET | Freq: Every day | ORAL | Status: DC
Start: 2014-11-11 — End: 2014-11-14
  Administered 2014-11-11 – 2014-11-13 (×3): 40 mg via ORAL
  Filled 2014-11-11 (×3): qty 1

## 2014-11-11 MED ORDER — MORPHINE SULFATE 2 MG/ML IJ SOLN: 1.0000 mg | INTRAMUSCULAR | Status: DC | PRN

## 2014-11-11 MED ORDER — ACETAMINOPHEN 500 MG PO TABS
1000.0000 mg | ORAL_TABLET | Freq: Once | ORAL | Status: AC
  Administered 2014-11-11: 1000 mg via ORAL

## 2014-11-11 MED ORDER — DEXTROSE 5 % IV SOLN
1.0000 g | INTRAVENOUS | Status: DC
  Administered 2014-11-12 – 2014-11-13 (×2): 1 g via INTRAVENOUS
  Filled 2014-11-11 (×2): qty 10

## 2014-11-11 MED ORDER — ACETAMINOPHEN 650 MG RE SUPP: 650.0000 mg | Freq: Four times a day (QID) | RECTAL | Status: DC | PRN

## 2014-11-11 MED ORDER — VANCOMYCIN HCL 500 MG IV SOLR
INTRAVENOUS | Status: AC
  Administered 2014-11-11: 1500 mg via INTRAVENOUS
  Filled 2014-11-11: qty 1500

## 2014-11-11 MED ORDER — SODIUM CHLORIDE 0.9 % IV SOLN
INTRAVENOUS | Status: DC
  Administered 2014-11-12: 05:00:00 via INTRAVENOUS

## 2014-11-11 MED ORDER — HYDROCODONE-ACETAMINOPHEN 7.5-325 MG PO TABS
1.0000 | ORAL_TABLET | Freq: Four times a day (QID) | ORAL | Status: DC | PRN
  Administered 2014-11-11 – 2014-11-13 (×2): 1 via ORAL
  Filled 2014-11-11 (×2): qty 1

## 2014-11-11 MED ORDER — BISOPROLOL FUMARATE 5 MG PO TABS
5.0000 mg | ORAL_TABLET | Freq: Every day | ORAL | Status: DC
  Administered 2014-11-11 – 2014-11-14 (×4): 5 mg via ORAL
  Filled 2014-11-11 (×4): qty 1

## 2014-11-11 MED ORDER — ZOLPIDEM TARTRATE 5 MG PO TABS
5.0000 mg | ORAL_TABLET | Freq: Every evening | ORAL | Status: DC | PRN
Start: 2014-11-11 — End: 2014-11-14
  Administered 2014-11-11 – 2014-11-13 (×3): 5 mg via ORAL
  Filled 2014-11-11 (×3): qty 1

## 2014-11-11 MED ORDER — CETYLPYRIDINIUM CHLORIDE 0.05 % MT LIQD
7.0000 mL | Freq: Two times a day (BID) | OROMUCOSAL | Status: DC
  Administered 2014-11-12 – 2014-11-14 (×4): 7 mL via OROMUCOSAL

## 2014-11-11 MED ORDER — ONDANSETRON HCL 4 MG/2ML IJ SOLN: 4.0000 mg | Freq: Four times a day (QID) | INTRAMUSCULAR | Status: DC | PRN

## 2014-11-11 NOTE — ED Notes (Signed)
Report to [facility] EMT-P with [facility].

## 2014-11-11 NOTE — ED Notes (Signed)
Pt wife # [phone].

## 2014-11-11 NOTE — H&P (Signed)
[facility] History and Physical  IKTAN AIKMAN EXN:[id number] DOB: 08/23/1940 DOA: 11/11/2014  Referring physician: EDP PCP: [name], MD   Chief Complaint: Feeling unwell  HPI: [name] is a 74 y.o. male with past medical history of metastatic non-small cell lung cancer came into the hospital complaining about multiple symptoms. Initially he reported having episodic shortness of breath and tachycardia comes and goes every now and then, and that is why he went to the emergency department. Then patient started also complaining about constipation, bloating and abdominal distention. Patient denies any fever, chills or chest pain. Has some cough with clear sputum production. In the ED abdominal and chest x-ray were negative for acute findings, urinalysis consistent with UTI, patient appears to be constipated and has some bloating. Had fever of 101.1, respiratory rate of 42, [facility] [place] [facility].  Review of Systems:  Constitutional: Fever and chills Eyes: negative for irritation, redness and visual disturbance Ears, nose, mouth, throat, and face: negative for earaches, epistaxis, nasal congestion and sore throat Respiratory: Episodic shortness of breath with palpitations Cardiovascular: negative for chest pain, dyspnea, lower extremity edema, orthopnea, palpitations and syncope Gastrointestinal: negative for abdominal pain, constipation, diarrhea, melena, nausea and vomiting Genitourinary:negative for dysuria, frequency and hematuria Hematologic/lymphatic: negative for bleeding, easy bruising and lymphadenopathy Musculoskeletal:negative for arthralgias, muscle weakness and stiff joints Neurological: negative for coordination problems, gait problems, headaches and weakness Endocrine: negative for diabetic symptoms including polydipsia, polyuria and weight loss Allergic/Immunologic: negative for anaphylaxis, hay fever and urticaria  Past  Medical History  Diagnosis Date  . Arthritis   . Wheezing   . Sore throat   . Carotid artery occlusion   . COPD (chronic obstructive pulmonary disease)   . Lumbar disc disease   . Restless leg syndrome   . Allergic rhinitis   . Hypertension   . Anxiety     anxiety  . Shortness of breath   . Lung mass   . GERD (gastroesophageal reflux disease)   . H/O hiatal hernia   . Pre-operative cardiovascular examination   . Nonspecific abnormal unspecified cardiovascular function study   . Peripheral vascular disease, unspecified   . Pneumonia 2014    ?   [facility] On home oxygen therapy     prn  . Constipation   . Cancer 09/14/12    invasive mod diff squamous cell ca   Past Surgical History  Procedure Laterality Date  . Carotid endarterectomy  10/15/2010    left  . Hand surgery      repair of the left long, ring, and small finger after a saw injury  . Video bronchoscopy  07/24/2012    Procedure: VIDEO BRONCHOSCOPY WITH FLUORO;  Surgeon: Kathee Delton, MD;  Location: [facility];  Service: Cardiopulmonary;  Laterality: Bilateral;  . Lobectomy Right 09/14/2012    Procedure: LOBECTOMY;  Surgeon: Ivin Poot, MD;  Location: [facility];  Service: Thoracic;  Laterality: Right;  RIGHT UPPER LOBECTOMY  . Video assisted thoracoscopy Right 09/14/2012    Procedure: VIDEO ASSISTED THORACOSCOPY;  Surgeon: Ivin Poot, MD;  Location: [facility];  Service: Thoracic;  Laterality: Right;  . Video bronchoscopy with endobronchial ultrasound N/A 08/27/2014    Procedure: VIDEO BRONCHOSCOPY WITH ENDOBRONCHIAL ULTRASOUND;  Surgeon: Ivin Poot, MD;  Location: [facility];  Service: Thoracic;  Laterality: N/A;  . Scalene node biopsy Right 08/27/2014    Procedure: BIOPSY SCALENE NODE;  Surgeon: Ivin Poot, MD;  Location: [facility]  OR;  Service: Thoracic;  Laterality: Right;  . Sterotactic radiosurgery Right 09/25/14    right parietal 30Gy/1 fx   Social History:   reports that he quit smoking about 2 months ago. His smoking use  included Cigarettes. He has a 55 pack-year smoking history. He quit smokeless tobacco use about 62 years ago. He reports that he drinks alcohol. He reports that he does not use illicit drugs.  No Known Allergies  Family History  Problem Relation Age of Onset  . Heart disease Father     MI  . Heart attack Father   . Alcohol abuse Mother   . Stroke Brother   . Heart disease Brother   . Depression Sister   . Heart attack Brother     Prior to Admission medications   Medication Sig Start Date End Date Taking? Authorizing Provider  albuterol (PROVENTIL) (2.5 MG/3ML) 0.083% nebulizer solution Take 2.5 mg by nebulization every 6 (six) hours as needed for wheezing or shortness of breath.   Yes Historical Provider, MD  aspirin EC 81 MG tablet Take 81 mg by mouth daily. Takes one tablet every evening.   Yes Historical Provider, MD  bisoprolol (ZEBETA) 5 MG tablet Take 1 tablet (5 mg total) by mouth daily. Patient taking differently: Take 5 mg by mouth daily. TAKE AS NEEDED FOR FAST HEART RATE 10/17/14  Yes Jettie Booze, MD  clonazePAM (KLONOPIN) 0.5 MG tablet Take 0.5 mg by mouth 2 (two) times daily as needed for anxiety. Takes one tablet by mouth two times daily as needed for anxiety.   Yes Historical Provider, MD  HYDROcodone-acetaminophen (NORCO) 7.5-325 MG per tablet Take 1 tablet by mouth every 6 (six) hours as needed for moderate pain. 10/23/14  Yes Curt Bears, MD  isosorbide mononitrate (IMDUR) 30 MG 24 hr tablet Take 1 tablet (30 mg total) by mouth daily. 03/07/14  Yes Jettie Booze, MD  LORazepam (ATIVAN) 0.5 MG tablet Take 1 tablet by mouth or sublingually every 8 hours as needed for nausea. May take 1 tablet by mouth at bedtime. 10/02/14  Yes Adrena E Johnson, PA-C  metoprolol tartrate (LOPRESSOR) 25 MG tablet Take 25 mg by mouth daily. 10/22/14  Yes Historical Provider, MD  omeprazole (PRILOSEC) 40 MG capsule Take 40 mg by mouth daily.   Yes Historical Provider, MD    pramipexole (MIRAPEX) 1 MG tablet Take 1 mg by mouth at bedtime.   Yes Historical Provider, MD  promethazine-codeine (PHENERGAN WITH CODEINE) 6.25-10 MG/5ML syrup Take 5 mLs by mouth every 6 (six) hours as needed for cough. 07/11/14  Yes Deneise Lever, MD  traMADol (ULTRAM) 50 MG tablet Take 1 tablet (50 mg total) by mouth every 12 (twelve) hours as needed. 10/23/14  Yes Curt Bears, MD  zolpidem (AMBIEN) 10 MG tablet Take 10 mg by mouth at bedtime as needed for sleep.   Yes Historical Provider, MD   Physical Exam: Filed Vitals:   11/11/14 1300  BP:   Pulse: 86  Temp:   Resp: 32   Constitutional: Oriented to person, place, and time. Well-developed and well-nourished. Cooperative.  Head: Normocephalic and atraumatic.  Nose: Nose normal.  Mouth/Throat: Uvula is midline, oropharynx is clear and moist and mucous membranes are normal.  Eyes: Conjunctivae and EOM are normal. Pupils are equal, round, and reactive to light.  Neck: Trachea normal and normal range of motion. Neck supple.  Cardiovascular: Normal rate, regular rhythm, S1 normal, S2 normal, normal heart sounds and intact distal pulses.  Pulmonary/Chest: Effort normal and breath sounds normal.  Abdominal: Soft. Bowel sounds are normal. There is no hepatosplenomegaly. There is no tenderness.  Musculoskeletal: Normal range of motion.  Neurological: Alert and oriented to person, place, and time. Has normal strength. No cranial nerve deficit or sensory deficit.  Skin: Skin is warm, dry and intact.  Psychiatric: Has a normal mood and affect. Speech is normal and behavior is normal.   Labs on Admission:  Basic Metabolic Panel:  Recent Labs Lab 11/06/14 0841 11/11/14 0910  NA 136 131*  K 3.7 3.8  CL  --  93*  CO2 27 30  GLUCOSE 97 113*  BUN 5.7* 8  CREATININE 0.8 0.78  CALCIUM 8.8 8.7*   Liver Function Tests:  Recent Labs Lab 11/06/14 0841 11/11/14 0910  AST 26 21  ALT 46 27  ALKPHOS 134 128*  BILITOT 0.26 0.8   PROT 6.3* 7.0  ALBUMIN 3.3* 3.3*    Recent Labs Lab 11/11/14 0910  LIPASE 31   No results for input(s): AMMONIA in the last 168 hours. CBC:  Recent Labs Lab 11/06/14 0841 11/11/14 0910  WBC 17.1* 17.5*  NEUTROABS 14.6* 14.5*  HGB 9.0* 9.2*  HCT 28.4* 27.9*  MCV 92.1 93.6  PLT 107* 205   Cardiac Enzymes: No results for input(s): CKTOTAL, CKMB, CKMBINDEX, TROPONINI in the last 168 hours.  BNP (last 3 results) No results for input(s): BNP in the last 8760 hours.  ProBNP (last 3 results)  Recent Labs  03/07/14 0918  PROBNP 18.0    CBG: No results for input(s): GLUCAP in the last 168 hours.  Radiological Exams on Admission: Dg Abd Acute W/chest  11/11/2014   CLINICAL DATA:  Abdominal pain  EXAM: DG ABDOMEN ACUTE W/ 1V CHEST  COMPARISON:  09/30/2014, 10/07/2014  FINDINGS: Cardiac shadow is stable. Stable scarring is noted within the the lungs bilaterally. Stable nodular changes are again noted as well.  Scattered large and small bowel gas is noted. No free air is seen. No definitive obstructive changes are noted. Degenerative change of the lumbar spine is seen.  IMPRESSION: Chronic changes without acute abnormality.   Electronically Signed   By: Inez Catalina M.D.   On: 11/11/2014 09:32    EKG: Independently reviewed.   Assessment/Plan Principal Problem:   Sepsis Active Problems:   Lung cancer   Essential hypertension, benign   Brain metastasis   UTI (lower urinary tract infection)    Sepsis Presented with fever of 101.1, respiratory rate of 42 and presence of infection, sepsis criteria met. Sepsis likely secondary to UTI. Patient started on broad-spectrum antibiotics vancomycin and Rocephin, will continue Rocephin only. Continue IV fluid hydration.  UTI Urinalysis consistent with UTI, placed on Rocephin. Await culture results and adjust antibiotics accordingly.  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 Patient is not on oxygen at home, appears to be doing very well, follows with [name] as outpatient  Essential hypertension Continue home medications, appears to be controlled.   Left wrist swelling and redness Suspicious for gouty arthritis versus cellulitis. Patient will be on Rocephin, follow closely if worse and will start on steroids versus Indocin. Check uric acid level.  Code Status: full code Family Communication:  plan discussed with the patient  Disposition Plan: inpatient   Time spent: 70 minutes  Matheo Rathbone A, MD [facility] Pager [phone]

## 2014-11-11 NOTE — Progress Notes (Signed)
Nov 11, 2014/Cassandra Harbold L. Rosana Hoes, RN, BSN, CCM. Case Management [facility] [phone] No discharge needs present of time of review.

## 2014-11-11 NOTE — ED Notes (Signed)
C/o constipation with no results from MOM or enema since Friday. Last NL BM was 1 wk ago. Also has red and swollen area to left wrist and ankle swelling. Also has pain in groin with burning on urination. Pt is cancer pt and being tx with chemo.

## 2014-11-11 NOTE — ED Notes (Signed)
Dr Reather Converse wants fluids to be on hold until labs come back.

## 2014-11-11 NOTE — Care Management Note (Signed)
Case Management Note  Patient Details  Name: ROMOND PIPKINS MRN: [id number] Date of Birth: Aug 07, 1940  Subjective/Objective:              sepsis      Action/Plan:    Will follow for needs   Expected Discharge Date:   Verita Schneiders) [id number]              Expected Discharge Plan:  Home/Self Care  In-House Referral:  NA  Discharge planning Services  CM Consult  Post Acute Care Choice:  NA Choice offered to:  NA  DME Arranged:    DME Agency:     HH Arranged:    [place] Agency:     Status of Service:     Medicare Important Message Given:    Date Medicare IM Given:    Medicare IM give by:    Date Additional Medicare IM Given:    Additional Medicare Important Message give by:     If discussed at [place] of Stay Meetings, dates discussed:    Additional Comments:  Leeroy Cha, RN 11/11/2014, 2:49 PM

## 2014-11-11 NOTE — ED Provider Notes (Signed)
CSN: [id number]     Arrival date & time 11/11/14  3428 History   First MD Initiated Contact with Patient 11/11/14 [phone]     Chief Complaint  Patient presents with  . Constipation     (Consider location/radiation/quality/duration/timing/severity/associated sxs/prior Treatment) HPI Comments: 74 year old male with lung cancer and metastasis followed at [facility] long by Dr. Earlie Server, COPD and smoking history, vascular disease presents with generally feeling unwell including mild dysuria, left forearm rash and decreased bowel movements. Patient's last chemotherapy, 2 weeks ago, patient was every other week. Patient received medicine for low white blood cell count at that time. No fevers at home. No current antibiotics or sick contacts. Patient has small amount gas however feels overall bloated and minimal bowel movements for 3 days, last normal bowel movement one week ago. No bowel obstruction or abdominal surgery. 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 Symptoms gradually worsening over the weekend.  Patient is a 74 y.o. male presenting with constipation. The history is provided by the patient.  Constipation Associated symptoms: abdominal pain (general discomfort worse lower) and dysuria   Associated symptoms: no back pain, no fever and no vomiting     Past Medical History  Diagnosis Date  . Arthritis   . Wheezing   . Sore throat   . Carotid artery occlusion   . COPD (chronic obstructive pulmonary disease)   . Lumbar disc disease   . Restless leg syndrome   . Allergic rhinitis   . Hypertension   . Anxiety     anxiety  . Shortness of breath   . Lung mass   . GERD (gastroesophageal reflux disease)   . H/O hiatal hernia   . Pre-operative cardiovascular examination   . Nonspecific abnormal unspecified cardiovascular function study   . Peripheral vascular disease, unspecified   . Pneumonia 2014    ?   Marland Kitchen On home oxygen therapy     prn  . Constipation   . Cancer  09/14/12    invasive mod diff squamous cell ca   Past Surgical History  Procedure Laterality Date  . Carotid endarterectomy  10/15/2010    left  . Hand surgery      repair of the left long, ring, and small finger after a saw injury  . Video bronchoscopy  07/24/2012    Procedure: VIDEO BRONCHOSCOPY WITH FLUORO;  Surgeon: Kathee Delton, MD;  Location: [facility];  Service: Cardiopulmonary;  Laterality: Bilateral;  . Lobectomy Right 09/14/2012    Procedure: LOBECTOMY;  Surgeon: Ivin Poot, MD;  Location: [place];  Service: Thoracic;  Laterality: Right;  RIGHT UPPER LOBECTOMY  . Video assisted thoracoscopy Right 09/14/2012    Procedure: VIDEO ASSISTED THORACOSCOPY;  Surgeon: Ivin Poot, MD;  Location: [place];  Service: Thoracic;  Laterality: Right;  . Video bronchoscopy with endobronchial ultrasound N/A 08/27/2014    Procedure: VIDEO BRONCHOSCOPY WITH ENDOBRONCHIAL ULTRASOUND;  Surgeon: Ivin Poot, MD;  Location: [facility] OR;  Service: Thoracic;  Laterality: N/A;  . Scalene node biopsy Right 08/27/2014    Procedure: BIOPSY SCALENE NODE;  Surgeon: Ivin Poot, MD;  Location: [facility] OR;  Service: Thoracic;  Laterality: Right;  . Sterotactic radiosurgery Right 09/25/14    right parietal 30Gy/1 fx   Family History  Problem Relation Age of Onset  . Heart disease Father     MI  . Heart attack Father   . Alcohol abuse Mother   . Stroke Brother   . Heart  disease Brother   . Depression Sister   . Heart attack Brother    History  Substance Use Topics  . Smoking status: Former Smoker -- 1.00 packs/day for 55 years    Types: Cigarettes    Quit date: 08/16/2014  . Smokeless tobacco: Former Systems developer    Quit date: 04/19/1952  . Alcohol Use: Yes     Comment: occianional- not since I ve sick.    Review of Systems  Constitutional: Positive for appetite change and fatigue. Negative for fever and chills.  HENT: Negative for congestion.   Eyes: Negative for visual disturbance.  Respiratory: Negative  for shortness of breath.   Cardiovascular: Negative for chest pain.  Gastrointestinal: Positive for abdominal pain (general discomfort worse lower) and constipation. Negative for vomiting.  Genitourinary: Positive for dysuria. Negative for flank pain.  Musculoskeletal: Negative for back pain, neck pain and neck stiffness.  Skin: Positive for rash.  Neurological: Positive for weakness (general), light-headedness and headaches (similar to previous gradual onset).      Allergies  Review of patient's allergies indicates no known allergies.  Home Medications   Prior to Admission medications   Medication Sig Start Date End Date Taking? Authorizing Provider  albuterol (PROVENTIL) (2.5 MG/3ML) 0.083% nebulizer solution Take 2.5 mg by nebulization every 6 (six) hours as needed for wheezing or shortness of breath.   Yes Historical Provider, MD  aspirin EC 81 MG tablet Take 81 mg by mouth daily.    Yes Historical Provider, MD  bisoprolol (ZEBETA) 5 MG tablet Take 1 tablet (5 mg total) by mouth daily. Patient taking differently: Take 5 mg by mouth daily. TAKE AS NEEDED FOR FAST HEART RATE 10/17/14  Yes Jettie Booze, MD  clonazePAM (KLONOPIN) 0.5 MG tablet Take 0.5 mg by mouth 2 (two) times daily as needed for anxiety. Takes one tablet by mouth two times daily as needed for anxiety.   Yes Historical Provider, MD  HYDROcodone-acetaminophen (NORCO) 7.5-325 MG per tablet Take 1 tablet by mouth every 6 (six) hours as needed for moderate pain. 10/23/14  Yes Curt Bears, MD  LORazepam (ATIVAN) 0.5 MG tablet Take 1 tablet by mouth or sublingually every 8 hours as needed for nausea. May take 1 tablet by mouth at bedtime. 10/02/14  Yes Adrena E Johnson, PA-C  metoprolol tartrate (LOPRESSOR) 25 MG tablet Take 25 mg by mouth daily. 10/22/14  Yes Historical Provider, MD  omeprazole (PRILOSEC) 40 MG capsule Take 40 mg by mouth daily.   Yes Historical Provider, MD  pramipexole (MIRAPEX) 1 MG tablet Take 1 mg  by mouth at bedtime.   Yes Historical Provider, MD  traMADol (ULTRAM) 50 MG tablet Take 1 tablet (50 mg total) by mouth every 12 (twelve) hours as needed. 10/23/14  Yes Curt Bears, MD  zolpidem (AMBIEN) 10 MG tablet Take 10 mg by mouth at bedtime as needed for sleep.   Yes Historical Provider, MD   BP 129/62 mmHg  Pulse 95  Temp(Src) 98.4 F (36.9 C) (Oral)  Resp 22  Ht '5\' 9"'$  (1.753 m)  Wt 175 lb 14.8 oz (79.8 kg)  BMI 25.97 kg/m2  SpO2 99% Physical Exam  Constitutional: He is oriented to person, place, and time. He appears well-developed and well-nourished.  HENT:  Head: Normocephalic and atraumatic.  Dry mm, neck supple  Eyes: Right eye exhibits no discharge. Left eye exhibits no discharge.  Neck: Normal range of motion. Neck supple. No tracheal deviation present.  Cardiovascular: Regular rhythm.  Tachycardia present.  Pulmonary/Chest: Effort normal. He has rales (sparse bilateral).  Abdominal: Soft. He exhibits no distension (decr BS). There is tenderness (mild suprapubic). There is no guarding.  Musculoskeletal: He exhibits edema (mild ankles bilateral).  Neurological: He is alert and oriented to person, place, and time.  Skin: Skin is warm. No rash noted.  Patient has erythema and warmth to the distal dorsal forearm, no effusion to the wrist joint, mild tender to palpation, no crepitus.  Psychiatric: He has a normal mood and affect.  Nursing note and vitals reviewed.   ED Course  Procedures (including critical care time) Labs Review Labs Reviewed  CBC WITH DIFFERENTIAL/PLATELET - Abnormal; Notable for the following:    WBC 17.5 (*)    RBC 2.98 (*)    Hemoglobin 9.2 (*)    HCT 27.9 (*)    RDW 16.2 (*)    Neutrophils Relative % 78 (*)    Lymphocytes Relative 8 (*)    Neutro Abs 14.5 (*)    Monocytes Absolute 1.6 (*)    All other components within normal limits  COMPREHENSIVE METABOLIC PANEL - Abnormal; Notable for the following:    Sodium 131 (*)    Chloride 93  (*)    Glucose, Bld 113 (*)    Calcium 8.7 (*)    Albumin 3.3 (*)    Alkaline Phosphatase 128 (*)    All other components within normal limits  URINALYSIS, ROUTINE W REFLEX MICROSCOPIC - Abnormal; Notable for the following:    Color, Urine AMBER (*)    Hgb urine dipstick TRACE (*)    Protein, ur 30 (*)    Leukocytes, UA MODERATE (*)    All other components within normal limits  URINE MICROSCOPIC-ADD ON - Abnormal; Notable for the following:    Squamous Epithelial / LPF FEW (*)    Bacteria, UA MANY (*)    All other components within normal limits  HEMOGLOBIN A1C - Abnormal; Notable for the following:    Hgb A1c MFr Bld 6.6 (*)    All other components within normal limits  URIC ACID - Abnormal; Notable for the following:    Uric Acid, Serum 3.3 (*)    All other components within normal limits  BASIC METABOLIC PANEL - Abnormal; Notable for the following:    Sodium 131 (*)    Potassium 3.4 (*)    Chloride 100 (*)    Glucose, Bld 110 (*)    Calcium 8.0 (*)    All other components within normal limits  CBC - Abnormal; Notable for the following:    WBC 16.3 (*)    RBC 2.57 (*)    Hemoglobin 7.7 (*)    HCT 24.0 (*)    RDW 16.7 (*)    All other components within normal limits  CULTURE, BLOOD (ROUTINE X 2)  CULTURE, BLOOD (ROUTINE X 2)  URINE CULTURE  MRSA PCR SCREENING  LIPASE, BLOOD  PROTIME-INR  TSH  BASIC METABOLIC PANEL  CBC  I-STAT CG4 LACTIC ACID, ED    Imaging Review Dg Wrist 2 Views Left  11/11/2014   CLINICAL DATA:  Chronic left wrist pain and swelling posteriorly.  EXAM: LEFT WRIST - 2 VIEW  COMPARISON:  02/14/2010  FINDINGS: Osteoarthritic changes present of the carpal bones involving the scaphoid, lunate, and capitate articulation. These carpal bones demonstrate joint space loss, sclerosis and bony spurring. Distal radius and ulna appear intact. Mild dorsal soft tissue swelling on the lateral view. No acute fracture evident.  IMPRESSION: Osteoarthritis of  the  carpal bones, similar appearance compared to 2011.  No acute osseous finding.   Electronically Signed   By: Jerilynn Mages.  Shick M.D.   On: 11/11/2014 14:36   Dg Abd Acute W/chest  11/11/2014   CLINICAL DATA:  Abdominal pain  EXAM: DG ABDOMEN ACUTE W/ 1V CHEST  COMPARISON:  09/30/2014, 10/07/2014  FINDINGS: Cardiac shadow is stable. Stable scarring is noted within the the lungs bilaterally. Stable nodular changes are again noted as well.  Scattered large and small bowel gas is noted. No free air is seen. No definitive obstructive changes are noted. Degenerative change of the lumbar spine is seen.  IMPRESSION: Chronic changes without acute abnormality.   Electronically Signed   By: Inez Catalina M.D.   On: 11/11/2014 09:32     EKG Interpretation None      MDM   Final diagnoses:  Left arm cellulitis  SIRS (systemic inflammatory response syndrome)  UTI (lower urinary tract infection)  Sepsis, due to unspecified organism   Cancer patient presents with multiple different concerns generally feels unwell. Patient clinically has cellulitis left arm and tachycardic with borderline temperature, rectal temp pending. Abdominal discomfort likely due to narcotics and decreased bowel movements, x-ray pending. No peritonitis on exam. With Sirs criteria and chemotherapy 10 days prior likely plan for transfer to [facility] long.  The patients results and plan were reviewed and discussed.   Any x-rays performed were personally reviewed by myself.   Differential diagnosis were considered with the presenting HPI.  Medications  pantoprazole (PROTONIX) EC tablet 40 mg (40 mg Oral Given 11/12/14 0916)  aspirin EC tablet 81 mg (81 mg Oral Given 11/12/14 0915)  pramipexole (MIRAPEX) tablet 1 mg (1 mg Oral Given 11/12/14 2021)  isosorbide mononitrate (IMDUR) 24 hr tablet 30 mg (30 mg Oral Given 11/12/14 0915)  bisoprolol (ZEBETA) tablet 5 mg (5 mg Oral Given 11/12/14 0915)  HYDROcodone-acetaminophen (NORCO) 7.5-325 MG per tablet 1  tablet (1 tablet Oral Given 11/11/14 1737)  zolpidem (AMBIEN) tablet 5 mg (5 mg Oral Given 11/12/14 2021)  clonazePAM (KLONOPIN) tablet 0.5 mg (0.5 mg Oral Given 11/12/14 2021)  heparin injection 5,000 Units (5,000 Units Subcutaneous Given 11/12/14 2020)  sodium chloride 0.9 % injection 3 mL (3 mLs Intravenous Not Given 11/12/14 2200)  0.9 %  sodium chloride infusion ( Intravenous Rate/Dose Change 11/12/14 0816)  acetaminophen (TYLENOL) tablet 650 mg (not administered)    Or  acetaminophen (TYLENOL) suppository 650 mg (not administered)  morphine 2 MG/ML injection 1 mg (not administered)  ondansetron (ZOFRAN) tablet 4 mg (not administered)    Or  ondansetron (ZOFRAN) injection 4 mg (not administered)  cefTRIAXone (ROCEPHIN) 1 g in dextrose 5 % 50 mL IVPB (1 g Intravenous Given 11/12/14 0915)  antiseptic oral rinse (CPC / CETYLPYRIDINIUM CHLORIDE 0.05%) solution 7 mL (7 mLs Mouth Rinse Not Given 11/12/14 2200)  guaiFENesin (MUCINEX) 12 hr tablet 1,200 mg (1,200 mg Oral Given 11/12/14 2020)  guaiFENesin-dextromethorphan (ROBITUSSIN DM) 100-10 MG/5ML syrup 5 mL (not administered)  ipratropium-albuterol (DUONEB) 0.5-2.5 (3) MG/3ML nebulizer solution 3 mL (3 mLs Nebulization Given 11/12/14 2331)  sodium chloride 0.9 % bolus 1,000 mL (1,000 mLs Intravenous New Bag/Given 11/11/14 1013)  acetaminophen (TYLENOL) tablet 1,000 mg (0 mg Oral Duplicate 02/09/18 1478)  cefTRIAXone (ROCEPHIN) 1 g in dextrose 5 % 50 mL IVPB (0 g Intravenous Stopped 11/11/14 1100)  sodium chloride 0.9 % bolus 1,000 mL (1,000 mLs Intravenous Given 11/11/14 1310)  cefTRIAXone (ROCEPHIN) 1 G injection (  Duplicate 08/20/54  1013)  acetaminophen (TYLENOL) tablet 1,000 mg (1,000 mg Oral Given 11/11/14 1011)  vancomycin (VANCOCIN) 1,500 mg in sodium chloride 0.9 % 500 mL IVPB (0 mg Intravenous Duplicate 09/16/42 5146)  vancomycin (VANCOCIN) 500 MG powder (1,500 mg Intravenous Given 11/11/14 1104)  albuterol (PROVENTIL) (2.5 MG/3ML) 0.083% nebulizer solution (   Duplicate 0/4/79 9872)  potassium chloride SA (K-DUR,KLOR-CON) CR tablet 40 mEq (40 mEq Oral Given 11/12/14 1509)  sorbitol, milk of mag, mineral oil, glycerin (SMOG) enema (960 mLs Rectal Given 11/12/14 1036)    Filed Vitals:   11/12/14 1511 11/12/14 1614 11/12/14 2103 11/12/14 2331  BP:   129/62   Pulse:   95   Temp: 98.6 F (37 C)  98.4 F (36.9 C)   TempSrc: Oral  Oral   Resp:   22   Height:      Weight:      SpO2:  93% 93% 99%    Final diagnoses:  Left arm cellulitis  SIRS (systemic inflammatory response syndrome)  UTI (lower urinary tract infection)  Sepsis, due to unspecified organism    Admission/ observation were discussed with the admitting physician, patient and/or family and they are comfortable with the plan.     Elnora Morrison, MD 11/13/14 [phone]

## 2014-11-11 NOTE — ED Notes (Addendum)
Report given to Freda Munro RN at [facility]. IV left in place for transport.

## 2014-11-11 NOTE — ED Notes (Signed)
Care turned over to [facility] transport to transfer to [place].

## 2014-11-11 NOTE — Progress Notes (Signed)
Patient is a 74 year old gentleman history of lung cancer with metastases on chronic chemotherapy last dose approximately 10 days ago. He physician, being followed by Dr. Earlie Server presenting to metastases and a [place] with several days of feeling unwell with mild dysuria, left upper extremity cellulitis with a urinalysis consistent with a UTI. Per ED physician patient septic meeting sepsis criteria with a fever of 101, tachycardia of 105 white count of 17,000. Patient has been pancultured lactic acid is pending. Patient has been given IV Rocephin. Patient accepted to step down unit, at at [facility].

## 2014-11-11 NOTE — ED Notes (Signed)
[facility] at bedside for transport. 

## 2014-11-12 ENCOUNTER — Telehealth: Payer: Self-pay | Admitting: *Deleted

## 2014-11-12 LAB — BASIC METABOLIC PANEL
Anion gap: 5 (ref 5–15)
BUN: 7 mg/dL (ref 6–20)
CALCIUM: 8 mg/dL — AB (ref 8.9–10.3)
CO2: 26 mmol/L (ref 22–32)
CREATININE: 0.8 mg/dL (ref 0.61–1.24)
Chloride: 100 mmol/L — ABNORMAL LOW (ref 101–111)
Glucose, Bld: 110 mg/dL — ABNORMAL HIGH (ref 70–99)
Potassium: 3.4 mmol/L — ABNORMAL LOW (ref 3.5–5.1)
SODIUM: 131 mmol/L — AB (ref 135–145)

## 2014-11-12 LAB — URINE CULTURE
Colony Count: NO GROWTH
Culture: NO GROWTH

## 2014-11-12 LAB — CBC
HEMATOCRIT: 24 % — AB (ref 39.0–52.0)
HEMOGLOBIN: 7.7 g/dL — AB (ref 13.0–17.0)
MCH: 30 pg (ref 26.0–34.0)
MCHC: 32.1 g/dL (ref 30.0–36.0)
MCV: 93.4 fL (ref 78.0–100.0)
PLATELETS: 204 10*3/uL (ref 150–400)
RBC: 2.57 MIL/uL — ABNORMAL LOW (ref 4.22–5.81)
RDW: 16.7 % — AB (ref 11.5–15.5)
WBC: 16.3 10*3/uL — AB (ref 4.0–10.5)

## 2014-11-12 LAB — HEMOGLOBIN A1C
Hgb A1c MFr Bld: 6.6 % — ABNORMAL HIGH (ref 4.8–5.6)
Mean Plasma Glucose: 143 mg/dL

## 2014-11-12 MED ORDER — SORBITOL 70 % SOLN
960.0000 mL | TOPICAL_OIL | Freq: Once | ORAL | Status: AC
  Administered 2014-11-12: 960 mL via RECTAL
  Filled 2014-11-12: qty 240

## 2014-11-12 MED ORDER — POTASSIUM CHLORIDE CRYS ER 20 MEQ PO TBCR
40.0000 meq | EXTENDED_RELEASE_TABLET | Freq: Four times a day (QID) | ORAL | Status: AC
  Administered 2014-11-12 (×2): 40 meq via ORAL
  Filled 2014-11-12 (×2): qty 2

## 2014-11-12 MED ORDER — GUAIFENESIN-DM 100-10 MG/5ML PO SYRP
5.0000 mL | ORAL_SOLUTION | ORAL | Status: DC | PRN
  Administered 2014-11-14: 5 mL via ORAL
  Filled 2014-11-12 (×2): qty 10

## 2014-11-12 MED ORDER — ALBUTEROL SULFATE (2.5 MG/3ML) 0.083% IN NEBU
2.5000 mg | INHALATION_SOLUTION | RESPIRATORY_TRACT | Status: DC
  Administered 2014-11-12: 2.5 mg via RESPIRATORY_TRACT

## 2014-11-12 MED ORDER — IPRATROPIUM-ALBUTEROL 0.5-2.5 (3) MG/3ML IN SOLN
3.0000 mL | RESPIRATORY_TRACT | Status: DC
  Administered 2014-11-12 (×5): 3 mL via RESPIRATORY_TRACT
  Filled 2014-11-12 (×5): qty 3

## 2014-11-12 MED ORDER — GUAIFENESIN ER 600 MG PO TB12
1200.0000 mg | ORAL_TABLET | Freq: Two times a day (BID) | ORAL | Status: DC
  Administered 2014-11-12 – 2014-11-14 (×5): 1200 mg via ORAL
  Filled 2014-11-12 (×5): qty 2

## 2014-11-12 MED ORDER — ALBUTEROL SULFATE (2.5 MG/3ML) 0.083% IN NEBU
INHALATION_SOLUTION | RESPIRATORY_TRACT | Status: AC
  Filled 2014-11-12: qty 3

## 2014-11-12 NOTE — Telephone Encounter (Signed)
PT. IS FOR LAB AT 9:45AM, SEE LISA THOMAS,NP, AND INFUSION TOMORROW. PT. AND WIFE WOULD LIKE TO KNOW ABOUT THESE APPOINTMENTS PARTICULARLY THE CHEMOTHERAPY INFUSION.

## 2014-11-12 NOTE — Progress Notes (Signed)
Patient wife called out for RN to come to room because patient was very SOB and needed oxygen.  Checked oxygen saturation which was 99% on RA but tachy at 36 resp/min.  Oxygen 2 L/M [place] applied and after a few minutes his labored breathing improved. Patient states that he becomes SOB sometimes after his coughing episodes.  Will continue to monitor.

## 2014-11-12 NOTE — Progress Notes (Signed)
Patient is stable at transfer. Report given to nurse.

## 2014-11-12 NOTE — Progress Notes (Signed)
[facility] PROGRESS NOTE   [name] JJO:[id number] DOB: December 20, 1940 DOA: 11/11/2014 PCP: [name], MD  HPI/Subjective: Denies shortness of breath, has cough with minimal sputum production. Low-grade fever of 99.3 this morning. Continue current antibiotics.  Assessment/Plan: Principal Problem:   Sepsis Active Problems:   Lung cancer   Essential hypertension, benign   Brain metastasis   UTI (lower urinary tract infection)   Sepsis Presented with fever of 101.1, respiratory rate of 42 and presence of infection, sepsis criteria met. Sepsis likely secondary to UTI. Patient started on broad-spectrum antibiotics vancomycin and Rocephin, will continue Rocephin only. Continue fluids, hemoglobin dropped from 9.2 down to 7.7, suspect hemodilution.  UTI Urinalysis consistent with UTI, placed on Rocephin. Adjust antibiotics according to the culture results.  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 Patient is not on oxygen at home, appears to be doing very well, follows with [name] as outpatient. Chest x-ray no acute findings, symptomatic management with mucolytics and oxygen.  Essential hypertension Continue home medications, appears to be controlled.   Left wrist swelling and redness Suspicious for gouty arthritis versus cellulitis. No history of gout and uric acid is 3.3 Patient will be on Rocephin, follow closely if worse and will start on steroids versus Indocin.    Code Status: Full Code Family Communication: Plan discussed with the patient. Disposition Plan: Remains inpatient Diet: Diet Heart Room service appropriate?: Yes; Fluid consistency:: Thin  Consultants:  None  Procedures:  None  Antibiotics:  None   Objective: Filed Vitals:   11/12/14 0600  BP: 142/58  Pulse: 79  Temp:   Resp: 33    Intake/Output Summary (Last 24 hours) at 11/12/14 0811 Last  data filed at 11/12/14 0600  Gross per 24 hour  Intake   2353 ml  Output    775 ml  Net   1578 ml   Filed Weights   11/11/14 0851 11/11/14 1146  Weight: 77.111 kg (170 lb) 79.8 kg (175 lb 14.8 oz)    Exam: General: Alert and awake, oriented x3, not in any acute distress. HEENT: anicteric sclera, pupils reactive to light and accommodation, EOMI CVS: S1-S2 clear, no murmur rubs or gallops Chest: clear to auscultation bilaterally, no wheezing, rales or rhonchi Abdomen: soft nontender, nondistended, normal bowel sounds, no organomegaly Extremities: no cyanosis, clubbing or edema noted bilaterally Neuro: Cranial nerves II-XII intact, no focal neurological deficits  Data Reviewed: Basic Metabolic Panel:  Recent Labs Lab 11/06/14 0841 11/11/14 0910 11/12/14 0330  NA 136 131* 131*  K 3.7 3.8 3.4*  CL  --  93* 100*  CO2 _0 GLUCOSE 97 113* 110*  BUN 5.7* 8 7  CREATININE 0.8 0.78 0.80  CALCIUM 8.8 8.7* 8.0*   Liver Function Tests:  Recent Labs Lab 11/06/14 0841 11/11/14 0910  AST 26 21  ALT 46 27  ALKPHOS 134 128*  BILITOT 0.26 0.8  PROT 6.3* 7.0  ALBUMIN 3.3* 3.3*    Recent Labs Lab 11/11/14 0910  LIPASE 31   No results for input(s): AMMONIA in the last 168 hours. CBC:  Recent Labs Lab 11/06/14 0841 11/11/14 0910 11/12/14 0330  WBC 17.1* 17.5* 16.3*  NEUTROABS 14.6* 14.5*  --   HGB 9.0* 9.2* 7.7*  HCT 28.4* 27.9* 24.0*  MCV 92.1 93.6 93.4  PLT 107* 205 204   Cardiac Enzymes: No results for input(s): CKTOTAL, CKMB, CKMBINDEX, TROPONINI in the last 168  hours. BNP (last 3 results) No results for input(s): BNP in the last 8760 hours.  ProBNP (last 3 results)  Recent Labs  03/07/14 0918  PROBNP 18.0    CBG: No results for input(s): GLUCAP in the last 168 hours.  Micro Recent Results (from the past 240 hour(s))  MRSA PCR Screening     Status: None   Collection Time: 11/11/14  4:49 PM  Result Value Ref Range Status   MRSA by PCR  NEGATIVE NEGATIVE Final    Comment:        The GeneXpert MRSA Assay (FDA approved for NASAL specimens only), is one component of a comprehensive MRSA colonization surveillance program. It is not intended to diagnose MRSA infection nor to guide or monitor treatment for MRSA infections.      Studies: Dg Wrist 2 Views Left  11/11/2014   CLINICAL DATA:  Chronic left wrist pain and swelling posteriorly.  EXAM: LEFT WRIST - 2 VIEW  COMPARISON:  02/14/2010  FINDINGS: Osteoarthritic changes present of the carpal bones involving the scaphoid, lunate, and capitate articulation. These carpal bones demonstrate joint space loss, sclerosis and bony spurring. Distal radius and ulna appear intact. Mild dorsal soft tissue swelling on the lateral view. No acute fracture evident.  IMPRESSION: Osteoarthritis of the carpal bones, similar appearance compared to 2011.  No acute osseous finding.   Electronically Signed   By: Jerilynn Mages.  Shick M.D.   On: 11/11/2014 14:36   Dg Abd Acute W/chest  11/11/2014   CLINICAL DATA:  Abdominal pain  EXAM: DG ABDOMEN ACUTE W/ 1V CHEST  COMPARISON:  09/30/2014, 10/07/2014  FINDINGS: Cardiac shadow is stable. Stable scarring is noted within the the lungs bilaterally. Stable nodular changes are again noted as well.  Scattered large and small bowel gas is noted. No free air is seen. No definitive obstructive changes are noted. Degenerative change of the lumbar spine is seen.  IMPRESSION: Chronic changes without acute abnormality.   Electronically Signed   By: Inez Catalina M.D.   On: 11/11/2014 09:32    Scheduled Meds: . albuterol  2.5 mg Nebulization Q4H  . antiseptic oral rinse  7 mL Mouth Rinse BID  . aspirin EC  81 mg Oral Daily  . bisoprolol  5 mg Oral Daily  . cefTRIAXone (ROCEPHIN)  IV  1 g Intravenous Q24H  . heparin  5,000 Units Subcutaneous 3 times per day  . isosorbide mononitrate  30 mg Oral Daily  . pantoprazole  40 mg Oral Daily  . pramipexole  1 mg Oral QHS  . sodium  chloride  3 mL Intravenous Q12H   Continuous Infusions: . sodium chloride 100 mL/hr at 11/12/14 0439       Time spent: 35 minutes    [facility]  [facility] Pager [phone] If 7PM-7AM, please contact night-coverage at [email or web address], password [facility] 11/12/2014, 8:11 AM  LOS: 1 day

## 2014-11-13 ENCOUNTER — Telehealth: Payer: Self-pay | Admitting: Internal Medicine

## 2014-11-13 ENCOUNTER — Inpatient Hospital Stay (HOSPITAL_COMMUNITY): Payer: Medicare Other

## 2014-11-13 ENCOUNTER — Ambulatory Visit: Payer: Medicare Other

## 2014-11-13 ENCOUNTER — Ambulatory Visit: Payer: Medicare Other | Admitting: Nurse Practitioner

## 2014-11-13 ENCOUNTER — Other Ambulatory Visit: Payer: Medicare Other

## 2014-11-13 LAB — EXPECTORATED SPUTUM ASSESSMENT W REFEX TO RESP CULTURE

## 2014-11-13 LAB — CBC
HCT: 23.5 % — ABNORMAL LOW (ref 39.0–52.0)
Hemoglobin: 7.6 g/dL — ABNORMAL LOW (ref 13.0–17.0)
MCH: 30.2 pg (ref 26.0–34.0)
MCHC: 32.3 g/dL (ref 30.0–36.0)
MCV: 93.3 fL (ref 78.0–100.0)
Platelets: 272 10*3/uL (ref 150–400)
RBC: 2.52 MIL/uL — ABNORMAL LOW (ref 4.22–5.81)
RDW: 16.7 % — ABNORMAL HIGH (ref 11.5–15.5)
WBC: 16.9 10*3/uL — ABNORMAL HIGH (ref 4.0–10.5)

## 2014-11-13 LAB — BASIC METABOLIC PANEL
ANION GAP: 7 (ref 5–15)
BUN: 5 mg/dL — AB (ref 6–20)
CHLORIDE: 98 mmol/L — AB (ref 101–111)
CO2: 27 mmol/L (ref 22–32)
Calcium: 8.2 mg/dL — ABNORMAL LOW (ref 8.9–10.3)
Creatinine, Ser: 0.73 mg/dL (ref 0.61–1.24)
GFR calc Af Amer: >60 mL/min (ref >60)
GFR calc non Af Amer: >60 mL/min (ref >60)
Glucose, Bld: 108 mg/dL — ABNORMAL HIGH (ref 70–99)
Potassium: 3.7 mmol/L (ref 3.5–5.1)
Sodium: 132 mmol/L — ABNORMAL LOW (ref 135–145)

## 2014-11-13 LAB — EXPECTORATED SPUTUM ASSESSMENT W GRAM STAIN, RFLX TO RESP C

## 2014-11-13 MED ORDER — VANCOMYCIN HCL IN DEXTROSE 1-5 GM/200ML-% IV SOLN
1000.0000 mg | Freq: Two times a day (BID) | INTRAVENOUS | Status: DC
  Administered 2014-11-14: 1000 mg via INTRAVENOUS
  Filled 2014-11-13 (×2): qty 200

## 2014-11-13 MED ORDER — SODIUM CHLORIDE 0.9 % IV SOLN
INTRAVENOUS | Status: AC
  Administered 2014-11-13: 17:00:00 via INTRAVENOUS

## 2014-11-13 MED ORDER — COLCHICINE 0.6 MG PO TABS
0.6000 mg | ORAL_TABLET | Freq: Two times a day (BID) | ORAL | Status: DC
  Administered 2014-11-13 – 2014-11-14 (×2): 0.6 mg via ORAL
  Filled 2014-11-13 (×2): qty 1

## 2014-11-13 MED ORDER — DEXTROSE 5 % IV SOLN
2.0000 g | Freq: Three times a day (TID) | INTRAVENOUS | Status: DC
  Administered 2014-11-13 – 2014-11-14 (×3): 2 g via INTRAVENOUS
  Filled 2014-11-13 (×4): qty 2

## 2014-11-13 MED ORDER — IPRATROPIUM-ALBUTEROL 0.5-2.5 (3) MG/3ML IN SOLN
3.0000 mL | Freq: Four times a day (QID) | RESPIRATORY_TRACT | Status: DC
Start: 2014-11-13 — End: 2014-11-14
  Administered 2014-11-13 – 2014-11-14 (×5): 3 mL via RESPIRATORY_TRACT
  Filled 2014-11-13 (×7): qty 3

## 2014-11-13 MED ORDER — ALBUTEROL SULFATE (2.5 MG/3ML) 0.083% IN NEBU
2.5000 mg | INHALATION_SOLUTION | RESPIRATORY_TRACT | Status: DC | PRN
  Administered 2014-11-13 – 2014-11-14 (×2): 2.5 mg via RESPIRATORY_TRACT
  Filled 2014-11-13 (×2): qty 3

## 2014-11-13 MED ORDER — VANCOMYCIN HCL IN DEXTROSE 1-5 GM/200ML-% IV SOLN
1000.0000 mg | Freq: Two times a day (BID) | INTRAVENOUS | Status: DC
  Administered 2014-11-13: 1000 mg via INTRAVENOUS
  Filled 2014-11-13 (×3): qty 200

## 2014-11-13 NOTE — Progress Notes (Signed)
ANTIBIOTIC CONSULT NOTE - INITIAL  Pharmacy Consult for vancomycin/ceftazadime Indication: UTI/HCAP  No Known Allergies  Patient Measurements: Height: '5\' 9"'  (175.3 cm) Weight: 175 lb 14.8 oz (79.8 kg) IBW/kg (Calculated) : 70.7 Adjusted Body Weight:   Vital Signs: Temp: 98.4 F (36.9 C) (05/04 0517) Temp Source: Oral (05/04 0517) BP: 135/64 mmHg (05/04 0517) Pulse Rate: 94 (05/04 0517) Intake/Output from previous day: 05/03 0701 - 05/04 0700 In: 553 [P.O.:360; I.V.:143; IV Piggyback:50] Out: 250 [Urine:250] Intake/Output from this shift:    Labs:  Recent Labs  11/11/14 0910 11/12/14 0330 11/13/14 0501  WBC 17.5* 16.3* 16.9*  HGB 9.2* 7.7* 7.6*  PLT 205 204 272  CREATININE 0.78 0.80 0.73   Estimated Creatinine Clearance: 84.7 mL/min (by C-G formula based on Cr of 0.73). No results for input(s): VANCOTROUGH, VANCOPEAK, VANCORANDOM, GENTTROUGH, GENTPEAK, GENTRANDOM, TOBRATROUGH, TOBRAPEAK, TOBRARND, AMIKACINPEAK, AMIKACINTROU, AMIKACIN in the last 72 hours.   Microbiology: Recent Results (from the past 720 hour(s))  Urine culture     Status: None   Collection Time: 11/11/14  9:00 AM  Result Value Ref Range Status   Specimen Description URINE, CLEAN CATCH  Final   Special Requests NONE  Final   Colony Count NO GROWTH Performed at [facility]   Final   Culture NO GROWTH Performed at [facility]   Final   Report Status 11/12/2014 FINAL  Final  Blood culture (routine x 2)     Status: None (Preliminary result)   Collection Time: 11/11/14 10:20 AM  Result Value Ref Range Status   Specimen Description BLOOD RT HAND  Final   Special Requests BOTTLES DRAWN AEROBIC AND ANAEROBIC 5CC  Final   Culture   Final           BLOOD CULTURE RECEIVED NO GROWTH TO DATE CULTURE WILL BE HELD FOR 5 DAYS BEFORE ISSUING A FINAL NEGATIVE REPORT Performed at [facility]    Report Status PENDING  Incomplete  Blood culture (routine x 2)     Status: None  (Preliminary result)   Collection Time: 11/11/14 10:27 AM  Result Value Ref Range Status   Specimen Description BLOOD LFT HAND  Final   Special Requests BOTTLES DRAWN AEROBIC AND ANAEROBIC 5CC  Final   Culture   Final           BLOOD CULTURE RECEIVED NO GROWTH TO DATE CULTURE WILL BE HELD FOR 5 DAYS BEFORE ISSUING A FINAL NEGATIVE REPORT Performed at [facility]    Report Status PENDING  Incomplete  MRSA PCR Screening     Status: None   Collection Time: 11/11/14  4:49 PM  Result Value Ref Range Status   MRSA by PCR NEGATIVE NEGATIVE Final    Comment:        The GeneXpert MRSA Assay (FDA approved for NASAL specimens only), is one component of a comprehensive MRSA colonization surveillance program. It is not intended to diagnose MRSA infection nor to guide or monitor treatment for MRSA infections.     Medical History: Past Medical History  Diagnosis Date  . Arthritis   . Wheezing   . Sore throat   . Carotid artery occlusion   . COPD (chronic obstructive pulmonary disease)   . Lumbar disc disease   . Restless leg syndrome   . Allergic rhinitis   . Hypertension   . Anxiety     anxiety  . Shortness of breath   . Lung mass   . GERD (gastroesophageal reflux disease)   .  H/O hiatal hernia   . Pre-operative cardiovascular examination   . Nonspecific abnormal unspecified cardiovascular function study   . Peripheral vascular disease, unspecified   . Pneumonia 2014    ?   Marland Kitchen On home oxygen therapy     prn  . Constipation   . Cancer 09/14/12    invasive mod diff squamous cell ca    Assessment: 81 YOM presents with multiple complaints and not feeling well. He has met [facility].  He was noted to have fever and tachypnea.  He was initially started on ceftriaxone with new orders for pharmacy to dose ceftazidime and vancomycin for HCAP and UTI.   5/3 >> ceftriaxone >> 5/4 >> vancomycin >> 5/4 >> ceftazidime >>  5/2 Urine: NG 5/2 Blood: NGTD Sputum: ordered (not  collected)  WBC elevated SCr WNL, norm CrCl = 55m/min Afebrile x 48h  Goal of Therapy:  Vancomycin trough level 15-20 mcg/ml  Plan:   Vancomycin 1gm IV q12h  Check vancomycin trough if remains on vancomycin   Follow renal function  Ceftazidime 2gm IV q8h   DDoreene Eland PharmD, BCPS.   Pager: [phone] 11/13/2014,11:46 AM

## 2014-11-13 NOTE — Progress Notes (Addendum)
[facility] PROGRESS NOTE   [name] HYW:[id number] DOB: [birth year] DOA: 11/11/2014 PCP: [name], MD  HPI/Subjective:  In bed, still mild shortness of breath, no headache or chest pain. Denies any belly pain or weakness   Assessment/Plan:   Sepsis secondary to UTI plus questionable pneumonia (HCAP)  Follow cultures, increase antibiotic to vancomycin and Fortaz to cover for HCAP. Sputum Gram stain cultures added. He needs supportive care with oxygen neb Shelda Pal treatments.   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 Patient is not on oxygen at home, appears to be doing very well, follows with [name] as outpatient.    Essential hypertension Continue home medications, appears to be controlled.    Left wrist swelling and redness Suspicious for gouty arthritis versus cellulitis. X-ray stable. Exam improved. Continue supportive care. Trial of colchicine.    Anemia of chronic disease.  Secondary to chemotherapy. Monitor.     Code Status: Full Code Family Communication: Plan discussed with the patient & wife. Disposition Plan: Remains inpatient Diet: Diet Heart Room service appropriate?: Yes; Fluid consistency:: Thin  Consultants:  None  Procedures:  None  Antibiotics:  None  DVT prophylaxis. Heparin   Objective: Filed Vitals:   11/13/14 0517  BP: 135/64  Pulse: 94  Temp: 98.4 F (36.9 C)  Resp: 20    Intake/Output Summary (Last 24 hours) at 11/13/14 1134 Last data filed at 11/12/14 2200  Gross per 24 hour  Intake    500 ml  Output      0 ml  Net    500 ml   Filed Weights   11/11/14 0851 11/11/14 1146  Weight: 77.111 kg (170 lb) 79.8 kg (175 lb 14.8 oz)     Exam:  General: Alert and awake, oriented x3, not in any acute distress. HEENT: anicteric sclera, pupils reactive to light and accommodation, EOMI CVS: S1-S2 clear, no murmur rubs or  gallops Chest: clear to auscultation bilaterally, no wheezing, rales or rhonchi Abdomen: soft nontender, nondistended, normal bowel sounds, no organomegaly Extremities: no cyanosis, clubbing or edema noted bilaterally Neuro: Cranial nerves II-XII intact, no focal neurological deficits   Data Reviewed: Basic Metabolic Panel:  Recent Labs Lab 11/11/14 0910 11/12/14 0330 11/13/14 0501  NA 131* 131* 132*  K 3.8 3.4* 3.7  CL 93* 100* 98*  CO2 '30 26 27  '$ GLUCOSE 113* 110* 108*  BUN 8 7 5*  CREATININE 0.78 0.80 0.73  CALCIUM 8.7* 8.0* 8.2*   Liver Function Tests:  Recent Labs Lab 11/11/14 0910  AST 21  ALT 27  ALKPHOS 128*  BILITOT 0.8  PROT 7.0  ALBUMIN 3.3*    Recent Labs Lab 11/11/14 0910  LIPASE 31   No results for input(s): AMMONIA in the last 168 hours. CBC:  Recent Labs Lab 11/11/14 0910 11/12/14 0330 11/13/14 0501  WBC 17.5* 16.3* 16.9*  NEUTROABS 14.5*  --   --   HGB 9.2* 7.7* 7.6*  HCT 27.9* 24.0* 23.5*  MCV 93.6 93.4 93.3  PLT 205 204 272   Cardiac Enzymes: No results for input(s): CKTOTAL, CKMB, CKMBINDEX, TROPONINI in the last 168 hours. BNP (last 3 results) No results for input(s): BNP in the last 8760 hours.  ProBNP (last 3 results)  Recent Labs  03/07/14 0918  PROBNP 18.0    CBG: No results for input(s): GLUCAP in the last 168 hours.  Micro Recent Results (from the past 240 hour(s))  Urine culture     Status: None   Collection Time: 11/11/14  9:00 AM  Result Value Ref Range Status   Specimen Description URINE, CLEAN CATCH  Final   Special Requests NONE  Final   Colony Count NO GROWTH Performed at [facility]   Final   Culture NO GROWTH Performed at [facility]   Final   Report Status 11/12/2014 FINAL  Final  Blood culture (routine x 2)     Status: None (Preliminary result)   Collection Time: 11/11/14 10:20 AM  Result Value Ref Range Status   Specimen Description BLOOD RT HAND  Final   Special Requests  BOTTLES DRAWN AEROBIC AND ANAEROBIC 5CC  Final   Culture   Final           BLOOD CULTURE RECEIVED NO GROWTH TO DATE CULTURE WILL BE HELD FOR 5 DAYS BEFORE ISSUING A FINAL NEGATIVE REPORT Performed at [facility]    Report Status PENDING  Incomplete  Blood culture (routine x 2)     Status: None (Preliminary result)   Collection Time: 11/11/14 10:27 AM  Result Value Ref Range Status   Specimen Description BLOOD LFT HAND  Final   Special Requests BOTTLES DRAWN AEROBIC AND ANAEROBIC 5CC  Final   Culture   Final           BLOOD CULTURE RECEIVED NO GROWTH TO DATE CULTURE WILL BE HELD FOR 5 DAYS BEFORE ISSUING A FINAL NEGATIVE REPORT Performed at [facility]    Report Status PENDING  Incomplete  MRSA PCR Screening     Status: None   Collection Time: 11/11/14  4:49 PM  Result Value Ref Range Status   MRSA by PCR NEGATIVE NEGATIVE Final    Comment:        The GeneXpert MRSA Assay (FDA approved for NASAL specimens only), is one component of a comprehensive MRSA colonization surveillance program. It is not intended to diagnose MRSA infection nor to guide or monitor treatment for MRSA infections.      Studies: Dg Chest 2 View  11/13/2014   CLINICAL DATA:  Shortness of breath, COPD, 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  Atherosclerotic calcification aorta.  Postsurgical changes at the RIGHT suprahilar region.  Question LEFT upper lobe infiltrate.  Underlying emphysematous changes.  Tenting and probable scarring at RIGHT lung base unchanged.  No pleural effusion or pneumothorax.  Bones diffusely demineralized.  IMPRESSION: Chronic lung changes and scarring in the RIGHT hemi thorax suspect related to previous surgery.  COPD changes with questionable LEFT upper lobe infiltrate medially.    Electronically Signed   By: Lavonia Dana M.D.   On: 11/13/2014 10:07   Dg Wrist 2 Views Left  11/11/2014   CLINICAL DATA:  Chronic left wrist pain and swelling posteriorly.  EXAM: LEFT WRIST - 2 VIEW  COMPARISON:  02/14/2010  FINDINGS: Osteoarthritic changes present of the carpal bones involving the scaphoid, lunate, and capitate articulation. These carpal bones demonstrate joint space loss, sclerosis and bony spurring. Distal radius and ulna appear intact. Mild dorsal soft tissue swelling on the lateral view. No acute fracture evident.  IMPRESSION: Osteoarthritis of the carpal bones, similar appearance compared to 2011.  No acute osseous finding.   Electronically Signed   By: Jerilynn Mages.  Shick M.D.   On: 11/11/2014 14:36  Scheduled Meds: . antiseptic oral rinse  7 mL Mouth Rinse BID  . aspirin EC  81 mg Oral Daily  . bisoprolol  5 mg Oral Daily  . cefTRIAXone (ROCEPHIN)  IV  1 g Intravenous Q24H  . guaiFENesin  1,200 mg Oral BID  . heparin  5,000 Units Subcutaneous 3 times per day  . ipratropium-albuterol  3 mL Nebulization QID  . isosorbide mononitrate  30 mg Oral Daily  . pantoprazole  40 mg Oral Daily  . pramipexole  1 mg Oral QHS  . sodium chloride  3 mL Intravenous Q12H   Continuous Infusions: . sodium chloride 75 mL/hr at 11/13/14 1045       Time spent: 35 minutes    Aldyn Toon K  [facility] Pager [phone] If 7PM-7AM, please contact night-coverage at [email or web address], password [facility] 11/13/2014, 11:34 AM  LOS: 2 days

## 2014-11-13 NOTE — Telephone Encounter (Signed)
Pt came in to cancel all visits due to pt is still in hospital and also request to cancel pt's MRI at GI, will call to cancel but pt will need to r/s once he gets out of the hospital....Marland KitchenMarland KitchenKJ

## 2014-11-13 NOTE — Progress Notes (Signed)
PT Cancellation Note  Patient Details Name: [name] MRN: [id number] DOB: [birth year]   Cancelled Treatment:    Reason Eval/Treat Not Completed: PT screened, no needs identified, will sign off. Spoke with pt/family-pt had just finished walking around unit with familiy. Both deny need for PT services. Please reorder if needs change.    Weston Anna, MPT Pager: [phone]

## 2014-11-13 NOTE — Progress Notes (Signed)
Pt reports increased SOB after ambulating to bathroom. Pt on 3 L. RT to give treatment. Saturations are 98%. Will continue to monitor.

## 2014-11-14 ENCOUNTER — Inpatient Hospital Stay: Admission: RE | Admit: 2014-11-14 | Payer: Medicare Other | Source: Ambulatory Visit

## 2014-11-14 ENCOUNTER — Encounter: Payer: Self-pay | Admitting: Radiation Oncology

## 2014-11-14 DIAGNOSIS — L03114 Cellulitis of left upper limb: Secondary | ICD-10-CM

## 2014-11-14 LAB — BASIC METABOLIC PANEL
Anion gap: 7 (ref 5–15)
BUN: 5 mg/dL — AB (ref 6–20)
CHLORIDE: 96 mmol/L — AB (ref 101–111)
CO2: 29 mmol/L (ref 22–32)
CREATININE: 0.64 mg/dL (ref 0.61–1.24)
Calcium: 8.3 mg/dL — ABNORMAL LOW (ref 8.9–10.3)
GFR calc Af Amer: >60 mL/min (ref >60)
GFR calc non Af Amer: >60 mL/min (ref >60)
Glucose, Bld: 114 mg/dL — ABNORMAL HIGH (ref 70–99)
Potassium: 3.5 mmol/L (ref 3.5–5.1)
SODIUM: 132 mmol/L — AB (ref 135–145)

## 2014-11-14 LAB — CBC
HCT: 23 % — ABNORMAL LOW (ref 39.0–52.0)
HEMOGLOBIN: 7.7 g/dL — AB (ref 13.0–17.0)
MCH: 31 pg (ref 26.0–34.0)
MCHC: 33.5 g/dL (ref 30.0–36.0)
MCV: 92.7 fL (ref 78.0–100.0)
Platelets: 340 10*3/uL (ref 150–400)
RBC: 2.48 MIL/uL — ABNORMAL LOW (ref 4.22–5.81)
RDW: 16.7 % — AB (ref 11.5–15.5)
WBC: 15.1 10*3/uL — ABNORMAL HIGH (ref 4.0–10.5)

## 2014-11-14 MED ORDER — METHYLPREDNISOLONE SODIUM SUCC 125 MG IJ SOLR
80.0000 mg | Freq: Every day | INTRAMUSCULAR | Status: DC
  Administered 2014-11-14: 80 mg via INTRAVENOUS
  Filled 2014-11-14: qty 2

## 2014-11-14 MED ORDER — LEVOFLOXACIN 750 MG PO TABS: 750.0000 mg | ORAL_TABLET | Freq: Every day | ORAL | Status: DC

## 2014-11-14 MED ORDER — COLCHICINE 0.6 MG PO TABS: 0.6000 mg | ORAL_TABLET | Freq: Every day | ORAL | Status: DC

## 2014-11-14 MED ORDER — PREDNISONE 5 MG PO TABS: ORAL_TABLET | ORAL | Status: DC

## 2014-11-14 NOTE — Progress Notes (Signed)
Discharge instructions reviewed with patient Edwyna Ready, RN. Patient discharged to home.

## 2014-11-14 NOTE — Progress Notes (Signed)
[name] to be D/C'd Home per MD order.  Discussed with the patient and all questions fully answered.  VSS, Skin clean, dry and intact without evidence of skin break down, no evidence of skin tears noted. IV catheter discontinued intact. Site without signs and symptoms of complications. Dressing and pressure applied.  An After Visit Summary was printed and given to the patient.   D/c education completed with patient/family including follow up instructions, medication list, d/c activities limitations if indicated, with other d/c instructions as indicated by MD - patient able to verbalize understanding, all questions fully answered.   Patient instructed to return to ED, call 911, or call MD for any changes in condition.    Vonna Kotyk 11/14/2014 10:48 AM

## 2014-11-14 NOTE — Discharge Instructions (Signed)
Follow with Primary MD FRIED, ROBERT L, MD in 3 days, follow final Blood Culture results.  Get CBC, CMP, 2 view Chest X ray checked  by Primary MD next visit.    Activity: As tolerated with Full fall precautions use walker/cane & assistance as needed   Disposition Home    Diet: Heart Healthy   For Heart failure patients - Check your Weight same time everyday, if you gain over 2 pounds, or you develop in leg swelling, experience more shortness of breath or chest pain, call your Primary MD immediately. Follow Cardiac Low Salt Diet and 1.5 lit/day fluid restriction.   On your next visit with your primary care physician please Get Medicines reviewed and adjusted.   Please request your Prim.MD to go over all Hospital Tests and Procedure/Radiological results at the follow up, please get all Hospital records sent to your Prim MD by signing hospital release before you go home.   If you experience worsening of your admission symptoms, develop shortness of breath, life threatening emergency, suicidal or homicidal thoughts you must seek medical attention immediately by calling 911 or calling your MD immediately  if symptoms less severe.  You Must read complete instructions/literature along with all the possible adverse reactions/side effects for all the Medicines you take and that have been prescribed to you. Take any new Medicines after you have completely understood and accpet all the possible adverse reactions/side effects.   Do not drive, operating heavy machinery, perform activities at heights, swimming or participation in water activities or provide baby sitting services if your were admitted for syncope or siezures until you have seen by Primary MD or a Neurologist and advised to do so again.  Do not drive when taking Pain medications.    Do not take more than prescribed Pain, Sleep and Anxiety Medications  Special Instructions: If you have smoked or chewed Tobacco  in the last 2 yrs  please stop smoking, stop any regular Alcohol  and or any Recreational drug use.  Wear Seat belts while driving.   Please note  You were cared for by a hospitalist during your hospital stay. If you have any questions about your discharge medications or the care you received while you were in the hospital after you are discharged, you can call the unit and asked to speak with the hospitalist on call if the hospitalist that took care of you is not available. Once you are discharged, your primary care physician will handle any further medical issues. Please note that NO REFILLS for any discharge medications will be authorized once you are discharged, as it is imperative that you return to your primary care physician (or establish a relationship with a primary care physician if you do not have one) for your aftercare needs so that they can reassess your need for medications and monitor your lab values.

## 2014-11-14 NOTE — Discharge Summary (Signed)
[name], is a 74 y.o. male  DOB 05/20/1941  MRN [id number].  Admission date:  11/11/2014  Admitting Physician  Darren Monte, MD  Discharge Date:  11/14/2014   Primary MD  Darren Miyamoto, MD  Recommendations for primary care physician for things to follow:   Please check CBC, BMP, final blood culture results. Two-view chest x-ray next visit  Outpatient oncology and pulmonary follow-up    Admission Diagnosis  UTI (lower urinary tract infection) [N39.0] SIRS (systemic inflammatory response syndrome) [A41.9] Left arm cellulitis [L03.114] Sepsis, due to unspecified organism [A41.9]   Discharge Diagnosis  UTI (lower urinary tract infection) [N39.0] SIRS (systemic inflammatory response syndrome) [A41.9] Left arm cellulitis [L03.114] Sepsis, due to unspecified organism [A41.9]    Principal Problem:   Sepsis Active Problems:   Lung cancer   Essential hypertension, benign   Brain metastasis   UTI (lower urinary tract infection)      Past Medical History  Diagnosis Date  . Arthritis   . Wheezing   . Sore throat   . Carotid artery occlusion   . COPD (chronic obstructive pulmonary disease)   . Lumbar disc disease   . Restless leg syndrome   . Allergic rhinitis   . Hypertension   . Anxiety     anxiety  . Shortness of breath   . Lung mass   . GERD (gastroesophageal reflux disease)   . H/O hiatal hernia   . Pre-operative cardiovascular examination   . Nonspecific abnormal unspecified cardiovascular function study   . Peripheral vascular disease, unspecified   . Pneumonia 2014    ?   Marland Kitchen On home oxygen therapy     prn  . Constipation   . Cancer 09/14/12    invasive mod diff squamous cell ca    Past Surgical History  Procedure Laterality Date  . Carotid endarterectomy  10/15/2010    left  .  Hand surgery      repair of the left long, ring, and small finger after a saw injury  . Video bronchoscopy  07/24/2012    Procedure: VIDEO BRONCHOSCOPY WITH FLUORO;  Surgeon: Kathee Delton, MD;  Location: [facility];  Service: Cardiopulmonary;  Laterality: Bilateral;  . Lobectomy Right 09/14/2012    Procedure: LOBECTOMY;  Surgeon: Ivin Poot, MD;  Location: [place];  Service: Thoracic;  Laterality: Right;  RIGHT UPPER LOBECTOMY  . Video assisted thoracoscopy Right 09/14/2012    Procedure: VIDEO ASSISTED THORACOSCOPY;  Surgeon: Ivin Poot, MD;  Location: [place];  Service: Thoracic;  Laterality: Right;  . Video bronchoscopy with endobronchial ultrasound N/A 08/27/2014    Procedure: VIDEO BRONCHOSCOPY WITH ENDOBRONCHIAL ULTRASOUND;  Surgeon: Ivin Poot, MD;  Location: [place];  Service: Thoracic;  Laterality: N/A;  . Scalene node biopsy Right 08/27/2014    Procedure: BIOPSY SCALENE NODE;  Surgeon: Ivin Poot, MD;  Location: [place];  Service: Thoracic;  Laterality: Right;  . Sterotactic radiosurgery Right 09/25/14    right parietal 30Gy/1 fx  History of present illness and  Hospital Course:     Kindly see H&P for history of present illness and admission details, please review complete Labs, Consult reports and Test reports for all details in brief  HPI  from the history and physical done on the day of admission  [name] is a 74 y.o. male with past medical history of metastatic non-small cell lung cancer came into the hospital complaining about multiple symptoms. Initially he reported having episodic shortness of breath and tachycardia comes and goes every now and then, and that is why he went to the emergency department. Then patient started also complaining about constipation, bloating and abdominal distention. Patient denies any fever, chills or chest pain. Has some cough with clear sputum production. In the ED abdominal and chest x-ray were negative for acute findings,  urinalysis consistent with UTI, patient appears to be constipated and has some bloating. Had fever of 101.1, respiratory rate of 42, [facility] [place] [facility] for [facility] evaluation.  Hospital Course    Sepsis secondary to UTI plus questionable pneumonia (HCAP)  So far negative cultures, had placed him on appropriate antibiotics vancomycin and Fortaz to cover for HCAP. Sputum Gram stain cultures unremarkable thus far. Does of IV Solu Medrol was provided as he had some wheezing, history requiring 2-3 L nasal cannula oxygen which he uses at home and he appears to be in mild-to-moderate distress, however he and his wife are adamant on being discharged home. They claim that this is how he is at baseline. We will place him on Levaquin for another week along with a steroid taper and request him to follow with PCP and pulmonary along with his oncologist on a close basis. I repeatedly requested him to stay for at least 1 more day but he adamantly refused wife bedside also refusing.     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 Continue with [name] as outpatient.    Essential hypertension Continue home medications, appears to be controlled.    Left wrist swelling and redness Suspicious for gouty arthritis . X-ray stable. Exam improved after trial of coaches seen which will be prescribed upon discharge.   Anemia of chronic disease.  Secondary to chemotherapy. Monitor in the outpatient setting post discharge by PCP.     Discharge Condition: Stable   Follow UP  Follow-up Information    Follow up with FRIED, ROBERT L, MD. Schedule an appointment as soon as possible for a visit in 3 days.   Specialty:  Family Medicine   Contact information:   [address] [place] [address] [phone]       Follow up with Eilleen Kempf., MD. Schedule an appointment as soon as possible for a visit in  1 week.   Specialty:  Oncology   Contact information:   [address] [place] [address] [phone]       Follow up with Rigoberto Noel., MD. Schedule an appointment as soon as possible for a visit in 1 week.   Specialty:  Pulmonary Disease   Contact information:   [address] [address] [phone]         Discharge Instructions  and  Discharge Medications          Discharge Instructions    Diet - low sodium heart healthy    Complete by:  As directed  Discharge instructions    Complete by:  As directed   Follow with Primary MD FRIED, ROBERT L, MD in 3 days, follow final Blood Culture results.  Get CBC, CMP, 2 view Chest X ray checked  by Primary MD next visit.    Activity: As tolerated with Full fall precautions use walker/cane & assistance as needed   Disposition Home    Diet: Heart Healthy   For Heart failure patients - Check your Weight same time everyday, if you gain over 2 pounds, or you develop in leg swelling, experience more shortness of breath or chest pain, call your Primary MD immediately. Follow Cardiac Low Salt Diet and 1.5 lit/day fluid restriction.   On your next visit with your primary care physician please Get Medicines reviewed and adjusted.   Please request your Prim.MD to go over all Hospital Tests and Procedure/Radiological results at the follow up, please get all Hospital records sent to your Prim MD by signing hospital release before you go home.   If you experience worsening of your admission symptoms, develop shortness of breath, life threatening emergency, suicidal or homicidal thoughts you must seek medical attention immediately by calling 911 or calling your MD immediately  if symptoms less severe.  You Must read complete instructions/literature along with all the possible adverse reactions/side effects for all the Medicines you take and that have been prescribed to you. Take any new Medicines after you have  completely understood and accpet all the possible adverse reactions/side effects.   Do not drive, operating heavy machinery, perform activities at heights, swimming or participation in water activities or provide baby sitting services if your were admitted for syncope or siezures until you have seen by Primary MD or a Neurologist and advised to do so again.  Do not drive when taking Pain medications.    Do not take more than prescribed Pain, Sleep and Anxiety Medications  Special Instructions: If you have smoked or chewed Tobacco  in the last 2 yrs please stop smoking, stop any regular Alcohol  and or any Recreational drug use.  Wear Seat belts while driving.   Please note  You were cared for by a hospitalist during your hospital stay. If you have any questions about your discharge medications or the care you received while you were in the hospital after you are discharged, you can call the unit and asked to speak with the hospitalist on call if the hospitalist that took care of you is not available. Once you are discharged, your primary care physician will handle any further medical issues. Please note that NO REFILLS for any discharge medications will be authorized once you are discharged, as it is imperative that you return to your primary care physician (or establish a relationship with a primary care physician if you do not have one) for your aftercare needs so that they can reassess your need for medications and monitor your lab values.     Increase activity slowly    Complete by:  As directed             Medication List    TAKE these medications        albuterol (2.5 MG/3ML) 0.083% nebulizer solution  Commonly known as:  PROVENTIL  Take 2.5 mg by nebulization every 6 (six) hours as needed for wheezing or shortness of breath.     aspirin EC 81 MG tablet  Take 81 mg by mouth daily.     bisoprolol 5 MG tablet  Commonly  known as:  ZEBETA  Take 1 tablet (5 mg total) by mouth  daily.     clonazePAM 0.5 MG tablet  Commonly known as:  KLONOPIN  Take 0.5 mg by mouth 2 (two) times daily as needed for anxiety. Takes one tablet by mouth two times daily as needed for anxiety.     colchicine 0.6 MG tablet  Take 1 tablet (0.6 mg total) by mouth daily.     HYDROcodone-acetaminophen 7.5-325 MG per tablet  Commonly known as:  NORCO  Take 1 tablet by mouth every 6 (six) hours as needed for moderate pain.     levofloxacin 750 MG tablet  Commonly known as:  LEVAQUIN  Take 1 tablet (750 mg total) by mouth daily.     LORazepam 0.5 MG tablet  Commonly known as:  ATIVAN  Take 1 tablet by mouth or sublingually every 8 hours as needed for nausea. May take 1 tablet by mouth at bedtime.     metoprolol tartrate 25 MG tablet  Commonly known as:  LOPRESSOR  Take 25 mg by mouth daily.     omeprazole 40 MG capsule  Commonly known as:  PRILOSEC  Take 40 mg by mouth daily.     pramipexole 1 MG tablet  Commonly known as:  MIRAPEX  Take 1 mg by mouth at bedtime.     predniSONE 5 MG tablet  Commonly known as:  DELTASONE  Label  & dispense according to the schedule below. 10 Pills PO for 3 days then, 8 Pills PO for 3 days, 6 Pills PO for 3 days, 4 Pills PO for 3 days, 2 Pills PO for 3 days, 1 Pills PO for 3 days, 1/2 Pill  PO for 3 days then STOP. Total 95 pills.     traMADol 50 MG tablet  Commonly known as:  ULTRAM  Take 1 tablet (50 mg total) by mouth every 12 (twelve) hours as needed.     zolpidem 10 MG tablet  Commonly known as:  AMBIEN  Take 10 mg by mouth at bedtime as needed for sleep.          Diet and Activity recommendation: See Discharge Instructions above   Consults obtained - None   Major procedures and Radiology Reports - PLEASE review detailed and final reports for all details, in brief -       Dg Chest 2 View  11/13/2014   CLINICAL DATA:  Shortness of breath, COPD, history of squamous cell carcinoma of the lung with brain metastasis,  hypertension, COPD, GERD  EXAM: CHEST  2 VIEW  COMPARISON:  11/11/2014  FINDINGS: Normal heart size, mediastinal contours and pulmonary vascularity.  Atherosclerotic calcification aorta.  Postsurgical changes at the RIGHT suprahilar region.  Question LEFT upper lobe infiltrate.  Underlying emphysematous changes.  Tenting and probable scarring at RIGHT lung base unchanged.  No pleural effusion or pneumothorax.  Bones diffusely demineralized.  IMPRESSION: Chronic lung changes and scarring in the RIGHT hemi thorax suspect related to previous surgery.  COPD changes with questionable LEFT upper lobe infiltrate medially.   Electronically Signed   By: Lavonia Dana M.D.   On: 11/13/2014 10:07   Dg Wrist 2 Views Left  11/11/2014   CLINICAL DATA:  Chronic left wrist pain and swelling posteriorly.  EXAM: LEFT WRIST - 2 VIEW  COMPARISON:  02/14/2010  FINDINGS: Osteoarthritic changes present of the carpal bones involving the scaphoid, lunate, and capitate articulation. These carpal bones demonstrate joint space loss, sclerosis and bony spurring.  Distal radius and ulna appear intact. Mild dorsal soft tissue swelling on the lateral view. No acute fracture evident.  IMPRESSION: Osteoarthritis of the carpal bones, similar appearance compared to 2011.  No acute osseous finding.   Electronically Signed   By: Jerilynn Mages.  Shick M.D.   On: 11/11/2014 14:36   Dg Abd Acute W/chest  11/11/2014   CLINICAL DATA:  Abdominal pain  EXAM: DG ABDOMEN ACUTE W/ 1V CHEST  COMPARISON:  09/30/2014, 10/07/2014  FINDINGS: Cardiac shadow is stable. Stable scarring is noted within the the lungs bilaterally. Stable nodular changes are again noted as well.  Scattered large and small bowel gas is noted. No free air is seen. No definitive obstructive changes are noted. Degenerative change of the lumbar spine is seen.  IMPRESSION: Chronic changes without acute abnormality.   Electronically Signed   By: Inez Catalina M.D.   On: 11/11/2014 09:32    Micro Results       Recent Results (from the past 240 hour(s))  Urine culture     Status: None   Collection Time: 11/11/14  9:00 AM  Result Value Ref Range Status   Specimen Description URINE, CLEAN CATCH  Final   Special Requests NONE  Final   Colony Count NO GROWTH Performed at [facility]   Final   Culture NO GROWTH Performed at [facility]   Final   Report Status 11/12/2014 FINAL  Final  Blood culture (routine x 2)     Status: None (Preliminary result)   Collection Time: 11/11/14 10:20 AM  Result Value Ref Range Status   Specimen Description BLOOD RT HAND  Final   Special Requests BOTTLES DRAWN AEROBIC AND ANAEROBIC 5CC  Final   Culture   Final           BLOOD CULTURE RECEIVED NO GROWTH TO DATE CULTURE WILL BE HELD FOR 5 DAYS BEFORE ISSUING A FINAL NEGATIVE REPORT Performed at [facility]    Report Status PENDING  Incomplete  Blood culture (routine x 2)     Status: None (Preliminary result)   Collection Time: 11/11/14 10:27 AM  Result Value Ref Range Status   Specimen Description BLOOD LFT HAND  Final   Special Requests BOTTLES DRAWN AEROBIC AND ANAEROBIC 5CC  Final   Culture   Final           BLOOD CULTURE RECEIVED NO GROWTH TO DATE CULTURE WILL BE HELD FOR 5 DAYS BEFORE ISSUING A FINAL NEGATIVE REPORT Performed at [facility]    Report Status PENDING  Incomplete  MRSA PCR Screening     Status: None   Collection Time: 11/11/14  4:49 PM  Result Value Ref Range Status   MRSA by PCR NEGATIVE NEGATIVE Final    Comment:        The GeneXpert MRSA Assay (FDA approved for NASAL specimens only), is one component of a comprehensive MRSA colonization surveillance program. It is not intended to diagnose MRSA infection nor to guide or monitor treatment for MRSA infections.   Culture, expectorated sputum-assessment     Status: None   Collection Time: 11/13/14  2:36 PM  Result Value Ref Range Status   Specimen Description SPUTUM  Final   Special  Requests NONE  Final   Sputum evaluation   Final    THIS SPECIMEN IS ACCEPTABLE. RESPIRATORY CULTURE REPORT TO FOLLOW.   Report Status 11/13/2014 FINAL  Final  Culture, respiratory (NON-Expectorated)     Status: None (Preliminary result)  Collection Time: 11/13/14  2:36 PM  Result Value Ref Range Status   Specimen Description SPUTUM  Final   Special Requests NONE  Final   Gram Stain   Final    NO WBC SEEN RARE SQUAMOUS EPITHELIAL CELLS PRESENT RARE GRAM NEGATIVE RODS RARE GRAM POSITIVE COCCI IN PAIRS    Culture PENDING  Incomplete   Report Status PENDING  Incomplete       Today   Subjective:   [name] today has no headache,no chest abdominal pain,no new weakness tingling or numbness, feels much better wants to go home today.    Objective:   Blood pressure 135/65, pulse 90, temperature 98.3 F (36.8 C), temperature source Oral, resp. rate 18, height '5\' 9"'$  (1.753 m), weight 79.8 kg (175 lb 14.8 oz), SpO2 99 %.   Intake/Output Summary (Last 24 hours) at 11/14/14 1008 Last data filed at 11/14/14 0532  Gross per 24 hour  Intake    650 ml  Output    700 ml  Net    -50 ml    Exam Awake Alert, Oriented x 3, No new F.N deficits, Normal affect Jensen.AT,PERRAL Supple Neck,No JVD, No cervical lymphadenopathy appriciated.  Symmetrical Chest wall movement, Mod air movement bilaterally, minimal wheezing RRR,No Gallops,Rubs or new Murmurs, No Parasternal Heave +ve B.Sounds, Abd Soft, Non tender, No organomegaly appriciated, No rebound -guarding or rigidity. No Cyanosis, Clubbing or edema, No new Rash or bruise  Data Review   CBC w Diff:  Lab Results  Component Value Date   WBC 15.1* 11/14/2014   WBC 17.1* 11/06/2014   HGB 7.7* 11/14/2014   HGB 9.0* 11/06/2014   HCT 23.0* 11/14/2014   HCT 28.4* 11/06/2014   PLT 340 11/14/2014   PLT 107* 11/06/2014   LYMPHOPCT 8* 11/11/2014   LYMPHOPCT 10.5* 11/06/2014   BANDSPCT 4 11/11/2014   MONOPCT 9 11/11/2014   MONOPCT 3.8  11/06/2014   EOSPCT 0 11/11/2014   EOSPCT 0.1 11/06/2014   BASOPCT 0 11/11/2014   BASOPCT 0.5 11/06/2014    CMP:  Lab Results  Component Value Date   NA 132* 11/14/2014   NA 136 11/06/2014   K 3.5 11/14/2014   K 3.7 11/06/2014   CL 96* 11/14/2014   CL 101 12/28/2012   CO2 29 11/14/2014   CO2 27 11/06/2014   BUN 5* 11/14/2014   BUN 5.7* 11/06/2014   CREATININE 0.64 11/14/2014   CREATININE 0.8 11/06/2014   PROT 7.0 11/11/2014   PROT 6.3* 11/06/2014   ALBUMIN 3.3* 11/11/2014   ALBUMIN 3.3* 11/06/2014   BILITOT 0.8 11/11/2014   BILITOT 0.26 11/06/2014   ALKPHOS 128* 11/11/2014   ALKPHOS 134 11/06/2014   AST 21 11/11/2014   AST 26 11/06/2014   ALT 27 11/11/2014   ALT 46 11/06/2014  .   Total Time in preparing paper work, data evaluation and todays exam - 35 minutes  Thurnell Lose M.D on 11/14/2014 at 10:08 AM  [facility]   Office  [phone]

## 2014-11-15 ENCOUNTER — Telehealth: Payer: Self-pay | Admitting: Internal Medicine

## 2014-11-15 ENCOUNTER — Telehealth: Payer: Self-pay | Admitting: *Deleted

## 2014-11-15 NOTE — Telephone Encounter (Signed)
TC from pt's wife. Patient was discharged from the hospital yesterday and is scheduled for labs and chemo next Wednesday the 11th. Wife was wondering if patient will be having chemo then. Informed wife that [name] will need to see him before his chemo. Will forward message to [name] and his nurse, [name].

## 2014-11-15 NOTE — Telephone Encounter (Signed)
returned call and s.w. pt wife and transferred to nurse they wanted to know if he needed to keep appt due to taking atibiotics for uti

## 2014-11-16 LAB — CULTURE, RESPIRATORY W GRAM STAIN

## 2014-11-16 LAB — CULTURE, RESPIRATORY
CULTURE: NORMAL
GRAM STAIN: NONE SEEN

## 2014-11-17 LAB — CULTURE, BLOOD (ROUTINE X 2)
CULTURE: NO GROWTH
Culture: NO GROWTH

## 2014-11-18 ENCOUNTER — Telehealth: Payer: Self-pay | Admitting: *Deleted

## 2014-11-18 ENCOUNTER — Ambulatory Visit: Admission: RE | Admit: 2014-11-18 | Payer: Medicare Other | Source: Ambulatory Visit | Admitting: Radiation Oncology

## 2014-11-18 DIAGNOSIS — C7931 Secondary malignant neoplasm of brain: Secondary | ICD-10-CM

## 2014-11-18 DIAGNOSIS — C3430 Malignant neoplasm of lower lobe, unspecified bronchus or lung: Secondary | ICD-10-CM

## 2014-11-18 HISTORY — DX: Personal history of irradiation: Z92.3

## 2014-11-18 NOTE — Telephone Encounter (Signed)
NO ONE HAS CONTACTED PT.'S [place]. SPOKE TO THE CHARGE NURSE ON THE FLOOR PT. WAS ASSIGNED AT [facility]. SHE STATES NO HOME HEALTH WAS ORDERED FOR PT. VERBAL ORDER AND READ BACK TO DR.MOHAMED-PT.'S WIFE NEEDS TO CHECK WITH PT.'S PRIMARY CARE PHYSICIAN CONCERNING HOME HEALTH. NOTIFIED PT.'S WIFE. SHE SAID PT. WAS SEEN TODAY AT HIS PRIMARY CARE PHYSICIAN'S OFFICE. PT.'S WIFE ASKED ABOUT HOME HEALTH AND WAS TOLD THE ONCOLOGIST WOULD BEST KNOW THE PT.'S NEEDS SO WILL ORDER THE HOME HEALTH. PT.WILL BE IN THE OFFICE ON 11/20/14 FOR LAB AND INFUSION. PT. MAY ALSO NEED A FOLLOW VISIT SINCE HE HAS BEEN IN THE HOSPITAL.

## 2014-11-19 ENCOUNTER — Ambulatory Visit (HOSPITAL_BASED_OUTPATIENT_CLINIC_OR_DEPARTMENT_OTHER): Payer: Medicare Other

## 2014-11-19 ENCOUNTER — Ambulatory Visit (HOSPITAL_COMMUNITY): Payer: Medicare Other

## 2014-11-19 ENCOUNTER — Telehealth: Payer: Self-pay | Admitting: Internal Medicine

## 2014-11-19 ENCOUNTER — Ambulatory Visit (HOSPITAL_BASED_OUTPATIENT_CLINIC_OR_DEPARTMENT_OTHER): Payer: Medicare Other | Admitting: Physician Assistant

## 2014-11-19 ENCOUNTER — Telehealth: Payer: Self-pay | Admitting: Pulmonary Disease

## 2014-11-19 ENCOUNTER — Ambulatory Visit (HOSPITAL_COMMUNITY)
Admission: RE | Admit: 2014-11-19 | Discharge: 2014-11-19 | Disposition: A | Discharge status: Home / Self Care | Payer: Medicare Other | Source: Ambulatory Visit | Attending: Internal Medicine | Admitting: Internal Medicine

## 2014-11-19 ENCOUNTER — Other Ambulatory Visit: Payer: Self-pay | Admitting: *Deleted

## 2014-11-19 VITALS — BP 171/90 | HR 92 | Temp 98.0°F | Resp 22 | Ht 69.0 in | Wt 172.6 lb

## 2014-11-19 DIAGNOSIS — C3490 Malignant neoplasm of unspecified part of unspecified bronchus or lung: Secondary | ICD-10-CM

## 2014-11-19 DIAGNOSIS — D6489 Other specified anemias: Secondary | ICD-10-CM

## 2014-11-19 DIAGNOSIS — C3411 Malignant neoplasm of upper lobe, right bronchus or lung: Secondary | ICD-10-CM | POA: Diagnosis present

## 2014-11-19 DIAGNOSIS — C7971 Secondary malignant neoplasm of right adrenal gland: Secondary | ICD-10-CM

## 2014-11-19 DIAGNOSIS — C3410 Malignant neoplasm of upper lobe, unspecified bronchus or lung: Secondary | ICD-10-CM | POA: Diagnosis not present

## 2014-11-19 DIAGNOSIS — J449 Chronic obstructive pulmonary disease, unspecified: Secondary | ICD-10-CM

## 2014-11-19 LAB — CBC WITH DIFFERENTIAL/PLATELET
BASO%: 0.4 % (ref 0.0–2.0)
BASOS ABS: 0.1 10*3/uL (ref 0.0–0.1)
EOS ABS: 0 10*3/uL (ref 0.0–0.5)
EOS%: 0.1 % (ref 0.0–7.0)
HCT: 28.4 % — ABNORMAL LOW (ref 38.4–49.9)
HEMOGLOBIN: 9.4 g/dL — AB (ref 13.0–17.1)
LYMPH%: 14.1 % (ref 14.0–49.0)
MCH: 31 pg (ref 27.2–33.4)
MCHC: 33.2 g/dL (ref 32.0–36.0)
MCV: 93.2 fL (ref 79.3–98.0)
MONO#: 1.9 10*3/uL — ABNORMAL HIGH (ref 0.1–0.9)
MONO%: 12.9 % (ref 0.0–14.0)
NEUT%: 72.5 % (ref 39.0–75.0)
NEUTROS ABS: 10.6 10*3/uL — AB (ref 1.5–6.5)
PLATELETS: 939 10*3/uL — AB (ref 140–400)
RBC: 3.05 10*6/uL — ABNORMAL LOW (ref 4.20–5.82)
RDW: 18 % — ABNORMAL HIGH (ref 11.0–14.6)
WBC: 14.6 10*3/uL — ABNORMAL HIGH (ref 4.0–10.3)
lymph#: 2.1 10*3/uL (ref 0.9–3.3)

## 2014-11-19 LAB — COMPREHENSIVE METABOLIC PANEL (CC13)
ALT: 47 U/L (ref 0–55)
ANION GAP: 12 meq/L — AB (ref 3–11)
AST: 20 U/L (ref 5–34)
Albumin: 2.9 g/dL — ABNORMAL LOW (ref 3.5–5.0)
Alkaline Phosphatase: 97 U/L (ref 40–150)
BUN: 10.3 mg/dL (ref 7.0–26.0)
CO2: 31 mEq/L — ABNORMAL HIGH (ref 22–29)
Calcium: 9.1 mg/dL (ref 8.4–10.4)
Chloride: 94 mEq/L — ABNORMAL LOW (ref 98–109)
Creatinine: 0.7 mg/dL (ref 0.7–1.3)
GLUCOSE: 95 mg/dL (ref 70–140)
Potassium: 4.2 mEq/L (ref 3.5–5.1)
Sodium: 137 mEq/L (ref 136–145)
Total Protein: 6.3 g/dL — ABNORMAL LOW (ref 6.4–8.3)

## 2014-11-19 LAB — ABO/RH: ABO/RH(D): A POS

## 2014-11-19 LAB — HOLD TUBE, BLOOD BANK

## 2014-11-19 NOTE — Telephone Encounter (Signed)
Home Health referral sent 5/9, pt to have lab and possible RBC transfusion today at 1130 in sickle cell unit. HAR called in, orders entered, lab appt for  1030 am pt to be typed and cross.  pof to scheduling. Call placed to Mrs Mula, reviewed PT note 5/2 ;states pt and wife declined need for PT;  as a result no referral was made at that time to [facility]. Discussed with pt's wife Hgb 7.7 upon discharge pt to come in today for labs and possible transfusion at sickle cell. Trying to get pt an appt with provider as well 5/10 or 5/11 prior to infusion appt.

## 2014-11-19 NOTE — Telephone Encounter (Signed)
Called and spoke to pt's wife. Pt received letter that [facility] is leaving. Pt is requesting to switch to [facility].   Dr. Elsworth Soho please advise if you are ok taking this pt on. Thanks.

## 2014-11-19 NOTE — Telephone Encounter (Signed)
Added appts...per pt wife she will get appts from [facility].

## 2014-11-19 NOTE — Telephone Encounter (Signed)
Pt scheduled for next available appt with [facility].  Nothing further needed.

## 2014-11-19 NOTE — Telephone Encounter (Signed)
Okay to make routine follow-up appointment

## 2014-11-20 ENCOUNTER — Other Ambulatory Visit: Payer: Medicare Other

## 2014-11-20 ENCOUNTER — Ambulatory Visit (HOSPITAL_BASED_OUTPATIENT_CLINIC_OR_DEPARTMENT_OTHER): Payer: Medicare Other

## 2014-11-20 VITALS — BP 152/80 | HR 85 | Temp 97.7°F

## 2014-11-20 DIAGNOSIS — Z5111 Encounter for antineoplastic chemotherapy: Secondary | ICD-10-CM | POA: Diagnosis present

## 2014-11-20 DIAGNOSIS — C3411 Malignant neoplasm of upper lobe, right bronchus or lung: Secondary | ICD-10-CM | POA: Diagnosis not present

## 2014-11-20 LAB — PREPARE RBC (CROSSMATCH)

## 2014-11-20 MED ORDER — SODIUM CHLORIDE 0.9 % IV SOLN
Freq: Once | INTRAVENOUS | Status: AC
  Administered 2014-11-20: 09:00:00 via INTRAVENOUS

## 2014-11-20 MED ORDER — SODIUM CHLORIDE 0.9 % IV SOLN
502.0000 mg | Freq: Once | INTRAVENOUS | Status: AC
  Administered 2014-11-20: 500 mg via INTRAVENOUS
  Filled 2014-11-20: qty 50

## 2014-11-20 MED ORDER — SODIUM CHLORIDE 0.9 % IV SOLN
2000.0000 mg | Freq: Once | INTRAVENOUS | Status: AC
  Administered 2014-11-20: 2000 mg via INTRAVENOUS
  Filled 2014-11-20: qty 52.6

## 2014-11-20 MED ORDER — DEXAMETHASONE SODIUM PHOSPHATE 100 MG/10ML IJ SOLN
Freq: Once | INTRAMUSCULAR | Status: AC
  Administered 2014-11-20: 10:00:00 via INTRAVENOUS
  Filled 2014-11-20: qty 8

## 2014-11-20 MED ORDER — CARBOPLATIN CHEMO INTRADERMAL TEST DOSE 100MCG/0.02ML
100.0000 ug | Freq: Once | INTRADERMAL | Status: AC
  Administered 2014-11-20: 100 ug via INTRADERMAL
  Filled 2014-11-20: qty 0.01

## 2014-11-20 NOTE — Progress Notes (Addendum)
[facility] [phone]   [phone]  OFFICE PROGRESS NOTE  [name], MD [address] [place] [address]  DIAGNOSIS: Metastatic non-small cell lung cancer, squamous cell carcinoma initially diagnosed as Stage IIA (T2b., N0, M0) non-small cell lung cancer, squamous cell carcinoma diagnosed in December of 2013   PRIOR THERAPY:  1) Status post right upper lobectomy under the care of Dr. Prescott Gum on 09/14/2012.  2) Adjuvant chemotherapy with cisplatin 75 mg/M2 and docetaxel 75 mg/M2 with Neulasta support every 3 weeks, status post 1 cycle. Starting with cycle #2, patient will receive carboplatin for an AUC initially of 4.5 and paclitaxel at 175 mg per meter squared with Neulasta support given every 3 weeks. Status post a total of 3 cycles.   CURRENT THERAPY: Systemic chemotherapy with carboplatin for AUC of 5 on day 1 and gemcitabine 1000 MG/M2 on days 1 and 8 every 3 weeks. First dose 09/11/2014. Status post 3 cycles  CHEMOTHERAPY INTENT: Palliative. CURRENT # OF CHEMOTHERAPY CYCLES: 4 CURRENT ANTIEMETICS: Zofran, dexamethasone and Compazine  CURRENT SMOKING STATUS: Quit smoking recently.  ORAL CHEMOTHERAPY AND CONSENT: None  CURRENT BISPHOSPHONATES USE: None  PAIN MANAGEMENT: 3/10 controlled by Norco when necessary  NARCOTICS INDUCED CONSTIPATION: Milk of magnesia on as-needed basis.  LIVING WILL AND CODE STATUS: No CODE BLUE.   INTERVAL HISTORY:  ELTON HEID 74 y.o. male returns to the clinic today for followup visit accompanied his wife. The patient is currently undergoing systemic chemotherapy with carboplatin and gemcitabine status post 3 cycles. He was recently discharged from the hospital where he was admitted for urinary tract infection/sepsis as well as questionable gout flare involving his left wrist versus cellulitis. He complains of increased shortness of breath and is currently on 2-3 L of oxygen via nasal cannula  on a daily basis as opposed to intermittently. He reports decreased appetite and has lost a few pounds recently. He denied having any significant chest pain but has mild cough and no hemoptysis. He quit smoking recently. He denied having any significant nausea or vomiting, no fever or chills, no night sweats. He continues to have intermittent headaches that he treats with tramadol with reasonable relief. Patient his wife are concerned as to how much is treatment is taking out of him. If the treatment is working they of course want to continue however, if it is not they would like to know that as well.  MEDICAL HISTORY: Past Medical History  Diagnosis Date  . Arthritis   . Wheezing   . Sore throat   . Carotid artery occlusion   . COPD (chronic obstructive pulmonary disease)   . Lumbar disc disease   . Restless leg syndrome   . Allergic rhinitis   . Hypertension   . Anxiety     anxiety  . Shortness of breath   . Lung mass   . GERD (gastroesophageal reflux disease)   . H/O hiatal hernia   . Pre-operative cardiovascular examination   . Nonspecific abnormal unspecified cardiovascular function study   . Peripheral vascular disease, unspecified   . Pneumonia 2014    ?   Marland Kitchen On home oxygen therapy     prn  . Constipation   . Cancer 09/14/12    invasive mod diff squamous cell ca  . S/P radiation therapy 09/25/14    brain mets 20Gy    ALLERGIES:  has No Known Allergies.  MEDICATIONS:  Current Outpatient Prescriptions  Medication Sig Dispense Refill  . albuterol (PROVENTIL) (2.5 MG/3ML) 0.083% nebulizer solution Take 2.5 mg by nebulization every 6 (six) hours as needed for wheezing or shortness of breath.    Marland Kitchen aspirin EC 81 MG tablet Take 81 mg by mouth daily.     . clonazePAM (KLONOPIN) 0.5 MG tablet Take 0.5 mg by mouth 2 (two) times daily as needed for anxiety. Takes one tablet by mouth two times daily as needed for anxiety.    . colchicine 0.6 MG tablet Take 1 tablet (0.6 mg total) by  mouth daily. 30 tablet 0  . HYDROcodone-acetaminophen (NORCO) 7.5-325 MG per tablet Take 1 tablet by mouth every 6 (six) hours as needed for moderate pain. 30 tablet 0  . levofloxacin (LEVAQUIN) 750 MG tablet Take 1 tablet (750 mg total) by mouth daily. 7 tablet 0  . LORazepam (ATIVAN) 0.5 MG tablet Take 1 tablet by mouth or sublingually every 8 hours as needed for nausea. May take 1 tablet by mouth at bedtime. 40 tablet 0  . metoprolol tartrate (LOPRESSOR) 25 MG tablet Take 25 mg by mouth daily.    Marland Kitchen omeprazole (PRILOSEC) 40 MG capsule Take 40 mg by mouth daily.    . pramipexole (MIRAPEX) 1 MG tablet Take 1 mg by mouth at bedtime.    . predniSONE (DELTASONE) 5 MG tablet Label  & dispense according to the schedule below. 10 Pills PO for 3 days then, 8 Pills PO for 3 days, 6 Pills PO for 3 days, 4 Pills PO for 3 days, 2 Pills PO for 3 days, 1 Pills PO for 3 days, 1/2 Pill  PO for 3 days then STOP. Total 95 pills. 95 tablet 0  . traMADol (ULTRAM) 50 MG tablet Take 1 tablet (50 mg total) by mouth every 12 (twelve) hours as needed. 30 tablet 0  . zolpidem (AMBIEN) 10 MG tablet Take 10 mg by mouth at bedtime as needed for sleep.     No current facility-administered medications for this visit.   Facility-Administered Medications Ordered in Other Visits  Medication Dose Route Frequency Provider Last Rate Last Dose  . 0.9 %  sodium chloride infusion   Intravenous Once Curt Bears, MD      . CARBOplatin (PARAPLATIN) 500 mg in sodium chloride 0.9 % 250 mL chemo infusion  500 mg Intravenous Once Curt Bears, MD      . CARBOplatin CHEMO intradermal Test Dose 100 mcg/0.76m  100 mcg Intradermal Once MCurt Bears MD      . Gemcitabine HCl (GEMZAR) 1,976 mg in sodium chloride 0.9 % 100 mL chemo infusion  1,000 mg/m2 (Treatment Plan Actual) Intravenous Once MCurt Bears MD      . ondansetron (ZOFRAN) 16 mg, dexamethasone (DECADRON) 20 mg in sodium chloride 0.9 % 50 mL IVPB   Intravenous Once  MCurt Bears MD        SURGICAL HISTORY:  Past Surgical History  Procedure Laterality Date  . Carotid endarterectomy  10/15/2010    left  . Hand surgery      repair of the left long, ring, and small finger after a saw injury  . Video bronchoscopy  07/24/2012    Procedure: VIDEO BRONCHOSCOPY WITH FLUORO;  Surgeon: KKathee Delton MD;  [facility];  Service: Cardiopulmonary;  Laterality: Bilateral;  . Lobectomy Right 09/14/2012    Procedure: LOBECTOMY;  Surgeon: PIvin Poot MD;  [facility]  Service: Thoracic;  Laterality: Right;  RIGHT UPPER LOBECTOMY  . Video assisted  thoracoscopy Right 09/14/2012    Procedure: VIDEO ASSISTED THORACOSCOPY;  Surgeon: Ivin Poot, MD;  Location: [facility];  Service: Thoracic;  Laterality: Right;  . Video bronchoscopy with endobronchial ultrasound N/A 08/27/2014    Procedure: VIDEO BRONCHOSCOPY WITH ENDOBRONCHIAL ULTRASOUND;  Surgeon: Ivin Poot, MD;  Location: [facility];  Service: Thoracic;  Laterality: N/A;  . Scalene node biopsy Right 08/27/2014    Procedure: BIOPSY SCALENE NODE;  Surgeon: Ivin Poot, MD;  Location[facility];  Service: Thoracic;  Laterality: Right;  . Sterotactic radiosurgery Right 09/25/14    right parietal 30Gy/1 fx    REVIEW OF SYSTEMS:  Constitutional: positive for anorexia, fatigue and weight loss Eyes: negative Ears, nose, mouth, throat, and face: negative Respiratory: positive for cough, dyspnea on exertion, wheezing and Increased dyspnea recently Cardiovascular: negative Gastrointestinal: negative Genitourinary:negative Integument/breast: negative Hematologic/lymphatic: negative Musculoskeletal:negative Neurological: negative Behavioral/Psych: negative Endocrine: negative Allergic/Immunologic: negative   PHYSICAL EXAMINATION: General appearance: alert, cooperative, fatigued and no distress Head: Normocephalic, without obvious abnormality, atraumatic Neck: no adenopathy, no JVD, supple, symmetrical,  trachea midline and thyroid not enlarged, symmetric, no tenderness/mass/nodules Lymph nodes: Cervical, supraclavicular, and axillary nodes normal. Resp: wheezes bilaterally Back: symmetric, no curvature. ROM normal. No CVA tenderness. Cardio: regular rate and rhythm, S1, S2 normal, no murmur, click, rub or gallop GI: soft, non-tender; bowel sounds normal; no masses,  no organomegaly Extremities: extremities normal, atraumatic, no cyanosis or edema Neurologic: Alert and oriented X 3, normal strength and tone. Normal symmetric reflexes. Normal coordination and gait  ECOG PERFORMANCE STATUS: 1 - Symptomatic but completely ambulatory  Blood pressure 171/90, pulse 92, temperature 98 F (36.7 C), temperature source Oral, resp. rate 22, height '5\' 9"'$  (1.753 m), weight 172 lb 9.6 oz (78.291 kg), SpO2 94 %.  LABORATORY DATA: Lab Results  Component Value Date   WBC 14.6* 11/19/2014   HGB 9.4* 11/19/2014   HCT 28.4* 11/19/2014   MCV 93.2 11/19/2014   PLT 939* 11/19/2014      Chemistry      Component Value Date/Time   NA 137 11/19/2014 1007   NA 132* 11/14/2014 0535   K 4.2 11/19/2014 1007   K 3.5 11/14/2014 0535   CL 96* 11/14/2014 0535   CL 101 12/28/2012 0852   CO2 31* 11/19/2014 1007   CO2 29 11/14/2014 0535   BUN 10.3 11/19/2014 1007   BUN 5* 11/14/2014 0535   CREATININE 0.7 11/19/2014 1007   CREATININE 0.64 11/14/2014 0535      Component Value Date/Time   CALCIUM 9.1 11/19/2014 1007   CALCIUM 8.3* 11/14/2014 0535   ALKPHOS 97 11/19/2014 1007   ALKPHOS 128* 11/11/2014 0910   AST 20 11/19/2014 1007   AST 21 11/11/2014 0910   ALT 47 11/19/2014 1007   ALT 27 11/11/2014 0910   BILITOT <0.20 11/19/2014 1007   BILITOT 0.8 11/11/2014 0910       RADIOGRAPHIC STUDIES: Dg Chest 2 View  11/13/2014   CLINICAL DATA:  Shortness of breath, COPD, history of squamous cell carcinoma of the lung with brain metastasis, hypertension, COPD, GERD  EXAM: CHEST  2 VIEW  COMPARISON:  11/11/2014   FINDINGS: Normal heart size, mediastinal contours and pulmonary vascularity.  Atherosclerotic calcification aorta.  Postsurgical changes at the RIGHT suprahilar region.  Question LEFT upper lobe infiltrate.  Underlying emphysematous changes.  Tenting and probable scarring at RIGHT lung base unchanged.  No pleural effusion or pneumothorax.  Bones diffusely demineralized.  IMPRESSION: Chronic lung changes and scarring in  the RIGHT hemi thorax suspect related to previous surgery.  COPD changes with questionable LEFT upper lobe infiltrate medially.   Electronically Signed   By: Lavonia Dana M.D.   On: 11/13/2014 10:07   Dg Wrist 2 Views Left  11/11/2014   CLINICAL DATA:  Chronic left wrist pain and swelling posteriorly.  EXAM: LEFT WRIST - 2 VIEW  COMPARISON:  02/14/2010  FINDINGS: Osteoarthritic changes present of the carpal bones involving the scaphoid, lunate, and capitate articulation. These carpal bones demonstrate joint space loss, sclerosis and bony spurring. Distal radius and ulna appear intact. Mild dorsal soft tissue swelling on the lateral view. No acute fracture evident.  IMPRESSION: Osteoarthritis of the carpal bones, similar appearance compared to 2011.  No acute osseous finding.   Electronically Signed   By: Jerilynn Mages.  Shick M.D.   On: 11/11/2014 14:36   Dg Abd Acute W/chest  11/11/2014   CLINICAL DATA:  Abdominal pain  EXAM: DG ABDOMEN ACUTE W/ 1V CHEST  COMPARISON:  09/30/2014, 10/07/2014  FINDINGS: Cardiac shadow is stable. Stable scarring is noted within the the lungs bilaterally. Stable nodular changes are again noted as well.  Scattered large and small bowel gas is noted. No free air is seen. No definitive obstructive changes are noted. Degenerative change of the lumbar spine is seen.  IMPRESSION: Chronic changes without acute abnormality.   Electronically Signed   By: Inez Catalina M.D.   On: 11/11/2014 09:32    ASSESSMENT AND PLAN: This is a very pleasant 74 years old white male with history of  stage IIa non-small cell lung cancer status post right upper lobectomy followed by 4 cycles of adjuvant chemotherapy.  He now presented with evidence for significant disease recurrence including lymphangitic spread of the tumor in addition to bilateral hilar and mediastinal lymphadenopathy in addition to right axillary or right subpectoral and right supraclavicular lymphadenopathy and right sided adrenal metastasis. He also has a very small brain lesion measuring 2 mm. The patient is currently undergoing systemic chemotherapy with carboplatin and gemcitabine status post 3 cycles and tolerating the treatment well except for increased shortness of breath and fatigue. His most recent CT scan of the chest showed no evidence for disease progression. Patient was discussed with and also seen by [name]. We recommend he proceed with cycle #4 of his systemic chemotherapy with carboplatin and gemcitabine as scheduled. We will plan to do another restaging CT scan after cycle #4. Should he continue to have stable disease or improvement he will continue with his current systemic chemotherapy with carboplatin and gemcitabine. If he has evidence for disease progression, the patient will be considered for immunotherapy. Patient and his wife were in agreement with this plan.   The patient would come back for follow-up visit in 3 weeks with the next cycle of his treatment with a restaging CT scan of the chest, abdomen and pelvis with contrast to reevaluate his disease. For the headache, patient is continue on tramadol 50 mg by mouth 2 times a day as needed.  He was advised to call immediately if he has any concerning symptoms in the interval.  The patient voices understanding of current disease status and treatment options and is in agreement with the current care plan.  All questions were answered. The patient knows to call the clinic with any problems, questions or concerns. We can certainly see the patient much  sooner if necessary.  Carlton Adam PA-C  ADDENDUM: Hematology/Oncology Attending: I had a face to face  encounter with the patient. I recommended his care plan. This is a very pleasant 74 years old white male with metastatic non-small cell lung cancer, squamous cell carcinoma was currently undergoing systemic chemotherapy with carboplatin and gemcitabine status post 3 cycles. His scan after cycle #2 showed no evidence for disease progression. The patient was recently admitted to [facility] for treatment of urinary tract infection as well as left wrist cellulitis. He is feeling a little bit better but continues to have increasing fatigue and weakness as well as shortness of breath at baseline and increased with exertion. I recommended for the patient to proceed with cycle #3 tomorrow as a scheduled. I will arrange for the patient to have repeat imaging studies with CT scan of the chest, abdomen and pelvis after completion of cycle #4. If the patient has any evidence for disease progression, he may be considered for second line therapy with immunotherapy. He would come back for follow-up visit in 3 weeks for reevaluation and discussion of his scan results as well as treatment options. For the chemotherapy-induced anemia, we will continue to monitor the patient closely and consider him for PRBCs transfusion if needed. He was advised to call immediately if he has any concerning symptoms in the interval.  Disclaimer: This note was dictated with voice recognition software. Similar sounding words can inadvertently be transcribed and may be missed upon review. Eilleen Kempf., MD 11/20/2014

## 2014-11-20 NOTE — Patient Instructions (Signed)
[facility] Discharge Instructions for Patients Receiving Chemotherapy  Today you received the following chemotherapy agents :  Gemzar/Carboplatin  To help prevent nausea and vomiting after your treatment, we encourage you to take your nausea medicationIf you develop nausea and vomiting that is not controlled by your nausea medication, call the clinic.   BELOW ARE SYMPTOMS THAT SHOULD BE REPORTED IMMEDIATELY:  *FEVER GREATER THAN 100.5 F  *CHILLS WITH OR WITHOUT FEVER  NAUSEA AND VOMITING THAT IS NOT CONTROLLED WITH YOUR NAUSEA MEDICATION  *UNUSUAL SHORTNESS OF BREATH  *UNUSUAL BRUISING OR BLEEDING  TENDERNESS IN MOUTH AND THROAT WITH OR WITHOUT PRESENCE OF ULCERS  *URINARY PROBLEMS  *BOWEL PROBLEMS  UNUSUAL RASH Items with * indicate a potential emergency and should be followed up as soon as possible.  Feel free to call the clinic you have any questions or concerns. The clinic phone number is [phone].  Please show the CHEMO ALERT CARD at check-in to the Emergency Department and triage nurse.   

## 2014-11-20 NOTE — Patient Instructions (Signed)
Continue with labs and chemotherapy as scheduled Follow-up in 3 weeks with restaging CT scan of the chest, abdomen and pelvis to reevaluate your disease

## 2014-11-21 ENCOUNTER — Telehealth: Payer: Self-pay | Admitting: Interventional Cardiology

## 2014-11-21 LAB — TYPE AND SCREEN
ABO/RH(D): A POS
Antibody Screen: NEGATIVE
UNIT DIVISION: 0
UNIT DIVISION: 0

## 2014-11-21 MED ORDER — AMLODIPINE BESYLATE 5 MG PO TABS: 5.0000 mg | ORAL_TABLET | Freq: Every day | ORAL | Status: DC

## 2014-11-21 NOTE — Telephone Encounter (Signed)
Spoke with wife and informed her of new order for Amlodipine '5mg'$  QD for high BP and to continue metoprolol as well. Verified pharmacy and informed wife that I would send over prescription. Wife verbalized understanding and was in agreement with this plan.

## 2014-11-21 NOTE — Telephone Encounter (Signed)
New message      Pt c/o BP issue: STAT if pt c/o blurred vision, one-sided weakness or slurred speech  1. What are your last 5 BP readings? 194/105 this am HR 102. 180/85. 169/85 Monday at the PCP's office  2. Are you having any other symptoms (ex. Dizziness, headache, blurred vision, passed out)? Keeps a headache 3. What is your BP issue  bp is high. Please advise

## 2014-11-21 NOTE — Telephone Encounter (Signed)
Spoke with pt's wife, DPR on file. Wife states that pt went to see PCP on Monday and BP was 169/85. Wife states PCP told pt to take Metoprolol Tartrate '25mg'$  BID. Wife called PCP's office back yesterday and informed them that pt's BP and pulse not improving and was directed to have pt to continue to take medications for 2 week and to call PCP back at that time if sx not improved. Wife states that pt's BP this AM was 194/105 and HR was 103. Pt has constant headaches but denies other sx- no CP, no dizziness and no lightheadedness. Wife states pt has constant SHOB due to Lung Cancer. Informed wife to continue to monitor pt's BP and that I would route this information to Dr. Irish Lack for review and advisement. Wife verbalized understanding and was in agreement with this plan.

## 2014-11-21 NOTE — Telephone Encounter (Signed)
Start amlodipine 5 mg daily for high BP.  COntinue metoprolol as well.

## 2014-11-22 ENCOUNTER — Ambulatory Visit
Admission: RE | Admit: 2014-11-22 | Discharge: 2014-11-22 | Disposition: A | Discharge status: Home / Self Care | Payer: Medicare Other | Source: Ambulatory Visit | Attending: Radiation Oncology | Admitting: Radiation Oncology

## 2014-11-22 DIAGNOSIS — C7931 Secondary malignant neoplasm of brain: Secondary | ICD-10-CM

## 2014-11-22 MED ORDER — GADOBENATE DIMEGLUMINE 529 MG/ML IV SOLN
16.0000 mL | Freq: Once | INTRAVENOUS | Status: AC | PRN
  Administered 2014-11-22: 16 mL via INTRAVENOUS

## 2014-11-25 ENCOUNTER — Encounter: Payer: Self-pay | Admitting: Radiation Oncology

## 2014-11-25 ENCOUNTER — Ambulatory Visit
Admission: RE | Admit: 2014-11-25 | Discharge: 2014-11-25 | Disposition: A | Discharge status: Home / Self Care | Payer: Medicare Other | Source: Ambulatory Visit | Attending: Radiation Oncology | Admitting: Radiation Oncology

## 2014-11-25 ENCOUNTER — Telehealth: Payer: Self-pay | Admitting: Medical Oncology

## 2014-11-25 VITALS — BP 147/68 | HR 76 | Temp 98.4°F | Resp 22 | Ht 59.0 in | Wt 169.9 lb

## 2014-11-25 DIAGNOSIS — C7931 Secondary malignant neoplasm of brain: Secondary | ICD-10-CM

## 2014-11-25 NOTE — Progress Notes (Signed)
Follow up s/p SRS , here for MRI Brain results 11/22/14, c/o throat pain, thick saliva, poor appetite, head aches, right eye blurred vision,on 1.5 n/c oxygen, coughs up mostly white thicj phelgm, fatigued 10:10 AM BP 147/68 mmHg  Pulse 76  Temp(Src) 98.4 F (36.9 C) (Oral)  Resp 22  Ht '4\' 11"'$  (1.499 m)  Wt 169 lb 14.4 oz (77.066 kg)  BMI 34.30 kg/m2  SpO2 96%  Wt Readings from Last 3 Encounters:  11/22/14 173 lb (78.472 kg)  11/19/14 172 lb 9.6 oz (78.291 kg)  10/23/14 169 lb 8 oz (76.885 kg)

## 2014-11-25 NOTE — Telephone Encounter (Signed)
Wife notified of chemo appt wed and susan in xrt notifed.

## 2014-11-25 NOTE — Progress Notes (Signed)
Radiation Oncology         [phone] ________________________________  Name: [name] MRN: [id number]  Date: 11/25/2014  DOB: Mar 03, 1941  Follow-Up Visit Note  CC: [name], MD  [name], MD  Diagnosis:  No diagnosis found.  Interval Since Last Radiation:  09/25/2014  Narrative:  The patient returns today for routine follow-up. The pt is here for MRI Brain results from 11/22/14. The pt c/o a sore throat (for the last few months), thick saliva, poor appetite, headaches, and blurred vision in the right eye. The headaches have been present before he started his radiation treatments. The pt is on 1.5 n/c  oxygen. He is on oxygen 24/7 and have been on it for the past 2 weeks. Coughs up mostly white, thick, phlegm and is fatigued. The pt is undergoing chemo with [name]. The pt states that the O2 is irritating his throat. The pt had cataract surgery on the right eye, but was supposed to have surgery on the left, but found out he had cancer and canceled the surgery.  ALLERGIES:  has No Known Allergies.  Meds: Current Outpatient Prescriptions  Medication Sig Dispense Refill  . albuterol (PROVENTIL) (2.5 MG/3ML) 0.083% nebulizer solution Take 2.5 mg by nebulization every 6 (six) hours as needed for wheezing or shortness of breath.    Marland Kitchen amLODipine (NORVASC) 5 MG tablet Take 1 tablet (5 mg total) by mouth daily. 30 tablet 9  . aspirin EC 81 MG tablet Take 81 mg by mouth daily.     . clonazePAM (KLONOPIN) 0.5 MG tablet Take 0.5 mg by mouth 2 (two) times daily as needed for anxiety. Takes one tablet by mouth two times daily as needed for anxiety.    Marland Kitchen HYDROcodone-acetaminophen (NORCO) 7.5-325 MG per tablet Take 1 tablet by mouth every 6 (six) hours as needed for moderate pain. 30 tablet 0  . LORazepam (ATIVAN) 0.5 MG tablet Take 1 tablet by mouth or sublingually every 8 hours as needed for nausea. May take 1 tablet by mouth at bedtime. 40 tablet 0  . metoprolol tartrate  (LOPRESSOR) 25 MG tablet Take 25 mg by mouth daily.    Marland Kitchen omeprazole (PRILOSEC) 40 MG capsule Take 40 mg by mouth daily.    . pramipexole (MIRAPEX) 1 MG tablet Take 1 mg by mouth at bedtime.    . predniSONE (DELTASONE) 5 MG tablet Label  & dispense according to the schedule below. 10 Pills PO for 3 days then, 8 Pills PO for 3 days, 6 Pills PO for 3 days, 4 Pills PO for 3 days, 2 Pills PO for 3 days, 1 Pills PO for 3 days, 1/2 Pill  PO for 3 days then STOP. Total 95 pills. 95 tablet 0  . traMADol (ULTRAM) 50 MG tablet Take 1 tablet (50 mg total) by mouth every 12 (twelve) hours as needed. 30 tablet 0  . zolpidem (AMBIEN) 10 MG tablet Take 10 mg by mouth at bedtime as needed for sleep.    . colchicine 0.6 MG tablet Take 1 tablet (0.6 mg total) by mouth daily. (Patient not taking: Reported on 11/25/2014) 30 tablet 0  . levofloxacin (LEVAQUIN) 750 MG tablet Take 1 tablet (750 mg total) by mouth daily. (Patient not taking: Reported on 11/25/2014) 7 tablet 0   No current facility-administered medications for this encounter.    Physical Findings: The patient is in no acute distress. Patient is alert and oriented.  height is '4\' 11"'$  (1.499 m) and weight  is 169 lb 14.4 oz (77.066 kg). His oral temperature is 98.4 F (36.9 C). His blood pressure is 147/68 and his pulse is 76. His respiration is 22 and oxygen saturation is 96%.   Lab Findings: Lab Results  Component Value Date   WBC 14.6* 11/19/2014   WBC 15.1* 11/14/2014   HGB 9.4* 11/19/2014   HGB 7.7* 11/14/2014   HCT 28.4* 11/19/2014   HCT 23.0* 11/14/2014   PLT 939* 11/19/2014   PLT 340 11/14/2014    Lab Results  Component Value Date   NA 137 11/19/2014   NA 132* 11/14/2014   K 4.2 11/19/2014   K 3.5 11/14/2014   CHLORIDE 94* 11/19/2014   CO2 31* 11/19/2014   CO2 29 11/14/2014   GLUCOSE 95 11/19/2014   GLUCOSE 114* 11/14/2014   GLUCOSE 117* 12/28/2012   BUN 10.3 11/19/2014   BUN 5* 11/14/2014   CREATININE 0.7 11/19/2014    CREATININE 0.64 11/14/2014   BILITOT <0.20 11/19/2014   BILITOT 0.8 11/11/2014   ALKPHOS 97 11/19/2014   ALKPHOS 128* 11/11/2014   AST 20 11/19/2014   AST 21 11/11/2014   ALT 47 11/19/2014   ALT 27 11/11/2014   PROT 6.3* 11/19/2014   PROT 7.0 11/11/2014   ALBUMIN 2.9* 11/19/2014   ALBUMIN 3.3* 11/11/2014   CALCIUM 9.1 11/19/2014   CALCIUM 8.3* 11/14/2014   ANIONGAP 12* 11/19/2014   ANIONGAP 7 11/14/2014    Radiographic Findings: Dg Chest 2 View  11/13/2014   CLINICAL DATA:  Shortness of breath, COPD, 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  Atherosclerotic calcification aorta.  Postsurgical changes at the RIGHT suprahilar region.  Question LEFT upper lobe infiltrate.  Underlying emphysematous changes.  Tenting and probable scarring at RIGHT lung base unchanged.  No pleural effusion or pneumothorax.  Bones diffusely demineralized.  IMPRESSION: Chronic lung changes and scarring in the RIGHT hemi thorax suspect related to previous surgery.  COPD changes with questionable LEFT upper lobe infiltrate medially.   Electronically Signed   By: Lavonia Dana M.D.   On: 11/13/2014 10:07   Dg Wrist 2 Views Left  11/11/2014   CLINICAL DATA:  Chronic left wrist pain and swelling posteriorly.  EXAM: LEFT WRIST - 2 VIEW  COMPARISON:  02/14/2010  FINDINGS: Osteoarthritic changes present of the carpal bones involving the scaphoid, lunate, and capitate articulation. These carpal bones demonstrate joint space loss, sclerosis and bony spurring. Distal radius and ulna appear intact. Mild dorsal soft tissue swelling on the lateral view. No acute fracture evident.  IMPRESSION: Osteoarthritis of the carpal bones, similar appearance compared to 2011.  No acute osseous finding.   Electronically Signed   By: Jerilynn Mages.  Shick M.D.   On: 11/11/2014 14:36    Mr Jeri Cos OZ Contrast  11/22/2014   CLINICAL DATA:  Lung cancer. SRS treatment of solitary right parietal metastasis 09/25/2014. Two month restaging scan.  EXAM: MRI HEAD WITHOUT AND WITH CONTRAST  TECHNIQUE: Multiplanar, multiecho pulse sequences of the brain and surrounding structures were obtained without and with intravenous contrast.  CONTRAST:  54m MULTIHANCE GADOBENATE DIMEGLUMINE 529 MG/ML IV SOLN  COMPARISON:  09/17/2014  FINDINGS: Images are mildly degraded by motion artifact. There is a 2 mm punctate focus of restricted diffusion in the left centrum semiovale without associated enhancement. There is no evidence of intracranial hemorrhage, midline  shift, or extra-axial fluid collection. There is mild generalized cerebral atrophy. Small foci of T2 hyperintensity in the cerebral white matter and pons are similar to the prior study and nonspecific but compatible with minimal chronic small vessel ischemic disease.  The 2 mm enhancing right parietal cortical lesion is no longer identified (series 10, image 120), however axial (and to a lesser extent sagittal and coronal) postcontrast images are mildly motion degraded which decreases the sensitivity for detection of small lesions. A punctate focus of increased cortical signal in an adjacent right parietal gyrus on axial T1 postcontrast images is felt to be artifactual, not confirmed in other planes. There is a new 3 mm enhancing cortical lesion in the left frontal lobe (series 10, image 98).  Prior right cataract extraction is noted. There is a trace left mastoid effusion, and there is trace right sphenoid sinus fluid. Major intracranial vascular flow voids are preserved.  IMPRESSION: 1. 2 mm right parietal lesion no longer identified. 2. New 3 mm left frontal metastasis. 3. Punctate, acute small vessel infarct in the left centrum semiovale. These results will be called to the ordering clinician or representative by the Radiologist Assistant, and  communication documented in the PACS or zVision Dashboard.   Electronically Signed   By: Logan Bores   On: 11/22/2014 15:31   Dg Abd Acute W/chest  11/11/2014   CLINICAL DATA:  Abdominal pain  EXAM: DG ABDOMEN ACUTE W/ 1V CHEST  COMPARISON:  09/30/2014, 10/07/2014  FINDINGS: Cardiac shadow is stable. Stable scarring is noted within the the lungs bilaterally. Stable nodular changes are again noted as well.  Scattered large and small bowel gas is noted. No free air is seen. No definitive obstructive changes are noted. Degenerative change of the lumbar spine is seen.  IMPRESSION: Chronic changes without acute abnormality.   Electronically Signed   By: Inez Catalina M.D.   On: 11/11/2014 09:32    Impression:  A new 3 mm spot was found on the MRI scan. No other new areas were discovered. Discussed the MRI scan in brain conference. Pt is willing to proceed with radiation treatment.  Plan:  Schedule CT sim. The pt is a good candidate for radiosurgery. The pt's wife signed a consent form and this was filed into the pt's chart.   This document serves as a record of services personally performed by Kyung Rudd, MD. It was created on his behalf by Darcus Austin, a trained medical scribe. The creation of this record is based on the scribe's personal observations and the provider's statements to them. This document has been checked and approved by the attending provider.    ________________________________  Jodelle Gross, M.D., Ph.D.

## 2014-11-27 ENCOUNTER — Ambulatory Visit (HOSPITAL_BASED_OUTPATIENT_CLINIC_OR_DEPARTMENT_OTHER): Payer: Medicare Other

## 2014-11-27 ENCOUNTER — Encounter: Payer: Self-pay | Admitting: Radiation Oncology

## 2014-11-27 ENCOUNTER — Other Ambulatory Visit (HOSPITAL_BASED_OUTPATIENT_CLINIC_OR_DEPARTMENT_OTHER): Payer: Medicare Other

## 2014-11-27 ENCOUNTER — Ambulatory Visit
Admission: RE | Admit: 2014-11-27 | Discharge: 2014-11-27 | Disposition: A | Discharge status: Home / Self Care | Payer: Medicare Other | Source: Ambulatory Visit | Attending: Radiation Oncology | Admitting: Radiation Oncology

## 2014-11-27 VITALS — BP 162/62 | HR 80 | Temp 98.4°F | Resp 20

## 2014-11-27 VITALS — BP 113/70 | HR 60 | Temp 98.6°F | Resp 20 | Wt 193.6 lb

## 2014-11-27 DIAGNOSIS — D6489 Other specified anemias: Secondary | ICD-10-CM

## 2014-11-27 DIAGNOSIS — C7971 Secondary malignant neoplasm of right adrenal gland: Secondary | ICD-10-CM

## 2014-11-27 DIAGNOSIS — Z5111 Encounter for antineoplastic chemotherapy: Secondary | ICD-10-CM

## 2014-11-27 DIAGNOSIS — C3411 Malignant neoplasm of upper lobe, right bronchus or lung: Secondary | ICD-10-CM

## 2014-11-27 DIAGNOSIS — C7931 Secondary malignant neoplasm of brain: Secondary | ICD-10-CM | POA: Diagnosis not present

## 2014-11-27 LAB — CBC WITH DIFFERENTIAL/PLATELET
BASO%: 0.4 % (ref 0.0–2.0)
BASOS ABS: 0 10*3/uL (ref 0.0–0.1)
EOS%: 0.2 % (ref 0.0–7.0)
Eosinophils Absolute: 0 10*3/uL (ref 0.0–0.5)
HCT: 29.1 % — ABNORMAL LOW (ref 38.4–49.9)
HEMOGLOBIN: 9.4 g/dL — AB (ref 13.0–17.1)
LYMPH%: 31.6 % (ref 14.0–49.0)
MCH: 30.6 pg (ref 27.2–33.4)
MCHC: 32.3 g/dL (ref 32.0–36.0)
MCV: 94.8 fL (ref 79.3–98.0)
MONO#: 0.3 10*3/uL (ref 0.1–0.9)
MONO%: 6.6 % (ref 0.0–14.0)
NEUT#: 3.1 10*3/uL (ref 1.5–6.5)
NEUT%: 61.2 % (ref 39.0–75.0)
PLATELETS: 434 10*3/uL — AB (ref 140–400)
RBC: 3.07 10*6/uL — AB (ref 4.20–5.82)
RDW: 18.1 % — ABNORMAL HIGH (ref 11.0–14.6)
WBC: 5 10*3/uL (ref 4.0–10.3)
lymph#: 1.6 10*3/uL (ref 0.9–3.3)

## 2014-11-27 LAB — COMPREHENSIVE METABOLIC PANEL (CC13)
ALBUMIN: 3.3 g/dL — AB (ref 3.5–5.0)
ALT: 38 U/L (ref 0–55)
ANION GAP: 12 meq/L — AB (ref 3–11)
AST: 26 U/L (ref 5–34)
Alkaline Phosphatase: 78 U/L (ref 40–150)
BUN: 12.4 mg/dL (ref 7.0–26.0)
CO2: 31 mEq/L — ABNORMAL HIGH (ref 22–29)
CREATININE: 0.7 mg/dL (ref 0.7–1.3)
Calcium: 9.2 mg/dL (ref 8.4–10.4)
Chloride: 94 mEq/L — ABNORMAL LOW (ref 98–109)
EGFR: >90 mL/min/{1.73_m2} (ref >90)
Glucose: 104 mg/dl (ref 70–140)
Potassium: 3.4 mEq/L — ABNORMAL LOW (ref 3.5–5.1)
SODIUM: 137 meq/L (ref 136–145)
Total Bilirubin: 0.27 mg/dL (ref 0.20–1.20)
Total Protein: 6.5 g/dL (ref 6.4–8.3)

## 2014-11-27 MED ORDER — PROCHLORPERAZINE MALEATE 10 MG PO TABS
ORAL_TABLET | ORAL | Status: AC
  Filled 2014-11-27: qty 1

## 2014-11-27 MED ORDER — PROCHLORPERAZINE MALEATE 10 MG PO TABS
10.0000 mg | ORAL_TABLET | Freq: Once | ORAL | Status: AC
  Administered 2014-11-27: 10 mg via ORAL

## 2014-11-27 MED ORDER — SODIUM CHLORIDE 0.9 % IV SOLN
Freq: Once | INTRAVENOUS | Status: AC
  Administered 2014-11-27: 10:00:00 via INTRAVENOUS

## 2014-11-27 MED ORDER — SODIUM CHLORIDE 0.9 % IV SOLN
1000.0000 mg/m2 | Freq: Once | INTRAVENOUS | Status: AC
  Administered 2014-11-27: 1976 mg via INTRAVENOUS
  Filled 2014-11-27: qty 51.97

## 2014-11-27 NOTE — Progress Notes (Addendum)
Weekly rad tx whole brain, no c/o pain,nausea, blurred vision, does have some mild expressive aphasia , appetite good, energy level fair,no skin changes,not using the cream as yet 11:25 AM BP 113/70 mmHg  Pulse 60  Temp(Src) 98.6 F (37 C) (Oral)  Resp 20  Wt 193 lb 9.6 oz (87.816 kg)  SpO2 100%  Wt Readings from Last 3 Encounters:  11/27/14 193 lb 9.6 oz (87.816 kg)  11/22/14 173 lb (78.472 kg)  11/19/14 172 lb 9.6 oz (78.291 kg)

## 2014-11-27 NOTE — Patient Instructions (Signed)
[facility] Discharge Instructions for Patients Receiving Chemotherapy  Today you received the following chemotherapy agents Gemzar  To help prevent nausea and vomiting after your treatment, we encourage you to take your nausea medication Ativan 0.'5mg'$  every 8 hours as needed for nausea  If you develop nausea and vomiting that is not controlled by your nausea medication, call the clinic.   BELOW ARE SYMPTOMS THAT SHOULD BE REPORTED IMMEDIATELY:  *FEVER GREATER THAN 100.5 F  *CHILLS WITH OR WITHOUT FEVER  NAUSEA AND VOMITING THAT IS NOT CONTROLLED WITH YOUR NAUSEA MEDICATION  *UNUSUAL SHORTNESS OF BREATH  *UNUSUAL BRUISING OR BLEEDING  TENDERNESS IN MOUTH AND THROAT WITH OR WITHOUT PRESENCE OF ULCERS  *URINARY PROBLEMS  *BOWEL PROBLEMS  UNUSUAL RASH Items with * indicate a potential emergency and should be followed up as soon as possible.  Feel free to call the clinic you have any questions or concerns. The clinic phone number is [phone].  Please show the [facility] at check-in to the Emergency Department and triage nurse.

## 2014-12-02 DIAGNOSIS — C3411 Malignant neoplasm of upper lobe, right bronchus or lung: Secondary | ICD-10-CM | POA: Diagnosis not present

## 2014-12-04 ENCOUNTER — Other Ambulatory Visit: Payer: Medicare Other

## 2014-12-04 ENCOUNTER — Ambulatory Visit
Admission: RE | Admit: 2014-12-04 | Discharge: 2014-12-04 | Disposition: A | Discharge status: Home / Self Care | Payer: Medicare Other | Source: Ambulatory Visit | Attending: Radiation Oncology | Admitting: Radiation Oncology

## 2014-12-04 ENCOUNTER — Ambulatory Visit: Payer: Medicare Other

## 2014-12-04 ENCOUNTER — Other Ambulatory Visit: Payer: Self-pay | Admitting: *Deleted

## 2014-12-04 ENCOUNTER — Ambulatory Visit (HOSPITAL_COMMUNITY)
Admission: RE | Admit: 2014-12-04 | Discharge: 2014-12-04 | Disposition: A | Discharge status: Home / Self Care | Payer: Medicare Other | Source: Ambulatory Visit | Attending: Internal Medicine | Admitting: Internal Medicine

## 2014-12-04 ENCOUNTER — Other Ambulatory Visit (HOSPITAL_BASED_OUTPATIENT_CLINIC_OR_DEPARTMENT_OTHER): Payer: Medicare Other

## 2014-12-04 ENCOUNTER — Encounter: Payer: Medicare Other | Admitting: Nutrition

## 2014-12-04 ENCOUNTER — Telehealth: Payer: Self-pay | Admitting: *Deleted

## 2014-12-04 ENCOUNTER — Encounter: Payer: Self-pay | Admitting: Radiation Oncology

## 2014-12-04 VITALS — BP 130/62 | HR 86 | Temp 98.2°F | Resp 20

## 2014-12-04 DIAGNOSIS — C7971 Secondary malignant neoplasm of right adrenal gland: Secondary | ICD-10-CM | POA: Diagnosis not present

## 2014-12-04 DIAGNOSIS — C3410 Malignant neoplasm of upper lobe, unspecified bronchus or lung: Secondary | ICD-10-CM

## 2014-12-04 DIAGNOSIS — C3411 Malignant neoplasm of upper lobe, right bronchus or lung: Secondary | ICD-10-CM | POA: Diagnosis present

## 2014-12-04 DIAGNOSIS — D6489 Other specified anemias: Secondary | ICD-10-CM

## 2014-12-04 DIAGNOSIS — C7931 Secondary malignant neoplasm of brain: Secondary | ICD-10-CM

## 2014-12-04 LAB — CBC WITH DIFFERENTIAL/PLATELET
BASO%: 0.3 % (ref 0.0–2.0)
Basophils Absolute: 0 10*3/uL (ref 0.0–0.1)
EOS%: 0.3 % (ref 0.0–7.0)
Eosinophils Absolute: 0 10*3/uL (ref 0.0–0.5)
HEMATOCRIT: 22.8 % — AB (ref 38.4–49.9)
HGB: 7.4 g/dL — ABNORMAL LOW (ref 13.0–17.1)
LYMPH%: 35.5 % (ref 14.0–49.0)
MCH: 30.6 pg (ref 27.2–33.4)
MCHC: 32.5 g/dL (ref 32.0–36.0)
MCV: 94.2 fL (ref 79.3–98.0)
MONO#: 0.1 10*3/uL (ref 0.1–0.9)
MONO%: 2.8 % (ref 0.0–14.0)
NEUT%: 61.1 % (ref 39.0–75.0)
NEUTROS ABS: 1.8 10*3/uL (ref 1.5–6.5)
Platelets: 62 10*3/uL — ABNORMAL LOW (ref 140–400)
RBC: 2.42 10*6/uL — ABNORMAL LOW (ref 4.20–5.82)
RDW: 17.2 % — ABNORMAL HIGH (ref 11.0–14.6)
WBC: 2.9 10*3/uL — AB (ref 4.0–10.3)
lymph#: 1 10*3/uL (ref 0.9–3.3)
nRBC: 0 % (ref 0–0)

## 2014-12-04 LAB — COMPREHENSIVE METABOLIC PANEL (CC13)
ALT: 31 U/L (ref 0–55)
ANION GAP: 11 meq/L (ref 3–11)
AST: 22 U/L (ref 5–34)
Albumin: 3.2 g/dL — ABNORMAL LOW (ref 3.5–5.0)
Alkaline Phosphatase: 71 U/L (ref 40–150)
BUN: 10.8 mg/dL (ref 7.0–26.0)
CO2: 31 mEq/L — ABNORMAL HIGH (ref 22–29)
CREATININE: 0.7 mg/dL (ref 0.7–1.3)
Calcium: 8.6 mg/dL (ref 8.4–10.4)
Chloride: 95 mEq/L — ABNORMAL LOW (ref 98–109)
EGFR: >90 mL/min/{1.73_m2} (ref >90)
Glucose: 100 mg/dl (ref 70–140)
Potassium: 3.5 mEq/L (ref 3.5–5.1)
Sodium: 137 mEq/L (ref 136–145)
Total Bilirubin: 0.33 mg/dL (ref 0.20–1.20)
Total Protein: 6.2 g/dL — ABNORMAL LOW (ref 6.4–8.3)

## 2014-12-04 MED ORDER — ACETAMINOPHEN 325 MG PO TABS: 650.0000 mg | ORAL_TABLET | Freq: Once | ORAL | Status: DC

## 2014-12-04 NOTE — Progress Notes (Signed)
Patient was told no driving today or for now, patient and wife verbal understanding, teach back given 1:00 PM

## 2014-12-04 NOTE — Progress Notes (Signed)
Quick Note:  Call patient with the result and arrange for 2 units of PRBCs transfusion. ______ 

## 2014-12-04 NOTE — Telephone Encounter (Signed)
Pt will have 1 unit on Thursday 5/26, 1 unit 5/27 POF to scheduling, Har and orders entered.

## 2014-12-04 NOTE — Progress Notes (Signed)
  Radiation Oncology         [phone] ________________________________  Name: [name] MRN: [id number]  Date: 12/04/2014  DOB: [birth year]  End of Treatment Note  Diagnosis:   Metastatic lung cancer     Indication for treatment:  palliative       Radiation treatment dates:   12/04/14  Site/dose:    PTV2 Lt frontal 83m target was treated using 2 Arcs to a prescription dose of 20 Gy. ExacTrac Snap verification was performed for each couch angle.   Narrative: The patient tolerated radiation treatment well.   There were no signs of acute toxicity after treatment.  Plan: The patient has completed radiation treatment. The patient will return to radiation oncology clinic for routine followup in one month. I advised the patient to call or return sooner if they have any questions or concerns related to their recovery or treatment. ________________________________  ------------------------------------------------  JJodelle Gross MD, PhD

## 2014-12-04 NOTE — Telephone Encounter (Signed)
-----   Message from Curt Bears, MD sent at 12/04/2014 12:52 PM EDT ----- Call patient with the result and arrange for 2 units of PRBCs transfusion.

## 2014-12-04 NOTE — Progress Notes (Signed)
  Radiation Oncology         [phone] ________________________________  Name: [name] MRN: [id number]  Date: 12/04/2014  DOB: [birth year]   SPECIAL TREATMENT PROCEDURE   3D TREATMENT PLANNING AND DOSIMETRY: [name] radiation plan was reviewed and approved by Dr. Saintclair Halsted from neurosurgery and radiation oncology prior to treatment. It showed 3-dimensional radiation distributions overlaid onto the planning CT/MRI image set. The [facility] for the target structures as well as the organs at risk were reviewed. The documentation of the 3D plan and dosimetry are filed in the radiation oncology EMR.   NARRATIVE: The patient was brought to the TrueBeam stereotactic radiation treatment machine and placed supine on the CT couch. The head frame was applied, and the patient was set up for stereotactic radiosurgery. Neurosurgery was present for the set-up and delivery   SIMULATION VERIFICATION: In the couch zero-angle position, the patient underwent Exactrac imaging using the Brainlab system with orthogonal KV images. These were carefully aligned and repeated to confirm treatment position for each of the isocenters. The Exactrac snap film verification was repeated at each couch angle.   SPECIAL TREATMENT PROCEDURE: The patient received stereotactic radiosurgery to the following target:  PTV2 Lt frontal 60m target was treated using 2 Arcs to a prescription dose of 20 Gy. ExacTrac Snap verification was performed for each couch angle.   STEREOTACTIC TREATMENT MANAGEMENT: Following delivery, the patient was transported to nursing in stable condition and monitored for possible acute effects. Vital signs were recorded . The patient tolerated treatment without significant acute effects, and was discharged to home in stable condition.  PLAN: Follow-up in one month.   ------------------------------------------------  JJodelle Gross MD, PhD

## 2014-12-04 NOTE — Procedures (Signed)
  Name: [name]  MRN: [id number]  Date: 12/04/2014   DOB: 1941-02-20  Stereotactic Radiosurgery Operative Note  PRE-OPERATIVE DIAGNOSIS:  Solitary Brain Metastasis  POST-OPERATIVE DIAGNOSIS:  Solitary Brain Metastasis  PROCEDURE:  Stereotactic Radiosurgery  SURGEON:  Christina Gintz P, MD  NARRATIVE: The patient underwent a radiation treatment planning session in the radiation oncology simulation suite under the care of the radiation oncology physician and physicist.  I participated closely in the radiation treatment planning afterwards. The patient underwent planning CT which was fused to 3T high resolution MRI with 1 mm axial slices.  These images were fused on the planning system.  We contoured the gross target volumes and subsequently expanded this to yield the Planning Target Volume. I actively participated in the planning process.  I helped to define and review the target contours and also the contours of the optic pathway, eyes, brainstem and selected nearby organs at risk.  All the dose constraints for critical structures were reviewed and compared to AAPM Task Group 101.  The prescription dose conformity was reviewed.  I approved the plan electronically.    Accordingly, [name] was brought to the TrueBeam stereotactic radiation treatment linac and placed in the custom immobilization mask.  The patient was aligned according to the IR fiducial markers with BrainLab Exactrac, then orthogonal x-rays were used in ExacTrac with the 6DOF robotic table and the shifts were made to align the patient  [name] received stereotactic radiosurgery uneventfully.    The detailed description of the procedure is recorded in the radiation oncology procedure note.  I was present for the duration of the procedure.  DISPOSITION:  Following delivery, the patient was transported to nursing in stable condition and monitored for possible acute effects to be discharged to home in stable condition  with follow-up in one month.  Aleta Manternach P, MD 12/04/2014 2:41 PM

## 2014-12-04 NOTE — Progress Notes (Signed)
Patient in room 1 at nursing s/p SRS Brain, on 2 liters/n/c, no c/o nausea, pain,dizzy ness or vision cahnges, will monitor for 30 minutes, MD to se patient afterwards, patient asking if he can drive home today, will need our Oxygen tank to go upstairs till patient gets in his car,his portable oxygen tank here has run out 12:57 PM

## 2014-12-04 NOTE — Progress Notes (Addendum)
  Radiation Oncology         (336) [phone] ________________________________  Name: [name] MRN: [id number]  Date: 11/27/2014  DOB: [birth year]  SIMULATION AND TREATMENT PLANNING NOTE  DIAGNOSIS:  Metastatic lung cancer  NARRATIVE:  The patient was brought to the [facility] suite.  Identity was confirmed.  All relevant records and images related to the planned course of therapy were reviewed.  The patient freely provided informed written consent to proceed with treatment after reviewing the details related to the planned course of therapy. The consent form was witnessed and verified by the simulation staff. Intravenous access was established for contrast administration. Then, the patient was set-up in a stable reproducible supine position for radiation therapy.  A relocatable thermoplastic stereotactic head frame was fabricated for precise immobilization.  CT images were obtained.  Surface markings were placed.  The CT images were loaded into the planning software and fused with the patient's targeting MRI scan.  Then the target and avoidance structures were contoured.  Treatment planning then occurred.  The radiation prescription was entered and confirmed.  I have requested 3D planning  I have requested a DVH of the following structures: Brain stem, brain, left eye, right I, lenses, optic chiasm, target volumes, uninvolved brain, and normal tissue.    PLAN:  The patient will receive 20 Gy in 1 fraction.   Special treatment procedure The patient will be treated with a course of radiosurgery. This requires extremely precise localization of the target and sophisticated, labor intensive treatment planning to achieve a highly focused radiation treatment plan. Due to the extra work involved in planning and delivery of such a procedure, this corresponds to a special treatment procedure.  ________________________________  Jodelle Gross, MD, PhD

## 2014-12-05 ENCOUNTER — Emergency Department (HOSPITAL_COMMUNITY): Payer: Medicare Other

## 2014-12-05 ENCOUNTER — Other Ambulatory Visit: Payer: Medicare Other

## 2014-12-05 ENCOUNTER — Encounter: Payer: Self-pay | Admitting: Radiation Therapy

## 2014-12-05 ENCOUNTER — Emergency Department (HOSPITAL_COMMUNITY)
Admission: EM | Admit: 2014-12-05 | Discharge: 2014-12-05 | Disposition: A | Discharge status: Home / Self Care | Payer: Medicare Other | Attending: Emergency Medicine | Admitting: Emergency Medicine

## 2014-12-05 ENCOUNTER — Encounter (HOSPITAL_COMMUNITY): Payer: Self-pay | Admitting: Emergency Medicine

## 2014-12-05 ENCOUNTER — Ambulatory Visit (HOSPITAL_BASED_OUTPATIENT_CLINIC_OR_DEPARTMENT_OTHER): Payer: Medicare Other

## 2014-12-05 VITALS — BP 134/68 | HR 97 | Temp 98.8°F | Resp 20

## 2014-12-05 DIAGNOSIS — D649 Anemia, unspecified: Secondary | ICD-10-CM | POA: Diagnosis present

## 2014-12-05 DIAGNOSIS — F419 Anxiety disorder, unspecified: Secondary | ICD-10-CM | POA: Insufficient documentation

## 2014-12-05 DIAGNOSIS — Z79899 Other long term (current) drug therapy: Secondary | ICD-10-CM | POA: Diagnosis not present

## 2014-12-05 DIAGNOSIS — R040 Epistaxis: Secondary | ICD-10-CM

## 2014-12-05 DIAGNOSIS — Z85828 Personal history of other malignant neoplasm of skin: Secondary | ICD-10-CM | POA: Diagnosis not present

## 2014-12-05 DIAGNOSIS — D6489 Other specified anemias: Secondary | ICD-10-CM | POA: Diagnosis not present

## 2014-12-05 DIAGNOSIS — Z87891 Personal history of nicotine dependence: Secondary | ICD-10-CM | POA: Insufficient documentation

## 2014-12-05 DIAGNOSIS — Z7982 Long term (current) use of aspirin: Secondary | ICD-10-CM | POA: Insufficient documentation

## 2014-12-05 DIAGNOSIS — Z8701 Personal history of pneumonia (recurrent): Secondary | ICD-10-CM | POA: Insufficient documentation

## 2014-12-05 DIAGNOSIS — M1990 Unspecified osteoarthritis, unspecified site: Secondary | ICD-10-CM | POA: Diagnosis not present

## 2014-12-05 DIAGNOSIS — J441 Chronic obstructive pulmonary disease with (acute) exacerbation: Secondary | ICD-10-CM | POA: Diagnosis not present

## 2014-12-05 DIAGNOSIS — I10 Essential (primary) hypertension: Secondary | ICD-10-CM | POA: Diagnosis not present

## 2014-12-05 DIAGNOSIS — C3410 Malignant neoplasm of upper lobe, unspecified bronchus or lung: Secondary | ICD-10-CM

## 2014-12-05 DIAGNOSIS — R042 Hemoptysis: Secondary | ICD-10-CM | POA: Diagnosis present

## 2014-12-05 DIAGNOSIS — Z8669 Personal history of other diseases of the nervous system and sense organs: Secondary | ICD-10-CM | POA: Diagnosis not present

## 2014-12-05 DIAGNOSIS — K219 Gastro-esophageal reflux disease without esophagitis: Secondary | ICD-10-CM | POA: Insufficient documentation

## 2014-12-05 DIAGNOSIS — Z9981 Dependence on supplemental oxygen: Secondary | ICD-10-CM | POA: Diagnosis not present

## 2014-12-05 LAB — COMPREHENSIVE METABOLIC PANEL
ALBUMIN: 3.5 g/dL (ref 3.5–5.0)
ALT: 30 U/L (ref 17–63)
AST: 24 U/L (ref 15–41)
Alkaline Phosphatase: 71 U/L (ref 38–126)
Anion gap: 11 (ref 5–15)
BUN: 9 mg/dL (ref 6–20)
CHLORIDE: 95 mmol/L — AB (ref 101–111)
CO2: 32 mmol/L (ref 22–32)
Calcium: 9.2 mg/dL (ref 8.9–10.3)
Creatinine, Ser: 0.52 mg/dL — ABNORMAL LOW (ref 0.61–1.24)
GFR calc Af Amer: >60 mL/min (ref >60)
GFR calc non Af Amer: >60 mL/min (ref >60)
Glucose, Bld: 97 mg/dL (ref 65–99)
Potassium: 3.4 mmol/L — ABNORMAL LOW (ref 3.5–5.1)
Sodium: 138 mmol/L (ref 135–145)
Total Bilirubin: 0.2 mg/dL — ABNORMAL LOW (ref 0.3–1.2)
Total Protein: 6.6 g/dL (ref 6.5–8.1)

## 2014-12-05 LAB — CBC WITH DIFFERENTIAL/PLATELET
BASOS ABS: 0 10*3/uL (ref 0.0–0.1)
Basophils Relative: 0 % (ref 0–1)
EOS ABS: 0 10*3/uL (ref 0.0–0.7)
Eosinophils Relative: 1 % (ref 0–5)
HCT: 23.4 % — ABNORMAL LOW (ref 39.0–52.0)
Hemoglobin: 7.6 g/dL — ABNORMAL LOW (ref 13.0–17.0)
LYMPHS ABS: 1 10*3/uL (ref 0.7–4.0)
Lymphocytes Relative: 44 % (ref 12–46)
MCH: 30.9 pg (ref 26.0–34.0)
MCHC: 32.5 g/dL (ref 30.0–36.0)
MCV: 95.1 fL (ref 78.0–100.0)
MONO ABS: 0.2 10*3/uL (ref 0.1–1.0)
Monocytes Relative: 7 % (ref 3–12)
Neutro Abs: 1.1 10*3/uL — ABNORMAL LOW (ref 1.7–7.7)
Neutrophils Relative %: 48 % (ref 43–77)
Platelets: 54 10*3/uL — ABNORMAL LOW (ref 150–400)
RBC: 2.46 MIL/uL — AB (ref 4.22–5.81)
RDW: 17.1 % — ABNORMAL HIGH (ref 11.5–15.5)
WBC: 2.3 10*3/uL — AB (ref 4.0–10.5)

## 2014-12-05 LAB — PREPARE RBC (CROSSMATCH)

## 2014-12-05 MED ORDER — ALBUTEROL SULFATE (2.5 MG/3ML) 0.083% IN NEBU
5.0000 mg | INHALATION_SOLUTION | Freq: Once | RESPIRATORY_TRACT | Status: AC
Start: 2014-12-05 — End: 2014-12-05
  Administered 2014-12-05: 5 mg via RESPIRATORY_TRACT
  Filled 2014-12-05: qty 6

## 2014-12-05 MED ORDER — ACETAMINOPHEN 325 MG PO TABS
ORAL_TABLET | ORAL | Status: AC
  Filled 2014-12-05: qty 2

## 2014-12-05 MED ORDER — SODIUM CHLORIDE 0.9 % IV SOLN
250.0000 mL | Freq: Once | INTRAVENOUS | Status: AC
  Administered 2014-12-05: 250 mL via INTRAVENOUS

## 2014-12-05 MED ORDER — DIPHENHYDRAMINE HCL 25 MG PO CAPS
ORAL_CAPSULE | ORAL | Status: AC
  Filled 2014-12-05: qty 1

## 2014-12-05 MED ORDER — DIPHENHYDRAMINE HCL 25 MG PO CAPS
25.0000 mg | ORAL_CAPSULE | Freq: Once | ORAL | Status: AC
  Administered 2014-12-05: 25 mg via ORAL

## 2014-12-05 MED ORDER — ACETAMINOPHEN 325 MG PO TABS
650.0000 mg | ORAL_TABLET | Freq: Once | ORAL | Status: AC
  Administered 2014-12-05: 650 mg via ORAL

## 2014-12-05 NOTE — ED Notes (Signed)
Pt states he has cancer all throughout his thoracic cavity, both lungs, and brain cancer  Pt received radiation on his head yesterday  Pt states tonight since about 2am he has had a productive cough with bloody sputum  Pt wears oxygen at 2 to 3 liters at home  Pt is scheduled for a CT scan today to see if the treatments are working and is supposed to get blood today and tomorrow

## 2014-12-05 NOTE — ED Provider Notes (Signed)
CSN: [id number]     Arrival date & time 12/05/14  0430 History   First MD Initiated Contact with Patient 12/05/14 0454     Chief Complaint  Patient presents with  . Hemoptysis     (Consider location/radiation/quality/duration/timing/severity/associated sxs/prior Treatment) HPI Comments: The patient presents with complaint of coughing up blood. He has a history of lung cancer, currently being treated with chemotherapy. 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 He states that yesterday he saw a small amount of blood in his sputum and last night around 2:00 am "it was gushing". No chest pain or worse SOB over his baseline. No fever, nausea or vomiting.   On further questioning, the patient reports bleeding was coming from right nostril and resolved with pressure to the area. No further bleeding since initial episode. No coughing with production of bloody mucus since that time either.   The history is provided by the patient and the spouse. No language interpreter was used.    Past Medical History  Diagnosis Date  . Arthritis   . Wheezing   . Sore throat   . Carotid artery occlusion   . COPD (chronic obstructive pulmonary disease)   . Lumbar disc disease   . Restless leg syndrome   . Allergic rhinitis   . Hypertension   . Anxiety     anxiety  . Shortness of breath   . Lung mass   . GERD (gastroesophageal reflux disease)   . H/O hiatal hernia   . Pre-operative cardiovascular examination   . Nonspecific abnormal unspecified cardiovascular function study   . Peripheral vascular disease, unspecified   . Pneumonia 2014    ?   Marland Kitchen On home oxygen therapy     prn  . Constipation   . Cancer 09/14/12    invasive mod diff squamous cell ca  . S/P radiation therapy 09/25/14    brain mets 20Gy   Past Surgical History  Procedure Laterality Date  . Carotid endarterectomy  10/15/2010    left  . Hand surgery      repair of the left long, ring, and small  finger after a saw injury  . Video bronchoscopy  07/24/2012    Procedure: VIDEO BRONCHOSCOPY WITH FLUORO;  Surgeon: Kathee Delton, MD;  Location: [facility];  Service: Cardiopulmonary;  Laterality: Bilateral;  . Lobectomy Right 09/14/2012    Procedure: LOBECTOMY;  Surgeon: Ivin Poot, MD;  Location: [facility];  Service: Thoracic;  Laterality: Right;  RIGHT UPPER LOBECTOMY  . Video assisted thoracoscopy Right 09/14/2012    Procedure: VIDEO ASSISTED THORACOSCOPY;  Surgeon: Ivin Poot, MD;  Location: [facility];  Service: Thoracic;  Laterality: Right;  . Video bronchoscopy with endobronchial ultrasound N/A 08/27/2014    Procedure: VIDEO BRONCHOSCOPY WITH ENDOBRONCHIAL ULTRASOUND;  Surgeon: Ivin Poot, MD;  Location: [facility];  Service: Thoracic;  Laterality: N/A;  . Scalene node biopsy Right 08/27/2014    Procedure: BIOPSY SCALENE NODE;  Surgeon: Ivin Poot, MD;  Location: [facility] OR;  Service: Thoracic;  Laterality: Right;  . Sterotactic radiosurgery Right 09/25/14    right parietal 30Gy/1 fx   Family History  Problem Relation Age of Onset  . Heart disease Father     MI  . Heart attack Father   . Alcohol abuse Mother   . Stroke Brother   . Heart disease Brother   . Depression Sister   . Heart attack Brother    History  Substance Use Topics  . Smoking status: Former Smoker -- 1.00 packs/day for 55 years    Types: Cigarettes    Quit date: 08/16/2014  . Smokeless tobacco: Former Systems developer    Quit date: 04/19/1952  . Alcohol Use: Yes     Comment: occianional- not since I ve sick.    Review of Systems  Constitutional: Negative for fever and chills.  HENT: Positive for nosebleeds.   Respiratory: Positive for cough and shortness of breath.        Baseline SOB with h/o Lung CA.  Cardiovascular: Negative.  Negative for chest pain.  Gastrointestinal: Negative.  Negative for vomiting and abdominal pain.  Musculoskeletal: Negative.   Skin: Negative.   Neurological: Negative.        Allergies  Review of patient's allergies indicates no known allergies.  Home Medications   Prior to Admission medications   Medication Sig Start Date End Date Taking? Authorizing Provider  albuterol (PROVENTIL) (2.5 MG/3ML) 0.083% nebulizer solution Take 2.5 mg by nebulization every 6 (six) hours as needed for wheezing or shortness of breath.   Yes Historical Provider, MD  amLODipine (NORVASC) 5 MG tablet Take 1 tablet (5 mg total) by mouth daily. 11/21/14  Yes Jettie Booze, MD  aspirin EC 81 MG tablet Take 81 mg by mouth daily.    Yes Historical Provider, MD  clonazePAM (KLONOPIN) 0.5 MG tablet Take 0.5 mg by mouth 2 (two) times daily as needed for anxiety. Takes one tablet by mouth two times daily as needed for anxiety.   Yes Historical Provider, MD  HYDROcodone-acetaminophen (NORCO) 7.5-325 MG per tablet Take 1 tablet by mouth every 6 (six) hours as needed for moderate pain. 10/23/14  Yes Curt Bears, MD  LORazepam (ATIVAN) 0.5 MG tablet Take 1 tablet by mouth or sublingually every 8 hours as needed for nausea. May take 1 tablet by mouth at bedtime. 10/02/14  Yes Adrena E Johnson, PA-C  metoprolol tartrate (LOPRESSOR) 25 MG tablet Take 25 mg by mouth daily. 10/22/14  Yes Historical Provider, MD  omeprazole (PRILOSEC) 40 MG capsule Take 40 mg by mouth daily.   Yes Historical Provider, MD  pramipexole (MIRAPEX) 1 MG tablet Take 1 mg by mouth at bedtime.   Yes Historical Provider, MD  traMADol (ULTRAM) 50 MG tablet Take 1 tablet (50 mg total) by mouth every 12 (twelve) hours as needed. 10/23/14  Yes Curt Bears, MD  zolpidem (AMBIEN) 10 MG tablet Take 10 mg by mouth at bedtime as needed for sleep.   Yes Historical Provider, MD  colchicine 0.6 MG tablet Take 1 tablet (0.6 mg total) by mouth daily. Patient not taking: Reported on 12/05/2014 11/14/14   Thurnell Lose, MD  levofloxacin (LEVAQUIN) 750 MG tablet Take 1 tablet (750 mg total) by mouth daily. Patient not taking:  Reported on 11/27/2014 11/14/14   Thurnell Lose, MD  predniSONE (DELTASONE) 5 MG tablet Label  & dispense according to the schedule below. 10 Pills PO for 3 days then, 8 Pills PO for 3 days, 6 Pills PO for 3 days, 4 Pills PO for 3 days, 2 Pills PO for 3 days, 1 Pills PO for 3 days, 1/2 Pill  PO for 3 days then STOP. Total 95 pills. Patient not taking: Reported on 12/05/2014 11/14/14   Thurnell Lose, MD   BP 170/89 mmHg  Pulse 107  Temp(Src) 98.1 F (36.7 C) (Oral)  Resp 26  SpO2 98% Physical Exam  Constitutional: He is oriented to person,  place, and time. He appears well-developed and well-nourished.  Cachectic appearing male in mild respiratory distress.   HENT:  Site of previous epistaxis to right anterior septum.   Eyes: Conjunctivae are normal.  No conjunctival pallor.  Neck: Normal range of motion. Neck supple.  Cardiovascular: Normal rate and regular rhythm.   No murmur heard. Pulmonary/Chest:  Prolonged expiratory phase. Decreased air movement throughout lung fields with mild wheezes. Scattered rales.   Abdominal: Soft. There is no tenderness.  Musculoskeletal: Normal range of motion.  Neurological: He is alert and oriented to person, place, and time.  Skin: Skin is warm and dry. He is not diaphoretic.  Psychiatric: He has a normal mood and affect.    ED Course  Procedures (including critical care time) Labs Review Labs Reviewed  CBC WITH DIFFERENTIAL/PLATELET  COMPREHENSIVE METABOLIC PANEL   Results for orders placed or performed during the hospital encounter of 12/05/14  CBC with Differential  Result Value Ref Range   WBC 2.3 (L) 4.0 - 10.5 K/uL   RBC 2.46 (L) 4.22 - 5.81 MIL/uL   Hemoglobin 7.6 (L) 13.0 - 17.0 g/dL   HCT 23.4 (L) 39.0 - 52.0 %   MCV 95.1 78.0 - 100.0 fL   MCH 30.9 26.0 - 34.0 pg   MCHC 32.5 30.0 - 36.0 g/dL   RDW 17.1 (H) 11.5 - 15.5 %   Platelets 54 (L) 150 - 400 K/uL   Neutrophils Relative % 48 43 - 77 %   Neutro Abs 1.1 (L) 1.7 - 7.7 K/uL    Lymphocytes Relative 44 12 - 46 %   Lymphs Abs 1.0 0.7 - 4.0 K/uL   Monocytes Relative 7 3 - 12 %   Monocytes Absolute 0.2 0.1 - 1.0 K/uL   Eosinophils Relative 1 0 - 5 %   Eosinophils Absolute 0.0 0.0 - 0.7 K/uL   Basophils Relative 0 0 - 1 %   Basophils Absolute 0.0 0.0 - 0.1 K/uL  Comprehensive metabolic panel  Result Value Ref Range   Sodium 138 135 - 145 mmol/L   Potassium 3.4 (L) 3.5 - 5.1 mmol/L   Chloride 95 (L) 101 - 111 mmol/L   CO2 32 22 - 32 mmol/L   Glucose, Bld 97 65 - 99 mg/dL   BUN 9 6 - 20 mg/dL   Creatinine, Ser 0.52 (L) 0.61 - 1.24 mg/dL   Calcium 9.2 8.9 - 10.3 mg/dL   Total Protein 6.6 6.5 - 8.1 g/dL   Albumin 3.5 3.5 - 5.0 g/dL   AST 24 15 - 41 U/L   ALT 30 17 - 63 U/L   Alkaline Phosphatase 71 38 - 126 U/L   Total Bilirubin 0.2 (L) 0.3 - 1.2 mg/dL   GFR calc non Af Amer >60 >60 mL/min   GFR calc Af Amer >60 >60 mL/min   Anion gap 11 5 - 15    Imaging Review No results found.   EKG Interpretation None      MDM   Final diagnoses:  None    1. Epistaxis, resolved 2. Anemia 3. H/o cancer with metastatic disease  The patient is breathing easier after nebulizer treatment.   VSS. Discussed the patient with Dr. Earlie Server who feels he can be discharged home and should keep the appointments today in the cancer center for planned transfusion and repeat CT's to evaluate disease progression. The patient and family are agreeable to care plan.    Charlann Lange, PA-C 12/05/14 2014  Varney Biles, MD 12/07/14  0541 

## 2014-12-05 NOTE — Discharge Instructions (Signed)
Nosebleed °A nosebleed can be caused by many things, including: °· Getting hit hard in the nose. °· Infections. °· Dry nose. °· Colds. °· Medicines. °Your doctor may do lab testing if you get nosebleeds a lot and the cause is not known. °HOME CARE  °· If your nose was packed with material, keep it there until your doctor takes it out. Put the pack back in your nose if the pack falls out. °· Do not blow your nose for 12 hours after the nosebleed. °· Sit up and bend forward if your nose starts bleeding again. Pinch the front half of your nose nonstop for 20 minutes. °· Put petroleum jelly inside your nose every morning if you have a dry nose. °· Use a humidifier to make the air less dry. °· Do not take aspirin. °· Try not to strain, lift, or bend at the waist for many days after the nosebleed. °GET HELP RIGHT AWAY IF:  °· Nosebleeds keep happening and are hard to stop or control. °· You have bleeding or bruises that are not normal on other parts of the body. °· You have a fever. °· The nosebleeds get worse. °· You get lightheaded, feel faint, sweaty, or throw up (vomit) blood. °MAKE SURE YOU:  °· Understand these instructions. °· Will watch your condition. °· Will get help right away if you are not doing well or get worse. °Document Released: 04/06/2008 Document Revised: 09/20/2011 Document Reviewed: 04/06/2008 °[employer]® Patient Information ©2015 [employer]. This information is not intended to replace advice given to you by your health care provider. Make sure you discuss any questions you have with your health care provider. ° °

## 2014-12-05 NOTE — Progress Notes (Signed)
[name] came to the ED very early this morning because he started coughing up large amounts of blood around 2am this morning.    [name]

## 2014-12-05 NOTE — Patient Instructions (Signed)

## 2014-12-05 NOTE — ED Notes (Signed)
Two unsuccessful IV attempts by this Probation officer.

## 2014-12-06 ENCOUNTER — Ambulatory Visit (HOSPITAL_COMMUNITY)
Admission: RE | Admit: 2014-12-06 | Discharge: 2014-12-06 | Disposition: A | Discharge status: Home / Self Care | Payer: Medicare Other | Source: Ambulatory Visit | Attending: Physician Assistant | Admitting: Physician Assistant

## 2014-12-06 ENCOUNTER — Ambulatory Visit (HOSPITAL_BASED_OUTPATIENT_CLINIC_OR_DEPARTMENT_OTHER): Payer: Medicare Other

## 2014-12-06 ENCOUNTER — Ambulatory Visit (HOSPITAL_COMMUNITY): Admission: RE | Admit: 2014-12-06 | Payer: Medicare Other | Source: Ambulatory Visit

## 2014-12-06 VITALS — BP 128/78 | HR 73 | Temp 98.4°F | Resp 18

## 2014-12-06 DIAGNOSIS — D649 Anemia, unspecified: Secondary | ICD-10-CM | POA: Diagnosis present

## 2014-12-06 DIAGNOSIS — D6489 Other specified anemias: Secondary | ICD-10-CM | POA: Diagnosis not present

## 2014-12-06 DIAGNOSIS — C7931 Secondary malignant neoplasm of brain: Secondary | ICD-10-CM | POA: Insufficient documentation

## 2014-12-06 DIAGNOSIS — C3490 Malignant neoplasm of unspecified part of unspecified bronchus or lung: Secondary | ICD-10-CM | POA: Insufficient documentation

## 2014-12-06 DIAGNOSIS — C3410 Malignant neoplasm of upper lobe, unspecified bronchus or lung: Secondary | ICD-10-CM

## 2014-12-06 MED ORDER — DIPHENHYDRAMINE HCL 25 MG PO CAPS
25.0000 mg | ORAL_CAPSULE | Freq: Once | ORAL | Status: AC
  Administered 2014-12-06: 25 mg via ORAL

## 2014-12-06 MED ORDER — SODIUM CHLORIDE 0.9 % IV SOLN
250.0000 mL | Freq: Once | INTRAVENOUS | Status: AC
  Administered 2014-12-06: 250 mL via INTRAVENOUS

## 2014-12-06 MED ORDER — DIPHENHYDRAMINE HCL 25 MG PO CAPS
ORAL_CAPSULE | ORAL | Status: AC
  Filled 2014-12-06: qty 1

## 2014-12-06 MED ORDER — ACETAMINOPHEN 325 MG PO TABS
650.0000 mg | ORAL_TABLET | Freq: Once | ORAL | Status: AC
  Administered 2014-12-06: 650 mg via ORAL

## 2014-12-06 MED ORDER — ACETAMINOPHEN 325 MG PO TABS
ORAL_TABLET | ORAL | Status: AC
  Filled 2014-12-06: qty 2

## 2014-12-06 MED ORDER — IOHEXOL 300 MG/ML  SOLN
100.0000 mL | Freq: Once | INTRAMUSCULAR | Status: AC | PRN
  Administered 2014-12-06: 100 mL via INTRAVENOUS

## 2014-12-06 NOTE — Progress Notes (Signed)
PIV left in place for CT scan appt at 1700 on 12/06/14.

## 2014-12-06 NOTE — Patient Instructions (Signed)

## 2014-12-07 LAB — TYPE AND SCREEN
ABO/RH(D): A POS
Antibody Screen: NEGATIVE
UNIT DIVISION: 0
Unit division: 0

## 2014-12-11 ENCOUNTER — Ambulatory Visit: Payer: Medicare Other

## 2014-12-11 ENCOUNTER — Other Ambulatory Visit (HOSPITAL_BASED_OUTPATIENT_CLINIC_OR_DEPARTMENT_OTHER): Payer: Medicare Other

## 2014-12-11 ENCOUNTER — Ambulatory Visit (HOSPITAL_BASED_OUTPATIENT_CLINIC_OR_DEPARTMENT_OTHER): Payer: Medicare Other | Admitting: Physician Assistant

## 2014-12-11 ENCOUNTER — Telehealth: Payer: Self-pay | Admitting: Physician Assistant

## 2014-12-11 ENCOUNTER — Encounter: Payer: Self-pay | Admitting: Physician Assistant

## 2014-12-11 VITALS — BP 170/84 | HR 75 | Temp 97.0°F | Resp 17

## 2014-12-11 DIAGNOSIS — G939 Disorder of brain, unspecified: Secondary | ICD-10-CM | POA: Diagnosis not present

## 2014-12-11 DIAGNOSIS — R5383 Other fatigue: Secondary | ICD-10-CM

## 2014-12-11 DIAGNOSIS — R599 Enlarged lymph nodes, unspecified: Secondary | ICD-10-CM | POA: Diagnosis not present

## 2014-12-11 DIAGNOSIS — C3411 Malignant neoplasm of upper lobe, right bronchus or lung: Secondary | ICD-10-CM

## 2014-12-11 DIAGNOSIS — D6481 Anemia due to antineoplastic chemotherapy: Secondary | ICD-10-CM | POA: Diagnosis not present

## 2014-12-11 DIAGNOSIS — C7971 Secondary malignant neoplasm of right adrenal gland: Secondary | ICD-10-CM

## 2014-12-11 DIAGNOSIS — R0602 Shortness of breath: Secondary | ICD-10-CM

## 2014-12-11 DIAGNOSIS — C3490 Malignant neoplasm of unspecified part of unspecified bronchus or lung: Secondary | ICD-10-CM

## 2014-12-11 DIAGNOSIS — Z5112 Encounter for antineoplastic immunotherapy: Secondary | ICD-10-CM

## 2014-12-11 DIAGNOSIS — C7972 Secondary malignant neoplasm of left adrenal gland: Secondary | ICD-10-CM

## 2014-12-11 DIAGNOSIS — Z79899 Other long term (current) drug therapy: Secondary | ICD-10-CM

## 2014-12-11 DIAGNOSIS — C779 Secondary and unspecified malignant neoplasm of lymph node, unspecified: Secondary | ICD-10-CM | POA: Diagnosis not present

## 2014-12-11 DIAGNOSIS — J449 Chronic obstructive pulmonary disease, unspecified: Secondary | ICD-10-CM

## 2014-12-11 LAB — COMPREHENSIVE METABOLIC PANEL (CC13)
ALBUMIN: 3.4 g/dL — AB (ref 3.5–5.0)
ALT: 16 U/L (ref 0–55)
ANION GAP: 11 meq/L (ref 3–11)
AST: 18 U/L (ref 5–34)
Alkaline Phosphatase: 76 U/L (ref 40–150)
BUN: 7.5 mg/dL (ref 7.0–26.0)
CO2: 29 mEq/L (ref 22–29)
Calcium: 9 mg/dL (ref 8.4–10.4)
Chloride: 98 mEq/L (ref 98–109)
Creatinine: 0.7 mg/dL (ref 0.7–1.3)
GLUCOSE: 119 mg/dL (ref 70–140)
POTASSIUM: 3.8 meq/L (ref 3.5–5.1)
SODIUM: 138 meq/L (ref 136–145)
Total Bilirubin: 0.27 mg/dL (ref 0.20–1.20)
Total Protein: 6.9 g/dL (ref 6.4–8.3)

## 2014-12-11 LAB — CBC WITH DIFFERENTIAL/PLATELET
BASO%: 0.5 % (ref 0.0–2.0)
Basophils Absolute: 0 10*3/uL (ref 0.0–0.1)
EOS ABS: 0 10*3/uL (ref 0.0–0.5)
EOS%: 0.7 % (ref 0.0–7.0)
HCT: 32.7 % — ABNORMAL LOW (ref 38.4–49.9)
HGB: 10.9 g/dL — ABNORMAL LOW (ref 13.0–17.1)
LYMPH%: 27.9 % (ref 14.0–49.0)
MCH: 30.9 pg (ref 27.2–33.4)
MCHC: 33.4 g/dL (ref 32.0–36.0)
MCV: 92.5 fL (ref 79.3–98.0)
MONO#: 0.8 10*3/uL (ref 0.1–0.9)
MONO%: 18.5 % — AB (ref 0.0–14.0)
NEUT#: 2.4 10*3/uL (ref 1.5–6.5)
NEUT%: 52.4 % (ref 39.0–75.0)
PLATELETS: 285 10*3/uL (ref 140–400)
RBC: 3.53 10*6/uL — ABNORMAL LOW (ref 4.20–5.82)
RDW: 17.6 % — ABNORMAL HIGH (ref 11.0–14.6)
WBC: 4.5 10*3/uL (ref 4.0–10.3)
lymph#: 1.3 10*3/uL (ref 0.9–3.3)

## 2014-12-11 MED ORDER — HYDROCODONE-ACETAMINOPHEN 7.5-325 MG PO TABS: 1.0000 | ORAL_TABLET | Freq: Four times a day (QID) | ORAL | Status: DC | PRN

## 2014-12-11 NOTE — Progress Notes (Signed)
[facility] [phone]   [phone]  OFFICE PROGRESS NOTE  [name], MD [address] [place] [address]  DIAGNOSIS: Metastatic non-small cell lung cancer, squamous cell carcinoma initially diagnosed as Stage IIA (T2b., N0, M0) non-small cell lung cancer, squamous cell carcinoma diagnosed in December of 2013   PRIOR THERAPY:  1) Status post right upper lobectomy under the care of Dr. Prescott Gum on 09/14/2012.  2) Adjuvant chemotherapy with cisplatin 75 mg/M2 and docetaxel 75 mg/M2 with Neulasta support every 3 weeks, status post 1 cycle. Starting with cycle #2, patient will receive carboplatin for an AUC initially of 4.5 and paclitaxel at 175 mg per meter squared with Neulasta support given every 3 weeks. Status post a total of 3 cycles.   CURRENT THERAPY: Systemic chemotherapy with carboplatin for AUC of 5 on day 1 and gemcitabine 1000 MG/M2 on days 1 and 8 every 3 weeks. First dose 09/11/2014. Status post 4 cycles  CHEMOTHERAPY INTENT: Palliative. CURRENT # OF CHEMOTHERAPY CYCLES: 4 CURRENT ANTIEMETICS: Zofran, dexamethasone and Compazine  CURRENT SMOKING STATUS: Quit smoking recently.  ORAL CHEMOTHERAPY AND CONSENT: None  CURRENT BISPHOSPHONATES USE: None  PAIN MANAGEMENT: 3/10 controlled by Norco when necessary  NARCOTICS INDUCED CONSTIPATION: Milk of magnesia on as-needed basis.  LIVING WILL AND CODE STATUS: No CODE BLUE.   INTERVAL HISTORY:  [name] 74 y.o. male returns to the clinic today for followup visit accompanied his wife and daughter. The patient is currently undergoing systemic chemotherapy with carboplatin and gemcitabine status post 4 cycles. He complains of having no energy, feeling short of breath and having no appetite. He continues to complain of increased shortness of breath and is currently on 2-3 L of oxygen via nasal cannula on a daily basis.  He denied having any significant chest pain but has  mild cough and no hemoptysis. He quit smoking recently. He denied having any significant nausea or vomiting, no fever or chills, no night sweats. Patient his family are concerned as to how much is treatment is taking out of him. He reports he is scheduled to see a pulmonologist, Dr. Elsworth Soho on January 10, 2015 for further evaluation and management of his COPD.   MEDICAL HISTORY: Past Medical History  Diagnosis Date  . Arthritis   . Wheezing   . Sore throat   . Carotid artery occlusion   . COPD (chronic obstructive pulmonary disease)   . Lumbar disc disease   . Restless leg syndrome   . Allergic rhinitis   . Hypertension   . Anxiety     anxiety  . Shortness of breath   . Lung mass   . GERD (gastroesophageal reflux disease)   . H/O hiatal hernia   . Pre-operative cardiovascular examination   . Nonspecific abnormal unspecified cardiovascular function study   . Peripheral vascular disease, unspecified   . Pneumonia 2014    ?   Marland Kitchen On home oxygen therapy     prn  . Constipation   . Cancer 09/14/12    invasive mod diff squamous cell ca  . S/P radiation therapy 09/25/14    brain mets 20Gy    ALLERGIES:  has No Known Allergies.  MEDICATIONS:  Current Outpatient Prescriptions  Medication Sig Dispense Refill  . albuterol (PROVENTIL) (2.5 MG/3ML) 0.083% nebulizer solution Take 2.5 mg by nebulization every 6 (six) hours as needed for wheezing or shortness of breath.    Marland Kitchen amLODipine (NORVASC)  5 MG tablet Take 1 tablet (5 mg total) by mouth daily. 30 tablet 9  . aspirin EC 81 MG tablet Take 81 mg by mouth daily.     . clonazePAM (KLONOPIN) 0.5 MG tablet Take 0.5 mg by mouth 2 (two) times daily as needed for anxiety. Takes one tablet by mouth two times daily as needed for anxiety.    Marland Kitchen HYDROcodone-acetaminophen (NORCO) 7.5-325 MG per tablet Take 1 tablet by mouth every 6 (six) hours as needed for moderate pain. 30 tablet 0  . LORazepam (ATIVAN) 0.5 MG tablet Take 1 tablet by mouth or sublingually  every 8 hours as needed for nausea. May take 1 tablet by mouth at bedtime. 40 tablet 0  . metoprolol tartrate (LOPRESSOR) 25 MG tablet Take 25 mg by mouth daily.    Marland Kitchen omeprazole (PRILOSEC) 40 MG capsule Take 40 mg by mouth daily.    . pramipexole (MIRAPEX) 1 MG tablet Take 1 mg by mouth at bedtime.    . traMADol (ULTRAM) 50 MG tablet Take 1 tablet (50 mg total) by mouth every 12 (twelve) hours as needed. 30 tablet 0  . zolpidem (AMBIEN) 10 MG tablet Take 10 mg by mouth at bedtime as needed for sleep.     No current facility-administered medications for this visit.    SURGICAL HISTORY:  Past Surgical History  Procedure Laterality Date  . Carotid endarterectomy  10/15/2010    left  . Hand surgery      repair of the left long, ring, and small finger after a saw injury  . Video bronchoscopy  07/24/2012    Procedure: VIDEO BRONCHOSCOPY WITH FLUORO;  Surgeon: Kathee Delton, MD;  Location: [facility];  Service: Cardiopulmonary;  Laterality: Bilateral;  . Lobectomy Right 09/14/2012    Procedure: LOBECTOMY;  Surgeon: Ivin Poot, MD;  Location: [facility];  Service: Thoracic;  Laterality: Right;  RIGHT UPPER LOBECTOMY  . Video assisted thoracoscopy Right 09/14/2012    Procedure: VIDEO ASSISTED THORACOSCOPY;  Surgeon: Ivin Poot, MD;  Location: [facility];  Service: Thoracic;  Laterality: Right;  . Video bronchoscopy with endobronchial ultrasound N/A 08/27/2014    Procedure: VIDEO BRONCHOSCOPY WITH ENDOBRONCHIAL ULTRASOUND;  Surgeon: Ivin Poot, MD;  Location: [facility];  Service: Thoracic;  Laterality: N/A;  . Scalene node biopsy Right 08/27/2014    Procedure: BIOPSY SCALENE NODE;  Surgeon: Ivin Poot, MD;  Location: [facility];  Service: Thoracic;  Laterality: Right;  . Sterotactic radiosurgery Right 09/25/14    right parietal 30Gy/1 fx    REVIEW OF SYSTEMS:  Constitutional: positive for anorexia, fatigue, malaise and weight loss Eyes: negative Ears, nose, mouth, throat, and face:  negative Respiratory: positive for cough, dyspnea on exertion and wheezing Cardiovascular: negative Gastrointestinal: negative Genitourinary:negative Integument/breast: negative Hematologic/lymphatic: negative Musculoskeletal:negative Neurological: negative Behavioral/Psych: negative Endocrine: negative Allergic/Immunologic: negative   PHYSICAL EXAMINATION: General appearance: alert, cooperative, fatigued and no distress Head: Normocephalic, without obvious abnormality, atraumatic Neck: no adenopathy, no JVD, supple, symmetrical, trachea midline and thyroid not enlarged, symmetric, no tenderness/mass/nodules Lymph nodes: Cervical, supraclavicular, and axillary nodes normal. Resp: wheezes bilaterally Back: symmetric, no curvature. ROM normal. No CVA tenderness. Cardio: regular rate and rhythm, S1, S2 normal, no murmur, click, rub or gallop GI: soft, non-tender; bowel sounds normal; no masses,  no organomegaly Extremities: extremities normal, atraumatic, no cyanosis or edema Neurologic: Alert and oriented X 3, normal strength and tone. Normal symmetric reflexes. Normal coordination and gait  ECOG PERFORMANCE STATUS: 1 - Symptomatic but  completely ambulatory  Blood pressure 170/84, pulse 75, temperature 97 F (36.1 C), temperature source Oral, resp. rate 17, SpO2 97 %.  LABORATORY DATA: Lab Results  Component Value Date   WBC 4.5 12/11/2014   HGB 10.9* 12/11/2014   HCT 32.7* 12/11/2014   MCV 92.5 12/11/2014   PLT 285 12/11/2014      Chemistry      Component Value Date/Time   NA 138 12/11/2014 0850   NA 138 12/05/2014 0618   K 3.8 12/11/2014 0850   K 3.4* 12/05/2014 0618   CL 95* 12/05/2014 0618   CL 101 12/28/2012 0852   CO2 29 12/11/2014 0850   CO2 32 12/05/2014 0618   BUN 7.5 12/11/2014 0850   BUN 9 12/05/2014 0618   CREATININE 0.7 12/11/2014 0850   CREATININE 0.52* 12/05/2014 0618      Component Value Date/Time   CALCIUM 9.0 12/11/2014 0850   CALCIUM 9.2  12/05/2014 0618   ALKPHOS 76 12/11/2014 0850   ALKPHOS 71 12/05/2014 0618   AST 18 12/11/2014 0850   AST 24 12/05/2014 0618   ALT 16 12/11/2014 0850   ALT 30 12/05/2014 0618   BILITOT 0.27 12/11/2014 0850   BILITOT 0.2* 12/05/2014 0618       RADIOGRAPHIC STUDIES: Dg Chest 2 View  11/13/2014   CLINICAL DATA:  Shortness of breath, COPD, 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  Atherosclerotic calcification aorta.  Postsurgical changes at the RIGHT suprahilar region.  Question LEFT upper lobe infiltrate.  Underlying emphysematous changes.  Tenting and probable scarring at RIGHT lung base unchanged.  No pleural effusion or pneumothorax.  Bones diffusely demineralized.  IMPRESSION: Chronic lung changes and scarring in the RIGHT hemi thorax suspect related to previous surgery.  COPD changes with questionable LEFT upper lobe infiltrate medially.   Electronically Signed   By: Lavonia Dana M.D.   On: 11/13/2014 10:07   Ct Chest W Contrast  12/06/2014   CLINICAL DATA:  Followup lung cancer.  Brain metastasis.  EXAM: CT CHEST, ABDOMEN, AND PELVIS WITH CONTRAST  TECHNIQUE: Multidetector CT imaging of the chest, abdomen and pelvis was performed following the standard protocol during bolus administration of intravenous contrast.  CONTRAST:  126m OMNIPAQUE IOHEXOL 300 MG/ML  SOLN  COMPARISON:  PET-CT 08/10/2014.  FINDINGS: CT CHEST FINDINGS  Mediastinum: The heart size appears normal. Aortic atherosclerosis identified. There is multi vessel coronary artery calcification. The trachea appears patent. Normal appearance of the esophagus. Index right supraclavicular lymph node measures 1.6 cm, image number 3/series 2. Previously 1.1 cm. The index right retropectoral lymph node measures 8 mm, image 10/series 2. Previously 6 mm. Index right  axillary lymph node measures 1.2 cm, image number 15/series 2. Previously this measured the same. Index sub- carinal lymph node appears improved measuring 6 mm, image 27/ series 2. Previously 13 mm.  Lungs/Pleura: Again noted is mild pleural thickening overlying both lungs. Innumerable small pulmonary nodules [place] are not throughout both lungs bilaterally. Index pulmonary nodule within the posterior right upper lung measures 0.8 cm, image 44/series 5. Previously 1.1 cm. Index peripheral right midlung nodule measures 9 mm, image 33/ series 5. Previously 1.2 cm. Index posterior right lower lobe nodule measures 7 mm, image 42/ series 5. Previously 9 mm.  Musculoskeletal: No or aggressive lytic or sclerotic bone lesions identified.  CT  ABDOMEN AND PELVIS FINDINGS  Hepatobiliary: There is no suspicious liver abnormality identified. The gallbladder is normal. No biliary dilatation.  Pancreas: Unremarkable appearance of the pancreas.  Spleen: The spleen is normal.  Adrenals/Urinary Tract: Unremarkable appearance of the left adrenal gland. Nodule in the right adrenal gland measures 1.8 by 1.1 cm, image 57/series 2. Previously 1.4 x 0.8 cm. Focal area of low attenuation involving the right kidney measures 2.7 cm, image 65/series 2. Suspicious for kidney metastasis. In retrospect this was present on previous PET scan. Normal appearance of the left kidney. Urinary bladder appears normal.  Stomach/Bowel: The stomach is within normal limits. The small bowel loops have a normal course and caliber. No obstruction. Normal appearance of the colon.  Vascular/Lymphatic: Calcified atherosclerotic disease involves the abdominal aorta. No aneurysm. The infrarenal abdominal aorta measures 2.4 cm in maximum AP dimension. No enlarged retroperitoneal or mesenteric adenopathy. No enlarged pelvic or inguinal lymph nodes.  Reproductive: Prostate gland and seminal vesicles appear normal.  Other: There is no ascites or focal fluid collections  within the abdomen or pelvis.  Musculoskeletal: Extensive spondylosis identified within the lumbar spine. Anterolisthesis of L5 on S1 is noted. 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  IMPRESSION: 1. Mixed interval response to therapy. The pulmonary nodules are stable to decreased in size from previous exam. The sub- carinal lymph node is also decreased in size in the interval. 2. Mild increase in size of right supraclavicular and right retropectoral lymph nodes. Stable appearance of right axillary node. 3. 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 4. 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 5. Aortic atherosclerosis. Multi vessel Coronary artery calcification also noted.   Electronically Signed   By: Kerby Moors M.D.   On: 12/06/2014 19:56   Mr Jeri Cos FU Contrast  11/22/2014   CLINICAL DATA:  Lung cancer. SRS treatment of solitary right parietal metastasis 09/25/2014. Two month restaging scan.  EXAM: MRI HEAD WITHOUT AND WITH CONTRAST  TECHNIQUE: Multiplanar, multiecho pulse sequences of the brain and surrounding structures were obtained without and with intravenous contrast.  CONTRAST:  8m MULTIHANCE GADOBENATE DIMEGLUMINE 529 MG/ML IV SOLN  COMPARISON:  09/17/2014  FINDINGS: Images are mildly degraded by motion artifact. There is a 2 mm punctate focus of restricted diffusion in the left centrum semiovale without associated enhancement. There is no evidence of intracranial hemorrhage, midline shift, or extra-axial fluid collection. There is mild generalized cerebral atrophy. Small foci of T2 hyperintensity in the cerebral white matter and pons are similar to the prior study and nonspecific but compatible with minimal chronic small vessel ischemic disease.  The 2 mm enhancing right parietal cortical lesion is no longer identified (series 10, image 120), however axial (and to a lesser extent sagittal and coronal) postcontrast images are mildly motion degraded which decreases the sensitivity for  detection of small lesions. A punctate focus of increased cortical signal in an adjacent right parietal gyrus on axial T1 postcontrast images is felt to be artifactual, not confirmed in other planes. There is a new 3 mm enhancing cortical lesion in the left frontal lobe (series 10, image 98).  Prior right cataract extraction is noted. There is a trace left mastoid effusion, and there is trace right sphenoid sinus fluid. Major intracranial vascular flow voids are preserved.  IMPRESSION: 1. 2 mm right parietal lesion no longer identified. 2. New 3 mm left frontal metastasis. 3. Punctate, acute small vessel infarct in the left centrum semiovale. These results will be called  to the ordering clinician or representative by the Radiologist Assistant, and communication documented in the PACS or zVision Dashboard.   Electronically Signed   By: Logan Bores   On: 11/22/2014 15:31   Ct Abdomen Pelvis W Contrast  12/06/2014   CLINICAL DATA:  Followup lung cancer.  Brain metastasis.  EXAM: CT CHEST, ABDOMEN, AND PELVIS WITH CONTRAST  TECHNIQUE: Multidetector CT imaging of the chest, abdomen and pelvis was performed following the standard protocol during bolus administration of intravenous contrast.  CONTRAST:  167m OMNIPAQUE IOHEXOL 300 MG/ML  SOLN  COMPARISON:  PET-CT 08/10/2014.  FINDINGS: CT CHEST FINDINGS  Mediastinum: The heart size appears normal. Aortic atherosclerosis identified. There is multi vessel coronary artery calcification. The trachea appears patent. Normal appearance of the esophagus. Index right supraclavicular lymph node measures 1.6 cm, image number 3/series 2. Previously 1.1 cm. The index right retropectoral lymph node measures 8 mm, image 10/series 2. Previously 6 mm. Index right axillary lymph node measures 1.2 cm, image number 15/series 2. Previously this measured the same. Index sub- carinal lymph node appears improved measuring 6 mm, image 27/ series 2. Previously 13 mm.  Lungs/Pleura: Again noted  is mild pleural thickening overlying both lungs. Innumerable small pulmonary nodules [place] are not throughout both lungs bilaterally. Index pulmonary nodule within the posterior right upper lung measures 0.8 cm, image 44/series 5. Previously 1.1 cm. Index peripheral right midlung nodule measures 9 mm, image 33/ series 5. Previously 1.2 cm. Index posterior right lower lobe nodule measures 7 mm, image 42/ series 5. Previously 9 mm.  Musculoskeletal: No or aggressive lytic or sclerotic bone lesions identified.  CT ABDOMEN AND PELVIS FINDINGS  Hepatobiliary: There is no suspicious liver abnormality identified. The gallbladder is normal. No biliary dilatation.  Pancreas: Unremarkable appearance of the pancreas.  Spleen: The spleen is normal.  Adrenals/Urinary Tract: Unremarkable appearance of the left adrenal gland. Nodule in the right adrenal gland measures 1.8 by 1.1 cm, image 57/series 2. Previously 1.4 x 0.8 cm. Focal area of low attenuation involving the right kidney measures 2.7 cm, image 65/series 2. Suspicious for kidney metastasis. In retrospect this was present on previous PET scan. Normal appearance of the left kidney. Urinary bladder appears normal.  Stomach/Bowel: The stomach is within normal limits. The small bowel loops have a normal course and caliber. No obstruction. Normal appearance of the colon.  Vascular/Lymphatic: Calcified atherosclerotic disease involves the abdominal aorta. No aneurysm. The infrarenal abdominal aorta measures 2.4 cm in maximum AP dimension. No enlarged retroperitoneal or mesenteric adenopathy. No enlarged pelvic or inguinal lymph nodes.  Reproductive: Prostate gland and seminal vesicles appear normal.  Other: There is no ascites or focal fluid collections within the abdomen or pelvis.  Musculoskeletal: Extensive spondylosis identified within the lumbar spine. Anterolisthesis of L5 on S1 is noted. 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  IMPRESSION: 1. Mixed interval response to  therapy. The pulmonary nodules are stable to decreased in size from previous exam. The sub- carinal lymph node is also decreased in size in the interval. 2. Mild increase in size of right supraclavicular and right retropectoral lymph nodes. Stable appearance of right axillary node. 3. 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 4. 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 5. Aortic atherosclerosis. Multi vessel Coronary artery calcification also noted.   Electronically Signed   By: TKerby MoorsM.D.   On: 12/06/2014 19:56   Dg Chest Portable 1 View  12/05/2014   CLINICAL DATA:  Productive  cough with bloody sputum. History of lung cancer with metastases  EXAM: PORTABLE CHEST - 1 VIEW  COMPARISON:  11/13/2014  FINDINGS: Normal heart size and stable mediastinal contours, distorted on the right from previous surgery.  There is diffuse interstitial coarsening, recently evaluated by chest CT 10/07/2014. No gross alveolar hemorrhage, pneumonia, effusion, or air leak.  IMPRESSION: 1. No change from 11/13/2014 to explain new hemoptysis. 2. Lung cancer with post treatment changes on the right, most recently staged 10/07/2014.   Electronically Signed   By: Monte Fantasia M.D.   On: 12/05/2014 06:24    ASSESSMENT AND PLAN: This is a very pleasant 74 years old white male with history of stage IIa non-small cell lung cancer status post right upper lobectomy followed by 4 cycles of adjuvant chemotherapy.  He now presented with evidence for significant disease recurrence including lymphangitic spread of the tumor in addition to bilateral hilar and mediastinal lymphadenopathy in addition to right axillary or right subpectoral and right supraclavicular lymphadenopathy and right sided adrenal metastasis. He also has a very small brain lesion measuring 2 mm. The patient is currently undergoing systemic chemotherapy with carboplatin and gemcitabine status post 4 cycles and tolerating the treatment well except for  increased shortness of breath and fatigue.  Patient was discussed with and also seen by [name]. His most recent CT scan of the chest showed a mixed response. Given his decline in performance status and the mixed response on the CT scan, we will discontinue treatment with carboplatin and gemcitabine. We will plan to start him on immunotherapy with Nivolumab. First cycle expected 12/18/2014. Adverse effects including but not limited to abnormalities in liver fution, thyroid function, pneumonitis and colitis were reviewed. Mr. Haymore would like to proceed with immunotherapy. He will follow up in 3 weeks for a symptom management visit prior to the start of cycle #2.  Patient, his wife, and daughter were in agreement with this plan.   He was advised to call immediately if he has any concerning symptoms in the interval.  The patient voices understanding of current disease status and treatment options and is in agreement with the current care plan.  All questions were answered. The patient knows to call the clinic with any problems, questions or concerns. We can certainly see the patient much sooner if necessary.  Carlton Adam, PA-C 12/11/2014  ADDENDUM: Hematology/Oncology Attending: I had a face to face encounter with the patient. I recommended his care plan. This is a very pleasant 74 years old white male with metastatic non-small cell lung cancer, squamous cell carcinoma is currently undergoing systemic chemotherapy with carboplatin and gemcitabine status post 4 cycles. The patient is tolerating his treatment fairly well except for significant fatigue and weakness secondary to chemotherapy-induced anemia He also continues to complain of shortness of breath increased recently. The recent CT scan of the chest, abdomen and pelvis showed mixed response. I had a lengthy discussion with the patient and his family about his current disease status and treatment options. I gave the patient the option  of continuing with his current systemic chemotherapy with carboplatin and gemcitabine versus switching to his second line therapy with immunotherapy with Nivolumab 3 MG/KG every 2 weeks. I discussed with the patient adverse effect of the immunotherapy including but not limited to immune mediated skin rash, diarrhea, pneumonitis, liver, renal dysfunction as well as endocrine dysfunction. The patient and his family are interested in proceeding with the immunotherapy. He is expected to start the first cycle  of this treatment next week. The patient would come back for follow-up visit in 3 weeks with the start of cycle #2. He was advised to call immediately if he has any concerning symptoms in the interval.   Disclaimer: This note was dictated with voice recognition software. Similar sounding words can inadvertently be transcribed and may be missed upon review. Eilleen Kempf., MD 12/16/2014

## 2014-12-11 NOTE — Patient Instructions (Signed)
Your recent restaging CT scan showed a mixed response. We will discontinue your current chemotherapy. You will begin treatment with immunotherapy Follow up with labs, treatment and office visits as scheduled

## 2014-12-11 NOTE — Telephone Encounter (Signed)
Spoke with patients wife and she is aware of the appointments

## 2014-12-13 ENCOUNTER — Other Ambulatory Visit: Payer: Self-pay | Admitting: *Deleted

## 2014-12-13 DIAGNOSIS — Z85118 Personal history of other malignant neoplasm of bronchus and lung: Secondary | ICD-10-CM

## 2014-12-13 MED ORDER — HYDROCODONE-ACETAMINOPHEN 7.5-325 MG PO TABS: 1.0000 | ORAL_TABLET | Freq: Four times a day (QID) | ORAL | Status: DC | PRN

## 2014-12-13 NOTE — Telephone Encounter (Signed)
Family member states that patient has misplaced his norco prescription that was given to him on 12/11/14. Awilda Metro PA notified. Prescription refilled and patient notified. Will pick up 12/18/14.

## 2014-12-16 DIAGNOSIS — Z5112 Encounter for antineoplastic immunotherapy: Secondary | ICD-10-CM | POA: Insufficient documentation

## 2014-12-17 ENCOUNTER — Telehealth: Payer: Self-pay | Admitting: Interventional Cardiology

## 2014-12-17 MED ORDER — AMLODIPINE BESYLATE 10 MG PO TABS: 10.0000 mg | ORAL_TABLET | Freq: Every day | ORAL | Status: DC

## 2014-12-17 NOTE — Telephone Encounter (Signed)
Pt states his BP went down for a few days after starting Amiodarone in May and then it went back up. Last BP's- 200/105, 169/97, 193/97- this AM it was 173/105.  HR stays in mid 90's except once was 105. Pt states that BP is always high in the mornings and late evenings.  BP comes down in the afternoon but is still higher then it should be. Pt c/o pain in his right side.  Wife states pt has CA in both lungs, spleen and neck area. Pt states BP at 2AM was 190/105. Pt confirmed that he does take Amlodipine '5mg'$  and Metoprolol Tartrate '25mg'$ . Will route to Dr. Irish Lack for review and advisement.

## 2014-12-17 NOTE — Telephone Encounter (Signed)
OK to increase amlodipine to 10 mg daily.

## 2014-12-17 NOTE — Telephone Encounter (Signed)
New message    Wife calling     Pt c/o BP issue: STAT if pt c/o blurred vision, one-sided weakness or slurred speech  1. What are your last 5 BP readings? Yesterday 200/105 , retake 169/97 ,yesterday  193/97 , this am 173/105 ,    2. Are you having any other symptoms (ex. Dizziness, headache, blurred vision, passed out)?  A lots of headache.    3. What is your BP issue?  Wife is asking to look at his blood pressure medication - was advise by both MD to take both medication.

## 2014-12-17 NOTE — Telephone Encounter (Signed)
Spoke with wife and informed her of new order to increase Amlodipine to '10mg'$  daily. Wife verbalized understanding.

## 2014-12-18 ENCOUNTER — Ambulatory Visit (HOSPITAL_BASED_OUTPATIENT_CLINIC_OR_DEPARTMENT_OTHER): Payer: Medicare Other

## 2014-12-18 ENCOUNTER — Other Ambulatory Visit (HOSPITAL_BASED_OUTPATIENT_CLINIC_OR_DEPARTMENT_OTHER): Payer: Medicare Other

## 2014-12-18 ENCOUNTER — Encounter: Payer: Self-pay | Admitting: Pulmonary Disease

## 2014-12-18 ENCOUNTER — Ambulatory Visit (INDEPENDENT_AMBULATORY_CARE_PROVIDER_SITE_OTHER): Payer: Medicare Other | Admitting: Pulmonary Disease

## 2014-12-18 VITALS — BP 150/80 | HR 114 | Temp 97.0°F | Wt 168.0 lb

## 2014-12-18 VITALS — BP 116/74 | HR 100 | Temp 98.3°F | Resp 20

## 2014-12-18 DIAGNOSIS — R0600 Dyspnea, unspecified: Secondary | ICD-10-CM

## 2014-12-18 DIAGNOSIS — C3411 Malignant neoplasm of upper lobe, right bronchus or lung: Secondary | ICD-10-CM

## 2014-12-18 DIAGNOSIS — Z79899 Other long term (current) drug therapy: Secondary | ICD-10-CM | POA: Diagnosis not present

## 2014-12-18 DIAGNOSIS — I10 Essential (primary) hypertension: Secondary | ICD-10-CM

## 2014-12-18 DIAGNOSIS — C7971 Secondary malignant neoplasm of right adrenal gland: Secondary | ICD-10-CM

## 2014-12-18 DIAGNOSIS — Z5112 Encounter for antineoplastic immunotherapy: Secondary | ICD-10-CM | POA: Diagnosis present

## 2014-12-18 DIAGNOSIS — C3490 Malignant neoplasm of unspecified part of unspecified bronchus or lung: Secondary | ICD-10-CM

## 2014-12-18 DIAGNOSIS — I2510 Atherosclerotic heart disease of native coronary artery without angina pectoris: Secondary | ICD-10-CM | POA: Diagnosis not present

## 2014-12-18 DIAGNOSIS — J441 Chronic obstructive pulmonary disease with (acute) exacerbation: Secondary | ICD-10-CM

## 2014-12-18 LAB — CBC WITH DIFFERENTIAL/PLATELET
BASO%: 2.1 % — ABNORMAL HIGH (ref 0.0–2.0)
Basophils Absolute: 0.1 10*3/uL (ref 0.0–0.1)
EOS%: 1 % (ref 0.0–7.0)
Eosinophils Absolute: 0 10*3/uL (ref 0.0–0.5)
HCT: 34.7 % — ABNORMAL LOW (ref 38.4–49.9)
HGB: 11.8 g/dL — ABNORMAL LOW (ref 13.0–17.1)
LYMPH#: 1.5 10*3/uL (ref 0.9–3.3)
LYMPH%: 36 % (ref 14.0–49.0)
MCH: 31.5 pg (ref 27.2–33.4)
MCHC: 34.1 g/dL (ref 32.0–36.0)
MCV: 92.5 fL (ref 79.3–98.0)
MONO#: 1 10*3/uL — AB (ref 0.1–0.9)
MONO%: 24.6 % — ABNORMAL HIGH (ref 0.0–14.0)
NEUT%: 36.3 % — ABNORMAL LOW (ref 39.0–75.0)
NEUTROS ABS: 1.5 10*3/uL (ref 1.5–6.5)
PLATELETS: 556 10*3/uL — AB (ref 140–400)
RBC: 3.75 10*6/uL — ABNORMAL LOW (ref 4.20–5.82)
RDW: 17.7 % — ABNORMAL HIGH (ref 11.0–14.6)
WBC: 4.2 10*3/uL (ref 4.0–10.3)

## 2014-12-18 LAB — COMPREHENSIVE METABOLIC PANEL (CC13)
ALT: 12 U/L (ref 0–55)
ANION GAP: 9 meq/L (ref 3–11)
AST: 19 U/L (ref 5–34)
Albumin: 3.7 g/dL (ref 3.5–5.0)
Alkaline Phosphatase: 81 U/L (ref 40–150)
BUN: 7 mg/dL (ref 7.0–26.0)
CO2: 30 mEq/L — ABNORMAL HIGH (ref 22–29)
Calcium: 9.7 mg/dL (ref 8.4–10.4)
Chloride: 98 mEq/L (ref 98–109)
Creatinine: 0.8 mg/dL (ref 0.7–1.3)
EGFR: >90 mL/min/{1.73_m2} (ref >90)
GLUCOSE: 102 mg/dL (ref 70–140)
Potassium: 3.7 mEq/L (ref 3.5–5.1)
Sodium: 138 mEq/L (ref 136–145)
Total Bilirubin: 0.35 mg/dL (ref 0.20–1.20)
Total Protein: 7.3 g/dL (ref 6.4–8.3)

## 2014-12-18 LAB — TSH CHCC: TSH: 1.282 m(IU)/L (ref 0.320–4.118)

## 2014-12-18 MED ORDER — METHYLPREDNISOLONE ACETATE 80 MG/ML IJ SUSP
80.0000 mg | Freq: Once | INTRAMUSCULAR | Status: AC
  Administered 2014-12-18: 80 mg via INTRAMUSCULAR

## 2014-12-18 MED ORDER — IPRATROPIUM BROMIDE 0.02 % IN SOLN: 0.5000 mg | Freq: Four times a day (QID) | RESPIRATORY_TRACT | Status: DC

## 2014-12-18 MED ORDER — SODIUM CHLORIDE 0.9 % IV SOLN
3.0000 mg/kg | Freq: Once | INTRAVENOUS | Status: AC
  Administered 2014-12-18: 260 mg via INTRAVENOUS
  Filled 2014-12-18: qty 26

## 2014-12-18 MED ORDER — PREDNISONE 20 MG PO TABS: ORAL_TABLET | ORAL | Status: DC

## 2014-12-18 MED ORDER — SODIUM CHLORIDE 0.9 % IV SOLN
Freq: Once | INTRAVENOUS | Status: AC
  Administered 2014-12-18: 09:00:00 via INTRAVENOUS

## 2014-12-18 MED ORDER — IPRATROPIUM-ALBUTEROL 0.5-2.5 (3) MG/3ML IN SOLN: 3.0000 mL | Freq: Four times a day (QID) | RESPIRATORY_TRACT | Status: AC

## 2014-12-18 NOTE — Progress Notes (Signed)
Patient complains of on-going constant achy right rib cage pain. Patient rates the pain a 2 on the 0 to 10 pain scale. This RN offered to contact a provider for pain medication, patient declined stating he doesn't want anything while he's here. Patient verbalized understanding he is to inform a nurse if pain increases or he wants something for pain.

## 2014-12-18 NOTE — Progress Notes (Signed)
Subjective:    Patient ID: [name], male    DOB: 08/16/1940, 74 y.o.   MRN: [id number]  HPI  Baseline PFT 11/2011 showed FVC=3.17 (72%), FEV1=1.90 (63%), %1sec=60, mid-flows reduced at 32% predicted; 3% improvement in FEV1 after bronchodil; air trapping on LVs and DLCO was wnl...   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   3/14> he had RULobectomy by Dr. 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  He saw DrMohammed in consult & was quoted a 5-10% 76yrsurvival w. 4 cycles of adjuvant chemotherapy => completed 12/2012...  He remained disease free for 1.5 yrs, but continued smoking...   ~  July 19, 2014:  OV w/ KC>  Patient comes in today for follow-up of his known COPD. He was started on stiolto at the last visit, and did see some improvement. However, he thought the prednisone helped him more than anything else. Unfortunately he is continuing to smoke, and this is resulting in significant mucus production and cough. However, the mucus is nonpurulent. His family member is asking about chronic prednisone, and I have told her this is used as a last resort. He has been tried on inhaled corticosteroids in the past, but has had poor tolerance because of throat irritation and thrush. He has not been tried on small particle size Qvar.   F/u CT Chest 07/2014 showed new pulm nodules and extensive adenopathy- mediastinal, hilar, axillary...  Subseq PET scan 1/16 showed pos for widespread lymphangitic dis in both lungs, bilat hilar/  mediastinal/ right axillary, right supraclavic nodes, and right adrenal gland...  MRI Br 1/16 showed small enhancing lesion in right parietal lobe...  DrMohammed referred to Dr. VPrescott Gumfor bronch w/ EUS bx of mediastinal nodes=> pos EBUS and scalene node bx w/ metastatic squamous cell cancer.  3/16> DrM indicated to pt that his disease is incurable & further treatment palliative => they proceeded w/ further chemotherapy, 4 cycles completed 5/16...  He saw DrMoody in XRT 3/16 due to HAs & they gave 20Gy radiation in 1 fraction but HAs continued...  5/16> repeat MRI Brain showed resolution of small right parietal lesion , but new 312mleft frontal lesion & acute punctate small vessel infarct in the left centrum semiovale => one more dose of 20Gy radiation given 12/04/14...  F/u CT Chest/ Abd/ Pelvis 12/06/14 showed a mixed response to therapy w/ innumerable pulm nodules- some same or smaller, but nodes same or larger, liver ok but right adrenal met larger, & right kidney lesion is suspicious for met; vascular & coronary calcif, extensive spondylosis in lumbar spine...   DrMohammed offered the pt Opdivo immunotherapy & 1st dose given 12/18/14...  ~  December 18, 2014:  Add-on appt w/ SN for increased SOB & cough> pt c/o several month hx of increased SOB, cough, thick beige sput & intermittent wheezing, no hemoptysis; now on Opdivo for lung cancer palliative rx- I reviewed his hx in detail & went over his recent CT Chest/ Abd Pelvis results (their understanding was minimal & he does not have  a grasp of the prognosis- eg wife was surprised about the atherosclerosis & wanted to know what could be done about this)...    COPD, quit smoking 09/2014> on O2 at 2-3L/min, NEBS w/ Albut prn; he is very congested w/ bilat rhonchi & wheezing; we discussed Rx w/ DUONEB Qid, MUCINEX Qid + fluids, and Depo80+PRED taper...    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    HBP & ASHD> followed by [facility], his notes reviewed, on ECASA81, Metop25/d, Amlod10; BP= 150/80 and he knows that he can take an extra Metop25 daily if needed for BP...    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    GERD> on Protionix40    RLS> on Mirapex 14m Qhs...    Anxiety/ insomnia> on Klonopin0.5Bid, Ativan0.5 prn, Ambien10 prn...    Anemia> Labs by DrMohammed 12/05/14 showed Hg=7.6 & he was Tx 2u w/ Hg improved to 10.9 by 12/11/14...  We reviewed prob list, meds, xrays and labs>   CXR 12/05/14 showed norm heart size, diffuse interstitial coarsening & nodularity w/ scarring, s/p right lung surg, etc...  LABS 6/16: reviewed in Epic:  Chems- ok, BS=119, Alb=3.4;  CBC- Hg=10.9 after 2u PCs 12/05/14 for Hg=7.6  PLAN>>  We discussed optimizing his bronchodil, anti-inflamm, & mucolytic rx w/ DUONEB QID regularly, PRED taper (see AVS), and MUCINEX 6076mid +fluids; he will also take the [place] regularly and extra Ativan 0.31m30mrn;  He will f/u w/ me in 3-4 wks...     PCP= Dr. Bo Georga Borast Medical History  Diagnosis Date  . Arthritis   . Wheezing   . Sore throat   . Carotid artery occlusion   . COPD (chronic obstructive pulmonary disease)   . Lumbar disc disease   . Restless leg syndrome   . Allergic rhinitis   . Hypertension   . Anxiety     anxiety  . Shortness of breath   . Lung mass   . GERD (gastroesophageal reflux disease)   . H/O hiatal hernia   . Pre-operative cardiovascular examination   . Nonspecific abnormal unspecified cardiovascular function study   . Peripheral vascular disease, unspecified   . Pneumonia 2014    ?   . OMarland Kitchen home oxygen therapy     prn  . Constipation   . Cancer 09/14/12    invasive mod diff squamous cell ca  . S/P radiation therapy 09/25/14    brain mets 20Gy    Past Surgical  History  Procedure Laterality Date  . Carotid endarterectomy  10/15/2010    left  . Hand surgery      repair of the left long, ring, and small finger after a saw injury  . Video bronchoscopy  07/24/2012    Procedure: VIDEO BRONCHOSCOPY WITH FLUORO;  Surgeon: KeiKathee DeltonD;  [facility];  Service: Cardiopulmonary;  Laterality: Bilateral;  . Lobectomy Right 09/14/2012    Procedure: LOBECTOMY;  Surgeon: PetIvin PootD;  [facility];  Laterality: Right;  RIGHT UPPER LOBECTOMY  . Video assisted thoracoscopy Right 09/14/2012    Procedure: VIDEO ASSISTED THORACOSCOPY;  Surgeon: PetIvin PootD;  [facility];  Laterality: Right;  . Video bronchoscopy with endobronchial ultrasound N/A 08/27/2014    Procedure: VIDEO BRONCHOSCOPY WITH ENDOBRONCHIAL ULTRASOUND;  Surgeon:  Ivin Poot, MD;  Location: [facility] OR;  Service: Thoracic;  Laterality: N/A;  . Scalene node biopsy Right 08/27/2014    Procedure: BIOPSY SCALENE NODE;  Surgeon: Ivin Poot, MD;  Location: [facility] OR;  Service: Thoracic;  Laterality: Right;  . Sterotactic radiosurgery Right 09/25/14    right parietal 30Gy/1 fx    Outpatient Encounter Prescriptions as of 12/18/2014  Medication Sig  . albuterol (PROVENTIL) (2.5 MG/3ML) 0.083% nebulizer solution Take 2.5 mg by nebulization every 6 (six) hours as needed for wheezing or shortness of breath.  Marland Kitchen amLODipine (NORVASC) 10 MG tablet Take 1 tablet (10 mg total) by mouth daily.  Marland Kitchen aspirin EC 81 MG tablet Take 81 mg by mouth daily.   . clonazePAM (KLONOPIN) 0.5 MG tablet Take 0.5 mg by mouth 2 (two) times daily as needed for anxiety. Takes one tablet by mouth two times daily as needed for anxiety.  Marland Kitchen HYDROcodone-acetaminophen (NORCO) 7.5-325 MG per tablet Take 1 tablet by mouth every 6 (six) hours as needed for moderate pain.  Marland Kitchen LORazepam (ATIVAN) 0.5 MG tablet Take 1 tablet by mouth or sublingually every 8 hours as needed for nausea. May  take 1 tablet by mouth at bedtime.  . metoprolol tartrate (LOPRESSOR) 25 MG tablet Take 25 mg by mouth daily.  Marland Kitchen omeprazole (PRILOSEC) 40 MG capsule Take 40 mg by mouth daily.  . pramipexole (MIRAPEX) 1 MG tablet Take 1 mg by mouth at bedtime.  . traMADol (ULTRAM) 50 MG tablet Take 1 tablet (50 mg total) by mouth every 12 (twelve) hours as needed.  . zolpidem (AMBIEN) 10 MG tablet Take 10 mg by mouth at bedtime as needed for sleep.    No Known Allergies   Current Medications, Allergies, Past Medical History, Past Surgical History, Family History, and Social History were reviewed in [employer] record.   Review of Systems  Constitutional: Positive for fatigue. Negative for fever and unexpected weight change.  HENT: Positive for congestion. Negative for dental problem, ear pain, nosebleeds, postnasal drip, rhinorrhea, sinus pressure, sneezing, sore throat and trouble swallowing.   Eyes: Negative for redness and itching.  Respiratory: Positive for cough, chest tightness, shortness of breath and wheezing.   Cardiovascular: Negative for palpitations and leg swelling.  Gastrointestinal: Negative for nausea and vomiting.  Genitourinary: Negative for dysuria.  Musculoskeletal: Negative for joint swelling.  Skin: Negative for rash.  Neurological: Positive for dizziness, weakness and headaches. Negative for seizures and syncope.  Hematological: Positive for adenopathy. Does not bruise/bleed easily.  Psychiatric/Behavioral: Positive for sleep disturbance and dysphoric mood. The patient is nervous/anxious.       Objective:   Physical Exam  Vital Signs:  Reviewed...  General:  WD, WN, 74 y/o WM chr ill appearing, in NAD; alert & oriented; pleasant & cooperative... HEENT:  [place]/AT; Conjunctiva- pink, Sclera- nonicteric, EOM-wnl, PERRLA, EACs-clear, TMs-wnl; NOSE-clear; THROAT-clear & wnl. Neck:  Supple w/ fair ROM; no JVD; s/p LCAE, faint R bruit; no thyromegaly or nodules  palpated; + right supraclav lymphadenopathy. Chest:  Diffuse bilat rhonchi & wheezing w/o consolidation Heart:  Regular Rhythm; Gr1/6 SEM, w/o rubs or gallops detected. Abdomen:  Soft & nontender- no guarding or rebound; normal bowel sounds; no organomegaly or masses palpated. Ext:  Normal ROM; without deformities, mild arthritic changes; no varicose veins, venous insuffic, or edema;  Pulses intact w/o bruits. Neuro:  No focal neuro deficits, gait normal & balance OK. Derm:  No lesions noted; no rash etc. Lymph:  + right  supraclavicular node palpated.     Assessment & Plan:   COPD, quit smoking 09/2014> on O2 at 2-3L/min, NEBS w/ Albut prn; he is very congested w/ bilat rhonchi & wheezing; we discussed Rx w/ DUONEB Qid, MUCINEX Qid + fluids, and Depo80+PRED taper...  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  HBP & ASHD> followed by [facility], his notes reviewed, on ECASA81, Metop25/d, Amlod10; BP= 150/80 and he knows that he can take an extra Metop25 daily if needed for BP...  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  GERD> on Protionix40  RLS> on Mirapex 67m Qhs...  Anxiety/ insomnia> on Klonopin0.5Bid, Ativan0.5 prn, Ambien10 prn...  Anemia> Labs by DrMohammed 12/05/14 showed Hg=7.6 & he was Tx 2u w/ Hg improved to 10.9 by 12/11/14...    Patient's Medications  New Prescriptions   IPRATROPIUM (ATROVENT) 0.02 % NEBULIZER SOLUTION    Take 2.5 mLs (0.5 mg total) by nebulization 4 (four) times daily.   IPRATROPIUM-ALBUTEROL (DUONEB) 0.5-2.5 (3) MG/3ML SOLN    Take 3 mLs by nebulization 4 (four) times daily.   PREDNISONE (DELTASONE) 20 MG TABLET    Use as directed by physician  Previous Medications   ALBUTEROL (PROVENTIL) (2.5 MG/3ML) 0.083% NEBULIZER  SOLUTION    Take 2.5 mg by nebulization every 6 (six) hours as needed for wheezing or shortness of breath.   AMLODIPINE (NORVASC) 10 MG TABLET    Take 1 tablet (10 mg total) by mouth daily.   ASPIRIN EC 81 MG TABLET    Take 81 mg by mouth daily.    CLONAZEPAM (KLONOPIN) 0.5 MG TABLET    Take 0.5 mg by mouth 2 (two) times daily as needed for anxiety. Takes one tablet by mouth two times daily as needed for anxiety.   HYDROCODONE-ACETAMINOPHEN (NORCO) 7.5-325 MG PER TABLET    Take 1 tablet by mouth every 6 (six) hours as needed for moderate pain.   LORAZEPAM (ATIVAN) 0.5 MG TABLET    Take 1 tablet by mouth or sublingually every 8 hours as needed for nausea. May take 1 tablet by mouth at bedtime.   METOPROLOL TARTRATE (LOPRESSOR) 25 MG TABLET    Take 25 mg by mouth daily.   OMEPRAZOLE (PRILOSEC) 40 MG CAPSULE    Take 40 mg by mouth daily.   PRAMIPEXOLE (MIRAPEX) 1 MG TABLET    Take 1 mg by mouth at bedtime.   TRAMADOL (ULTRAM) 50 MG TABLET    Take 1 tablet (50 mg total) by mouth every 12 (twelve) hours as needed.   ZOLPIDEM (AMBIEN) 10 MG TABLET    Take 10 mg by mouth at bedtime as needed for sleep.  Modified Medications   No medications on file  Discontinued Medications   No medications on file

## 2014-12-18 NOTE — Patient Instructions (Signed)
Today we updated your med list in our EPIC system...    Continue your current medications the same...  We decided to give you a Depo shot today and a tapering course of Prednisone '20mg'$  tabs..    Start w/ one tab twice daily for 5 days...    Then decrease to 1 tab each AM for 5 days...    Then decrease to 1/2 tab each AM for 5 days...    Then decrease to 1/2 tab every other day (1/2, 0, 1/2, 0, etc) til gone...   We wrote for a 30d supply of the Ipratropium for NEBULIZER (use this w/ the ALBUTEROL you already have- one of each 4 times daily til gone...    Then switch to the combination DUONEB medication 4 times daily...  You should also benefit from the [place] '600mg'$  one tab 4 times daily w/ extra fluids...  Let's plan a follow up visit in 90mo sooner if needed for problems..Marland KitchenMarland Kitchen

## 2014-12-18 NOTE — Patient Instructions (Signed)
[facility] Discharge Instructions for Patients Receiving Chemotherapy  Today you received the following chemotherapy agents: Nivolumab  To help prevent nausea and vomiting after your treatment, we encourage you to take your nausea medication as prescribed.   If you develop nausea and vomiting that is not controlled by your nausea medication, call the clinic.   BELOW ARE SYMPTOMS THAT SHOULD BE REPORTED IMMEDIATELY:  *FEVER GREATER THAN 100.5 F  *CHILLS WITH OR WITHOUT FEVER  NAUSEA AND VOMITING THAT IS NOT CONTROLLED WITH YOUR NAUSEA MEDICATION  *UNUSUAL SHORTNESS OF BREATH  *UNUSUAL BRUISING OR BLEEDING  TENDERNESS IN MOUTH AND THROAT WITH OR WITHOUT PRESENCE OF ULCERS  *URINARY PROBLEMS  *BOWEL PROBLEMS  UNUSUAL RASH Items with * indicate a potential emergency and should be followed up as soon as possible.  Feel free to call the clinic you have any questions or concerns. The clinic phone number is [phone].  Please show the CHEMO ALERT CARD at check-in to the Emergency Department and triage nurse.      Nivolumab injection What is this medicine? NIVOLUMAB (nye VOL ue mab) is used to treat certain types of melanoma and lung cancer. This medicine may be used for other purposes; ask your health care provider or pharmacist if you have questions. COMMON BRAND NAME(S): Opdivo What should I tell my health care provider before I take this medicine? They need to know if you have any of these conditions: -eye disease, vision problems -history of pancreatitis -immune system problems -inflammatory bowel disease -kidney disease -liver disease -lung disease -lupus -myasthenia gravis -multiple sclerosis -organ transplant -stomach or intestine problems -thyroid disease -tingling of the fingers or toes, or other nerve disorder -an unusual or allergic reaction to nivolumab, other medicines, foods, dyes, or preservatives -pregnant or trying to get  pregnant -breast-feeding How should I use this medicine? This medicine is for infusion into a vein. It is given by a health care professional in a hospital or clinic setting. A special MedGuide will be given to you before each treatment. Be sure to read this information carefully each time. Talk to your pediatrician regarding the use of this medicine in children. Special care may be needed. Overdosage: If you think you've taken too much of this medicine contact a poison control center or emergency room at once. Overdosage: If you think you have taken too much of this medicine contact a poison control center or emergency room at once. NOTE: This medicine is only for you. Do not share this medicine with others. What if I miss a dose? It is important not to miss your dose. Call your doctor or health care professional if you are unable to keep an appointment. What may interact with this medicine? Interactions have not been studied. This list may not describe all possible interactions. Give your health care provider a list of all the medicines, herbs, non-prescription drugs, or dietary supplements you use. Also tell them if you smoke, drink alcohol, or use illegal drugs. Some items may interact with your medicine. What should I watch for while using this medicine? Tell your doctor or healthcare professional if your symptoms do not start to get better or if they get worse. Your condition will be monitored carefully while you are receiving this medicine. You may need blood work done while you are taking this medicine. What side effects may I notice from receiving this medicine? Side effects that you should report to your doctor or health care professional as soon as possible: -  allergic reactions like skin rash, itching or hives, swelling of the face, lips, or tongue -black, tarry stools -bloody or watery diarrhea -changes in vision -chills -cough -depressed mood -eye pain -feeling  anxious -fever -general ill feeling or flu-like symptoms -hair loss -loss of appetite -low blood counts - this medicine may decrease the number of white blood cells, red blood cells and platelets. You may be at increased risk for infections and bleeding -pain, tingling, numbness in the hands or feet -redness, blistering, peeling or loosening of the skin, including inside the mouth -red pinpoint spots on skin -signs of decreased platelets or bleeding - bruising, pinpoint red spots on the skin, black, tarry stools, blood in the urine -signs of decreased red blood cells - unusually weak or tired, feeling faint or lightheaded, falls -signs of infection - fever or chills, cough, sore throat, pain or trouble passing urine -signs and symptoms of a dangerous change in heartbeat or heart rhythm like chest pain; dizziness; fast or irregular heartbeat; palpitations; feeling faint or lightheaded, falls; breathing problems -signs and symptoms of high blood sugar such as dizziness; dry mouth; dry skin; fruity breath; nausea; stomach pain; increased hunger or thirst; increased urination -signs and symptoms of kidney injury like trouble passing urine or change in the amount of urine -signs and symptoms of liver injury like dark yellow or brown urine; general ill feeling or flu-like symptoms; light-colored stools; loss of appetite; nausea; right upper belly pain; unusually weak or tired; yellowing of the eyes or skin -signs and symptoms of increased potassium like muscle weakness; chest pain; or fast, irregular heartbeat -signs and symptoms of low potassium like muscle cramps or muscle pain; chest pain; dizziness; feeling faint or lightheaded, falls; palpitations; breathing problems; or fast, irregular heartbeat -swelling of the ankles, feet, hands -weight gainSide effects that usually do not require medical attention (report to your doctor or health care professional if they continue or are  bothersome): -constipation -general ill feeling or flu-like symptoms -hair loss -loss of appetite -nausea, vomiting This list may not describe all possible side effects. Call your doctor for medical advice about side effects. You may report side effects to FDA at 1-800-FDA-1088. Where should I keep my medicine? This drug is given in a hospital or clinic and will not be stored at home. NOTE: This sheet is a summary. It may not cover all possible information. If you have questions about this medicine, talk to your doctor, pharmacist, or health care provider.  2015, Elsevier/Gold Standard. (2013-09-17 13:18:19)  

## 2014-12-23 ENCOUNTER — Encounter: Payer: Self-pay | Admitting: Radiation Oncology

## 2014-12-23 ENCOUNTER — Telehealth: Payer: Self-pay | Admitting: Medical Oncology

## 2014-12-23 NOTE — Telephone Encounter (Signed)
He tolerated the nivolumab well. He continues to  "vomit and has been for a while" .   He says the vomiting is because the mucous is thick and gets caught  in his throat and he chokes and "vomits." he has an appetite. Saw Dr Lenna Gilford and is taking Mucinex, duoneb and pred taper. I encouraged pt to increase fluids with mucinex and robitussin.[name] stated the robitussin DM helps control the cough  more than anything. I told him  don't take it with mucinex . I instructed him to call for any fruther problems

## 2014-12-26 ENCOUNTER — Telehealth: Payer: Self-pay | Admitting: *Deleted

## 2014-12-26 ENCOUNTER — Telehealth: Payer: Self-pay | Admitting: Medical Oncology

## 2014-12-26 NOTE — Telephone Encounter (Signed)
Wife upset about [employer] service and feels like pt is not getting what he needs for his oxygen tanks.I called and left message for [place] staff to call back.

## 2014-12-26 NOTE — Telephone Encounter (Signed)
[name] confused about appt tomorrow for MRI and f/u with [facility].  Call made to [place] who will call wife.

## 2014-12-26 NOTE — Telephone Encounter (Signed)
Wife called to see if Abelina Bachelor RN had heard anything from [place].  I told wife she had called 1.5hrs ago and left message for [employer] to call her back.  Wife said she was also going to call them.

## 2014-12-27 ENCOUNTER — Other Ambulatory Visit: Payer: Medicare Other

## 2014-12-27 NOTE — Addendum Note (Signed)
Encounter addended by: Kyung Rudd, MD on: 12/27/2014  3:16 PM<BR>     Documentation filed: Notes Section

## 2014-12-30 ENCOUNTER — Telehealth: Payer: Self-pay | Admitting: *Deleted

## 2014-12-30 ENCOUNTER — Encounter: Payer: Self-pay | Admitting: Nurse Practitioner

## 2014-12-30 ENCOUNTER — Ambulatory Visit (HOSPITAL_COMMUNITY)
Admission: RE | Admit: 2014-12-30 | Discharge: 2014-12-30 | Disposition: A | Discharge status: Home / Self Care | Payer: Medicare Other | Source: Ambulatory Visit | Attending: Nurse Practitioner | Admitting: Nurse Practitioner

## 2014-12-30 ENCOUNTER — Ambulatory Visit (HOSPITAL_BASED_OUTPATIENT_CLINIC_OR_DEPARTMENT_OTHER): Payer: Medicare Other | Admitting: Nurse Practitioner

## 2014-12-30 ENCOUNTER — Encounter: Payer: Self-pay | Admitting: Radiation Oncology

## 2014-12-30 ENCOUNTER — Ambulatory Visit (HOSPITAL_COMMUNITY)
Admission: RE | Admit: 2014-12-30 | Discharge: 2014-12-30 | Disposition: A | Discharge status: Home / Self Care | Payer: Medicare Other | Source: Ambulatory Visit | Attending: Radiation Oncology | Admitting: Radiation Oncology

## 2014-12-30 ENCOUNTER — Ambulatory Visit: Payer: Self-pay | Admitting: Radiation Oncology

## 2014-12-30 VITALS — BP 129/79 | HR 115 | Temp 98.3°F | Resp 22 | Ht 69.5 in | Wt 169.0 lb

## 2014-12-30 DIAGNOSIS — R918 Other nonspecific abnormal finding of lung field: Secondary | ICD-10-CM | POA: Diagnosis not present

## 2014-12-30 DIAGNOSIS — C7800 Secondary malignant neoplasm of unspecified lung: Secondary | ICD-10-CM | POA: Diagnosis not present

## 2014-12-30 DIAGNOSIS — C3490 Malignant neoplasm of unspecified part of unspecified bronchus or lung: Secondary | ICD-10-CM

## 2014-12-30 DIAGNOSIS — C7931 Secondary malignant neoplasm of brain: Secondary | ICD-10-CM

## 2014-12-30 DIAGNOSIS — G893 Neoplasm related pain (acute) (chronic): Secondary | ICD-10-CM | POA: Insufficient documentation

## 2014-12-30 DIAGNOSIS — J984 Other disorders of lung: Secondary | ICD-10-CM | POA: Insufficient documentation

## 2014-12-30 DIAGNOSIS — R55 Syncope and collapse: Secondary | ICD-10-CM | POA: Diagnosis not present

## 2014-12-30 DIAGNOSIS — R21 Rash and other nonspecific skin eruption: Secondary | ICD-10-CM | POA: Diagnosis not present

## 2014-12-30 DIAGNOSIS — R0602 Shortness of breath: Secondary | ICD-10-CM

## 2014-12-30 DIAGNOSIS — C7970 Secondary malignant neoplasm of unspecified adrenal gland: Secondary | ICD-10-CM | POA: Diagnosis not present

## 2014-12-30 DIAGNOSIS — R05 Cough: Secondary | ICD-10-CM | POA: Diagnosis present

## 2014-12-30 NOTE — Assessment & Plan Note (Signed)
Patient received his last Nivolumab immunotherapy on 12/18/2014.  Patient does appear to have developed a scattered rash to his entire trunk area that is only occasionally pruritic.  Patient was advised to try Benadryl 25 mg every 6 hours and Pepcid 20 mg every 12 hours; as well as hydrocortisone cream for treatment of the rash.  Patient is scheduled to return this coming Wednesday, 01/01/2015 for labs, follow up visit, and his next cycle of immunotherapy.

## 2014-12-30 NOTE — Assessment & Plan Note (Signed)
Patient reports that he was in the midst of a coughing spasm this past week; and experienced a full syncopal episode.  He states that he hit the top of his head when he passed out.  He denies any neurological changes whatsoever at this time.  He denies any vision changes.  He denies any headaches.  On exam.-Patient has a healing abrasion to the top of the scalp only.  All vital signs essentially stable; with the exception of a heart rate up to 115 when standing or ambulating.

## 2014-12-30 NOTE — Assessment & Plan Note (Signed)
Patient received his last Nivolumab immunotherapy on 12/18/2014.  Patient does appear to have developed a scattered rash to his entire trunk area that is only occasionally pruritic.  Patient was advised to try Benadryl 25 mg every 6 hours and Pepcid 20 mg every 12 hours; as well as hydrocortisone cream for treatment of the rash.

## 2014-12-30 NOTE — Assessment & Plan Note (Addendum)
Patient continues to complain of some mild, chronic discomfort to his right upper chest area.  Patient states that he takes the tramadol and the hydrocodone as previously directed to manage his pain.  Review of patient's most recent scans reveal mild increased right supraclavicular lymph nodes.  Patient denies any actual chest pain, chest pressure, shortness breath, or pain with inspiration.

## 2014-12-30 NOTE — Progress Notes (Signed)
SYMPTOM MANAGEMENT CLINIC   HPI: [name] 74 y.o. male diagnosed with lung cancer; with metastasis to the brain, adrenal glands, and kidney. Currently undergoing Nivolumab immunotherapy.  Patient reports developing a rash within the past few days to his entire trunk area that is mildly pruritic.  He denies any other allergic-type symptoms whatsoever.  He reports that he was in the midst of a coughing spasm; and actually passed out, hitting his head within this past week.  He denies any other neurological changes whatsoever.  He states that his chronic pain to his right upper chest area is essentially managed with his previously prescribed pain medications.  He continues with home O2 on a 24/7 basis at between 2-4 L nasal cannula.  Patient denies any recent fevers or chills.   HPI  ROS  Past Medical History  Diagnosis Date  . Arthritis   . Wheezing   . Sore throat   . Carotid artery occlusion   . COPD (chronic obstructive pulmonary disease)   . Lumbar disc disease   . Restless leg syndrome   . Allergic rhinitis   . Hypertension   . Anxiety     anxiety  . Shortness of breath   . Lung mass   . GERD (gastroesophageal reflux disease)   . H/O hiatal hernia   . Pre-operative cardiovascular examination   . Nonspecific abnormal unspecified cardiovascular function study   . Peripheral vascular disease, unspecified   . Pneumonia 2014    ?   Marland Kitchen On home oxygen therapy     prn  . Constipation   . Cancer 09/14/12    invasive mod diff squamous cell ca  . 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 S/P radiation therapy 12/04/14 srs    lt frontal brain    Past Surgical History  Procedure Laterality Date  . Carotid endarterectomy  10/15/2010    left  . Hand surgery      repair of the left long, ring, and small finger after a saw injury  . Video bronchoscopy  07/24/2012    Procedure: VIDEO BRONCHOSCOPY WITH FLUORO;  Surgeon: Kathee Delton, MD;  Location: [facility];   Service: Cardiopulmonary;  Laterality: Bilateral;  . Lobectomy Right 09/14/2012    Procedure: LOBECTOMY;  Surgeon: Ivin Poot, MD;  Location: [place];  Service: Thoracic;  Laterality: Right;  RIGHT UPPER LOBECTOMY  . Video assisted thoracoscopy Right 09/14/2012    Procedure: VIDEO ASSISTED THORACOSCOPY;  Surgeon: Ivin Poot, MD;  Location: [place];  Service: Thoracic;  Laterality: Right;  . Video bronchoscopy with endobronchial ultrasound N/A 08/27/2014    Procedure: VIDEO BRONCHOSCOPY WITH ENDOBRONCHIAL ULTRASOUND;  Surgeon: Ivin Poot, MD;  Location: [facility] OR;  Service: Thoracic;  Laterality: N/A;  . Scalene node biopsy Right 08/27/2014    Procedure: BIOPSY SCALENE NODE;  Surgeon: Ivin Poot, MD;  Location: [facility] OR;  Service: Thoracic;  Laterality: Right;  . Sterotactic radiosurgery Right 09/25/14    right parietal 30Gy/1 fx    has Occlusion and stenosis of carotid artery without mention of cerebral infarction; COPD (chronic obstructive pulmonary disease); Lung mass; Pre-operative cardiovascular examination; Nonspecific abnormal unspecified cardiovascular function study; Peripheral vascular disease, unspecified; Lung cancer; Pain in limb; Coronary atherosclerosis of native coronary artery; Essential hypertension, benign; Tobacco use disorder; Carotid stenosis; Aftercare following surgery of the circulatory system; Weakness-Hand and  Legs; COPD exacerbation; Tachycardia; Dyspnea; Brain metastasis; Chemotherapy induced neutropenia; Renal stone; Pyelonephritis; Dehydration;  UTI (lower urinary tract infection); Left arm cellulitis; Blood poisoning; Sepsis; Encounter for antineoplastic immunotherapy; Cancer associated pain; Rash; and Syncope on his problem list.    has No Known Allergies.    Medication List       This list is accurate as of: 12/30/14  3:11 PM.  Always use your most recent med list.               albuterol (2.5 MG/3ML) 0.083% nebulizer solution  Commonly known as:   PROVENTIL  Take 2.5 mg by nebulization every 6 (six) hours as needed for wheezing or shortness of breath.     amLODipine 10 MG tablet  Commonly known as:  NORVASC  Take 1 tablet (10 mg total) by mouth daily.     aspirin EC 81 MG tablet  Take 81 mg by mouth daily.     clonazePAM 0.5 MG tablet  Commonly known as:  KLONOPIN  Take 0.5 mg by mouth 2 (two) times daily as needed for anxiety. Takes one tablet by mouth two times daily as needed for anxiety.     HYDROcodone-acetaminophen 7.5-325 MG per tablet  Commonly known as:  NORCO  Take 1 tablet by mouth every 6 (six) hours as needed for moderate pain.     ipratropium 0.02 % nebulizer solution  Commonly known as:  ATROVENT  Take 2.5 mLs (0.5 mg total) by nebulization 4 (four) times daily.     ipratropium-albuterol 0.5-2.5 (3) MG/3ML Soln  Commonly known as:  DUONEB  Take 3 mLs by nebulization 4 (four) times daily.     LORazepam 0.5 MG tablet  Commonly known as:  ATIVAN  Take 1 tablet by mouth or sublingually every 8 hours as needed for nausea. May take 1 tablet by mouth at bedtime.     metoprolol tartrate 25 MG tablet  Commonly known as:  LOPRESSOR  Take 25 mg by mouth 2 (two) times daily.     omeprazole 40 MG capsule  Commonly known as:  PRILOSEC  Take 40 mg by mouth daily.     pramipexole 1 MG tablet  Commonly known as:  MIRAPEX  Take 1 mg by mouth at bedtime.     predniSONE 20 MG tablet  Commonly known as:  DELTASONE  Use as directed by physician     traMADol 50 MG tablet  Commonly known as:  ULTRAM  Take 1 tablet (50 mg total) by mouth every 12 (twelve) hours as needed.     zolpidem 10 MG tablet  Commonly known as:  AMBIEN  Take 10 mg by mouth at bedtime as needed for sleep.         PHYSICAL EXAMINATION  Oncology Vitals 12/30/2014 12/30/2014 12/18/2014 12/18/2014 12/11/2014 12/06/2014 12/06/2014  Height - 177 cm - - - - -  Weight - 76.658 kg 76.204 kg - - - -  Weight (lbs) - 169 lbs 168 lbs - - - -  BMI (kg/m2) -  24.6 kg/m2 - - - - -  Temp - 98.3 97 98.3 97 98.4 98  Pulse 115 101 114 100 75 73 70  Resp - 22 - _0 SpO2 - 96 96 100 97 100 100  BSA (m2) - 1.94 m2 - - - - -   BP Readings from Last 3 Encounters:  12/30/14 129/79  12/18/14 150/80  12/18/14 116/74    Physical Exam  Constitutional: He is oriented to person, place, and time.  Patient appears fatigued, weak, frail, and  chronically ill.  HENT:  Head: Normocephalic.  Mouth/Throat: Oropharynx is clear and moist.  Top of scalp with healing abrasion.  No evidence of infection.  Eyes: Conjunctivae and EOM are normal. Pupils are equal, round, and reactive to light. Right eye exhibits no discharge. Left eye exhibits no discharge. No scleral icterus.  Neck: Normal range of motion. Neck supple. No JVD present. No tracheal deviation present. No thyromegaly present.  Cardiovascular: Intact distal pulses.   Tachycardic rate  Pulmonary/Chest: No stridor. No respiratory distress. He has no wheezes. He has no rales. He exhibits no tenderness.  Patient with decreased breath sounds bilaterally; and mild shortness of breath.  Occasional cough; but no wheezing.  Abdominal: Soft. Bowel sounds are normal. He exhibits no distension and no mass. There is no tenderness. There is no rebound and no guarding.  Musculoskeletal: Normal range of motion. He exhibits no edema or tenderness.  Lymphadenopathy:    He has no cervical adenopathy.  Neurological: He is alert and oriented to person, place, and time.  Skin: Skin is warm and dry. No rash noted. No erythema. There is pallor.  Psychiatric: Affect normal.  Nursing note and vitals reviewed.   LABORATORY DATA:. No visits with results within 3 Day(s) from this visit. Latest known visit with results is:  Appointment on 12/18/2014  Component Date Value Ref Range Status  . WBC 12/18/2014 4.2  4.0 - 10.3 10e3/uL Final  . NEUT# 12/18/2014 1.5  1.5 - 6.5 10e3/uL Final  . HGB 12/18/2014 11.8* 13.0 - 17.1  g/dL Final  . HCT 12/18/2014 34.7* 38.4 - 49.9 % Final  . Platelets 12/18/2014 556* 140 - 400 10e3/uL Final  . MCV 12/18/2014 92.5  79.3 - 98.0 fL Final  . MCH 12/18/2014 31.5  27.2 - 33.4 pg Final  . MCHC 12/18/2014 34.1  32.0 - 36.0 g/dL Final  . RBC 12/18/2014 3.75* 4.20 - 5.82 10e6/uL Final  . RDW 12/18/2014 17.7* 11.0 - 14.6 % Final  . lymph# 12/18/2014 1.5  0.9 - 3.3 10e3/uL Final  . MONO# 12/18/2014 1.0* 0.1 - 0.9 10e3/uL Final  . Eosinophils Absolute 12/18/2014 0.0  0.0 - 0.5 10e3/uL Final  . Basophils Absolute 12/18/2014 0.1  0.0 - 0.1 10e3/uL Final  . NEUT% 12/18/2014 36.3* 39.0 - 75.0 % Final  . LYMPH% 12/18/2014 36.0  14.0 - 49.0 % Final  . MONO% 12/18/2014 24.6* 0.0 - 14.0 % Final  . EOS% 12/18/2014 1.0  0.0 - 7.0 % Final  . BASO% 12/18/2014 2.1* 0.0 - 2.0 % Final  . TSH 12/18/2014 1.282  0.320 - 4.118 m(IU)/L Final  . Sodium 12/18/2014 138  136 - 145 mEq/L Final  . Potassium 12/18/2014 3.7  3.5 - 5.1 mEq/L Final  . Chloride 12/18/2014 98  98 - 109 mEq/L Final  . CO2 12/18/2014 30* 22 - 29 mEq/L Final  . Glucose 12/18/2014 102  70 - 140 mg/dl Final  . BUN 12/18/2014 7.0  7.0 - 26.0 mg/dL Final  . Creatinine 12/18/2014 0.8  0.7 - 1.3 mg/dL Final  . Total Bilirubin 12/18/2014 0.35  0.20 - 1.20 mg/dL Final  . Alkaline Phosphatase 12/18/2014 81  40 - 150 U/L Final  . AST 12/18/2014 19  5 - 34 U/L Final  . ALT 12/18/2014 12  0 - 55 U/L Final  . Total Protein 12/18/2014 7.3  6.4 - 8.3 g/dL Final  . Albumin 12/18/2014 3.7  3.5 - 5.0 g/dL Final  . Calcium 12/18/2014 9.7  8.4 -  10.4 mg/dL Final  . Anion Gap 12/18/2014 9  3 - 11 mEq/L Final  . EGFR 12/18/2014 >90  >90 ml/min/1.73 m2 Final   eGFR is calculated using the CKD-EPI Creatinine Equation (2009)     RADIOGRAPHIC STUDIES: Dg Chest 2 View  12/30/2014   CLINICAL DATA:  Chronic cough and increased shortness of breath. Lung cancer.  EXAM: CHEST  2 VIEW  COMPARISON:  CT scan dated 12/06/2014 and chest x-rays dated  12/05/2014 and 11/13/2014  FINDINGS: The patient has severe chronic interstitial and obstructive lung disease with scarring in both lungs which appears stable. Old right posterior lateral rib fractures.  Heart size is normal. Chronic apical pleural thickening. Surgical staples at the superior aspect of the right hilum.  No effusions.  No acute osseous abnormality.  IMPRESSION: No change since the prior exams. Extensive chronic lung disease with postsurgical changes and stable pulmonary nodules   Electronically Signed   By: Lorriane Shire M.D.   On: 12/30/2014 12:30    ASSESSMENT/PLAN:    Lung cancer Patient received his last Nivolumab immunotherapy on 12/18/2014.  Patient does appear to have developed a scattered rash to his entire trunk area that is only occasionally pruritic.  Patient was advised to try Benadryl 25 mg every 6 hours and Pepcid 20 mg every 12 hours; as well as hydrocortisone cream for treatment of the rash.  Patient is scheduled to return this coming Wednesday, 01/01/2015 for labs, follow up visit, and his next cycle of immunotherapy.  Cancer associated pain Patient continues to complain of some mild, chronic discomfort to his right upper chest area.  Patient states that he takes the tramadol and the hydrocodone as previously directed to manage his pain.  Review of patient's most recent scans reveal mild increased right supraclavicular lymph nodes.  Patient denies any actual chest pain, chest pressure, shortness breath, or pain with inspiration.  Rash Patient received his last Nivolumab immunotherapy on 12/18/2014.  Patient does appear to have developed a scattered rash to his entire trunk area that is only occasionally pruritic.  Patient was advised to try Benadryl 25 mg every 6 hours and Pepcid 20 mg every 12 hours; as well as hydrocortisone cream for treatment of the rash.    Syncope Patient reports that he was in the midst of a coughing spasm this past week; and  experienced a full syncopal episode.  He states that he hit the top of his head when he passed out.  He denies any neurological changes whatsoever at this time.  He denies any vision changes.  He denies any headaches.  On exam.-Patient has a healing abrasion to the top of the scalp only.  All vital signs essentially stable; with the exception of a heart rate up to 115 when standing or ambulating.  Patient stated understanding of all instructions; and was in agreement with this plan of care. The patient knows to call the clinic with any problems, questions or concerns.   This was a shared visit with [name] today.  Total time spent with patient was 25 minutes;  with greater than 75 percent of that time spent in face to face counseling regarding patient's symptoms,  and coordination of care and follow up.  Disclaimer: This note was dictated with voice recognition software. Similar sounding words can inadvertently be transcribed and may not be corrected upon review.   Drue Second, NP 12/30/2014   ADDENDUM: Hematology/Oncology Attending: I had a face to face encounter with the patient today.  I recommended his care plan. This is a very pleasant 74 years old white male with metastatic non-small cell lung cancer, squamous cell carcinoma was currently undergoing treatment with immunotherapy with Nivolumab status post 1 cycle. He tolerated the first cycle of his treatment fairly well with no significant adverse effects except for mild skin rash on the chest and back. The patient also continues to complain of the baseline shortness of breath increased with exertion. For the skin rash, I recommended for him to use Benadryl and also hydrocortisone cream to the rash area. For the shortness of breath, we will order chest x-ray to rule out any other abnormality. The patient would come back for follow-up visit as previously scheduled for reevaluation before starting cycle #2. He was advised to call  immediately if he has any concerning symptoms in the interval.  Disclaimer: This note was dictated with voice recognition software. Similar sounding words can inadvertently be transcribed and may not be corrected upon review.  Eilleen Kempf., MD 12/30/2014

## 2014-12-30 NOTE — Progress Notes (Addendum)
Follow up s/p SRS Brain 12/04/14, no c/o head ache, no nausea, coughing up clear to brackish colored phelgm ,  Took ortho vitals sitting b/p=153/73,P=101,RR=22,T=98.3 Standing b/p=129/79,P=115,RR=22 Patient fell either last Thursday or Friday,  Sitting on front porch couch, bent over to cough hard and fell forward, bruising on b/l arms, top of =head has an abrasion size of a nickel,  Stated he passed out for a very short time, placed yellow fall risk braceleet on patient left arm, 96% 2 liters n/c  appetite some days good some days not, fatigeued 10:59 AM BP 129/79 mmHg  Pulse 115  Temp(Src) 98.3 F (36.8 C) (Oral)  Resp 22  Ht 5' 9.5" (1.765 m)  Wt 169 lb (76.658 kg)  BMI 24.61 kg/m2  SpO2 96%  Wt Readings from Last 3 Encounters:  12/30/14 169 lb (76.658 kg)  12/18/14 168 lb (76.204 kg)  11/27/14 193 lb 9.6 oz (87.816 kg)

## 2014-12-30 NOTE — Telephone Encounter (Signed)
TC from pt's wife this am stating that her husband is on Nivolumab and has developed a rash on his chest and has dizziness over the weekend to the point of passing out and falling. Did not sustain any injuries. Denies fevers, diarrhea, vomiting.   Patient has appt with Dr. Lisbeth Renshaw today @ 10:45. He would like to be seen in [facility] after that appt. Will send pof for [facility] and [name], NP at 11:15 am.

## 2014-12-30 NOTE — Progress Notes (Signed)
Radiation Oncology         [phone] ________________________________  Name: [name] MRN: [id number]  Date: 12/30/2014  DOB: 04/27/1941  Follow-Up Visit Note  CC: [name], MD  [name], MD  Diagnosis:   Metastatic Lung Cancer  Interval Since Last Radiation:  1 month   Narrative:  The patient returns today for routine follow-up.  Follow up s/p SRS Brain 12/04/14, no c/o head ache, no nausea, coughing up clear to brackish colored phelgm , Took ortho vitals sitting b/p=153/73,P=101,RR=22,T=98.3  Standing b/p=129/79,P=115,RR=22  Patient fell either last Thursday or Friday, Sitting on front porch couch, bent over to cough hard and fell forward, bruising on b/l arms, top of =head has an abrasion size of a nickel, Stated he passed out for a very shhort time, places yellow fall risk braceleet on patient left arm, 96% 2 liters n/c  appetite some days good some days not, fatigued.                                ALLERGIES:  has No Known Allergies.  Meds: Current Outpatient Prescriptions  Medication Sig Dispense Refill  . albuterol (PROVENTIL) (2.5 MG/3ML) 0.083% nebulizer solution Take 2.5 mg by nebulization every 6 (six) hours as needed for wheezing or shortness of breath.    Marland Kitchen amLODipine (NORVASC) 10 MG tablet Take 1 tablet (10 mg total) by mouth daily. 90 tablet 3  . aspirin EC 81 MG tablet Take 81 mg by mouth daily.     . clonazePAM (KLONOPIN) 0.5 MG tablet Take 0.5 mg by mouth 2 (two) times daily as needed for anxiety. Takes one tablet by mouth two times daily as needed for anxiety.    Marland Kitchen HYDROcodone-acetaminophen (NORCO) 7.5-325 MG per tablet Take 1 tablet by mouth every 6 (six) hours as needed for moderate pain. 30 tablet 0  . ipratropium (ATROVENT) 0.02 % nebulizer solution Take 2.5 mLs (0.5 mg total) by nebulization 4 (four) times daily. 120 mL 0  . ipratropium-albuterol (DUONEB) 0.5-2.5 (3) MG/3ML SOLN Take 3 mLs by nebulization 4 (four) times daily. 360 mL 2  .  LORazepam (ATIVAN) 0.5 MG tablet Take 1 tablet by mouth or sublingually every 8 hours as needed for nausea. May take 1 tablet by mouth at bedtime. 40 tablet 0  . metoprolol tartrate (LOPRESSOR) 25 MG tablet Take 25 mg by mouth daily.    Marland Kitchen omeprazole (PRILOSEC) 40 MG capsule Take 40 mg by mouth daily.    . pramipexole (MIRAPEX) 1 MG tablet Take 1 mg by mouth at bedtime.    . predniSONE (DELTASONE) 20 MG tablet Use as directed by physician 20 tablet 0  . traMADol (ULTRAM) 50 MG tablet Take 1 tablet (50 mg total) by mouth every 12 (twelve) hours as needed. 30 tablet 0  . zolpidem (AMBIEN) 10 MG tablet Take 10 mg by mouth at bedtime as needed for sleep.     No current facility-administered medications for this encounter.    Physical Findings: The patient is in no acute distress. Patient is alert and oriented.  height is 5' 9.5" (1.765 m) and weight is 169 lb (76.658 kg). His oral temperature is 98.3 F (36.8 C). His blood pressure is 129/79 and his pulse is 115. His respiration is 22 and oxygen saturation is 96%. .     Lab Findings: Lab Results  Component Value Date   WBC 4.2 12/18/2014   HGB  11.8* 12/18/2014   HCT 34.7* 12/18/2014   MCV 92.5 12/18/2014   PLT 556* 12/18/2014     Radiographic Findings: Ct Chest W Contrast  12/06/2014   CLINICAL DATA:  Followup lung cancer.  Brain metastasis.  EXAM: CT CHEST, ABDOMEN, AND PELVIS WITH CONTRAST  TECHNIQUE: Multidetector CT imaging of the chest, abdomen and pelvis was performed following the standard protocol during bolus administration of intravenous contrast.  CONTRAST:  159m OMNIPAQUE IOHEXOL 300 MG/ML  SOLN  COMPARISON:  PET-CT 08/10/2014.  FINDINGS: CT CHEST FINDINGS  Mediastinum: The heart size appears normal. Aortic atherosclerosis identified. There is multi vessel coronary artery calcification. The trachea appears patent. Normal appearance of the esophagus. Index right supraclavicular lymph node measures 1.6 cm, image number 3/series 2.  Previously 1.1 cm. The index right retropectoral lymph node measures 8 mm, image 10/series 2. Previously 6 mm. Index right axillary lymph node measures 1.2 cm, image number 15/series 2. Previously this measured the same. Index sub- carinal lymph node appears improved measuring 6 mm, image 27/ series 2. Previously 13 mm.  Lungs/Pleura: Again noted is mild pleural thickening overlying both lungs. Innumerable small pulmonary nodules [place] are not throughout both lungs bilaterally. Index pulmonary nodule within the posterior right upper lung measures 0.8 cm, image 44/series 5. Previously 1.1 cm. Index peripheral right midlung nodule measures 9 mm, image 33/ series 5. Previously 1.2 cm. Index posterior right lower lobe nodule measures 7 mm, image 42/ series 5. Previously 9 mm.  Musculoskeletal: No or aggressive lytic or sclerotic bone lesions identified.  CT ABDOMEN AND PELVIS FINDINGS  Hepatobiliary: There is no suspicious liver abnormality identified. The gallbladder is normal. No biliary dilatation.  Pancreas: Unremarkable appearance of the pancreas.  Spleen: The spleen is normal.  Adrenals/Urinary Tract: Unremarkable appearance of the left adrenal gland. Nodule in the right adrenal gland measures 1.8 by 1.1 cm, image 57/series 2. Previously 1.4 x 0.8 cm. Focal area of low attenuation involving the right kidney measures 2.7 cm, image 65/series 2. Suspicious for kidney metastasis. In retrospect this was present on previous PET scan. Normal appearance of the left kidney. Urinary bladder appears normal.  Stomach/Bowel: The stomach is within normal limits. The small bowel loops have a normal course and caliber. No obstruction. Normal appearance of the colon.  Vascular/Lymphatic: Calcified atherosclerotic disease involves the abdominal aorta. No aneurysm. The infrarenal abdominal aorta measures 2.4 cm in maximum AP dimension. No enlarged retroperitoneal or mesenteric adenopathy. No enlarged pelvic or inguinal lymph nodes.   Reproductive: Prostate gland and seminal vesicles appear normal.  Other: There is no ascites or focal fluid collections within the abdomen or pelvis.  Musculoskeletal: Extensive spondylosis identified within the lumbar spine. Anterolisthesis of L5 on S1 is noted. 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  IMPRESSION: 1. Mixed interval response to therapy. The pulmonary nodules are stable to decreased in size from previous exam. The sub- carinal lymph node is also decreased in size in the interval. 2. Mild increase in size of right supraclavicular and right retropectoral lymph nodes. Stable appearance of right axillary node. 3. 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 4. 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 5. Aortic atherosclerosis. Multi vessel Coronary artery calcification also noted.   Electronically Signed   By: TKerby MoorsM.D.   On: 12/06/2014 19:56   Ct Abdomen Pelvis W Contrast  12/06/2014   CLINICAL DATA:  Followup lung cancer.  Brain metastasis.  EXAM: CT CHEST, ABDOMEN, AND PELVIS WITH CONTRAST  TECHNIQUE: Multidetector CT imaging of the chest, abdomen and pelvis was performed following the standard protocol during bolus administration of intravenous contrast.  CONTRAST:  176m OMNIPAQUE IOHEXOL 300 MG/ML  SOLN  COMPARISON:  PET-CT 08/10/2014.  FINDINGS: CT CHEST FINDINGS  Mediastinum: The heart size appears normal. Aortic atherosclerosis identified. There is multi vessel coronary artery calcification. The trachea appears patent. Normal appearance of the esophagus. Index right supraclavicular lymph node measures 1.6 cm, image number 3/series 2. Previously 1.1 cm. The index right retropectoral lymph node measures 8 mm, image 10/series 2. Previously 6 mm. Index right axillary lymph node measures 1.2 cm, image number 15/series 2. Previously this measured the same. Index sub- carinal lymph node appears improved measuring 6 mm, image 27/ series 2. Previously 13 mm.  Lungs/Pleura: Again  noted is mild pleural thickening overlying both lungs. Innumerable small pulmonary nodules [place] are not throughout both lungs bilaterally. Index pulmonary nodule within the posterior right upper lung measures 0.8 cm, image 44/series 5. Previously 1.1 cm. Index peripheral right midlung nodule measures 9 mm, image 33/ series 5. Previously 1.2 cm. Index posterior right lower lobe nodule measures 7 mm, image 42/ series 5. Previously 9 mm.  Musculoskeletal: No or aggressive lytic or sclerotic bone lesions identified.  CT ABDOMEN AND PELVIS FINDINGS  Hepatobiliary: There is no suspicious liver abnormality identified. The gallbladder is normal. No biliary dilatation.  Pancreas: Unremarkable appearance of the pancreas.  Spleen: The spleen is normal.  Adrenals/Urinary Tract: Unremarkable appearance of the left adrenal gland. Nodule in the right adrenal gland measures 1.8 by 1.1 cm, image 57/series 2. Previously 1.4 x 0.8 cm. Focal area of low attenuation involving the right kidney measures 2.7 cm, image 65/series 2. Suspicious for kidney metastasis. In retrospect this was present on previous PET scan. Normal appearance of the left kidney. Urinary bladder appears normal.  Stomach/Bowel: The stomach is within normal limits. The small bowel loops have a normal course and caliber. No obstruction. Normal appearance of the colon.  Vascular/Lymphatic: Calcified atherosclerotic disease involves the abdominal aorta. No aneurysm. The infrarenal abdominal aorta measures 2.4 cm in maximum AP dimension. No enlarged retroperitoneal or mesenteric adenopathy. No enlarged pelvic or inguinal lymph nodes.  Reproductive: Prostate gland and seminal vesicles appear normal.  Other: There is no ascites or focal fluid collections within the abdomen or pelvis.  Musculoskeletal: Extensive spondylosis identified within the lumbar spine. Anterolisthesis of L5 on S1 is noted. 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  IMPRESSION: 1. Mixed interval response  to therapy. The pulmonary nodules are stable to decreased in size from previous exam. The sub- carinal lymph node is also decreased in size in the interval. 2. Mild increase in size of right supraclavicular and right retropectoral lymph nodes. Stable appearance of right axillary node. 3. 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 4. 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 5. Aortic atherosclerosis. Multi vessel Coronary artery calcification also noted.   Electronically Signed   By: TKerby MoorsM.D.   On: 12/06/2014 19:56   Dg Chest Portable 1 View  12/05/2014   CLINICAL DATA:  Productive cough with bloody sputum. History of lung cancer with metastases  EXAM: PORTABLE CHEST - 1 VIEW  COMPARISON:  11/13/2014  FINDINGS: Normal heart size and stable mediastinal contours, distorted on the right from previous surgery.  There is diffuse interstitial coarsening, recently evaluated by chest CT 10/07/2014. No gross alveolar hemorrhage, pneumonia, effusion, or air leak.  IMPRESSION: 1. No change from  11/13/2014 to explain new hemoptysis. 2. Lung cancer with post treatment changes on the right, most recently staged 10/07/2014.   Electronically Signed   By: Monte Fantasia M.D.   On: 12/05/2014 06:24    Impression: Patient tolerated treatment well. Seeing symptom management clinic this am - describes some dizziness on current chemo.  Plan:  Follow up MRI in 2 months.    Jodelle Gross, M.D., Ph.D.     This document serves as a record of services personally performed by Kyung Rudd, MD. It was created on his behalf by Derek Mound, a trained medical scribe. The creation of this record is based on the scribe's personal observations and the provider's statements to them. This document has been checked and approved by the attending provider.

## 2014-12-30 NOTE — Addendum Note (Signed)
Encounter addended by: Doreen Beam, RN on: 12/30/2014  1:23 PM<BR>     Documentation filed: Notes Section

## 2014-12-31 ENCOUNTER — Telehealth: Payer: Self-pay

## 2014-12-31 NOTE — Telephone Encounter (Signed)
Called to follow up with pt after [facility] visit yesterday 6/21. Pt complained of rash and SOB. Pt states he is feeling pretty well today, the cream and benadryl seem to be helping. pt still has SOB, pt has follow up appt with Adrena tomorrow 6/22 along with treatment. Pt confirmed and denies any questions or concerns at this time.

## 2015-01-01 ENCOUNTER — Ambulatory Visit (HOSPITAL_BASED_OUTPATIENT_CLINIC_OR_DEPARTMENT_OTHER): Payer: Medicare Other

## 2015-01-01 ENCOUNTER — Ambulatory Visit (HOSPITAL_BASED_OUTPATIENT_CLINIC_OR_DEPARTMENT_OTHER): Payer: Medicare Other | Admitting: Physician Assistant

## 2015-01-01 ENCOUNTER — Telehealth: Payer: Self-pay | Admitting: Physician Assistant

## 2015-01-01 ENCOUNTER — Telehealth: Payer: Self-pay | Admitting: *Deleted

## 2015-01-01 ENCOUNTER — Other Ambulatory Visit: Payer: Self-pay | Admitting: *Deleted

## 2015-01-01 ENCOUNTER — Other Ambulatory Visit (HOSPITAL_BASED_OUTPATIENT_CLINIC_OR_DEPARTMENT_OTHER): Payer: Medicare Other

## 2015-01-01 ENCOUNTER — Encounter: Payer: Self-pay | Admitting: Physician Assistant

## 2015-01-01 VITALS — BP 163/71 | HR 93 | Temp 98.7°F | Resp 23 | Ht 69.5 in | Wt 169.3 lb

## 2015-01-01 DIAGNOSIS — E876 Hypokalemia: Secondary | ICD-10-CM | POA: Diagnosis not present

## 2015-01-01 DIAGNOSIS — R21 Rash and other nonspecific skin eruption: Secondary | ICD-10-CM

## 2015-01-01 DIAGNOSIS — Z5112 Encounter for antineoplastic immunotherapy: Secondary | ICD-10-CM

## 2015-01-01 DIAGNOSIS — C3490 Malignant neoplasm of unspecified part of unspecified bronchus or lung: Secondary | ICD-10-CM

## 2015-01-01 DIAGNOSIS — C3411 Malignant neoplasm of upper lobe, right bronchus or lung: Secondary | ICD-10-CM | POA: Diagnosis present

## 2015-01-01 DIAGNOSIS — C7971 Secondary malignant neoplasm of right adrenal gland: Secondary | ICD-10-CM

## 2015-01-01 DIAGNOSIS — C7931 Secondary malignant neoplasm of brain: Secondary | ICD-10-CM

## 2015-01-01 LAB — CBC WITH DIFFERENTIAL/PLATELET
BASO%: 0.5 % (ref 0.0–2.0)
Basophils Absolute: 0 10*3/uL (ref 0.0–0.1)
EOS%: 0.4 % (ref 0.0–7.0)
Eosinophils Absolute: 0 10*3/uL (ref 0.0–0.5)
HCT: 34.6 % — ABNORMAL LOW (ref 38.4–49.9)
HGB: 11.4 g/dL — ABNORMAL LOW (ref 13.0–17.1)
LYMPH%: 24.2 % (ref 14.0–49.0)
MCH: 30.7 pg (ref 27.2–33.4)
MCHC: 33 g/dL (ref 32.0–36.0)
MCV: 93 fL (ref 79.3–98.0)
MONO#: 0.8 10*3/uL (ref 0.1–0.9)
MONO%: 8.3 % (ref 0.0–14.0)
NEUT#: 6.3 10*3/uL (ref 1.5–6.5)
NEUT%: 66.6 % (ref 39.0–75.0)
PLATELETS: 218 10*3/uL (ref 140–400)
RBC: 3.72 10*6/uL — ABNORMAL LOW (ref 4.20–5.82)
RDW: 17.7 % — ABNORMAL HIGH (ref 11.0–14.6)
WBC: 9.4 10*3/uL (ref 4.0–10.3)
lymph#: 2.3 10*3/uL (ref 0.9–3.3)

## 2015-01-01 LAB — COMPREHENSIVE METABOLIC PANEL (CC13)
ALT: 16 U/L (ref 0–55)
ANION GAP: 9 meq/L (ref 3–11)
AST: 14 U/L (ref 5–34)
Albumin: 3.5 g/dL (ref 3.5–5.0)
Alkaline Phosphatase: 66 U/L (ref 40–150)
BILIRUBIN TOTAL: 0.27 mg/dL (ref 0.20–1.20)
BUN: 8.5 mg/dL (ref 7.0–26.0)
CO2: 30 mEq/L — ABNORMAL HIGH (ref 22–29)
CREATININE: 0.8 mg/dL (ref 0.7–1.3)
Calcium: 9.2 mg/dL (ref 8.4–10.4)
Chloride: 97 mEq/L — ABNORMAL LOW (ref 98–109)
EGFR: 89 mL/min/{1.73_m2} — AB (ref >90)
GLUCOSE: 140 mg/dL (ref 70–140)
Potassium: 3 mEq/L — CL (ref 3.5–5.1)
SODIUM: 136 meq/L (ref 136–145)
TOTAL PROTEIN: 6.8 g/dL (ref 6.4–8.3)

## 2015-01-01 MED ORDER — SODIUM CHLORIDE 0.9 % IV SOLN
3.0000 mg/kg | Freq: Once | INTRAVENOUS | Status: AC
  Administered 2015-01-01: 260 mg via INTRAVENOUS
  Filled 2015-01-01: qty 26

## 2015-01-01 MED ORDER — SODIUM CHLORIDE 0.9 % IV SOLN
Freq: Once | INTRAVENOUS | Status: AC
  Administered 2015-01-01: 16:00:00 via INTRAVENOUS

## 2015-01-01 MED ORDER — POTASSIUM CHLORIDE CRYS ER 20 MEQ PO TBCR: 20.0000 meq | EXTENDED_RELEASE_TABLET | Freq: Two times a day (BID) | ORAL | Status: DC

## 2015-01-01 NOTE — Telephone Encounter (Addendum)
Pt wife in waiting room request to speak with nurse.  Wife requesting [facility] change the Oxygen provider as she does not want  explosives in her house. [facility] to [facility] spoke with Shanon Brow who advised pt to call [facility] to discuss concerns and type of oxygen system pt has in home. If pt still feels she would like to change oxygen provider an order is needed from MD's office with pt oxygen levels and needs. Call to pt wifes lmovm with the above information and [facility] phone number.

## 2015-01-01 NOTE — Telephone Encounter (Signed)
-----   Message from Ardeen Garland, RN sent at 01/01/2015  3:18 PM EDT ----- Wife approached me in chemo.  Wants to switch from [facility] to [facility] oxygen. . That is okay per medicare per wife, They are not happy with service and do not provide what he needs . Marland Kitchen Please refer to [facility] and have them call wife.  He wants a portable and a concentrator that refills. Wants tanks that can be refilled.

## 2015-01-01 NOTE — Telephone Encounter (Signed)
Rx for K+ 20mq 1PO BID sent to pt pharmacy. Pt notified.

## 2015-01-01 NOTE — Telephone Encounter (Signed)
Per staff message and POF I have scheduled appts. Advised scheduler of appts. JMW  

## 2015-01-01 NOTE — Patient Instructions (Addendum)
Nivolumab injection What is this medicine? NIVOLUMAB (nye VOL ue mab) is used to treat certain types of melanoma and lung cancer. This medicine may be used for other purposes; ask your health care provider or pharmacist if you have questions. COMMON BRAND NAME(S): Opdivo What should I tell my health care provider before I take this medicine? They need to know if you have any of these conditions: -eye disease, vision problems -history of pancreatitis -immune system problems -inflammatory bowel disease -kidney disease -liver disease -lung disease -lupus -myasthenia gravis -multiple sclerosis -organ transplant -stomach or intestine problems -thyroid disease -tingling of the fingers or toes, or other nerve disorder -an unusual or allergic reaction to nivolumab, other medicines, foods, dyes, or preservatives -pregnant or trying to get pregnant -breast-feeding How should I use this medicine? This medicine is for infusion into a vein. It is given by a health care professional in a hospital or clinic setting. A special MedGuide will be given to you before each treatment. Be sure to read this information carefully each time. Talk to your pediatrician regarding the use of this medicine in children. Special care may be needed. Overdosage: If you think you've taken too much of this medicine contact a poison control center or emergency room at once. Overdosage: If you think you have taken too much of this medicine contact a poison control center or emergency room at once. NOTE: This medicine is only for you. Do not share this medicine with others. What if I miss a dose? It is important not to miss your dose. Call your doctor or health care professional if you are unable to keep an appointment. What may interact with this medicine? Interactions have not been studied. This list may not describe all possible interactions. Give your health care provider a list of all the medicines, herbs,  non-prescription drugs, or dietary supplements you use. Also tell them if you smoke, drink alcohol, or use illegal drugs. Some items may interact with your medicine. What should I watch for while using this medicine? Tell your doctor or healthcare professional if your symptoms do not start to get better or if they get worse. Your condition will be monitored carefully while you are receiving this medicine. You may need blood work done while you are taking this medicine. What side effects may I notice from receiving this medicine? Side effects that you should report to your doctor or health care professional as soon as possible: -allergic reactions like skin rash, itching or hives, swelling of the face, lips, or tongue -black, tarry stools -bloody or watery diarrhea -changes in vision -chills -cough -depressed mood -eye pain -feeling anxious -fever -general ill feeling or flu-like symptoms -hair loss -loss of appetite -low blood counts - this medicine may decrease the number of white blood cells, red blood cells and platelets. You may be at increased risk for infections and bleeding -pain, tingling, numbness in the hands or feet -redness, blistering, peeling or loosening of the skin, including inside the mouth -red pinpoint spots on skin -signs of decreased platelets or bleeding - bruising, pinpoint red spots on the skin, black, tarry stools, blood in the urine -signs of decreased red blood cells - unusually weak or tired, feeling faint or lightheaded, falls -signs of infection - fever or chills, cough, sore throat, pain or trouble passing urine -signs and symptoms of a dangerous change in heartbeat or heart rhythm like chest pain; dizziness; fast or irregular heartbeat; palpitations; feeling faint or lightheaded, falls; breathing problems -signs   and symptoms of high blood sugar such as dizziness; dry mouth; dry skin; fruity breath; nausea; stomach pain; increased hunger or thirst; increased  urination -signs and symptoms of kidney injury like trouble passing urine or change in the amount of urine -signs and symptoms of liver injury like dark yellow or brown urine; general ill feeling or flu-like symptoms; light-colored stools; loss of appetite; nausea; right upper belly pain; unusually weak or tired; yellowing of the eyes or skin -signs and symptoms of increased potassium like muscle weakness; chest pain; or fast, irregular heartbeat -signs and symptoms of low potassium like muscle cramps or muscle pain; chest pain; dizziness; feeling faint or lightheaded, falls; palpitations; breathing problems; or fast, irregular heartbeat -swelling of the ankles, feet, hands -weight gainSide effects that usually do not require medical attention (report to your doctor or health care professional if they continue or are bothersome): -constipation -general ill feeling or flu-like symptoms -hair loss -loss of appetite -nausea, vomiting This list may not describe all possible side effects. Call your doctor for medical advice about side effects. You may report side effects to FDA at 1-800-FDA-1088. Where should I keep my medicine? This drug is given in a hospital or clinic and will not be stored at home. NOTE: This sheet is a summary. It may not cover all possible information. If you have questions about this medicine, talk to your doctor, pharmacist, or health care provider.  2015, [employer]vier/Gold Standard. (2013-09-17 13:18:19)   Patient instructed to stop at his pharmacy and pick up prescription, per Awilda Metro PA

## 2015-01-01 NOTE — Progress Notes (Addendum)
[place] [phone]   [phone]  OFFICE PROGRESS NOTE  [name], MD [address] [place] [address]  DIAGNOSIS: Metastatic non-small cell lung cancer, squamous cell carcinoma initially diagnosed as Stage IIA (T2b., N0, M0) non-small cell lung cancer, squamous cell carcinoma diagnosed in December of 2013   PRIOR THERAPY:  1) Status post right upper lobectomy under the care of Dr. Prescott Gum on 09/14/2012.  2) Adjuvant chemotherapy with cisplatin 75 mg/M2 and docetaxel 75 mg/M2 with Neulasta support every 3 weeks, status post 1 cycle. Starting with cycle #2, patient will receive carboplatin for an AUC initially of 4.5 and paclitaxel at 175 mg per meter squared with Neulasta support given every 3 weeks. Status post a total of 3 cycles. 3) Systemic chemotherapy with carboplatin for AUC of 5 on day 1 and gemcitabine 1000 MG/M2 on days 1 and 8 every 3 weeks. First dose 09/11/2014. Status post 4 cycles  CURRENT THERAPY: Immunotherapy with Nivolumab 3 mg/kg given every 2 weeks. First cycle given 12/18/2014. Status post 1 cycle  CHEMOTHERAPY INTENT: Palliative. CURRENT # OF CHEMOTHERAPY CYCLES: 2 CURRENT ANTIEMETICS: Zofran, dexamethasone and Compazine  CURRENT SMOKING STATUS: Quit smoking recently.  ORAL CHEMOTHERAPY AND CONSENT: None  CURRENT BISPHOSPHONATES USE: None  PAIN MANAGEMENT: 3/10 controlled by Norco when necessary  NARCOTICS INDUCED CONSTIPATION: Milk of magnesia on as-needed basis.  LIVING WILL AND CODE STATUS: No CODE BLUE.   INTERVAL HISTORY:  [name] 74 y.o. male returns to the clinic today for followup visit accompanied his wife. He is currently being treated with immunotherapy in the form of Nivolumab.he is status post 1 cycle. He did develop a rash on his anterior chest and upper back and shoulder region that is about the same. It is not terribly curetted. He was prescribed a cortisone cream however has  not started using the cream. He continues to complain of having no energy, feeling short of breath and having no appetite. He continues to complain of increased shortness of breath and is currently on 2-3 L of oxygen via nasal cannula on a daily basis.  He denied having any significant chest pain but has mild cough and no hemoptysis.  He denied having any significant nausea or vomiting, no fever or chills, no night sweats. He denied any diarrhea.  MEDICAL HISTORY: Past Medical History  Diagnosis Date  . Arthritis   . Wheezing   . Sore throat   . Carotid artery occlusion   . COPD (chronic obstructive pulmonary disease)   . Lumbar disc disease   . Restless leg syndrome   . Allergic rhinitis   . Hypertension   . Anxiety     anxiety  . Shortness of breath   . Lung mass   . GERD (gastroesophageal reflux disease)   . H/O hiatal hernia   . Pre-operative cardiovascular examination   . Nonspecific abnormal unspecified cardiovascular function study   . Peripheral vascular disease, unspecified   . Pneumonia 2014    ?   Marland Kitchen On home oxygen therapy     prn  . Constipation   . Cancer 09/14/12    invasive mod diff squamous cell ca  . 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 S/P radiation therapy 12/04/14 srs    lt frontal brain    ALLERGIES:  has No Known Allergies.  MEDICATIONS:  Current Outpatient Prescriptions  Medication Sig Dispense Refill  .  albuterol (PROVENTIL) (2.5 MG/3ML) 0.083% nebulizer solution Take 2.5 mg by nebulization every 6 (six) hours as needed for wheezing or shortness of breath.    Marland Kitchen amLODipine (NORVASC) 10 MG tablet Take 1 tablet (10 mg total) by mouth daily. 90 tablet 3  . aspirin EC 81 MG tablet Take 81 mg by mouth daily.     . clonazePAM (KLONOPIN) 0.5 MG tablet Take 0.5 mg by mouth 2 (two) times daily as needed for anxiety. Takes one tablet by mouth two times daily as needed for anxiety.    Marland Kitchen HYDROcodone-acetaminophen (NORCO) 7.5-325 MG per tablet Take 1  tablet by mouth every 6 (six) hours as needed for moderate pain. 30 tablet 0  . ipratropium (ATROVENT) 0.02 % nebulizer solution Take 2.5 mLs (0.5 mg total) by nebulization 4 (four) times daily. 120 mL 0  . ipratropium-albuterol (DUONEB) 0.5-2.5 (3) MG/3ML SOLN Take 3 mLs by nebulization 4 (four) times daily. 360 mL 2  . LORazepam (ATIVAN) 0.5 MG tablet Take 1 tablet by mouth or sublingually every 8 hours as needed for nausea. May take 1 tablet by mouth at bedtime. 40 tablet 0  . metoprolol tartrate (LOPRESSOR) 25 MG tablet Take 25 mg by mouth 2 (two) times daily.     Marland Kitchen omeprazole (PRILOSEC) 40 MG capsule Take 40 mg by mouth daily.    . potassium chloride SA (K-DUR,KLOR-CON) 20 MEQ tablet Take 1 tablet (20 mEq total) by mouth 2 (two) times daily. 14 tablet 0  . pramipexole (MIRAPEX) 1 MG tablet Take 1 mg by mouth at bedtime.    . predniSONE (DELTASONE) 20 MG tablet Use as directed by physician 20 tablet 0  . traMADol (ULTRAM) 50 MG tablet Take 1 tablet (50 mg total) by mouth every 12 (twelve) hours as needed. 30 tablet 0  . zolpidem (AMBIEN) 10 MG tablet Take 10 mg by mouth at bedtime as needed for sleep.     No current facility-administered medications for this visit.    SURGICAL HISTORY:  Past Surgical History  Procedure Laterality Date  . Carotid endarterectomy  10/15/2010    left  . Hand surgery      repair of the left long, ring, and small finger after a saw injury  . Video bronchoscopy  07/24/2012    Procedure: VIDEO BRONCHOSCOPY WITH FLUORO;  Surgeon: Kathee Delton, MD;  Location: [facility];  Service: Cardiopulmonary;  Laterality: Bilateral;  . Lobectomy Right 09/14/2012    Procedure: LOBECTOMY;  Surgeon: Ivin Poot, MD;  Location: [facility];  Service: Thoracic;  Laterality: Right;  RIGHT UPPER LOBECTOMY  . Video assisted thoracoscopy Right 09/14/2012    Procedure: VIDEO ASSISTED THORACOSCOPY;  Surgeon: Ivin Poot, MD;  Location: [facility];  Service: Thoracic;  Laterality: Right;  .  Video bronchoscopy with endobronchial ultrasound N/A 08/27/2014    Procedure: VIDEO BRONCHOSCOPY WITH ENDOBRONCHIAL ULTRASOUND;  Surgeon: Ivin Poot, MD;  Location: [facility];  Service: Thoracic;  Laterality: N/A;  . Scalene node biopsy Right 08/27/2014    Procedure: BIOPSY SCALENE NODE;  Surgeon: Ivin Poot, MD;  Location: [facility];  Service: Thoracic;  Laterality: Right;  . Sterotactic radiosurgery Right 09/25/14    right parietal 30Gy/1 fx    REVIEW OF SYSTEMS:  Constitutional: positive for anorexia, fatigue, malaise and weight loss Eyes: negative Ears, nose, mouth, throat, and face: negative Respiratory: positive for cough, dyspnea on exertion and wheezing Cardiovascular: negative Gastrointestinal: negative Genitourinary:negative Integument/breast: positive for rash Hematologic/lymphatic: negative Musculoskeletal:negative Neurological: negative  Behavioral/Psych: negative Endocrine: negative Allergic/Immunologic: negative   PHYSICAL EXAMINATION: General appearance: alert, cooperative, fatigued and no distress Head: Normocephalic, without obvious abnormality, atraumatic Neck: no adenopathy, no JVD, supple, symmetrical, trachea midline and thyroid not enlarged, symmetric, no tenderness/mass/nodules Lymph nodes: Cervical, supraclavicular, and axillary nodes normal. Resp: wheezes bilaterally Back: symmetric, no curvature. ROM normal. No CVA tenderness. Cardio: regular rate and rhythm, S1, S2 normal, no murmur, click, rub or gallop GI: soft, non-tender; bowel sounds normal; no masses,  no organomegaly Extremities: extremities normal, atraumatic, no cyanosis or edema Neurologic: Alert and oriented X 3, normal strength and tone. Normal symmetric reflexes. Normal coordination and gait Skin: erythematous papular rash on the anterior chest and upper back. No evidence of super- infection  ECOG PERFORMANCE STATUS: 1 - Symptomatic but completely ambulatory  Blood pressure 163/71, pulse 93,  temperature 98.7 F (37.1 C), temperature source Oral, resp. rate 23, height 5' 9.5" (1.765 m), weight 169 lb 4.8 oz (76.794 kg), SpO2 95 %.  LABORATORY DATA: Lab Results  Component Value Date   WBC 9.4 01/01/2015   HGB 11.4* 01/01/2015   HCT 34.6* 01/01/2015   MCV 93.0 01/01/2015   PLT 218 01/01/2015      Chemistry      Component Value Date/Time   NA 136 01/01/2015 1406   NA 138 12/05/2014 0618   K 3.0* 01/01/2015 1406   K 3.4* 12/05/2014 0618   CL 95* 12/05/2014 0618   CL 101 12/28/2012 0852   CO2 30* 01/01/2015 1406   CO2 32 12/05/2014 0618   BUN 8.5 01/01/2015 1406   BUN 9 12/05/2014 0618   CREATININE 0.8 01/01/2015 1406   CREATININE 0.52* 12/05/2014 0618      Component Value Date/Time   CALCIUM 9.2 01/01/2015 1406   CALCIUM 9.2 12/05/2014 0618   ALKPHOS 66 01/01/2015 1406   ALKPHOS 71 12/05/2014 0618   AST 14 01/01/2015 1406   AST 24 12/05/2014 0618   ALT 16 01/01/2015 1406   ALT 30 12/05/2014 0618   BILITOT 0.27 01/01/2015 1406   BILITOT 0.2* 12/05/2014 0618       RADIOGRAPHIC STUDIES: Dg Chest 2 View  12/30/2014   CLINICAL DATA:  Chronic cough and increased shortness of breath. Lung cancer.  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 Old right posterior lateral rib fractures.  Heart size is normal. Chronic apical pleural thickening. Surgical staples at the superior aspect of the right hilum.  No effusions.  No acute osseous abnormality.  IMPRESSION: No change since the prior exams. Extensive chronic lung disease with postsurgical changes and stable pulmonary nodules   Electronically Signed   By: Lorriane Shire M.D.   On: 12/30/2014 12:30   Ct Chest W Contrast  12/06/2014   CLINICAL DATA:  Followup lung cancer.  Brain metastasis.  EXAM: CT CHEST, ABDOMEN, AND PELVIS  WITH CONTRAST  TECHNIQUE: Multidetector CT imaging of the chest, abdomen and pelvis was performed following the standard protocol during bolus administration of intravenous contrast.  CONTRAST:  17m OMNIPAQUE IOHEXOL 300 MG/ML  SOLN  COMPARISON:  PET-CT 08/10/2014.  FINDINGS: CT CHEST FINDINGS  Mediastinum: The heart size appears normal. Aortic atherosclerosis identified. There is multi vessel coronary artery calcification. The trachea appears patent. Normal appearance of the esophagus. Index right supraclavicular lymph node measures 1.6 cm, image  number 3/series 2. Previously 1.1 cm. The index right retropectoral lymph node measures 8 mm, image 10/series 2. Previously 6 mm. Index right axillary lymph node measures 1.2 cm, image number 15/series 2. Previously this measured the same. Index sub- carinal lymph node appears improved measuring 6 mm, image 27/ series 2. Previously 13 mm.  Lungs/Pleura: Again noted is mild pleural thickening overlying both lungs. Innumerable small pulmonary nodules [place] CT are not throughout both lungs bilaterally. Index pulmonary nodule within the posterior right upper lung measures 0.8 cm, image 44/series 5. Previously 1.1 cm. Index peripheral right midlung nodule measures 9 mm, image 33/ series 5. Previously 1.2 cm. Index posterior right lower lobe nodule measures 7 mm, image 42/ series 5. Previously 9 mm.  Musculoskeletal: No or aggressive lytic or sclerotic bone lesions identified.  CT ABDOMEN AND PELVIS FINDINGS  Hepatobiliary: There is no suspicious liver abnormality identified. The gallbladder is normal. No biliary dilatation.  Pancreas: Unremarkable appearance of the pancreas.  Spleen: The spleen is normal.  Adrenals/Urinary Tract: Unremarkable appearance of the left adrenal gland. Nodule in the right adrenal gland measures 1.8 by 1.1 cm, image 57/series 2. Previously 1.4 x 0.8 cm. Focal area of low attenuation involving the right kidney measures 2.7 cm, image 65/series 2.  Suspicious for kidney metastasis. In retrospect this was present on previous PET scan. Normal appearance of the left kidney. Urinary bladder appears normal.  Stomach/Bowel: The stomach is within normal limits. The small bowel loops have a normal course and caliber. No obstruction. Normal appearance of the colon.  Vascular/Lymphatic: Calcified atherosclerotic disease involves the abdominal aorta. No aneurysm. The infrarenal abdominal aorta measures 2.4 cm in maximum AP dimension. No enlarged retroperitoneal or mesenteric adenopathy. No enlarged pelvic or inguinal lymph nodes.  Reproductive: Prostate gland and seminal vesicles appear normal.  Other: There is no ascites or focal fluid collections within the abdomen or pelvis.  Musculoskeletal: Extensive spondylosis identified within the lumbar spine. Anterolisthesis of L5 on S1 is noted. 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  IMPRESSION: 1. Mixed interval response to therapy. The pulmonary nodules are stable to decreased in size from previous exam. The sub- carinal lymph node is also decreased in size in the interval. 2. Mild increase in size of right supraclavicular and right retropectoral lymph nodes. Stable appearance of right axillary node. 3. 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 4. 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 5. Aortic atherosclerosis. Multi vessel Coronary artery calcification also noted.   Electronically Signed   By: Kerby Moors M.D.   On: 12/06/2014 19:56   Ct Abdomen Pelvis W Contrast  12/06/2014   CLINICAL DATA:  Followup lung cancer.  Brain metastasis.  EXAM: CT CHEST, ABDOMEN, AND PELVIS WITH CONTRAST  TECHNIQUE: Multidetector CT imaging of the chest, abdomen and pelvis was performed following the standard protocol during bolus administration of intravenous contrast.  CONTRAST:  165m OMNIPAQUE IOHEXOL 300 MG/ML  SOLN  COMPARISON:  PET-CT 08/10/2014.  FINDINGS: CT CHEST FINDINGS  Mediastinum: The heart size appears  normal. Aortic atherosclerosis identified. There is multi vessel coronary artery calcification. The trachea appears patent. Normal appearance of the esophagus. Index right supraclavicular lymph node measures 1.6 cm, image number 3/series 2. Previously 1.1 cm. The index right retropectoral lymph node measures 8 mm, image 10/series 2. Previously 6 mm. Index right axillary lymph node measures 1.2 cm, image number 15/series 2. Previously this measured the same. Index sub- carinal lymph node appears improved measuring 6 mm, image  27/ series 2. Previously 13 mm.  Lungs/Pleura: Again noted is mild pleural thickening overlying both lungs. Innumerable small pulmonary nodules [place] CT are not throughout both lungs bilaterally. Index pulmonary nodule within the posterior right upper lung measures 0.8 cm, image 44/series 5. Previously 1.1 cm. Index peripheral right midlung nodule measures 9 mm, image 33/ series 5. Previously 1.2 cm. Index posterior right lower lobe nodule measures 7 mm, image 42/ series 5. Previously 9 mm.  Musculoskeletal: No or aggressive lytic or sclerotic bone lesions identified.  CT ABDOMEN AND PELVIS FINDINGS  Hepatobiliary: There is no suspicious liver abnormality identified. The gallbladder is normal. No biliary dilatation.  Pancreas: Unremarkable appearance of the pancreas.  Spleen: The spleen is normal.  Adrenals/Urinary Tract: Unremarkable appearance of the left adrenal gland. Nodule in the right adrenal gland measures 1.8 by 1.1 cm, image 57/series 2. Previously 1.4 x 0.8 cm. Focal area of low attenuation involving the right kidney measures 2.7 cm, image 65/series 2. Suspicious for kidney metastasis. In retrospect this was present on previous PET scan. Normal appearance of the left kidney. Urinary bladder appears normal.  Stomach/Bowel: The stomach is within normal limits. The small bowel loops have a normal course and caliber. No obstruction. Normal appearance of the colon.  Vascular/Lymphatic:  Calcified atherosclerotic disease involves the abdominal aorta. No aneurysm. The infrarenal abdominal aorta measures 2.4 cm in maximum AP dimension. No enlarged retroperitoneal or mesenteric adenopathy. No enlarged pelvic or inguinal lymph nodes.  Reproductive: Prostate gland and seminal vesicles appear normal.  Other: There is no ascites or focal fluid collections within the abdomen or pelvis.  Musculoskeletal: Extensive spondylosis identified within the lumbar spine. Anterolisthesis of L5 on S1 is noted. 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  IMPRESSION: 1. Mixed interval response to therapy. The pulmonary nodules are stable to decreased in size from previous exam. The sub- carinal lymph node is also decreased in size in the interval. 2. Mild increase in size of right supraclavicular and right retropectoral lymph nodes. Stable appearance of right axillary node. 3. 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 4. 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 5. Aortic atherosclerosis. Multi vessel Coronary artery calcification also noted.   Electronically Signed   By: Kerby Moors M.D.   On: 12/06/2014 19:56   Dg Chest Portable 1 View  12/05/2014   CLINICAL DATA:  Productive cough with bloody sputum. History of lung cancer with metastases  EXAM: PORTABLE CHEST - 1 VIEW  COMPARISON:  11/13/2014  FINDINGS: Normal heart size and stable mediastinal contours, distorted on the right from previous surgery.  There is diffuse interstitial coarsening, recently evaluated by chest CT 10/07/2014. No gross alveolar hemorrhage, pneumonia, effusion, or air leak.  IMPRESSION: 1. No change from 11/13/2014 to explain new hemoptysis. 2. Lung cancer with post treatment changes on the right, most recently staged 10/07/2014.   Electronically Signed   By: Monte Fantasia M.D.   On: 12/05/2014 06:24    ASSESSMENT AND PLAN: This is a very pleasant 74 years old white male with history of stage IIa non-small cell lung cancer  status post right upper lobectomy followed by 4 cycles of adjuvant chemotherapy.  He now presented with evidence for significant disease recurrence including lymphangitic spread of the tumor in addition to bilateral hilar and mediastinal lymphadenopathy in addition to right axillary or right subpectoral and right supraclavicular lymphadenopathy and right sided adrenal metastasis. He also has a very small brain lesion measuring 2 mm. The patient  is status post 4 cycles of systemic chemotherapy with carboplatin and gemcitabine. He  toleraed the treatment well except for increased shortness of breath and fatigue.  Patient was discussed with and also seen by [name]. He is currently being treated with immunotherapy in the form of Nivolumab 3 mg/kg given every 2 weeks. Status post 1 cycles. He developed a rash and was prescribed a cortisone cream. Patient was advised to begin using this as prescribed for his rash. The rash is not terribly symptomatic or troublesome. We'll continue to monitor this very closely. He'll proceed with cycle #2 of immunotherapy today as scheduled. He is hypokalemic potassium 3.0. Prescription for potassium chloride 40 mEq by mouth daily for the next 7 days was sent to his pharmacy of record via E scribe. He will follow up in 2 weeks for a symptom management visit prior to the start of cycle #3.  Patient, his wife were in agreement with this plan.   He was advised to call immediately if he has any concerning symptoms in the interval.  The patient voices understanding of current disease status and treatment options and is in agreement with the current care plan.  All questions were answered. The patient knows to call the clinic with any problems, questions or concerns. We can certainly see the patient much sooner if necessary.  Carlton Adam, PA-C 01/01/2015  ADDENDUM: Hematology/Oncology Attending: I had a face to face encounter with the patient. I recommended her care  plan. This is a pleasant 74 years old white male with metastatic non-small cell lung cancer, squamous cell carcinoma was currently undergoing treatment with immunotherapy with Nivolumab status post 1 cycle. The patient related the first cycle of his treatment fairly well with no significant adverse effects except for the baseline shortness of breath and fatigue. I recommended for him to proceed with cycle #2 today as a scheduled. The patient would come back for follow-up visit in 2 weeks for reevaluation with the start of cycle #3. For the hypokalemia, we will start the patient on potassium chloride. He was advised to call immediately if he has any concerning symptoms in the interval.  Disclaimer: This note was dictated with voice recognition software. Similar sounding words can inadvertently be transcribed and may not be corrected upon review. Eilleen Kempf., MD 01/06/2015

## 2015-01-01 NOTE — Telephone Encounter (Signed)
per pof to sch pt appt-per Melissa she discuused w/Adrena-sent MW email to sch [id number]-trmt sch and pt to get updated copy in infusion

## 2015-01-01 NOTE — Telephone Encounter (Signed)
per pof to sch pt appt-sent staff message to [facility] to adv no openings-adv to reply with time & date available-adv pt will call after reply

## 2015-01-05 NOTE — Patient Instructions (Signed)
Use the hydrocortisone cream as prescribed for your rash Follow-up in 2 weeks prior to next scheduled cycle of immunotherapy

## 2015-01-06 ENCOUNTER — Other Ambulatory Visit: Payer: Self-pay | Admitting: Medical Oncology

## 2015-01-06 ENCOUNTER — Telehealth: Payer: Self-pay | Admitting: *Deleted

## 2015-01-06 NOTE — Telephone Encounter (Signed)
Wife [name] called and left message re:  Pt would like to change provider for home oxygen use from [place] to [facility].   [name] stated  [facility] will need a new script sent to [facility] for home oxygen. [name]  Phone     [phone].

## 2015-01-06 NOTE — Telephone Encounter (Signed)
I called [name] and offered to check oxygen sat % today , but she wants to wait until visit on July 6. I told her [facility] needs oxygen sat % testing do document qualifications while: 1. he is sitting at rest on room air   2, He is ambulating down the hall on room air and how many feet he walks and  3. He is walking down the hall ambulating on oxygen. This information then needs to be faxed to [facility] . I will route to mary G.

## 2015-01-10 ENCOUNTER — Ambulatory Visit: Payer: Medicare Other | Admitting: Pulmonary Disease

## 2015-01-15 ENCOUNTER — Other Ambulatory Visit (HOSPITAL_BASED_OUTPATIENT_CLINIC_OR_DEPARTMENT_OTHER): Payer: Medicare Other

## 2015-01-15 ENCOUNTER — Telehealth: Payer: Self-pay | Admitting: *Deleted

## 2015-01-15 ENCOUNTER — Ambulatory Visit (HOSPITAL_BASED_OUTPATIENT_CLINIC_OR_DEPARTMENT_OTHER): Payer: Medicare Other

## 2015-01-15 ENCOUNTER — Ambulatory Visit (HOSPITAL_BASED_OUTPATIENT_CLINIC_OR_DEPARTMENT_OTHER): Payer: Medicare Other | Admitting: Internal Medicine

## 2015-01-15 ENCOUNTER — Encounter: Payer: Self-pay | Admitting: Internal Medicine

## 2015-01-15 VITALS — BP 148/96 | HR 81 | Temp 99.4°F | Resp 18 | Ht 69.5 in | Wt 167.9 lb

## 2015-01-15 DIAGNOSIS — C3491 Malignant neoplasm of unspecified part of right bronchus or lung: Secondary | ICD-10-CM

## 2015-01-15 DIAGNOSIS — C3411 Malignant neoplasm of upper lobe, right bronchus or lung: Secondary | ICD-10-CM | POA: Diagnosis present

## 2015-01-15 DIAGNOSIS — C3490 Malignant neoplasm of unspecified part of unspecified bronchus or lung: Secondary | ICD-10-CM

## 2015-01-15 DIAGNOSIS — Z5112 Encounter for antineoplastic immunotherapy: Secondary | ICD-10-CM

## 2015-01-15 LAB — COMPREHENSIVE METABOLIC PANEL (CC13)
ALT: 16 U/L (ref 0–55)
AST: 16 U/L (ref 5–34)
Albumin: 3.4 g/dL — ABNORMAL LOW (ref 3.5–5.0)
Alkaline Phosphatase: 72 U/L (ref 40–150)
Anion Gap: 10 mEq/L (ref 3–11)
BUN: 10.1 mg/dL (ref 7.0–26.0)
CO2: 28 mEq/L (ref 22–29)
Calcium: 9.9 mg/dL (ref 8.4–10.4)
Chloride: 94 mEq/L — ABNORMAL LOW (ref 98–109)
Creatinine: 0.8 mg/dL (ref 0.7–1.3)
EGFR: >90 mL/min/{1.73_m2} (ref >90)
Glucose: 92 mg/dl (ref 70–140)
Potassium: 3.8 mEq/L (ref 3.5–5.1)
Sodium: 132 mEq/L — ABNORMAL LOW (ref 136–145)
Total Bilirubin: 0.33 mg/dL (ref 0.20–1.20)
Total Protein: 6.9 g/dL (ref 6.4–8.3)

## 2015-01-15 LAB — CBC WITH DIFFERENTIAL/PLATELET
BASO%: 0.9 % (ref 0.0–2.0)
Basophils Absolute: 0.1 10*3/uL (ref 0.0–0.1)
EOS%: 0.7 % (ref 0.0–7.0)
Eosinophils Absolute: 0.1 10*3/uL (ref 0.0–0.5)
HEMATOCRIT: 34.5 % — AB (ref 38.4–49.9)
HGB: 11.6 g/dL — ABNORMAL LOW (ref 13.0–17.1)
LYMPH%: 19.2 % (ref 14.0–49.0)
MCH: 31.4 pg (ref 27.2–33.4)
MCHC: 33.7 g/dL (ref 32.0–36.0)
MCV: 93.1 fL (ref 79.3–98.0)
MONO#: 0.9 10*3/uL (ref 0.1–0.9)
MONO%: 11 % (ref 0.0–14.0)
NEUT#: 5.4 10*3/uL (ref 1.5–6.5)
NEUT%: 68.2 % (ref 39.0–75.0)
Platelets: 327 10*3/uL (ref 140–400)
RBC: 3.71 10*6/uL — ABNORMAL LOW (ref 4.20–5.82)
RDW: 17 % — AB (ref 11.0–14.6)
WBC: 7.9 10*3/uL (ref 4.0–10.3)
lymph#: 1.5 10*3/uL (ref 0.9–3.3)

## 2015-01-15 MED ORDER — SODIUM CHLORIDE 0.9 % IV SOLN
240.0000 mg | Freq: Once | INTRAVENOUS | Status: AC
  Administered 2015-01-15: 240 mg via INTRAVENOUS
  Filled 2015-01-15: qty 24

## 2015-01-15 MED ORDER — SODIUM CHLORIDE 0.9 % IV SOLN
Freq: Once | INTRAVENOUS | Status: AC
  Administered 2015-01-15: 13:00:00 via INTRAVENOUS

## 2015-01-15 NOTE — Telephone Encounter (Signed)
-----   Message from Ardeen Garland, RN sent at 01/08/2015  7:28 AM EDT ----- Regarding: oxygen sat needed for [facility] Can you follow up with techs to get his O2 sats when he comes in on July 6th. He  will need to be retested for home oxygen since they are switching to [facility]. The worksheet andprescription is on the desk and a note needs to be put in the chart.  thanks Diane

## 2015-01-15 NOTE — Telephone Encounter (Signed)
Pt oxygen sats evaluated after MD appt, pt sats and new rx for oxygen therapy faxed to [facility].

## 2015-01-15 NOTE — Patient Instructions (Signed)
[facility] Discharge Instructions for Patients  Today you received the following: Nivolumab   To help prevent nausea and vomiting after your treatment, we encourage you to take your nausea medication as directed.    If you develop nausea and vomiting that is not controlled by your nausea medication, call the clinic.   BELOW ARE SYMPTOMS THAT SHOULD BE REPORTED IMMEDIATELY:  *FEVER GREATER THAN 100.5 F  *CHILLS WITH OR WITHOUT FEVER  NAUSEA AND VOMITING THAT IS NOT CONTROLLED WITH YOUR NAUSEA MEDICATION  *UNUSUAL SHORTNESS OF BREATH  *UNUSUAL BRUISING OR BLEEDING  TENDERNESS IN MOUTH AND THROAT WITH OR WITHOUT PRESENCE OF ULCERS  *URINARY PROBLEMS  *BOWEL PROBLEMS  UNUSUAL RASH Items with * indicate a potential emergency and should be followed up as soon as possible.  Feel free to call the clinic you have any questions or concerns. The clinic phone number is [phone].  Please show the CHEMO ALERT CARD at check-in to the Emergency Department and triage nurse.   

## 2015-01-15 NOTE — Telephone Encounter (Signed)
Per staff message and POF I have scheduled appts. Advised scheduler of appts. JMW  

## 2015-01-15 NOTE — Progress Notes (Signed)
[facility] [phone]   [phone]  OFFICE PROGRESS NOTE  Darren Miyamoto, MD [address] [place] [address]  DIAGNOSIS: Metastatic non-small cell lung cancer, squamous cell carcinoma initially diagnosed as Stage IIA (T2b., N0, M0) non-small cell lung cancer, squamous cell carcinoma diagnosed in December of 2013   PRIOR THERAPY:  1) Status post right upper lobectomy under the care of Dr. Prescott Gum on 09/14/2012.  2) Adjuvant chemotherapy with cisplatin 75 mg/M2 and docetaxel 75 mg/M2 with Neulasta support every 3 weeks, status post 1 cycle. Starting with cycle #2, patient will receive carboplatin for an AUC initially of 4.5 and paclitaxel at 175 mg per meter squared with Neulasta support given every 3 weeks. Status post a total of 3 cycles.  3) Systemic chemotherapy with carboplatin for AUC of 5 on day 1 and gemcitabine 1000 MG/M2 on days 1 and 8 every 3 weeks. First dose 09/11/2014. Status post 4 cycles. Discontinued secondary to intolerance  CURRENT THERAPY: Immunotherapy with Nivolumab 3 MG/KG every 2 weeks. Status post 2 cycles.  CHEMOTHERAPY INTENT: Palliative. CURRENT # OF CHEMOTHERAPY CYCLES: 3 CURRENT ANTIEMETICS: Zofran, dexamethasone and Compazine  CURRENT SMOKING STATUS: Quit smoking recently.  ORAL CHEMOTHERAPY AND CONSENT: None  CURRENT BISPHOSPHONATES USE: None  PAIN MANAGEMENT: 3/10 controlled by Norco when necessary  NARCOTICS INDUCED CONSTIPATION: Milk of magnesia on as-needed basis.  LIVING WILL AND CODE STATUS: No CODE BLUE.   INTERVAL HISTORY:  [name] 74 y.o. male returns to the clinic today for followup visit accompanied his wife. He is currently on treatment with immunotherapy with Nivolumab and tolerating it fairly well. He has no complaints except except for fatigue as well as the baseline shortness of breath. He is currently on home oxygen. He denied having any significant chest pain but has mild  cough and no hemoptysis. He denied having any significant nausea or vomiting, no fever or chills, no weight loss or night sweats. He is here today to start cycle #3 of his immunotherapy.  MEDICAL HISTORY: Past Medical History  Diagnosis Date  . Arthritis   . Wheezing   . Sore throat   . Carotid artery occlusion   . COPD (chronic obstructive pulmonary disease)   . Lumbar disc disease   . Restless leg syndrome   . Allergic rhinitis   . Hypertension   . Anxiety     anxiety  . Shortness of breath   . Lung mass   . GERD (gastroesophageal reflux disease)   . H/O hiatal hernia   . Pre-operative cardiovascular examination   . Nonspecific abnormal unspecified cardiovascular function study   . Peripheral vascular disease, unspecified   . Pneumonia 2014    ?   Marland Kitchen On home oxygen therapy     prn  . Constipation   . Cancer 09/14/12    invasive mod diff squamous cell ca  . 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 S/P radiation therapy 12/04/14 srs    lt frontal brain    ALLERGIES:  has No Known Allergies.  MEDICATIONS:  Current Outpatient Prescriptions  Medication Sig Dispense Refill  . albuterol (PROVENTIL) (2.5 MG/3ML) 0.083% nebulizer solution Take 2.5 mg by nebulization every 6 (six) hours as needed for wheezing or shortness of breath.    Marland Kitchen amLODipine (NORVASC) 10 MG tablet Take 1 tablet (10 mg total) by mouth daily. 90 tablet 3  . aspirin EC 81  MG tablet Take 81 mg by mouth daily.     . clonazePAM (KLONOPIN) 0.5 MG tablet Take 0.5 mg by mouth 2 (two) times daily as needed for anxiety. Takes one tablet by mouth two times daily as needed for anxiety.    Marland Kitchen HYDROcodone-acetaminophen (NORCO) 7.5-325 MG per tablet Take 1 tablet by mouth every 6 (six) hours as needed for moderate pain. 30 tablet 0  . ipratropium (ATROVENT) 0.02 % nebulizer solution Take 2.5 mLs (0.5 mg total) by nebulization 4 (four) times daily. 120 mL 0  . ipratropium-albuterol (DUONEB) 0.5-2.5 (3) MG/3ML SOLN  Take 3 mLs by nebulization 4 (four) times daily. 360 mL 2  . LORazepam (ATIVAN) 0.5 MG tablet Take 1 tablet by mouth or sublingually every 8 hours as needed for nausea. May take 1 tablet by mouth at bedtime. 40 tablet 0  . metoprolol tartrate (LOPRESSOR) 25 MG tablet Take 25 mg by mouth 2 (two) times daily.     Marland Kitchen omeprazole (PRILOSEC) 40 MG capsule Take 40 mg by mouth daily.    . potassium chloride SA (K-DUR,KLOR-CON) 20 MEQ tablet Take 1 tablet (20 mEq total) by mouth 2 (two) times daily. 14 tablet 0  . pramipexole (MIRAPEX) 1 MG tablet Take 1 mg by mouth at bedtime.    . predniSONE (DELTASONE) 20 MG tablet Use as directed by physician 20 tablet 0  . traMADol (ULTRAM) 50 MG tablet Take 1 tablet (50 mg total) by mouth every 12 (twelve) hours as needed. 30 tablet 0  . zolpidem (AMBIEN) 10 MG tablet Take 10 mg by mouth at bedtime as needed for sleep.     No current facility-administered medications for this visit.    SURGICAL HISTORY:  Past Surgical History  Procedure Laterality Date  . Carotid endarterectomy  10/15/2010    left  . Hand surgery      repair of the left long, ring, and small finger after a saw injury  . Video bronchoscopy  07/24/2012    Procedure: VIDEO BRONCHOSCOPY WITH FLUORO;  Surgeon: Kathee Delton, MD;  Location: [facility];  Service: Cardiopulmonary;  Laterality: Bilateral;  . Lobectomy Right 09/14/2012    Procedure: LOBECTOMY;  Surgeon: Ivin Poot, MD;  Location: [facility];  Service: Thoracic;  Laterality: Right;  RIGHT UPPER LOBECTOMY  . Video assisted thoracoscopy Right 09/14/2012    Procedure: VIDEO ASSISTED THORACOSCOPY;  Surgeon: Ivin Poot, MD;  Location: [facility];  Service: Thoracic;  Laterality: Right;  . Video bronchoscopy with endobronchial ultrasound N/A 08/27/2014    Procedure: VIDEO BRONCHOSCOPY WITH ENDOBRONCHIAL ULTRASOUND;  Surgeon: Ivin Poot, MD;  Location: [facility];  Service: Thoracic;  Laterality: N/A;  . Scalene node biopsy Right 08/27/2014     Procedure: BIOPSY SCALENE NODE;  Surgeon: Ivin Poot, MD;  Location: [facility];  Service: Thoracic;  Laterality: Right;  . Sterotactic radiosurgery Right 09/25/14    right parietal 30Gy/1 fx    REVIEW OF SYSTEMS:  Constitutional: positive for fatigue Eyes: negative Ears, nose, mouth, throat, and face: negative Respiratory: positive for cough and dyspnea on exertion Cardiovascular: negative Gastrointestinal: negative Genitourinary:negative Integument/breast: negative Hematologic/lymphatic: negative Musculoskeletal:negative Neurological: negative Behavioral/Psych: negative Endocrine: negative Allergic/Immunologic: negative   PHYSICAL EXAMINATION: General appearance: alert, cooperative and no distress Head: Normocephalic, without obvious abnormality, atraumatic Neck: no adenopathy, no JVD, supple, symmetrical, trachea midline and thyroid not enlarged, symmetric, no tenderness/mass/nodules Lymph nodes: Cervical, supraclavicular, and axillary nodes normal. Resp: wheezes bilaterally Back: symmetric, no curvature. ROM normal. No CVA  tenderness. Cardio: regular rate and rhythm, S1, S2 normal, no murmur, click, rub or gallop GI: soft, non-tender; bowel sounds normal; no masses,  no organomegaly Extremities: extremities normal, atraumatic, no cyanosis or edema Neurologic: Alert and oriented X 3, normal strength and tone. Normal symmetric reflexes. Normal coordination and gait  ECOG PERFORMANCE STATUS: 1 - Symptomatic but completely ambulatory  Blood pressure 148/96, pulse 81, temperature 99.4 F (37.4 C), temperature source Oral, resp. rate 18, height 5' 9.5" (1.765 m), weight 167 lb 14.4 oz (76.159 kg), SpO2 95 %.  LABORATORY DATA: Lab Results  Component Value Date   WBC 7.9 01/15/2015   HGB 11.6* 01/15/2015   HCT 34.5* 01/15/2015   MCV 93.1 01/15/2015   PLT 327 01/15/2015      Chemistry      Component Value Date/Time   NA 136 01/01/2015 1406   NA 138 12/05/2014 0618   K  3.0* 01/01/2015 1406   K 3.4* 12/05/2014 0618   CL 95* 12/05/2014 0618   CL 101 12/28/2012 0852   CO2 30* 01/01/2015 1406   CO2 32 12/05/2014 0618   BUN 8.5 01/01/2015 1406   BUN 9 12/05/2014 0618   CREATININE 0.8 01/01/2015 1406   CREATININE 0.52* 12/05/2014 0618      Component Value Date/Time   CALCIUM 9.2 01/01/2015 1406   CALCIUM 9.2 12/05/2014 0618   ALKPHOS 66 01/01/2015 1406   ALKPHOS 71 12/05/2014 0618   AST 14 01/01/2015 1406   AST 24 12/05/2014 0618   ALT 16 01/01/2015 1406   ALT 30 12/05/2014 0618   BILITOT 0.27 01/01/2015 1406   BILITOT 0.2* 12/05/2014 0618       RADIOGRAPHIC STUDIES: Dg Chest 2 View  12/30/2014   CLINICAL DATA:  Chronic cough and increased shortness of breath. Lung cancer.  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 Old right posterior lateral rib fractures.  Heart size is normal. Chronic apical pleural thickening. Surgical staples at the superior aspect of the right hilum.  No effusions.  No acute osseous abnormality.  IMPRESSION: No change since the prior exams. Extensive chronic lung disease with postsurgical changes and stable pulmonary nodules   Electronically Signed   By: Lorriane Shire M.D.   On: 12/30/2014 12:30    ASSESSMENT AND PLAN: This is a very pleasant 74 years old white male with history of stage IIA non-small cell lung cancer status post right upper lobectomy followed by 4 cycles of adjuvant chemotherapy.  The patient underwent systemic chemotherapy with carboplatin and gemcitabine status post 4 cycles discontinued secondary to intolerance and mild disease progression.  The patient is currently on treatment with Nivolumab status post 2 cycles and tolerating this treatment fairly well. I recommended for him to proceed with cycle #3 today as a  scheduled. The patient would come back for follow-up visit in 2 weeks for reevaluation before starting cycle #4. He was advised to call immediately if he has any concerning symptoms in the interval.  The patient voices understanding of current disease status and treatment options and is in agreement with the current care plan.  All questions were answered. The patient knows to call the clinic with any problems, questions or concerns. We can certainly see the patient much sooner if necessary.  Disclaimer: This note was dictated with voice recognition  software. Similar sounding words can inadvertently be transcribed and may not be corrected upon review.

## 2015-01-16 ENCOUNTER — Ambulatory Visit: Payer: Medicare Other | Admitting: Internal Medicine

## 2015-01-16 NOTE — Telephone Encounter (Signed)
Oxygen order needs NPI number.  Please make change with date and initial and refax.  Collaborative nurse notified.

## 2015-01-17 ENCOUNTER — Encounter: Payer: Self-pay | Admitting: Pulmonary Disease

## 2015-01-17 ENCOUNTER — Telehealth: Payer: Self-pay | Admitting: *Deleted

## 2015-01-17 ENCOUNTER — Ambulatory Visit (INDEPENDENT_AMBULATORY_CARE_PROVIDER_SITE_OTHER): Payer: Medicare Other | Admitting: Pulmonary Disease

## 2015-01-17 VITALS — BP 130/76 | HR 82 | Temp 100.0°F | Wt 167.6 lb

## 2015-01-17 DIAGNOSIS — I2510 Atherosclerotic heart disease of native coronary artery without angina pectoris: Secondary | ICD-10-CM

## 2015-01-17 DIAGNOSIS — C3491 Malignant neoplasm of unspecified part of right bronchus or lung: Secondary | ICD-10-CM | POA: Diagnosis not present

## 2015-01-17 DIAGNOSIS — R0600 Dyspnea, unspecified: Secondary | ICD-10-CM

## 2015-01-17 DIAGNOSIS — I10 Essential (primary) hypertension: Secondary | ICD-10-CM | POA: Diagnosis not present

## 2015-01-17 DIAGNOSIS — J418 Mixed simple and mucopurulent chronic bronchitis: Secondary | ICD-10-CM

## 2015-01-17 MED ORDER — BUDESONIDE 180 MCG/ACT IN AEPB: 2.0000 | INHALATION_SPRAY | Freq: Two times a day (BID) | RESPIRATORY_TRACT | Status: DC

## 2015-01-17 NOTE — Telephone Encounter (Signed)
Call received from wife who is with [name] at [facility] (Dr. Lenna Gilford).  "His temperature = 100.  Didn't know he had a fever until they checked it do we need to come over there now or what.  The chemotherapy instructions are to call for temp > 100.5."  With further assessment denies any symptoms of infection.  "He throws up food but this is normal for him."  Does have thermometer in the home.  Advised to check temperature, FF and call if any increase in temperature.  Go to ER if any shaking chills with or without temperature > 100.5.

## 2015-01-17 NOTE — Progress Notes (Addendum)
Subjective:    Patient ID: [name], male    DOB: 05-31-1941, 74 y.o.   MRN: [id number]  HPI  Baseline PFT 11/2011 showed FVC=3.17 (72%), FEV1=1.90 (63%), %1sec=60, mid-flows reduced at 32% predicted; 3% improvement in FEV1 after bronchodil; air trapping on LVs and DLCO was wnl...   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   3/14> he had RULobectomy by Dr. 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  He saw DrMohammed in consult & was quoted a 5-10% 52yrsurvival w. 4 cycles of adjuvant chemotherapy => completed 12/2012...  He remained disease free for 1.5 yrs, but continued smoking...    ~  July 19, 2014:  OV w/ KC>  Patient comes in today for follow-up of his known COPD. He was started on stiolto at the last visit, and did see some improvement. However, he thought the prednisone helped him more than anything else. Unfortunately he is continuing to smoke, and this is resulting in significant mucus production and cough. However, the mucus is nonpurulent. His family member is asking about chronic prednisone, and I have told her this is used as a last resort. He has been tried on inhaled corticosteroids in the past, but has had poor tolerance because of throat irritation and thrush. He has not been tried on small particle size Qvar.  F/u CT Chest 07/2014 showed new pulm nodules and extensive adenopathy- mediastinal, hilar, axillary...  Subseq PET scan 1/16 showed pos for widespread lymphangitic dis in both lungs, bilat hilar/  mediastinal/ right axillary, right supraclavic nodes, and right adrenal gland...  MRI Br 1/16 showed small enhancing lesion in right parietal lobe...  DrMohammed referred to Dr. VPrescott Gumfor bronch w/ EUS bx of mediastinal nodes=> pos EBUS and scalene node bx w/ metastatic squamous cell cancer.  3/16> DrM indicated to pt that his disease is incurable & further treatment palliative => they proceeded w/ further chemotherapy, 4 cycles completed 5/16...  He saw DrMoody in XRT 3/16 due to HAs & they gave 20Gy radiation in 1 fraction but HAs continued...  5/16> repeat MRI Brain showed resolution of small right parietal lesion , but new 35mleft frontal lesion & acute punctate small vessel infarct in the left centrum semiovale => one more dose of 20Gy radiation given 12/04/14...  F/u CT Chest/ Abd/ Pelvis 12/06/14 showed a mixed response to therapy w/ innumerable pulm nodules- some same or smaller, but nodes same or larger, liver ok but right adrenal met larger, & right kidney lesion is suspicious for met; vascular & coronary calcif, extensive spondylosis in lumbar spine...   DrMohammed offered the pt Opdivo immunotherapy & 1st dose given 12/18/14...   ~  December 18, 2014:  Add-on appt w/ SN for increased SOB & cough> pt c/o several month hx of increased SOB, cough, thick beige sput & intermittent wheezing, no hemoptysis; now on Opdivo for lung cancer palliative rx- I reviewed his hx in detail & went over his recent CT Chest/ Abd Pelvis results (their understanding was minimal & he does not  have a grasp of the prognosis- eg wife was surprised about the atherosclerosis & wanted to know what could be done about this)...    COPD, quit smoking 09/2014> on O2 at 2-3L/min, NEBS w/ Albut prn; he is very congested w/ bilat rhonchi & wheezing; we discussed Rx w/ DUONEB Qid, MUCINEX Qid + fluids, and Depo80+PRED taper...    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    HBP & ASHD> followed by [facility], his notes reviewed, on ECASA81, Metop25/d, Amlod10; BP= 150/80 and he knows that he can take an extra Metop25 daily if needed for BP...    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    GERD> on Protionix40    RLS> on Mirapex 52m Qhs...    Anxiety/ insomnia> on Klonopin0.5Bid, Ativan0.5 prn, Ambien10 prn...    Anemia> Labs by DrMohammed 12/05/14 showed Hg=7.6 & he was Tx 2u w/ Hg improved to 10.9 by 12/11/14...  We reviewed prob list, meds, xrays and labs>   CXR 12/05/14 showed norm heart size, diffuse interstitial coarsening & nodularity w/ scarring, s/p right lung surg, etc...  LABS 6/16: reviewed in Epic:  Chems- ok, BS=119, Alb=3.4;  CBC- Hg=10.9 after 2u PCs 12/05/14 for Hg=7.6  PLAN>>  We discussed optimizing his bronchodil, anti-inflamm, & mucolytic rx w/ DUONEB QID regularly, PRED taper (see AVS), and MUCINEX 6057mid +fluids; he will also take the [place]Bid regularly and extra Ativan 0.4m62mrn;  He will f/u w/ me in 3-4 wks...    ~  January 17, 2015:  31mo14mo w/ SN>  RogeKamrenorts improved- he's been using the NEBULIZER w/ Duoneb 2-3 x daily, taking MucinexBid, Klonopin0.5Bid, & he finished the Pred taper (it helped); he has mild cough, sm amt beige sputum (less thick), no CP;  Using O2 2-3L/min which he adjusts up & down as needed;  He saw DrMohamed 2d ago & he walked on RA w/ desat to 85% he says;  Pt & wife tell me that DrMohamed insisted that he NOT take any more steroids [facility]  EXAM reveals Afeb, VSS, O2sat=94% on 2L/min;  HEENT- neg, mallampati1;  Chest- decr BS w/ few bibasilar wheezes and rhonchi;  Heart- RR gr1/6 SEM w/o r/g;  Abd- soft, neg;  Ext- w/o c/c/e... We reviewed prob list, meds, xrays and labs>   Spirometry 01/17/15 showed FVC=2.35 (53%), FEV1=1.11 (33%),  %1sec=47, mid-flows are reduced at 13% predicted; c/e severe airflow obstruction & GOLD Stage 3-4 COPD... IMP/PLAN>>  Again asked him to do the Nebulizer w/ Duoneb Qid, Add PULMICORT twisthaler- 2spBid, take MUCINEX600-2Bid, continue cardiac meds, continue KlonopinBid... Continue Oxygen at 2-3L/min...    PCP= Dr. Bo FGeorga Borat Medical History  Diagnosis Date  . Arthritis   . Wheezing   . Sore throat   . Carotid artery occlusion   . COPD (chronic obstructive pulmonary disease)   . Lumbar disc disease   . Restless leg syndrome   . Allergic rhinitis   . Hypertension   . Anxiety     anxiety  . Shortness of breath   . Lung mass   . GERD (gastroesophageal reflux disease)   . H/O hiatal hernia   . Pre-operative cardiovascular examination   . Nonspecific abnormal unspecified cardiovascular function study   . Peripheral vascular disease, unspecified   . Pneumonia 2014    ?   .Marland Kitchen  On home oxygen therapy     prn  . Constipation   . Cancer 09/14/12    invasive mod diff squamous cell ca  . 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 S/P radiation therapy 12/04/14 srs    lt frontal brain    Past Surgical History  Procedure Laterality Date  . Carotid endarterectomy  10/15/2010    left  . Hand surgery      repair of the left long, ring, and small finger after a saw injury  . Video bronchoscopy  07/24/2012    Procedure: VIDEO BRONCHOSCOPY WITH FLUORO;  Surgeon: Kathee Delton, MD;  Location: [facility];  Service: Cardiopulmonary;  Laterality: Bilateral;  . Lobectomy Right 09/14/2012    Procedure: LOBECTOMY;  Surgeon: Ivin Poot, MD;  Location: [place];  Service: Thoracic;  Laterality: Right;  RIGHT UPPER LOBECTOMY  . Video assisted thoracoscopy Right 09/14/2012    Procedure: VIDEO ASSISTED THORACOSCOPY;  Surgeon: Ivin Poot, MD;  Location: [place];  Service: Thoracic;  Laterality: Right;  . Video bronchoscopy with endobronchial ultrasound N/A 08/27/2014    Procedure: VIDEO  BRONCHOSCOPY WITH ENDOBRONCHIAL ULTRASOUND;  Surgeon: Ivin Poot, MD;  Location: [place];  Service: Thoracic;  Laterality: N/A;  . Scalene node biopsy Right 08/27/2014    Procedure: BIOPSY SCALENE NODE;  Surgeon: Ivin Poot, MD;  Location: [facility] OR;  Service: Thoracic;  Laterality: Right;  . Sterotactic radiosurgery Right 09/25/14    right parietal 30Gy/1 fx    Outpatient Encounter Prescriptions as of 01/17/2015  Medication Sig  . amLODipine (NORVASC) 10 MG tablet Take 1 tablet (10 mg total) by mouth daily.  Marland Kitchen aspirin EC 81 MG tablet Take 81 mg by mouth daily.   . clonazePAM (KLONOPIN) 0.5 MG tablet Take 0.5 mg by mouth 2 (two) times daily as needed for anxiety. Takes one tablet by mouth two times daily as needed for anxiety.  Marland Kitchen HYDROcodone-acetaminophen (NORCO) 7.5-325 MG per tablet Take 1 tablet by mouth every 6 (six) hours as needed for moderate pain.  Marland Kitchen ipratropium-albuterol (DUONEB) 0.5-2.5 (3) MG/3ML SOLN Take 3 mLs by nebulization 4 (four) times daily.  . isosorbide mononitrate (IMDUR) 30 MG 24 hr tablet   . LORazepam (ATIVAN) 0.5 MG tablet Take 1 tablet by mouth or sublingually every 8 hours as needed for nausea. May take 1 tablet by mouth at bedtime.  . metoprolol tartrate (LOPRESSOR) 25 MG tablet Take 25 mg by mouth 2 (two) times daily.   Marland Kitchen omeprazole (PRILOSEC) 40 MG capsule Take 40 mg by mouth daily.  . pramipexole (MIRAPEX) 1 MG tablet Take 1 mg by mouth at bedtime.  . traMADol (ULTRAM) 50 MG tablet Take 1 tablet (50 mg total) by mouth every 12 (twelve) hours as needed.  . zolpidem (AMBIEN) 10 MG tablet Take 10 mg by mouth at bedtime as needed for sleep.  . potassium chloride SA (K-DUR,KLOR-CON) 20 MEQ tablet Take 1 tablet (20 mEq total) by mouth 2 (two) times daily. (Patient not taking: Reported on 01/17/2015)  . predniSONE (DELTASONE) 20 MG tablet Use as directed by physician (Patient not taking: Reported on 01/17/2015)    No Known Allergies   Current Medications, Allergies,  Past Medical History, Past Surgical History, Family History, and Social History were reviewed in [employer] record.   Review of Systems  Constitutional: Positive for fatigue. Negative for fever and unexpected weight change.  HENT: Positive for congestion. Negative for dental problem,  ear pain, nosebleeds, postnasal drip, rhinorrhea, sinus pressure, sneezing, sore throat and trouble swallowing.   Eyes: Negative for redness and itching.  Respiratory: Positive for cough, chest tightness, shortness of breath and wheezing.   Cardiovascular: Negative for palpitations and leg swelling.  Gastrointestinal: Negative for nausea and vomiting.  Genitourinary: Negative for dysuria.  Musculoskeletal: Negative for joint swelling.  Skin: Negative for rash.  Neurological: Positive for dizziness, weakness and headaches. Negative for seizures and syncope.  Hematological: Positive for adenopathy. Does not bruise/bleed easily.  Psychiatric/Behavioral: Positive for sleep disturbance and dysphoric mood. The patient is nervous/anxious.       Objective:   Physical Exam  Vital Signs:  Reviewed...  General:  WD, WN, 74 y/o WM chr ill appearing, in NAD; alert & oriented; pleasant & cooperative... HEENT:  [place]/AT; Conjunctiva- pink, Sclera- nonicteric, EOM-wnl, PERRLA, EACs-clear, TMs-wnl; NOSE-clear; THROAT-clear & wnl. Neck:  Supple w/ fair ROM; no JVD; s/p LCAE, faint R bruit; no thyromegaly or nodules palpated; + right supraclav lymphadenopathy. Chest:  Decr BS w/ few rhonchi & wheezes w/o consolidation Heart:  Regular Rhythm; Gr1/6 SEM, w/o rubs or gallops detected. Abdomen:  Soft & nontender- no guarding or rebound; normal bowel sounds; no organomegaly or masses palpated. Ext:  Normal ROM; without deformities, mild arthritic changes; no varicose veins, venous insuffic, or edema;  Pulses intact w/o bruits. Neuro:  No focal neuro deficits, gait normal & balance OK. Derm:  No lesions noted;  no rash etc. Lymph:  + right supraclavicular node palpated.     Assessment & Plan:    Severe COPD (GOLD Stage 3-4), quit smoking 09/2014> asked to get on the NEB w/ Duoneb Qid, ADD PULMICORT- 2spBid, continue MUCINEX2Bid, Oxygen at 2-3L/min...  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  HBP & ASHD> followed by [facility], his notes reviewed, on ECASA81, Metop25/d, Amlod10; BP= 150/80 and he knows that he can take an extra Metop25 daily if needed for BP...  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  GERD> on Protionix40  RLS> on Mirapex 21m Qhs...  Anxiety/ insomnia> on Klonopin0.5Bid, Ativan0.5 prn, Ambien10 prn...  Anemia> Labs by DrMohammed 12/05/14 showed Hg=7.6 & he was Tx 2u w/ Hg improved to 10.9 by 12/11/14...    Patient's Medications  New Prescriptions   BUDESONIDE (PULMICORT FLEXHALER) 180 MCG/ACT INHALER    Inhale 2 puffs into the lungs 2 (two) times daily.   BUDESONIDE (PULMICORT FLEXHALER) 180 MCG/ACT INHALER    Inhale 2 puffs into the lungs 2 (two) times daily.  Previous Medications   ALBUTEROL (PROVENTIL) (2.5 MG/3ML) 0.083% NEBULIZER SOLUTION   Use this up & switch to Duoneb Qid...   AMLODIPINE (NORVASC) 10 MG TABLET    Take 1 tablet (10 mg total) by mouth daily.   ASPIRIN EC 81 MG TABLET    Take 81 mg by mouth daily.    CLONAZEPAM (KLONOPIN) 0.5 MG TABLET    Take 0.5 mg by mouth 2 (two) times daily as needed for anxiety. Takes one tablet by mouth two times daily as needed for anxiety.   HYDROCODONE-ACETAMINOPHEN (NORCO) 7.5-325 MG PER TABLET    Take 1 tablet by mouth every 6 (six) hours as needed for moderate pain.   IPRATROPIUM (ATROVENT) 0.02 % NEBULIZER SOLUTION Use this up & switch to DUONEB Qid...   IPRATROPIUM-ALBUTEROL (DUONEB)  0.5-2.5 (3) MG/3ML SOLN    Use in nebulizer Qid...   ISOSORBIDE MONONITRATE (IMDUR) 30 MG 24 HR TABLET       LORAZEPAM (ATIVAN) 0.5 MG TABLET    Take 1 tablet by mouth or sublingually every 8 hours as needed for nausea. May take 1 tablet by mouth at bedtime.   METOPROLOL TARTRATE (LOPRESSOR) 25 MG TABLET    Take 25 mg by mouth 2 (two) times daily.    OMEPRAZOLE (PRILOSEC) 40 MG CAPSULE    Take 40 mg by mouth daily.   POTASSIUM CHLORIDE SA (K-DUR,KLOR-CON) 20 MEQ TABLET    Take 1 tablet (20 mEq total) by mouth 2 (two) times daily.   PRAMIPEXOLE (MIRAPEX) 1 MG TABLET    Take 1 mg by mouth at bedtime.   TRAMADOL (ULTRAM) 50 MG TABLET    Take 1 tablet (50 mg total) by mouth every 12 (twelve) hours as needed.   ZOLPIDEM (AMBIEN) 10 MG TABLET    Take 10 mg by mouth at bedtime as needed for sleep.  Modified Medications   No medications on file  Discontinued Medications   No medications on file

## 2015-01-17 NOTE — Patient Instructions (Signed)
Today we updated your med list in our EPIC system...     Go ahead and make the change from the Albuterol+Ipratropium to the [place] for your NEBULIZER    And use it in the machine 4 TIMES DAILY...  increase the MUCINEX '600mg'$  tabs to 2 tabs twice daily w/ lots of water by mouth...  Add the new inhaler> PULMICORT 2 sprays twice daily every day...  Let's plan a follow up recheck in 6-8 weeks.Marland KitchenMarland Kitchen

## 2015-01-21 ENCOUNTER — Other Ambulatory Visit: Payer: Self-pay

## 2015-01-29 ENCOUNTER — Ambulatory Visit (HOSPITAL_BASED_OUTPATIENT_CLINIC_OR_DEPARTMENT_OTHER): Payer: Medicare Other

## 2015-01-29 ENCOUNTER — Other Ambulatory Visit: Payer: Self-pay | Admitting: *Deleted

## 2015-01-29 ENCOUNTER — Telehealth: Payer: Self-pay | Admitting: Internal Medicine

## 2015-01-29 ENCOUNTER — Encounter: Payer: Self-pay | Admitting: Nurse Practitioner

## 2015-01-29 ENCOUNTER — Ambulatory Visit: Payer: Medicare Other

## 2015-01-29 ENCOUNTER — Other Ambulatory Visit: Payer: Medicare Other

## 2015-01-29 ENCOUNTER — Ambulatory Visit (HOSPITAL_BASED_OUTPATIENT_CLINIC_OR_DEPARTMENT_OTHER): Payer: Medicare Other | Admitting: Nurse Practitioner

## 2015-01-29 ENCOUNTER — Other Ambulatory Visit (HOSPITAL_BASED_OUTPATIENT_CLINIC_OR_DEPARTMENT_OTHER): Payer: Medicare Other

## 2015-01-29 VITALS — BP 158/71 | HR 87 | Temp 98.5°F | Resp 18 | Ht 69.5 in | Wt 173.0 lb

## 2015-01-29 DIAGNOSIS — C3491 Malignant neoplasm of unspecified part of right bronchus or lung: Secondary | ICD-10-CM

## 2015-01-29 DIAGNOSIS — G4700 Insomnia, unspecified: Secondary | ICD-10-CM | POA: Diagnosis not present

## 2015-01-29 DIAGNOSIS — E876 Hypokalemia: Secondary | ICD-10-CM

## 2015-01-29 DIAGNOSIS — Z79899 Other long term (current) drug therapy: Secondary | ICD-10-CM

## 2015-01-29 DIAGNOSIS — C3490 Malignant neoplasm of unspecified part of unspecified bronchus or lung: Secondary | ICD-10-CM

## 2015-01-29 DIAGNOSIS — Z85118 Personal history of other malignant neoplasm of bronchus and lung: Secondary | ICD-10-CM

## 2015-01-29 DIAGNOSIS — K5900 Constipation, unspecified: Secondary | ICD-10-CM

## 2015-01-29 DIAGNOSIS — Z5112 Encounter for antineoplastic immunotherapy: Secondary | ICD-10-CM | POA: Diagnosis present

## 2015-01-29 DIAGNOSIS — C7971 Secondary malignant neoplasm of right adrenal gland: Secondary | ICD-10-CM | POA: Diagnosis not present

## 2015-01-29 DIAGNOSIS — C3411 Malignant neoplasm of upper lobe, right bronchus or lung: Secondary | ICD-10-CM

## 2015-01-29 LAB — COMPREHENSIVE METABOLIC PANEL (CC13)
ALBUMIN: 3.7 g/dL (ref 3.5–5.0)
ALT: 16 U/L (ref 0–55)
AST: 17 U/L (ref 5–34)
Alkaline Phosphatase: 92 U/L (ref 40–150)
Anion Gap: 9 mEq/L (ref 3–11)
BUN: 10.6 mg/dL (ref 7.0–26.0)
CO2: 31 mEq/L — ABNORMAL HIGH (ref 22–29)
Calcium: 10 mg/dL (ref 8.4–10.4)
Chloride: 96 mEq/L — ABNORMAL LOW (ref 98–109)
Creatinine: 0.8 mg/dL (ref 0.7–1.3)
EGFR: 89 mL/min/{1.73_m2} — ABNORMAL LOW (ref >90)
Glucose: 110 mg/dl (ref 70–140)
POTASSIUM: 3.5 meq/L (ref 3.5–5.1)
Sodium: 136 mEq/L (ref 136–145)
TOTAL PROTEIN: 7.7 g/dL (ref 6.4–8.3)
Total Bilirubin: 0.24 mg/dL (ref 0.20–1.20)

## 2015-01-29 LAB — CBC WITH DIFFERENTIAL/PLATELET
BASO%: 0.7 % (ref 0.0–2.0)
BASOS ABS: 0 10*3/uL (ref 0.0–0.1)
EOS ABS: 0.1 10*3/uL (ref 0.0–0.5)
EOS%: 0.8 % (ref 0.0–7.0)
HCT: 38.2 % — ABNORMAL LOW (ref 38.4–49.9)
HEMOGLOBIN: 12.7 g/dL — AB (ref 13.0–17.1)
LYMPH%: 21.4 % (ref 14.0–49.0)
MCH: 31.1 pg (ref 27.2–33.4)
MCHC: 33.2 g/dL (ref 32.0–36.0)
MCV: 93.7 fL (ref 79.3–98.0)
MONO#: 0.6 10*3/uL (ref 0.1–0.9)
MONO%: 8 % (ref 0.0–14.0)
NEUT%: 69.1 % (ref 39.0–75.0)
NEUTROS ABS: 4.9 10*3/uL (ref 1.5–6.5)
Platelets: 277 10*3/uL (ref 140–400)
RBC: 4.08 10*6/uL — ABNORMAL LOW (ref 4.20–5.82)
RDW: 16.2 % — ABNORMAL HIGH (ref 11.0–14.6)
WBC: 7 10*3/uL (ref 4.0–10.3)
lymph#: 1.5 10*3/uL (ref 0.9–3.3)

## 2015-01-29 LAB — TSH CHCC: TSH: 1.437 m(IU)/L (ref 0.320–4.118)

## 2015-01-29 MED ORDER — HEPARIN SOD (PORK) LOCK FLUSH 100 UNIT/ML IV SOLN
500.0000 [IU] | Freq: Once | INTRAVENOUS | Status: AC | PRN
  Filled 2015-01-29: qty 5

## 2015-01-29 MED ORDER — HYDROCODONE-ACETAMINOPHEN 7.5-325 MG PO TABS: 1.0000 | ORAL_TABLET | Freq: Four times a day (QID) | ORAL | Status: DC | PRN

## 2015-01-29 MED ORDER — TRAMADOL HCL 50 MG PO TABS: 50.0000 mg | ORAL_TABLET | Freq: Two times a day (BID) | ORAL | Status: DC | PRN

## 2015-01-29 MED ORDER — SODIUM CHLORIDE 0.9 % IJ SOLN
10.0000 mL | INTRAMUSCULAR | Status: AC | PRN
  Filled 2015-01-29: qty 10

## 2015-01-29 MED ORDER — SODIUM CHLORIDE 0.9 % IV SOLN
3.0000 mg/kg | Freq: Once | INTRAVENOUS | Status: AC
  Administered 2015-01-29: 240 mg via INTRAVENOUS
  Filled 2015-01-29: qty 24

## 2015-01-29 MED ORDER — SODIUM CHLORIDE 0.9 % IV SOLN
Freq: Once | INTRAVENOUS | Status: AC
  Administered 2015-01-29: 10:00:00 via INTRAVENOUS

## 2015-01-29 NOTE — Progress Notes (Signed)
[name] [phone]   [phone]  OFFICE PROGRESS NOTE  Darren Miyamoto, MD [address] [place] [address]  DIAGNOSIS: Metastatic non-small cell lung cancer, squamous cell carcinoma initially diagnosed as Stage IIA (T2b., N0, M0) non-small cell lung cancer, squamous cell carcinoma diagnosed in December of 2013   PRIOR THERAPY:  1) Status post right upper lobectomy under the care of Dr. Prescott Gum on 09/14/2012.  2) Adjuvant chemotherapy with cisplatin 75 mg/M2 and docetaxel 75 mg/M2 with Neulasta support every 3 weeks, status post 1 cycle. Starting with cycle #2, patient will receive carboplatin for an AUC initially of 4.5 and paclitaxel at 175 mg per meter squared with Neulasta support given every 3 weeks. Status post a total of 3 cycles.  3) Systemic chemotherapy with carboplatin for AUC of 5 on day 1 and gemcitabine 1000 MG/M2 on days 1 and 8 every 3 weeks. First dose 09/11/2014. Status post 4 cycles. Discontinued secondary to intolerance  CURRENT THERAPY: Immunotherapy with Nivolumab 3 MG/KG every 2 weeks. Status post 3 cycles.  CHEMOTHERAPY INTENT: Palliative. CURRENT # OF CHEMOTHERAPY CYCLES: 3 CURRENT ANTIEMETICS: Zofran, dexamethasone and Compazine  CURRENT SMOKING STATUS: Quit smoking recently.  ORAL CHEMOTHERAPY AND CONSENT: None  CURRENT BISPHOSPHONATES USE: None  PAIN MANAGEMENT: 3/10 controlled by Norco when necessary  NARCOTICS INDUCED CONSTIPATION: Milk of magnesia on as-needed basis.  LIVING WILL AND CODE STATUS: No CODE BLUE.   INTERVAL HISTORY:  [name] 74 y.o. male returns to the clinic today for follow up visit, accompanied his wife. He is due for cycle #4 of nivolumab today. He tolerates this well overall. His main complaint is fatigue, cough, and shortness or breath. He is on oxygen continuously now. He coughs up a thick white sputum constantly. He was recently switched to pulmacort and duoneb, but he  is only using them half as often as prescribed. He denies chest pain, or hemoptysis. He denies fevers, chills, nausea or vomiting. He complains of constipation and bloating. He is not sleeping well. A detailed review of systems is otherwise stable.  MEDICAL HISTORY: Past Medical History  Diagnosis Date  . Arthritis   . Wheezing   . Sore throat   . Carotid artery occlusion   . COPD (chronic obstructive pulmonary disease)   . Lumbar disc disease   . Restless leg syndrome   . Allergic rhinitis   . Hypertension   . Anxiety     anxiety  . Shortness of breath   . Lung mass   . GERD (gastroesophageal reflux disease)   . H/O hiatal hernia   . Pre-operative cardiovascular examination   . Nonspecific abnormal unspecified cardiovascular function study   . Peripheral vascular disease, unspecified   . Pneumonia 2014    ?   Marland Kitchen On home oxygen therapy     prn  . Constipation   . Cancer 09/14/12    invasive mod diff squamous cell ca  . 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 S/P radiation therapy 12/04/14 srs    lt frontal brain    ALLERGIES:  has No Known Allergies.  MEDICATIONS:  Current Outpatient Prescriptions  Medication Sig Dispense Refill  . amLODipine (NORVASC) 10 MG tablet Take 1 tablet (10 mg total) by mouth daily. 90 tablet 3  . aspirin EC 81 MG tablet Take 81 mg by mouth daily.     . budesonide (PULMICORT FLEXHALER) 180  MCG/ACT inhaler Inhale 2 puffs into the lungs 2 (two) times daily. 1 Inhaler 4  . clonazePAM (KLONOPIN) 0.5 MG tablet Take 0.5 mg by mouth 2 (two) times daily as needed for anxiety. Takes one tablet by mouth two times daily as needed for anxiety.    Marland Kitchen ipratropium-albuterol (DUONEB) 0.5-2.5 (3) MG/3ML SOLN Take 3 mLs by nebulization 4 (four) times daily. (Patient taking differently: Take 3 mLs by nebulization 4 (four) times daily. Pt takes nebulizer 2 to 3 times daily.) 360 mL 2  . isosorbide mononitrate (IMDUR) 30 MG 24 hr tablet     . LORazepam  (ATIVAN) 0.5 MG tablet Take 1 tablet by mouth or sublingually every 8 hours as needed for nausea. May take 1 tablet by mouth at bedtime. 40 tablet 0  . metoprolol tartrate (LOPRESSOR) 25 MG tablet Take 25 mg by mouth 2 (two) times daily.     Marland Kitchen omeprazole (PRILOSEC) 40 MG capsule Take 40 mg by mouth daily.    . pramipexole (MIRAPEX) 1 MG tablet Take 1 mg by mouth at bedtime.    . budesonide (PULMICORT FLEXHALER) 180 MCG/ACT inhaler Inhale 2 puffs into the lungs 2 (two) times daily. 1 Inhaler 0  . HYDROcodone-acetaminophen (NORCO) 7.5-325 MG per tablet Take 1 tablet by mouth every 6 (six) hours as needed for moderate pain. 30 tablet 0  . potassium chloride SA (K-DUR,KLOR-CON) 20 MEQ tablet Take 1 tablet (20 mEq total) by mouth 2 (two) times daily. (Patient not taking: Reported on 01/17/2015) 14 tablet 0  . traMADol (ULTRAM) 50 MG tablet Take 1 tablet (50 mg total) by mouth every 12 (twelve) hours as needed. 30 tablet 0  . zolpidem (AMBIEN) 10 MG tablet Take 10 mg by mouth at bedtime as needed for sleep.     No current facility-administered medications for this visit.    SURGICAL HISTORY:  Past Surgical History  Procedure Laterality Date  . Carotid endarterectomy  10/15/2010    left  . Hand surgery      repair of the left long, ring, and small finger after a saw injury  . Video bronchoscopy  07/24/2012    Procedure: VIDEO BRONCHOSCOPY WITH FLUORO;  Surgeon: Kathee Delton, MD;  Location: [facility];  Service: Cardiopulmonary;  Laterality: Bilateral;  . Lobectomy Right 09/14/2012    Procedure: LOBECTOMY;  Surgeon: Ivin Poot, MD;  Location: [facility];  Service: Thoracic;  Laterality: Right;  RIGHT UPPER LOBECTOMY  . Video assisted thoracoscopy Right 09/14/2012    Procedure: VIDEO ASSISTED THORACOSCOPY;  Surgeon: Ivin Poot, MD;  Location: [facility];  Service: Thoracic;  Laterality: Right;  . Video bronchoscopy with endobronchial ultrasound N/A 08/27/2014    Procedure: VIDEO BRONCHOSCOPY WITH  ENDOBRONCHIAL ULTRASOUND;  Surgeon: Ivin Poot, MD;  Location: [facility];  Service: Thoracic;  Laterality: N/A;  . Scalene node biopsy Right 08/27/2014    Procedure: BIOPSY SCALENE NODE;  Surgeon: Ivin Poot, MD;  Location: [facility];  Service: Thoracic;  Laterality: Right;  . Sterotactic radiosurgery Right 09/25/14    right parietal 30Gy/1 fx    REVIEW OF SYSTEMS:  Constitutional: positive for fatigue Eyes: negative Ears, nose, mouth, throat, and face: negative Respiratory: positive for cough, dyspnea on exertion and sputum Cardiovascular: negative Gastrointestinal: positive for constipation and bloating Genitourinary:negative Integument/breast: negative Hematologic/lymphatic: negative Musculoskeletal:negative Neurological: negative Behavioral/Psych: negative Endocrine: negative Allergic/Immunologic: negative   PHYSICAL EXAMINATION: General appearance: alert, cooperative and no distress Head: Normocephalic, without obvious abnormality, atraumatic Neck: no adenopathy, no  JVD, supple, symmetrical, trachea midline and thyroid not enlarged, symmetric, no tenderness/mass/nodules Lymph nodes: Cervical, supraclavicular, and axillary nodes normal. Resp: wheezes bilaterally Back: symmetric, no curvature. ROM normal. No CVA tenderness. Cardio: regular rate and rhythm, S1, S2 normal, no murmur, click, rub or gallop GI: soft, non-tender; bowel sounds normal; no masses,  no organomegaly Extremities: extremities normal, atraumatic, no cyanosis or edema Neurologic: Alert and oriented X 3, normal strength and tone. Normal symmetric reflexes. Normal coordination and gait  ECOG PERFORMANCE STATUS: 1 - Symptomatic but completely ambulatory  Blood pressure 158/71, pulse 87, temperature 98.5 F (36.9 C), temperature source Oral, resp. rate 18, height 5' 9.5" (1.765 m), weight 173 lb (78.472 kg), SpO2 94 %.  LABORATORY DATA: Lab Results  Component Value Date   WBC 7.0 01/29/2015   HGB 12.7*  01/29/2015   HCT 38.2* 01/29/2015   MCV 93.7 01/29/2015   PLT 277 01/29/2015      Chemistry      Component Value Date/Time   NA 136 01/29/2015 0808   NA 138 12/05/2014 0618   K 3.5 01/29/2015 0808   K 3.4* 12/05/2014 0618   CL 95* 12/05/2014 0618   CL 101 12/28/2012 0852   CO2 31* 01/29/2015 0808   CO2 32 12/05/2014 0618   BUN 10.6 01/29/2015 0808   BUN 9 12/05/2014 0618   CREATININE 0.8 01/29/2015 0808   CREATININE 0.52* 12/05/2014 0618      Component Value Date/Time   CALCIUM 10.0 01/29/2015 0808   CALCIUM 9.2 12/05/2014 0618   ALKPHOS 92 01/29/2015 0808   ALKPHOS 71 12/05/2014 0618   AST 17 01/29/2015 0808   AST 24 12/05/2014 0618   ALT 16 01/29/2015 0808   ALT 30 12/05/2014 0618   BILITOT 0.24 01/29/2015 0808   BILITOT 0.2* 12/05/2014 0618       RADIOGRAPHIC STUDIES: Dg Chest 2 View  12/30/2014   CLINICAL DATA:  Chronic cough and increased shortness of breath. Lung cancer.  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 Old right posterior lateral rib fractures.  Heart size is normal. Chronic apical pleural thickening. Surgical staples at the superior aspect of the right hilum.  No effusions.  No acute osseous abnormality.  IMPRESSION: No change since the prior exams. Extensive chronic lung disease with postsurgical changes and stable pulmonary nodules   Electronically Signed   By: Lorriane Shire M.D.   On: 12/30/2014 12:30    ASSESSMENT AND PLAN: This is a very pleasant 74 years old white male with history of stage IIA non-small cell lung cancer status post right upper lobectomy followed by 4 cycles of adjuvant chemotherapy.  The patient underwent systemic chemotherapy with carboplatin and gemcitabine status post 4 cycles discontinued secondary to intolerance and mild disease  progression.  The patient is currently on treatment with Nivolumab status post 3 cycles and tolerating this treatment fairly well. The labs were reviewed in detail and were stable. He will proceed with cycle #4 today as planned.  I stressed the importance of using his inhaler and nebulizer as frequently as directed.  He will try melatonin QHS for sleep as the ativan has not helped.  He complains of bloating, and I suggested stool softeners and probiotics.  The patient will have a repeat CT chest/abdomen/pelvis next week and will return in  2 weeks for review of the results. . He was advised to call immediately if he has any concerning symptoms in the interval.  The patient voices understanding of current disease status and treatment options and is in agreement with the current care plan.  All questions were answered. The patient knows to call the clinic with any problems, questions or concerns. We can certainly see the patient much sooner if necessary.  Laurie Panda, NP 01/29/2015 9:23 AM

## 2015-01-29 NOTE — Patient Instructions (Signed)
[facility] Discharge Instructions for Patients Receiving Chemotherapy  Today you received the following chemotherapy agents Nivolumab  To help prevent nausea and vomiting after your treatment, we encourage you to take your nausea medication     If you develop nausea and vomiting that is not controlled by your nausea medication, call the clinic.   BELOW ARE SYMPTOMS THAT SHOULD BE REPORTED IMMEDIATELY:  *FEVER GREATER THAN 100.5 F  *CHILLS WITH OR WITHOUT FEVER  NAUSEA AND VOMITING THAT IS NOT CONTROLLED WITH YOUR NAUSEA MEDICATION  *UNUSUAL SHORTNESS OF BREATH  *UNUSUAL BRUISING OR BLEEDING  TENDERNESS IN MOUTH AND THROAT WITH OR WITHOUT PRESENCE OF ULCERS  *URINARY PROBLEMS  *BOWEL PROBLEMS  UNUSUAL RASH Items with * indicate a potential emergency and should be followed up as soon as possible.  Feel free to call the clinic you have any questions or concerns. The clinic phone number is [phone].  Please show the CHEMO ALERT CARD at check-in to the Emergency Department and triage nurse.   

## 2015-01-29 NOTE — Telephone Encounter (Signed)
Gave and printed appt sched adn avs for pt for Aug...gave barium

## 2015-02-10 ENCOUNTER — Ambulatory Visit (HOSPITAL_COMMUNITY)
Admission: RE | Admit: 2015-02-10 | Discharge: 2015-02-10 | Disposition: A | Discharge status: Home / Self Care | Payer: Medicare Other | Source: Ambulatory Visit | Attending: Nurse Practitioner | Admitting: Nurse Practitioner

## 2015-02-10 ENCOUNTER — Encounter (HOSPITAL_COMMUNITY): Payer: Self-pay

## 2015-02-10 DIAGNOSIS — C7901 Secondary malignant neoplasm of right kidney and renal pelvis: Secondary | ICD-10-CM | POA: Insufficient documentation

## 2015-02-10 DIAGNOSIS — R590 Localized enlarged lymph nodes: Secondary | ICD-10-CM | POA: Insufficient documentation

## 2015-02-10 DIAGNOSIS — C7971 Secondary malignant neoplasm of right adrenal gland: Secondary | ICD-10-CM | POA: Diagnosis not present

## 2015-02-10 DIAGNOSIS — I700 Atherosclerosis of aorta: Secondary | ICD-10-CM | POA: Diagnosis not present

## 2015-02-10 DIAGNOSIS — C7931 Secondary malignant neoplasm of brain: Secondary | ICD-10-CM

## 2015-02-10 DIAGNOSIS — M419 Scoliosis, unspecified: Secondary | ICD-10-CM | POA: Insufficient documentation

## 2015-02-10 DIAGNOSIS — R918 Other nonspecific abnormal finding of lung field: Secondary | ICD-10-CM | POA: Insufficient documentation

## 2015-02-10 DIAGNOSIS — C3491 Malignant neoplasm of unspecified part of right bronchus or lung: Secondary | ICD-10-CM

## 2015-02-10 HISTORY — DX: Secondary malignant neoplasm of brain: C79.31

## 2015-02-10 MED ORDER — IOHEXOL 300 MG/ML  SOLN
100.0000 mL | Freq: Once | INTRAMUSCULAR | Status: AC | PRN
  Administered 2015-02-10: 100 mL via INTRAVENOUS

## 2015-02-12 ENCOUNTER — Ambulatory Visit: Payer: Medicare Other

## 2015-02-12 ENCOUNTER — Other Ambulatory Visit: Payer: Self-pay | Admitting: Radiation Therapy

## 2015-02-12 ENCOUNTER — Ambulatory Visit (HOSPITAL_BASED_OUTPATIENT_CLINIC_OR_DEPARTMENT_OTHER): Payer: Medicare Other | Admitting: Physician Assistant

## 2015-02-12 ENCOUNTER — Telehealth: Payer: Self-pay | Admitting: Internal Medicine

## 2015-02-12 ENCOUNTER — Encounter: Payer: Self-pay | Admitting: Physician Assistant

## 2015-02-12 ENCOUNTER — Other Ambulatory Visit (HOSPITAL_BASED_OUTPATIENT_CLINIC_OR_DEPARTMENT_OTHER): Payer: Medicare Other

## 2015-02-12 ENCOUNTER — Encounter: Payer: Self-pay | Admitting: Radiation Therapy

## 2015-02-12 VITALS — BP 155/77 | HR 89 | Temp 98.4°F | Resp 18 | Ht 69.5 in | Wt 175.3 lb

## 2015-02-12 DIAGNOSIS — Z85118 Personal history of other malignant neoplasm of bronchus and lung: Secondary | ICD-10-CM

## 2015-02-12 DIAGNOSIS — C3411 Malignant neoplasm of upper lobe, right bronchus or lung: Secondary | ICD-10-CM

## 2015-02-12 DIAGNOSIS — J418 Mixed simple and mucopurulent chronic bronchitis: Secondary | ICD-10-CM

## 2015-02-12 DIAGNOSIS — C7989 Secondary malignant neoplasm of other specified sites: Secondary | ICD-10-CM | POA: Diagnosis not present

## 2015-02-12 DIAGNOSIS — C7931 Secondary malignant neoplasm of brain: Secondary | ICD-10-CM

## 2015-02-12 DIAGNOSIS — C7971 Secondary malignant neoplasm of right adrenal gland: Secondary | ICD-10-CM

## 2015-02-12 LAB — CBC WITH DIFFERENTIAL/PLATELET
BASO%: 0.7 % (ref 0.0–2.0)
Basophils Absolute: 0 10*3/uL (ref 0.0–0.1)
EOS%: 1.2 % (ref 0.0–7.0)
Eosinophils Absolute: 0.1 10*3/uL (ref 0.0–0.5)
HEMATOCRIT: 35.7 % — AB (ref 38.4–49.9)
HEMOGLOBIN: 11.9 g/dL — AB (ref 13.0–17.1)
LYMPH%: 27.5 % (ref 14.0–49.0)
MCH: 31 pg (ref 27.2–33.4)
MCHC: 33.5 g/dL (ref 32.0–36.0)
MCV: 92.6 fL (ref 79.3–98.0)
MONO#: 0.6 10*3/uL (ref 0.1–0.9)
MONO%: 9.1 % (ref 0.0–14.0)
NEUT%: 61.5 % (ref 39.0–75.0)
NEUTROS ABS: 3.7 10*3/uL (ref 1.5–6.5)
PLATELETS: 290 10*3/uL (ref 140–400)
RBC: 3.85 10*6/uL — ABNORMAL LOW (ref 4.20–5.82)
RDW: 15.6 % — AB (ref 11.0–14.6)
WBC: 6.1 10*3/uL (ref 4.0–10.3)
lymph#: 1.7 10*3/uL (ref 0.9–3.3)

## 2015-02-12 LAB — COMPREHENSIVE METABOLIC PANEL (CC13)
ALT: 17 U/L (ref 0–55)
ANION GAP: 5 meq/L (ref 3–11)
AST: 17 U/L (ref 5–34)
Albumin: 3.5 g/dL (ref 3.5–5.0)
Alkaline Phosphatase: 89 U/L (ref 40–150)
BILIRUBIN TOTAL: 0.24 mg/dL (ref 0.20–1.20)
BUN: 6.2 mg/dL — AB (ref 7.0–26.0)
CO2: 30 mEq/L — ABNORMAL HIGH (ref 22–29)
CREATININE: 0.8 mg/dL (ref 0.7–1.3)
Calcium: 9 mg/dL (ref 8.4–10.4)
Chloride: 101 mEq/L (ref 98–109)
EGFR: 89 mL/min/{1.73_m2} — ABNORMAL LOW (ref >90)
Glucose: 110 mg/dl (ref 70–140)
POTASSIUM: 3.4 meq/L — AB (ref 3.5–5.1)
SODIUM: 135 meq/L — AB (ref 136–145)
Total Protein: 7 g/dL (ref 6.4–8.3)

## 2015-02-12 MED ORDER — HYDROCODONE-ACETAMINOPHEN 7.5-325 MG PO TABS: 1.0000 | ORAL_TABLET | Freq: Four times a day (QID) | ORAL | Status: DC | PRN

## 2015-02-12 NOTE — Progress Notes (Addendum)
[place] [phone]   [phone]  OFFICE PROGRESS NOTE  Darren Miyamoto, MD [address] [place] [address]  DIAGNOSIS: Metastatic non-small cell lung cancer, squamous cell carcinoma initially diagnosed as Stage IIA (T2b., N0, M0) non-small cell lung cancer, squamous cell carcinoma diagnosed in December of 2013   PRIOR THERAPY:  1) Status post right upper lobectomy under the care of Dr. Prescott Gum on 09/14/2012.  2) Adjuvant chemotherapy with cisplatin 75 mg/M2 and docetaxel 75 mg/M2 with Neulasta support every 3 weeks, status post 1 cycle. Starting with cycle #2, patient will receive carboplatin for an AUC initially of 4.5 and paclitaxel at 175 mg per meter squared with Neulasta support given every 3 weeks. Status post a total of 3 cycles.  3) Systemic chemotherapy with carboplatin for AUC of 5 on day 1 and gemcitabine 1000 MG/M2 on days 1 and 8 every 3 weeks. First dose 09/11/2014. Status post 4 cycles. Discontinued secondary to intolerance  CURRENT THERAPY: Immunotherapy with Nivolumab 3 MG/KG every 2 weeks. Status post 4 cycles.  CHEMOTHERAPY INTENT: Palliative. CURRENT # OF CHEMOTHERAPY CYCLES: 3 CURRENT ANTIEMETICS: Zofran, dexamethasone and Compazine  CURRENT SMOKING STATUS: Quit smoking recently.  ORAL CHEMOTHERAPY AND CONSENT: None  CURRENT BISPHOSPHONATES USE: None  PAIN MANAGEMENT: 3/10 controlled by Norco when necessary  NARCOTICS INDUCED CONSTIPATION: Milk of magnesia on as-needed basis.  LIVING WILL AND CODE STATUS: No CODE BLUE.   INTERVAL HISTORY:  [name] 74 y.o. male returns to the clinic today for follow up visit, accompanied his wife and daughter. He is due for cycle #5 of nivolumab today. He is tolerating this well overall. He reports less fatigue but continues to have cough and shortness of breath with exertion.  He continues on oxygen continuously. He denies chest pain, or hemoptysis. He denies fevers,  chills, nausea or vomiting.  A detailed review of systems is otherwise stable. He recently had a restaging CT scan of the chest, abdomen and pelvis and presents to discuss the results.  MEDICAL HISTORY: Past Medical History  Diagnosis Date  . Arthritis   . Wheezing   . Sore throat   . Carotid artery occlusion   . COPD (chronic obstructive pulmonary disease)   . Lumbar disc disease   . Restless leg syndrome   . Allergic rhinitis   . Hypertension   . Anxiety     anxiety  . Shortness of breath   . Lung mass   . GERD (gastroesophageal reflux disease)   . H/O hiatal hernia   . Pre-operative cardiovascular examination   . Nonspecific abnormal unspecified cardiovascular function study   . Peripheral vascular disease, unspecified   . Pneumonia 2014    ?   Marland Kitchen On home oxygen therapy     prn  . Constipation   . Cancer 09/14/12    invasive mod diff squamous cell ca  . 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 S/P radiation therapy 12/04/14 srs    lt frontal brain    ALLERGIES:  has No Known Allergies.  MEDICATIONS:  Current Outpatient Prescriptions  Medication Sig Dispense Refill  . amLODipine (NORVASC) 10 MG tablet Take 1 tablet (10 mg total) by mouth daily. 90 tablet 3  . aspirin EC 81 MG tablet Take 81 mg by mouth daily.     . budesonide (PULMICORT FLEXHALER) 180 MCG/ACT inhaler Inhale 2 puffs into the lungs 2 (two)  times daily. 1 Inhaler 4  . clonazePAM (KLONOPIN) 0.5 MG tablet Take 0.5 mg by mouth 2 (two) times daily as needed for anxiety. Takes one tablet by mouth two times daily as needed for anxiety.    Marland Kitchen HYDROcodone-acetaminophen (NORCO) 7.5-325 MG per tablet Take 1 tablet by mouth every 6 (six) hours as needed for moderate pain. 30 tablet 0  . ipratropium-albuterol (DUONEB) 0.5-2.5 (3) MG/3ML SOLN Take 3 mLs by nebulization 4 (four) times daily. (Patient taking differently: Take 3 mLs by nebulization 4 (four) times daily. Pt takes nebulizer 2 to 3 times daily.) 360  mL 2  . isosorbide mononitrate (IMDUR) 30 MG 24 hr tablet     . LORazepam (ATIVAN) 0.5 MG tablet Take 1 tablet by mouth or sublingually every 8 hours as needed for nausea. May take 1 tablet by mouth at bedtime. 40 tablet 0  . metoprolol tartrate (LOPRESSOR) 25 MG tablet Take 25 mg by mouth 2 (two) times daily.     Marland Kitchen omeprazole (PRILOSEC) 40 MG capsule Take 40 mg by mouth daily.    . pramipexole (MIRAPEX) 1 MG tablet Take 1 mg by mouth at bedtime.    . traMADol (ULTRAM) 50 MG tablet Take 1 tablet (50 mg total) by mouth every 12 (twelve) hours as needed. 30 tablet 0  . zolpidem (AMBIEN) 10 MG tablet Take 10 mg by mouth at bedtime as needed for sleep.    . budesonide (PULMICORT FLEXHALER) 180 MCG/ACT inhaler Inhale 2 puffs into the lungs 2 (two) times daily. 1 Inhaler 0  . potassium chloride SA (K-DUR,KLOR-CON) 20 MEQ tablet Take 1 tablet (20 mEq total) by mouth 2 (two) times daily. (Patient not taking: Reported on 01/17/2015) 14 tablet 0   No current facility-administered medications for this visit.   Facility-Administered Medications Ordered in Other Visits  Medication Dose Route Frequency Provider Last Rate Last Dose  . sodium chloride 0.9 % injection 10 mL  10 mL Intracatheter PRN Curt Bears, MD   10 mL at 01/29/15 0944    SURGICAL HISTORY:  Past Surgical History  Procedure Laterality Date  . Carotid endarterectomy  10/15/2010    left  . Hand surgery      repair of the left long, ring, and small finger after a saw injury  . Video bronchoscopy  07/24/2012    Procedure: VIDEO BRONCHOSCOPY WITH FLUORO;  Surgeon: Kathee Delton, MD;  Location: [facility];  Service: Cardiopulmonary;  Laterality: Bilateral;  . Lobectomy Right 09/14/2012    Procedure: LOBECTOMY;  Surgeon: Ivin Poot, MD;  Location: [facility];  Service: Thoracic;  Laterality: Right;  RIGHT UPPER LOBECTOMY  . Video assisted thoracoscopy Right 09/14/2012    Procedure: VIDEO ASSISTED THORACOSCOPY;  Surgeon: Ivin Poot, MD;   Location: [facility];  Service: Thoracic;  Laterality: Right;  . Video bronchoscopy with endobronchial ultrasound N/A 08/27/2014    Procedure: VIDEO BRONCHOSCOPY WITH ENDOBRONCHIAL ULTRASOUND;  Surgeon: Ivin Poot, MD;  Location: [facility];  Service: Thoracic;  Laterality: N/A;  . Scalene node biopsy Right 08/27/2014    Procedure: BIOPSY SCALENE NODE;  Surgeon: Ivin Poot, MD;  Location: [facility];  Service: Thoracic;  Laterality: Right;  . Sterotactic radiosurgery Right 09/25/14    right parietal 30Gy/1 fx    REVIEW OF SYSTEMS:  Constitutional: positive for fatigue Eyes: negative Ears, nose, mouth, throat, and face: negative Respiratory: positive for cough, dyspnea on exertion and sputum Cardiovascular: negative Gastrointestinal: negative Genitourinary:negative Integument/breast: negative Hematologic/lymphatic: negative Musculoskeletal:negative Neurological:  negative Behavioral/Psych: negative Endocrine: negative Allergic/Immunologic: negative   PHYSICAL EXAMINATION: General appearance: alert, cooperative and no distress Head: Normocephalic, without obvious abnormality, atraumatic Neck: no adenopathy, no JVD, supple, symmetrical, trachea midline and thyroid not enlarged, symmetric, no tenderness/mass/nodules Lymph nodes: Cervical, supraclavicular, and axillary nodes normal. Resp: wheezes bilaterally Back: symmetric, no curvature. ROM normal. No CVA tenderness. Cardio: regular rate and rhythm, S1, S2 normal, no murmur, click, rub or gallop GI: soft, non-tender; bowel sounds normal; no masses,  no organomegaly Extremities: extremities normal, atraumatic, no cyanosis or edema Neurologic: Alert and oriented X 3, normal strength and tone. Normal symmetric reflexes. Normal coordination and gait  ECOG PERFORMANCE STATUS: 1 - Symptomatic but completely ambulatory  Blood pressure 155/77, pulse 89, temperature 98.4 F (36.9 C), temperature source Oral, resp. rate 18, height 5' 9.5" (1.765 m),  weight 175 lb 4.8 oz (79.516 kg), SpO2 98 %.  LABORATORY DATA: Lab Results  Component Value Date   WBC 6.1 02/12/2015   HGB 11.9* 02/12/2015   HCT 35.7* 02/12/2015   MCV 92.6 02/12/2015   PLT 290 02/12/2015      Chemistry      Component Value Date/Time   NA 135* 02/12/2015 1322   NA 138 12/05/2014 0618   K 3.4* 02/12/2015 1322   K 3.4* 12/05/2014 0618   CL 95* 12/05/2014 0618   CL 101 12/28/2012 0852   CO2 30* 02/12/2015 1322   CO2 32 12/05/2014 0618   BUN 6.2* 02/12/2015 1322   BUN 9 12/05/2014 0618   CREATININE 0.8 02/12/2015 1322   CREATININE 0.52* 12/05/2014 0618      Component Value Date/Time   CALCIUM 9.0 02/12/2015 1322   CALCIUM 9.2 12/05/2014 0618   ALKPHOS 89 02/12/2015 1322   ALKPHOS 71 12/05/2014 0618   AST 17 02/12/2015 1322   AST 24 12/05/2014 0618   ALT 17 02/12/2015 1322   ALT 30 12/05/2014 0618   BILITOT 0.24 02/12/2015 1322   BILITOT 0.2* 12/05/2014 0618       RADIOGRAPHIC STUDIES: Ct Chest W Contrast  02/10/2015   CLINICAL DATA:  Followup lung cancer.  EXAM: CT CHEST, ABDOMEN, AND PELVIS WITH CONTRAST  TECHNIQUE: Multidetector CT imaging of the chest, abdomen and pelvis was performed following the standard protocol during bolus administration of intravenous contrast.  CONTRAST:  175m OMNIPAQUE IOHEXOL 300 MG/ML  SOLN  COMPARISON:  12/06/2014  FINDINGS: CT CHEST FINDINGS  Mediastinum: The heart size appears normal. Aortic atherosclerosis identified. The trachea is patent and midline. On unremarkable appearance of the esophagus. Index right subpectoral lymph node measures 7 mm, image 10/series 2. Unchanged from previous exam. Index right supraclavicular lymph node measures 11 mm, image 4/ series 2. Previously 13 mm. Index right axillary node measures 1.2 cm, image 16/series 2. Previously this measured the same. There is a right paratracheal lymph node which measures 8 mm, image 20/series 2. New from previous exam.  Lungs/Pleura: No pleural effusion  identified. There is mild pleural thickening within the posterior right lung base. Diffuse increased interstitial markings and pulmonary nodularity noted bilaterally. Index nodule within the right upper lobe measures 8 mm, image 13/ series 4. Unchanged from previous exam. Subpleural nodule within the left lower lobe measures 1.2 cm, image 43/series 4. Previously 8 mm. Subpleural nodularity within the posterior left lung base measures 2.4 cm, image 51/series 4. Previously 2.6 cm. There is a 7 mm nodule within the posterior right lung base, image 41/series 4. Previously 8 mm.  Musculoskeletal: No aggressive lytic  or sclerotic bone lesions. Chronic healed right posterior rib fracture deformities identified. There are no aggressive lytic or sclerotic bone lesions.  CT ABDOMEN AND PELVIS FINDINGS  Hepatobiliary: No suspicious liver abnormalities identified. The gallbladder appears normal. No biliary dilatation.  Pancreas: The pancreas appears normal.  Spleen: Normal appearance of the spleen.  Adrenals/Urinary Tract: The left adrenal gland appears normal. Right adrenal nodule measures 2.2 x 1.5 cm, image 57/series 2. Previously 1.8 x 1.1 cm. 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 This now measures 6.4 cm and appears to obstruct the right upper pole calyx, image 62/ series 2. Previously this measured 2.7 cm. The left kidney appears normal. Urinary bladder appears normal.  Stomach/Bowel: The stomach and the small bowel loops have a normal course and caliber. The appendix is visualized and appears normal. Normal appearance of the colon.  Vascular/Lymphatic: Calcified atherosclerotic disease involves the abdominal aorta. No aneurysm. The abdominal aorta measures 2.4 cm in AP dimension. No upper abdominal adenopathy. There is no pelvic or inguinal adenopathy.  Reproductive: Prostate gland and seminal vesicles are unremarkable.  Other: No free fluid or fluid collections within the abdomen or pelvis.   Musculoskeletal: There is no aggressive lytic or sclerotic bone lesion. Scoliosis deformity is noted. There is multi level degenerative disc disease identified.  IMPRESSION: 1. Interval mixed response to therapy. 2. 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 Additionally, there are several pulmonary nodules which are larger as well as a enlarging right paratracheal lymph node. No significant change in right supraclavicular and right axillary adenopathy. 3. Aortic atherosclerosis. 4. Scoliosis and degenerative disc disease.   Electronically Signed   By: Kerby Moors M.D.   On: 02/10/2015 08:53   Ct Abdomen Pelvis W Contrast  02/10/2015   CLINICAL DATA:  Followup lung cancer.  EXAM: CT CHEST, ABDOMEN, AND PELVIS WITH CONTRAST  TECHNIQUE: Multidetector CT imaging of the chest, abdomen and pelvis was performed following the standard protocol during bolus administration of intravenous contrast.  CONTRAST:  122m OMNIPAQUE IOHEXOL 300 MG/ML  SOLN  COMPARISON:  12/06/2014  FINDINGS: CT CHEST FINDINGS  Mediastinum: The heart size appears normal. Aortic atherosclerosis identified. The trachea is patent and midline. On unremarkable appearance of the esophagus. Index right subpectoral lymph node measures 7 mm, image 10/series 2. Unchanged from previous exam. Index right supraclavicular lymph node measures 11 mm, image 4/ series 2. Previously 13 mm. Index right axillary node measures 1.2 cm, image 16/series 2. Previously this measured the same. There is a right paratracheal lymph node which measures 8 mm, image 20/series 2. New from previous exam.  Lungs/Pleura: No pleural effusion identified. There is mild pleural thickening within the posterior right lung base. Diffuse increased interstitial markings and pulmonary nodularity noted bilaterally. Index nodule within the right upper lobe measures 8 mm, image 13/ series 4. Unchanged from previous exam. Subpleural nodule  within the left lower lobe measures 1.2 cm, image 43/series 4. Previously 8 mm. Subpleural nodularity within the posterior left lung base measures 2.4 cm, image 51/series 4. Previously 2.6 cm. There is a 7 mm nodule within the posterior right lung base, image 41/series 4. Previously 8 mm.  Musculoskeletal: No aggressive lytic or sclerotic bone lesions. Chronic healed right posterior rib fracture deformities identified. There are no aggressive lytic or sclerotic bone lesions.  CT ABDOMEN AND PELVIS FINDINGS  Hepatobiliary: No suspicious liver abnormalities identified. The gallbladder appears normal. No biliary dilatation.  Pancreas: The pancreas appears  normal.  Spleen: Normal appearance of the spleen.  Adrenals/Urinary Tract: The left adrenal gland appears normal. Right adrenal nodule measures 2.2 x 1.5 cm, image 57/series 2. Previously 1.8 x 1.1 cm. 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 This now measures 6.4 cm and appears to obstruct the right upper pole calyx, image 62/ series 2. Previously this measured 2.7 cm. The left kidney appears normal. Urinary bladder appears normal.  Stomach/Bowel: The stomach and the small bowel loops have a normal course and caliber. The appendix is visualized and appears normal. Normal appearance of the colon.  Vascular/Lymphatic: Calcified atherosclerotic disease involves the abdominal aorta. No aneurysm. The abdominal aorta measures 2.4 cm in AP dimension. No upper abdominal adenopathy. There is no pelvic or inguinal adenopathy.  Reproductive: Prostate gland and seminal vesicles are unremarkable.  Other: No free fluid or fluid collections within the abdomen or pelvis.  Musculoskeletal: There is no aggressive lytic or sclerotic bone lesion. Scoliosis deformity is noted. There is multi level degenerative disc disease identified.  IMPRESSION: 1. Interval mixed response to therapy. 2. 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 Additionally, there are several pulmonary nodules which are larger as well as a enlarging right paratracheal lymph node. No significant change in right supraclavicular and right axillary adenopathy. 3. Aortic atherosclerosis. 4. Scoliosis and degenerative disc disease.   Electronically Signed   By: Kerby Moors M.D.   On: 02/10/2015 08:53    ASSESSMENT AND PLAN: This is a very pleasant 74 years old white male with history of stage IIA non-small cell lung cancer status post right upper lobectomy followed by 4 cycles of adjuvant chemotherapy.  The patient underwent systemic chemotherapy with carboplatin and gemcitabine status post 4 cycles discontinued secondary to intolerance and mild disease progression.  The patient is currently on treatment with Nivolumab status post 4 cycles and tolerating this treatment fairly well. The labs were reviewed in detail and were stable. His recent restaging CT scan revealed stable disease within the chest however evidence for disease progression involving the right adrenal metastasis and right kidney metastasis. Patient was discussed with and also seen by [name]. As he has evidence for disease progression, we will discontinue treatment with nivoulmab. We'll plan to refer him to [facility] for second opinion regarding his disease and see if there are any possible clinical research trials that may be of help for him. In the interim, we will plan to proceed with systemic chemotherapy with docetaxel and Cyramza in 2 weeks.  He is to continue using his inhaler and nebulizer as frequently as directed.   He was advised to call immediately if he has any concerning symptoms in the interval.  The patient voices understanding of current disease status and treatment options and is in agreement with the current care plan.  All questions were answered. The patient knows to call the clinic with any problems, questions or concerns. We can certainly see the patient much sooner  if necessary.  Carlton Adam, PA-C 02/12/2015 4:51 PM  ADDENDUM: Hematology/Oncology Attending: I had a face to face encounter with the patient. I recommended his care plan. This is a very pleasant 74 years old white male with metastatic non-small cell lung cancer, squamous cell carcinoma treated with systemic chemotherapy with carboplatin and gemcitabine discontinued secondary to intolerance and recently with immunotherapy with Nivolumab status post 4 cycles. He tolerated the immunotherapy fairly well but unfortunately the recent CT scan of the  chest, abdomen and pelvis showed evidence for disease progression in the right adrenal as well as the right kidney but stable disease in the chest. I discussed the scan results with the patient and his family. I recommended for him to discontinue his treatment with Nivolumab at this point. I discussed with the patient other treatment options including systemic chemotherapy with docetaxel and Cyramza. I discussed with the patient adverse effect of this treatment. He is expected to start the first cycle of this treatment in 2 weeks. I also discussed with the patient referral to Dr. Sharlet Salina at [facility] for second opinion and to see if the patient is eligible for any clinical trial at [facility]. The patient his family agreed to the current plan. He will come back for follow-up visit in 2 weeks for reevaluation before starting his first dose of systemic chemotherapy. He was advised to call immediately if he has any concerning symptoms in the interval.  Disclaimer: This note was dictated with voice recognition software. Similar sounding words can inadvertently be transcribed and may be missed upon review.  Eilleen Kempf., MD 02/12/2015

## 2015-02-12 NOTE — Patient Instructions (Addendum)
Docetaxel injection What is this medicine? DOCETAXEL (doe se TAX el) is a chemotherapy drug. It targets fast dividing cells, like cancer cells, and causes these cells to die. This medicine is used to treat many types of cancers like breast cancer, certain stomach cancers, head and neck cancer, lung cancer, and prostate cancer. This medicine may be used for other purposes; ask your health care provider or pharmacist if you have questions. COMMON BRAND NAME(S): Docefrez, Taxotere What should I tell my health care provider before I take this medicine? They need to know if you have any of these conditions: -infection (especially a virus infection such as chickenpox, cold sores, or herpes) -liver disease -low blood counts, like low white cell, platelet, or red cell counts -an unusual or allergic reaction to docetaxel, polysorbate 80, other chemotherapy agents, other medicines, foods, dyes, or preservatives -pregnant or trying to get pregnant -breast-feeding How should I use this medicine? This drug is given as an infusion into a vein. It is administered in a hospital or clinic by a specially trained health care professional. Talk to your pediatrician regarding the use of this medicine in children. Special care may be needed. Overdosage: If you think you have taken too much of this medicine contact a poison control center or emergency room at once. NOTE: This medicine is only for you. Do not share this medicine with others. What if I miss a dose? It is important not to miss your dose. Call your doctor or health care professional if you are unable to keep an appointment. What may interact with this medicine? -cyclosporine -erythromycin -ketoconazole -medicines to increase blood counts like filgrastim, pegfilgrastim, sargramostim -vaccines Talk to your doctor or health care professional before taking any of these medicines: -acetaminophen -aspirin -ibuprofen -ketoprofen -naproxen This list  may not describe all possible interactions. Give your health care provider a list of all the medicines, herbs, non-prescription drugs, or dietary supplements you use. Also tell them if you smoke, drink alcohol, or use illegal drugs. Some items may interact with your medicine. What should I watch for while using this medicine? Your condition will be monitored carefully while you are receiving this medicine. You will need important blood work done while you are taking this medicine. This drug may make you feel generally unwell. This is not uncommon, as chemotherapy can affect healthy cells as well as cancer cells. Report any side effects. Continue your course of treatment even though you feel ill unless your doctor tells you to stop. In some cases, you may be given additional medicines to help with side effects. Follow all directions for their use. Call your doctor or health care professional for advice if you get a fever, chills or sore throat, or other symptoms of a cold or flu. Do not treat yourself. This drug decreases your body's ability to fight infections. Try to avoid being around people who are sick. This medicine may increase your risk to bruise or bleed. Call your doctor or health care professional if you notice any unusual bleeding. Be careful brushing and flossing your teeth or using a toothpick because you may get an infection or bleed more easily. If you have any dental work done, tell your dentist you are receiving this medicine. Avoid taking products that contain aspirin, acetaminophen, ibuprofen, naproxen, or ketoprofen unless instructed by your doctor. These medicines may hide a fever. This medicine contains an alcohol in the product. You may get drowsy or dizzy. Do not drive, use machinery, or do anything  that needs mental alertness until you know how this medicine affects you. Do not stand or sit up quickly, especially if you are an older patient. This reduces the risk of dizzy or  fainting spells. Avoid alcoholic drinks Do not become pregnant while taking this medicine. Women should inform their doctor if they wish to become pregnant or think they might be pregnant. There is a potential for serious side effects to an unborn child. Talk to your health care professional or pharmacist for more information. Do not breast-feed an infant while taking this medicine. What side effects may I notice from receiving this medicine? Side effects that you should report to your doctor or health care professional as soon as possible: -allergic reactions like skin rash, itching or hives, swelling of the face, lips, or tongue -low blood counts - This drug may decrease the number of white blood cells, red blood cells and platelets. You may be at increased risk for infections and bleeding. -signs of infection - fever or chills, cough, sore throat, pain or difficulty passing urine -signs of decreased platelets or bleeding - bruising, pinpoint red spots on the skin, black, tarry stools, nosebleeds -signs of decreased red blood cells - unusually weak or tired, fainting spells, lightheadedness -breathing problems -fast or irregular heartbeat -low blood pressure -mouth sores -nausea and vomiting -pain, swelling, redness or irritation at the injection site -pain, tingling, numbness in the hands or feet -swelling of the ankle, feet, hands -weight gain Side effects that usually do not require medical attention (report to your prescriber or health care professional if they continue or are bothersome): -bone pain -complete hair loss including hair on your head, underarms, pubic hair, eyebrows, and eyelashes -diarrhea -excessive tearing -changes in the color of fingernails -loosening of the fingernails -nausea -muscle pain -red flush to skin -sweating -weak or tired This list may not describe all possible side effects. Call your doctor for medical advice about side effects. You may report side  effects to FDA at 1-800-FDA-1088. Where should I keep my medicine? This drug is given in a hospital or clinic and will not be stored at home. NOTE: This sheet is a summary. It may not cover all possible information. If you have questions about this medicine, talk to your doctor, pharmacist, or health care provider.  2015, Elsevier/Gold Standard. (2013-05-24 22:21:02) Ramucirumab injection What is this medicine? RAMUCIRUMAB (ra mue SIR ue mab) is a chemotherapy drug. It is used to treat stomach cancer, colorectal cancer, or lung cancer. This drug targets a specific protein receptor on cancer cells and stops the cancer cells from growing. This medicine may be used for other purposes; ask your health care provider or pharmacist if you have questions. COMMON BRAND NAME(S): Cyramza What should I tell my health care provider before I take this medicine? They need to know if you have any of these conditions: -bleeding disorders -blood clots -heart disease, including heart failure, heart attack, or chest pain (angina) -high blood pressure -infection (especially a virus infection such as chickenpox, cold sores, or herpes) -protein in your urine -recent surgery -stroke -an unusual or allergic reaction to ramucirumab, other medicines, foods, dyes, or preservatives -pregnant or trying to get pregnant -breast-feeding How should I use this medicine? This medicine is for infusion into a vein. It is given by a health care professional in a hospital or clinic setting. Talk to your pediatrician regarding the use of this medicine in children. Special care may be needed. Overdosage: If you think you've  taken too much of this medicine contact a poison control center or emergency room at once. Overdosage: If you think you have taken too much of this medicine contact a poison control center or emergency room at once. NOTE: This medicine is only for you. Do not share this medicine with others. What if I miss a  dose? It is important not to miss your dose. Call your doctor or health care professional if you are unable to keep an appointment. What may interact with this medicine? Interactions have not been studied. This list may not describe all possible interactions. Give your health care provider a list of all the medicines, herbs, non-prescription drugs, or dietary supplements you use. Also tell them if you smoke, drink alcohol, or use illegal drugs. Some items may interact with your medicine. What should I watch for while using this medicine? Your condition will be monitored carefully while you are receiving this medicine. You will need to to check your blood pressure and have your blood and urine tested while you are taking this medicine. Your condition will be monitored carefully while you are receiving this medicine. This medicine may increase your risk to bruise or bleed. Call your doctor or health care professional if you notice any unusual bleeding. This medicine may rarely cause 'gastrointestinal perforation' (holes in the stomach, intestines or colon), a serious side effect requiring surgery to repair. This medicine should be started at least 28 days following major surgery and the site of the surgery should be totally healed. Check with your doctor before scheduling dental work or surgery while you are receiving this treatment. Talk to your doctor if you have recently had surgery or if you have a wound that has not healed. Do not become pregnant while taking this medicine or for 3 months after stopping it. Women should inform their doctor if they wish to become pregnant or think they might be pregnant. There is a potential for serious side effects to an unborn child. Talk to your health care professional or pharmacist for more information. What side effects may I notice from receiving this medicine? Side effects that you should report to your doctor or health care professional as soon as  possible: -allergic reactions like skin rash, itching or hives, breathing problems, swelling of the face, lips, or tongue -signs of infection - fever or chills, cough, sore throat -chest pain or chest tightness -confusion -dizziness -feeling faint or lightheaded, falls -severe abdominal pain -severe nausea, vomiting -signs and symptoms of bleeding such as bloody or black, tarry stools; red or dark-brown urine; spitting up blood or brown material that looks like coffee grounds; red spots on the skin; unusual bruising or bleeding from the eye, gums, or nose -signs and symptoms of a blood clot such as breathing problems; changes in vision; chest pain; severe, sudden headache; pain, swelling, warmth in the leg; trouble speaking; sudden numbness or weakness of the face, arm or leg -symptoms of a stroke: change in mental awareness, inability to talk or move one side of the body -trouble walking, dizziness, loss of balance or coordination Side effects that usually do not require medical attention (Report these to your doctor or health care professional if they continue or are bothersome.): -cold, clammy skin -constipation -diarrhea -headache -nausea, vomiting -stomach pain -unusually slow heartbeat -unusually weak or tired This list may not describe all possible side effects. Call your doctor for medical advice about side effects. You may report side effects to FDA at 1-800-FDA-1088. Where should  I keep my medicine? This drug is given in a hospital or clinic and will not be stored at home. NOTE: This sheet is a summary. It may not cover all possible information. If you have questions about this medicine, talk to your doctor, pharmacist, or health care provider.  2015, Elsevier/Gold Standard. 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 We will discontinue immunotherapy at this time. In the  interim we are and refer you to [facility] for second opinion regarding her disease and available treatments. Follow-up in 2 weeks to begin systemic chemotherapy

## 2015-02-12 NOTE — Telephone Encounter (Signed)
s.w. pt wife adn advised on Aug appt....emailed [facility] pt request to have onpro so they do not have to come back for the inj.Marland KitchenMarland KitchenMarland Kitchen

## 2015-02-12 NOTE — Progress Notes (Signed)
1.  Do you need a wheel chair?  No  2. On oxygen? Yes  3. Have you ever had any surgery in the body part being scanned?  No  4. Have you ever had any surgery on your brain or heart?                                                   Heart cath done on 09/08/12 w/ coranary angiogram  5. Have you ever had surgery on your eyes or ears?  No                 6. Do you have a pacemaker or defibrillator? No     7. Do you have a Neurostimulator?     No  8. Claustrophobic?  No  9. Any risk for metal in eyes?  No  10. Injury by bullet, buckshot, or shrapnel?   No  11. Stent?    No                                                                                                            12. Hx of Cancer?  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 Kidney or Liver disease?  No  14. Hx of Lupus, Rheumatoid Arthritis or Scleroderma?  No  15. IV Antibiotics or long term use of NSAIDS?  No  16. HX of Hypertension?  Yes  17. Diabetes?  No  18. Allergy to contrast?  No  19. Recent labs.  Labs drawn 02/12/15

## 2015-02-13 ENCOUNTER — Telehealth: Payer: Self-pay | Admitting: Internal Medicine

## 2015-02-13 NOTE — Telephone Encounter (Signed)
s.w. t wife and confirmed MD ok to having onpro.Marland KitchenMarland KitchenMarland KitchenMarland Kitchenpt ok and aware

## 2015-02-14 ENCOUNTER — Telehealth: Payer: Self-pay | Admitting: Internal Medicine

## 2015-02-14 ENCOUNTER — Other Ambulatory Visit: Payer: Self-pay | Admitting: *Deleted

## 2015-02-14 ENCOUNTER — Telehealth: Payer: Self-pay | Admitting: Medical Oncology

## 2015-02-14 MED ORDER — ISOSORBIDE MONONITRATE ER 30 MG PO TB24: 30.0000 mg | ORAL_TABLET | Freq: Every day | ORAL | Status: DC

## 2015-02-14 NOTE — Telephone Encounter (Signed)
pt wife called and needs to know if pt need to still do chemo prior to going to [facility]. I fwd msg to desk nurse.

## 2015-02-14 NOTE — Telephone Encounter (Signed)
Wife left message if pt need to keep appt on 18th including chemo. I called back and left a message that Jayvyn should keep all those appts, unless he goes to [facility] in interim and if so to call us.

## 2015-02-14 NOTE — Telephone Encounter (Signed)
Pt appt. With Dr. Sharlet Salina is 03/03/15'@10'$ :00. Medical records faxed. Pt is aware.

## 2015-02-17 ENCOUNTER — Telehealth: Payer: Self-pay | Admitting: Medical Oncology

## 2015-02-17 NOTE — Telephone Encounter (Signed)
[facility] appt is 8/23 , MRI  On 8/19. New chemo 8/18  Why does Mohamed want to start a new rx and he is sending him to [facility] for second opinion? she thinks he will be too sick from chemo to go to MRI  and [facility] . Per Julien Nordmann we will delay appts on 8/18 to wek of 8/29 ( after [facility] appt.)

## 2015-02-18 ENCOUNTER — Telehealth: Payer: Self-pay | Admitting: Internal Medicine

## 2015-02-18 NOTE — Telephone Encounter (Signed)
Spoke with patients wife and she is aware of the new  appointments per pof

## 2015-02-27 ENCOUNTER — Ambulatory Visit: Payer: Medicare Other | Admitting: Physician Assistant

## 2015-02-27 ENCOUNTER — Ambulatory Visit: Payer: Medicare Other

## 2015-02-27 ENCOUNTER — Other Ambulatory Visit: Payer: Medicare Other

## 2015-02-28 ENCOUNTER — Ambulatory Visit
Admission: RE | Admit: 2015-02-28 | Discharge: 2015-02-28 | Disposition: A | Discharge status: Home / Self Care | Payer: Medicare Other | Source: Ambulatory Visit | Attending: Radiation Oncology | Admitting: Radiation Oncology

## 2015-02-28 ENCOUNTER — Ambulatory Visit: Payer: Medicare Other | Admitting: Pulmonary Disease

## 2015-02-28 DIAGNOSIS — C7931 Secondary malignant neoplasm of brain: Secondary | ICD-10-CM

## 2015-02-28 MED ORDER — GADOBENATE DIMEGLUMINE 529 MG/ML IV SOLN
16.0000 mL | Freq: Once | INTRAVENOUS | Status: DC | PRN
Start: 2015-02-28 — End: 2015-03-01

## 2015-03-02 ENCOUNTER — Encounter: Payer: Self-pay | Admitting: Radiation Therapy

## 2015-03-03 ENCOUNTER — Ambulatory Visit: Payer: Self-pay | Admitting: Radiation Oncology

## 2015-03-03 NOTE — Addendum Note (Signed)
Encounter addended by: Kyung Rudd, MD on: 03/03/2015 11:07 AM<BR>     Documentation filed: Notes Section

## 2015-03-04 ENCOUNTER — Telehealth: Payer: Self-pay | Admitting: Internal Medicine

## 2015-03-04 NOTE — Telephone Encounter (Signed)
pt wife called to sched appt fu to [facility] appt...done

## 2015-03-05 ENCOUNTER — Ambulatory Visit
Admission: RE | Admit: 2015-03-05 | Discharge: 2015-03-05 | Disposition: A | Discharge status: Home / Self Care | Payer: Medicare Other | Source: Ambulatory Visit | Attending: Radiation Oncology | Admitting: Radiation Oncology

## 2015-03-05 ENCOUNTER — Ambulatory Visit (HOSPITAL_BASED_OUTPATIENT_CLINIC_OR_DEPARTMENT_OTHER): Payer: Medicare Other | Admitting: Physician Assistant

## 2015-03-05 ENCOUNTER — Telehealth: Payer: Self-pay | Admitting: *Deleted

## 2015-03-05 ENCOUNTER — Encounter: Payer: Self-pay | Admitting: Radiation Oncology

## 2015-03-05 ENCOUNTER — Telehealth: Payer: Self-pay | Admitting: Internal Medicine

## 2015-03-05 VITALS — BP 143/77 | HR 72 | Temp 97.7°F | Resp 20

## 2015-03-05 VITALS — BP 152/70 | HR 77 | Temp 98.1°F | Resp 18 | Ht 69.5 in | Wt 180.1 lb

## 2015-03-05 DIAGNOSIS — C7931 Secondary malignant neoplasm of brain: Secondary | ICD-10-CM | POA: Insufficient documentation

## 2015-03-05 DIAGNOSIS — C3411 Malignant neoplasm of upper lobe, right bronchus or lung: Secondary | ICD-10-CM

## 2015-03-05 DIAGNOSIS — Z510 Encounter for antineoplastic radiation therapy: Secondary | ICD-10-CM | POA: Insufficient documentation

## 2015-03-05 DIAGNOSIS — J418 Mixed simple and mucopurulent chronic bronchitis: Secondary | ICD-10-CM | POA: Insufficient documentation

## 2015-03-05 DIAGNOSIS — Z85118 Personal history of other malignant neoplasm of bronchus and lung: Secondary | ICD-10-CM | POA: Diagnosis present

## 2015-03-05 DIAGNOSIS — C3490 Malignant neoplasm of unspecified part of unspecified bronchus or lung: Secondary | ICD-10-CM | POA: Diagnosis not present

## 2015-03-05 MED ORDER — LORAZEPAM 1 MG PO TABS
1.0000 mg | ORAL_TABLET | Freq: Once | ORAL | Status: AC
  Administered 2015-03-05: 1 mg via ORAL
  Filled 2015-03-05: qty 1

## 2015-03-05 MED ORDER — SODIUM CHLORIDE 0.9 % IJ SOLN
10.0000 mL | Freq: Once | INTRAMUSCULAR | Status: AC
  Administered 2015-03-05: 10 mL via INTRAVENOUS

## 2015-03-05 MED ORDER — HYDROCODONE-ACETAMINOPHEN 7.5-325 MG PO TABS: 1.0000 | ORAL_TABLET | Freq: Four times a day (QID) | ORAL | Status: DC | PRN

## 2015-03-05 NOTE — Telephone Encounter (Signed)
Gave adn printed appt sched and avs for pt for Sept and OCT °

## 2015-03-05 NOTE — Progress Notes (Signed)
  Radiation Oncology         ([phone]) [phone] ________________________________  Name: [name] MRN: [id number]  Date: 03/05/2015  DOB: September 18, 1940  SIMULATION AND TREATMENT PLANNING NOTE  DIAGNOSIS:  Metastatic lung cancer with brain metastasis  NARRATIVE:  The patient was brought to the [place] suite.  Identity was confirmed.  All relevant records and images related to the planned course of therapy were reviewed.  The patient freely provided informed written consent to proceed with treatment after reviewing the details related to the planned course of therapy. The consent form was witnessed and verified by the simulation staff. Intravenous access was established for contrast administration. Then, the patient was set-up in a stable reproducible supine position for radiation therapy.  A relocatable thermoplastic stereotactic head frame was fabricated for precise immobilization.  CT images were obtained.  Surface markings were placed.  The CT images were loaded into the planning software and fused with the patient's targeting MRI scan.  Then the target and avoidance structures were contoured.  Treatment planning then occurred.  The radiation prescription was entered and confirmed.  I have requested 3D planning  I have requested a DVH of the following structures: Brain stem, brain, left eye, right I, lenses, optic chiasm, target volumes, uninvolved brain, and normal tissue.    PLAN:  The patient will receive 20 Gy in 1 fraction to 3 new intracranial metastases. One right superior parietal tumor has increased some in size, now measuring 4 mm. This will continue to be followed closely. This appears to represent a site of prior treatment so I do not favor retreating this lesion at this time.   Special treatment procedure The patient will be treated with a course of radiosurgery. This requires extremely precise localization of the target and sophisticated, labor intensive treatment planning  to achieve a highly focused radiation treatment plan. Due to the extra work involved in planning and delivery of such a procedure, this corresponds to a special treatment procedure.  This document serves as a record of services personally performed by Kyung Rudd, MD. It was created on his behalf by Arlyce Harman, a trained medical scribe. The creation of this record is based on the scribe's personal observations and the provider's statements to them. This document has been checked and approved by the attending provider. ________________________________  Jodelle Gross, MD, PhD

## 2015-03-05 NOTE — Progress Notes (Signed)
Radiation Oncology         [phone] ________________________________  Name: [name] MRN: [id number]  Date: 03/05/2015  DOB: 1940/09/05  Follow-Up Visit Note  CC: [name], MD  [name], MD  Diagnosis:   Metastatic Lung Cancer, Stage IV  Interval Since Last Radiation:  4 months   Narrative:  The patient returns today for routine follow-up.   Patient has consulted with [facility] and they plan to add him to a program after radiation treatment. He is not currently taking steroids. Reports nausea that began with chemotherapy and presents after eating. This nausea is unchanged. Notes that the nausea is worse in the evenings. Denies any balance changes. Denies headaches. Patient still has anti anxiety medication to help with treatment sessions.                               ALLERGIES:  has No Known Allergies.  Meds: Current Outpatient Prescriptions  Medication Sig Dispense Refill  . amLODipine (NORVASC) 10 MG tablet Take 1 tablet (10 mg total) by mouth daily. 90 tablet 3  . aspirin EC 81 MG tablet Take 81 mg by mouth daily.     . clonazePAM (KLONOPIN) 0.5 MG tablet Take 0.5 mg by mouth 2 (two) times daily as needed for anxiety. Takes one tablet by mouth two times daily as needed for anxiety.    Marland Kitchen ipratropium-albuterol (DUONEB) 0.5-2.5 (3) MG/3ML SOLN Take 3 mLs by nebulization 4 (four) times daily. (Patient taking differently: Take 3 mLs by nebulization 4 (four) times daily. Pt takes nebulizer 2 to 3 times daily.) 360 mL 2  . isosorbide mononitrate (IMDUR) 30 MG 24 hr tablet Take 1 tablet (30 mg total) by mouth daily. 30 tablet 3  . LORazepam (ATIVAN) 0.5 MG tablet Take 1 tablet by mouth or sublingually every 8 hours as needed for nausea. May take 1 tablet by mouth at bedtime. 40 tablet 0  . metoprolol tartrate (LOPRESSOR) 25 MG tablet Take 25 mg by mouth 2 (two) times daily.     Marland Kitchen omeprazole (PRILOSEC) 40 MG capsule Take 40 mg by mouth daily.    . pramipexole (MIRAPEX)  1 MG tablet Take 1 mg by mouth at bedtime.    . traMADol (ULTRAM) 50 MG tablet Take 1 tablet (50 mg total) by mouth every 12 (twelve) hours as needed. 30 tablet 0  . zolpidem (AMBIEN) 10 MG tablet Take 10 mg by mouth at bedtime as needed for sleep.    . budesonide (PULMICORT FLEXHALER) 180 MCG/ACT inhaler Inhale 2 puffs into the lungs 2 (two) times daily. (Patient not taking: Reported on 03/05/2015) 1 Inhaler 4  . budesonide (PULMICORT FLEXHALER) 180 MCG/ACT inhaler Inhale 2 puffs into the lungs 2 (two) times daily. 1 Inhaler 0  . HYDROcodone-acetaminophen (NORCO) 7.5-325 MG per tablet Take 1 tablet by mouth every 6 (six) hours as needed for moderate pain. 30 tablet 0  . potassium chloride SA (K-DUR,KLOR-CON) 20 MEQ tablet Take 1 tablet (20 mEq total) by mouth 2 (two) times daily. (Patient not taking: Reported on 01/17/2015) 14 tablet 0   No current facility-administered medications for this encounter.   Facility-Administered Medications Ordered in Other Encounters  Medication Dose Route Frequency Provider Last Rate Last Dose  . sodium chloride 0.9 % injection 10 mL  10 mL Intracatheter PRN Curt Bears, MD   10 mL at 01/29/15 5093    Physical Findings: The patient is  in no acute distress. Patient is alert and oriented.  oral temperature is 97.7 F (36.5 C). His blood pressure is 143/77 and his pulse is 72. His respiration is 20 and oxygen saturation is 100%. .     Lab Findings: Lab Results  Component Value Date   WBC 6.1 02/12/2015   HGB 11.9* 02/12/2015   HCT 35.7* 02/12/2015   MCV 92.6 02/12/2015   PLT 290 02/12/2015     Radiographic Findings: Ct Chest W Contrast  02/10/2015   CLINICAL DATA:  Followup lung cancer.  EXAM: CT CHEST, ABDOMEN, AND PELVIS WITH CONTRAST  TECHNIQUE: Multidetector CT imaging of the chest, abdomen and pelvis was performed following the standard protocol during bolus administration of intravenous contrast.  CONTRAST:  149m OMNIPAQUE IOHEXOL 300 MG/ML  SOLN   COMPARISON:  12/06/2014  FINDINGS: CT CHEST FINDINGS  Mediastinum: The heart size appears normal. Aortic atherosclerosis identified. The trachea is patent and midline. On unremarkable appearance of the esophagus. Index right subpectoral lymph node measures 7 mm, image 10/series 2. Unchanged from previous exam. Index right supraclavicular lymph node measures 11 mm, image 4/ series 2. Previously 13 mm. Index right axillary node measures 1.2 cm, image 16/series 2. Previously this measured the same. There is a right paratracheal lymph node which measures 8 mm, image 20/series 2. New from previous exam.  Lungs/Pleura: No pleural effusion identified. There is mild pleural thickening within the posterior right lung base. Diffuse increased interstitial markings and pulmonary nodularity noted bilaterally. Index nodule within the right upper lobe measures 8 mm, image 13/ series 4. Unchanged from previous exam. Subpleural nodule within the left lower lobe measures 1.2 cm, image 43/series 4. Previously 8 mm. Subpleural nodularity within the posterior left lung base measures 2.4 cm, image 51/series 4. Previously 2.6 cm. There is a 7 mm nodule within the posterior right lung base, image 41/series 4. Previously 8 mm.  Musculoskeletal: No aggressive lytic or sclerotic bone lesions. Chronic healed right posterior rib fracture deformities identified. There are no aggressive lytic or sclerotic bone lesions.  CT ABDOMEN AND PELVIS FINDINGS  Hepatobiliary: No suspicious liver abnormalities identified. The gallbladder appears normal. No biliary dilatation.  Pancreas: The pancreas appears normal.  Spleen: Normal appearance of the spleen.  Adrenals/Urinary Tract: The left adrenal gland appears normal. Right adrenal nodule measures 2.2 x 1.5 cm, image 57/series 2. Previously 1.8 x 1.1 cm. 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 This now measures 6.4 cm and appears to obstruct the right upper pole calyx, image 62/ series 2.  Previously this measured 2.7 cm. The left kidney appears normal. Urinary bladder appears normal.  Stomach/Bowel: The stomach and the small bowel loops have a normal course and caliber. The appendix is visualized and appears normal. Normal appearance of the colon.  Vascular/Lymphatic: Calcified atherosclerotic disease involves the abdominal aorta. No aneurysm. The abdominal aorta measures 2.4 cm in AP dimension. No upper abdominal adenopathy. There is no pelvic or inguinal adenopathy.  Reproductive: Prostate gland and seminal vesicles are unremarkable.  Other: No free fluid or fluid collections within the abdomen or pelvis.  Musculoskeletal: There is no aggressive lytic or sclerotic bone lesion. Scoliosis deformity is noted. There is multi level degenerative disc disease identified.  IMPRESSION: 1. Interval mixed response to therapy. 2. 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 Additionally, there are several pulmonary nodules which are larger as well as a enlarging right paratracheal lymph node. No significant change in  right supraclavicular and right axillary adenopathy. 3. Aortic atherosclerosis. 4. Scoliosis and degenerative disc disease.   Electronically Signed   By: Kerby Moors M.D.   On: 02/10/2015 08:53   Mr Jeri Cos WU Contrast  02/28/2015   CLINICAL DATA:  S RS restaging.  Metastatic lung cancer.  EXAM: MRI HEAD WITHOUT AND WITH CONTRAST  TECHNIQUE: Multiplanar, multiecho pulse sequences of the brain and surrounding structures were obtained without and with intravenous contrast.  CONTRAST:  16 cc MultiHance  COMPARISON:  11/22/2014.  09/17/2014.  08/08/2014.  08/03/2012.  FINDINGS: There is a background pattern of chronic small vessel ischemic changes affecting the pons an the cerebral hemispheric deep white matter. No cortical or large vessel territory infarction.  There is a newly seen 10 mm metastasis in the right posterior lateral cerebellum.  Mild surrounding edema without mass effect or hemorrhage. No other posterior fossa lesion.  There is a new 4-5 mm metastasis at the inferior right frontal lobe without edema or mass effect.  Left frontal metastasis measuring 2-3 mm is again demonstrated, possibly minimally larger.  Newly seen 2 mm left parietal metastasis axial image 122.  Enlargement of the right parietal metastasis now measuring 4 mm in diameter, image 127.  No hydrocephalus. No extra-axial collection. No skull or skullbase lesion. Sinuses are clear.  IMPRESSION: Worsening of intracranial metastatic disease. 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 New 4-5 mm inferior right frontal metastasis. New 2 mm left parietal metastasis. Enlargement of previously seen right parietal metastasis, now measuring 4 mm. Similar or minimally enlarged 2-3 mm left frontal metastasis.   Electronically Signed   By: Nelson Chimes M.D.   On: 02/28/2015 13:49   Ct Abdomen Pelvis W Contrast  02/10/2015   CLINICAL DATA:  Followup lung cancer.  EXAM: CT CHEST, ABDOMEN, AND PELVIS WITH CONTRAST  TECHNIQUE: Multidetector CT imaging of the chest, abdomen and pelvis was performed following the standard protocol during bolus administration of intravenous contrast.  CONTRAST:  188m OMNIPAQUE IOHEXOL 300 MG/ML  SOLN  COMPARISON:  12/06/2014  FINDINGS: CT CHEST FINDINGS  Mediastinum: The heart size appears normal. Aortic atherosclerosis identified. The trachea is patent and midline. On unremarkable appearance of the esophagus. Index right subpectoral lymph node measures 7 mm, image 10/series 2. Unchanged from previous exam. Index right supraclavicular lymph node measures 11 mm, image 4/ series 2. Previously 13 mm. Index right axillary node measures 1.2 cm, image 16/series 2. Previously this measured the same. There is a right paratracheal lymph node which measures 8 mm, image 20/series 2. New from previous exam.  Lungs/Pleura: No pleural effusion identified. There is mild pleural  thickening within the posterior right lung base. Diffuse increased interstitial markings and pulmonary nodularity noted bilaterally. Index nodule within the right upper lobe measures 8 mm, image 13/ series 4. Unchanged from previous exam. Subpleural nodule within the left lower lobe measures 1.2 cm, image 43/series 4. Previously 8 mm. Subpleural nodularity within the posterior left lung base measures 2.4 cm, image 51/series 4. Previously 2.6 cm. There is a 7 mm nodule within the posterior right lung base, image 41/series 4. Previously 8 mm.  Musculoskeletal: No aggressive lytic or sclerotic bone lesions. Chronic healed right posterior rib fracture deformities identified. There are no aggressive lytic or sclerotic bone lesions.  CT ABDOMEN AND PELVIS FINDINGS  Hepatobiliary: No suspicious liver abnormalities identified. The gallbladder appears normal. No biliary dilatation.  Pancreas: The pancreas appears normal.  Spleen: Normal appearance of the spleen.  Adrenals/Urinary Tract: The  left adrenal gland appears normal. Right adrenal nodule measures 2.2 x 1.5 cm, image 57/series 2. Previously 1.8 x 1.1 cm. 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 This now measures 6.4 cm and appears to obstruct the right upper pole calyx, image 62/ series 2. Previously this measured 2.7 cm. The left kidney appears normal. Urinary bladder appears normal.  Stomach/Bowel: The stomach and the small bowel loops have a normal course and caliber. The appendix is visualized and appears normal. Normal appearance of the colon.  Vascular/Lymphatic: Calcified atherosclerotic disease involves the abdominal aorta. No aneurysm. The abdominal aorta measures 2.4 cm in AP dimension. No upper abdominal adenopathy. There is no pelvic or inguinal adenopathy.  Reproductive: Prostate gland and seminal vesicles are unremarkable.  Other: No free fluid or fluid collections within the abdomen or pelvis.  Musculoskeletal: There is no aggressive lytic  or sclerotic bone lesion. Scoliosis deformity is noted. There is multi level degenerative disc disease identified.  IMPRESSION: 1. Interval mixed response to therapy. 2. 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 Additionally, there are several pulmonary nodules which are larger as well as a enlarging right paratracheal lymph node. No significant change in right supraclavicular and right axillary adenopathy. 3. Aortic atherosclerosis. 4. Scoliosis and degenerative disc disease.   Electronically Signed   By: Kerby Moors M.D.   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 Patient is a good candidate to proceed with radiosurgery to these lesions.   Plan:  We discussed the patient's case at the multidisciplinary brain conference on Monday.  We will continue with simulation and planning treatment later today. I have prescribed 1 mg Ativan to be taken for anxiety with simulation for treatment planning purposes and we will proceed with his single fraction of radiosurgery next week. The patient had mild edema with no change in symptoms recently in terms of headache or nausea. I have not given him a prescription for Decadron at this time. We will order this if he develops symptoms. They do not anticipate undergoing any further immunotherapy.  Jodelle Gross, M.D., Ph.D.   This document serves as a record of services personally performed by Kyung Rudd, MD. It was created on his behalf by Arlyce Harman, a trained medical scribe. The creation of this record is based on the scribe's personal observations and the provider's statements to them. This document has been checked and approved by the attending provider.

## 2015-03-05 NOTE — Progress Notes (Signed)
Ativan '1mg'$  given  X 1 SL to patient as ordered and written by Dr. Lisbeth Renshaw, labs from 02/12/15: BUN=6.2,CR=0.8, nka, not diabetic, Samantha started IV in [facility]! Attempt, excellent blood return, patient ready for CT simulation called and spoke with Alm Bustard, RT therapist, patient instructed no driving today ,wife and patient gave verbal understand 1:03 PM

## 2015-03-05 NOTE — Telephone Encounter (Signed)
Attempted IV  Start right wrist,execllent blood return but when flushing normal saline,vein blown, placed 2x2 gause over site and taped,secured, askedSamantha RN to try

## 2015-03-07 DIAGNOSIS — Z510 Encounter for antineoplastic radiation therapy: Secondary | ICD-10-CM | POA: Diagnosis not present

## 2015-03-10 ENCOUNTER — Other Ambulatory Visit: Payer: Medicare Other

## 2015-03-10 ENCOUNTER — Ambulatory Visit: Payer: Medicare Other

## 2015-03-10 ENCOUNTER — Ambulatory Visit: Payer: Medicare Other | Admitting: Physician Assistant

## 2015-03-10 ENCOUNTER — Ambulatory Visit: Payer: Medicare Other | Admitting: Radiation Oncology

## 2015-03-10 DIAGNOSIS — Z510 Encounter for antineoplastic radiation therapy: Secondary | ICD-10-CM | POA: Diagnosis not present

## 2015-03-12 ENCOUNTER — Encounter: Payer: Self-pay | Admitting: Radiation Oncology

## 2015-03-12 ENCOUNTER — Ambulatory Visit
Admission: RE | Admit: 2015-03-12 | Discharge: 2015-03-12 | Disposition: A | Discharge status: Home / Self Care | Payer: Medicare Other | Source: Ambulatory Visit | Attending: Radiation Oncology | Admitting: Radiation Oncology

## 2015-03-12 VITALS — BP 168/75 | HR 82 | Temp 98.4°F | Resp 20

## 2015-03-12 DIAGNOSIS — C7931 Secondary malignant neoplasm of brain: Secondary | ICD-10-CM

## 2015-03-12 DIAGNOSIS — Z510 Encounter for antineoplastic radiation therapy: Secondary | ICD-10-CM | POA: Diagnosis not present

## 2015-03-12 NOTE — Procedures (Signed)
  Name: [name]  MRN: [id number]  Date: 03/12/2015   DOB: 1941-02-22  Stereotactic Radiosurgery Operative Note  PRE-OPERATIVE DIAGNOSIS:  Multiple Brain Metastases  POST-OPERATIVE DIAGNOSIS:  Multiple Brain Metastases  PROCEDURE:  Stereotactic Radiosurgery  SURGEON:  Neythan Kozlov P, MD  NARRATIVE: The patient underwent a radiation treatment planning session in the radiation oncology simulation suite under the care of the radiation oncology physician and physicist.  I participated closely in the radiation treatment planning afterwards. The patient underwent planning CT which was fused to 3T high resolution MRI with 1 mm axial slices.  These images were fused on the planning system.  We contoured the gross target volumes and subsequently expanded this to yield the Planning Target Volume. I actively participated in the planning process.  I helped to define and review the target contours and also the contours of the optic pathway, eyes, brainstem and selected nearby organs at risk.  All the dose constraints for critical structures were reviewed and compared to AAPM Task Group 101.  The prescription dose conformity was reviewed.  I approved the plan electronically.    Accordingly, [name] was brought to the TrueBeam stereotactic radiation treatment linac and placed in the custom immobilization mask.  The patient was aligned according to the IR fiducial markers with [facility] Exactrac, then orthogonal x-rays were used in ExacTrac with the 6DOF robotic table and the shifts were made to align the patient  [name] received stereotactic radiosurgery uneventfully.    Lesions treated:  3   Complex lesions treated:  0 (>3.5 cm, <14m of optic path, or within the brainstem)   The detailed description of the procedure is recorded in the radiation oncology procedure note.  I was present for the duration of the procedure.  DISPOSITION:  Following delivery, the patient was transported to  nursing in stable condition and monitored for possible acute effects to be discharged to home in stable condition with follow-up in one month.  [name] P, MD 03/12/2015 4:57 PM

## 2015-03-12 NOTE — Progress Notes (Addendum)
[place] [phone]   [phone]  OFFICE PROGRESS NOTE  [name], MD [address] [place] [address]  DIAGNOSIS: Metastatic non-small cell lung cancer, squamous cell carcinoma initially diagnosed as Stage IIA (T2b., N0, M0) non-small cell lung cancer, squamous cell carcinoma diagnosed in December of 2013   PRIOR THERAPY:  1) Status post right upper lobectomy under the care of Dr. Prescott Gum on 09/14/2012.  2) Adjuvant chemotherapy with cisplatin 75 mg/M2 and docetaxel 75 mg/M2 with Neulasta support every 3 weeks, status post 1 cycle. Starting with cycle #2, patient will receive carboplatin for an AUC initially of 4.5 and paclitaxel at 175 mg per meter squared with Neulasta support given every 3 weeks. Status post a total of 3 cycles.  3) Systemic chemotherapy with carboplatin for AUC of 5 on day 1 and gemcitabine 1000 MG/M2 on days 1 and 8 every 3 weeks. First dose 09/11/2014. Status post 4 cycles. Discontinued secondary to intolerance 4) Immunotherapy with Nivolumab 3 MG/KG every 2 weeks. Status post 4 cycles.  CURRENT THERAPY: Systemic chemotherapy with docetaxel '75mg'$ /m2 and Cyramza 10 mg/kg  CHEMOTHERAPY INTENT: Palliative. CURRENT # OF CHEMOTHERAPY CYCLES:  CURRENT ANTIEMETICS: Zofran, dexamethasone and Compazine  CURRENT SMOKING STATUS: Quit smoking recently.  ORAL CHEMOTHERAPY AND CONSENT: None  CURRENT BISPHOSPHONATES USE: None  PAIN MANAGEMENT: 3/10 controlled by Norco when necessary  NARCOTICS INDUCED CONSTIPATION: Milk of magnesia on as-needed basis.  LIVING WILL AND CODE STATUS: No CODE BLUE.   INTERVAL HISTORY:  [name] 74 y.o. male returns to the clinic today for follow up visit, accompanied his wife. He was recently evaluated at [facility] for other treatment options. He was offered a clinical research trial but wanted Dr. Worthy Flank opinion first. He was to start systemic chemotherapy with docetaxel and  Cyramza however, that is on hold until his eligibility for the clinical trial is determined.  He reports less fatigue but continues to have cough and shortness of breath with exertion.  He continues on oxygen continuously. He denies chest pain, or hemoptysis. He denies fevers, chills, nausea or vomiting.  A detailed review of systems is otherwise stable.   MEDICAL HISTORY: Past Medical History  Diagnosis Date  . Arthritis   . Wheezing   . Sore throat   . Carotid artery occlusion   . COPD (chronic obstructive pulmonary disease)   . Lumbar disc disease   . Restless leg syndrome   . Allergic rhinitis   . Hypertension   . Anxiety     anxiety  . Shortness of breath   . Lung mass   . GERD (gastroesophageal reflux disease)   . H/O hiatal hernia   . Pre-operative cardiovascular examination   . Nonspecific abnormal unspecified cardiovascular function study   . Peripheral vascular disease, unspecified   . Pneumonia 2014    ?   Marland Kitchen On home oxygen therapy     prn  . Constipation   . Cancer 09/14/12    invasive mod diff squamous cell ca  . 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 S/P radiation therapy 12/04/14 srs    lt frontal brain    ALLERGIES:  has No Known Allergies.  MEDICATIONS:  Current Outpatient Prescriptions  Medication Sig Dispense Refill  . amLODipine (NORVASC) 10 MG tablet Take 1 tablet (10 mg total) by mouth daily. 90 tablet 3  . aspirin EC 81 MG tablet Take 81  mg by mouth daily.     . budesonide (PULMICORT FLEXHALER) 180 MCG/ACT inhaler Inhale 2 puffs into the lungs 2 (two) times daily. (Patient not taking: Reported on 03/05/2015) 1 Inhaler 4  . budesonide (PULMICORT FLEXHALER) 180 MCG/ACT inhaler Inhale 2 puffs into the lungs 2 (two) times daily. 1 Inhaler 0  . clonazePAM (KLONOPIN) 0.5 MG tablet Take 0.5 mg by mouth 2 (two) times daily as needed for anxiety. Takes one tablet by mouth two times daily as needed for anxiety.    Marland Kitchen HYDROcodone-acetaminophen (NORCO)  7.5-325 MG per tablet Take 1 tablet by mouth every 6 (six) hours as needed for moderate pain. 30 tablet 0  . ipratropium-albuterol (DUONEB) 0.5-2.5 (3) MG/3ML SOLN Take 3 mLs by nebulization 4 (four) times daily. (Patient taking differently: Take 3 mLs by nebulization 4 (four) times daily. Pt takes nebulizer 2 to 3 times daily.) 360 mL 2  . isosorbide mononitrate (IMDUR) 30 MG 24 hr tablet Take 1 tablet (30 mg total) by mouth daily. 30 tablet 3  . LORazepam (ATIVAN) 0.5 MG tablet Take 1 tablet by mouth or sublingually every 8 hours as needed for nausea. May take 1 tablet by mouth at bedtime. 40 tablet 0  . metoprolol tartrate (LOPRESSOR) 25 MG tablet Take 25 mg by mouth 2 (two) times daily.     Marland Kitchen omeprazole (PRILOSEC) 40 MG capsule Take 40 mg by mouth daily.    . potassium chloride SA (K-DUR,KLOR-CON) 20 MEQ tablet Take 1 tablet (20 mEq total) by mouth 2 (two) times daily. (Patient not taking: Reported on 01/17/2015) 14 tablet 0  . pramipexole (MIRAPEX) 1 MG tablet Take 1 mg by mouth at bedtime.    . traMADol (ULTRAM) 50 MG tablet Take 1 tablet (50 mg total) by mouth every 12 (twelve) hours as needed. 30 tablet 0  . zolpidem (AMBIEN) 10 MG tablet Take 10 mg by mouth at bedtime as needed for sleep.     No current facility-administered medications for this visit.   Facility-Administered Medications Ordered in Other Visits  Medication Dose Route Frequency Provider Last Rate Last Dose  . sodium chloride 0.9 % injection 10 mL  10 mL Intracatheter PRN Curt Bears, MD   10 mL at 01/29/15 0944    SURGICAL HISTORY:  Past Surgical History  Procedure Laterality Date  . Carotid endarterectomy  10/15/2010    left  . Hand surgery      repair of the left long, ring, and small finger after a saw injury  . Video bronchoscopy  07/24/2012    Procedure: VIDEO BRONCHOSCOPY WITH FLUORO;  Surgeon: Kathee Delton, MD;  Location: [facility];  Service: Cardiopulmonary;  Laterality: Bilateral;  . Lobectomy Right  09/14/2012    Procedure: LOBECTOMY;  Surgeon: Ivin Poot, MD;  Location: [facility];  Service: Thoracic;  Laterality: Right;  RIGHT UPPER LOBECTOMY  . Video assisted thoracoscopy Right 09/14/2012    Procedure: VIDEO ASSISTED THORACOSCOPY;  Surgeon: Ivin Poot, MD;  Location: [facility];  Service: Thoracic;  Laterality: Right;  . Video bronchoscopy with endobronchial ultrasound N/A 08/27/2014    Procedure: VIDEO BRONCHOSCOPY WITH ENDOBRONCHIAL ULTRASOUND;  Surgeon: Ivin Poot, MD;  Location: [facility];  Service: Thoracic;  Laterality: N/A;  . Scalene node biopsy Right 08/27/2014    Procedure: BIOPSY SCALENE NODE;  Surgeon: Ivin Poot, MD;  Location: [facility];  Service: Thoracic;  Laterality: Right;  . Sterotactic radiosurgery Right 09/25/14    right parietal 30Gy/1 fx  REVIEW OF SYSTEMS:  Constitutional: positive for fatigue Eyes: negative Ears, nose, mouth, throat, and face: negative Respiratory: positive for cough, dyspnea on exertion and sputum Cardiovascular: negative Gastrointestinal: negative Genitourinary:negative Integument/breast: negative Hematologic/lymphatic: negative Musculoskeletal:negative Neurological: negative Behavioral/Psych: negative Endocrine: negative Allergic/Immunologic: negative   PHYSICAL EXAMINATION: General appearance: alert, cooperative and no distress Head: Normocephalic, without obvious abnormality, atraumatic Neck: no adenopathy, no JVD, supple, symmetrical, trachea midline and thyroid not enlarged, symmetric, no tenderness/mass/nodules Lymph nodes: Cervical, supraclavicular, and axillary nodes normal. Resp: wheezes bilaterally Back: symmetric, no curvature. ROM normal. No CVA tenderness. Cardio: regular rate and rhythm, S1, S2 normal, no murmur, click, rub or gallop GI: soft, non-tender; bowel sounds normal; no masses,  no organomegaly Extremities: extremities normal, atraumatic, no cyanosis or edema Neurologic: Alert and oriented X 3, normal strength  and tone. Normal symmetric reflexes. Normal coordination and gait  ECOG PERFORMANCE STATUS: 1 - Symptomatic but completely ambulatory  Blood pressure 152/70, pulse 77, temperature 98.1 F (36.7 C), temperature source Oral, resp. rate 18, height 5' 9.5" (1.765 m), weight 180 lb 1.6 oz (81.693 kg), SpO2 93 %.  LABORATORY DATA: Lab Results  Component Value Date   WBC 6.1 02/12/2015   HGB 11.9* 02/12/2015   HCT 35.7* 02/12/2015   MCV 92.6 02/12/2015   PLT 290 02/12/2015      Chemistry      Component Value Date/Time   NA 135* 02/12/2015 1322   NA 138 12/05/2014 0618   K 3.4* 02/12/2015 1322   K 3.4* 12/05/2014 0618   CL 95* 12/05/2014 0618   CL 101 12/28/2012 0852   CO2 30* 02/12/2015 1322   CO2 32 12/05/2014 0618   BUN 6.2* 02/12/2015 1322   BUN 9 12/05/2014 0618   CREATININE 0.8 02/12/2015 1322   CREATININE 0.52* 12/05/2014 0618      Component Value Date/Time   CALCIUM 9.0 02/12/2015 1322   CALCIUM 9.2 12/05/2014 0618   ALKPHOS 89 02/12/2015 1322   ALKPHOS 71 12/05/2014 0618   AST 17 02/12/2015 1322   AST 24 12/05/2014 0618   ALT 17 02/12/2015 1322   ALT 30 12/05/2014 0618   BILITOT 0.24 02/12/2015 1322   BILITOT 0.2* 12/05/2014 0618       RADIOGRAPHIC STUDIES: Mr Jeri Cos Wo Contrast  03-16-15   CLINICAL DATA:  S RS restaging.  Metastatic lung cancer.  EXAM: MRI HEAD WITHOUT AND WITH CONTRAST  TECHNIQUE: Multiplanar, multiecho pulse sequences of the brain and surrounding structures were obtained without and with intravenous contrast.  CONTRAST:  16 cc MultiHance  COMPARISON:  11/22/2014.  09/17/2014.  08/08/2014.  08/03/2012.  FINDINGS: There is a background pattern of chronic small vessel ischemic changes affecting the pons an the cerebral hemispheric deep white matter. No cortical or large vessel territory infarction.  There is a newly seen 10 mm metastasis in the right posterior lateral cerebellum. Mild surrounding edema without mass effect or hemorrhage. No other  posterior fossa lesion.  There is a new 4-5 mm metastasis at the inferior right frontal lobe without edema or mass effect.  Left frontal metastasis measuring 2-3 mm is again demonstrated, possibly minimally larger.  Newly seen 2 mm left parietal metastasis axial image 122.  Enlargement of the right parietal metastasis now measuring 4 mm in diameter, image 127.  No hydrocephalus. No extra-axial collection. No skull or skullbase lesion. Sinuses are clear.  IMPRESSION: Worsening of intracranial metastatic disease. 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 New 4-5 mm inferior right frontal metastasis. New  2 mm left parietal metastasis. Enlargement of previously seen right parietal metastasis, now measuring 4 mm. Similar or minimally enlarged 2-3 mm left frontal metastasis.   Electronically Signed   By: Nelson Chimes M.D.   On: 02/28/2015 13:49    ASSESSMENT AND PLAN: This is a very pleasant 74 years old white male with history of stage IIA non-small cell lung cancer status post right upper lobectomy followed by 4 cycles of adjuvant chemotherapy.  The patient underwent systemic chemotherapy with carboplatin and gemcitabine status post 4 cycles discontinued secondary to intolerance and mild disease progression.  The patient is currently on treatment with Nivolumab status post 4 cycles and tolerating this treatment fairly well. The labs were reviewed in detail and were stable. His recent restaging CT scan revealed stable disease within the chest however evidence for disease progression involving the right adrenal metastasis and right kidney metastasis. Patient was discussed with and also seen by [name]. As he has evidence for disease progression, we will discontinue treatment with nivoulmab. He was referred to [facility] for second opinion regarding his disease and offered a clinical research trial. We have the same clinical trial at our facility. He will follow up in 2 weeks for re-evaluation/reassessment.  He was  advised to call immediately if he has any concerning symptoms in the interval.  The patient voices understanding of current disease status and treatment options and is in agreement with the current care plan.  All questions were answered. The patient knows to call the clinic with any problems, questions or concerns. We can certainly see the patient much sooner if necessary.  Carlton Adam, PA-C 03/12/2015 4:14 PM  ADDENDUM: Hematology/Oncology Attending: I had a face to face encounter with the patient. I recommended his care plan. This is a very pleasant 74 years old white male with metastatic non-small cell lung cancer, squamous cell carcinoma status post several chemotherapy regimen including carboplatin and gemcitabine followed by Nivolumab but unfortunately continues to have evidence for disease progression. The patient was referred to [facility] for second opinion and they are considering him for treatment according to the swab 1400S clinical trial. He consented for the trial but he still awaiting the treatment of the metastatic brain lesion. The wife is interested in receiving treatment on the trial but close to home. We also have the same clinical trial but unfortunately our clinical research is currently on hold for a short period to catch up with referral. I explained to the patient and his wife that would be happy to consider him for treatment here in [place] once we open for enrollment patient clinical trials which may take a few more weeks. If the patient is ineligible for the clinical trial, I would consider him for treatment with docetaxel and Cyramza. He will come back for follow-up visit in 2 weeks for reevaluation. The patient was advised to call immediately if he has any concerning symptoms in the interval.  Disclaimer: This note was dictated with voice recognition software. Similar sounding words can inadvertently be transcribed and may be  missed upon review.  Eilleen Kempf., MD 03/12/2015

## 2015-03-12 NOTE — Progress Notes (Signed)
[name] here for post [facility] monitoring.  He denies pain, headache, dizziness and nausea.  He does report having increasing worse blurry vision for the past month and a half.

## 2015-03-12 NOTE — Patient Instructions (Signed)
You are being considered for a clinical trial Follow up in 2 weeks

## 2015-03-13 ENCOUNTER — Ambulatory Visit: Payer: Medicare Other

## 2015-03-13 ENCOUNTER — Telehealth: Payer: Self-pay | Admitting: Internal Medicine

## 2015-03-13 ENCOUNTER — Ambulatory Visit: Payer: Medicare Other | Admitting: Physician Assistant

## 2015-03-13 ENCOUNTER — Other Ambulatory Visit: Payer: Medicare Other

## 2015-03-13 NOTE — Telephone Encounter (Signed)
returned call and s.w. pt wife and transferred to radiation

## 2015-03-14 ENCOUNTER — Ambulatory Visit: Payer: Medicare Other | Admitting: Radiation Oncology

## 2015-03-19 ENCOUNTER — Telehealth: Payer: Self-pay | Admitting: Internal Medicine

## 2015-03-19 NOTE — Telephone Encounter (Signed)
Moved 9/29 appointment from [place] to [place]. Spoke with patient wife re change and new time. Patient to get new schedule tomorrow.

## 2015-03-20 ENCOUNTER — Encounter: Payer: Self-pay | Admitting: Internal Medicine

## 2015-03-20 ENCOUNTER — Ambulatory Visit (HOSPITAL_BASED_OUTPATIENT_CLINIC_OR_DEPARTMENT_OTHER): Payer: Medicare Other | Admitting: Internal Medicine

## 2015-03-20 ENCOUNTER — Other Ambulatory Visit (HOSPITAL_BASED_OUTPATIENT_CLINIC_OR_DEPARTMENT_OTHER): Payer: Medicare Other

## 2015-03-20 ENCOUNTER — Ambulatory Visit: Payer: Medicare Other

## 2015-03-20 VITALS — BP 160/85 | HR 80 | Temp 98.4°F | Resp 18 | Ht 69.5 in | Wt 176.9 lb

## 2015-03-20 DIAGNOSIS — C3411 Malignant neoplasm of upper lobe, right bronchus or lung: Secondary | ICD-10-CM | POA: Diagnosis present

## 2015-03-20 DIAGNOSIS — Z79899 Other long term (current) drug therapy: Secondary | ICD-10-CM | POA: Diagnosis not present

## 2015-03-20 DIAGNOSIS — C7931 Secondary malignant neoplasm of brain: Secondary | ICD-10-CM

## 2015-03-20 DIAGNOSIS — Z85118 Personal history of other malignant neoplasm of bronchus and lung: Secondary | ICD-10-CM

## 2015-03-20 DIAGNOSIS — C3410 Malignant neoplasm of upper lobe, unspecified bronchus or lung: Secondary | ICD-10-CM

## 2015-03-20 LAB — CBC WITH DIFFERENTIAL/PLATELET
BASO%: 0.2 % (ref 0.0–2.0)
Basophils Absolute: 0 10*3/uL (ref 0.0–0.1)
EOS%: 2.1 % (ref 0.0–7.0)
Eosinophils Absolute: 0.1 10*3/uL (ref 0.0–0.5)
HCT: 39.5 % (ref 38.4–49.9)
HEMOGLOBIN: 13.1 g/dL (ref 13.0–17.1)
LYMPH#: 1.5 10*3/uL (ref 0.9–3.3)
LYMPH%: 26.7 % (ref 14.0–49.0)
MCH: 31.2 pg (ref 27.2–33.4)
MCHC: 33.2 g/dL (ref 32.0–36.0)
MCV: 94 fL (ref 79.3–98.0)
MONO#: 0.6 10*3/uL (ref 0.1–0.9)
MONO%: 10.5 % (ref 0.0–14.0)
NEUT%: 60.5 % (ref 39.0–75.0)
NEUTROS ABS: 3.5 10*3/uL (ref 1.5–6.5)
PLATELETS: 219 10*3/uL (ref 140–400)
RBC: 4.2 10*6/uL (ref 4.20–5.82)
RDW: 14.8 % — AB (ref 11.0–14.6)
WBC: 5.7 10*3/uL (ref 4.0–10.3)

## 2015-03-20 LAB — COMPREHENSIVE METABOLIC PANEL (CC13)
ALT: 14 U/L (ref 0–55)
ANION GAP: 10 meq/L (ref 3–11)
AST: 19 U/L (ref 5–34)
Albumin: 4.1 g/dL (ref 3.5–5.0)
Alkaline Phosphatase: 92 U/L (ref 40–150)
BILIRUBIN TOTAL: 0.57 mg/dL (ref 0.20–1.20)
BUN: 10.8 mg/dL (ref 7.0–26.0)
CO2: 28 mEq/L (ref 22–29)
Calcium: 9.9 mg/dL (ref 8.4–10.4)
Chloride: 100 mEq/L (ref 98–109)
Creatinine: 0.9 mg/dL (ref 0.7–1.3)
EGFR: 81 mL/min/{1.73_m2} — ABNORMAL LOW (ref >90)
Glucose: 86 mg/dl (ref 70–140)
Potassium: 3.6 mEq/L (ref 3.5–5.1)
Sodium: 138 mEq/L (ref 136–145)
TOTAL PROTEIN: 8.2 g/dL (ref 6.4–8.3)

## 2015-03-20 LAB — TSH CHCC: TSH: 1.763 m[IU]/L (ref 0.320–4.118)

## 2015-03-20 NOTE — Progress Notes (Signed)
[place] Telephone:([phone]) [phone]   Fax:([phone]) [phone]  OFFICE PROGRESS NOTE  [name], MD [address] [place] [address]  DIAGNOSIS: Metastatic non-small cell lung cancer, squamous cell carcinoma initially diagnosed as Stage IIA (T2b., N0, M0) non-small cell lung cancer, squamous cell carcinoma diagnosed in December of 2013   PRIOR THERAPY:  1) Status post right upper lobectomy under the care of Dr. Prescott Gum on 09/14/2012.  2) Adjuvant chemotherapy with cisplatin 75 mg/M2 and docetaxel 75 mg/M2 with Neulasta support every 3 weeks, status post 1 cycle. Starting with cycle #2, patient will receive carboplatin for an AUC initially of 4.5 and paclitaxel at 175 mg per meter squared with Neulasta support given every 3 weeks. Status post a total of 3 cycles.  3) Systemic chemotherapy with carboplatin for AUC of 5 on day 1 and gemcitabine 1000 MG/M2 on days 1 and 8 every 3 weeks. First dose 09/11/2014. Status post 4 cycles. Discontinued secondary to intolerance 4) Immunotherapy with Nivolumab 3 MG/KG every 2 weeks. Status post 4 cycles. 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  CURRENT THERAPY: None.  CHEMOTHERAPY INTENT: Palliative. CURRENT # OF CHEMOTHERAPY CYCLES: 0 CURRENT ANTIEMETICS: Zofran, dexamethasone and Compazine  CURRENT SMOKING STATUS: Quit smoking recently.  ORAL CHEMOTHERAPY AND CONSENT: None  CURRENT BISPHOSPHONATES USE: None  PAIN MANAGEMENT: 3/10 controlled by Norco when necessary  NARCOTICS INDUCED CONSTIPATION: Milk of magnesia on as-needed basis.  LIVING WILL AND CODE STATUS: No CODE BLUE.   INTERVAL HISTORY:  MCLAIN FREER 74 y.o. male returns to the clinic today for followup visit accompanied his wife and daughter. Recently completed his stereotactic radiotherapy to metastatic brain lesions. He has no complaints today except except for fatigue as well as the baseline shortness of  breath. He is currently on home oxygen. He denied having any significant chest pain but has mild cough and no hemoptysis. He denied having any significant nausea or vomiting, no fever or chills, no weight loss or night sweats. He is currently under evaluation for a clinical trial at [facility] with [id number]. He is here for evaluation and discussion of his treatment options.   MEDICAL HISTORY: Past Medical History  Diagnosis Date  . Arthritis   . Wheezing   . Sore throat   . Carotid artery occlusion   . COPD (chronic obstructive pulmonary disease)   . Lumbar disc disease   . Restless leg syndrome   . Allergic rhinitis   . Hypertension   . Anxiety     anxiety  . Shortness of breath   . Lung mass   . GERD (gastroesophageal reflux disease)   . H/O hiatal hernia   . Pre-operative cardiovascular examination   . Nonspecific abnormal unspecified cardiovascular function study   . Peripheral vascular disease, unspecified   . Pneumonia 2014    ?   Marland Kitchen On home oxygen therapy     prn  . Constipation   . Cancer 09/14/12    invasive mod diff squamous cell ca  . 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 S/P radiation therapy 12/04/14 srs    lt frontal brain    ALLERGIES:  has No Known Allergies.  MEDICATIONS:  Current Outpatient Prescriptions  Medication Sig Dispense Refill  . amLODipine (NORVASC) 10 MG tablet Take 1 tablet (10 mg total) by mouth daily. 90 tablet 3  . aspirin EC 81  MG tablet Take 81 mg by mouth daily.     . budesonide (PULMICORT FLEXHALER) 180 MCG/ACT inhaler Inhale 2 puffs into the lungs 2 (two) times daily. (Patient not taking: Reported on 03/05/2015) 1 Inhaler 4  . budesonide (PULMICORT FLEXHALER) 180 MCG/ACT inhaler Inhale 2 puffs into the lungs 2 (two) times daily. 1 Inhaler 0  . clonazePAM (KLONOPIN) 0.5 MG tablet Take 0.5 mg by mouth 2 (two) times daily as needed for anxiety. Takes one tablet by mouth two times daily as needed for anxiety.      Marland Kitchen HYDROcodone-acetaminophen (NORCO) 7.5-325 MG per tablet Take 1 tablet by mouth every 6 (six) hours as needed for moderate pain. 30 tablet 0  . ipratropium-albuterol (DUONEB) 0.5-2.5 (3) MG/3ML SOLN Take 3 mLs by nebulization 4 (four) times daily. (Patient taking differently: Take 3 mLs by nebulization 4 (four) times daily. Pt takes nebulizer 2 to 3 times daily.) 360 mL 2  . isosorbide mononitrate (IMDUR) 30 MG 24 hr tablet Take 1 tablet (30 mg total) by mouth daily. 30 tablet 3  . LORazepam (ATIVAN) 0.5 MG tablet Take 1 tablet by mouth or sublingually every 8 hours as needed for nausea. May take 1 tablet by mouth at bedtime. 40 tablet 0  . metoprolol tartrate (LOPRESSOR) 25 MG tablet Take 25 mg by mouth 2 (two) times daily.     Marland Kitchen omeprazole (PRILOSEC) 40 MG capsule Take 40 mg by mouth daily.    . potassium chloride SA (K-DUR,KLOR-CON) 20 MEQ tablet Take 1 tablet (20 mEq total) by mouth 2 (two) times daily. (Patient not taking: Reported on 01/17/2015) 14 tablet 0  . pramipexole (MIRAPEX) 1 MG tablet Take 1 mg by mouth at bedtime.    . traMADol (ULTRAM) 50 MG tablet Take 1 tablet (50 mg total) by mouth every 12 (twelve) hours as needed. 30 tablet 0  . zolpidem (AMBIEN) 10 MG tablet Take 10 mg by mouth at bedtime as needed for sleep.     No current facility-administered medications for this visit.   Facility-Administered Medications Ordered in Other Visits  Medication Dose Route Frequency Provider Last Rate Last Dose  . sodium chloride 0.9 % injection 10 mL  10 mL Intracatheter PRN Curt Bears, MD   10 mL at 01/29/15 0944    SURGICAL HISTORY:  Past Surgical History  Procedure Laterality Date  . Carotid endarterectomy  10/15/2010    left  . Hand surgery      repair of the left long, ring, and small finger after a saw injury  . Video bronchoscopy  07/24/2012    Procedure: VIDEO BRONCHOSCOPY WITH FLUORO;  Surgeon: Kathee Delton, MD;  Location: [facility];  Service: Cardiopulmonary;   Laterality: Bilateral;  . Lobectomy Right 09/14/2012    Procedure: LOBECTOMY;  Surgeon: Ivin Poot, MD;  Location: [facility];  Service: Thoracic;  Laterality: Right;  RIGHT UPPER LOBECTOMY  . Video assisted thoracoscopy Right 09/14/2012    Procedure: VIDEO ASSISTED THORACOSCOPY;  Surgeon: Ivin Poot, MD;  Location: [facility];  Service: Thoracic;  Laterality: Right;  . Video bronchoscopy with endobronchial ultrasound N/A 08/27/2014    Procedure: VIDEO BRONCHOSCOPY WITH ENDOBRONCHIAL ULTRASOUND;  Surgeon: Ivin Poot, MD;  Location: [facility];  Service: Thoracic;  Laterality: N/A;  . Scalene node biopsy Right 08/27/2014    Procedure: BIOPSY SCALENE NODE;  Surgeon: Ivin Poot, MD;  Location: [facility];  Service: Thoracic;  Laterality: Right;  . Sterotactic radiosurgery Right 09/25/14    right  parietal 30Gy/1 fx    REVIEW OF SYSTEMS:  Constitutional: positive for fatigue Eyes: negative Ears, nose, mouth, throat, and face: negative Respiratory: positive for cough and dyspnea on exertion Cardiovascular: negative Gastrointestinal: negative Genitourinary:negative Integument/breast: negative Hematologic/lymphatic: negative Musculoskeletal:negative Neurological: negative Behavioral/Psych: negative Endocrine: negative Allergic/Immunologic: negative   PHYSICAL EXAMINATION: General appearance: alert, cooperative and no distress Head: Normocephalic, without obvious abnormality, atraumatic Neck: no adenopathy, no JVD, supple, symmetrical, trachea midline and thyroid not enlarged, symmetric, no tenderness/mass/nodules Lymph nodes: Cervical, supraclavicular, and axillary nodes normal. Resp: wheezes bilaterally Back: symmetric, no curvature. ROM normal. No CVA tenderness. Cardio: regular rate and rhythm, S1, S2 normal, no murmur, click, rub or gallop GI: soft, non-tender; bowel sounds normal; no masses,  no organomegaly Extremities: extremities normal, atraumatic, no cyanosis or edema Neurologic:  Alert and oriented X 3, normal strength and tone. Normal symmetric reflexes. Normal coordination and gait  ECOG PERFORMANCE STATUS: 1 - Symptomatic but completely ambulatory  Blood pressure 160/85, pulse 80, temperature 98.4 F (36.9 C), temperature source Oral, resp. rate 18, height 5' 9.5" (1.765 m), weight 176 lb 14.4 oz (80.241 kg), SpO2 99 %.  LABORATORY DATA: Lab Results  Component Value Date   WBC 5.7 03/20/2015   HGB 13.1 03/20/2015   HCT 39.5 03/20/2015   MCV 94.0 03/20/2015   PLT 219 03/20/2015      Chemistry      Component Value Date/Time   NA 135* 02/12/2015 1322   NA 138 12/05/2014 0618   K 3.4* 02/12/2015 1322   K 3.4* 12/05/2014 0618   CL 95* 12/05/2014 0618   CL 101 12/28/2012 0852   CO2 30* 02/12/2015 1322   CO2 32 12/05/2014 0618   BUN 6.2* 02/12/2015 1322   BUN 9 12/05/2014 0618   CREATININE 0.8 02/12/2015 1322   CREATININE 0.52* 12/05/2014 0618      Component Value Date/Time   CALCIUM 9.0 02/12/2015 1322   CALCIUM 9.2 12/05/2014 0618   ALKPHOS 89 02/12/2015 1322   ALKPHOS 71 12/05/2014 0618   AST 17 02/12/2015 1322   AST 24 12/05/2014 0618   ALT 17 02/12/2015 1322   ALT 30 12/05/2014 0618   BILITOT 0.24 02/12/2015 1322   BILITOT 0.2* 12/05/2014 0618       RADIOGRAPHIC STUDIES: Mr Jeri Cos Wo Contrast  Mar 15, 2015   CLINICAL DATA:  S RS restaging.  Metastatic lung cancer.  EXAM: MRI HEAD WITHOUT AND WITH CONTRAST  TECHNIQUE: Multiplanar, multiecho pulse sequences of the brain and surrounding structures were obtained without and with intravenous contrast.  CONTRAST:  16 cc MultiHance  COMPARISON:  11/22/2014.  09/17/2014.  08/08/2014.  08/03/2012.  FINDINGS: There is a background pattern of chronic small vessel ischemic changes affecting the pons an the cerebral hemispheric deep white matter. No cortical or large vessel territory infarction.  There is a newly seen 10 mm metastasis in the right posterior lateral cerebellum. Mild surrounding edema  without mass effect or hemorrhage. No other posterior fossa lesion.  There is a new 4-5 mm metastasis at the inferior right frontal lobe without edema or mass effect.  Left frontal metastasis measuring 2-3 mm is again demonstrated, possibly minimally larger.  Newly seen 2 mm left parietal metastasis axial image 122.  Enlargement of the right parietal metastasis now measuring 4 mm in diameter, image 127.  No hydrocephalus. No extra-axial collection. No skull or skullbase lesion. Sinuses are clear.  IMPRESSION: Worsening of intracranial metastatic disease. 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 New 4-5 mm  inferior right frontal metastasis. New 2 mm left parietal metastasis. Enlargement of previously seen right parietal metastasis, now measuring 4 mm. Similar or minimally enlarged 2-3 mm left frontal metastasis.   Electronically Signed   By: Nelson Chimes M.D.   On: 02/28/2015 13:49    ASSESSMENT AND PLAN: This is a very pleasant 74 years old white male with history of stage IIA non-small cell lung cancer status post right upper lobectomy followed by 4 cycles of adjuvant chemotherapy.  The patient underwent systemic chemotherapy with carboplatin and gemcitabine status post 4 cycles discontinued secondary to intolerance and mild disease progression.  The patient completed treatment with Nivolumab status post 4 cycles but this was discontinued secondary to disease progression. He also recently completed stereotactic radiotherapy to metastatic brain lesions. I had a lengthy discussion with the patient and his family about his condition. I strongly recommended for him to consider enrollment on the clinical trial at [facility] if he is eligible for the trial. We actually have the same trial here in [place] but temporary closed to enrollment. If the patient is not eligible for the clinical trial at [facility], I may consider him for treatment with docetaxel and Cyramza. I will arrange for the patient to  come back for follow-up visit in 3 weeks for reevaluation and more discussion of his treatment based on his eligibility for the clinical trial. He was advised to call immediately if he has any concerning symptoms in the interval.  The patient voices understanding of current disease status and treatment options and is in agreement with the current care plan.  All questions were answered. The patient knows to call the clinic with any problems, questions or concerns. We can certainly see the patient much sooner if necessary.  Disclaimer: This note was dictated with voice recognition software. Similar sounding words can inadvertently be transcribed and may not be corrected upon review.

## 2015-03-25 ENCOUNTER — Telehealth: Payer: Self-pay | Admitting: Medical Oncology

## 2015-03-25 NOTE — Telephone Encounter (Signed)
Wife said [facility] appt postponed to next week . Noted .

## 2015-03-27 ENCOUNTER — Telehealth: Payer: Self-pay | Admitting: *Deleted

## 2015-03-27 NOTE — Telephone Encounter (Signed)
Keep current plan.

## 2015-03-27 NOTE — Telephone Encounter (Signed)
Patient called to report he has a knot under his collarbone near the biopsy site.  It is a raised area about the size of a nickel.  It is not red or painful.  He states it is "hard and puffy".  He denies fever.  He just felt this last night.  He is supposed to go to [place] next week and then see [name] the end of September.  He wants [name] to know about this and see if he needs to do anything about it.  Call back is [phone]

## 2015-03-28 ENCOUNTER — Telehealth: Payer: Self-pay | Admitting: *Deleted

## 2015-03-28 NOTE — Telephone Encounter (Signed)
Spoke with patient and let him know that [name] said to keep with this current plan.

## 2015-03-28 NOTE — Telephone Encounter (Addendum)
Pt notified next wed. i instructed pt to call if he has symptoms of infection described below. [facility] appt is 04/02/15

## 2015-03-31 NOTE — Progress Notes (Signed)
  Radiation Oncology         [phone]) [phone] ________________________________  Name: [name] MRN: [id number]  Date: 03/12/2015  DOB: [birth year]   SPECIAL TREATMENT PROCEDURE   3D TREATMENT PLANNING AND DOSIMETRY: [name] radiation plan was reviewed and approved by Dr. Saintclair Halsted from neurosurgery and radiation oncology prior to treatment. It showed 3-dimensional radiation distributions overlaid onto the planning CT/MRI image set. The [facility] for the target structures as well as the organs at risk were reviewed. The documentation of the 3D plan and dosimetry are filed in the radiation oncology EMR.   NARRATIVE: The patient was brought to the TrueBeam stereotactic radiation treatment machine and placed supine on the CT couch. The head frame was applied, and the patient was set up for stereotactic radiosurgery. Neurosurgery was present for the set-up and delivery   SIMULATION VERIFICATION: In the couch zero-angle position, the patient underwent Exactrac imaging using the Brainlab system with orthogonal KV images. These were carefully aligned and repeated to confirm treatment position for each of the isocenters. The Exactrac snap film verification was repeated at each couch angle.   SPECIAL TREATMENT PROCEDURE: The patient received stereotactic radiosurgery to the following targets:  1.  PTV3 Rt Cerebellum 100m target was treated using 3 Arcs to a prescription dose of 20 Gy. ExacTrac Snap verification was performed for each couch angle.  2.  PTV4 Rt Frontal 565mtarget was treated using 3 Arcs to a prescription dose of 20 Gy. ExacTrac Snap verification was performed for each couch angle. 3.  PTV5 Lt Parietal 68m27marget was treated using 2 Arcs to a prescription dose of 20 Gy. ExacTrac Snap verification was performed for each couch angle.  STEREOTACTIC TREATMENT MANAGEMENT: Following delivery, the patient was transported to nursing in stable condition and monitored for possible acute effects. Vital  signs were recorded . The patient tolerated treatment without significant acute effects, and was discharged to home in stable condition.  PLAN: Follow-up in one month.   ------------------------------------------------  JohJodelle GrossD, PhD

## 2015-03-31 NOTE — Progress Notes (Signed)
  Radiation Oncology         [phone] ________________________________  Name: TYSHAWN CIULLO MRN: [id number]  Date: 03/12/2015  DOB: Apr 17, 1941  End of Treatment Note  Diagnosis:   Metastatic non-small cell lung cancer     Indication for treatment:  palliative       Radiation treatment dates:   03/12/2015  Site/dose:    1.  PTV3 Rt Cerebellum 64m target was treated using 3 Arcs to a prescription dose of 20 Gy. ExacTrac Snap verification was performed for each couch angle.  2.  PTV4 Rt Frontal 5922mtarget was treated using 3 Arcs to a prescription dose of 20 Gy. ExacTrac Snap verification was performed for each couch angle. 3.  PTV5 Lt Parietal 22m65marget was treated using 2 Arcs to a prescription dose of 20 Gy. ExacTrac Snap verification was performed for each couch angle.  Narrative: The patient tolerated radiation treatment well.   There were no signs of acute toxicity after treatment.  Plan: The patient has completed radiation treatment. The patient will return to radiation oncology clinic for routine followup in one month. I advised the patient to call or return sooner if they have any questions or concerns related to their recovery or treatment. ________________________________  ------------------------------------------------  JohJodelle GrossD, PhD

## 2015-04-01 ENCOUNTER — Telehealth: Payer: Self-pay | Admitting: Nurse Practitioner

## 2015-04-01 ENCOUNTER — Telehealth: Payer: Self-pay | Admitting: Medical Oncology

## 2015-04-01 DIAGNOSIS — C3490 Malignant neoplasm of unspecified part of unspecified bronchus or lung: Secondary | ICD-10-CM

## 2015-04-01 NOTE — Telephone Encounter (Signed)
Confirmed appointment for 09/21

## 2015-04-01 NOTE — Telephone Encounter (Signed)
Pt said [facility] cancelled appt for tomorrow due to tissue not back. He feels like he is getting better and more SOB. I sent order for Onc tx request for [facility] tomorrow.

## 2015-04-02 ENCOUNTER — Other Ambulatory Visit (HOSPITAL_BASED_OUTPATIENT_CLINIC_OR_DEPARTMENT_OTHER): Payer: Medicare Other

## 2015-04-02 ENCOUNTER — Encounter: Payer: Self-pay | Admitting: Nurse Practitioner

## 2015-04-02 ENCOUNTER — Ambulatory Visit (HOSPITAL_BASED_OUTPATIENT_CLINIC_OR_DEPARTMENT_OTHER): Payer: Medicare Other | Admitting: Nurse Practitioner

## 2015-04-02 ENCOUNTER — Telehealth: Payer: Self-pay | Admitting: Internal Medicine

## 2015-04-02 VITALS — BP 197/78 | HR 88 | Temp 98.3°F | Resp 25 | Ht 69.5 in | Wt 177.1 lb

## 2015-04-02 DIAGNOSIS — R609 Edema, unspecified: Secondary | ICD-10-CM

## 2015-04-02 DIAGNOSIS — C3490 Malignant neoplasm of unspecified part of unspecified bronchus or lung: Secondary | ICD-10-CM

## 2015-04-02 DIAGNOSIS — I10 Essential (primary) hypertension: Secondary | ICD-10-CM

## 2015-04-02 DIAGNOSIS — I2510 Atherosclerotic heart disease of native coronary artery without angina pectoris: Secondary | ICD-10-CM

## 2015-04-02 DIAGNOSIS — E876 Hypokalemia: Secondary | ICD-10-CM | POA: Diagnosis not present

## 2015-04-02 LAB — CBC WITH DIFFERENTIAL/PLATELET
BASO%: 0.4 % (ref 0.0–2.0)
BASOS ABS: 0 10*3/uL (ref 0.0–0.1)
EOS ABS: 0.2 10*3/uL (ref 0.0–0.5)
EOS%: 3 % (ref 0.0–7.0)
HEMATOCRIT: 39.8 % (ref 38.4–49.9)
HGB: 13.1 g/dL (ref 13.0–17.1)
LYMPH%: 25.8 % (ref 14.0–49.0)
MCH: 31 pg (ref 27.2–33.4)
MCHC: 32.9 g/dL (ref 32.0–36.0)
MCV: 94.3 fL (ref 79.3–98.0)
MONO#: 0.4 10*3/uL (ref 0.1–0.9)
MONO%: 7.7 % (ref 0.0–14.0)
NEUT#: 3.6 10*3/uL (ref 1.5–6.5)
NEUT%: 63.1 % (ref 39.0–75.0)
Platelets: 220 10*3/uL (ref 140–400)
RBC: 4.22 10*6/uL (ref 4.20–5.82)
RDW: 14.3 % (ref 11.0–14.6)
WBC: 5.6 10*3/uL (ref 4.0–10.3)
lymph#: 1.5 10*3/uL (ref 0.9–3.3)

## 2015-04-02 LAB — COMPREHENSIVE METABOLIC PANEL (CC13)
ALT: 13 U/L (ref 0–55)
AST: 18 U/L (ref 5–34)
Albumin: 3.8 g/dL (ref 3.5–5.0)
Alkaline Phosphatase: 90 U/L (ref 40–150)
Anion Gap: 9 meq/L (ref 3–11)
BUN: 11.2 mg/dL (ref 7.0–26.0)
CO2: 32 meq/L — ABNORMAL HIGH (ref 22–29)
Calcium: 9.6 mg/dL (ref 8.4–10.4)
Chloride: 100 meq/L (ref 98–109)
Creatinine: 1 mg/dL (ref 0.7–1.3)
EGFR: 79 ml/min/1.73 m2 — ABNORMAL LOW
Glucose: 167 mg/dL — ABNORMAL HIGH (ref 70–140)
Potassium: 3.2 meq/L — ABNORMAL LOW (ref 3.5–5.1)
Sodium: 141 meq/L (ref 136–145)
Total Bilirubin: 0.37 mg/dL (ref 0.20–1.20)
Total Protein: 7.5 g/dL (ref 6.4–8.3)

## 2015-04-02 MED ORDER — POTASSIUM CHLORIDE CRYS ER 20 MEQ PO TBCR: 20.0000 meq | EXTENDED_RELEASE_TABLET | Freq: Every day | ORAL | Status: DC

## 2015-04-02 NOTE — Assessment & Plan Note (Addendum)
Patient has undergone multiple chemotherapies and immunotherapy with the past several months; with his last restaging scans, all showing some progression.  Patient states that he is awaiting biopsy results per [facility].  Patient's wife spoke with Lelan Pons, study nurse at [facility] at [phone] just yesterday; and patient's wife is still unclear when the biopsy results will be available.  In the meantime-patient continues with complaint of generalized fatigue and chronic shortness of breath.  He typically uses home O2 via nasal cannula at 2 L on a regular basis.  He has increased dyspnea with any exertion whatsoever.  On exam.  Patient does appear mildly short of breath; and has diminished breath sounds with wheezing to all lung fields.  Also, patient has a moderately firm mass to the base of his right neck just above his collarbone.  The area is nontender with palpation.  Patient does complain of some discomfort to this area when he turns his head to the left.  Upon further questioning-patient admits that he has become frustrated with his situation this past week; and has not been taking either his blood pressure medications.  His nebulizer treatments, and sometimes forgets his Lasix as well.  Patient was encouraged to take all his blood pressure medications, his Lasix, and uses nebulizer 4 times per day as directed.  Patient stated that he would start taking his medications as directed.  Once he returns home.  Reviewed all with [name] today-and decision was made to obtain a repeat restaging CT with contrast of the chest/abdomen/pelvis this week.  Will review scan results; and then plan to initiate new chemotherapy regimen which consist of docetaxel and Cyramza on Monday, 04/07/2015.  Patient will need to be scheduled for labs and chemotherapy for 04/07/2015.  Patient will need labs, visit, and chemotherapy on a three-week basis after initiating the first day of chemotherapy.

## 2015-04-02 NOTE — Progress Notes (Signed)
SYMPTOM MANAGEMENT CLINIC   HPI: [name] 74 y.o. male diagnosed with lung cancer; with brain metastasis.  Patient is status post lobectomy; and multiple different chemotherapy and immunotherapy regimens in the past.  Currently undergoing observation at the present time.  The plan is for the patient to initiate docetaxel/cyramza  chemotherapy next week.  Patient has undergone multiple chemotherapies and immunotherapy with the past several months; with his last restaging scans, all showing some progression.  Patient states that he is awaiting biopsy results per [facility].  Patient's wife spoke with Lelan Pons, study nurse at [facility] at [phone] just yesterday; and patient's wife is still unclear when the biopsy results will be available.  In the meantime-patient continues with complaint of generalized fatigue and chronic shortness of breath.  He typically uses home O2 via nasal cannula at 2 L on a regular basis.  He has increased dyspnea with any exertion whatsoever.  Patient denies any recent fevers or chills.  HPI  ROS  Past Medical History  Diagnosis Date  . Arthritis   . Wheezing   . Sore throat   . Carotid artery occlusion   . COPD (chronic obstructive pulmonary disease)   . Lumbar disc disease   . Restless leg syndrome   . Allergic rhinitis   . Hypertension   . Anxiety     anxiety  . Shortness of breath   . Lung mass   . GERD (gastroesophageal reflux disease)   . H/O hiatal hernia   . Pre-operative cardiovascular examination   . Nonspecific abnormal unspecified cardiovascular function study   . Peripheral vascular disease, unspecified   . Pneumonia 2014    ?   Marland Kitchen On home oxygen therapy     prn  . Constipation   . Cancer 09/14/12    invasive mod diff squamous cell ca  . 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 S/P radiation therapy 12/04/14 srs    lt frontal brain    Past Surgical History  Procedure Laterality Date  . Carotid  endarterectomy  10/15/2010    left  . Hand surgery      repair of the left long, ring, and small finger after a saw injury  . Video bronchoscopy  07/24/2012    Procedure: VIDEO BRONCHOSCOPY WITH FLUORO;  Surgeon: Kathee Delton, MD;  Location: [facility];  Service: Cardiopulmonary;  Laterality: Bilateral;  . Lobectomy Right 09/14/2012    Procedure: LOBECTOMY;  Surgeon: Ivin Poot, MD;  Location: [place];  Service: Thoracic;  Laterality: Right;  RIGHT UPPER LOBECTOMY  . Video assisted thoracoscopy Right 09/14/2012    Procedure: VIDEO ASSISTED THORACOSCOPY;  Surgeon: Ivin Poot, MD;  Location: [place];  Service: Thoracic;  Laterality: Right;  . Video bronchoscopy with endobronchial ultrasound N/A 08/27/2014    Procedure: VIDEO BRONCHOSCOPY WITH ENDOBRONCHIAL ULTRASOUND;  Surgeon: Ivin Poot, MD;  Location: [facility] OR;  Service: Thoracic;  Laterality: N/A;  . Scalene node biopsy Right 08/27/2014    Procedure: BIOPSY SCALENE NODE;  Surgeon: Ivin Poot, MD;  Location: [facility] OR;  Service: Thoracic;  Laterality: Right;  . Sterotactic radiosurgery Right 09/25/14    right parietal 30Gy/1 fx    has Occlusion and stenosis of carotid artery without mention of cerebral infarction; COPD (chronic obstructive pulmonary disease); Lung mass; Pre-operative cardiovascular examination; Nonspecific abnormal unspecified cardiovascular function study; Peripheral vascular disease, unspecified; Lung cancer; Pain in limb; Coronary atherosclerosis of  native coronary artery; Essential hypertension, benign; Tobacco use disorder; Carotid stenosis; Aftercare following surgery of the circulatory system; Weakness-Hand and  Legs; COPD exacerbation; Tachycardia; Dyspnea; Brain metastasis; Chemotherapy induced neutropenia; Renal stone; Pyelonephritis; Dehydration; UTI (lower urinary tract infection); Left arm cellulitis; Blood poisoning; Sepsis; Encounter for antineoplastic immunotherapy; Cancer associated pain; Rash; Syncope;  Constipation; Insomnia; Hypokalemia; and Peripheral edema on his problem list.    has No Known Allergies.    Medication List       This list is accurate as of: 04/02/15 10:43 AM.  Always use your most recent med list.               albuterol (2.5 MG/3ML) 0.083% nebulizer solution  Commonly known as:  PROVENTIL  Inhale 2.5 mg into the lungs every 6 (six) hours as needed.     amLODipine 10 MG tablet  Commonly known as:  NORVASC  Take 1 tablet (10 mg total) by mouth daily.     aspirin EC 81 MG tablet  Take 81 mg by mouth daily.     clonazePAM 0.5 MG tablet  Commonly known as:  KLONOPIN  Take 0.5 mg by mouth 2 (two) times daily as needed for anxiety. Takes one tablet by mouth two times daily as needed for anxiety.     furosemide 20 MG tablet  Commonly known as:  LASIX  Take 20 mg by mouth daily.     HYDROcodone-acetaminophen 7.5-325 MG per tablet  Commonly known as:  NORCO  Take 1 tablet by mouth every 6 (six) hours as needed for moderate pain.     ipratropium-albuterol 0.5-2.5 (3) MG/3ML Soln  Commonly known as:  DUONEB  Take 3 mLs by nebulization 4 (four) times daily.     isosorbide mononitrate 30 MG 24 hr tablet  Commonly known as:  IMDUR  Take 1 tablet (30 mg total) by mouth daily.     LORazepam 0.5 MG tablet  Commonly known as:  ATIVAN  Take 1 tablet by mouth or sublingually every 8 hours as needed for nausea. May take 1 tablet by mouth at bedtime.     metoprolol tartrate 25 MG tablet  Commonly known as:  LOPRESSOR  Take 25 mg by mouth 2 (two) times daily.     omeprazole 40 MG capsule  Commonly known as:  PRILOSEC  Take 40 mg by mouth daily.     potassium chloride SA 20 MEQ tablet  Commonly known as:  K-DUR,KLOR-CON  Take 1 tablet (20 mEq total) by mouth daily.     pramipexole 1 MG tablet  Commonly known as:  MIRAPEX  Take 1 mg by mouth at bedtime.     traMADol 50 MG tablet  Commonly known as:  ULTRAM  Take 1 tablet (50 mg total) by mouth every 12  (twelve) hours as needed.     zolpidem 10 MG tablet  Commonly known as:  AMBIEN  Take 10 mg by mouth at bedtime as needed for sleep.         PHYSICAL EXAMINATION  Oncology Vitals 04/02/2015 03/20/2015 03/12/2015 03/12/2015 03/05/2015 03/05/2015 02/28/2015  Height 177 cm 177 cm - - 177 cm - -  Weight 80.332 kg 80.241 kg - - 81.693 kg - 78.472 kg  Weight (lbs) 177 lbs 2 oz 176 lbs 14 oz - - 180 lbs 2 oz - 173 lbs  BMI (kg/m2) 25.78 kg/m2 25.75 kg/m2 - - 26.21 kg/m2 - -  Temp 98.3 98.4 98.4 98 98.1 97.7 -  Pulse 88  80 82 88 77 72 -  Resp _0 -  SpO2 100 99 100 99 93 100 -  BSA (m2) 1.98 m2 1.98 m2 - - 2 m2 - -   BP Readings from Last 3 Encounters:  04/02/15 197/78  03/20/15 160/85  03/12/15 168/75    Physical Exam  Constitutional: He is oriented to person, place, and time. He appears malnourished. He appears unhealthy. He appears cachectic.  Patient appears fatigued, weak, frail, thin, and chronically ill.  HENT:  Head: Normocephalic and atraumatic.  Mouth/Throat: Oropharynx is clear and moist.  Right neck just above the clavicle with moderately firm mass that is nontender.  Eyes: Conjunctivae and EOM are normal. Pupils are equal, round, and reactive to light. Right eye exhibits no discharge. Left eye exhibits no discharge. No scleral icterus.  Neck: Normal range of motion. Neck supple. No JVD present. No tracheal deviation present. No thyromegaly present.  Cardiovascular: Normal rate, regular rhythm, normal heart sounds and intact distal pulses.   Pulmonary/Chest: No stridor. No respiratory distress. He has wheezes. He has no rales. He exhibits no tenderness.  Decreased breath sounds bilaterally; with wheezes throughout.  Mild shortness of breath noted.  Patient on 2 L O2 via nasal cannula.  Abdominal: Soft. Bowel sounds are normal. He exhibits no distension and no mass. There is no tenderness. There is no rebound and no guarding.  Musculoskeletal: Normal range of motion.  He exhibits edema. He exhibits no tenderness.  +1 edema to bilateral ankles.  Lymphadenopathy:    He has no cervical adenopathy.  Neurological: He is alert and oriented to person, place, and time. Gait normal.  Skin: Skin is warm and dry. No rash noted. No erythema. There is pallor.  Psychiatric: Affect normal.  Nursing note and vitals reviewed.   LABORATORY DATA:. Appointment on 04/02/2015  Component Date Value Ref Range Status  . WBC 04/02/2015 5.6  4.0 - 10.3 10e3/uL Final  . NEUT# 04/02/2015 3.6  1.5 - 6.5 10e3/uL Final  . HGB 04/02/2015 13.1  13.0 - 17.1 g/dL Final  . HCT 04/02/2015 39.8  38.4 - 49.9 % Final  . Platelets 04/02/2015 220  140 - 400 10e3/uL Final  . MCV 04/02/2015 94.3  79.3 - 98.0 fL Final  . MCH 04/02/2015 31.0  27.2 - 33.4 pg Final  . MCHC 04/02/2015 32.9  32.0 - 36.0 g/dL Final  . RBC 04/02/2015 4.22  4.20 - 5.82 10e6/uL Final  . RDW 04/02/2015 14.3  11.0 - 14.6 % Final  . lymph# 04/02/2015 1.5  0.9 - 3.3 10e3/uL Final  . MONO# 04/02/2015 0.4  0.1 - 0.9 10e3/uL Final  . Eosinophils Absolute 04/02/2015 0.2  0.0 - 0.5 10e3/uL Final  . Basophils Absolute 04/02/2015 0.0  0.0 - 0.1 10e3/uL Final  . NEUT% 04/02/2015 63.1  39.0 - 75.0 % Final  . LYMPH% 04/02/2015 25.8  14.0 - 49.0 % Final  . MONO% 04/02/2015 7.7  0.0 - 14.0 % Final  . EOS% 04/02/2015 3.0  0.0 - 7.0 % Final  . BASO% 04/02/2015 0.4  0.0 - 2.0 % Final  . Sodium 04/02/2015 141  136 - 145 mEq/L Final  . Potassium 04/02/2015 3.2* 3.5 - 5.1 mEq/L Final  . Chloride 04/02/2015 100  98 - 109 mEq/L Final  . CO2 04/02/2015 32* 22 - 29 mEq/L Final  . Glucose 04/02/2015 167* 70 - 140 mg/dl Final   Glucose reference range is for nonfasting patients. Fasting glucose reference  range is 70- 100.  Marland Kitchen BUN 04/02/2015 11.2  7.0 - 26.0 mg/dL Final  . Creatinine 04/02/2015 1.0  0.7 - 1.3 mg/dL Final  . Total Bilirubin 04/02/2015 0.37  0.20 - 1.20 mg/dL Final  . Alkaline Phosphatase 04/02/2015 90  40 - 150 U/L Final    . AST 04/02/2015 18  5 - 34 U/L Final  . ALT 04/02/2015 13  0 - 55 U/L Final  . Total Protein 04/02/2015 7.5  6.4 - 8.3 g/dL Final  . Albumin 04/02/2015 3.8  3.5 - 5.0 g/dL Final  . Calcium 04/02/2015 9.6  8.4 - 10.4 mg/dL Final  . Anion Gap 04/02/2015 9  3 - 11 mEq/L Final  . EGFR 04/02/2015 79* >90 ml/min/1.73 m2 Final   eGFR is calculated using the CKD-EPI Creatinine Equation (2009)     RADIOGRAPHIC STUDIES: No results found.  ASSESSMENT/PLAN:    Peripheral edema Patient has a history of chronic bilateral lower extremity edema.  Patient takes Lasix 20 mg on a daily basis.  Patient continues to complain of chronic shortness of breath and wheezing as baseline; despite having nebulizer treatments at home and home oxygen.  On exam.  Patient has +1 edema to bilateral lower extremities only.  Patient was advised to continue taking his Lasix as previously directed.  He was also encouraged to elevate his legs above the level of for heart whenever possible.  He was also encouraged to remain as active as possible.  Lung cancer Patient has undergone multiple chemotherapies and immunotherapy with the past several months; with his last restaging scans, all showing some progression.  Patient states that he is awaiting biopsy results per [facility].  Patient's wife spoke with Lelan Pons, study nurse at [facility] at [phone] just yesterday; and patient's wife is still unclear when the biopsy results will be available.  In the meantime-patient continues with complaint of generalized fatigue and chronic shortness of breath.  He typically uses home O2 via nasal cannula at 2 L on a regular basis.  He has increased dyspnea with any exertion whatsoever.  On exam.  Patient does appear mildly short of breath; and has diminished breath sounds with wheezing to all lung fields.  Also, patient has a moderately firm mass to the base of his right neck just above his collarbone.  The area is nontender with  palpation.  Patient does complain of some discomfort to this area when he turns his head to the left.  Upon further questioning-patient admits that he has become frustrated with his situation this past week; and has not been taking either his blood pressure medications.  His nebulizer treatments, and sometimes forgets his Lasix as well.  Patient was encouraged to take all his blood pressure medications, his Lasix, and uses nebulizer 4 times per day as directed.  Patient stated that he would start taking his medications as directed.  Once he returns home.  Reviewed all with [name] today-and decision was made to obtain a repeat restaging CT with contrast of the chest/abdomen/pelvis this week.  Will review scan results; and then plan to initiate new chemotherapy regimen which consist of docetaxel and Cyramza on Monday, 04/07/2015.  Patient will need to be scheduled for labs and chemotherapy for 04/07/2015.  Patient will need labs, visit, and chemotherapy on a three-week basis after initiating the first day of chemotherapy.    Hypokalemia Potassium has dropped down to 3.2 today.  Patient states that he typically takes Lasix 20  mg on a daily basis; but has forgotten his Lasix on occasion this past week.  Will prescribed potassium 20 mg once daily for the patient.  Essential hypertension, benign Blood pressure was elevated to 197/78 today.  Patient typically takes Norvasc every morning and Lopressor twice daily.  However, this week.  Patient states that he has forgotten his blood pressure medications.  Each morning.  Advised patient to go home and take his blood pressure medications as directed.  Also advised patient to start checking his blood pressure at home; and let us know if it remains elevated.  Patient stated understanding of all instructions; and was in agreement with this plan of care. The patient knows to call the clinic with any problems, questions or concerns.   This was a shared  visit with [name] today.  Total time spent with patient was 25 minutes;  with greater than 75 percent of that time spent in face to face counseling regarding patient's symptoms,  and coordination of care and follow up.  Disclaimer:This dictation was prepared with Dragon/digital dictation along with [employer]. Any transcriptional errors that result from this process are unintentional.  Drue Second, NP 04/02/2015   ADDENDUM: Hematology/Oncology Attending: I had a face to face encounter with the patient. I recommended his care plan. This is a very pleasant 74 years old white male with metastatic non-small cell lung cancer, squamous cell carcinoma who is currently off treatment and awaiting enrollment in a clinical trial at [place]. The patient and his wife are concerned about the delay in resuming treatment. They are interested in proceeding with systemic chemotherapy with docetaxel and Cyramza. I will arrange for the patient to start the first dose of this treatment next week unless he is enrolled in the clinical trial at [facility]. I discussed with the patient adverse effect of this treatment including but not limited to alopecia, myelosuppression, nausea and vomiting, peripheral neuropathy, liver or renal dysfunction. He would come back for follow-up visit in 2 weeks for reevaluation and management of any adverse effect of his treatment. He was advised to call immediately if he has any concerning symptoms in the interval.  Disclaimer: This note was dictated with voice recognition software. Similar sounding words can inadvertently be transcribed and may not be corrected upon review. Eilleen Kempf., MD 04/05/2015

## 2015-04-02 NOTE — Assessment & Plan Note (Signed)
Potassium has dropped down to 3.2 today.  Patient states that he typically takes Lasix 20 mg on a daily basis; but has forgotten his Lasix on occasion this past week.  Will prescribed potassium 20 mg once daily for the patient.

## 2015-04-02 NOTE — Progress Notes (Signed)
Quick Note:  Call patient with the result and order K Dur 20 meq po qd X 7 days ______ 

## 2015-04-02 NOTE — Assessment & Plan Note (Signed)
Blood pressure was elevated to 197/78 today.  Patient typically takes Norvasc every morning and Lopressor twice daily.  However, this week.  Patient states that he has forgotten his blood pressure medications.  Each morning.  Advised patient to go home and take his blood pressure medications as directed.  Also advised patient to start checking his blood pressure at home; and let us know if it remains elevated.

## 2015-04-02 NOTE — Telephone Encounter (Signed)
s.w. pt wife and confirmed appt....pt ok and aware...gv barium

## 2015-04-02 NOTE — Assessment & Plan Note (Signed)
Patient has a history of chronic bilateral lower extremity edema.  Patient takes Lasix 20 mg on a daily basis.  Patient continues to complain of chronic shortness of breath and wheezing as baseline; despite having nebulizer treatments at home and home oxygen.  On exam.  Patient has +1 edema to bilateral lower extremities only.  Patient was advised to continue taking his Lasix as previously directed.  He was also encouraged to elevate his legs above the level of for heart whenever possible.  He was also encouraged to remain as active as possible.

## 2015-04-03 ENCOUNTER — Telehealth: Payer: Self-pay | Admitting: Medical Oncology

## 2015-04-03 DIAGNOSIS — E876 Hypokalemia: Secondary | ICD-10-CM

## 2015-04-03 MED ORDER — POTASSIUM CHLORIDE CRYS ER 20 MEQ PO TBCR: 20.0000 meq | EXTENDED_RELEASE_TABLET | Freq: Every day | ORAL | Status: DC

## 2015-04-03 NOTE — Telephone Encounter (Signed)
-----   Message from Curt Bears, MD sent at 04/02/2015  4:41 PM EDT ----- Call patient with the result and order K Dur 20 meq po qd X 7 days.

## 2015-04-03 NOTE — Telephone Encounter (Signed)
Pt.notified

## 2015-04-04 ENCOUNTER — Other Ambulatory Visit: Payer: Self-pay | Admitting: Nurse Practitioner

## 2015-04-04 ENCOUNTER — Ambulatory Visit (HOSPITAL_COMMUNITY)
Admission: RE | Admit: 2015-04-04 | Discharge: 2015-04-04 | Disposition: A | Discharge status: Home / Self Care | Payer: Medicare Other | Source: Ambulatory Visit | Attending: Nurse Practitioner | Admitting: Nurse Practitioner

## 2015-04-04 DIAGNOSIS — J439 Emphysema, unspecified: Secondary | ICD-10-CM | POA: Diagnosis not present

## 2015-04-04 DIAGNOSIS — M5136 Other intervertebral disc degeneration, lumbar region: Secondary | ICD-10-CM | POA: Insufficient documentation

## 2015-04-04 DIAGNOSIS — N2889 Other specified disorders of kidney and ureter: Secondary | ICD-10-CM | POA: Insufficient documentation

## 2015-04-04 DIAGNOSIS — Z902 Acquired absence of lung [part of]: Secondary | ICD-10-CM | POA: Insufficient documentation

## 2015-04-04 DIAGNOSIS — R918 Other nonspecific abnormal finding of lung field: Secondary | ICD-10-CM | POA: Insufficient documentation

## 2015-04-04 DIAGNOSIS — M4186 Other forms of scoliosis, lumbar region: Secondary | ICD-10-CM | POA: Diagnosis not present

## 2015-04-04 DIAGNOSIS — C3411 Malignant neoplasm of upper lobe, right bronchus or lung: Secondary | ICD-10-CM | POA: Diagnosis present

## 2015-04-04 DIAGNOSIS — Z9981 Dependence on supplemental oxygen: Secondary | ICD-10-CM | POA: Insufficient documentation

## 2015-04-04 DIAGNOSIS — I701 Atherosclerosis of renal artery: Secondary | ICD-10-CM | POA: Insufficient documentation

## 2015-04-04 DIAGNOSIS — C3490 Malignant neoplasm of unspecified part of unspecified bronchus or lung: Secondary | ICD-10-CM

## 2015-04-04 MED ORDER — IOHEXOL 300 MG/ML  SOLN
100.0000 mL | Freq: Once | INTRAMUSCULAR | Status: AC | PRN
  Administered 2015-04-04: 100 mL via INTRAVENOUS

## 2015-04-04 MED ORDER — IOHEXOL 300 MG/ML  SOLN
50.0000 mL | Freq: Once | INTRAMUSCULAR | Status: AC | PRN
  Administered 2015-04-04: 50 mL via ORAL

## 2015-04-07 ENCOUNTER — Other Ambulatory Visit (HOSPITAL_BASED_OUTPATIENT_CLINIC_OR_DEPARTMENT_OTHER): Payer: Medicare Other

## 2015-04-07 ENCOUNTER — Ambulatory Visit: Payer: Medicare Other

## 2015-04-07 ENCOUNTER — Ambulatory Visit (HOSPITAL_BASED_OUTPATIENT_CLINIC_OR_DEPARTMENT_OTHER): Payer: Medicare Other

## 2015-04-07 VITALS — BP 102/75 | HR 68 | Temp 98.0°F | Resp 19

## 2015-04-07 DIAGNOSIS — C3491 Malignant neoplasm of unspecified part of right bronchus or lung: Secondary | ICD-10-CM

## 2015-04-07 DIAGNOSIS — C3411 Malignant neoplasm of upper lobe, right bronchus or lung: Secondary | ICD-10-CM | POA: Diagnosis present

## 2015-04-07 DIAGNOSIS — Z5112 Encounter for antineoplastic immunotherapy: Secondary | ICD-10-CM | POA: Diagnosis not present

## 2015-04-07 DIAGNOSIS — Z5111 Encounter for antineoplastic chemotherapy: Secondary | ICD-10-CM | POA: Diagnosis present

## 2015-04-07 LAB — CBC WITH DIFFERENTIAL/PLATELET
BASO%: 0.6 % (ref 0.0–2.0)
BASOS ABS: 0 10*3/uL (ref 0.0–0.1)
EOS ABS: 0.1 10*3/uL (ref 0.0–0.5)
EOS%: 1.5 % (ref 0.0–7.0)
HCT: 37.4 % — ABNORMAL LOW (ref 38.4–49.9)
HEMOGLOBIN: 12.5 g/dL — AB (ref 13.0–17.1)
LYMPH%: 18.7 % (ref 14.0–49.0)
MCH: 30.9 pg (ref 27.2–33.4)
MCHC: 33.3 g/dL (ref 32.0–36.0)
MCV: 92.7 fL (ref 79.3–98.0)
MONO#: 0.6 10*3/uL (ref 0.1–0.9)
MONO%: 8.4 % (ref 0.0–14.0)
NEUT#: 4.7 10*3/uL (ref 1.5–6.5)
NEUT%: 70.8 % (ref 39.0–75.0)
Platelets: 250 10*3/uL (ref 140–400)
RBC: 4.04 10*6/uL — ABNORMAL LOW (ref 4.20–5.82)
RDW: 14.9 % — AB (ref 11.0–14.6)
WBC: 6.6 10*3/uL (ref 4.0–10.3)
lymph#: 1.2 10*3/uL (ref 0.9–3.3)

## 2015-04-07 LAB — COMPREHENSIVE METABOLIC PANEL (CC13)
ALT: 11 U/L (ref 0–55)
ANION GAP: 8 meq/L (ref 3–11)
AST: 16 U/L (ref 5–34)
Albumin: 3.8 g/dL (ref 3.5–5.0)
Alkaline Phosphatase: 90 U/L (ref 40–150)
BUN: 9.1 mg/dL (ref 7.0–26.0)
CO2: 32 meq/L — AB (ref 22–29)
CREATININE: 0.8 mg/dL (ref 0.7–1.3)
Calcium: 9.5 mg/dL (ref 8.4–10.4)
Chloride: 101 mEq/L (ref 98–109)
EGFR: 88 mL/min/{1.73_m2} — ABNORMAL LOW (ref >90)
GLUCOSE: 92 mg/dL (ref 70–140)
Potassium: 3.8 mEq/L (ref 3.5–5.1)
Sodium: 141 mEq/L (ref 136–145)
TOTAL PROTEIN: 7.8 g/dL (ref 6.4–8.3)

## 2015-04-07 MED ORDER — DIPHENHYDRAMINE HCL 50 MG/ML IJ SOLN
50.0000 mg | Freq: Once | INTRAMUSCULAR | Status: AC
  Administered 2015-04-07: 50 mg via INTRAVENOUS

## 2015-04-07 MED ORDER — PEGFILGRASTIM 6 MG/0.6ML ~~LOC~~ PSKT
6.0000 mg | PREFILLED_SYRINGE | Freq: Once | SUBCUTANEOUS | Status: AC
  Administered 2015-04-07: 6 mg via SUBCUTANEOUS
  Filled 2015-04-07: qty 0.6

## 2015-04-07 MED ORDER — ACETAMINOPHEN 325 MG PO TABS
ORAL_TABLET | ORAL | Status: AC
  Filled 2015-04-07: qty 2

## 2015-04-07 MED ORDER — SODIUM CHLORIDE 0.9 % IV SOLN
Freq: Once | INTRAVENOUS | Status: AC
  Administered 2015-04-07: 12:00:00 via INTRAVENOUS

## 2015-04-07 MED ORDER — SODIUM CHLORIDE 0.9 % IV SOLN
Freq: Once | INTRAVENOUS | Status: AC
  Administered 2015-04-07: 14:00:00 via INTRAVENOUS
  Filled 2015-04-07: qty 4

## 2015-04-07 MED ORDER — PEGFILGRASTIM 6 MG/0.6ML ~~LOC~~ PSKT
6.0000 mg | PREFILLED_SYRINGE | Freq: Once | SUBCUTANEOUS | Status: DC
  Filled 2015-04-07: qty 0.6

## 2015-04-07 MED ORDER — ACETAMINOPHEN 325 MG PO TABS
650.0000 mg | ORAL_TABLET | Freq: Once | ORAL | Status: AC
  Administered 2015-04-07: 650 mg via ORAL

## 2015-04-07 MED ORDER — RAMUCIRUMAB CHEMO INJECTION 500 MG/50ML
10.0000 mg/kg | Freq: Once | INTRAVENOUS | Status: AC
  Administered 2015-04-07: 800 mg via INTRAVENOUS
  Filled 2015-04-07: qty 80

## 2015-04-07 MED ORDER — DOCETAXEL CHEMO INJECTION 160 MG/16ML
75.0000 mg/m2 | Freq: Once | INTRAVENOUS | Status: AC
  Administered 2015-04-07: 150 mg via INTRAVENOUS
  Filled 2015-04-07: qty 15

## 2015-04-07 MED ORDER — DIPHENHYDRAMINE HCL 50 MG/ML IJ SOLN
INTRAMUSCULAR | Status: AC
  Filled 2015-04-07: qty 1

## 2015-04-07 NOTE — Patient Instructions (Signed)
Ramucirumab injection What is this medicine? RAMUCIRUMAB (ra mue SIR ue mab) is a chemotherapy drug. It is used to treat stomach cancer, colorectal cancer, or lung cancer. This drug targets a specific protein receptor on cancer cells and stops the cancer cells from growing. This medicine may be used for other purposes; ask your health care provider or pharmacist if you have questions. COMMON BRAND NAME(S): Cyramza What should I tell my health care provider before I take this medicine? They need to know if you have any of these conditions: -bleeding disorders -blood clots -heart disease, including heart failure, heart attack, or chest pain (angina) -high blood pressure -infection (especially a virus infection such as chickenpox, cold sores, or herpes) -protein in your urine -recent surgery -stroke -an unusual or allergic reaction to ramucirumab, other medicines, foods, dyes, or preservatives -pregnant or trying to get pregnant -breast-feeding How should I use this medicine? This medicine is for infusion into a vein. It is given by a health care professional in a hospital or clinic setting. Talk to your pediatrician regarding the use of this medicine in children. Special care may be needed. Overdosage: If you think you've taken too much of this medicine contact a poison control center or emergency room at once. Overdosage: If you think you have taken too much of this medicine contact a poison control center or emergency room at once. NOTE: This medicine is only for you. Do not share this medicine with others. What if I miss a dose? It is important not to miss your dose. Call your doctor or health care professional if you are unable to keep an appointment. What may interact with this medicine? Interactions have not been studied. This list may not describe all possible interactions. Give your health care provider a list of all the medicines, herbs, non-prescription drugs, or dietary  supplements you use. Also tell them if you smoke, drink alcohol, or use illegal drugs. Some items may interact with your medicine. What should I watch for while using this medicine? Your condition will be monitored carefully while you are receiving this medicine. You will need to to check your blood pressure and have your blood and urine tested while you are taking this medicine. Your condition will be monitored carefully while you are receiving this medicine. This medicine may increase your risk to bruise or bleed. Call your doctor or health care professional if you notice any unusual bleeding. This medicine may rarely cause 'gastrointestinal perforation' (holes in the stomach, intestines or colon), a serious side effect requiring surgery to repair. This medicine should be started at least 28 days following major surgery and the site of the surgery should be totally healed. Check with your doctor before scheduling dental work or surgery while you are receiving this treatment. Talk to your doctor if you have recently had surgery or if you have a wound that has not healed. Do not become pregnant while taking this medicine or for 3 months after stopping it. Women should inform their doctor if they wish to become pregnant or think they might be pregnant. There is a potential for serious side effects to an unborn child. Talk to your health care professional or pharmacist for more information. What side effects may I notice from receiving this medicine? Side effects that you should report to your doctor or health care professional as soon as possible: -allergic reactions like skin rash, itching or hives, breathing problems, swelling of the face, lips, or tongue -signs of infection - fever  or chills, cough, sore throat -chest pain or chest tightness -confusion -dizziness -feeling faint or lightheaded, falls -severe abdominal pain -severe nausea, vomiting -signs and symptoms of bleeding such as bloody or  black, tarry stools; red or dark-brown urine; spitting up blood or brown material that looks like coffee grounds; red spots on the skin; unusual bruising or bleeding from the eye, gums, or nose -signs and symptoms of a blood clot such as breathing problems; changes in vision; chest pain; severe, sudden headache; pain, swelling, warmth in the leg; trouble speaking; sudden numbness or weakness of the face, arm or leg -symptoms of a stroke: change in mental awareness, inability to talk or move one side of the body -trouble walking, dizziness, loss of balance or coordination Side effects that usually do not require medical attention (Report these to your doctor or health care professional if they continue or are bothersome.): -cold, clammy skin -constipation -diarrhea -headache -nausea, vomiting -stomach pain -unusually slow heartbeat -unusually weak or tired This list may not describe all possible side effects. Call your doctor for medical advice about side effects. You may report side effects to FDA at 1-800-FDA-1088. Where should I keep my medicine? This drug is given in a hospital or clinic and will not be stored at home. NOTE: This sheet is a summary. It may not cover all possible information. If you have questions about this medicine, talk to your doctor, pharmacist, or health care provider.  2015, Elsevier/Gold Standard. (2013-11-06 13:05:27)  Docetaxel injection What is this medicine? DOCETAXEL (doe se TAX el) is a chemotherapy drug. It targets fast dividing cells, like cancer cells, and causes these cells to die. This medicine is used to treat many types of cancers like breast cancer, certain stomach cancers, head and neck cancer, lung cancer, and prostate cancer. This medicine may be used for other purposes; ask your health care provider or pharmacist if you have questions. COMMON BRAND NAME(S): Docefrez, Taxotere What should I tell my health care provider before I take this  medicine? They need to know if you have any of these conditions: -infection (especially a virus infection such as chickenpox, cold sores, or herpes) -liver disease -low blood counts, like low white cell, platelet, or red cell counts -an unusual or allergic reaction to docetaxel, polysorbate 80, other chemotherapy agents, other medicines, foods, dyes, or preservatives -pregnant or trying to get pregnant -breast-feeding How should I use this medicine? This drug is given as an infusion into a vein. It is administered in a hospital or clinic by a specially trained health care professional. Talk to your pediatrician regarding the use of this medicine in children. Special care may be needed. Overdosage: If you think you have taken too much of this medicine contact a poison control center or emergency room at once. NOTE: This medicine is only for you. Do not share this medicine with others. What if I miss a dose? It is important not to miss your dose. Call your doctor or health care professional if you are unable to keep an appointment. What may interact with this medicine? -cyclosporine -erythromycin -ketoconazole -medicines to increase blood counts like filgrastim, pegfilgrastim, sargramostim -vaccines Talk to your doctor or health care professional before taking any of these medicines: -acetaminophen -aspirin -ibuprofen -ketoprofen -naproxen This list may not describe all possible interactions. Give your health care provider a list of all the medicines, herbs, non-prescription drugs, or dietary supplements you use. Also tell them if you smoke, drink alcohol, or use illegal drugs. Some  items may interact with your medicine. What should I watch for while using this medicine? Your condition will be monitored carefully while you are receiving this medicine. You will need important blood work done while you are taking this medicine. This drug may make you feel generally unwell. This is not  uncommon, as chemotherapy can affect healthy cells as well as cancer cells. Report any side effects. Continue your course of treatment even though you feel ill unless your doctor tells you to stop. In some cases, you may be given additional medicines to help with side effects. Follow all directions for their use. Call your doctor or health care professional for advice if you get a fever, chills or sore throat, or other symptoms of a cold or flu. Do not treat yourself. This drug decreases your body's ability to fight infections. Try to avoid being around people who are sick. This medicine may increase your risk to bruise or bleed. Call your doctor or health care professional if you notice any unusual bleeding. Be careful brushing and flossing your teeth or using a toothpick because you may get an infection or bleed more easily. If you have any dental work done, tell your dentist you are receiving this medicine. Avoid taking products that contain aspirin, acetaminophen, ibuprofen, naproxen, or ketoprofen unless instructed by your doctor. These medicines may hide a fever. This medicine contains an alcohol in the product. You may get drowsy or dizzy. Do not drive, use machinery, or do anything that needs mental alertness until you know how this medicine affects you. Do not stand or sit up quickly, especially if you are an older patient. This reduces the risk of dizzy or fainting spells. Avoid alcoholic drinks Do not become pregnant while taking this medicine. Women should inform their doctor if they wish to become pregnant or think they might be pregnant. There is a potential for serious side effects to an unborn child. Talk to your health care professional or pharmacist for more information. Do not breast-feed an infant while taking this medicine. What side effects may I notice from receiving this medicine? Side effects that you should report to your doctor or health care professional as soon as  possible: -allergic reactions like skin rash, itching or hives, swelling of the face, lips, or tongue -low blood counts - This drug may decrease the number of white blood cells, red blood cells and platelets. You may be at increased risk for infections and bleeding. -signs of infection - fever or chills, cough, sore throat, pain or difficulty passing urine -signs of decreased platelets or bleeding - bruising, pinpoint red spots on the skin, black, tarry stools, nosebleeds -signs of decreased red blood cells - unusually weak or tired, fainting spells, lightheadedness -breathing problems -fast or irregular heartbeat -low blood pressure -mouth sores -nausea and vomiting -pain, swelling, redness or irritation at the injection site -pain, tingling, numbness in the hands or feet -swelling of the ankle, feet, hands -weight gain Side effects that usually do not require medical attention (report to your prescriber or health care professional if they continue or are bothersome): -bone pain -complete hair loss including hair on your head, underarms, pubic hair, eyebrows, and eyelashes -diarrhea -excessive tearing -changes in the color of fingernails -loosening of the fingernails -nausea -muscle pain -red flush to skin -sweating -weak or tired This list may not describe all possible side effects. Call your doctor for medical advice about side effects. You may report side effects to FDA at 1-800-FDA-1088. Where  should I keep my medicine? This drug is given in a hospital or clinic and will not be stored at home. NOTE: This sheet is a summary. It may not cover all possible information. If you have questions about this medicine, talk to your doctor, pharmacist, or health care provider.  2015, Elsevier/Gold Standard. (2013-05-24 22:21:02)  [facility] Discharge Instructions for Patients Receiving Chemotherapy  Today you received the following chemotherapy agents  Cyramza/Taxotere.  To help prevent nausea and vomiting after your treatment, we encourage you to take your nausea medication as directed.   If you develop nausea and vomiting that is not controlled by your nausea medication, call the clinic.   BELOW ARE SYMPTOMS THAT SHOULD BE REPORTED IMMEDIATELY:  *FEVER GREATER THAN 100.5 F  *CHILLS WITH OR WITHOUT FEVER  NAUSEA AND VOMITING THAT IS NOT CONTROLLED WITH YOUR NAUSEA MEDICATION  *UNUSUAL SHORTNESS OF BREATH  *UNUSUAL BRUISING OR BLEEDING  TENDERNESS IN MOUTH AND THROAT WITH OR WITHOUT PRESENCE OF ULCERS  *URINARY PROBLEMS  *BOWEL PROBLEMS  UNUSUAL RASH Items with * indicate a potential emergency and should be followed up as soon as possible.  Feel free to call the clinic you have any questions or concerns. The clinic phone number is [phone].  Please show the [place] at check-in to the Emergency Department and triage nurse.

## 2015-04-08 ENCOUNTER — Telehealth: Payer: Self-pay | Admitting: *Deleted

## 2015-04-08 NOTE — Telephone Encounter (Signed)
-----   Message from Cheree Ditto, RN sent at 04/07/2015  5:06 PM EDT ----- Regarding: 1st time chemotherapy; Cyramza/Taxotere; Dr. Carollee Herter: [phone] 1st time chemotherapy; Cyramza/Taxotere; Dr. Jerilynn Mages. Jerilynn Mages.

## 2015-04-08 NOTE — Telephone Encounter (Signed)
Follow up call to pt to discuss any concerns regarding treatment Cyramza/ Taxotere Pt wife Mrs Bluitt advised pt is feeling well he is busy pressure washing the house today. No concerns at this time.

## 2015-04-09 ENCOUNTER — Telehealth: Payer: Self-pay

## 2015-04-09 NOTE — Telephone Encounter (Addendum)
Reviewed with MD who instructed pt to have wife take him to PCP to have BP checked and further evaluated. Called pt instructed pt to see PCP have BP checked and evaluated. Pt verbalized understanding and voiced he will call after he hangs up phone.

## 2015-04-09 NOTE — Telephone Encounter (Signed)
Pt wife reports chemo Monday, Onpro injected Tuesday 9/27.  Pt blood pressure has been : 9/27 - 190/101, 180/87, 170/80.  This am BP - 195/103 - husband told her systolic was over 937 but had come down.  Let pt know I would forward message to MD and desk RN and that someone would call her back.  Pt voiced understanding and requested call back on cell: [phone].  If no answer on cell, call home phone.  Forwarded to MD and desk RN for further action.

## 2015-04-10 ENCOUNTER — Ambulatory Visit: Payer: Medicare Other

## 2015-04-10 ENCOUNTER — Ambulatory Visit: Payer: Medicare Other | Admitting: Internal Medicine

## 2015-04-10 ENCOUNTER — Other Ambulatory Visit: Payer: Medicare Other

## 2015-04-11 ENCOUNTER — Encounter: Payer: Self-pay | Admitting: Internal Medicine

## 2015-04-14 ENCOUNTER — Telehealth: Payer: Self-pay | Admitting: *Deleted

## 2015-04-14 ENCOUNTER — Ambulatory Visit (HOSPITAL_BASED_OUTPATIENT_CLINIC_OR_DEPARTMENT_OTHER): Payer: Medicare Other | Admitting: Nurse Practitioner

## 2015-04-14 ENCOUNTER — Other Ambulatory Visit: Payer: Self-pay | Admitting: Medical Oncology

## 2015-04-14 VITALS — BP 145/123 | HR 102 | Temp 98.1°F | Resp 22

## 2015-04-14 DIAGNOSIS — G893 Neoplasm related pain (acute) (chronic): Secondary | ICD-10-CM | POA: Diagnosis not present

## 2015-04-14 DIAGNOSIS — I10 Essential (primary) hypertension: Secondary | ICD-10-CM | POA: Diagnosis not present

## 2015-04-14 DIAGNOSIS — C7931 Secondary malignant neoplasm of brain: Secondary | ICD-10-CM

## 2015-04-14 DIAGNOSIS — C3411 Malignant neoplasm of upper lobe, right bronchus or lung: Secondary | ICD-10-CM

## 2015-04-14 DIAGNOSIS — Z85118 Personal history of other malignant neoplasm of bronchus and lung: Secondary | ICD-10-CM

## 2015-04-14 DIAGNOSIS — J418 Mixed simple and mucopurulent chronic bronchitis: Secondary | ICD-10-CM

## 2015-04-14 DIAGNOSIS — C3490 Malignant neoplasm of unspecified part of unspecified bronchus or lung: Secondary | ICD-10-CM

## 2015-04-14 MED ORDER — HYDROCODONE-ACETAMINOPHEN 7.5-325 MG PO TABS: 1.0000 | ORAL_TABLET | Freq: Four times a day (QID) | ORAL | Status: DC | PRN

## 2015-04-14 NOTE — Telephone Encounter (Signed)
BACK PAIN STARTED YESTERDAY AT A SCALE OF NINE. PT. WAS HAVING DIFFICULTY WALKING. THIS MORNING THE BACK PAIN IS AT A SCALE OF SIX. PT.'S PHYSICIAN AT [facility] HAS SPOKEN WITH DR.MOHAMED. PT. WILL SEE [name],NP AT 11:00AM.

## 2015-04-14 NOTE — Progress Notes (Signed)
Per Julien Nordmann- pt reporting low back pain -please schedule [facility] today.Onc tx request sent.

## 2015-04-15 ENCOUNTER — Encounter: Payer: Self-pay | Admitting: Nurse Practitioner

## 2015-04-15 ENCOUNTER — Telehealth: Payer: Self-pay | Admitting: Nurse Practitioner

## 2015-04-15 ENCOUNTER — Other Ambulatory Visit: Payer: Self-pay | Admitting: Nurse Practitioner

## 2015-04-15 ENCOUNTER — Ambulatory Visit (HOSPITAL_COMMUNITY)
Admission: RE | Admit: 2015-04-15 | Discharge: 2015-04-15 | Disposition: A | Discharge status: Home / Self Care | Payer: Medicare Other | Source: Ambulatory Visit | Attending: Nurse Practitioner | Admitting: Nurse Practitioner

## 2015-04-15 DIAGNOSIS — M545 Low back pain: Secondary | ICD-10-CM | POA: Diagnosis not present

## 2015-04-15 DIAGNOSIS — M47814 Spondylosis without myelopathy or radiculopathy, thoracic region: Secondary | ICD-10-CM | POA: Insufficient documentation

## 2015-04-15 DIAGNOSIS — J90 Pleural effusion, not elsewhere classified: Secondary | ICD-10-CM | POA: Diagnosis not present

## 2015-04-15 DIAGNOSIS — M5124 Other intervertebral disc displacement, thoracic region: Secondary | ICD-10-CM | POA: Diagnosis not present

## 2015-04-15 DIAGNOSIS — C3490 Malignant neoplasm of unspecified part of unspecified bronchus or lung: Secondary | ICD-10-CM

## 2015-04-15 DIAGNOSIS — R590 Localized enlarged lymph nodes: Secondary | ICD-10-CM | POA: Diagnosis not present

## 2015-04-15 DIAGNOSIS — Z85118 Personal history of other malignant neoplasm of bronchus and lung: Secondary | ICD-10-CM | POA: Insufficient documentation

## 2015-04-15 MED ORDER — GADOBENATE DIMEGLUMINE 529 MG/ML IV SOLN
20.0000 mL | Freq: Once | INTRAVENOUS | Status: AC | PRN
  Administered 2015-04-15: 16 mL via INTRAVENOUS

## 2015-04-15 NOTE — Assessment & Plan Note (Signed)
Patient has undergone multiple chemotherapies and immunotherapy with the past several months; with his last restaging scans all showing some progression. Pt received his first cycle of Taxotere/Cyramza chemo on 04/07/15. 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   Patient continues with complaint of generalized fatigue and chronic shortness of breath.  He typically uses home O2 via nasal cannula at 2 L on a regular basis.  He has increased dyspnea with any exertion whatsoever.  On exam.  Patient does appear mildly short of breath; and has diminished breath sounds with wheezing to all lung fields.  Also, patient has a moderately firm mass to the base of his right neck just above his collarbone.  The area is nontender with palpation.  Patient does complain of some discomfort to this area when he turns his head to the left.  Patient's blood pressure was elevated while at the cancer Center today.  Patient states that he did not take his blood pressure medication as directed this morning.  Patient was encouraged to take all his blood pressure medications, his Lasix, and uses nebulizer 4 times per day as directed.  Patient stated that he would start taking his medications as directed.  Once he returns home.  Also, patient is complaining of some significantly increased lower back pain within the past several days.  He states that he has been taking the hydrocodone 7/325 with only minimal effectiveness.  We will order a thoracic/lumbar MRI for further evaluation of back pain.  Patient is scheduled to follow up with radiation oncologist Dr. Lisbeth Renshaw on 04/16/2015.  Patient is scheduled for labs, visit, and chemotherapy on 04/28/2015.

## 2015-04-15 NOTE — Assessment & Plan Note (Signed)
Patient reports significantly increased lower back pain within the past several days.  He denies any known injury or trauma to his back.  He denies any recent falls.  He denies any numbness/tingling or specific weakness to his legs.  He also denies any recent urine or stool incontinence.  On exam.-Patient with some very mild tenderness to his low back with palpation.  Patient was observed able to ambulate with assistance of a cane.  Will order a thoracic and lumbar MRI for further evaluation of areas of pain.  Patient was given a refill of his hydrocodone 7/325 medication today.

## 2015-04-15 NOTE — Assessment & Plan Note (Signed)
Patient blood pressure while cancer Center was elevated to 187/77.  Patient states that he did not take his blood pressure medications this morning; but plans to take his blood pressure medications as directed.  When he returns back home.  Advised patient to recheck his blood pressure once he takes his blood pressure medications; and let us know if his blood pressure remains elevated.

## 2015-04-15 NOTE — Telephone Encounter (Signed)
Called and spoke to both patient and his wife via speaker phone to review MRI of the lumbar spine scan results obtained earlier today.  Advised both patient and his wife that there was no evidence of metastatic disease in the lumbar spine.  There was some arthritis only.  Patient states that he is feeling some better today than when previously seen at the cancer center.  Patient has plans to return on 04/28/2015 for his next labs, visit, and chemotherapy.

## 2015-04-15 NOTE — Progress Notes (Signed)
SYMPTOM MANAGEMENT CLINIC   HPI: [name] 74 y.o. male diagnosed with lung cancer; with brain metastasis.  Patient is status post lobectomy; and multiple different chemotherapy and immunotherapy regimens in the past.  Currently undergoing taxotere/cyramza chemotherapy regimen.   Patient reports significantly increased lower back pain within the past several days.  He denies any known injury or trauma to his back.  He denies any recent falls.  He denies any numbness/tingling or specific weakness to his legs.  He also denies any recent urine or stool incontinence.  Patient denies any recent fevers or chills.  Back Pain    Review of Systems  Musculoskeletal: Positive for back pain.    Past Medical History  Diagnosis Date  . Arthritis   . Wheezing   . Sore throat   . Carotid artery occlusion   . COPD (chronic obstructive pulmonary disease) ([place])   . Lumbar disc disease   . Restless leg syndrome   . Allergic rhinitis   . Hypertension   . Anxiety     anxiety  . Shortness of breath   . Lung mass   . GERD (gastroesophageal reflux disease)   . H/O hiatal hernia   . Pre-operative cardiovascular examination   . Nonspecific abnormal unspecified cardiovascular function study   . Peripheral vascular disease, unspecified ([place])   . Pneumonia 2014    ?   Marland Kitchen On home oxygen therapy     prn  . Constipation   . Cancer ([place]) 09/14/12    invasive mod diff squamous cell ca  . 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 S/P radiation therapy 12/04/14 srs    lt frontal brain    Past Surgical History  Procedure Laterality Date  . Carotid endarterectomy  10/15/2010    left  . Hand surgery      repair of the left long, ring, and small finger after a saw injury  . Video bronchoscopy  07/24/2012    Procedure: VIDEO BRONCHOSCOPY WITH FLUORO;  Surgeon: Kathee Delton, MD;  Location: [facility];  Service: Cardiopulmonary;  Laterality: Bilateral;  . Lobectomy Right 09/14/2012   Procedure: LOBECTOMY;  Surgeon: Ivin Poot, MD;  Location: [place];  Service: Thoracic;  Laterality: Right;  RIGHT UPPER LOBECTOMY  . Video assisted thoracoscopy Right 09/14/2012    Procedure: VIDEO ASSISTED THORACOSCOPY;  Surgeon: Ivin Poot, MD;  Location: [place];  Service: Thoracic;  Laterality: Right;  . Video bronchoscopy with endobronchial ultrasound N/A 08/27/2014    Procedure: VIDEO BRONCHOSCOPY WITH ENDOBRONCHIAL ULTRASOUND;  Surgeon: Ivin Poot, MD;  Location: [facility] OR;  Service: Thoracic;  Laterality: N/A;  . [place] node biopsy Right 08/27/2014    Procedure: BIOPSY SCALENE NODE;  Surgeon: Ivin Poot, MD;  Location: [facility] OR;  Service: Thoracic;  Laterality: Right;  . Sterotactic radiosurgery Right 09/25/14    right parietal 30Gy/1 fx    has Occlusion and stenosis of carotid artery without mention of cerebral infarction; COPD (chronic obstructive pulmonary disease) ([place]); Lung mass; Pre-operative cardiovascular examination; Nonspecific abnormal unspecified cardiovascular function study; Peripheral vascular disease, unspecified ([place]); Lung cancer ([place]); Pain in limb; Coronary atherosclerosis of native coronary artery; Essential hypertension, benign; Tobacco use disorder; Carotid stenosis; Aftercare following surgery of the circulatory system; Weakness-Hand and  Legs; COPD exacerbation ([place]); Tachycardia; Dyspnea; Brain metastasis ([place]); Chemotherapy induced neutropenia ([place]); Renal stone; Pyelonephritis; Dehydration; UTI (lower urinary tract infection); Left arm cellulitis; Blood poisoning ([place]); Sepsis ([place]); Encounter for  antineoplastic immunotherapy; Cancer associated pain; Rash; Syncope; Constipation; Insomnia; Hypokalemia; and Peripheral edema on his problem list.    has No Known Allergies.    Medication List       This list is accurate as of: 04/14/15 11:59 PM.  Always use your most recent med list.               albuterol (2.5 MG/3ML) 0.083% nebulizer solution    Commonly known as:  PROVENTIL  Inhale 2.5 mg into the lungs every 6 (six) hours as needed.     amLODipine 10 MG tablet  Commonly known as:  NORVASC  Take 1 tablet (10 mg total) by mouth daily.     aspirin EC 81 MG tablet  Take 81 mg by mouth daily.     clonazePAM 0.5 MG tablet  Commonly known as:  KLONOPIN  Take 0.5 mg by mouth 2 (two) times daily as needed for anxiety. Takes one tablet by mouth two times daily as needed for anxiety.     furosemide 20 MG tablet  Commonly known as:  LASIX  Take 20 mg by mouth daily.     HYDROcodone-acetaminophen 7.5-325 MG tablet  Commonly known as:  NORCO  Take 1 tablet by mouth every 6 (six) hours as needed for moderate pain.     ipratropium-albuterol 0.5-2.5 (3) MG/3ML Soln  Commonly known as:  DUONEB  Take 3 mLs by nebulization 4 (four) times daily.     isosorbide mononitrate 30 MG 24 hr tablet  Commonly known as:  IMDUR  Take 1 tablet (30 mg total) by mouth daily.     LORazepam 0.5 MG tablet  Commonly known as:  ATIVAN  Take 1 tablet by mouth or sublingually every 8 hours as needed for nausea. May take 1 tablet by mouth at bedtime.     metoprolol tartrate 25 MG tablet  Commonly known as:  LOPRESSOR  Take 25 mg by mouth 2 (two) times daily.     omeprazole 40 MG capsule  Commonly known as:  PRILOSEC  Take 40 mg by mouth daily.     potassium chloride SA 20 MEQ tablet  Commonly known as:  K-DUR,KLOR-CON  Take 1 tablet (20 mEq total) by mouth daily.     potassium chloride SA 20 MEQ tablet  Commonly known as:  K-DUR,KLOR-CON  Take 1 tablet (20 mEq total) by mouth daily.     pramipexole 1 MG tablet  Commonly known as:  MIRAPEX  Take 1 mg by mouth at bedtime.     traMADol 50 MG tablet  Commonly known as:  ULTRAM  Take 1 tablet (50 mg total) by mouth every 12 (twelve) hours as needed.     zolpidem 10 MG tablet  Commonly known as:  AMBIEN  Take 10 mg by mouth at bedtime as needed for sleep.         PHYSICAL  EXAMINATION  Oncology Vitals 04/14/2015 04/07/2015 04/07/2015 04/07/2015 04/07/2015 04/07/2015 04/07/2015  Height - - - - - - -  Weight - - - - - - -  Weight (lbs) - - - - - - -  BMI (kg/m2) - - - - - - -  Temp 98.1 98 98 97.9 98 97.9 97.9  Pulse 102 68 64 65 69 65 65  Resp _0 SpO2 98 100 100 100 100 100 99  BSA (m2) - - - - - - -   BP Readings from Last 3 Encounters:  04/14/15 145/123  04/07/15 102/75  04/02/15 197/78    Physical Exam  Constitutional: He is oriented to person, place, and time. He appears malnourished. He appears unhealthy. He appears cachectic.  Patient appears fatigued, weak, frail, thin, and chronically ill.  HENT:  Head: Normocephalic and atraumatic.  Mouth/Throat: Oropharynx is clear and moist.  Right neck just above the clavicle with moderately firm mass that is nontender.  Eyes: Conjunctivae and EOM are normal. Pupils are equal, round, and reactive to light. Right eye exhibits no discharge. Left eye exhibits no discharge. No scleral icterus.  Neck: Normal range of motion. Neck supple. No JVD present. No tracheal deviation present. No thyromegaly present.  Cardiovascular: Normal rate, regular rhythm, normal heart sounds and intact distal pulses.   Pulmonary/Chest: No stridor. No respiratory distress. He has wheezes. He has no rales. He exhibits no tenderness.  Decreased breath sounds bilaterally; with wheezes throughout.  Mild shortness of breath noted.  Patient on 2 L O2 via nasal cannula.  Abdominal: Soft. Bowel sounds are normal. He exhibits no distension and no mass. There is no tenderness. There is no rebound and no guarding.  Musculoskeletal: Normal range of motion. He exhibits edema and tenderness.  +1 edema to bilateral ankles.  Mild tenderness to lower back with palp.   Lymphadenopathy:    He has no cervical adenopathy.  Neurological: He is alert and oriented to person, place, and time. Gait normal.  Skin: Skin is warm and dry. No  rash noted. No erythema. There is pallor.  Psychiatric: Affect normal.  Nursing note and vitals reviewed.   LABORATORY DATA:. No visits with results within 3 Day(s) from this visit. Latest known visit with results is:  Appointment on 04/07/2015  Component Date Value Ref Range Status  . WBC 04/07/2015 6.6  4.0 - 10.3 10e3/uL Final  . NEUT# 04/07/2015 4.7  1.5 - 6.5 10e3/uL Final  . HGB 04/07/2015 12.5* 13.0 - 17.1 g/dL Final  . HCT 04/07/2015 37.4* 38.4 - 49.9 % Final  . Platelets 04/07/2015 250  140 - 400 10e3/uL Final  . MCV 04/07/2015 92.7  79.3 - 98.0 fL Final  . MCH 04/07/2015 30.9  27.2 - 33.4 pg Final  . MCHC 04/07/2015 33.3  32.0 - 36.0 g/dL Final  . RBC 04/07/2015 4.04* 4.20 - 5.82 10e6/uL Final  . RDW 04/07/2015 14.9* 11.0 - 14.6 % Final  . lymph# 04/07/2015 1.2  0.9 - 3.3 10e3/uL Final  . MONO# 04/07/2015 0.6  0.1 - 0.9 10e3/uL Final  . Eosinophils Absolute 04/07/2015 0.1  0.0 - 0.5 10e3/uL Final  . Basophils Absolute 04/07/2015 0.0  0.0 - 0.1 10e3/uL Final  . NEUT% 04/07/2015 70.8  39.0 - 75.0 % Final  . LYMPH% 04/07/2015 18.7  14.0 - 49.0 % Final  . MONO% 04/07/2015 8.4  0.0 - 14.0 % Final  . EOS% 04/07/2015 1.5  0.0 - 7.0 % Final  . BASO% 04/07/2015 0.6  0.0 - 2.0 % Final  . Sodium 04/07/2015 141  136 - 145 mEq/L Final  . Potassium 04/07/2015 3.8  3.5 - 5.1 mEq/L Final  . Chloride 04/07/2015 101  98 - 109 mEq/L Final  . CO2 04/07/2015 32* 22 - 29 mEq/L Final  . Glucose 04/07/2015 92  70 - 140 mg/dl Final   Glucose reference range is for nonfasting patients. Fasting glucose reference range is 70- 100.  Marland Kitchen BUN 04/07/2015 9.1  7.0 - 26.0 mg/dL Final  . Creatinine 04/07/2015 0.8  0.7 - 1.3  mg/dL Final  . Total Bilirubin 04/07/2015 <0.30  0.20 - 1.20 mg/dL Final  . Alkaline Phosphatase 04/07/2015 90  40 - 150 U/L Final  . AST 04/07/2015 16  5 - 34 U/L Final  . ALT 04/07/2015 11  0 - 55 U/L Final  . Total Protein 04/07/2015 7.8  6.4 - 8.3 g/dL Final  . Albumin  04/07/2015 3.8  3.5 - 5.0 g/dL Final  . Calcium 04/07/2015 9.5  8.4 - 10.4 mg/dL Final  . Anion Gap 04/07/2015 8  3 - 11 mEq/L Final  . EGFR 04/07/2015 88* >90 ml/min/1.73 m2 Final   eGFR is calculated using the CKD-EPI Creatinine Equation (2009)     RADIOGRAPHIC STUDIES: Mr Thoracic Spine W Wo Contrast  04/15/2015   CLINICAL DATA:  Severe back pain.  History of lung cancer.  EXAM: MRI THORACIC SPINE WITHOUT AND WITH CONTRAST  TECHNIQUE: Multiplanar and multiecho pulse sequences of the thoracic spine were obtained without and with intravenous contrast.  CONTRAST:  31m MULTIHANCE GADOBENATE DIMEGLUMINE 529 MG/ML IV SOLN  COMPARISON:  None.  FINDINGS: There is a mild chronic anterior T3 vertebral body compression fracture without marrow signal abnormality. The alignment is anatomic. There is no acute fracture or static listhesis. The marrow signal is normal. The thoracic spinal cord is normal in size and signal. The disc spaces are maintained.  Small right pleural effusion. Enlarged right infraclavicular lymph node partially visualized measuring approximately 19 mm in short axis.  T1-T2: No significant disc bulge, foraminal stenosis or central canal stenosis.  T2-T3: Mild broad-based disc bulge. No foraminal or central canal stenosis.  T3-T4: No significant disc bulge, foraminal stenosis or central canal stenosis.  T4-T5: No significant disc bulge, foraminal stenosis or central canal stenosis.  T5-T6: Small central disc protrusion. No foraminal or central canal stenosis.  T6-T7: Small central disc protrusion impressing on the ventral thecal sac. No foraminal or central canal stenosis.  T7-T8: Tiny central disc protrusion. No foraminal or central canal stenosis.  T8-T9: Shallow right paracentral disc protrusion. No foraminal or central canal stenosis.  T9-T10: No significant disc bulge, foraminal stenosis or central canal stenosis.  T10-T11: No significant disc bulge, foraminal stenosis or central canal  stenosis.  T11-T12: No significant disc bulge, foraminal stenosis or central canal stenosis.  IMPRESSION: 1. No evidence of metastatic disease of the lumbar spine. 2. Thoracic spine spondylosis as described above. 3. Small right pleural effusion. Enlarged right infraclavicular lymph node partially visualized measuring approximately 19 mm in short axis.   Electronically Signed   By: HKathreen Devoid  On: 04/15/2015 08:22   Mr Lumbar Spine W Wo Contrast  04/15/2015   CLINICAL DATA:  Low back pain, history of lung cancer  EXAM: MRI LUMBAR SPINE WITHOUT AND WITH CONTRAST  TECHNIQUE: Multiplanar and multiecho pulse sequences of the lumbar spine were obtained without and with intravenous contrast.  CONTRAST:  148mMULTIHANCE GADOBENATE DIMEGLUMINE 529 MG/ML IV SOLN  COMPARISON:  None.  FINDINGS: The vertebral bodies of the lumbar spine are normal in size. There is grade 1 anterolisthesis of L5 on S1 secondary to bilateral L5 pars interarticularis defects. There is normal bone marrow signal demonstrated throughout the vertebra. There is degenerative disc disease with disc height loss at L2-3, L3-4 L4-5 and L5-S1.  The spinal cord is normal in signal and contour. The cord terminates normally at T12 . The nerve roots of the cauda equina and the filum terminale are normal.  The visualized portions of the SI  joints are unremarkable.  The imaged intra-abdominal contents are unremarkable.  T12-L1: No significant disc bulge. No evidence of neural foraminal stenosis. No central canal stenosis.  L1-L2: Mild broad-based disc bulge. No evidence of neural foraminal stenosis. No central canal stenosis.  L2-L3: Mild broad-based disc osteophyte complex. Mild bilateral facet arthropathy. Mild spinal stenosis. No evidence of neural foraminal stenosis.  L3-L4: Mild broad-based disc bulge eccentric towards the right. Mild bilateral facet arthropathy. Moderate right foraminal stenosis. No left foraminal stenosis. Mild spinal stenosis.  L4-L5:  Mild broad-based disc bulge with a left foraminal/lateral disc protrusion with moderate left foraminal stenosis. No right foraminal stenosis. Moderate bilateral facet arthropathy. No central canal stenosis.  L5-S1: Grade 1 anterolisthesis with a mild broad-based disc bulge. Moderate bilateral foraminal stenosis. No central canal stenosis.  IMPRESSION: 1. No evidence of metastatic disease of the lumbar spine. 2. At L3-4 there is a mild broad-based disc bulge eccentric towards the right. Mild bilateral facet arthropathy. Moderate right foraminal stenosis. No left foraminal stenosis. Mild spinal stenosis. 3. At L4-5 there is a mild broad-based disc bulge with a left foraminal/lateral disc protrusion with moderate left foraminal stenosis. No right foraminal stenosis. Moderate bilateral facet arthropathy. 4. There is grade 1 anterolisthesis of L5 on S1 secondary to bilateral L5 pars interarticularis defects. There is a mild broad-based disc bulge. Moderate bilateral foraminal stenosis.   Electronically Signed   By: Kathreen Devoid   On: 04/15/2015 08:29    ASSESSMENT/PLAN:    Lung cancer Patient has undergone multiple chemotherapies and immunotherapy with the past several months; with his last restaging scans all showing some progression. Pt received his first cycle of Taxotere/Cyramza chemo on 04/07/15. 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   Patient continues with complaint of generalized fatigue and chronic shortness of breath.  He typically uses home O2 via nasal cannula at 2 L on a regular basis.  He has increased dyspnea with any exertion whatsoever.  On exam.  Patient does appear mildly short of breath; and has diminished breath sounds with wheezing to all lung fields.  Also, patient has a moderately firm mass to the base of his right neck just above his collarbone.  The area is nontender with palpation.  Patient does complain of some discomfort to this area when he turns his head  to the left.  Patient's blood pressure was elevated while at the cancer Center today.  Patient states that he did not take his blood pressure medication as directed this morning.  Patient was encouraged to take all his blood pressure medications, his Lasix, and uses nebulizer 4 times per day as directed.  Patient stated that he would start taking his medications as directed.  Once he returns home.  Also, patient is complaining of some significantly increased lower back pain within the past several days.  He states that he has been taking the hydrocodone 7/325 with only minimal effectiveness.  We will order a thoracic/lumbar MRI for further evaluation of back pain.  Patient is scheduled to follow up with radiation oncologist Dr. Lisbeth Renshaw on 04/16/2015.  Patient is scheduled for labs, visit, and chemotherapy on 04/28/2015.           Essential hypertension, benign Patient blood pressure while cancer Center was elevated to 187/77.  Patient states that he did not take his blood pressure medications this morning; but plans to take his blood pressure medications as directed.  When he returns back home.  Advised patient to recheck his  blood pressure once he takes his blood pressure medications; and let us know if his blood pressure remains elevated.  Cancer associated pain Patient reports significantly increased lower back pain within the past several days.  He denies any known injury or trauma to his back.  He denies any recent falls.  He denies any numbness/tingling or specific weakness to his legs.  He also denies any recent urine or stool incontinence.  On exam.-Patient with some very mild tenderness to his low back with palpation.  Patient was observed able to ambulate with assistance of a cane.  Will order a thoracic and lumbar MRI for further evaluation of areas of pain.  Patient was given a refill of his hydrocodone 7/325 medication today.   Patient stated understanding of all  instructions; and was in agreement with this plan of care. The patient knows to call the clinic with any problems, questions or concerns.   This was a shared visit with [name] today.  Total time spent with patient was 25 minutes;  with greater than 75 percent of that time spent in face to face counseling regarding patient's symptoms,  and coordination of care and follow up.  Disclaimer:This dictation was prepared with Dragon/digital dictation along with [employer]. Any transcriptional errors that result from this process are unintentional.  Drue Second, NP 04/15/2015   ADDENDUM: Hematology/Oncology Attending: I had a face to face encounter with the patient. I recommended his care plan. This is a very pleasant 74 years old white male with metastatic non-small cell lung cancer, squamous cell carcinoma who is currently undergoing systemic chemotherapy with docetaxel and Cyramza status post 1 cycle and tolerated the first treatment fairly well. The patient was evaluated for enrollment in the clinical trial S 1400 at [facility] but there was no actionable mutation. Has been complaining of low back pain started recently and was getting worse. This is most likely secondary to his recent Neulasta injection but metastatic bone disease could not be ruled out at this point. His last CT scan of the chest, abdomen and pelvis showed no evidence for disease progression and no significant bone metastasis. I recommended for the patient to have MRI of the thoracolumbar spine to rule out any metastatic disease in this area. If the patient has no metastatic lesion, he would come back for follow-up visit in 2 weeks for reevaluation before starting cycle #2. The patient will continue with his current pain medication for now. He was advised to call immediately if he has any concerning symptoms in the interval.  Disclaimer: This note was dictated with voice recognition software. Similar sounding  words can inadvertently be transcribed and may be missed upon review. Eilleen Kempf., MD 04/15/2015

## 2015-04-16 ENCOUNTER — Ambulatory Visit
Admission: RE | Admit: 2015-04-16 | Discharge: 2015-04-16 | Disposition: A | Discharge status: Home / Self Care | Payer: Medicare Other | Source: Ambulatory Visit | Attending: Radiation Oncology | Admitting: Radiation Oncology

## 2015-04-16 DIAGNOSIS — C7931 Secondary malignant neoplasm of brain: Secondary | ICD-10-CM

## 2015-04-16 NOTE — Progress Notes (Addendum)
Follow up s/p [facility], on 03/12/15, saqw [name] ,NP yesterday low back pain,  Had MRI  Spine  And lumbar, results in and was told resultas, head at tiomes feels like it is going to explode,  Stated, no pain, eysegetting worse since started cjemotherapy , appetite fair,not on decadron, head acjhe back of head lasted 1/2 day yesteray, took tramadol  Helped some, on oxygen 2 liters n/c, congested cough, yellow phelgm coughing up,fatigue,  10:56 AM There were no vitals taken for this visit.  Wt Readings from Last 3 Encounters:  04/02/15 177 lb 1.6 oz (80.332 kg)  03/20/15 176 lb 14.4 oz (80.241 kg)  03/05/15 180 lb 1.6 oz (81.693 kg)

## 2015-04-16 NOTE — Progress Notes (Signed)
Radiation Oncology         [phone] ________________________________  Name: [name] MRN: [id number]  Date: 04/16/2015  DOB: [birth year]  Follow-Up Visit Note  CC: [name], MD  [name], MD  Diagnosis: Metastatic non-small cell lung cancer, squamous cell carcinoma initially diagnosed as Stage IIA (T2b., N0, M0) non-small cell lung cancer, squamous cell carcinoma diagnosed in December of 2013   Interval Since Last Radiation:  03/12/15, palliative treatment to the brain  Narrative:  The patient returns today for routine follow-up. Follow up s/p [facility] Brain, on 03/12/15, saqw [name] ,NP yesterday low back pain, Had MRI Spine And lumbar, results in and was told results, head at times feels like it is going to explode, Stated, no pain, eyes getting worse since started chemotherapy ,appetite fair, not on decadron, head ache back of head lasted 1/2 day yesteray, took tramadol Helped some, on oxygen 2 liters n/c, congested cough, yellow phelgm coughing up, fatigue.    Patient notes uncontrolable extremity "Parkinson's"-like shaking starting about a week ago, occuring after chemotherapy. This has occurred intermittently since. Shaking was severe enough that his wife didn't allow him to drive and had to use a walker.                            ALLERGIES:  has No Known Allergies.  Meds: Current Outpatient Prescriptions  Medication Sig Dispense Refill  . albuterol (PROVENTIL) (2.5 MG/3ML) 0.083% nebulizer solution Inhale 2.5 mg into the lungs every 6 (six) hours as needed.    Marland Kitchen amLODipine (NORVASC) 10 MG tablet Take 1 tablet (10 mg total) by mouth daily. 90 tablet 3  . aspirin EC 81 MG tablet Take 81 mg by mouth daily.     . clonazePAM (KLONOPIN) 0.5 MG tablet Take 0.5 mg by mouth 2 (two) times daily as needed for anxiety. Takes one tablet by mouth two times daily as needed for anxiety.    . furosemide (LASIX) 20 MG tablet Take 20 mg by mouth daily.    Marland Kitchen  HYDROcodone-acetaminophen (NORCO) 7.5-325 MG tablet Take 1 tablet by mouth every 6 (six) hours as needed for moderate pain. 45 tablet 0  . ipratropium-albuterol (DUONEB) 0.5-2.5 (3) MG/3ML SOLN Take 3 mLs by nebulization 4 (four) times daily. (Patient taking differently: Take 3 mLs by nebulization 4 (four) times daily. Pt takes nebulizer 2 to 3 times daily.) 360 mL 2  . isosorbide mononitrate (IMDUR) 30 MG 24 hr tablet Take 1 tablet (30 mg total) by mouth daily. 30 tablet 3  . metoprolol tartrate (LOPRESSOR) 25 MG tablet Take 25 mg by mouth 2 (two) times daily.     Marland Kitchen omeprazole (PRILOSEC) 40 MG capsule Take 40 mg by mouth daily.    . potassium chloride SA (K-DUR,KLOR-CON) 20 MEQ tablet Take 1 tablet (20 mEq total) by mouth daily. 30 tablet 1  . potassium chloride SA (K-DUR,KLOR-CON) 20 MEQ tablet Take 1 tablet (20 mEq total) by mouth daily. 7 tablet 0  . pramipexole (MIRAPEX) 1 MG tablet Take 1 mg by mouth at bedtime.    . traMADol (ULTRAM) 50 MG tablet Take 1 tablet (50 mg total) by mouth every 12 (twelve) hours as needed. 30 tablet 0  . zolpidem (AMBIEN) 10 MG tablet Take 10 mg by mouth at bedtime as needed for sleep.    Marland Kitchen LORazepam (ATIVAN) 0.5 MG tablet Take 1 tablet by mouth or sublingually every 8 hours as  needed for nausea. May take 1 tablet by mouth at bedtime. (Patient not taking: Reported on 03/20/2015) 40 tablet 0   No current facility-administered medications for this encounter.   Facility-Administered Medications Ordered in Other Encounters  Medication Dose Route Frequency Provider Last Rate Last Dose  . sodium chloride 0.9 % injection 10 mL  10 mL Intracatheter PRN Curt Bears, MD   10 mL at 01/29/15 7619    Physical Findings: The patient is in no acute distress. Patient is alert and oriented.  vitals were not taken for this visit..   Using supplemental oxygen.  No discrete swelling or nodularity or abnormal palpable findings. No pain upon palpitation.  Lab Findings: Lab  Results  Component Value Date   WBC 6.6 04/07/2015   HGB 12.5* 04/07/2015   HCT 37.4* 04/07/2015   MCV 92.7 04/07/2015   PLT 250 04/07/2015     Radiographic Findings: Ct Chest W Contrast  04/04/2015   CLINICAL DATA:  Restaging right upper lobe lung cancer. Chronic shortness of breath. Home oxygen. Intracranial metastatic disease.  EXAM: CT CHEST, ABDOMEN, AND PELVIS WITH CONTRAST  TECHNIQUE: Multidetector CT imaging of the chest, abdomen and pelvis was performed following the standard protocol during bolus administration of intravenous contrast.  CONTRAST:  165m OMNIPAQUE IOHEXOL 300 MG/ML SOLN, 556mOMNIPAQUE IOHEXOL 300 MG/ML SOLN  COMPARISON:  Multiple exams, including 02/10/2015  FINDINGS: CT CHEST FINDINGS  Mediastinum/Nodes: A previous 8 mm right paratracheal lymph node is no longer present. Discontinued airway thickening and loss of fat planes along the right hilum similar to prior with postoperative findings in this vicinity. A right axillary lymph node measures 1.0 cm in short axis, formerly 1.2 cm. Clips are noted below the right latissimus dorsi muscle.  A right supraclavicular node measures 1.9 cm in short axis on image 3 series 2, formerly 1.6 cm by my measurement.  Coronary, aortic arch, and branch vessel atherosclerotic vascular disease.  Lungs/Pleura: Prior right upper lobectomy. Frothy fluid in the lower trachea and especially extending in the residual right tracheobronchial tree. Airway thickening centrally along the right tracheobronchial tree as before, with a milder degree of airway thickening distally.  There was previously a 6 by 4 mm right lower lobe pulmonary nodule laterally ; currently this measures 4 by 2 mm on image 40 series 4. The irregular nodule posteromedially along the left lower lobe on image 36 of series 4 of the prior exam has essentially resolved with only faint residual ground-glass opacity in this vicinity. There is also nodularity in the left lung which is  improved. For example, the 1.2 cm peripheral left lower lobe nodule shown previously has resolved. A 2.4 by 0.9 cm left lower lobe nodule now measures 1.8 by 0.5 cm in the posterior basal segment left lower lobe, image 49 series 4.  Underlying emphysema. No new or progressive lung nodule is identified.  Musculoskeletal: Old healed left lateral rib fractures. 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  CT ABDOMEN PELVIS FINDINGS  Hepatobiliary: Mildly contracted gallbladder.  Pancreas: Unremarkable  Spleen: Unremarkable  Adrenals/Urinary Tract: Reduced size of the right kidney upper pole mass. This mass is somewhat infiltrative and difficult to measure on today's exam. There is hypo enhancement in the right kidney upper pole as well as some generalized asymmetry of the delayed phase enhancement on the right, specifically with less contrast blush in the renal papilla on the right compared to the left in throughout the kidney. Conceivably this may be due to some asymmetry  in arterial flow given the severity of the proximal renal artery atherosclerotic calcification. However, there may also be some altered flow related to the presumed tumor in the right kidney upper pole.  Stomach/Bowel: Unremarkable  Vascular/Lymphatic: Severe aortoiliac atherosclerotic vascular disease. No pathologic adenopathy identified.  Reproductive: Unremarkable  Other: No supplemental non-categorized findings.  Musculoskeletal: Levoconvex lumbar scoliosis with rotary component. Lumbar spondylosis and degenerative disc disease resulting in right foraminal impingement at L3-4 and L5-S1, and left foraminal impingement at L4-5 and L5-S1. 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  IMPRESSION: 1. The right kidney upper pole mass and many of the scattered pulmonary nodules are generally improved in size compared to the prior exam. This would seem to indicate some interval effective therapy. 2.  There is some asymmetry in delayed phase enhancement of the kidneys potentially partially attributable to the right kidney upper pole mass/process, although possibly vascular given the severity of the proximal renal artery atherosclerosis. 3. Frothy mucous in the distal trachea and the right tracheobronchial tree. Airway thickening especially on the right. 4. Emphysema. 5. Lumbar spondylosis and degenerative disc disease causing multilevel impingement. 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   Electronically Signed   By: Van Clines M.D.   On: 04/04/2015 17:14   Mr Thoracic Spine W Wo Contrast  04/15/2015   CLINICAL DATA:  Severe back pain.  History of lung cancer.  EXAM: MRI THORACIC SPINE WITHOUT AND WITH CONTRAST  TECHNIQUE: Multiplanar and multiecho pulse sequences of the thoracic spine were obtained without and with intravenous contrast.  CONTRAST:  70m MULTIHANCE GADOBENATE DIMEGLUMINE 529 MG/ML IV SOLN  COMPARISON:  None.  FINDINGS: There is a mild chronic anterior T3 vertebral body compression fracture without marrow signal abnormality. The alignment is anatomic. There is no acute fracture or static listhesis. The marrow signal is normal. The thoracic spinal cord is normal in size and signal. The disc spaces are maintained.  Small right pleural effusion. Enlarged right infraclavicular lymph node partially visualized measuring approximately 19 mm in short axis.  T1-T2: No significant disc bulge, foraminal stenosis or central canal stenosis.  T2-T3: Mild broad-based disc bulge. No foraminal or central canal stenosis.  T3-T4: No significant disc bulge, foraminal stenosis or central canal stenosis.  T4-T5: No significant disc bulge, foraminal stenosis or central canal stenosis.  T5-T6: Small central disc protrusion. No foraminal or central canal stenosis.  T6-T7: Small central disc protrusion impressing on the ventral thecal sac. No foraminal or central canal stenosis.  T7-T8: Tiny central disc  protrusion. No foraminal or central canal stenosis.  T8-T9: Shallow right paracentral disc protrusion. No foraminal or central canal stenosis.  T9-T10: No significant disc bulge, foraminal stenosis or central canal stenosis.  T10-T11: No significant disc bulge, foraminal stenosis or central canal stenosis.  T11-T12: No significant disc bulge, foraminal stenosis or central canal stenosis.  IMPRESSION: 1. No evidence of metastatic disease of the lumbar spine. 2. Thoracic spine spondylosis as described above. 3. Small right pleural effusion. Enlarged right infraclavicular lymph node partially visualized measuring approximately 19 mm in short axis.   Electronically Signed   By: HKathreen Devoid  On: 04/15/2015 08:22   Mr Lumbar Spine W Wo Contrast  04/15/2015   CLINICAL DATA:  Low back pain, history of lung cancer  EXAM: MRI LUMBAR SPINE WITHOUT AND WITH CONTRAST  TECHNIQUE: Multiplanar and multiecho pulse sequences of the lumbar spine were obtained without and with intravenous contrast.  CONTRAST:  84m MULTIHANCE GADOBENATE DIMEGLUMINE 529 MG/ML IV SOLN  COMPARISON:  None.  FINDINGS: The vertebral bodies of the lumbar spine are normal in size. There is grade 1 anterolisthesis of L5 on S1 secondary to bilateral L5 pars interarticularis defects. There is normal bone marrow signal demonstrated throughout the vertebra. There is degenerative disc disease with disc height loss at L2-3, L3-4 L4-5 and L5-S1.  The spinal cord is normal in signal and contour. The cord terminates normally at T12 . The nerve roots of the cauda equina and the filum terminale are normal.  The visualized portions of the SI joints are unremarkable.  The imaged intra-abdominal contents are unremarkable.  T12-L1: No significant disc bulge. No evidence of neural foraminal stenosis. No central canal stenosis.  L1-L2: Mild broad-based disc bulge. No evidence of neural foraminal stenosis. No central canal stenosis.  L2-L3: Mild broad-based disc osteophyte  complex. Mild bilateral facet arthropathy. Mild spinal stenosis. No evidence of neural foraminal stenosis.  L3-L4: Mild broad-based disc bulge eccentric towards the right. Mild bilateral facet arthropathy. Moderate right foraminal stenosis. No left foraminal stenosis. Mild spinal stenosis.  L4-L5: Mild broad-based disc bulge with a left foraminal/lateral disc protrusion with moderate left foraminal stenosis. No right foraminal stenosis. Moderate bilateral facet arthropathy. No central canal stenosis.  L5-S1: Grade 1 anterolisthesis with a mild broad-based disc bulge. Moderate bilateral foraminal stenosis. No central canal stenosis.  IMPRESSION: 1. No evidence of metastatic disease of the lumbar spine. 2. At L3-4 there is a mild broad-based disc bulge eccentric towards the right. Mild bilateral facet arthropathy. Moderate right foraminal stenosis. No left foraminal stenosis. Mild spinal stenosis. 3. At L4-5 there is a mild broad-based disc bulge with a left foraminal/lateral disc protrusion with moderate left foraminal stenosis. No right foraminal stenosis. Moderate bilateral facet arthropathy. 4. There is grade 1 anterolisthesis of L5 on S1 secondary to bilateral L5 pars interarticularis defects. There is a mild broad-based disc bulge. Moderate bilateral foraminal stenosis.   Electronically Signed   By: HKathreen Devoid  On: 04/15/2015 08:29   Ct Abdomen Pelvis W Contrast  04/04/2015   CLINICAL DATA:  Restaging right upper lobe lung cancer. Chronic shortness of breath. Home oxygen. Intracranial metastatic disease.  EXAM: CT CHEST, ABDOMEN, AND PELVIS WITH CONTRAST  TECHNIQUE: Multidetector CT imaging of the chest, abdomen and pelvis was performed following the standard protocol during bolus administration of intravenous contrast.  CONTRAST:  1053mOMNIPAQUE IOHEXOL 300 MG/ML SOLN, 501mMNIPAQUE IOHEXOL 300 MG/ML SOLN  COMPARISON:  Multiple exams, including 02/10/2015  FINDINGS: CT CHEST FINDINGS  Mediastinum/Nodes:  A previous 8 mm right paratracheal lymph node is no longer present. Discontinued airway thickening and loss of fat planes along the right hilum similar to prior with postoperative findings in this vicinity. A right axillary lymph node measures 1.0 cm in short axis, formerly 1.2 cm. Clips are noted below the right latissimus dorsi muscle.  A right supraclavicular node measures 1.9 cm in short axis on image 3 series 2, formerly 1.6 cm by my measurement.  Coronary, aortic arch, and branch vessel atherosclerotic vascular disease.  Lungs/Pleura: Prior right upper lobectomy. Frothy fluid in the lower trachea and especially extending in the residual right tracheobronchial tree. Airway thickening centrally along the right tracheobronchial tree as before, with a milder degree of airway thickening distally.  There was previously a 6 by 4 mm right lower lobe pulmonary nodule laterally ; currently this measures 4 by 2 mm on image 40 series 4.  The irregular nodule posteromedially along the left lower lobe on image 36 of series 4 of the prior exam has essentially resolved with only faint residual ground-glass opacity in this vicinity. There is also nodularity in the left lung which is improved. For example, the 1.2 cm peripheral left lower lobe nodule shown previously has resolved. A 2.4 by 0.9 cm left lower lobe nodule now measures 1.8 by 0.5 cm in the posterior basal segment left lower lobe, image 49 series 4.  Underlying emphysema. No new or progressive lung nodule is identified.  Musculoskeletal: Old healed left lateral rib fractures. 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  CT ABDOMEN PELVIS FINDINGS  Hepatobiliary: Mildly contracted gallbladder.  Pancreas: Unremarkable  Spleen: Unremarkable  Adrenals/Urinary Tract: Reduced size of the right kidney upper pole mass. This mass is somewhat infiltrative and difficult to measure on today's exam. There is hypo enhancement in the right kidney upper pole as well as  some generalized asymmetry of the delayed phase enhancement on the right, specifically with less contrast blush in the renal papilla on the right compared to the left in throughout the kidney. Conceivably this may be due to some asymmetry in arterial flow given the severity of the proximal renal artery atherosclerotic calcification. However, there may also be some altered flow related to the presumed tumor in the right kidney upper pole.  Stomach/Bowel: Unremarkable  Vascular/Lymphatic: Severe aortoiliac atherosclerotic vascular disease. No pathologic adenopathy identified.  Reproductive: Unremarkable  Other: No supplemental non-categorized findings.  Musculoskeletal: Levoconvex lumbar scoliosis with rotary component. Lumbar spondylosis and degenerative disc disease resulting in right foraminal impingement at L3-4 and L5-S1, and left foraminal impingement at L4-5 and L5-S1. 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  IMPRESSION: 1. The right kidney upper pole mass and many of the scattered pulmonary nodules are generally improved in size compared to the prior exam. This would seem to indicate some interval effective therapy. 2. There is some asymmetry in delayed phase enhancement of the kidneys potentially partially attributable to the right kidney upper pole mass/process, although possibly vascular given the severity of the proximal renal artery atherosclerosis. 3. Frothy mucous in the distal trachea and the right tracheobronchial tree. Airway thickening especially on the right. 4. Emphysema. 5. Lumbar spondylosis and degenerative disc disease causing multilevel impingement. 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   Electronically Signed   By: Van Clines M.D.   On: 04/04/2015 17:14    Impression:  The patient is recovering from the effects of radiation. Latest imaging has revealed no evidence of recurring metastatic disease, as noted above.   Plan:  Repeat MRI  scan in 2 months. Patient knows to contact clinic if headaches or shaking symptoms get worse. He doesn't have any clear worsening headaches currently.   ------------------------------------------------  Jodelle Gross, MD, PhD  This document serves as a record of services personally performed by Kyung Rudd, MD. It was created on his behalf by Derek Mound, a trained medical scribe. The creation of this record is based on the scribe's personal observations and the provider's statements to them. This document has been checked and approved by the attending provider.

## 2015-04-17 ENCOUNTER — Ambulatory Visit: Payer: Self-pay | Admitting: Radiation Oncology

## 2015-04-28 ENCOUNTER — Ambulatory Visit (HOSPITAL_BASED_OUTPATIENT_CLINIC_OR_DEPARTMENT_OTHER): Payer: Medicare Other

## 2015-04-28 ENCOUNTER — Ambulatory Visit (HOSPITAL_BASED_OUTPATIENT_CLINIC_OR_DEPARTMENT_OTHER): Payer: Medicare Other | Admitting: Oncology

## 2015-04-28 ENCOUNTER — Ambulatory Visit: Payer: Medicare Other

## 2015-04-28 ENCOUNTER — Encounter: Payer: Self-pay | Admitting: Oncology

## 2015-04-28 ENCOUNTER — Other Ambulatory Visit (HOSPITAL_BASED_OUTPATIENT_CLINIC_OR_DEPARTMENT_OTHER): Payer: Medicare Other

## 2015-04-28 VITALS — BP 167/82 | HR 91

## 2015-04-28 VITALS — BP 190/81 | HR 90 | Temp 98.0°F | Resp 18 | Ht 69.5 in | Wt 176.5 lb

## 2015-04-28 DIAGNOSIS — Z79899 Other long term (current) drug therapy: Secondary | ICD-10-CM | POA: Diagnosis not present

## 2015-04-28 DIAGNOSIS — C3411 Malignant neoplasm of upper lobe, right bronchus or lung: Secondary | ICD-10-CM

## 2015-04-28 DIAGNOSIS — Z5112 Encounter for antineoplastic immunotherapy: Secondary | ICD-10-CM

## 2015-04-28 DIAGNOSIS — C7931 Secondary malignant neoplasm of brain: Secondary | ICD-10-CM

## 2015-04-28 DIAGNOSIS — C3491 Malignant neoplasm of unspecified part of right bronchus or lung: Secondary | ICD-10-CM

## 2015-04-28 DIAGNOSIS — Z5111 Encounter for antineoplastic chemotherapy: Secondary | ICD-10-CM

## 2015-04-28 DIAGNOSIS — C3490 Malignant neoplasm of unspecified part of unspecified bronchus or lung: Secondary | ICD-10-CM

## 2015-04-28 LAB — CBC WITH DIFFERENTIAL/PLATELET
BASO%: 1.5 % (ref 0.0–2.0)
Basophils Absolute: 0.1 10*3/uL (ref 0.0–0.1)
EOS ABS: 0 10*3/uL (ref 0.0–0.5)
EOS%: 0.1 % (ref 0.0–7.0)
HCT: 36.7 % — ABNORMAL LOW (ref 38.4–49.9)
HGB: 12 g/dL — ABNORMAL LOW (ref 13.0–17.1)
LYMPH%: 22.5 % (ref 14.0–49.0)
MCH: 30.4 pg (ref 27.2–33.4)
MCHC: 32.8 g/dL (ref 32.0–36.0)
MCV: 92.9 fL (ref 79.3–98.0)
MONO#: 0.7 10*3/uL (ref 0.1–0.9)
MONO%: 11.9 % (ref 0.0–14.0)
NEUT%: 64 % (ref 39.0–75.0)
NEUTROS ABS: 4 10*3/uL (ref 1.5–6.5)
PLATELETS: 445 10*3/uL — AB (ref 140–400)
RBC: 3.95 10*6/uL — AB (ref 4.20–5.82)
RDW: 15.1 % — ABNORMAL HIGH (ref 11.0–14.6)
WBC: 6.3 10*3/uL (ref 4.0–10.3)
lymph#: 1.4 10*3/uL (ref 0.9–3.3)

## 2015-04-28 LAB — COMPREHENSIVE METABOLIC PANEL (CC13)
ALBUMIN: 3.4 g/dL — AB (ref 3.5–5.0)
ALK PHOS: 89 U/L (ref 40–150)
ALT: 10 U/L (ref 0–55)
AST: 14 U/L (ref 5–34)
Anion Gap: 9 mEq/L (ref 3–11)
BUN: 11.8 mg/dL (ref 7.0–26.0)
CALCIUM: 9.6 mg/dL (ref 8.4–10.4)
CHLORIDE: 103 meq/L (ref 98–109)
CO2: 27 mEq/L (ref 22–29)
Creatinine: 0.9 mg/dL (ref 0.7–1.3)
EGFR: 83 mL/min/{1.73_m2} — AB (ref >90)
Glucose: 94 mg/dl (ref 70–140)
POTASSIUM: 4.3 meq/L (ref 3.5–5.1)
Sodium: 139 mEq/L (ref 136–145)
Total Bilirubin: 0.32 mg/dL (ref 0.20–1.20)
Total Protein: 7.3 g/dL (ref 6.4–8.3)

## 2015-04-28 LAB — TSH CHCC: TSH: 2.29 m[IU]/L (ref 0.320–4.118)

## 2015-04-28 LAB — UA PROTEIN, DIPSTICK - CHCC: Protein, ur: NEGATIVE mg/dL

## 2015-04-28 MED ORDER — SODIUM CHLORIDE 0.9 % IV SOLN
Freq: Once | INTRAVENOUS | Status: AC
  Administered 2015-04-28: 10:00:00 via INTRAVENOUS

## 2015-04-28 MED ORDER — ACETAMINOPHEN 325 MG PO TABS
ORAL_TABLET | ORAL | Status: AC
  Filled 2015-04-28: qty 2

## 2015-04-28 MED ORDER — SODIUM CHLORIDE 0.9 % IV SOLN
10.0000 mg/kg | Freq: Once | INTRAVENOUS | Status: AC
  Administered 2015-04-28: 800 mg via INTRAVENOUS
  Filled 2015-04-28: qty 50

## 2015-04-28 MED ORDER — SODIUM CHLORIDE 0.9 % IV SOLN
Freq: Once | INTRAVENOUS | Status: AC
  Administered 2015-04-28: 12:00:00 via INTRAVENOUS
  Filled 2015-04-28: qty 4

## 2015-04-28 MED ORDER — DIPHENHYDRAMINE HCL 50 MG/ML IJ SOLN
INTRAMUSCULAR | Status: AC
  Filled 2015-04-28: qty 1

## 2015-04-28 MED ORDER — ACETAMINOPHEN 325 MG PO TABS
650.0000 mg | ORAL_TABLET | Freq: Once | ORAL | Status: AC
  Administered 2015-04-28: 650 mg via ORAL

## 2015-04-28 MED ORDER — DOCETAXEL CHEMO INJECTION 160 MG/16ML
75.0000 mg/m2 | Freq: Once | INTRAVENOUS | Status: AC
  Administered 2015-04-28: 150 mg via INTRAVENOUS
  Filled 2015-04-28: qty 15

## 2015-04-28 MED ORDER — DIPHENHYDRAMINE HCL 50 MG/ML IJ SOLN
50.0000 mg | Freq: Once | INTRAMUSCULAR | Status: AC
  Administered 2015-04-28: 50 mg via INTRAVENOUS

## 2015-04-28 NOTE — Patient Instructions (Signed)
[facility] Discharge Instructions for Patients Receiving Chemotherapy  Today you received the following chemotherapy agents cyramza, taxotere  To help prevent nausea and vomiting after your treatment, we encourage you to take your nausea medication   If you develop nausea and vomiting that is not controlled by your nausea medication, call the clinic.   BELOW ARE SYMPTOMS THAT SHOULD BE REPORTED IMMEDIATELY:  *FEVER GREATER THAN 100.5 F  *CHILLS WITH OR WITHOUT FEVER  NAUSEA AND VOMITING THAT IS NOT CONTROLLED WITH YOUR NAUSEA MEDICATION  *UNUSUAL SHORTNESS OF BREATH  *UNUSUAL BRUISING OR BLEEDING  TENDERNESS IN MOUTH AND THROAT WITH OR WITHOUT PRESENCE OF ULCERS  *URINARY PROBLEMS  *BOWEL PROBLEMS  UNUSUAL RASH Items with * indicate a potential emergency and should be followed up as soon as possible.  Feel free to call the clinic you have any questions or concerns. The clinic phone number is [phone].  Please show the [facility] at check-in to the Emergency Department and triage nurse.

## 2015-04-28 NOTE — Progress Notes (Signed)
[place] [phone]) [phone]   [phone]) [phone]  OFFICE PROGRESS NOTE  [name], PA-C [place] [address]  DIAGNOSIS: Metastatic non-small cell lung cancer, squamous cell carcinoma initially diagnosed as Stage IIA (T2b., N0, M0) non-small cell lung cancer, squamous cell carcinoma diagnosed in December of 2013   PRIOR THERAPY:  1) Status post right upper lobectomy under the care of Dr. Prescott Gum on 09/14/2012.  2) Adjuvant chemotherapy with cisplatin 75 mg/M2 and docetaxel 75 mg/M2 with Neulasta support every 3 weeks, status post 1 cycle. Starting with cycle #2, patient will receive carboplatin for an AUC initially of 4.5 and paclitaxel at 175 mg per meter squared with Neulasta support given every 3 weeks. Status post a total of 3 cycles.  3) Systemic chemotherapy with carboplatin for AUC of 5 on day 1 and gemcitabine 1000 MG/M2 on days 1 and 8 every 3 weeks. First dose 09/11/2014. Status post 4 cycles. Discontinued secondary to intolerance 4) Immunotherapy with Nivolumab 3 MG/KG every 2 weeks. Status post 4 cycles. 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  CURRENT THERAPY: Docetaxel 75 mg/m2 and Cyramza 10 mg/kg given every 3 weeks. First cycle started on 04/07/15. He is here for cycle 2 of his chemo today.  CHEMOTHERAPY INTENT: Palliative. CURRENT # OF CHEMOTHERAPY CYCLES: 1 CURRENT ANTIEMETICS: Zofran, dexamethasone and Compazine  CURRENT SMOKING STATUS: Quit smoking recently.  ORAL CHEMOTHERAPY AND CONSENT: None  CURRENT BISPHOSPHONATES USE: None  PAIN MANAGEMENT: 3/10 controlled by Norco when necessary  NARCOTICS INDUCED CONSTIPATION: Milk of magnesia on as-needed basis.  LIVING WILL AND CODE STATUS: No CODE BLUE.   INTERVAL HISTORY:  [name] 74 y.o. male returns to the clinic today for followup visit accompanied his wife. Received his first dose of Docetaxel and Cyramza 3  weeks ago. Having more fatigue following chmeotherapy.Has had occasional vomiting after eating. Not using antiemetics. He has his baseline shortness of breath. He is currently on home oxygen. He denied having any significant chest pain but has mild cough and no hemoptysis. Denies fever or chills, no weight loss or night sweats. Has been working with PCP to BP under better control. Recently started on Lisinopril and amlodipine was reduced. Has stopped taking lasix on his own. Back pain is better and takes pain meds intermittently. He is here for evaluation prior to cycle 2 of his chemo.   MEDICAL HISTORY: Past Medical History  Diagnosis Date  . Arthritis   . Wheezing   . Sore throat   . Carotid artery occlusion   . COPD (chronic obstructive pulmonary disease) ([place])   . Lumbar disc disease   . Restless leg syndrome   . Allergic rhinitis   . Hypertension   . Anxiety     anxiety  . Shortness of breath   . Lung mass   . GERD (gastroesophageal reflux disease)   . H/O hiatal hernia   . Pre-operative cardiovascular examination   . Nonspecific abnormal unspecified cardiovascular function study   . Peripheral vascular disease, unspecified ([place])   . Pneumonia 2014    ?   Marland Kitchen On home oxygen therapy     prn  . Constipation   . Cancer ([place]) 09/14/12    invasive mod diff squamous cell ca  . 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 S/P radiation therapy 12/04/14 srs    lt frontal brain    ALLERGIES:  has No Known Allergies.  MEDICATIONS:  Current Outpatient Prescriptions  Medication Sig Dispense Refill  . albuterol (PROVENTIL) (2.5 MG/3ML) 0.083% nebulizer solution Inhale 2.5 mg into the lungs every 6 (six) hours as needed.    Marland Kitchen amLODipine (NORVASC) 5 MG tablet Take 5 mg by mouth daily.    Marland Kitchen aspirin EC 81 MG tablet Take 81 mg by mouth daily.     . clonazePAM (KLONOPIN) 0.5 MG tablet Take 0.5 mg by mouth 2 (two) times daily as needed for anxiety. Takes one tablet by mouth two times  daily as needed for anxiety.    . furosemide (LASIX) 20 MG tablet Take 20 mg by mouth daily.    Marland Kitchen HYDROcodone-acetaminophen (NORCO) 7.5-325 MG tablet Take 1 tablet by mouth every 6 (six) hours as needed for moderate pain. 45 tablet 0  . ipratropium-albuterol (DUONEB) 0.5-2.5 (3) MG/3ML SOLN Take 3 mLs by nebulization 4 (four) times daily. (Patient taking differently: Take 3 mLs by nebulization 4 (four) times daily. Pt takes nebulizer 2 to 3 times daily.) 360 mL 2  . isosorbide mononitrate (IMDUR) 30 MG 24 hr tablet Take 1 tablet (30 mg total) by mouth daily. 30 tablet 3  . lisinopril (PRINIVIL,ZESTRIL) 40 MG tablet Take 40 mg by mouth daily.    Marland Kitchen LORazepam (ATIVAN) 0.5 MG tablet Take 1 tablet by mouth or sublingually every 8 hours as needed for nausea. May take 1 tablet by mouth at bedtime. 40 tablet 0  . metoprolol tartrate (LOPRESSOR) 25 MG tablet Take 25 mg by mouth 2 (two) times daily.     Marland Kitchen omeprazole (PRILOSEC) 40 MG capsule Take 40 mg by mouth daily.    . potassium chloride SA (K-DUR,KLOR-CON) 20 MEQ tablet Take 1 tablet (20 mEq total) by mouth daily. 30 tablet 1  . potassium chloride SA (K-DUR,KLOR-CON) 20 MEQ tablet Take 1 tablet (20 mEq total) by mouth daily. 7 tablet 0  . pramipexole (MIRAPEX) 1 MG tablet Take 1 mg by mouth at bedtime.    . traMADol (ULTRAM) 50 MG tablet Take 1 tablet (50 mg total) by mouth every 12 (twelve) hours as needed. 30 tablet 0  . zolpidem (AMBIEN) 10 MG tablet Take 10 mg by mouth at bedtime as needed for sleep.     No current facility-administered medications for this visit.   Facility-Administered Medications Ordered in Other Visits  Medication Dose Route Frequency Provider Last Rate Last Dose  . DOCEtaxel (TAXOTERE) 150 mg in dextrose 5 % 250 mL chemo infusion  75 mg/m2 (Treatment Plan Actual) Intravenous Once Curt Bears, MD      . ondansetron (ZOFRAN) 8 mg, dexamethasone (DECADRON) 10 mg in sodium chloride 0.9 % 50 mL IVPB   Intravenous Once Curt Bears, MD      . ramucirumab [facility]) 800 mg in sodium chloride 0.9 % 170 mL chemo infusion  10 mg/kg (Treatment Plan Actual) Intravenous Once Curt Bears, MD      . sodium chloride 0.9 % injection 10 mL  10 mL Intracatheter PRN Curt Bears, MD   10 mL at 01/29/15 0944    SURGICAL HISTORY:  Past Surgical History  Procedure Laterality Date  . Carotid endarterectomy  10/15/2010    left  . Hand surgery      repair of the left long, ring, and small finger after a saw injury  . Video bronchoscopy  07/24/2012    Procedure: VIDEO BRONCHOSCOPY WITH FLUORO;  Surgeon: Kathee Delton, MD;  Location: [facility];  Service:  Cardiopulmonary;  Laterality: Bilateral;  . Lobectomy Right 09/14/2012    Procedure: LOBECTOMY;  Surgeon: Ivin Poot, MD;  Location: [facility];  Service: Thoracic;  Laterality: Right;  RIGHT UPPER LOBECTOMY  . Video assisted thoracoscopy Right 09/14/2012    Procedure: VIDEO ASSISTED THORACOSCOPY;  Surgeon: Ivin Poot, MD;  Location: [place];  Service: Thoracic;  Laterality: Right;  . Video bronchoscopy with endobronchial ultrasound N/A 08/27/2014    Procedure: VIDEO BRONCHOSCOPY WITH ENDOBRONCHIAL ULTRASOUND;  Surgeon: Ivin Poot, MD;  Location: [facility];  Service: Thoracic;  Laterality: N/A;  . [place] node biopsy Right 08/27/2014    Procedure: BIOPSY SCALENE NODE;  Surgeon: Ivin Poot, MD;  Location: [facility];  Service: Thoracic;  Laterality: Right;  . Sterotactic radiosurgery Right 09/25/14    right parietal 30Gy/1 fx    REVIEW OF SYSTEMS:  Constitutional: positive for fatigue Eyes: negative Ears, nose, mouth, throat, and face: negative Respiratory: positive for cough and dyspnea on exertion Cardiovascular: negative Gastrointestinal: negative Genitourinary:negative Integument/breast: negative Hematologic/lymphatic: negative Musculoskeletal:negative Neurological: negative Behavioral/Psych: negative Endocrine: negative Allergic/Immunologic: negative    PHYSICAL EXAMINATION: General appearance: alert, cooperative and no distress Head: Normocephalic, without obvious abnormality, atraumatic Neck: no JVD, supple, symmetrical, trachea midline and thyroid not enlarged, symmetric, no tenderness/mass/nodules Lymph nodes: Supraclavicular adenopathy: on the right. Approximately 2 cm in diameter. Resp: wheezes bilaterally Back: symmetric, no curvature. ROM normal. No CVA tenderness. Cardio: regular rate and rhythm, S1, S2 normal, no murmur, click, rub or gallop GI: soft, non-tender; bowel sounds normal; no masses,  no organomegaly Extremities: extremities normal, atraumatic, no cyanosis or edema Neurologic: Alert and oriented X 3, normal strength and tone. Normal symmetric reflexes. Normal coordination and gait  ECOG PERFORMANCE STATUS: 1 - Symptomatic but completely ambulatory  Blood pressure 190/81, pulse 90, temperature 98 F (36.7 C), temperature source Oral, resp. rate 18, height 5' 9.5" (1.765 m), weight 176 lb 8 oz (80.06 kg), SpO2 99 %.  LABORATORY DATA: Lab Results  Component Value Date   WBC 6.3 04/28/2015   HGB 12.0* 04/28/2015   HCT 36.7* 04/28/2015   MCV 92.9 04/28/2015   PLT 445* 04/28/2015      Chemistry      Component Value Date/Time   NA 139 04/28/2015 0839   NA 138 12/05/2014 0618   K 4.3 04/28/2015 0839   K 3.4* 12/05/2014 0618   CL 95* 12/05/2014 0618   CL 101 12/28/2012 0852   CO2 27 04/28/2015 0839   CO2 32 12/05/2014 0618   BUN 11.8 04/28/2015 0839   BUN 9 12/05/2014 0618   CREATININE 0.9 04/28/2015 0839   CREATININE 0.52* 12/05/2014 0618      Component Value Date/Time   CALCIUM 9.6 04/28/2015 0839   CALCIUM 9.2 12/05/2014 0618   ALKPHOS 89 04/28/2015 0839   ALKPHOS 71 12/05/2014 0618   AST 14 04/28/2015 0839   AST 24 12/05/2014 0618   ALT 10 04/28/2015 0839   ALT 30 12/05/2014 0618   BILITOT 0.32 04/28/2015 0839   BILITOT 0.2* 12/05/2014 0618       RADIOGRAPHIC STUDIES: Ct Chest W  Contrast  04/04/2015  CLINICAL DATA:  Restaging right upper lobe lung cancer. Chronic shortness of breath. Home oxygen. Intracranial metastatic disease. EXAM: CT CHEST, ABDOMEN, AND PELVIS WITH CONTRAST TECHNIQUE: Multidetector CT imaging of the chest, abdomen and pelvis was performed following the standard protocol during bolus administration of intravenous contrast. CONTRAST:  127m OMNIPAQUE IOHEXOL 300 MG/ML SOLN, 552mOMNIPAQUE IOHEXOL 300 MG/ML  SOLN COMPARISON:  Multiple exams, including 02/10/2015 FINDINGS: CT CHEST FINDINGS Mediastinum/Nodes: A previous 8 mm right paratracheal lymph node is no longer present. Discontinued airway thickening and loss of fat planes along the right hilum similar to prior with postoperative findings in this vicinity. A right axillary lymph node measures 1.0 cm in short axis, formerly 1.2 cm. Clips are noted below the right latissimus dorsi muscle. A right supraclavicular node measures 1.9 cm in short axis on image 3 series 2, formerly 1.6 cm by my measurement. Coronary, aortic arch, and branch vessel atherosclerotic vascular disease. Lungs/Pleura: Prior right upper lobectomy. Frothy fluid in the lower trachea and especially extending in the residual right tracheobronchial tree. Airway thickening centrally along the right tracheobronchial tree as before, with a milder degree of airway thickening distally. There was previously a 6 by 4 mm right lower lobe pulmonary nodule laterally ; currently this measures 4 by 2 mm on image 40 series 4. The irregular nodule posteromedially along the left lower lobe on image 36 of series 4 of the prior exam has essentially resolved with only faint residual ground-glass opacity in this vicinity. There is also nodularity in the left lung which is improved. For example, the 1.2 cm peripheral left lower lobe nodule shown previously has resolved. A 2.4 by 0.9 cm left lower lobe nodule now measures 1.8 by 0.5 cm in the posterior basal segment left  lower lobe, image 49 series 4. Underlying emphysema. No new or progressive lung nodule is identified. Musculoskeletal: Old healed left lateral rib fractures. 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 CT ABDOMEN PELVIS FINDINGS Hepatobiliary: Mildly contracted gallbladder. Pancreas: Unremarkable Spleen: Unremarkable Adrenals/Urinary Tract: Reduced size of the right kidney upper pole mass. This mass is somewhat infiltrative and difficult to measure on today's exam. There is hypo enhancement in the right kidney upper pole as well as some generalized asymmetry of the delayed phase enhancement on the right, specifically with less contrast blush in the renal papilla on the right compared to the left in throughout the kidney. Conceivably this may be due to some asymmetry in arterial flow given the severity of the proximal renal artery atherosclerotic calcification. However, there may also be some altered flow related to the presumed tumor in the right kidney upper pole. Stomach/Bowel: Unremarkable Vascular/Lymphatic: Severe aortoiliac atherosclerotic vascular disease. No pathologic adenopathy identified. Reproductive: Unremarkable Other: No supplemental non-categorized findings. Musculoskeletal: Levoconvex lumbar scoliosis with rotary component. Lumbar spondylosis and degenerative disc disease resulting in right foraminal impingement at L3-4 and L5-S1, and left foraminal impingement at L4-5 and L5-S1. 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 IMPRESSION: 1. The right kidney upper pole mass and many of the scattered pulmonary nodules are generally improved in size compared to the prior exam. This would seem to indicate some interval effective therapy. 2. There is some asymmetry in delayed phase enhancement of the kidneys potentially partially attributable to the right kidney upper pole mass/process, although possibly vascular given the severity of the proximal  renal artery atherosclerosis. 3. Frothy mucous in the distal trachea and the right tracheobronchial tree. Airway thickening especially on the right. 4. Emphysema. 5. Lumbar spondylosis and degenerative disc disease causing multilevel impingement. 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 Electronically Signed   By: Van Clines M.D.   On: 04/04/2015 17:14   Mr Thoracic Spine W Wo Contrast  04/15/2015  CLINICAL DATA:  Severe back pain.  History of lung cancer. EXAM: MRI THORACIC  SPINE WITHOUT AND WITH CONTRAST TECHNIQUE: Multiplanar and multiecho pulse sequences of the thoracic spine were obtained without and with intravenous contrast. CONTRAST:  71m MULTIHANCE GADOBENATE DIMEGLUMINE 529 MG/ML IV SOLN COMPARISON:  None. FINDINGS: There is a mild chronic anterior T3 vertebral body compression fracture without marrow signal abnormality. The alignment is anatomic. There is no acute fracture or static listhesis. The marrow signal is normal. The thoracic spinal cord is normal in size and signal. The disc spaces are maintained. Small right pleural effusion. Enlarged right infraclavicular lymph node partially visualized measuring approximately 19 mm in short axis. T1-T2: No significant disc bulge, foraminal stenosis or central canal stenosis. T2-T3: Mild broad-based disc bulge. No foraminal or central canal stenosis. T3-T4: No significant disc bulge, foraminal stenosis or central canal stenosis. T4-T5: No significant disc bulge, foraminal stenosis or central canal stenosis. T5-T6: Small central disc protrusion. No foraminal or central canal stenosis. T6-T7: Small central disc protrusion impressing on the ventral thecal sac. No foraminal or central canal stenosis. T7-T8: Tiny central disc protrusion. No foraminal or central canal stenosis. T8-T9: Shallow right paracentral disc protrusion. No foraminal or central canal stenosis. T9-T10: No significant disc bulge, foraminal stenosis or central canal stenosis. T10-T11: No  significant disc bulge, foraminal stenosis or central canal stenosis. T11-T12: No significant disc bulge, foraminal stenosis or central canal stenosis. IMPRESSION: 1. No evidence of metastatic disease of the lumbar spine. 2. Thoracic spine spondylosis as described above. 3. Small right pleural effusion. Enlarged right infraclavicular lymph node partially visualized measuring approximately 19 mm in short axis. Electronically Signed   By: HKathreen Devoid  On: 04/15/2015 08:22   Mr Lumbar Spine W Wo Contrast  04/15/2015  CLINICAL DATA:  Low back pain, history of lung cancer EXAM: MRI LUMBAR SPINE WITHOUT AND WITH CONTRAST TECHNIQUE: Multiplanar and multiecho pulse sequences of the lumbar spine were obtained without and with intravenous contrast. CONTRAST:  169mMULTIHANCE GADOBENATE DIMEGLUMINE 529 MG/ML IV SOLN COMPARISON:  None. FINDINGS: The vertebral bodies of the lumbar spine are normal in size. There is grade 1 anterolisthesis of L5 on S1 secondary to bilateral L5 pars interarticularis defects. There is normal bone marrow signal demonstrated throughout the vertebra. There is degenerative disc disease with disc height loss at L2-3, L3-4 L4-5 and L5-S1. The spinal cord is normal in signal and contour. The cord terminates normally at T12 . The nerve roots of the cauda equina and the filum terminale are normal. The visualized portions of the SI joints are unremarkable. The imaged intra-abdominal contents are unremarkable. T12-L1: No significant disc bulge. No evidence of neural foraminal stenosis. No central canal stenosis. L1-L2: Mild broad-based disc bulge. No evidence of neural foraminal stenosis. No central canal stenosis. L2-L3: Mild broad-based disc osteophyte complex. Mild bilateral facet arthropathy. Mild spinal stenosis. No evidence of neural foraminal stenosis. L3-L4: Mild broad-based disc bulge eccentric towards the right. Mild bilateral facet arthropathy. Moderate right foraminal stenosis. No left  foraminal stenosis. Mild spinal stenosis. L4-L5: Mild broad-based disc bulge with a left foraminal/lateral disc protrusion with moderate left foraminal stenosis. No right foraminal stenosis. Moderate bilateral facet arthropathy. No central canal stenosis. L5-S1: Grade 1 anterolisthesis with a mild broad-based disc bulge. Moderate bilateral foraminal stenosis. No central canal stenosis. IMPRESSION: 1. No evidence of metastatic disease of the lumbar spine. 2. At L3-4 there is a mild broad-based disc bulge eccentric towards the right. Mild bilateral facet arthropathy. Moderate right foraminal stenosis. No left foraminal stenosis. Mild spinal stenosis. 3. At L4-5 there  is a mild broad-based disc bulge with a left foraminal/lateral disc protrusion with moderate left foraminal stenosis. No right foraminal stenosis. Moderate bilateral facet arthropathy. 4. There is grade 1 anterolisthesis of L5 on S1 secondary to bilateral L5 pars interarticularis defects. There is a mild broad-based disc bulge. Moderate bilateral foraminal stenosis. Electronically Signed   By: Kathreen Devoid   On: 04/15/2015 08:29   Ct Abdomen Pelvis W Contrast  04/04/2015  CLINICAL DATA:  Restaging right upper lobe lung cancer. Chronic shortness of breath. Home oxygen. Intracranial metastatic disease. EXAM: CT CHEST, ABDOMEN, AND PELVIS WITH CONTRAST TECHNIQUE: Multidetector CT imaging of the chest, abdomen and pelvis was performed following the standard protocol during bolus administration of intravenous contrast. CONTRAST:  166m OMNIPAQUE IOHEXOL 300 MG/ML SOLN, 575mOMNIPAQUE IOHEXOL 300 MG/ML SOLN COMPARISON:  Multiple exams, including 02/10/2015 FINDINGS: CT CHEST FINDINGS Mediastinum/Nodes: A previous 8 mm right paratracheal lymph node is no longer present. Discontinued airway thickening and loss of fat planes along the right hilum similar to prior with postoperative findings in this vicinity. A right axillary lymph node measures 1.0 cm in short  axis, formerly 1.2 cm. Clips are noted below the right latissimus dorsi muscle. A right supraclavicular node measures 1.9 cm in short axis on image 3 series 2, formerly 1.6 cm by my measurement. Coronary, aortic arch, and branch vessel atherosclerotic vascular disease. Lungs/Pleura: Prior right upper lobectomy. Frothy fluid in the lower trachea and especially extending in the residual right tracheobronchial tree. Airway thickening centrally along the right tracheobronchial tree as before, with a milder degree of airway thickening distally. There was previously a 6 by 4 mm right lower lobe pulmonary nodule laterally ; currently this measures 4 by 2 mm on image 40 series 4. The irregular nodule posteromedially along the left lower lobe on image 36 of series 4 of the prior exam has essentially resolved with only faint residual ground-glass opacity in this vicinity. There is also nodularity in the left lung which is improved. For example, the 1.2 cm peripheral left lower lobe nodule shown previously has resolved. A 2.4 by 0.9 cm left lower lobe nodule now measures 1.8 by 0.5 cm in the posterior basal segment left lower lobe, image 49 series 4. Underlying emphysema. No new or progressive lung nodule is identified. Musculoskeletal: Old healed left lateral rib fractures. 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 CT ABDOMEN PELVIS FINDINGS Hepatobiliary: Mildly contracted gallbladder. Pancreas: Unremarkable Spleen: Unremarkable Adrenals/Urinary Tract: Reduced size of the right kidney upper pole mass. This mass is somewhat infiltrative and difficult to measure on today's exam. There is hypo enhancement in the right kidney upper pole as well as some generalized asymmetry of the delayed phase enhancement on the right, specifically with less contrast blush in the renal papilla on the right compared to the left in throughout the kidney. Conceivably this may be due to some asymmetry in arterial flow given the  severity of the proximal renal artery atherosclerotic calcification. However, there may also be some altered flow related to the presumed tumor in the right kidney upper pole. Stomach/Bowel: Unremarkable Vascular/Lymphatic: Severe aortoiliac atherosclerotic vascular disease. No pathologic adenopathy identified. Reproductive: Unremarkable Other: No supplemental non-categorized findings. Musculoskeletal: Levoconvex lumbar scoliosis with rotary component. Lumbar spondylosis and degenerative disc disease resulting in right foraminal impingement at L3-4 and L5-S1, and left foraminal impingement at L4-5 and L5-S1. 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 IMPRESSION: 1. The right kidney upper  pole mass and many of the scattered pulmonary nodules are generally improved in size compared to the prior exam. This would seem to indicate some interval effective therapy. 2. There is some asymmetry in delayed phase enhancement of the kidneys potentially partially attributable to the right kidney upper pole mass/process, although possibly vascular given the severity of the proximal renal artery atherosclerosis. 3. Frothy mucous in the distal trachea and the right tracheobronchial tree. Airway thickening especially on the right. 4. Emphysema. 5. Lumbar spondylosis and degenerative disc disease causing multilevel impingement. 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 Electronically Signed   By: Van Clines M.D.   On: 04/04/2015 17:14    ASSESSMENT AND PLAN: This is a very pleasant 74 year old white male with history of stage IIA non-small cell lung cancer status post right upper lobectomy followed by 4 cycles of adjuvant chemotherapy.  The patient underwent systemic chemotherapy with carboplatin and gemcitabine status post 4 cycles discontinued secondary to intolerance and mild disease progression.  The patient completed treatment with Nivolumab status post 4 cycles but this was  discontinued secondary to disease progression. He also recently completed stereotactic radiotherapy to metastatic brain lesions.  Patient seen with [name] today. Recommend that he proceed with cycle 2 of his docetaxel and Cyramza today as scheduled. Recommended that the patient exercise about 30 minutes per day or as tolerated for deconditioning.   He will return for follow-up visit in 3 weeks for reevaluation for cycle 3 of his chemo. He was advised to call immediately if he has any concerning symptoms in the interval.  The patient voices understanding of current disease status and treatment options and is in agreement with the current care plan.  All questions were answered. The patient knows to call the clinic with any problems, questions or concerns. We can certainly see the patient much sooner if necessary.  Mikey Bussing, DNP, AGPCNP-BC, AOCNP  ADDENDUM: Hematology/Oncology Attending: I had a face to face encounter with the patient today. I recommended his care plan. This is a very pleasant 74 years old white male with metastatic non-small cell lung cancer currently undergoing systemic chemotherapy with docetaxel and surrounds status post 1 cycle. He tolerated the first cycle of his treatment well except for increasing fatigue and aching pain after the Neulasta injection. I recommended for the patient to proceed with cycle #2 today as scheduled. For the fatigue and deconditioning, I strongly encouraged the patient to exercise slowly at daily basis. He would come back for follow-up visit in 3 weeks for reevaluation before starting cycle #3. The patient was advised to call immediately if he has any concerning symptoms in the interval.  Disclaimer: This note was dictated with voice recognition software. Similar sounding words can inadvertently be transcribed and may be missed upon review. Eilleen Kempf., MD 04/28/2015

## 2015-04-28 NOTE — Progress Notes (Signed)
Per Julien Nordmann it is okay to treat pt today with BP 167/82.

## 2015-04-29 ENCOUNTER — Ambulatory Visit (HOSPITAL_BASED_OUTPATIENT_CLINIC_OR_DEPARTMENT_OTHER): Payer: Medicare Other

## 2015-04-29 VITALS — BP 154/72 | HR 103 | Temp 98.7°F

## 2015-04-29 DIAGNOSIS — Z5189 Encounter for other specified aftercare: Secondary | ICD-10-CM | POA: Diagnosis not present

## 2015-04-29 DIAGNOSIS — C3491 Malignant neoplasm of unspecified part of right bronchus or lung: Secondary | ICD-10-CM

## 2015-04-29 DIAGNOSIS — C3411 Malignant neoplasm of upper lobe, right bronchus or lung: Secondary | ICD-10-CM

## 2015-04-29 MED ORDER — PEGFILGRASTIM INJECTION 6 MG/0.6ML ~~LOC~~
6.0000 mg | PREFILLED_SYRINGE | Freq: Once | SUBCUTANEOUS | Status: AC
  Administered 2015-04-29: 6 mg via SUBCUTANEOUS
  Filled 2015-04-29: qty 0.6

## 2015-05-01 ENCOUNTER — Ambulatory Visit: Payer: Medicare Other

## 2015-05-01 ENCOUNTER — Other Ambulatory Visit: Payer: Medicare Other

## 2015-05-01 ENCOUNTER — Ambulatory Visit: Payer: Medicare Other | Admitting: Internal Medicine

## 2015-05-02 ENCOUNTER — Telehealth: Payer: Self-pay | Admitting: *Deleted

## 2015-05-02 NOTE — Telephone Encounter (Signed)
Returned call  To patient wife, spoke with both wife and husband, about no radiation done to his shoulder just his brain by Dr. Lisbeth Renshaw, per wife [name] thought we did radiation to his shoulder neck area"There's a knot there and getting bigger and getting harder to swallow,  Spoke with Dr. Lisbeth Renshaw and has scheduled a follow up this Wednesday at 1000am,transferred call to Earleen Newport to give informatin of appt to wife and husband 8:44 AM

## 2015-05-07 ENCOUNTER — Telehealth: Payer: Self-pay | Admitting: *Deleted

## 2015-05-07 ENCOUNTER — Ambulatory Visit
Admission: RE | Admit: 2015-05-07 | Discharge: 2015-05-07 | Disposition: A | Discharge status: Home / Self Care | Payer: Medicare Other | Source: Ambulatory Visit | Attending: Radiation Oncology | Admitting: Radiation Oncology

## 2015-05-07 ENCOUNTER — Encounter: Payer: Self-pay | Admitting: Radiation Oncology

## 2015-05-07 ENCOUNTER — Ambulatory Visit (HOSPITAL_BASED_OUTPATIENT_CLINIC_OR_DEPARTMENT_OTHER): Payer: Medicare Other | Admitting: Nurse Practitioner

## 2015-05-07 VITALS — BP 176/83 | HR 99 | Temp 97.8°F | Resp 20

## 2015-05-07 VITALS — BP 186/90 | HR 102 | Temp 97.0°F | Resp 20 | Ht 69.5 in | Wt 180.5 lb

## 2015-05-07 DIAGNOSIS — C3490 Malignant neoplasm of unspecified part of unspecified bronchus or lung: Secondary | ICD-10-CM

## 2015-05-07 DIAGNOSIS — I10 Essential (primary) hypertension: Secondary | ICD-10-CM | POA: Diagnosis not present

## 2015-05-07 DIAGNOSIS — Z510 Encounter for antineoplastic radiation therapy: Secondary | ICD-10-CM | POA: Insufficient documentation

## 2015-05-07 DIAGNOSIS — C7931 Secondary malignant neoplasm of brain: Secondary | ICD-10-CM | POA: Insufficient documentation

## 2015-05-07 DIAGNOSIS — C3411 Malignant neoplasm of upper lobe, right bronchus or lung: Secondary | ICD-10-CM

## 2015-05-07 DIAGNOSIS — T8089XA Other complications following infusion, transfusion and therapeutic injection, initial encounter: Secondary | ICD-10-CM

## 2015-05-07 NOTE — Progress Notes (Signed)
Follow up  [place] brain tx 03/12/15,  No new vision changes, feels funny behind neck at times, c/o knot on right neck, pushing against throat stated,"Makes it froggy", has been in the bed 3 days, very weak fatiguedm anorexia, drinks mtn dew,cola, oj ,water, coughing up yellowish/brown phelgm, has rash on lrft forearm, took ortho static vitals, concerned about knot on right neck asking if xray or rad tx needed,  10:12 AM BP 186/90 mmHg  Pulse 102  Temp(Src) 97 F (36.1 C) (Oral)  Resp 20  Ht 5' 9.5" (1.765 m)  Wt 180 lb 8 oz (81.874 kg)  BMI 26.28 kg/m2  SpO2 100% Standing vitals Sitting b/p=189/78,P=99, RR=20  Wt Readings from Last 3 Encounters:  05/07/15 180 lb 8 oz (81.874 kg)  04/28/15 176 lb 8 oz (80.06 kg)  04/02/15 177 lb 1.6 oz (80.332 kg)

## 2015-05-07 NOTE — Telephone Encounter (Signed)
Patient flagged writer down to ask to be seen after his radiology appt with Dr. Lisbeth Renshaw today. He has a severely red raised rash on his left forearm and it has spread to his right arm. The rash started Sunday. No new products noted or changes in diet.

## 2015-05-07 NOTE — Progress Notes (Signed)
Radiation Oncology         [phone] ________________________________  Name: [name] MRN: [id number]  Date: 05/07/2015  DOB: Mar 14, 1941  Follow-Up Visit Note  CC: [name], MD  Diagnosis:   Metastatic lung cancer with brain metastasis   Narrative:  The patient returns today for routine follow-up, complaining of a right supraclavicular lymph node which has enlarged on recent imaging. He states that he does feel some pressure in this area.  Follow up  [place] brain tx 03/12/15,  No new vision changes, feels funny behind neck at times, c/o knot on right neck, pushing against throat stated,"Makes it froggy", has been in the bed 3 days, very weak fatiguedm anorexia, drinks mtn dew,cola, oj ,water, coughing up yellowish/brown phelgm, has rash on lrft forearm, took ortho static vitals, concerned about knot on right neck asking if xray or rad tx needed,  7:53 PM BP 186/90 mmHg  Pulse 102  Temp(Src) 97 F (36.1 C) (Oral)  Resp 20  Ht 5' 9.5" (1.765 m)  Wt 180 lb 8 oz (81.874 kg)  BMI 26.28 kg/m2  SpO2 100% Standing vitals Sitting b/p=189/78,P=99, RR=20  Wt Readings from Last 3 Encounters:  05/07/15 180 lb 8 oz (81.874 kg)  04/28/15 176 lb 8 oz (80.06 kg)  04/02/15 177 lb 1.6 oz (80.332 kg)                                ALLERGIES:  has No Known Allergies.  Meds: Current Outpatient Prescriptions  Medication Sig Dispense Refill  . albuterol (PROVENTIL) (2.5 MG/3ML) 0.083% nebulizer solution Inhale 2.5 mg into the lungs every 6 (six) hours as needed.    Marland Kitchen amLODipine (NORVASC) 5 MG tablet Take 5 mg by mouth daily.    Marland Kitchen aspirin EC 81 MG tablet Take 81 mg by mouth daily.     . clonazePAM (KLONOPIN) 0.5 MG tablet Take 0.5 mg by mouth 2 (two) times daily as needed for anxiety. Takes one tablet by mouth two times daily as needed for anxiety.    Marland Kitchen HYDROcodone-acetaminophen (NORCO) 7.5-325 MG tablet Take 1 tablet by mouth every 6 (six) hours as needed for  moderate pain. 45 tablet 0  . ipratropium-albuterol (DUONEB) 0.5-2.5 (3) MG/3ML SOLN Take 3 mLs by nebulization 4 (four) times daily. (Patient taking differently: Take 3 mLs by nebulization 4 (four) times daily. Pt takes nebulizer 2 to 3 times daily.) 360 mL 2  . isosorbide mononitrate (IMDUR) 30 MG 24 hr tablet Take 1 tablet (30 mg total) by mouth daily. 30 tablet 3  . lisinopril (PRINIVIL,ZESTRIL) 40 MG tablet Take 40 mg by mouth daily.    Marland Kitchen LORazepam (ATIVAN) 0.5 MG tablet Take 1 tablet by mouth or sublingually every 8 hours as needed for nausea. May take 1 tablet by mouth at bedtime. 40 tablet 0  . metoprolol tartrate (LOPRESSOR) 25 MG tablet Take 25 mg by mouth 2 (two) times daily.     Marland Kitchen omeprazole (PRILOSEC) 40 MG capsule Take 40 mg by mouth daily.    . potassium chloride SA (K-DUR,KLOR-CON) 20 MEQ tablet Take 1 tablet (20 mEq total) by mouth daily. 30 tablet 1  . potassium chloride SA (K-DUR,KLOR-CON) 20 MEQ tablet Take 1 tablet (20 mEq total) by mouth daily. 7 tablet 0  . pramipexole (MIRAPEX) 1 MG tablet Take 1 mg by mouth at bedtime.    . traMADol (ULTRAM) 50 MG tablet  Take 1 tablet (50 mg total) by mouth every 12 (twelve) hours as needed. 30 tablet 0  . zolpidem (AMBIEN) 10 MG tablet Take 10 mg by mouth at bedtime as needed for sleep.    . furosemide (LASIX) 20 MG tablet Take 20 mg by mouth daily.     No current facility-administered medications for this encounter.   Facility-Administered Medications Ordered in Other Encounters  Medication Dose Route Frequency Provider Last Rate Last Dose  . sodium chloride 0.9 % injection 10 mL  10 mL Intracatheter PRN Curt Bears, MD   10 mL at 01/29/15 9242    Physical Findings: The patient is in no acute distress. Patient is alert and oriented.  height is 5' 9.5" (1.765 m) and weight is 180 lb 8 oz (81.874 kg). His oral temperature is 97 F (36.1 C). His blood pressure is 186/90 and his pulse is 102. His respiration is 20 and oxygen  saturation is 100%. .     Lab Findings: Lab Results  Component Value Date   WBC 6.3 04/28/2015   HGB 12.0* 04/28/2015   HCT 36.7* 04/28/2015   MCV 92.9 04/28/2015   PLT 445* 04/28/2015     Radiographic Findings: Mr Thoracic Spine W Wo Contrast  04/15/2015  CLINICAL DATA:  Severe back pain.  History of lung cancer. EXAM: MRI THORACIC SPINE WITHOUT AND WITH CONTRAST TECHNIQUE: Multiplanar and multiecho pulse sequences of the thoracic spine were obtained without and with intravenous contrast. CONTRAST:  64m MULTIHANCE GADOBENATE DIMEGLUMINE 529 MG/ML IV SOLN COMPARISON:  None. FINDINGS: There is a mild chronic anterior T3 vertebral body compression fracture without marrow signal abnormality. The alignment is anatomic. There is no acute fracture or static listhesis. The marrow signal is normal. The thoracic spinal cord is normal in size and signal. The disc spaces are maintained. Small right pleural effusion. Enlarged right infraclavicular lymph node partially visualized measuring approximately 19 mm in short axis. T1-T2: No significant disc bulge, foraminal stenosis or central canal stenosis. T2-T3: Mild broad-based disc bulge. No foraminal or central canal stenosis. T3-T4: No significant disc bulge, foraminal stenosis or central canal stenosis. T4-T5: No significant disc bulge, foraminal stenosis or central canal stenosis. T5-T6: Small central disc protrusion. No foraminal or central canal stenosis. T6-T7: Small central disc protrusion impressing on the ventral thecal sac. No foraminal or central canal stenosis. T7-T8: Tiny central disc protrusion. No foraminal or central canal stenosis. T8-T9: Shallow right paracentral disc protrusion. No foraminal or central canal stenosis. T9-T10: No significant disc bulge, foraminal stenosis or central canal stenosis. T10-T11: No significant disc bulge, foraminal stenosis or central canal stenosis. T11-T12: No significant disc bulge, foraminal stenosis or central  canal stenosis. IMPRESSION: 1. No evidence of metastatic disease of the lumbar spine. 2. Thoracic spine spondylosis as described above. 3. Small right pleural effusion. Enlarged right infraclavicular lymph node partially visualized measuring approximately 19 mm in short axis. Electronically Signed   By: HKathreen Devoid  On: 04/15/2015 08:22   Mr Lumbar Spine W Wo Contrast  04/15/2015  CLINICAL DATA:  Low back pain, history of lung cancer EXAM: MRI LUMBAR SPINE WITHOUT AND WITH CONTRAST TECHNIQUE: Multiplanar and multiecho pulse sequences of the lumbar spine were obtained without and with intravenous contrast. CONTRAST:  131mMULTIHANCE GADOBENATE DIMEGLUMINE 529 MG/ML IV SOLN COMPARISON:  None. FINDINGS: The vertebral bodies of the lumbar spine are normal in size. There is grade 1 anterolisthesis of L5 on S1 secondary to bilateral L5 pars interarticularis defects. There is  normal bone marrow signal demonstrated throughout the vertebra. There is degenerative disc disease with disc height loss at L2-3, L3-4 L4-5 and L5-S1. The spinal cord is normal in signal and contour. The cord terminates normally at T12 . The nerve roots of the cauda equina and the filum terminale are normal. The visualized portions of the SI joints are unremarkable. The imaged intra-abdominal contents are unremarkable. T12-L1: No significant disc bulge. No evidence of neural foraminal stenosis. No central canal stenosis. L1-L2: Mild broad-based disc bulge. No evidence of neural foraminal stenosis. No central canal stenosis. L2-L3: Mild broad-based disc osteophyte complex. Mild bilateral facet arthropathy. Mild spinal stenosis. No evidence of neural foraminal stenosis. L3-L4: Mild broad-based disc bulge eccentric towards the right. Mild bilateral facet arthropathy. Moderate right foraminal stenosis. No left foraminal stenosis. Mild spinal stenosis. L4-L5: Mild broad-based disc bulge with a left foraminal/lateral disc protrusion with moderate left  foraminal stenosis. No right foraminal stenosis. Moderate bilateral facet arthropathy. No central canal stenosis. L5-S1: Grade 1 anterolisthesis with a mild broad-based disc bulge. Moderate bilateral foraminal stenosis. No central canal stenosis. IMPRESSION: 1. No evidence of metastatic disease of the lumbar spine. 2. At L3-4 there is a mild broad-based disc bulge eccentric towards the right. Mild bilateral facet arthropathy. Moderate right foraminal stenosis. No left foraminal stenosis. Mild spinal stenosis. 3. At L4-5 there is a mild broad-based disc bulge with a left foraminal/lateral disc protrusion with moderate left foraminal stenosis. No right foraminal stenosis. Moderate bilateral facet arthropathy. 4. There is grade 1 anterolisthesis of L5 on S1 secondary to bilateral L5 pars interarticularis defects. There is a mild broad-based disc bulge. Moderate bilateral foraminal stenosis. Electronically Signed   By: Kathreen Devoid   On: 04/15/2015 08:29    Impression:    I discussed with the patient the potential benefit of palliative radiation treatment to the right supraclavicular region. He is proceeding with chemotherapy at this time. We also discussed the advantages and disadvantages with regards to this versus waiting and seeing how this response to further chemotherapy. The patient has not received any radiation treatment to this region. Certainly, we could treat this area focally and with him receiving concurrent chemotherapy I would anticipate a approximate 3 week course of treatment.  Plan:  The patient stated that the area in the right neck is not sufficiently bothering him at this time to warrant palliative radiation treatment for several weeks. We will see how he is doing with this at his next follow-up visit after her upcoming MRI scan of the brain. Certainly the patient can be seen sooner if this worsens.   Jodelle Gross, M.D., Ph.D.

## 2015-05-08 ENCOUNTER — Ambulatory Visit: Payer: Medicare Other

## 2015-05-08 NOTE — Progress Notes (Signed)
Pt in for L arm assessment. 5cm x4cm erythematous area with 13cm redness following the vein. Pt reports this is better than yesterday. Pruritic, denies pain. Site evaluated by Drue Second, NP: Looks better, OK to cancel 10/28 appt. Follow up 11/2 as scheduled. Pt voiced understanding.

## 2015-05-09 ENCOUNTER — Encounter: Payer: Self-pay | Admitting: Nurse Practitioner

## 2015-05-09 ENCOUNTER — Ambulatory Visit: Payer: Medicare Other

## 2015-05-09 ENCOUNTER — Encounter: Payer: Self-pay | Admitting: *Deleted

## 2015-05-09 DIAGNOSIS — IMO0002 Reserved for concepts with insufficient information to code with codable children: Secondary | ICD-10-CM | POA: Insufficient documentation

## 2015-05-09 NOTE — Assessment & Plan Note (Signed)
Patient is status post right upper lobectomy.  He has also completed his radiation treatments.  He received cycle 2 of his Taxotere/Cyramza chemotherapy on 04/28/2015.  Patient reported to the [facility] today with complaint of a left forearm rash.  It does appear that patient has actually experienced a probable extravasation from his last peripherally infused chemotherapy.  See further notes regarding extravasation.  Patient will return tomorrow for recheck of his arm; and also is scheduled to follow-up next week on Wednesday, 05/14/2015 at 10 AM for recheck as well.  He is scheduled to return for cycle 3 of his chemotherapy; as well as labs and a follow-up visit on 05/19/2015.  She will need a restaging CT with contrast of the chest/abdomen/pelvis scheduled around 06/05/2015; prior to cycle 4 of his chemotherapy.

## 2015-05-09 NOTE — Progress Notes (Signed)
Pt saw [name] on 10/26 inquiring about when his next Brain MRI should be completed. Pt does not have a scan scheduled at this time.

## 2015-05-09 NOTE — Assessment & Plan Note (Signed)
For pressure and initial check here at the [facility] today was 176/83.  Patient states that he was in a hurry to get to the [facility]; did not take his regular blood pressure medications this morning.  Advised patient to take his blood pressure medications as soon as he arrives back home; and to recheck his blood pressure.  He was advised to call/return if his blood pressure remains elevated.

## 2015-05-09 NOTE — Progress Notes (Signed)
SYMPTOM MANAGEMENT CLINIC   HPI: [name] 74 y.o. male diagnosed with lung cancer; with brain metastasis.Currently undergoing Taxotere/Cyramza chemotherapy regimen.   Patient received cycle 2 of his Taxotere/Cyramza chemotherapy on 04/28/2015 via peripheral infusion.  Patient reported to the [place] today with complaint of a left forearm rash.  He denies any recent fevers or chills.  It does appear that patient has actually experienced a probable extravasation from his last peripherally infused chemotherapy.  Patient's left forearm with erythema that follows the vein; but no specific edema, warmth, tenderness, or red streaks.  Reviewed all with cancer Center pharmacist; and was advised that Taxotere is considered an irritant peripherally.  Patient was advised to apply cool, moist compresses to his arm, elevate his arm above the level of his heart whenever possible, and to take intermittent ibuprofen to decrease inflammation.  Per protocol-patient has plans to return tomorrow for recheck of his arm.   HPI  ROS  Past Medical History  Diagnosis Date  . Arthritis   . Wheezing   . Sore throat   . Carotid artery occlusion   . COPD (chronic obstructive pulmonary disease) ([place])   . Lumbar disc disease   . Restless leg syndrome   . Allergic rhinitis   . Hypertension   . Anxiety     anxiety  . Shortness of breath   . Lung mass   . GERD (gastroesophageal reflux disease)   . H/O hiatal hernia   . Pre-operative cardiovascular examination   . Nonspecific abnormal unspecified cardiovascular function study   . Peripheral vascular disease, unspecified ([place])   . Pneumonia 2014    ?   Marland Kitchen On home oxygen therapy     prn  . Constipation   . Cancer ([place]) 09/14/12    invasive mod diff squamous cell ca  . 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 S/P radiation therapy 12/04/14 srs    lt frontal brain    Past Surgical History  Procedure Laterality Date  . Carotid  endarterectomy  10/15/2010    left  . Hand surgery      repair of the left long, ring, and small finger after a saw injury  . Video bronchoscopy  07/24/2012    Procedure: VIDEO BRONCHOSCOPY WITH FLUORO;  Surgeon: Kathee Delton, MD;  Location: [facility];  Service: Cardiopulmonary;  Laterality: Bilateral;  . Lobectomy Right 09/14/2012    Procedure: LOBECTOMY;  Surgeon: Ivin Poot, MD;  Location: [facility];  Service: Thoracic;  Laterality: Right;  RIGHT UPPER LOBECTOMY  . Video assisted thoracoscopy Right 09/14/2012    Procedure: VIDEO ASSISTED THORACOSCOPY;  Surgeon: Ivin Poot, MD;  Location: [facility];  Service: Thoracic;  Laterality: Right;  . Video bronchoscopy with endobronchial ultrasound N/A 08/27/2014    Procedure: VIDEO BRONCHOSCOPY WITH ENDOBRONCHIAL ULTRASOUND;  Surgeon: Ivin Poot, MD;  Location: [facility] OR;  Service: Thoracic;  Laterality: N/A;  . [place]ene node biopsy Right 08/27/2014    Procedure: BIOPSY SCALENE NODE;  Surgeon: Ivin Poot, MD;  Location: [facility] OR;  Service: Thoracic;  Laterality: Right;  . Sterotactic radiosurgery Right 09/25/14    right parietal 30Gy/1 fx    has Occlusion and stenosis of carotid artery without mention of cerebral infarction; COPD (chronic obstructive pulmonary disease) ([place]); Lung mass; Pre-operative cardiovascular examination; Nonspecific abnormal unspecified cardiovascular function study; Peripheral vascular disease, unspecified ([place]); Lung cancer ([place]); Pain in limb; Coronary atherosclerosis of native coronary artery; Essential hypertension,  benign; Tobacco use disorder; Carotid stenosis; Aftercare following surgery of the circulatory system; Weakness-Hand and  Legs; COPD exacerbation ([place]); Tachycardia; Dyspnea; Brain metastasis ([place]); Chemotherapy induced neutropenia ([place]); Renal stone; Pyelonephritis; Dehydration; UTI (lower urinary tract infection); Left arm cellulitis; Blood poisoning ([place]); Sepsis ([place]); Encounter for antineoplastic  immunotherapy; Cancer associated pain; Rash; Syncope; Constipation; Insomnia; Hypokalemia; Peripheral edema; and Extravasation, infusion or chemotherapeutic agent on his problem list.    has No Known Allergies.    Medication List       This list is accurate as of: 05/07/15 11:59 PM.  Always use your most recent med list.               albuterol (2.5 MG/3ML) 0.083% nebulizer solution  Commonly known as:  PROVENTIL  Inhale 2.5 mg into the lungs every 6 (six) hours as needed.     amLODipine 5 MG tablet  Commonly known as:  NORVASC  Take 5 mg by mouth daily.     aspirin EC 81 MG tablet  Take 81 mg by mouth daily.     clonazePAM 0.5 MG tablet  Commonly known as:  KLONOPIN  Take 0.5 mg by mouth 2 (two) times daily as needed for anxiety. Takes one tablet by mouth two times daily as needed for anxiety.     furosemide 20 MG tablet  Commonly known as:  LASIX  Take 20 mg by mouth daily.     HYDROcodone-acetaminophen 7.5-325 MG tablet  Commonly known as:  NORCO  Take 1 tablet by mouth every 6 (six) hours as needed for moderate pain.     ipratropium-albuterol 0.5-2.5 (3) MG/3ML Soln  Commonly known as:  DUONEB  Take 3 mLs by nebulization 4 (four) times daily.     isosorbide mononitrate 30 MG 24 hr tablet  Commonly known as:  IMDUR  Take 1 tablet (30 mg total) by mouth daily.     lisinopril 40 MG tablet  Commonly known as:  PRINIVIL,ZESTRIL  Take 40 mg by mouth daily.     LORazepam 0.5 MG tablet  Commonly known as:  ATIVAN  Take 1 tablet by mouth or sublingually every 8 hours as needed for nausea. May take 1 tablet by mouth at bedtime.     metoprolol tartrate 25 MG tablet  Commonly known as:  LOPRESSOR  Take 25 mg by mouth 2 (two) times daily.     omeprazole 40 MG capsule  Commonly known as:  PRILOSEC  Take 40 mg by mouth daily.     potassium chloride SA 20 MEQ tablet  Commonly known as:  K-DUR,KLOR-CON  Take 1 tablet (20 mEq total) by mouth daily.     potassium  chloride SA 20 MEQ tablet  Commonly known as:  K-DUR,KLOR-CON  Take 1 tablet (20 mEq total) by mouth daily.     pramipexole 1 MG tablet  Commonly known as:  MIRAPEX  Take 1 mg by mouth at bedtime.     traMADol 50 MG tablet  Commonly known as:  ULTRAM  Take 1 tablet (50 mg total) by mouth every 12 (twelve) hours as needed.     zolpidem 10 MG tablet  Commonly known as:  AMBIEN  Take 10 mg by mouth at bedtime as needed for sleep.         PHYSICAL EXAMINATION  Oncology Vitals 05/08/2015 05/07/2015 05/07/2015 05/07/2015 05/07/2015 04/29/2015 04/28/2015  Height - - 177 cm 177 cm - - -  Weight - - 81.874 kg 81.874 kg - - -  Weight (lbs) - - 180 lbs 8 oz 180 lbs 8 oz - - -  BMI (kg/m2) - - 26.27 kg/m2 26.27 kg/m2 - - -  Temp 98 97.8 97 - 97.8 98.7 -  Pulse 90 99 102 - 99 103 91  Resp _0 - 20 - -  SpO2 96 99 100 - 100 - -  BSA (m2) - - 2 m2 2 m2 - - -   BP Readings from Last 3 Encounters:  05/08/15 147/66  05/07/15 176/83  05/07/15 186/90    Physical Exam  Constitutional: He is oriented to person, place, and time.  Patient appears fatigued, weak, and chronically ill.  HENT:  Head: Normocephalic and atraumatic.  Mouth/Throat: Oropharynx is clear and moist. No oropharyngeal exudate.  Eyes: Conjunctivae and EOM are normal. Pupils are equal, round, and reactive to light. Right eye exhibits no discharge. Left eye exhibits no discharge. No scleral icterus.  Neck: Normal range of motion.  Pulmonary/Chest: He has wheezes.  Patient on home O2 via nasal cannula at 2 L as baseline.  Musculoskeletal: Normal range of motion. He exhibits no edema or tenderness.  Neurological: He is alert and oriented to person, place, and time. Gait normal.  Skin: Skin is warm and dry. Rash noted. There is erythema. No pallor.  Patient has left forearm erythema; but no edema, warmth, tenderness, or red streaks.  Patient is also noted to have some trace erythema to the edge of his left hand.   Directly below his left pinky.  Psychiatric: Affect normal.  Nursing note and vitals reviewed.   LABORATORY DATA:. No visits with results within 3 Day(s) from this visit. Latest known visit with results is:  Appointment on 04/28/2015  Component Date Value Ref Range Status  . WBC 04/28/2015 6.3  4.0 - 10.3 10e3/uL Final  . NEUT# 04/28/2015 4.0  1.5 - 6.5 10e3/uL Final  . HGB 04/28/2015 12.0* 13.0 - 17.1 g/dL Final  . HCT 04/28/2015 36.7* 38.4 - 49.9 % Final  . Platelets 04/28/2015 445* 140 - 400 10e3/uL Final  . MCV 04/28/2015 92.9  79.3 - 98.0 fL Final  . MCH 04/28/2015 30.4  27.2 - 33.4 pg Final  . MCHC 04/28/2015 32.8  32.0 - 36.0 g/dL Final  . RBC 04/28/2015 3.95* 4.20 - 5.82 10e6/uL Final  . RDW 04/28/2015 15.1* 11.0 - 14.6 % Final  . lymph# 04/28/2015 1.4  0.9 - 3.3 10e3/uL Final  . MONO# 04/28/2015 0.7  0.1 - 0.9 10e3/uL Final  . Eosinophils Absolute 04/28/2015 0.0  0.0 - 0.5 10e3/uL Final  . Basophils Absolute 04/28/2015 0.1  0.0 - 0.1 10e3/uL Final  . NEUT% 04/28/2015 64.0  39.0 - 75.0 % Final  . LYMPH% 04/28/2015 22.5  14.0 - 49.0 % Final  . MONO% 04/28/2015 11.9  0.0 - 14.0 % Final  . EOS% 04/28/2015 0.1  0.0 - 7.0 % Final  . BASO% 04/28/2015 1.5  0.0 - 2.0 % Final  . TSH 04/28/2015 2.290  0.320 - 4.118 m(IU)/L Final  . Protein, ur 04/28/2015 Negative  Negative- <30 mg/dL Final  . Sodium 04/28/2015 139  136 - 145 mEq/L Final  . Potassium 04/28/2015 4.3  3.5 - 5.1 mEq/L Final  . Chloride 04/28/2015 103  98 - 109 mEq/L Final  . CO2 04/28/2015 27  22 - 29 mEq/L Final  . Glucose 04/28/2015 94  70 - 140 mg/dl Final   Glucose reference range is for nonfasting patients. Fasting glucose reference range is  70- 100.  Marland Kitchen BUN 04/28/2015 11.8  7.0 - 26.0 mg/dL Final  . Creatinine 04/28/2015 0.9  0.7 - 1.3 mg/dL Final  . Total Bilirubin 04/28/2015 0.32  0.20 - 1.20 mg/dL Final  . Alkaline Phosphatase 04/28/2015 89  40 - 150 U/L Final  . AST 04/28/2015 14  5 - 34 U/L Final  . ALT  04/28/2015 10  0 - 55 U/L Final  . Total Protein 04/28/2015 7.3  6.4 - 8.3 g/dL Final  . Albumin 04/28/2015 3.4* 3.5 - 5.0 g/dL Final  . Calcium 04/28/2015 9.6  8.4 - 10.4 mg/dL Final  . Anion Gap 04/28/2015 9  3 - 11 mEq/L Final  . EGFR 04/28/2015 83* >90 ml/min/1.73 m2 Final   eGFR is calculated using the CKD-EPI Creatinine Equation (2009)   Left forearm:     Left hand:       RADIOGRAPHIC STUDIES: No results found.  ASSESSMENT/PLAN:    Lung cancer Patient is status post right upper lobectomy.  He has also completed his radiation treatments.  He received cycle 2 of his Taxotere/Cyramza chemotherapy on 04/28/2015.  Patient reported to the [facility] today with complaint of a left forearm rash.  It does appear that patient has actually experienced a probable extravasation from his last peripherally infused chemotherapy.  See further notes regarding extravasation.  Patient will return tomorrow for recheck of his arm; and also is scheduled to follow-up next week on Wednesday, 05/14/2015 at 10 AM for recheck as well.  He is scheduled to return for cycle 3 of his chemotherapy; as well as labs and a follow-up visit on 05/19/2015.  She will need a restaging CT with contrast of the chest/abdomen/pelvis scheduled around 06/05/2015; prior to cycle 4 of his chemotherapy.  Essential hypertension, benign For pressure and initial check here at the [facility] today was 176/83.  Patient states that he was in a hurry to get to the [place]; did not take his regular blood pressure medications this morning.  Advised patient to take his blood pressure medications as soon as he arrives back home; and to recheck his blood pressure.  He was advised to call/return if his blood pressure remains elevated.  Extravasation, infusion or chemotherapeutic agent Patient received cycle 2 of his Taxotere/Cyramza chemotherapy on 04/28/2015 via peripheral infusion.  Patient reported to the [facility]  today with complaint of a left forearm rash.  It does appear that patient has actually experienced a probable extravasation from his last peripherally infused chemotherapy.  Patient's left forearm with erythema that follows the vein; but no specific edema, warmth, tenderness, or red streaks.  Reviewed all with cancer Center pharmacist; and was advised that Taxotere is considered an irritant peripherally.  Patient was advised to apply cool, moist compresses to his arm, elevate his arm above the level of his heart whenever possible, and to take intermittent ibuprofen to decrease inflammation.  Per protocol-patient has plans to return tomorrow for recheck of his arm.    Patient stated understanding of all instructions; and was in agreement with this plan of care. The patient knows to call the clinic with any problems, questions or concerns.   Review/collaboration with [name] regarding all aspects of patient's visit today.   Total time spent with patient was 25 minutes;  with greater than 75 percent of that time spent in face to face counseling regarding patient's symptoms,  and coordination of care and follow up.  Disclaimer:This dictation was prepared with Dragon/digital dictation along with  Software engineer. Any transcriptional errors that result from this process are unintentional.  Drue Second, NP 05/09/2015   ADDENDUM: Hematology/Oncology Attending: I had a face to face encounter with the patient. i recommended his care plan. This is a very pleasant 74 years old white male with metastatic non-small cell lung cancer, squamous cell carcinoma is currently undergoing systemic chemotherapy with docetaxel and Cyramza status post 2 cycles. The patient presented today for evaluation of rash and erythema in the left forearm most likely secondary to chemotherapy extravasation. I recommended for the patient to apply cool moist compresses to his forearm. We will also give the patient the  option of consideration of Port-A-Cath placement. He will continue with his current treatment as a scheduled. The patient would come back for follow-up visit in 2 weeks for reevaluation before starting cycle #3. He was advised to call immediately if he has any concerning symptoms in the interval.  Disclaimer: This note was dictated with voice recognition software. Similar sounding words can inadvertently be transcribed and may be missed upon review. Eilleen Kempf., MD 05/11/2015

## 2015-05-09 NOTE — Assessment & Plan Note (Signed)
Patient received cycle 2 of his Taxotere/Cyramza chemotherapy on 04/28/2015 via peripheral infusion.  Patient reported to the [facility] today with complaint of a left forearm rash.  It does appear that patient has actually experienced a probable extravasation from his last peripherally infused chemotherapy.  Patient's left forearm with erythema that follows the vein; but no specific edema, warmth, tenderness, or red streaks.  Reviewed all with cancer Center pharmacist; and was advised that Taxotere is considered an irritant peripherally.  Patient was advised to apply cool, moist compresses to his arm, elevate his arm above the level of his heart whenever possible, and to take intermittent ibuprofen to decrease inflammation.  Per protocol-patient has plans to return tomorrow for recheck of his arm.

## 2015-05-13 ENCOUNTER — Telehealth: Payer: Self-pay | Admitting: *Deleted

## 2015-05-13 NOTE — Telephone Encounter (Signed)
[name] (wife) called and LM  to request that appt. For 05-14-15 be cancelled as he is doing much better.  Will notify [employer] as well as [name].  Will do POF for cancellation.

## 2015-05-14 ENCOUNTER — Ambulatory Visit: Payer: Medicare Other

## 2015-05-14 ENCOUNTER — Telehealth: Payer: Self-pay | Admitting: *Deleted

## 2015-05-14 NOTE — Telephone Encounter (Signed)
Returned call  To patient he is asking if he will need a mask for the spot on his neck, he starts Chemotherapy  In Med Onc 05/19/15, he stated to go ahead and schedule his radiation, informed him I would e-mail MD and we would go from there, patient th naked this RN for the return call 12:11 PM

## 2015-05-19 ENCOUNTER — Other Ambulatory Visit (HOSPITAL_BASED_OUTPATIENT_CLINIC_OR_DEPARTMENT_OTHER): Payer: Medicare Other

## 2015-05-19 ENCOUNTER — Encounter: Payer: Self-pay | Admitting: Internal Medicine

## 2015-05-19 ENCOUNTER — Ambulatory Visit (HOSPITAL_BASED_OUTPATIENT_CLINIC_OR_DEPARTMENT_OTHER): Payer: Medicare Other

## 2015-05-19 ENCOUNTER — Ambulatory Visit (HOSPITAL_BASED_OUTPATIENT_CLINIC_OR_DEPARTMENT_OTHER): Payer: Medicare Other | Admitting: Internal Medicine

## 2015-05-19 VITALS — BP 169/73 | HR 93 | Temp 98.1°F | Resp 21 | Ht 69.5 in | Wt 182.8 lb

## 2015-05-19 DIAGNOSIS — Z5111 Encounter for antineoplastic chemotherapy: Secondary | ICD-10-CM | POA: Diagnosis not present

## 2015-05-19 DIAGNOSIS — C3491 Malignant neoplasm of unspecified part of right bronchus or lung: Secondary | ICD-10-CM

## 2015-05-19 DIAGNOSIS — C3411 Malignant neoplasm of upper lobe, right bronchus or lung: Secondary | ICD-10-CM | POA: Diagnosis present

## 2015-05-19 DIAGNOSIS — Z5112 Encounter for antineoplastic immunotherapy: Secondary | ICD-10-CM

## 2015-05-19 DIAGNOSIS — C7931 Secondary malignant neoplasm of brain: Secondary | ICD-10-CM | POA: Diagnosis not present

## 2015-05-19 LAB — COMPREHENSIVE METABOLIC PANEL (CC13)
ALT: 11 U/L (ref 0–55)
ANION GAP: 8 meq/L (ref 3–11)
AST: 17 U/L (ref 5–34)
Albumin: 3.4 g/dL — ABNORMAL LOW (ref 3.5–5.0)
Alkaline Phosphatase: 77 U/L (ref 40–150)
BUN: 12.8 mg/dL (ref 7.0–26.0)
CHLORIDE: 103 meq/L (ref 98–109)
CO2: 23 meq/L (ref 22–29)
Calcium: 9.3 mg/dL (ref 8.4–10.4)
Creatinine: 1 mg/dL (ref 0.7–1.3)
EGFR: 73 mL/min/{1.73_m2} — AB (ref >90)
Glucose: 104 mg/dl (ref 70–140)
Potassium: 4.5 mEq/L (ref 3.5–5.1)
SODIUM: 134 meq/L — AB (ref 136–145)
Total Bilirubin: 0.32 mg/dL (ref 0.20–1.20)
Total Protein: 6.5 g/dL (ref 6.4–8.3)

## 2015-05-19 LAB — CBC WITH DIFFERENTIAL/PLATELET
BASO%: 1.2 % (ref 0.0–2.0)
Basophils Absolute: 0.1 10*3/uL (ref 0.0–0.1)
EOS%: 0.1 % (ref 0.0–7.0)
Eosinophils Absolute: 0 10*3/uL (ref 0.0–0.5)
HCT: 34.5 % — ABNORMAL LOW (ref 38.4–49.9)
HGB: 11.4 g/dL — ABNORMAL LOW (ref 13.0–17.1)
LYMPH%: 24.2 % (ref 14.0–49.0)
MCH: 30.8 pg (ref 27.2–33.4)
MCHC: 33.2 g/dL (ref 32.0–36.0)
MCV: 92.8 fL (ref 79.3–98.0)
MONO#: 0.9 10*3/uL (ref 0.1–0.9)
MONO%: 14.7 % — AB (ref 0.0–14.0)
NEUT#: 3.7 10*3/uL (ref 1.5–6.5)
NEUT%: 59.8 % (ref 39.0–75.0)
PLATELETS: 273 10*3/uL (ref 140–400)
RBC: 3.71 10*6/uL — AB (ref 4.20–5.82)
RDW: 16 % — AB (ref 11.0–14.6)
WBC: 6.2 10*3/uL (ref 4.0–10.3)
lymph#: 1.5 10*3/uL (ref 0.9–3.3)

## 2015-05-19 MED ORDER — ACETAMINOPHEN 325 MG PO TABS
ORAL_TABLET | ORAL | Status: AC
  Filled 2015-05-19: qty 2

## 2015-05-19 MED ORDER — ACETAMINOPHEN 325 MG PO TABS
650.0000 mg | ORAL_TABLET | Freq: Once | ORAL | Status: AC
  Administered 2015-05-19: 650 mg via ORAL

## 2015-05-19 MED ORDER — SODIUM CHLORIDE 0.9 % IV SOLN
10.0000 mg/kg | Freq: Once | INTRAVENOUS | Status: AC
  Administered 2015-05-19: 800 mg via INTRAVENOUS
  Filled 2015-05-19: qty 20

## 2015-05-19 MED ORDER — SODIUM CHLORIDE 0.9 % IV SOLN
Freq: Once | INTRAVENOUS | Status: AC
  Administered 2015-05-19: 14:00:00 via INTRAVENOUS

## 2015-05-19 MED ORDER — DIPHENHYDRAMINE HCL 50 MG/ML IJ SOLN
50.0000 mg | Freq: Once | INTRAMUSCULAR | Status: AC
  Administered 2015-05-19: 50 mg via INTRAVENOUS

## 2015-05-19 MED ORDER — DOCETAXEL CHEMO INJECTION 160 MG/16ML
75.0000 mg/m2 | Freq: Once | INTRAVENOUS | Status: AC
  Administered 2015-05-19: 150 mg via INTRAVENOUS
  Filled 2015-05-19: qty 15

## 2015-05-19 MED ORDER — SODIUM CHLORIDE 0.9 % IV SOLN
Freq: Once | INTRAVENOUS | Status: AC
  Administered 2015-05-19: 14:00:00 via INTRAVENOUS
  Filled 2015-05-19: qty 4

## 2015-05-19 MED ORDER — DIPHENHYDRAMINE HCL 50 MG/ML IJ SOLN
INTRAMUSCULAR | Status: AC
  Filled 2015-05-19: qty 1

## 2015-05-19 NOTE — Patient Instructions (Signed)
[facility] Discharge Instructions for Patients Receiving Chemotherapy  Today you received the following chemotherapy agents taxotere/cyramza  To help prevent nausea and vomiting after your treatment, we encourage you to take your nausea medication as directed.    If you develop nausea and vomiting that is not controlled by your nausea medication, call the clinic.   BELOW ARE SYMPTOMS THAT SHOULD BE REPORTED IMMEDIATELY:  *FEVER GREATER THAN 100.5 F  *CHILLS WITH OR WITHOUT FEVER  NAUSEA AND VOMITING THAT IS NOT CONTROLLED WITH YOUR NAUSEA MEDICATION  *UNUSUAL SHORTNESS OF BREATH  *UNUSUAL BRUISING OR BLEEDING  TENDERNESS IN MOUTH AND THROAT WITH OR WITHOUT PRESENCE OF ULCERS  *URINARY PROBLEMS  *BOWEL PROBLEMS  UNUSUAL RASH Items with * indicate a potential emergency and should be followed up as soon as possible.  Feel free to call the clinic you have any questions or concerns. The clinic phone number is [phone].  

## 2015-05-19 NOTE — Progress Notes (Signed)
[facility] [phone]   [phone]) [phone]  OFFICE PROGRESS NOTE  [name], PA-C [place] [address]  DIAGNOSIS: Metastatic non-small cell lung cancer, squamous cell carcinoma initially diagnosed as Stage IIA (T2b., N0, M0) non-small cell lung cancer, squamous cell carcinoma diagnosed in December of 2013   PRIOR THERAPY:  1) Status post right upper lobectomy under the care of Dr. Prescott Gum on 09/14/2012.  2) Adjuvant chemotherapy with cisplatin 75 mg/M2 and docetaxel 75 mg/M2 with Neulasta support every 3 weeks, status post 1 cycle. Starting with cycle #2, patient will receive carboplatin for an AUC initially of 4.5 and paclitaxel at 175 mg per meter squared with Neulasta support given every 3 weeks. Status post a total of 3 cycles.  3) Systemic chemotherapy with carboplatin for AUC of 5 on day 1 and gemcitabine 1000 MG/M2 on days 1 and 8 every 3 weeks. First dose 09/11/2014. Status post 4 cycles. Discontinued secondary to intolerance 4) Immunotherapy with Nivolumab 3 MG/KG every 2 weeks. Status post 4 cycles. 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  CURRENT THERAPY: Systemic chemotherapy with docetaxel 75 MG/M2 and Cyramza 10 MG/KG every 3 weeks with Neulasta support. Status post 2 cycles  CHEMOTHERAPY INTENT: Palliative. CURRENT # OF CHEMOTHERAPY CYCLES: 3 CURRENT ANTIEMETICS: Zofran, dexamethasone and Compazine  CURRENT SMOKING STATUS: Quit smoking recently.  ORAL CHEMOTHERAPY AND CONSENT: None  CURRENT BISPHOSPHONATES USE: None  PAIN MANAGEMENT: 3/10 controlled by Norco when necessary  NARCOTICS INDUCED CONSTIPATION: Milk of magnesia on as-needed basis.  LIVING WILL AND CODE STATUS: No CODE BLUE.   INTERVAL HISTORY:  [name] 74 y.o. male returns to the clinic today for followup visit accompanied his wife. The patient is currently undergoing systemic chemotherapy with  docetaxel and Cyramza status post 2 cycles and has been tolerating his treatment fairly well with no significant complaints except for fatigue for a few days after the Neulasta injection. He also continues to have shortness of breath and currently on home oxygen. He denied having any significant chest pain but has mild cough and no hemoptysis. He denied having any significant nausea or vomiting, no fever or chills, no weight loss or night sweats. He also complains of occasional fuzzy vision started after the radiotherapy and he plans to see his eye doctor for evaluation. He is here today to start cycle #3 of his chemotherapy.   MEDICAL HISTORY: Past Medical History  Diagnosis Date  . Arthritis   . Wheezing   . Sore throat   . Carotid artery occlusion   . COPD (chronic obstructive pulmonary disease) ([place])   . Lumbar disc disease   . Restless leg syndrome   . Allergic rhinitis   . Hypertension   . Anxiety     anxiety  . Shortness of breath   . Lung mass   . GERD (gastroesophageal reflux disease)   . H/O hiatal hernia   . Pre-operative cardiovascular examination   . Nonspecific abnormal unspecified cardiovascular function study   . Peripheral vascular disease, unspecified ([place])   . Pneumonia 2014    ?   Marland Kitchen On home oxygen therapy     prn  . Constipation   . Cancer ([place]) 09/14/12    invasive mod diff squamous cell ca  . 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 S/P radiation therapy 12/04/14 srs    lt frontal brain  ALLERGIES:  has No Known Allergies.  MEDICATIONS:  Current Outpatient Prescriptions  Medication Sig Dispense Refill  . amLODipine (NORVASC) 10 MG tablet Takes half a tablet    . aspirin EC 81 MG tablet Take 81 mg by mouth daily.     . clonazePAM (KLONOPIN) 0.5 MG tablet Take 0.5 mg by mouth 2 (two) times daily as needed for anxiety. Takes one tablet by mouth two times daily as needed for anxiety.    . furosemide (LASIX) 20 MG tablet Take 20 mg by mouth  daily.    Marland Kitchen HYDROcodone-acetaminophen (NORCO) 7.5-325 MG tablet Take 1 tablet by mouth every 6 (six) hours as needed for moderate pain. 45 tablet 0  . ipratropium-albuterol (DUONEB) 0.5-2.5 (3) MG/3ML SOLN Take 3 mLs by nebulization 4 (four) times daily. (Patient taking differently: Take 3 mLs by nebulization 4 (four) times daily. Pt takes nebulizer 2 to 3 times daily.) 360 mL 2  . isosorbide mononitrate (IMDUR) 30 MG 24 hr tablet Take 1 tablet (30 mg total) by mouth daily. 30 tablet 3  . lisinopril (PRINIVIL,ZESTRIL) 40 MG tablet Take 40 mg by mouth daily.    Marland Kitchen LORazepam (ATIVAN) 0.5 MG tablet Take 1 tablet by mouth or sublingually every 8 hours as needed for nausea. May take 1 tablet by mouth at bedtime. 40 tablet 0  . metoprolol tartrate (LOPRESSOR) 25 MG tablet Take 25 mg by mouth 2 (two) times daily.     Marland Kitchen omeprazole (PRILOSEC) 40 MG capsule Take 40 mg by mouth daily.    . potassium chloride SA (K-DUR,KLOR-CON) 20 MEQ tablet Take 1 tablet (20 mEq total) by mouth daily. 30 tablet 1  . potassium chloride SA (K-DUR,KLOR-CON) 20 MEQ tablet Take 1 tablet (20 mEq total) by mouth daily. 7 tablet 0  . pramipexole (MIRAPEX) 1 MG tablet Take 1 mg by mouth at bedtime.    . traMADol (ULTRAM) 50 MG tablet Take 1 tablet (50 mg total) by mouth every 12 (twelve) hours as needed. 30 tablet 0  . zolpidem (AMBIEN) 10 MG tablet Take 10 mg by mouth at bedtime as needed for sleep.     No current facility-administered medications for this visit.   Facility-Administered Medications Ordered in Other Visits  Medication Dose Route Frequency Provider Last Rate Last Dose  . sodium chloride 0.9 % injection 10 mL  10 mL Intracatheter PRN Curt Bears, MD   10 mL at 01/29/15 0944    SURGICAL HISTORY:  Past Surgical History  Procedure Laterality Date  . Carotid endarterectomy  10/15/2010    left  . Hand surgery      repair of the left long, ring, and small finger after a saw injury  . Video bronchoscopy  07/24/2012      Procedure: VIDEO BRONCHOSCOPY WITH FLUORO;  Surgeon: Kathee Delton, MD;  Location: [facility];  Service: Cardiopulmonary;  Laterality: Bilateral;  . Lobectomy Right 09/14/2012    Procedure: LOBECTOMY;  Surgeon: Ivin Poot, MD;  Location: [facility];  Service: Thoracic;  Laterality: Right;  RIGHT UPPER LOBECTOMY  . Video assisted thoracoscopy Right 09/14/2012    Procedure: VIDEO ASSISTED THORACOSCOPY;  Surgeon: Ivin Poot, MD;  Location: [facility];  Service: Thoracic;  Laterality: Right;  . Video bronchoscopy with endobronchial ultrasound N/A 08/27/2014    Procedure: VIDEO BRONCHOSCOPY WITH ENDOBRONCHIAL ULTRASOUND;  Surgeon: Ivin Poot, MD;  [facility];  Service: Thoracic;  Laterality: N/A;  . Scalene node biopsy Right 08/27/2014    Procedure: BIOPSY  SCALENE NODE;  Surgeon: Ivin Poot, MD;  Location: [facility];  Service: Thoracic;  Laterality: Right;  . Sterotactic radiosurgery Right 09/25/14    right parietal 30Gy/1 fx    REVIEW OF SYSTEMS:  Constitutional: positive for fatigue Eyes: positive for visual disturbance Ears, nose, mouth, throat, and face: negative Respiratory: positive for cough and dyspnea on exertion Cardiovascular: negative Gastrointestinal: negative Genitourinary:negative Integument/breast: negative Hematologic/lymphatic: negative Musculoskeletal:negative Neurological: negative Behavioral/Psych: negative Endocrine: negative Allergic/Immunologic: negative   PHYSICAL EXAMINATION: General appearance: alert, cooperative and no distress Head: Normocephalic, without obvious abnormality, atraumatic Neck: no adenopathy, no JVD, supple, symmetrical, trachea midline and thyroid not enlarged, symmetric, no tenderness/mass/nodules Lymph nodes: Cervical, supraclavicular, and axillary nodes normal. Resp: wheezes bilaterally Back: symmetric, no curvature. ROM normal. No CVA tenderness. Cardio: regular rate and rhythm, S1, S2 normal, no murmur, click, rub or  gallop GI: soft, non-tender; bowel sounds normal; no masses,  no organomegaly Extremities: extremities normal, atraumatic, no cyanosis or edema Neurologic: Alert and oriented X 3, normal strength and tone. Normal symmetric reflexes. Normal coordination and gait  ECOG PERFORMANCE STATUS: 1 - Symptomatic but completely ambulatory  Blood pressure 169/73, pulse 93, temperature 98.1 F (36.7 C), temperature source Oral, resp. rate 21, height 5' 9.5" (1.765 m), weight 182 lb 12.8 oz (82.918 kg), SpO2 98 %.  LABORATORY DATA: Lab Results  Component Value Date   WBC 6.2 05/19/2015   HGB 11.4* 05/19/2015   HCT 34.5* 05/19/2015   MCV 92.8 05/19/2015   PLT 273 05/19/2015      Chemistry      Component Value Date/Time   NA 134* 05/19/2015 1143   NA 138 12/05/2014 0618   K 4.5 05/19/2015 1143   K 3.4* 12/05/2014 0618   CL 95* 12/05/2014 0618   CL 101 12/28/2012 0852   CO2 23 05/19/2015 1143   CO2 32 12/05/2014 0618   BUN 12.8 05/19/2015 1143   BUN 9 12/05/2014 0618   CREATININE 1.0 05/19/2015 1143   CREATININE 0.52* 12/05/2014 0618      Component Value Date/Time   CALCIUM 9.3 05/19/2015 1143   CALCIUM 9.2 12/05/2014 0618   ALKPHOS 77 05/19/2015 1143   ALKPHOS 71 12/05/2014 0618   AST 17 05/19/2015 1143   AST 24 12/05/2014 0618   ALT 11 05/19/2015 1143   ALT 30 12/05/2014 0618   BILITOT 0.32 05/19/2015 1143   BILITOT 0.2* 12/05/2014 0618       RADIOGRAPHIC STUDIES: No results found.  ASSESSMENT AND PLAN: This is a very pleasant 74 years old white male with history of stage IIA non-small cell lung cancer status post right upper lobectomy followed by 4 cycles of adjuvant chemotherapy.  The patient underwent systemic chemotherapy with carboplatin and gemcitabine status post 4 cycles discontinued secondary to intolerance and mild disease progression.  The patient completed treatment with Nivolumab status post 4 cycles but this was discontinued secondary to disease progression. He  also recently completed stereotactic radiotherapy to metastatic brain lesions. The patient is currently undergoing systemic chemotherapy with docetaxel and Cyramza status post 2 cycles and has been tolerating his treatment well except for the aching pain after Neulasta injection. I recommended for the patient to proceed with cycle #3 today as a scheduled. He would come back for follow-up visit in 3 weeks for reevaluation after repeating CT scan of the chest, abdomen and pelvis for restaging of his disease. He was advised to call immediately if he has any concerning symptoms in the interval.  The  patient voices understanding of current disease status and treatment options and is in agreement with the current care plan.  All questions were answered. The patient knows to call the clinic with any problems, questions or concerns. We can certainly see the patient much sooner if necessary.  Disclaimer: This note was dictated with voice recognition software. Similar sounding words can inadvertently be transcribed and may not be corrected upon review.

## 2015-05-20 ENCOUNTER — Telehealth: Payer: Self-pay | Admitting: *Deleted

## 2015-05-20 NOTE — Telephone Encounter (Signed)
Received vm message from pt's wife regarding pt's follow up appt for his neulasta injection. TC to pt and spoke with him. Advised him that his appt is on 05/21/15 @ 3:45 and that it is within the appropriate timing after his chemo he received yesterday afternoon.  He voiced understanding. No other issues noted.

## 2015-05-21 ENCOUNTER — Ambulatory Visit (HOSPITAL_BASED_OUTPATIENT_CLINIC_OR_DEPARTMENT_OTHER): Payer: Medicare Other

## 2015-05-21 ENCOUNTER — Ambulatory Visit
Admission: RE | Admit: 2015-05-21 | Discharge: 2015-05-21 | Disposition: A | Discharge status: Home / Self Care | Payer: Medicare Other | Source: Ambulatory Visit | Attending: Radiation Oncology | Admitting: Radiation Oncology

## 2015-05-21 VITALS — BP 134/79 | HR 104 | Temp 98.0°F

## 2015-05-21 DIAGNOSIS — C770 Secondary and unspecified malignant neoplasm of lymph nodes of head, face and neck: Secondary | ICD-10-CM

## 2015-05-21 DIAGNOSIS — T451X5A Adverse effect of antineoplastic and immunosuppressive drugs, initial encounter: Principal | ICD-10-CM

## 2015-05-21 DIAGNOSIS — C3491 Malignant neoplasm of unspecified part of right bronchus or lung: Secondary | ICD-10-CM

## 2015-05-21 DIAGNOSIS — D701 Agranulocytosis secondary to cancer chemotherapy: Secondary | ICD-10-CM | POA: Diagnosis present

## 2015-05-21 DIAGNOSIS — C7931 Secondary malignant neoplasm of brain: Secondary | ICD-10-CM | POA: Diagnosis present

## 2015-05-21 DIAGNOSIS — Z5189 Encounter for other specified aftercare: Secondary | ICD-10-CM | POA: Diagnosis not present

## 2015-05-21 DIAGNOSIS — Z510 Encounter for antineoplastic radiation therapy: Secondary | ICD-10-CM | POA: Diagnosis not present

## 2015-05-21 DIAGNOSIS — C3490 Malignant neoplasm of unspecified part of unspecified bronchus or lung: Secondary | ICD-10-CM | POA: Diagnosis present

## 2015-05-21 MED ORDER — PEGFILGRASTIM INJECTION 6 MG/0.6ML ~~LOC~~
6.0000 mg | PREFILLED_SYRINGE | Freq: Once | SUBCUTANEOUS | Status: AC
  Administered 2015-05-21: 6 mg via SUBCUTANEOUS
  Filled 2015-05-21: qty 0.6

## 2015-05-23 DIAGNOSIS — Z510 Encounter for antineoplastic radiation therapy: Secondary | ICD-10-CM | POA: Diagnosis not present

## 2015-05-28 ENCOUNTER — Encounter: Payer: Self-pay | Admitting: Radiation Oncology

## 2015-05-28 ENCOUNTER — Ambulatory Visit
Admission: RE | Admit: 2015-05-28 | Discharge: 2015-05-28 | Disposition: A | Discharge status: Home / Self Care | Payer: Medicare Other | Source: Ambulatory Visit | Attending: Radiation Oncology | Admitting: Radiation Oncology

## 2015-05-28 VITALS — BP 174/71 | HR 102 | Temp 97.9°F | Resp 20

## 2015-05-28 DIAGNOSIS — R918 Other nonspecific abnormal finding of lung field: Secondary | ICD-10-CM

## 2015-05-28 DIAGNOSIS — Z510 Encounter for antineoplastic radiation therapy: Secondary | ICD-10-CM | POA: Diagnosis not present

## 2015-05-28 MED ORDER — SONAFINE EX EMUL
1.0000 "application " | Freq: Two times a day (BID) | CUTANEOUS | Status: DC
  Administered 2015-05-28: 1 via TOPICAL
  Filled 2015-05-28: qty 45

## 2015-05-28 NOTE — Progress Notes (Addendum)
before meals and at bedtime, up to four times a day. Alternate with Managing Acute Radiation Side Effects for Head and Neck Cancer  Skin irritation:  . Sonafine Topical Emulsion: First-line topical cream to help soothe skin irritation.  Apply to skin in radiation fields at least 4 hours before radiotherapy, or any time after treatments during the rest of the day.  . Triple Antibiotic Ointment (Neosporin): Apply to areas of skin with moist breakdown to prevent infection.  . 1% hydrocortisone cream: Apply to areas of skin that are itching, up to three times a day.  Soreness in mouth or throat: . Baking Soda Rinse: a home remedy to soothe/cleanse mouth and loosen thick saliva.  Mix 1/2 teaspoon salt, 1/2 teaspoon baking soda, 1 pint water (16 oz or two cups).  Swish, gargle and spit as needed to soothe/cleanse mouth. Use as often as you want.  . Sucralfate (Carafate): coats throat to soothe it before meals or any time of day. Crush 1 tablet in 10 mL H20 and swallow up to four times a day.  . 2% viscous Lidocaine (Magic Mouth Wash): Soothes mouth and/or throat by numbing your mucous membranes. Mix 1 part 2% viscous lidocaine (Magic Mouth Wash), 1 part H20. Swish and/or swallow 7m of this Sucralfate (Carafate).  . Narcotics: Various short acting and long acting narcotics can be prescribed.  Often, medical oncology will prescribe these if you are receiving chemotherapy concurrently. Narcotics may cause constipation. It may be helpful to take a stool softener (Docusate Sodium) or gentle laxative (ie Senna or Polyethylene Glycol) to prevent constipation.  Having food in your stomach before ingesting a narcotic may reduce risk of stomach upset.  Thick Saliva: . Baking Soda Rinse: a home remedy to soothe/cleanse mouth and loosen thick saliva.  Mix 1/2 teaspoon salt, 1/2 teaspoon baking soda, 1 pint water (16 oz or two cups).  Swish, gargle, and spit as needed to soothe/cleanse mouth. Use as often as you  want.  . Some patients find Diet Ginger Ale or Papaya Juice to be helpful.  . In extreme cases, your physician may consider prescribing a Scopolamine transdermal patch which dries up your saliva.     Poor taste, or lack of taste:   . There are no well-established medications to combat taste bud changes from radiotherapy.  It often takes weeks to months to regain taste function.  Eating bland foods and drinking nutritional shakes  may help you maintain your weight when food is not enjoyable.  Some patients supplement their oral intake with a feeding tube.  Fatigue and weakness: . There is not a well-established safe and effective medication to combat radiation-induced fatigue.  However, if you are able to perform light exercise (such as a daily walk, yoga, recumbent stationary bicycling), this may combat fatigue and help you maintain muscle mass during treatment.  . Maintaining hydration and nutrition are also important.  If you have not been referred to a nutritionist and would like a referral, please let your nurse or physician know.  . Try to get at least 8 hours of sleep each night. You may need a daily nap, but try not to nap so late that it interferes with your nightly sleep schedule. Use Sonafine cream daily after radiation therapy and prn on neck , reviewed side effects again,patient had radiation to lung previously, vitals taken, teach back given, patient also stated he has been having nose bleeds with Chemotherapy was told this could happen and on his arms  Is thin and irritated from the chemo, his  Stated next chemotherapy is next week, asked if he has a humidifier with his oxtgen at home, on 1 liter n/c and is at 97%, no bleeding at present, says "It's like when you have a cold and blow your nose  Clear drainage and then some blood, and a few small clots", informed him to let Med oncology know if becomes worse and will see the patient Friday after work, he didn't need to see Dr. Lisbeth Renshaw  today,   BP 174/71 mmHg  Pulse 102  Temp(Src) 97.9 F (36.6 C) (Oral)  Resp 20  SpO2 97%  Wt Readings from Last 3 Encounters:  05/19/15 182 lb 12.8 oz (82.918 kg)  05/07/15 180 lb 8 oz (81.874 kg)  04/28/15 176 lb 8 oz (80.06 kg)

## 2015-05-29 ENCOUNTER — Ambulatory Visit
Admission: RE | Admit: 2015-05-29 | Discharge: 2015-05-29 | Disposition: A | Discharge status: Home / Self Care | Payer: Medicare Other | Source: Ambulatory Visit | Attending: Radiation Oncology | Admitting: Radiation Oncology

## 2015-05-29 DIAGNOSIS — Z510 Encounter for antineoplastic radiation therapy: Secondary | ICD-10-CM | POA: Diagnosis not present

## 2015-05-30 ENCOUNTER — Ambulatory Visit
Admission: RE | Admit: 2015-05-30 | Discharge: 2015-05-30 | Disposition: A | Discharge status: Home / Self Care | Payer: Medicare Other | Source: Ambulatory Visit | Attending: Radiation Oncology | Admitting: Radiation Oncology

## 2015-05-30 ENCOUNTER — Encounter: Payer: Self-pay | Admitting: *Deleted

## 2015-05-30 ENCOUNTER — Ambulatory Visit (HOSPITAL_BASED_OUTPATIENT_CLINIC_OR_DEPARTMENT_OTHER): Payer: Medicare Other | Admitting: Nurse Practitioner

## 2015-05-30 ENCOUNTER — Encounter: Payer: Self-pay | Admitting: Nurse Practitioner

## 2015-05-30 ENCOUNTER — Other Ambulatory Visit: Payer: Self-pay | Admitting: Medical Oncology

## 2015-05-30 ENCOUNTER — Ambulatory Visit (HOSPITAL_BASED_OUTPATIENT_CLINIC_OR_DEPARTMENT_OTHER): Payer: Medicare Other

## 2015-05-30 ENCOUNTER — Telehealth: Payer: Self-pay | Admitting: *Deleted

## 2015-05-30 DIAGNOSIS — Z510 Encounter for antineoplastic radiation therapy: Secondary | ICD-10-CM | POA: Diagnosis not present

## 2015-05-30 DIAGNOSIS — T8089XA Other complications following infusion, transfusion and therapeutic injection, initial encounter: Secondary | ICD-10-CM

## 2015-05-30 DIAGNOSIS — I878 Other specified disorders of veins: Secondary | ICD-10-CM

## 2015-05-30 DIAGNOSIS — R040 Epistaxis: Secondary | ICD-10-CM

## 2015-05-30 DIAGNOSIS — C3490 Malignant neoplasm of unspecified part of unspecified bronchus or lung: Secondary | ICD-10-CM | POA: Diagnosis not present

## 2015-05-30 LAB — HOLD TUBE, BLOOD BANK

## 2015-05-30 LAB — COMPREHENSIVE METABOLIC PANEL (CC13)
ALBUMIN: 3.5 g/dL (ref 3.5–5.0)
ALK PHOS: 145 U/L (ref 40–150)
ALT: 13 U/L (ref 0–55)
ANION GAP: 8 meq/L (ref 3–11)
AST: 22 U/L (ref 5–34)
BUN: 8.1 mg/dL (ref 7.0–26.0)
CO2: 27 mEq/L (ref 22–29)
Calcium: 9.4 mg/dL (ref 8.4–10.4)
Chloride: 105 mEq/L (ref 98–109)
Creatinine: 1.1 mg/dL (ref 0.7–1.3)
EGFR: 70 mL/min/{1.73_m2} — AB (ref >90)
GLUCOSE: 91 mg/dL (ref 70–140)
POTASSIUM: 4.5 meq/L (ref 3.5–5.1)
SODIUM: 140 meq/L (ref 136–145)
Total Bilirubin: <0.3 mg/dL (ref 0.20–1.20)
Total Protein: 6.8 g/dL (ref 6.4–8.3)

## 2015-05-30 LAB — CBC WITH DIFFERENTIAL/PLATELET
BASO%: 0.3 % (ref 0.0–2.0)
BASOS ABS: 0.1 10*3/uL (ref 0.0–0.1)
EOS ABS: 0 10*3/uL (ref 0.0–0.5)
EOS%: 0.1 % (ref 0.0–7.0)
HCT: 33.3 % — ABNORMAL LOW (ref 38.4–49.9)
HEMOGLOBIN: 10.8 g/dL — AB (ref 13.0–17.1)
LYMPH%: 9.1 % — AB (ref 14.0–49.0)
MCH: 30.3 pg (ref 27.2–33.4)
MCHC: 32.4 g/dL (ref 32.0–36.0)
MCV: 93.7 fL (ref 79.3–98.0)
MONO#: 1.9 10*3/uL — ABNORMAL HIGH (ref 0.1–0.9)
MONO%: 6 % (ref 0.0–14.0)
NEUT#: 26.2 10*3/uL — ABNORMAL HIGH (ref 1.5–6.5)
NEUT%: 84.5 % — ABNORMAL HIGH (ref 39.0–75.0)
PLATELETS: 243 10*3/uL (ref 140–400)
RBC: 3.56 10*6/uL — ABNORMAL LOW (ref 4.20–5.82)
RDW: 17.1 % — AB (ref 11.0–14.6)
WBC: 31 10*3/uL — ABNORMAL HIGH (ref 4.0–10.3)
lymph#: 2.8 10*3/uL (ref 0.9–3.3)

## 2015-05-30 NOTE — Telephone Encounter (Signed)
Pt reported to [facility] with R arm erythema and edema. Pt reports this is the arm from his last Chemo treatment. Pt states this started swelling 3 days after treatment. Progressivly has gotten worse. Pt wife reports epitaxies on a regular basis, copious drainage- blood tinged, clear mucus.

## 2015-05-30 NOTE — Progress Notes (Signed)
Pt's wife arrived to infusion room stating, "he had an IV in one arm and it broke out, then last week the other IV broke his hand out; how do we get a port? I've been asking him to come here so you all can see his arms but he refused.  I think he needs a port". Pt has radiation at 850 today, wife requesting to have pt's arms looked at, as he is agreeable since he is here. Pt to be added to [employer] schedule. Message sent to [facility] RN with [name].

## 2015-05-30 NOTE — Progress Notes (Addendum)
Patient sent home Heather RT  thought Dr. Lisbeth Renshaw was in GI clinic this am will see the patient Monday 9:10 AM

## 2015-05-30 NOTE — Assessment & Plan Note (Signed)
Patient reports several day history of intermittent, mild nosebleeds.  He states that he does blow his nose; but does not feel that he is blowing his nose too hard.  He wears oxygen continuously on a 24/7 basis.  Patient actually had a tiny nosebleeds of bright red blood while in the exam room today.  Advised patient to hold pressure when he has a nosebleed; and also to avoid blowing his nose if possible.  His nasal mucosa may very well be dried out from the oxygen via cannula.  Advised patient to try some Aquaphor on a Q-tip up into his nares to see if this helps as well.  Platelet count obtained today was stable at 243.  Will continue to monitor.

## 2015-05-30 NOTE — Progress Notes (Signed)
SYMPTOM MANAGEMENT CLINIC   HPI: [name] 74 y.o. male diagnosed with lung cancer; with brain metastasis.Currently undergoing Taxotere/Cyramza chemotherapy regimen.  Patient is also receiving radiation treatments on a daily basis.  Patient received cycle 3 of his docetaxel/Cyramza chemotherapy on 05/19/2015.  He also received Neulasta for growth factor support following this last cycle of chemotherapy.  He continues with daily radiation as well.   Patient had experienced a left forearm/hand extravasation with cycle 2 of his chemotherapy.  It appears the patient may very well have experienced another extravasation to the right forearm and hand following this last cycle of chemotherapy as well.   Patient reports suffered a history of right forearm erythema, edema, warmth, and mild tenderness that is radiating to his hand.  He denies any recent fevers or chills.  Patient also reports some mild, intermittent nosebleeds for the past few days as well.  HPI  ROS  Past Medical History  Diagnosis Date  . Arthritis   . Wheezing   . Sore throat   . Carotid artery occlusion   . COPD (chronic obstructive pulmonary disease) ([place])   . Lumbar disc disease   . Restless leg syndrome   . Allergic rhinitis   . Hypertension   . Anxiety     anxiety  . Shortness of breath   . Lung mass   . GERD (gastroesophageal reflux disease)   . H/O hiatal hernia   . Pre-operative cardiovascular examination   . Nonspecific abnormal unspecified cardiovascular function study   . Peripheral vascular disease, unspecified ([employer])   . Pneumonia 2014    ?   Marland Kitchen On home oxygen therapy     prn  . Constipation   . Cancer ([place]) 09/14/12    invasive mod diff squamous cell ca  . 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 S/P radiation therapy 12/04/14 srs    lt frontal brain    Past Surgical History  Procedure Laterality Date  . Carotid endarterectomy  10/15/2010    left  . Hand surgery      repair  of the left long, ring, and small finger after a saw injury  . Video bronchoscopy  07/24/2012    Procedure: VIDEO BRONCHOSCOPY WITH FLUORO;  Surgeon: Kathee Delton, MD;  Location: [facility];  Service: Cardiopulmonary;  Laterality: Bilateral;  . Lobectomy Right 09/14/2012    Procedure: LOBECTOMY;  Surgeon: Ivin Poot, MD;  Location: [facility];  Service: Thoracic;  Laterality: Right;  RIGHT UPPER LOBECTOMY  . Video assisted thoracoscopy Right 09/14/2012    Procedure: VIDEO ASSISTED THORACOSCOPY;  Surgeon: Ivin Poot, MD;  Location: [facility];  Service: Thoracic;  Laterality: Right;  . Video bronchoscopy with endobronchial ultrasound N/A 08/27/2014    Procedure: VIDEO BRONCHOSCOPY WITH ENDOBRONCHIAL ULTRASOUND;  Surgeon: Ivin Poot, MD;  Location: [facility] OR;  Service: Thoracic;  Laterality: N/A;  . Scalene node biopsy Right 08/27/2014    Procedure: BIOPSY SCALENE NODE;  Surgeon: Ivin Poot, MD;  Location: [facility] OR;  Service: Thoracic;  Laterality: Right;  . Sterotactic radiosurgery Right 09/25/14    right parietal 30Gy/1 fx    has Occlusion and stenosis of carotid artery without mention of cerebral infarction; COPD (chronic obstructive pulmonary disease) ([place]); Lung mass; Pre-operative cardiovascular examination; Nonspecific abnormal unspecified cardiovascular function study; Peripheral vascular disease, unspecified ([place]); Lung cancer ([place]); Pain in limb; Coronary atherosclerosis of native coronary artery; Essential hypertension, benign; Tobacco use disorder; Carotid stenosis;  Aftercare following surgery of the circulatory system; Weakness-Hand and  Legs; COPD exacerbation ([place]); Tachycardia; Dyspnea; Brain metastasis ([place]); Chemotherapy induced neutropenia ([place]); Renal stone; Pyelonephritis; Dehydration; UTI (lower urinary tract infection); Left arm cellulitis; Blood poisoning ([place]); Sepsis ([place]); Encounter for antineoplastic immunotherapy; Cancer associated pain; Rash; Syncope; Constipation; Insomnia;  Hypokalemia; Peripheral edema; Extravasation, infusion or chemotherapeutic agent; and Epistaxis on his problem list.    has No Known Allergies.    Medication List       This list is accurate as of: 05/30/15 10:49 AM.  Always use your most recent med list.               amLODipine 10 MG tablet  Commonly known as:  NORVASC  Takes half a tablet     aspirin EC 81 MG tablet  Take 81 mg by mouth daily.     clonazePAM 0.5 MG tablet  Commonly known as:  KLONOPIN  Take 0.5 mg by mouth 2 (two) times daily as needed for anxiety. Takes one tablet by mouth two times daily as needed for anxiety.     furosemide 20 MG tablet  Commonly known as:  LASIX  Take 20 mg by mouth daily.     HYDROcodone-acetaminophen 7.5-325 MG tablet  Commonly known as:  NORCO  Take 1 tablet by mouth every 6 (six) hours as needed for moderate pain.     ipratropium-albuterol 0.5-2.5 (3) MG/3ML Soln  Commonly known as:  DUONEB  Take 3 mLs by nebulization 4 (four) times daily.     isosorbide mononitrate 30 MG 24 hr tablet  Commonly known as:  IMDUR  Take 1 tablet (30 mg total) by mouth daily.     lisinopril 40 MG tablet  Commonly known as:  PRINIVIL,ZESTRIL  Take 40 mg by mouth daily.     metoprolol tartrate 25 MG tablet  Commonly known as:  LOPRESSOR  Take 25 mg by mouth 2 (two) times daily.     omeprazole 40 MG capsule  Commonly known as:  PRILOSEC  Take 40 mg by mouth daily.     potassium chloride SA 20 MEQ tablet  Commonly known as:  K-DUR,KLOR-CON  Take 1 tablet (20 mEq total) by mouth daily.     pramipexole 1 MG tablet  Commonly known as:  MIRAPEX  Take 1 mg by mouth at bedtime.     SONAFINE  Apply 1 application topically daily.     traMADol 50 MG tablet  Commonly known as:  ULTRAM  Take 1 tablet (50 mg total) by mouth every 12 (twelve) hours as needed.     zolpidem 10 MG tablet  Commonly known as:  AMBIEN  Take 10 mg by mouth at bedtime as needed for sleep.         PHYSICAL  EXAMINATION  Oncology Vitals 05/30/2015 05/28/2015 05/21/2015 05/19/2015 05/08/2015 05/07/2015 05/07/2015  Height - - - 177 cm - - 177 cm  Weight - - - 82.918 kg - - 81.874 kg  Weight (lbs) - - - 182 lbs 13 oz - - 180 lbs 8 oz  BMI (kg/m2) - - - 26.61 kg/m2 - - 26.27 kg/m2  Temp 97.9 97.9 98 98.1 98 97.8 97  Pulse 97 102 104 93 90 99 102  Resp 18 20 - '21 18 20 20  '$ SpO2 98 97 - 98 96 99 100  BSA (m2) - - - 2.02 m2 - - 2 m2   BP Readings from Last 3 Encounters:  05/30/15 159/78  05/28/15 174/71  05/21/15 134/79    Physical Exam  Constitutional: He is oriented to person, place, and time.  Patient appears fatigued, weak, and chronically ill.  HENT:  Head: Normocephalic and atraumatic.  Mouth/Throat: Oropharynx is clear and moist. No oropharyngeal exudate.  Eyes: Conjunctivae and EOM are normal. Pupils are equal, round, and reactive to light. Right eye exhibits no discharge. Left eye exhibits no discharge. No scleral icterus.  Neck: Normal range of motion.  Pulmonary/Chest: Effort normal. No respiratory distress.  Patient on home O2 via nasal cannula at 2 L as baseline.  Musculoskeletal: Normal range of motion. He exhibits no edema or tenderness.  Neurological: He is alert and oriented to person, place, and time. Gait normal.  Skin: Skin is warm and dry. Rash noted. There is erythema. No pallor.  Patient's previous left forearm extravasation site continues to slowly resolve.  Right forearm and hand with most recent apparent chemotherapy extravasation; with edema/erythema/warmth/tenderness noted.  Psychiatric: Affect normal.  Nursing note and vitals reviewed.   LABORATORY DATA:. Appointment on 05/30/2015  Component Date Value Ref Range Status  . WBC 05/30/2015 31.0* 4.0 - 10.3 10e3/uL Final  . NEUT# 05/30/2015 26.2* 1.5 - 6.5 10e3/uL Final  . HGB 05/30/2015 10.8* 13.0 - 17.1 g/dL Final  . HCT 05/30/2015 33.3* 38.4 - 49.9 % Final  . Platelets 05/30/2015 243  140 - 400 10e3/uL  Final  . MCV 05/30/2015 93.7  79.3 - 98.0 fL Final  . MCH 05/30/2015 30.3  27.2 - 33.4 pg Final  . MCHC 05/30/2015 32.4  32.0 - 36.0 g/dL Final  . RBC 05/30/2015 3.56* 4.20 - 5.82 10e6/uL Final  . RDW 05/30/2015 17.1* 11.0 - 14.6 % Final  . lymph# 05/30/2015 2.8  0.9 - 3.3 10e3/uL Final  . MONO# 05/30/2015 1.9* 0.1 - 0.9 10e3/uL Final  . Eosinophils Absolute 05/30/2015 0.0  0.0 - 0.5 10e3/uL Final  . Basophils Absolute 05/30/2015 0.1  0.0 - 0.1 10e3/uL Final  . NEUT% 05/30/2015 84.5* 39.0 - 75.0 % Final  . LYMPH% 05/30/2015 9.1* 14.0 - 49.0 % Final  . MONO% 05/30/2015 6.0  0.0 - 14.0 % Final  . EOS% 05/30/2015 0.1  0.0 - 7.0 % Final  . BASO% 05/30/2015 0.3  0.0 - 2.0 % Final   Right forearm:    Right hand:    RADIOGRAPHIC STUDIES: No results found.  ASSESSMENT/PLAN:    Lung cancer Patient received cycle 3 of [place] docetaxel/Cyramza chemotherapy on 05/19/2015.  He also received Neulasta for growth factor support following this last cycle of chemotherapy.  He continues with daily radiation as well.  Patient had experienced a left forearm/hand extravasation with cycle 2 of his chemotherapy.  It appears the patient may very well have experienced another extravasation to the right forearm and hand.  Following this last cycle of chemotherapy as well.  See further notes for details.  Patient is also reporting some intermittent, mild nosebleeds at times.  Patient continues with oxygen on a 24/7 basis as his baseline.  Blood counts obtained today reveal WBC 31.0, ANC 26.2, hemoglobin 10.8, platelet count 243.  Patient is scheduled to return for a restaging CT of his chest on 06/04/2015.  Patient is scheduled to return for labs, visit, and chemotherapy on 06/09/2015.  Extravasation, infusion or chemotherapeutic agent Patient received cycle 3 of [place] docetaxel/Cyramza chemotherapy on 05/19/2015.  He also received Neulasta for growth factor support following this last cycle of chemotherapy.   He continues with daily radiation as  well.   Patient had experienced a left forearm/hand extravasation with cycle 2 of his chemotherapy.  It appears the patient may very well have experienced another extravasation to the right forearm and hand following this last cycle of chemotherapy as well.   Patient reports suffered a history of right forearm erythema, edema, warmth, and mild tenderness that is radiating to his hand.  He denies any recent fevers or chills.  On exam.-Patient does have erythema, edema, warmth, and mild tenderness to his entire right forearm and his right hand.  There is trace red streaks following the vein going up around his elbow as well.  Patient was advised to continue with cool/moist compresses to the site and to elevate his arm above the level of his heart whenever possible.  He may also take small amount of ibuprofen to help decrease to the inflammation as well.  Patient will follow the extravasation protocol again; and will return this coming Monday, 06/02/2015 following his radiation treatment for a recheck of his arm.  Also, will order/arrange for a Port-A-Cath to be inserted as soon as possible.    Epistaxis Patient reports several day history of intermittent, mild nosebleeds.  He states that he does blow his nose; but does not feel that he is blowing his nose too hard.  He wears oxygen continuously on a 24/7 basis.  Patient actually had a tiny nosebleeds of bright red blood while in the exam room today.  Advised patient to hold pressure when he has a nosebleed; and also to avoid blowing his nose if possible.  His nasal mucosa may very well be dried out from the oxygen via cannula.  Advised patient to try some Aquaphor on a Q-tip up into his nares to see if this helps as well.  Platelet count obtained today was stable at 243.  Will continue to monitor.   Patient stated understanding of all instructions; and was in agreement with this plan of care. The patient  knows to call the clinic with any problems, questions or concerns.   Review/collaboration with [name] regarding all aspects of patient's visit today.   Total time spent with patient was 25 minutes;  with greater than 75 percent of that time spent in face to face counseling regarding patient's symptoms,  and coordination of care and follow up.  Disclaimer:This dictation was prepared with Dragon/digital dictation along with [employer]. Any transcriptional errors that result from this process are unintentional.  Drue Second, NP 05/30/2015

## 2015-05-30 NOTE — Assessment & Plan Note (Addendum)
Patient received cycle 3 of [place] docetaxel/Cyramza chemotherapy on 05/19/2015.  He also received Neulasta for growth factor support following this last cycle of chemotherapy.  He continues with daily radiation as well.   Patient had experienced a left forearm/hand extravasation with cycle 2 of his chemotherapy.  It appears the patient may very well have experienced another extravasation to the right forearm and hand following this last cycle of chemotherapy as well.   Patient reports suffered a history of right forearm erythema, edema, warmth, and mild tenderness that is radiating to his hand.  He denies any recent fevers or chills.  On exam.-Patient does have erythema, edema, warmth, and mild tenderness to his entire right forearm and his right hand.  There is trace red streaks following the vein going up around his elbow as well.  Patient was advised to continue with cool/moist compresses to the site and to elevate his arm above the level of his heart whenever possible.  He may also take small amount of ibuprofen to help decrease to the inflammation as well.  Patient will follow the extravasation protocol again; and will return this coming Monday, 06/02/2015 following his radiation treatment for a recheck of his arm.  Also, will order/arrange for a Port-A-Cath to be inserted as soon as possible.

## 2015-05-30 NOTE — Assessment & Plan Note (Addendum)
Patient received cycle 3 of [place] docetaxel/Cyramza chemotherapy on 05/19/2015.  He also received Neulasta for growth factor support following this last cycle of chemotherapy.  He continues with daily radiation as well.  Patient had experienced a left forearm/hand extravasation with cycle 2 of his chemotherapy.  It appears the patient may very well have experienced another extravasation to the right forearm and hand.  Following this last cycle of chemotherapy as well.  See further notes for details.  Patient is also reporting some intermittent, mild nosebleeds at times.  Patient continues with oxygen on a 24/7 basis as his baseline.  Blood counts obtained today reveal WBC 31.0, ANC 26.2, hemoglobin 10.8, platelet count 243.  Patient is scheduled to return for a restaging CT of his chest on 06/04/2015.  Patient is scheduled to return for labs, visit, and chemotherapy on 06/09/2015.

## 2015-06-01 ENCOUNTER — Ambulatory Visit: Payer: Medicare Other

## 2015-06-02 ENCOUNTER — Other Ambulatory Visit: Payer: Self-pay | Admitting: Radiology

## 2015-06-02 ENCOUNTER — Ambulatory Visit
Admission: RE | Admit: 2015-06-02 | Discharge: 2015-06-02 | Disposition: A | Discharge status: Home / Self Care | Payer: Medicare Other | Source: Ambulatory Visit | Attending: Radiation Oncology | Admitting: Radiation Oncology

## 2015-06-02 ENCOUNTER — Ambulatory Visit: Payer: Medicare Other

## 2015-06-02 DIAGNOSIS — Z510 Encounter for antineoplastic radiation therapy: Secondary | ICD-10-CM | POA: Diagnosis not present

## 2015-06-03 ENCOUNTER — Ambulatory Visit (HOSPITAL_COMMUNITY)
Admission: RE | Admit: 2015-06-03 | Discharge: 2015-06-03 | Disposition: A | Discharge status: Home / Self Care | Payer: Medicare Other | Source: Ambulatory Visit | Attending: Internal Medicine | Admitting: Internal Medicine

## 2015-06-03 ENCOUNTER — Ambulatory Visit: Admission: RE | Admit: 2015-06-03 | Payer: Medicare Other | Source: Ambulatory Visit

## 2015-06-03 ENCOUNTER — Encounter (HOSPITAL_COMMUNITY): Payer: Self-pay

## 2015-06-03 ENCOUNTER — Ambulatory Visit (HOSPITAL_BASED_OUTPATIENT_CLINIC_OR_DEPARTMENT_OTHER): Payer: Medicare Other | Admitting: Nurse Practitioner

## 2015-06-03 ENCOUNTER — Ambulatory Visit: Payer: Medicare Other

## 2015-06-03 ENCOUNTER — Encounter: Payer: Self-pay | Admitting: Radiation Oncology

## 2015-06-03 ENCOUNTER — Ambulatory Visit
Admission: RE | Admit: 2015-06-03 | Discharge: 2015-06-03 | Disposition: A | Discharge status: Home / Self Care | Payer: Medicare Other | Source: Ambulatory Visit | Attending: Radiation Oncology | Admitting: Radiation Oncology

## 2015-06-03 ENCOUNTER — Encounter: Payer: Self-pay | Admitting: Nurse Practitioner

## 2015-06-03 ENCOUNTER — Other Ambulatory Visit: Payer: Self-pay | Admitting: Internal Medicine

## 2015-06-03 VITALS — BP 155/75 | HR 100 | Temp 98.1°F | Resp 22 | Wt 180.1 lb

## 2015-06-03 DIAGNOSIS — Z9981 Dependence on supplemental oxygen: Secondary | ICD-10-CM | POA: Insufficient documentation

## 2015-06-03 DIAGNOSIS — R040 Epistaxis: Secondary | ICD-10-CM

## 2015-06-03 DIAGNOSIS — C3491 Malignant neoplasm of unspecified part of right bronchus or lung: Secondary | ICD-10-CM | POA: Diagnosis present

## 2015-06-03 DIAGNOSIS — C770 Secondary and unspecified malignant neoplasm of lymph nodes of head, face and neck: Secondary | ICD-10-CM | POA: Insufficient documentation

## 2015-06-03 DIAGNOSIS — I739 Peripheral vascular disease, unspecified: Secondary | ICD-10-CM | POA: Diagnosis not present

## 2015-06-03 DIAGNOSIS — F419 Anxiety disorder, unspecified: Secondary | ICD-10-CM | POA: Diagnosis not present

## 2015-06-03 DIAGNOSIS — I10 Essential (primary) hypertension: Secondary | ICD-10-CM | POA: Insufficient documentation

## 2015-06-03 DIAGNOSIS — Z8249 Family history of ischemic heart disease and other diseases of the circulatory system: Secondary | ICD-10-CM | POA: Insufficient documentation

## 2015-06-03 DIAGNOSIS — Z87891 Personal history of nicotine dependence: Secondary | ICD-10-CM | POA: Diagnosis not present

## 2015-06-03 DIAGNOSIS — Z7982 Long term (current) use of aspirin: Secondary | ICD-10-CM | POA: Diagnosis not present

## 2015-06-03 DIAGNOSIS — C3490 Malignant neoplasm of unspecified part of unspecified bronchus or lung: Secondary | ICD-10-CM | POA: Insufficient documentation

## 2015-06-03 DIAGNOSIS — C3492 Malignant neoplasm of unspecified part of left bronchus or lung: Secondary | ICD-10-CM | POA: Diagnosis not present

## 2015-06-03 DIAGNOSIS — T8089XD Other complications following infusion, transfusion and therapeutic injection, subsequent encounter: Secondary | ICD-10-CM

## 2015-06-03 DIAGNOSIS — G2581 Restless legs syndrome: Secondary | ICD-10-CM | POA: Insufficient documentation

## 2015-06-03 DIAGNOSIS — K219 Gastro-esophageal reflux disease without esophagitis: Secondary | ICD-10-CM | POA: Insufficient documentation

## 2015-06-03 DIAGNOSIS — I878 Other specified disorders of veins: Secondary | ICD-10-CM

## 2015-06-03 DIAGNOSIS — Z923 Personal history of irradiation: Secondary | ICD-10-CM | POA: Diagnosis not present

## 2015-06-03 DIAGNOSIS — J449 Chronic obstructive pulmonary disease, unspecified: Secondary | ICD-10-CM | POA: Insufficient documentation

## 2015-06-03 LAB — CBC WITH DIFFERENTIAL/PLATELET
Basophils Absolute: 0 10*3/uL (ref 0.0–0.1)
Basophils Relative: 0 %
EOS PCT: 0 %
Eosinophils Absolute: 0 10*3/uL (ref 0.0–0.7)
HEMATOCRIT: 36 % — AB (ref 39.0–52.0)
Hemoglobin: 11.3 g/dL — ABNORMAL LOW (ref 13.0–17.0)
LYMPHS ABS: 2.3 10*3/uL (ref 0.7–4.0)
Lymphocytes Relative: 16 %
MCH: 30.5 pg (ref 26.0–34.0)
MCHC: 31.4 g/dL (ref 30.0–36.0)
MCV: 97.3 fL (ref 78.0–100.0)
MONO ABS: 1.5 10*3/uL — AB (ref 0.1–1.0)
MONOS PCT: 10 %
NEUTROS ABS: 10.7 10*3/uL — AB (ref 1.7–7.7)
Neutrophils Relative %: 74 %
PLATELETS: 232 10*3/uL (ref 150–400)
RBC: 3.7 MIL/uL — ABNORMAL LOW (ref 4.22–5.81)
RDW: 17.1 % — AB (ref 11.5–15.5)
WBC: 14.5 10*3/uL — AB (ref 4.0–10.5)

## 2015-06-03 LAB — PROTIME-INR
INR: 0.99 (ref 0.00–1.49)
Prothrombin Time: 13.3 seconds (ref 11.6–15.2)

## 2015-06-03 LAB — APTT: APTT: 31 s (ref 24–37)

## 2015-06-03 MED ORDER — LIDOCAINE HCL 1 % IJ SOLN
INTRAMUSCULAR | Status: AC
  Filled 2015-06-03: qty 20

## 2015-06-03 MED ORDER — FENTANYL CITRATE (PF) 100 MCG/2ML IJ SOLN
INTRAMUSCULAR | Status: AC | PRN
  Administered 2015-06-03: 50 ug via INTRAVENOUS

## 2015-06-03 MED ORDER — LIDOCAINE-EPINEPHRINE 2 %-1:100000 IJ SOLN
INTRAMUSCULAR | Status: AC
  Filled 2015-06-03: qty 1

## 2015-06-03 MED ORDER — MIDAZOLAM HCL 2 MG/2ML IJ SOLN
INTRAMUSCULAR | Status: AC
  Filled 2015-06-03: qty 6

## 2015-06-03 MED ORDER — MIDAZOLAM HCL 2 MG/2ML IJ SOLN
INTRAMUSCULAR | Status: AC | PRN
  Administered 2015-06-03: 0.5 mg via INTRAVENOUS
  Administered 2015-06-03: 1 mg via INTRAVENOUS

## 2015-06-03 MED ORDER — CEFAZOLIN SODIUM-DEXTROSE 2-3 GM-% IV SOLR
2.0000 g | INTRAVENOUS | Status: AC
  Administered 2015-06-03: 2 g via INTRAVENOUS

## 2015-06-03 MED ORDER — ALBUTEROL SULFATE (2.5 MG/3ML) 0.083% IN NEBU
2.5000 mg | INHALATION_SOLUTION | RESPIRATORY_TRACT | Status: AC
  Administered 2015-06-03: 2.5 mg via RESPIRATORY_TRACT
  Filled 2015-06-03: qty 3

## 2015-06-03 MED ORDER — SODIUM CHLORIDE 0.9 % IV SOLN
INTRAVENOUS | Status: DC
  Administered 2015-06-03: 500 mL via INTRAVENOUS

## 2015-06-03 MED ORDER — FENTANYL CITRATE (PF) 100 MCG/2ML IJ SOLN
INTRAMUSCULAR | Status: AC
  Filled 2015-06-03: qty 4

## 2015-06-03 MED ORDER — CEFAZOLIN SODIUM-DEXTROSE 2-3 GM-% IV SOLR
INTRAVENOUS | Status: AC
  Administered 2015-06-03: 2 g via INTRAVENOUS
  Filled 2015-06-03: qty 50

## 2015-06-03 MED ORDER — HEPARIN SOD (PORK) LOCK FLUSH 100 UNIT/ML IV SOLN
INTRAVENOUS | Status: AC
  Filled 2015-06-03: qty 5

## 2015-06-03 MED ORDER — HEPARIN SOD (PORK) LOCK FLUSH 100 UNIT/ML IV SOLN
INTRAVENOUS | Status: AC | PRN
  Administered 2015-06-03: 500 [IU]

## 2015-06-03 NOTE — Progress Notes (Signed)
Pt is ready for discharge post port placement. Pt has 1330 Rad Onc appt and placed call to Rad Onc to see if patient could be seen earlier. Was told to send patient to [facility] to register and they would try and accommodate this after his scheduled appointment with the Dr.. Pt discharged via w/c accompanied by staff and family to go to [facility]

## 2015-06-03 NOTE — Progress Notes (Signed)
Pt is awake alert and drinking cola. Voices no c/o. BP in right and left arms are 20 points different. Wife states she was "always told by his cardiologist to take his BP in left arm because it always runs low in right arm"

## 2015-06-03 NOTE — Progress Notes (Signed)
  Radiation Oncology         [phone] ________________________________  Name: [name] MRN: [id number]  Date: 05/21/2015  DOB: 20-Jan-1941  SIMULATION AND TREATMENT PLANNING NOTE  DIAGNOSIS:  Metastatic lung cancer  Site:  Right neck  NARRATIVE:  The patient was brought to the [facility].  Identity was confirmed.  All relevant records and images related to the planned course of therapy were reviewed.   Written consent to proceed with treatment was confirmed which was freely given after reviewing the details related to the planned course of therapy had been reviewed with the patient.  Then, the patient was set-up in a stable reproducible  supine position for radiation therapy.  CT images were obtained.  Surface markings were placed.    Medically necessary complex treatment device(s) for immobilization:  Customized thermoplastic head cast.   The CT images were loaded into the planning software.  Then the target and avoidance structures were contoured.  Treatment planning then occurred.  The radiation prescription was entered and confirmed.  A total of 2 complex treatment devices were fabricated which relate to the designed radiation treatment fields. Each of these customized fields/ complex treatment devices will be used on a daily basis during the radiation course. I have requested : 3D Simulation  I have requested a DVH of the following structures: Target volume, spinal cord, trachea, larynx. This is medically necessary to adequately spare adjacent normal structures to the target region.   PLAN:  The patient will receive 35 Gy in 14 fractions.  Special treatment procedure The patient will also receive concurrent chemotherapy during the treatment. The patient may therefore experience increased toxicity or side effects and the patient will be monitored for such problems. This may require extra lab work as necessary. This therefore constitutes a special treatment  procedure.  ________________________________   Jodelle Gross, MD, PhD

## 2015-06-03 NOTE — Assessment & Plan Note (Signed)
Patient received cycle 3 of [place] docetaxel/Cyramza chemotherapy on 05/19/2015.  He also received Neulasta for growth factor support following this last cycle of chemotherapy.  He continues with daily radiation as well.  Patient had experienced a left forearm/hand extravasation with cycle 2 of his chemotherapy.  It appears the patient may very well have experienced another extravasation to the right forearm and hand.  Following this last cycle of chemotherapy as well.  See further notes for details.  Patient is also reporting some intermittent, mild nosebleeds at times.  Patient continues with oxygen on a 24/7 basis as his baseline.   blood counts obtained today reveal a WBC of 14.5, hemoglobin 11.3, platelet count 232.  Patient is scheduled to return for a restaging CT of his chest on 06/04/2015.  Patient is scheduled to return for labs, visit, and chemotherapy on 06/09/2015.

## 2015-06-03 NOTE — Discharge Instructions (Signed)
Implanted Port Insertion, Care After °Refer to this sheet in the next few weeks. These instructions provide you with information on caring for yourself after your procedure. Your health care provider may also give you more specific instructions. Your treatment has been planned according to current medical practices, but problems sometimes occur. Call your health care provider if you have any problems or questions after your procedure. °WHAT TO EXPECT AFTER THE PROCEDURE °After your procedure, it is typical to have the following:  °· Discomfort at the port insertion site. Ice packs to the area will help. °· Bruising on the skin over the port. This will subside in 3-4 days. °HOME CARE INSTRUCTIONS °· After your port is placed, you will get a manufacturer's information card. The card has information about your port. Keep this card with you at all times.   °· Know what kind of port you have. There are many types of ports available.   °· Wear a medical alert bracelet in case of an emergency. This can help alert health care workers that you have a port.   °· The port can stay in for as long as your health care provider believes it is necessary.   °· A home health care nurse may give medicines and take care of the port.   °· You or a family member can get special training and directions for giving medicine and taking care of the port at home.   °SEEK MEDICAL CARE IF:  °· Your port does not flush or you are unable to get a blood return.   °· You have a fever or chills. °SEEK IMMEDIATE MEDICAL CARE IF: °· You have new fluid or pus coming from your incision.   °· You notice a bad smell coming from your incision site.   °· You have swelling, pain, or more redness at the incision or port site.   °· You have chest pain or shortness of breath. °  °This information is not intended to replace advice given to you by your health care provider. Make sure you discuss any questions you have with your health care provider. °  °Document  Released: 04/18/2013 Document Revised: 07/03/2013 Document Reviewed: 04/18/2013 °[employer] Interactive Patient Education ©2016 [employer] °Implanted Port Home Guide °An implanted port is a type of central line that is placed under the skin. Central lines are used to provide IV access when treatment or nutrition needs to be given through a person's veins. Implanted ports are used for long-term IV access. An implanted port may be placed because:  °· You need IV medicine that would be irritating to the small veins in your hands or arms.   °· You need long-term IV medicines, such as antibiotics.   °· You need IV nutrition for a long period.   °· You need frequent blood draws for lab tests.   °· You need dialysis.   °Implanted ports are usually placed in the chest area, but they can also be placed in the upper arm, the abdomen, or the leg. An implanted port has two main parts:  °· Reservoir. The reservoir is round and will appear as a small, raised area under your skin. The reservoir is the part where a needle is inserted to give medicines or draw blood.   °· Catheter. The catheter is a thin, flexible tube that extends from the reservoir. The catheter is placed into a large vein. Medicine that is inserted into the reservoir goes into the catheter and then into the vein.   °HOW WILL I CARE FOR MY INCISION SITE? °Do not get the   incision site wet. Bathe or shower as directed by your health care provider.  °HOW IS MY PORT ACCESSED? °Special steps must be taken to access the port:  °· Before the port is accessed, a numbing cream can be placed on the skin. This helps numb the skin over the port site.   °· Your health care provider uses a sterile technique to access the port. °· Your health care provider must put on a mask and sterile gloves. °· The skin over your port is cleaned carefully with an antiseptic and allowed to dry. °· The port is gently pinched between sterile gloves, and a needle is inserted into the  port. °· Only "non-coring" port needles should be used to access the port. Once the port is accessed, a blood return should be checked. This helps ensure that the port is in the vein and is not clogged.   °· If your port needs to remain accessed for a constant infusion, a clear (transparent) bandage will be placed over the needle site. The bandage and needle will need to be changed every week, or as directed by your health care provider.   °· Keep the bandage covering the needle clean and dry. Do not get it wet. Follow your health care provider's instructions on how to take a shower or bath while the port is accessed.   °· If your port does not need to stay accessed, no bandage is needed over the port.   °WHAT IS FLUSHING? °Flushing helps keep the port from getting clogged. Follow your health care provider's instructions on how and when to flush the port. Ports are usually flushed with saline solution or a medicine called heparin. The need for flushing will depend on how the port is used.  °· If the port is used for intermittent medicines or blood draws, the port will need to be flushed:   °· After medicines have been given.   °· After blood has been drawn.   °· As part of routine maintenance.   °· If a constant infusion is running, the port may not need to be flushed.   °HOW LONG WILL MY PORT STAY IMPLANTED? °The port can stay in for as long as your health care provider thinks it is needed. When it is time for the port to come out, surgery will be done to remove it. The procedure is similar to the one performed when the port was put in.  °WHEN SHOULD I SEEK IMMEDIATE MEDICAL CARE? °When you have an implanted port, you should seek immediate medical care if:  °· You notice a bad smell coming from the incision site.   °· You have swelling, redness, or drainage at the incision site.   °· You have more swelling or pain at the port site or the surrounding area.   °· You have a fever that is not controlled with  medicine. °  °This information is not intended to replace advice given to you by your health care provider. Make sure you discuss any questions you have with your health care provider. °  °Document Released: 06/28/2005 Document Revised: 04/18/2013 Document Reviewed: 03/05/2013 °[employer] Interactive Patient Education ©2016 [employer] °Moderate Conscious Sedation, Adult °Sedation is the use of medicines to promote relaxation and relieve discomfort and anxiety. Moderate conscious sedation is a type of sedation. Under moderate conscious sedation you are less alert than normal but are still able to respond to instructions or stimulation. Moderate conscious sedation is used during short medical and dental procedures. It is milder than deep sedation or general anesthesia and   allows you to return to your regular activities sooner. °LET YOUR HEALTH CARE PROVIDER KNOW ABOUT:  °· Any allergies you have. °· All medicines you are taking, including vitamins, herbs, eye drops, creams, and over-the-counter medicines. °· Use of steroids (by mouth or creams). °· Previous problems you or members of your family have had with the use of anesthetics. °· Any blood disorders you have. °· Previous surgeries you have had. °· Medical conditions you have. °· Possibility of pregnancy, if this applies. °· Use of cigarettes, alcohol, or illegal drugs. °RISKS AND COMPLICATIONS °Generally, this is a safe procedure. However, as with any procedure, problems can occur. Possible problems include: °· Oversedation. °· Trouble breathing on your own. You may need to have a breathing tube until you are awake and breathing on your own. °· Allergic reaction to any of the medicines used for the procedure. °BEFORE THE PROCEDURE °· You may have blood tests done. These tests can help show how well your kidneys and liver are working. They can also show how well your blood clots. °· A physical exam will be done.   °· Only take medicines as directed by your  health care provider. You may need to stop taking medicines (such as blood thinners, aspirin, or nonsteroidal anti-inflammatory drugs) before the procedure.   °· Do not eat or drink at least 6 hours before the procedure or as directed by your health care provider. °· Arrange for a responsible adult, family member, or friend to take you home after the procedure. He or she should stay with you for at least 24 hours after the procedure, until the medicine has worn off. °PROCEDURE  °· An intravenous (IV) catheter will be inserted into one of your veins. Medicine will be able to flow directly into your body through this catheter. You may be given medicine through this tube to help prevent pain and help you relax. °· The medical or dental procedure will be done. °AFTER THE PROCEDURE °· You will stay in a recovery area until the medicine has worn off. Your blood pressure and pulse will be checked.   °·  Depending on the procedure you had, you may be allowed to go home when you can tolerate liquids and your pain is under control. °  °This information is not intended to replace advice given to you by your health care provider. Make sure you discuss any questions you have with your health care provider. °  °Document Released: 03/23/2001 Document Revised: 07/19/2014 Document Reviewed: 03/05/2013 °[employer] Interactive Patient Education ©2016 [employer] °Moderate Conscious Sedation, Adult, Care After °Refer to this sheet in the next few weeks. These instructions provide you with information on caring for yourself after your procedure. Your health care provider may also give you more specific instructions. Your treatment has been planned according to current medical practices, but problems sometimes occur. Call your health care provider if you have any problems or questions after your procedure. °WHAT TO EXPECT AFTER THE PROCEDURE  °After your procedure: °· You may feel sleepy, clumsy, and have poor balance for several  hours. °· Vomiting may occur if you eat too soon after the procedure. °HOME CARE INSTRUCTIONS °· Do not participate in any activities where you could become injured for at least 24 hours. Do not: °¨ Drive. °¨ Swim. °¨ Ride a bicycle. °¨ Operate heavy machinery. °¨ Cook. °¨ Use power tools. °¨ Climb ladders. °¨ Work from a high place. °· Do not make important decisions or sign legal documents until you are improved. °·   If you vomit, drink water, juice, or soup when you can drink without vomiting. Make sure you have little or no nausea before eating solid foods. °· Only take over-the-counter or prescription medicines for pain, discomfort, or fever as directed by your health care provider. °· Make sure you and your family fully understand everything about the medicines given to you, including what side effects may occur. °· You should not drink alcohol, take sleeping pills, or take medicines that cause drowsiness for at least 24 hours. °· If you smoke, do not smoke without supervision. °· If you are feeling better, you may resume normal activities 24 hours after you were sedated. °· Keep all appointments with your health care provider. °SEEK MEDICAL CARE IF: °· Your skin is pale or bluish in color. °· You continue to feel nauseous or vomit. °· Your pain is getting worse and is not helped by medicine. °· You have bleeding or swelling. °· You are still sleepy or feeling clumsy after 24 hours. °SEEK IMMEDIATE MEDICAL CARE IF: °· You develop a rash. °· You have difficulty breathing. °· You develop any type of allergic problem. °· You have a fever. °MAKE SURE YOU: °· Understand these instructions. °· Will watch your condition. °· Will get help right away if you are not doing well or get worse. °  °This information is not intended to replace advice given to you by your health care provider. Make sure you discuss any questions you have with your health care provider. °  °Document Released: 04/18/2013 Document Revised:  07/19/2014 Document Reviewed: 04/18/2013 °[employer] Interactive Patient Education ©2016 [employer] ° °

## 2015-06-03 NOTE — Progress Notes (Signed)
SYMPTOM MANAGEMENT CLINIC   HPI: [name] 74 y.o. male diagnosed with lung cancer; with brain metastasis.Currently undergoing Taxotere/Cyramza chemotherapy regimen.  Patient is also receiving radiation treatments on a daily basis.  Patient received cycle 3 of his docetaxel/Cyramza chemotherapy on 05/19/2015.  He also received Neulasta for growth factor support following this last cycle of chemotherapy.  He continues with daily radiation as well.   Patient had experienced a left forearm/hand extravasation with cycle 2 of his chemotherapy.  It appears the patient may very well have experienced another extravasation to the right forearm and hand following this last cycle of chemotherapy as well.   patient in today for recheck of his right forearm.  Previous extravasation site.  Also, patient underwent a right upper chest Port-A-Cath insertion earlier today as well.  He states that the Port-A-Cath was placed directly at his right upper chest radiation site; and Dr. Lisbeth Renshaw radiation oncologist held his radiation treatment today due to this fact.  HPI  ROS  Past Medical History  Diagnosis Date  . Arthritis   . Wheezing   . Sore throat   . Carotid artery occlusion   . COPD (chronic obstructive pulmonary disease) ([place])   . Lumbar disc disease   . Restless leg syndrome   . Allergic rhinitis   . Hypertension   . Anxiety     anxiety  . Shortness of breath   . Lung mass   . GERD (gastroesophageal reflux disease)   . H/O hiatal hernia   . Pre-operative cardiovascular examination   . Nonspecific abnormal unspecified cardiovascular function study   . Peripheral vascular disease, unspecified ([place])   . Pneumonia 2014    ?   Marland Kitchen On home oxygen therapy     prn  . Constipation   . Cancer ([place]) 09/14/12    invasive mod diff squamous cell ca  . 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 S/P radiation therapy 12/04/14 srs    lt frontal brain    Past Surgical History    Procedure Laterality Date  . Carotid endarterectomy  10/15/2010    left  . Hand surgery      repair of the left long, ring, and small finger after a saw injury  . Video bronchoscopy  07/24/2012    Procedure: VIDEO BRONCHOSCOPY WITH FLUORO;  Surgeon: Kathee Delton, MD;  Location: [facility];  Service: Cardiopulmonary;  Laterality: Bilateral;  . Lobectomy Right 09/14/2012    Procedure: LOBECTOMY;  Surgeon: Ivin Poot, MD;  Location: [place];  Service: Thoracic;  Laterality: Right;  RIGHT UPPER LOBECTOMY  . Video assisted thoracoscopy Right 09/14/2012    Procedure: VIDEO ASSISTED THORACOSCOPY;  Surgeon: Ivin Poot, MD;  Location: [place];  Service: Thoracic;  Laterality: Right;  . Video bronchoscopy with endobronchial ultrasound N/A 08/27/2014    Procedure: VIDEO BRONCHOSCOPY WITH ENDOBRONCHIAL ULTRASOUND;  Surgeon: Ivin Poot, MD;  Location: [facility] OR;  Service: Thoracic;  Laterality: N/A;  . Scalene node biopsy Right 08/27/2014    Procedure: BIOPSY SCALENE NODE;  Surgeon: Ivin Poot, MD;  Location: [facility] OR;  Service: Thoracic;  Laterality: Right;  . Sterotactic radiosurgery Right 09/25/14    right parietal 30Gy/1 fx    has Occlusion and stenosis of carotid artery without mention of cerebral infarction; COPD (chronic obstructive pulmonary disease) ([place]); Lung mass; Pre-operative cardiovascular examination; Nonspecific abnormal unspecified cardiovascular function study; Peripheral vascular disease, unspecified ([place]); Lung cancer ([place]); Pain in  limb; Coronary atherosclerosis of native coronary artery; Essential hypertension, benign; Tobacco use disorder; Carotid stenosis; Aftercare following surgery of the circulatory system; Weakness-Hand and  Legs; COPD exacerbation ([place]); Tachycardia; Dyspnea; Brain metastasis ([place]); Chemotherapy induced neutropenia ([place]); Renal stone; Pyelonephritis; Dehydration; UTI (lower urinary tract infection); Left arm cellulitis; Blood poisoning ([place]); Sepsis ([place]);  Encounter for antineoplastic immunotherapy; Cancer associated pain; Rash; Syncope; Constipation; Insomnia; Hypokalemia; Peripheral edema; Extravasation, infusion or chemotherapeutic agent; Epistaxis; Metastatic lung cancer (metastasis from lung to other site) [facility]); Poor venous access; and 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    has No Known Allergies.    Medication List       This list is accurate as of: 06/03/15  6:34 PM.  Always use your most recent med list.               amLODipine 10 MG tablet  Commonly known as:  NORVASC  Takes half a tablet     aspirin EC 81 MG tablet  Take 81 mg by mouth daily.     clonazePAM 0.5 MG tablet  Commonly known as:  KLONOPIN  Take 0.5 mg by mouth 2 (two) times daily as needed for anxiety. Takes one tablet by mouth two times daily as needed for anxiety.     furosemide 20 MG tablet  Commonly known as:  LASIX  Take 20 mg by mouth daily.     guaiFENesin 600 MG 12 hr tablet  Commonly known as:  MUCINEX  Take 1,200 mg by mouth 2 (two) times daily.     HYDROcodone-acetaminophen 7.5-325 MG tablet  Commonly known as:  NORCO  Take 1 tablet by mouth every 6 (six) hours as needed for moderate pain.     ipratropium-albuterol 0.5-2.5 (3) MG/3ML Soln  Commonly known as:  DUONEB  Take 3 mLs by nebulization 4 (four) times daily.     isosorbide mononitrate 30 MG 24 hr tablet  Commonly known as:  IMDUR  Take 1 tablet (30 mg total) by mouth daily.     lisinopril 40 MG tablet  Commonly known as:  PRINIVIL,ZESTRIL  Take 40 mg by mouth daily.     loratadine 10 MG tablet  Commonly known as:  CLARITIN  Take 10 mg by mouth daily.     metoprolol tartrate 25 MG tablet  Commonly known as:  LOPRESSOR  Take 25 mg by mouth 2 (two) times daily.     omeprazole 40 MG capsule  Commonly known as:  PRILOSEC  Take 40 mg by mouth daily.     potassium chloride SA 20 MEQ tablet  Commonly known as:  K-DUR,KLOR-CON  Take 1 tablet  (20 mEq total) by mouth daily.     pramipexole 1 MG tablet  Commonly known as:  MIRAPEX  Take 1 mg by mouth at bedtime.     SONAFINE  Apply 1 application topically daily.     traMADol 50 MG tablet  Commonly known as:  ULTRAM  Take 1 tablet (50 mg total) by mouth every 12 (twelve) hours as needed.     zolpidem 10 MG tablet  Commonly known as:  AMBIEN  Take 10 mg by mouth at bedtime as needed for sleep.         PHYSICAL EXAMINATION  Oncology Vitals 06/03/2015 06/03/2015 06/03/2015 06/03/2015 06/03/2015 06/03/2015 06/03/2015  Height - - - - - - -  Weight 81.693 kg - - - - - -  Weight (lbs) 180 lbs 2 oz - - - - - -  BMI (kg/m2) - - - - - - -  Temp 98.1 98.1 - 97.7 - - 97.7  Pulse 100 84 95 - 85 84 83  Resp '22 22 22 '$ - '22 24 22  '$ SpO2 99 - 100 - 100 100 98  BSA (m2) - - - - - - -   BP Readings from Last 3 Encounters:  06/03/15 155/75  06/03/15 152/71  05/30/15 159/78    Physical Exam  Constitutional: He is oriented to person, place, and time.  Patient appears fatigued, weak, and chronically ill.  HENT:  Head: Normocephalic and atraumatic.  Mouth/Throat: Oropharynx is clear and moist. No oropharyngeal exudate.  Eyes: Conjunctivae and EOM are normal. Pupils are equal, round, and reactive to light. Right eye exhibits no discharge. Left eye exhibits no discharge. No scleral icterus.  Neck: Normal range of motion.  Pulmonary/Chest: Effort normal. No respiratory distress.  Patient on home O2 via nasal cannula at 2 L as baseline.   right upper chest Port-A-Cath intact.  Musculoskeletal: Normal range of motion. He exhibits no edema or tenderness.  Neurological: He is alert and oriented to person, place, and time. Gait normal.  Skin: Skin is warm and dry. Rash noted. There is erythema. No pallor.  Patient's previous left forearm extravasation site continues to slowly resolve.   right forearm area continues to improve; with some dried skin flaking off at site.  There is no new  erythema, edema, warmth, or tenderness to the site.  Psychiatric: Affect normal.  Nursing note and vitals reviewed.   LABORATORY DATA:. Hospital Outpatient Visit on 06/03/2015  Component Date Value Ref Range Status  . aPTT 06/03/2015 31  24 - 37 seconds Final  . WBC 06/03/2015 14.5* 4.0 - 10.5 K/uL Final  . RBC 06/03/2015 3.70* 4.22 - 5.81 MIL/uL Final  . Hemoglobin 06/03/2015 11.3* 13.0 - 17.0 g/dL Final  . HCT 06/03/2015 36.0* 39.0 - 52.0 % Final  . MCV 06/03/2015 97.3  78.0 - 100.0 fL Final  . MCH 06/03/2015 30.5  26.0 - 34.0 pg Final  . MCHC 06/03/2015 31.4  30.0 - 36.0 g/dL Final  . RDW 06/03/2015 17.1* 11.5 - 15.5 % Final  . Platelets 06/03/2015 232  150 - 400 K/uL Final  . Neutrophils Relative % 06/03/2015 74   Final  . Lymphocytes Relative 06/03/2015 16   Final  . Monocytes Relative 06/03/2015 10   Final  . Eosinophils Relative 06/03/2015 0   Final  . Basophils Relative 06/03/2015 0   Final  . Neutro Abs 06/03/2015 10.7* 1.7 - 7.7 K/uL Final  . Lymphs Abs 06/03/2015 2.3  0.7 - 4.0 K/uL Final  . Monocytes Absolute 06/03/2015 1.5* 0.1 - 1.0 K/uL Final  . Eosinophils Absolute 06/03/2015 0.0  0.0 - 0.7 K/uL Final  . Basophils Absolute 06/03/2015 0.0  0.0 - 0.1 K/uL Final  . Smear Review 06/03/2015 MORPHOLOGY UNREMARKABLE   Final  . Prothrombin Time 06/03/2015 13.3  11.6 - 15.2 seconds Final  . INR 06/03/2015 0.99  0.00 - 1.49 Final   RADIOGRAPHIC STUDIES: Ir Fluoro Guide Cv Line Right  06/03/2015  CLINICAL DATA:  74 year old male with a history of metastatic lung cancer. EXAM: IR RIGHT FLOURO GUIDE CV LINE; IR ULTRASOUND GUIDANCE VASC ACCESS RIGHT Date: 06/03/2015 ANESTHESIA/SEDATION: Moderate (conscious) sedation was administered during this procedure. A total of 1.5 mg Versed and 50 mg Fentanyl were administered intravenously. [name] vital signs were monitored continuously by radiology nursing throughout the course of the procedure. Total  sedation time: 17 minutes  FLUOROSCOPY TIME:  1 minutes 6 seconds TECHNIQUE: The right neck and chest was prepped with chlorhexidine, and draped in the usual sterile fashion using maximum barrier technique (cap and mask, sterile gown, sterile gloves, large sterile sheet, hand hygiene and cutaneous antiseptic). Antibiotic prophylaxis was provided with 2.0g Ancef administered IV one hour prior to skin incision. Local anesthesia was attained by infiltration with 1% lidocaine without epinephrine. Ultrasound demonstrated patency of the right internal jugular vein, and this was documented with an image. Under real-time ultrasound guidance, this vein was accessed with a 21 gauge micropuncture needle and image documentation was performed. A small dermatotomy was made at the access site with an 11 scalpel. A 0.018" wire was advanced into the SVC and the access needle exchanged for a 65F micropuncture vascular sheath. The 0.018" wire was then removed and a 0.035" wire advanced into the IVC. An appropriate location for the subcutaneous reservoir was selected below the clavicle and an incision was made through the skin and underlying soft tissues. The subcutaneous tissues were then dissected using a combination of blunt and sharp surgical technique and a pocket was formed. A single lumen power injectable portacatheter was then tunneled through the subcutaneous tissues from the pocket to the dermatotomy and the port reservoir placed within the subcutaneous pocket. The venous access site was then serially dilated and a peel away vascular sheath placed over the wire. The wire was removed and the port catheter advanced into position under fluoroscopic guidance. The catheter tip is positioned in the upper right atrium. This was documented with a spot image. The portacatheter was then tested and found to flush and aspirate well. The port was flushed with saline followed by 100 units/mL heparinized saline. The pocket was then closed in two layers using first  subdermal inverted interrupted absorbable sutures followed by a running subcuticular suture. The epidermis was then sealed with Dermabond. The dermatotomy at the venous access site was also closed with a single inverted subdermal suture and the epidermis sealed with Dermabond. COMPLICATIONS: None IMPRESSION: Status post placement of right IJ approach port catheter. Catheter ready for use. Signed, Dulcy Fanny. Earleen Newport, DO [facility] Radiology Electronically Signed   By: Corrie Mckusick D.O.   On: 06/03/2015 10:57   Ir US Guide Vasc Access Right  06/03/2015  CLINICAL DATA:  74 year old male with a history of metastatic lung cancer. EXAM: IR RIGHT FLOURO GUIDE CV LINE; IR ULTRASOUND GUIDANCE VASC ACCESS RIGHT Date: 06/03/2015 ANESTHESIA/SEDATION: Moderate (conscious) sedation was administered during this procedure. A total of 1.5 mg Versed and 50 mg Fentanyl were administered intravenously. [name] vital signs were monitored continuously by radiology nursing throughout the course of the procedure. Total sedation time: 17 minutes FLUOROSCOPY TIME:  1 minutes 6 seconds TECHNIQUE: The right neck and chest was prepped with chlorhexidine, and draped in the usual sterile fashion using maximum barrier technique (cap and mask, sterile gown, sterile gloves, large sterile sheet, hand hygiene and cutaneous antiseptic). Antibiotic prophylaxis was provided with 2.0g Ancef administered IV one hour prior to skin incision. Local anesthesia was attained by infiltration with 1% lidocaine without epinephrine. Ultrasound demonstrated patency of the right internal jugular vein, and this was documented with an image. Under real-time ultrasound guidance, this vein was accessed with a 21 gauge micropuncture needle and image documentation was performed. A small dermatotomy was made at the access site with an 11 scalpel. A 0.018" wire was advanced into the SVC and  the access needle exchanged  for a 41F micropuncture vascular sheath. The 0.018" wire was then removed and a 0.035" wire advanced into the IVC. An appropriate location for the subcutaneous reservoir was selected below the clavicle and an incision was made through the skin and underlying soft tissues. The subcutaneous tissues were then dissected using a combination of blunt and sharp surgical technique and a pocket was formed. A single lumen power injectable portacatheter was then tunneled through the subcutaneous tissues from the pocket to the dermatotomy and the port reservoir placed within the subcutaneous pocket. The venous access site was then serially dilated and a peel away vascular sheath placed over the wire. The wire was removed and the port catheter advanced into position under fluoroscopic guidance. The catheter tip is positioned in the upper right atrium. This was documented with a spot image. The portacatheter was then tested and found to flush and aspirate well. The port was flushed with saline followed by 100 units/mL heparinized saline. The pocket was then closed in two layers using first subdermal inverted interrupted absorbable sutures followed by a running subcuticular suture. The epidermis was then sealed with Dermabond. The dermatotomy at the venous access site was also closed with a single inverted subdermal suture and the epidermis sealed with Dermabond. COMPLICATIONS: None IMPRESSION: Status post placement of right IJ approach port catheter. Catheter ready for use. Signed, Dulcy Fanny. Earleen Newport, DO [facility] Radiology Electronically Signed   By: Corrie Mckusick D.O.   On: 06/03/2015 10:57    ASSESSMENT/PLAN:    Lung cancer Patient received cycle 3 of [place] docetaxel/Cyramza chemotherapy on 05/19/2015.  He also received Neulasta for growth factor support following this last cycle of chemotherapy.  He continues with daily radiation as well.  Patient had experienced a left  forearm/hand extravasation with cycle 2 of his chemotherapy.  It appears the patient may very well have experienced another extravasation to the right forearm and hand.  Following this last cycle of chemotherapy as well.  See further notes for details.  Patient is also reporting some intermittent, mild nosebleeds at times.  Patient continues with oxygen on a 24/7 basis as his baseline.   blood counts obtained today reveal a WBC of 14.5, hemoglobin 11.3, platelet count 232.  Patient is scheduled to return for a restaging CT of his chest on 06/04/2015.  Patient is scheduled to return for labs, visit, and chemotherapy on 06/09/2015.    Extravasation, infusion or chemotherapeutic agent Patient received cycle 3 of [place] docetaxel/Cyramza chemotherapy on 05/19/2015.  He also received Neulasta for growth factor support following this last cycle of chemotherapy.  He continues with daily radiation as well.   Patient had experienced a left forearm/hand extravasation with cycle 2 of his chemotherapy.  It appears the patient may very well have experienced another extravasation to the right forearm and hand following this last cycle of chemotherapy as well.    Patient in for recheck of his right forearm previous extravasation site today.  He states that he has been picking at [place] off the dry skin over the resolving site.   on exam.-Patient's right forearm does appear to be slowly resolving.  There is new /healing tissue underneath the dry patches of skin.  There is no evidence of new infection to the site.  There is no increased erythema or warmth to the site.  At this time.  Also, there is no edema.  Patient was advised to continue with cool/moist compresses to  the site and to elevate his arm above the level of his heart whenever possible.  He may also take small amount of ibuprofen to help decrease to the inflammation as well.   patient was advised to discontinue picking at the healing area.   Of  note- patient just underwent a right upper chest Port-A-Cath insertion earlier today for better chemotherapy access in the future.  However, patient reports that the Port-A-Cath was placed directly at the site of his radiation treatments. Patient reports that Dr. Lisbeth Renshaw held his  Radiation treatments today ; and will review options for his continued treatment.  Dr. Lisbeth Renshaw has plans to review his new plan with the patient and his wife tomorrow.      Epistaxis  Patient continues with occasional, mild nosebleeds ; despite platelet count stable.    have previously advised patient that he may very well be experiencing some dryness in his nose secondary to his continual 24/7 oxygen via nasal cannula.  Advised patient to try the Aquaphor to The inside of the snares to see if this helps.   Patient stated understanding of all instructions; and was in agreement with this plan of care. The patient knows to call the clinic with any problems, questions or concerns.   Review/collaboration with [name] regarding all aspects of patient's visit today.   Total time spent with patient was 15 minutes;  with greater than 75 percent of that time spent in face to face counseling regarding patient's symptoms,  and coordination of care and follow up.  Disclaimer:This dictation was prepared with Dragon/digital dictation along with [employer]. Any transcriptional errors that result from this process are unintentional.  Drue Second, NP 06/03/2015

## 2015-06-03 NOTE — Assessment & Plan Note (Signed)
Patient received cycle 3 of [place] docetaxel/Cyramza chemotherapy on 05/19/2015.  He also received Neulasta for growth factor support following this last cycle of chemotherapy.  He continues with daily radiation as well.   Patient had experienced a left forearm/hand extravasation with cycle 2 of his chemotherapy.  It appears the patient may very well have experienced another extravasation to the right forearm and hand following this last cycle of chemotherapy as well.    Patient in for recheck of his right forearm previous extravasation site today.  He states that he has been picking at [place] off the dry skin over the resolving site.   on exam.-Patient's right forearm does appear to be slowly resolving.  There is new /healing tissue underneath the dry patches of skin.  There is no evidence of new infection to the site.  There is no increased erythema or warmth to the site.  At this time.  Also, there is no edema.  Patient was advised to continue with cool/moist compresses to the site and to elevate his arm above the level of his heart whenever possible.  He may also take small amount of ibuprofen to help decrease to the inflammation as well.   patient was advised to discontinue picking at the healing area.   Of note- patient just underwent a right upper chest Port-A-Cath insertion earlier today for better chemotherapy access in the future.  However, patient reports that the Port-A-Cath was placed directly at the site of his radiation treatments. Patient reports that Dr. Lisbeth Renshaw held his  Radiation treatments today ; and will review options for his continued treatment.  Dr. Lisbeth Renshaw has plans to review his new plan with the patient and his wife tomorrow.

## 2015-06-03 NOTE — Progress Notes (Signed)
Pt returned from IR post port insertion. Pt had albuterol neb tx prior to going to IR for his Port placement.Pt now has very loose productive cough clear to whitish secreations

## 2015-06-03 NOTE — H&P (Signed)
Chief Complaint: Patient was seen in consultation today for port a cath placement  Referring Physician(s): Mohamed,Mohamed  History of Present Illness: [name] is a 74 y.o. male with history of metastatic lung cancer, poor venous access who presents today for port a cath placement for chemotherapy.   Past Medical History  Diagnosis Date  . Arthritis   . Wheezing   . Sore throat   . Carotid artery occlusion   . COPD (chronic obstructive pulmonary disease) ([place])   . Lumbar disc disease   . Restless leg syndrome   . Allergic rhinitis   . Hypertension   . Anxiety     anxiety  . Shortness of breath   . Lung mass   . GERD (gastroesophageal reflux disease)   . H/O hiatal hernia   . Pre-operative cardiovascular examination   . Nonspecific abnormal unspecified cardiovascular function study   . Peripheral vascular disease, unspecified ([place])   . Pneumonia 2014    ?   Marland Kitchen On home oxygen therapy     prn  . Constipation   . Cancer ([place]) 09/14/12    invasive mod diff squamous cell ca  . 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 S/P radiation therapy 12/04/14 srs    lt frontal brain    Past Surgical History  Procedure Laterality Date  . Carotid endarterectomy  10/15/2010    left  . Hand surgery      repair of the left long, ring, and small finger after a saw injury  . Video bronchoscopy  07/24/2012    Procedure: VIDEO BRONCHOSCOPY WITH FLUORO;  Surgeon: Kathee Delton, MD;  Location: [facility];  Service: Cardiopulmonary;  Laterality: Bilateral;  . Lobectomy Right 09/14/2012    Procedure: LOBECTOMY;  Surgeon: Ivin Poot, MD;  Location: [facility];  Service: Thoracic;  Laterality: Right;  RIGHT UPPER LOBECTOMY  . Video assisted thoracoscopy Right 09/14/2012    Procedure: VIDEO ASSISTED THORACOSCOPY;  Surgeon: Ivin Poot, MD;  Location: [facility];  Service: Thoracic;  Laterality: Right;  . Video bronchoscopy with endobronchial ultrasound N/A 08/27/2014   Procedure: VIDEO BRONCHOSCOPY WITH ENDOBRONCHIAL ULTRASOUND;  Surgeon: Ivin Poot, MD;  Location: [facility];  Service: Thoracic;  Laterality: N/A;  . Scalene node biopsy Right 08/27/2014    Procedure: BIOPSY SCALENE NODE;  Surgeon: Ivin Poot, MD;  Location: [facility];  Service: Thoracic;  Laterality: Right;  . Sterotactic radiosurgery Right 09/25/14    right parietal 30Gy/1 fx    Allergies: Review of patient's allergies indicates no known allergies.  Medications: Prior to Admission medications   Medication Sig Start Date End Date Taking? Authorizing Provider  amLODipine (NORVASC) 10 MG tablet Takes half a tablet 05/16/15  Yes Historical Provider, MD  clonazePAM (KLONOPIN) 0.5 MG tablet Take 0.5 mg by mouth 2 (two) times daily as needed for anxiety. Takes one tablet by mouth two times daily as needed for anxiety.   Yes Historical Provider, MD  furosemide (LASIX) 20 MG tablet Take 20 mg by mouth daily. 03/18/15  Yes Mark Hepler, PA-C  guaiFENesin (MUCINEX) 600 MG 12 hr tablet Take 1,200 mg by mouth 2 (two) times daily.   Yes Historical Provider, MD  HYDROcodone-acetaminophen (NORCO) 7.5-325 MG tablet Take 1 tablet by mouth every 6 (six) hours as needed for moderate pain. 04/14/15  Yes Susanne Borders, NP  ipratropium-albuterol (DUONEB) 0.5-2.5 (3) MG/3ML SOLN Take 3 mLs by nebulization 4 (four) times  daily. Patient taking differently: Take 3 mLs by nebulization 4 (four) times daily. Pt takes nebulizer 2 to 3 times daily. 12/18/14  Yes Noralee Space, MD  lisinopril (PRINIVIL,ZESTRIL) 40 MG tablet Take 40 mg by mouth daily. 04/23/15  Yes Historical Provider, MD  loratadine (CLARITIN) 10 MG tablet Take 10 mg by mouth daily.   Yes Historical Provider, MD  metoprolol tartrate (LOPRESSOR) 25 MG tablet Take 25 mg by mouth 2 (two) times daily.  10/22/14  Yes Historical Provider, MD  pramipexole (MIRAPEX) 1 MG tablet Take 1 mg by mouth at bedtime.   Yes Historical Provider, MD  aspirin EC 81 MG tablet Take 81  mg by mouth daily.     Historical Provider, MD  isosorbide mononitrate (IMDUR) 30 MG 24 hr tablet Take 1 tablet (30 mg total) by mouth daily. 02/14/15   Jettie Booze, MD  omeprazole (PRILOSEC) 40 MG capsule Take 40 mg by mouth daily.    Historical Provider, MD  potassium chloride SA (K-DUR,KLOR-CON) 20 MEQ tablet Take 1 tablet (20 mEq total) by mouth daily. 04/02/15   Susanne Borders, NP  traMADol (ULTRAM) 50 MG tablet Take 1 tablet (50 mg total) by mouth every 12 (twelve) hours as needed. 01/29/15   Laurie Panda, NP  Wound Dressings (SONAFINE) Apply 1 application topically daily. 05/28/15   Kyung Rudd, MD  zolpidem (AMBIEN) 10 MG tablet Take 10 mg by mouth at bedtime as needed for sleep.    Historical Provider, MD     Family History  Problem Relation Age of Onset  . Heart disease Father     MI  . Heart attack Father   . Alcohol abuse Mother   . Stroke Brother   . Heart disease Brother   . Depression Sister   . Heart attack Brother     Social History   Social History  . Marital Status: Married    Spouse Name: N/A  . Number of Children: Y  . Years of Education: N/A   Occupational History  . works in Education administrator    Social History Main Topics  . Smoking status: Former Smoker -- 1.00 packs/day for 55 years    Types: Cigarettes    Quit date: 08/16/2014  . Smokeless tobacco: Former Systems developer    Quit date: 04/19/1952  . Alcohol Use: Yes     Comment: occianional- not since I ve sick.  . Drug Use: No  . Sexual Activity: Not Asked   Other Topics Concern  . None   Social History Narrative      Review of Systems  Constitutional: Negative for fever and chills.  HENT: Positive for nosebleeds.   Respiratory: Positive for cough and shortness of breath.   Cardiovascular: Positive for leg swelling. Negative for chest pain.  Gastrointestinal: Negative for abdominal pain and blood in stool.       Occ nausea/vomiting  Genitourinary: Negative for dysuria and hematuria.    Musculoskeletal: Positive for back pain.  Neurological: Positive for headaches.  Hematological: Bruises/bleeds easily.    Vital Signs: BP 113/83 mmHg  Pulse 95  Temp(Src) 97.7 F (36.5 C) (Oral)  Resp 18  Ht 5' 9.5" (1.765 m)  Wt 180 lb (81.647 kg)  BMI 26.21 kg/m2  SpO2 98%  Physical Exam  Constitutional: He is oriented to person, place, and time. He appears well-developed and well-nourished.  Cardiovascular: Normal rate and regular rhythm.   Pulmonary/Chest: Effort normal.  Distant BS bilat with few exp wheezes  Abdominal:  Soft. Bowel sounds are normal. There is no tenderness.  protuberant  Musculoskeletal: Normal range of motion. He exhibits edema.  Rt forearm with erythema, tr edema from prior chemo extravasation  Neurological: He is alert and oriented to person, place, and time.    Mallampati Score:     Imaging: No results found.  Labs:  CBC:  Recent Labs  04/28/15 0838 05/19/15 1143 05/30/15 1012 06/03/15 0745  WBC 6.3 6.2 31.0* 14.5*  HGB 12.0* 11.4* 10.8* 11.3*  HCT 36.7* 34.5* 33.3* 36.0*  PLT 445* 273 243 232    COAGS:  Recent Labs  08/27/14 1115 11/11/14 1442 06/03/15 0745  INR 0.99 1.10 0.99  APTT 30  --  31    BMP:  Recent Labs  11/12/14 0330 11/13/14 0501 11/14/14 0535  12/05/14 0618  04/07/15 1038 04/28/15 0839 05/19/15 1143 05/30/15 1012  NA 131* 132* 132*  < > 138  < > 141 139 134* 140  K 3.4* 3.7 3.5  < > 3.4*  < > 3.8 4.3 4.5 4.5  CL 100* 98* 96*  --  95*  --   --   --   --   --   CO2 '26 27 29  '$ < > 32  < > 32* '27 23 27  '$ GLUCOSE 110* 108* 114*  < > 97  < > 92 94 104 91  BUN 7 5* 5*  < > 9  < > 9.1 11.8 12.8 8.1  CALCIUM 8.0* 8.2* 8.3*  < > 9.2  < > 9.5 9.6 9.3 9.4  CREATININE 0.80 0.73 0.64  < > 0.52*  < > 0.8 0.9 1.0 1.1  GFRNONAA >60 >60 >60  --  >60  --   --   --   --   --   GFRAA >60 >60 >60  --  >60  --   --   --   --   --   < > = values in this interval not displayed.  LIVER FUNCTION TESTS:  Recent  Labs  04/07/15 1038 04/28/15 0839 05/19/15 1143 05/30/15 1012  BILITOT <0.30 0.32 0.32 <0.30  AST '16 14 17 22  '$ ALT '11 10 11 13  '$ ALKPHOS 90 89 77 145  PROT 7.8 7.3 6.5 6.8  ALBUMIN 3.8 3.4* 3.4* 3.5    TUMOR MARKERS: No results for input(s): AFPTM, CEA, CA199, CHROMGRNA in the last 8760 hours.  Assessment and Plan: [name] is a 74 y.o. male with history of metastatic lung cancer, poor venous access who presents today for port a cath placement for chemotherapy. Risks and benefits discussed with the patient/family including, but not limited to bleeding, infection, pneumothorax, or fibrin sheath development and need for additional procedures.All of the patient's questions were answered, patient is agreeable to proceed. Consent signed and in chart.     Thank you for this interesting consult.  I greatly enjoyed meeting DAISON BRAXTON and look forward to participating in their care.  A copy of this report was sent to the requesting provider on this date.  Signed: D. Rowe Robert 06/03/2015, 8:52 AM   I spent a total of 15 minutes in face to face in clinical consultation, greater than 50% of which was counseling/coordinating care for port a cath placement

## 2015-06-03 NOTE — Progress Notes (Signed)
[name] states that he feels as if he has a lump in his throat when he swallows. He denies any pain upon swallowing at this time.  Denies any pain.  BP 155/75 mmHg  Pulse 100  Temp(Src) 98.1 F (36.7 C)  Resp 22  Wt 180 lb 1.6 oz (81.693 kg)  SpO2 99%

## 2015-06-03 NOTE — Procedures (Signed)
Interventional Radiology Procedure Note  Procedure: Placement of a right IJ approach single lumen PowerPort.  Tip is positioned at the superior cavoatrial junction and catheter is ready for immediate use.  Complications: None Recommendations:  - Ok to shower tomorrow - Do not submerge for 7 days - Routine line care   Signed,  Elyse Prevo S. Braddock Servellon, DO   

## 2015-06-03 NOTE — Assessment & Plan Note (Signed)
Patient continues with occasional, mild nosebleeds ; despite platelet count stable.    have previously advised patient that he may very well be experiencing some dryness in his nose secondary to his continual 24/7 oxygen via nasal cannula.  Advised patient to try the Aquaphor to The inside of the snares to see if this helps.

## 2015-06-03 NOTE — Progress Notes (Signed)
Department of Radiation Oncology  Phone:  [phone] Fax:        [phone]  Weekly Treatment Note    Name: [name] Date: 06/03/2015 MRN: [id number] DOB: 12-06-1940   Current dose: 10 Gy  Current fraction: 4   MEDICATIONS: Current Outpatient Prescriptions  Medication Sig Dispense Refill  . amLODipine (NORVASC) 10 MG tablet Takes half a tablet    . aspirin EC 81 MG tablet Take 81 mg by mouth daily.     . clonazePAM (KLONOPIN) 0.5 MG tablet Take 0.5 mg by mouth 2 (two) times daily as needed for anxiety. Takes one tablet by mouth two times daily as needed for anxiety.    Marland Kitchen guaiFENesin (MUCINEX) 600 MG 12 hr tablet Take 1,200 mg by mouth 2 (two) times daily.    Marland Kitchen HYDROcodone-acetaminophen (NORCO) 7.5-325 MG tablet Take 1 tablet by mouth every 6 (six) hours as needed for moderate pain. 45 tablet 0  . ipratropium-albuterol (DUONEB) 0.5-2.5 (3) MG/3ML SOLN Take 3 mLs by nebulization 4 (four) times daily. (Patient taking differently: Take 3 mLs by nebulization 4 (four) times daily. Pt takes nebulizer 2 to 3 times daily.) 360 mL 2  . isosorbide mononitrate (IMDUR) 30 MG 24 hr tablet Take 1 tablet (30 mg total) by mouth daily. 30 tablet 3  . lisinopril (PRINIVIL,ZESTRIL) 40 MG tablet Take 40 mg by mouth daily.    Marland Kitchen loratadine (CLARITIN) 10 MG tablet Take 10 mg by mouth daily.    . metoprolol tartrate (LOPRESSOR) 25 MG tablet Take 25 mg by mouth 2 (two) times daily.     Marland Kitchen omeprazole (PRILOSEC) 40 MG capsule Take 40 mg by mouth daily.    . potassium chloride SA (K-DUR,KLOR-CON) 20 MEQ tablet Take 1 tablet (20 mEq total) by mouth daily. 30 tablet 1  . pramipexole (MIRAPEX) 1 MG tablet Take 1 mg by mouth at bedtime.    . traMADol (ULTRAM) 50 MG tablet Take 1 tablet (50 mg total) by mouth every 12 (twelve) hours as needed. 30 tablet 0  . Wound Dressings (SONAFINE) Apply 1 application topically daily.    Marland Kitchen zolpidem (AMBIEN) 10 MG tablet Take 10 mg by mouth at bedtime as needed for  sleep.    . furosemide (LASIX) 20 MG tablet Take 20 mg by mouth daily.     No current facility-administered medications for this encounter.   Facility-Administered Medications Ordered in Other Encounters  Medication Dose Route Frequency Provider Last Rate Last Dose  . 0.9 %  sodium chloride infusion   Intravenous Continuous Darrell K Allred, PA-C 50 mL/hr at 06/03/15 0750 500 mL at 06/03/15 0750  . lidocaine (XYLOCAINE) 1 % (with pres) injection           . sodium chloride 0.9 % injection 10 mL  10 mL Intracatheter PRN Curt Bears, MD   10 mL at 01/29/15 0944     ALLERGIES: Review of patient's allergies indicates no known allergies.   LABORATORY DATA:  Lab Results  Component Value Date   WBC 14.5* 06/03/2015   HGB 11.3* 06/03/2015   HCT 36.0* 06/03/2015   MCV 97.3 06/03/2015   PLT 232 06/03/2015   Lab Results  Component Value Date   NA 140 05/30/2015   K 4.5 05/30/2015   CL 95* 12/05/2014   CO2 27 05/30/2015   Lab Results  Component Value Date   ALT 13 05/30/2015   AST 22 05/30/2015   ALKPHOS 145 05/30/2015   BILITOT <0.30 05/30/2015  NARRATIVE: [name] was seen today for weekly treatment management. The chart was checked and the patient's films were reviewed.  [name] states that he feels as if he has a lump in his throat when he swallows. He denies any pain upon swallowing at this time.  Denies any pain.  BP 155/75 mmHg  Pulse 100  Temp(Src) 98.1 F (36.7 C)  Resp 22  Wt 180 lb 1.6 oz (81.693 kg)  SpO2 99%  PHYSICAL EXAMINATION: weight is 180 lb 1.6 oz (81.693 kg). His temperature is 98.1 F (36.7 C). His blood pressure is 155/75 and his pulse is 100. His respiration is 22 and oxygen saturation is 99%.        ASSESSMENT/plan: The patient is doing satisfactorily with treatment. The patient had his port placed today. However, there is overlap between the treatment area and the surgical incision. We were not able to proceed with treatment  today therefore. We will try to alter his plan to see if his target region can be treated effectively following voiding this area. If not, we will need to delay his treatment to allow wound healing.

## 2015-06-04 ENCOUNTER — Ambulatory Visit: Payer: Medicare Other

## 2015-06-04 ENCOUNTER — Ambulatory Visit
Admission: RE | Admit: 2015-06-04 | Discharge: 2015-06-04 | Disposition: A | Discharge status: Home / Self Care | Payer: Medicare Other | Source: Ambulatory Visit | Attending: Radiation Oncology | Admitting: Radiation Oncology

## 2015-06-04 ENCOUNTER — Ambulatory Visit (HOSPITAL_COMMUNITY)
Admission: RE | Admit: 2015-06-04 | Discharge: 2015-06-04 | Disposition: A | Discharge status: Home / Self Care | Payer: Medicare Other | Source: Ambulatory Visit | Attending: Nurse Practitioner | Admitting: Nurse Practitioner

## 2015-06-04 ENCOUNTER — Other Ambulatory Visit: Payer: Self-pay | Admitting: Nurse Practitioner

## 2015-06-04 DIAGNOSIS — C3492 Malignant neoplasm of unspecified part of left bronchus or lung: Secondary | ICD-10-CM | POA: Diagnosis not present

## 2015-06-04 DIAGNOSIS — C3490 Malignant neoplasm of unspecified part of unspecified bronchus or lung: Secondary | ICD-10-CM

## 2015-06-04 DIAGNOSIS — C770 Secondary and unspecified malignant neoplasm of lymph nodes of head, face and neck: Secondary | ICD-10-CM

## 2015-06-04 DIAGNOSIS — Z510 Encounter for antineoplastic radiation therapy: Secondary | ICD-10-CM | POA: Diagnosis not present

## 2015-06-04 MED ORDER — IOHEXOL 300 MG/ML  SOLN
100.0000 mL | Freq: Once | INTRAMUSCULAR | Status: AC | PRN
  Administered 2015-06-04: 100 mL via INTRAVENOUS

## 2015-06-04 NOTE — Progress Notes (Signed)
  Radiation Oncology         [phone] ________________________________  Name: [name] MRN: [id number]  Date: 06/04/2015  DOB: 04-Nov-1940  SIMULATION AND TREATMENT PLANNING NOTE  DIAGNOSIS:  Metastatic lung cancer  Site:  Right neck  NARRATIVE:  The patient was brought to the [facility].  The patient had a Port-A-Cath placed which overlaps with current treatment area. We therefore have brought the patient back for simulation once again to alter the radiation treatment fields to avoid this surgical area.   Identity was confirmed.  All relevant records and images related to the planned course of therapy were reviewed.   Written consent to proceed with treatment was confirmed which was freely given after reviewing the details related to the planned course of therapy had been reviewed with the patient.  Then, the patient was set-up in a stable reproducible  supine position for radiation therapy.  CT images were obtained.  Surface markings were placed.    The CT images were loaded into the planning software.  Then the target and avoidance structures were contoured.  Treatment planning then occurred.  The radiation prescription was entered and confirmed.  A total of 2 complex treatment devices were fabricated which relate to the designed radiation treatment fields. Each of these customized fields/ complex treatment devices will be used on a daily basis during the radiation course. I have requested : A complex isodose plan.   PLAN:  The patient will still receive 35 Gy in 14 fractions.  ________________________________   Jodelle Gross, MD, PhD  This document serves as a record of services personally performed by Kyung Rudd, MD. It was created on his behalf by Arlyce Harman, a trained medical scribe. The creation of this record is based on the scribe's personal observations and the provider's statements to them. This document has been checked and approved by the  attending provider.

## 2015-06-06 ENCOUNTER — Other Ambulatory Visit: Payer: Self-pay | Admitting: Medical Oncology

## 2015-06-06 DIAGNOSIS — C3490 Malignant neoplasm of unspecified part of unspecified bronchus or lung: Secondary | ICD-10-CM

## 2015-06-06 DIAGNOSIS — C3491 Malignant neoplasm of unspecified part of right bronchus or lung: Secondary | ICD-10-CM

## 2015-06-09 ENCOUNTER — Other Ambulatory Visit (HOSPITAL_BASED_OUTPATIENT_CLINIC_OR_DEPARTMENT_OTHER): Payer: Medicare Other

## 2015-06-09 ENCOUNTER — Encounter: Payer: Self-pay | Admitting: Internal Medicine

## 2015-06-09 ENCOUNTER — Telehealth: Payer: Self-pay | Admitting: Internal Medicine

## 2015-06-09 ENCOUNTER — Ambulatory Visit: Payer: Medicare Other

## 2015-06-09 ENCOUNTER — Ambulatory Visit (HOSPITAL_BASED_OUTPATIENT_CLINIC_OR_DEPARTMENT_OTHER): Payer: Medicare Other | Admitting: Internal Medicine

## 2015-06-09 ENCOUNTER — Ambulatory Visit
Admission: RE | Admit: 2015-06-09 | Discharge: 2015-06-09 | Disposition: A | Discharge status: Home / Self Care | Payer: Medicare Other | Source: Ambulatory Visit | Attending: Radiation Oncology | Admitting: Radiation Oncology

## 2015-06-09 VITALS — BP 156/68 | HR 112 | Temp 98.1°F | Resp 20 | Ht 69.5 in | Wt 182.9 lb

## 2015-06-09 DIAGNOSIS — Z510 Encounter for antineoplastic radiation therapy: Secondary | ICD-10-CM | POA: Diagnosis not present

## 2015-06-09 DIAGNOSIS — C3491 Malignant neoplasm of unspecified part of right bronchus or lung: Secondary | ICD-10-CM | POA: Diagnosis present

## 2015-06-09 DIAGNOSIS — C7931 Secondary malignant neoplasm of brain: Secondary | ICD-10-CM | POA: Diagnosis not present

## 2015-06-09 DIAGNOSIS — Z85118 Personal history of other malignant neoplasm of bronchus and lung: Secondary | ICD-10-CM

## 2015-06-09 DIAGNOSIS — J418 Mixed simple and mucopurulent chronic bronchitis: Secondary | ICD-10-CM

## 2015-06-09 LAB — CBC WITH DIFFERENTIAL/PLATELET
BASO%: 0.6 % (ref 0.0–2.0)
Basophils Absolute: 0.1 10*3/uL (ref 0.0–0.1)
EOS ABS: 0 10*3/uL (ref 0.0–0.5)
EOS%: 0 % (ref 0.0–7.0)
HEMATOCRIT: 34.4 % — AB (ref 38.4–49.9)
HGB: 10.9 g/dL — ABNORMAL LOW (ref 13.0–17.1)
LYMPH#: 2.2 10*3/uL (ref 0.9–3.3)
LYMPH%: 23.5 % (ref 14.0–49.0)
MCH: 30.7 pg (ref 27.2–33.4)
MCHC: 31.7 g/dL — AB (ref 32.0–36.0)
MCV: 96.9 fL (ref 79.3–98.0)
MONO#: 1 10*3/uL — AB (ref 0.1–0.9)
MONO%: 10.6 % (ref 0.0–14.0)
NEUT#: 6.2 10*3/uL (ref 1.5–6.5)
NEUT%: 65.3 % (ref 39.0–75.0)
PLATELETS: 255 10*3/uL (ref 140–400)
RBC: 3.55 10*6/uL — ABNORMAL LOW (ref 4.20–5.82)
RDW: 17.1 % — ABNORMAL HIGH (ref 11.0–14.6)
WBC: 9.5 10*3/uL (ref 4.0–10.3)

## 2015-06-09 LAB — COMPREHENSIVE METABOLIC PANEL (CC13)
ANION GAP: 7 meq/L (ref 3–11)
AST: 14 U/L (ref 5–34)
Albumin: 3.2 g/dL — ABNORMAL LOW (ref 3.5–5.0)
Alkaline Phosphatase: 66 U/L (ref 40–150)
BUN: 12.2 mg/dL (ref 7.0–26.0)
CALCIUM: 9.2 mg/dL (ref 8.4–10.4)
CO2: 27 mEq/L (ref 22–29)
CREATININE: 1 mg/dL (ref 0.7–1.3)
Chloride: 106 mEq/L (ref 98–109)
EGFR: 71 mL/min/{1.73_m2} — AB (ref >90)
Glucose: 92 mg/dl (ref 70–140)
Potassium: 4.5 mEq/L (ref 3.5–5.1)
Sodium: 140 mEq/L (ref 136–145)
TOTAL PROTEIN: 6.8 g/dL (ref 6.4–8.3)

## 2015-06-09 LAB — UA PROTEIN, DIPSTICK - CHCC: Protein, ur: NEGATIVE mg/dL

## 2015-06-09 MED ORDER — HYDROCODONE-ACETAMINOPHEN 7.5-325 MG PO TABS: 1.0000 | ORAL_TABLET | Freq: Four times a day (QID) | ORAL | Status: DC | PRN

## 2015-06-09 NOTE — Progress Notes (Signed)
[place] [phone]   [phone]  OFFICE PROGRESS NOTE  [name], PA-C [place] [address]  DIAGNOSIS: Metastatic non-small cell lung cancer, squamous cell carcinoma initially diagnosed as Stage IIA (T2b., N0, M0) non-small cell lung cancer, squamous cell carcinoma diagnosed in December of 2013   PRIOR THERAPY:  1) Status post right upper lobectomy under the care of Dr. Prescott Gum on 09/14/2012.  2) Adjuvant chemotherapy with cisplatin 75 mg/M2 and docetaxel 75 mg/M2 with Neulasta support every 3 weeks, status post 1 cycle. Starting with cycle #2, patient will receive carboplatin for an AUC initially of 4.5 and paclitaxel at 175 mg per meter squared with Neulasta support given every 3 weeks. Status post a total of 3 cycles.  3) Systemic chemotherapy with carboplatin for AUC of 5 on day 1 and gemcitabine 1000 MG/M2 on days 1 and 8 every 3 weeks. First dose 09/11/2014. Status post 4 cycles. Discontinued secondary to intolerance 4) Immunotherapy with Nivolumab 3 MG/KG every 2 weeks. Status post 4 cycles. 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  CURRENT THERAPY: Systemic chemotherapy with docetaxel 75 MG/M2 and Cyramza 10 MG/KG every 3 weeks with Neulasta support. Status post 3 cycles  CHEMOTHERAPY INTENT: Palliative. CURRENT # OF CHEMOTHERAPY CYCLES: 4 CURRENT ANTIEMETICS: Zofran, dexamethasone and Compazine  CURRENT SMOKING STATUS: Quit smoking recently.  ORAL CHEMOTHERAPY AND CONSENT: None  CURRENT BISPHOSPHONATES USE: None  PAIN MANAGEMENT: 3/10 controlled by Norco when necessary  NARCOTICS INDUCED CONSTIPATION: Milk of magnesia on as-needed basis.  LIVING WILL AND CODE STATUS: No CODE BLUE.   INTERVAL HISTORY:  [name] 74 y.o. male returns to the clinic today for followup visit accompanied his wife. The patient is currently undergoing systemic chemotherapy with  docetaxel and Cyramza status post 3 cycles and has been tolerating his treatment fairly well with no significant complaints except for fatigue for several days after the Neulasta injection. He has not recover from the last cycle of his treatment yet. He also continues to have shortness of breath and currently on home oxygen. He denied having any significant chest pain but has mild cough and no hemoptysis. He denied having any significant nausea or vomiting, no fever or chills, no weight loss or night sweats. He had a recent Port-A-Cath placement on the right side. The patient has repeat CT scan of the chest, abdomen and pelvis performed recently and he is here for evaluation and discussion of his scan results.   MEDICAL HISTORY: Past Medical History  Diagnosis Date  . Arthritis   . Wheezing   . Sore throat   . Carotid artery occlusion   . COPD (chronic obstructive pulmonary disease) ([place])   . Lumbar disc disease   . Restless leg syndrome   . Allergic rhinitis   . Hypertension   . Anxiety     anxiety  . Shortness of breath   . Lung mass   . GERD (gastroesophageal reflux disease)   . H/O hiatal hernia   . Pre-operative cardiovascular examination   . Nonspecific abnormal unspecified cardiovascular function study   . Peripheral vascular disease, unspecified ([place])   . Pneumonia 2014    ?   Marland Kitchen On home oxygen therapy     prn  . Constipation   . Cancer ([place]) 09/14/12    invasive mod diff squamous cell ca  . 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  S/P radiation therapy 12/04/14 srs    lt frontal brain    ALLERGIES:  has No Known Allergies.  MEDICATIONS:  Current Outpatient Prescriptions  Medication Sig Dispense Refill  . amLODipine (NORVASC) 10 MG tablet Takes half a tablet    . aspirin EC 81 MG tablet Take 81 mg by mouth daily.     . clonazePAM (KLONOPIN) 0.5 MG tablet Take 0.5 mg by mouth 2 (two) times daily as needed for anxiety. Takes one tablet by mouth two times daily  as needed for anxiety.    . furosemide (LASIX) 20 MG tablet Take 20 mg by mouth daily.    Marland Kitchen guaiFENesin (MUCINEX) 600 MG 12 hr tablet Take 1,200 mg by mouth 2 (two) times daily.    Marland Kitchen HYDROcodone-acetaminophen (NORCO) 7.5-325 MG tablet Take 1 tablet by mouth every 6 (six) hours as needed for moderate pain. 45 tablet 0  . ipratropium-albuterol (DUONEB) 0.5-2.5 (3) MG/3ML SOLN Take 3 mLs by nebulization 4 (four) times daily. (Patient taking differently: Take 3 mLs by nebulization 4 (four) times daily. Pt takes nebulizer 2 to 3 times daily.) 360 mL 2  . isosorbide mononitrate (IMDUR) 30 MG 24 hr tablet Take 1 tablet (30 mg total) by mouth daily. 30 tablet 3  . lisinopril (PRINIVIL,ZESTRIL) 20 MG tablet     . lisinopril (PRINIVIL,ZESTRIL) 40 MG tablet Take 40 mg by mouth daily.    Marland Kitchen loratadine (CLARITIN) 10 MG tablet Take 10 mg by mouth daily.    . metoprolol tartrate (LOPRESSOR) 25 MG tablet Take 25 mg by mouth 2 (two) times daily.     Marland Kitchen omeprazole (PRILOSEC) 40 MG capsule Take 40 mg by mouth daily.    . potassium chloride SA (K-DUR,KLOR-CON) 20 MEQ tablet Take 1 tablet (20 mEq total) by mouth daily. 30 tablet 1  . pramipexole (MIRAPEX) 1 MG tablet Take 1 mg by mouth at bedtime.    . traMADol (ULTRAM) 50 MG tablet Take 1 tablet (50 mg total) by mouth every 12 (twelve) hours as needed. 30 tablet 0  . Wound Dressings (SONAFINE) Apply 1 application topically daily.    Marland Kitchen zolpidem (AMBIEN) 10 MG tablet Take 10 mg by mouth at bedtime as needed for sleep.     No current facility-administered medications for this visit.   Facility-Administered Medications Ordered in Other Visits  Medication Dose Route Frequency Provider Last Rate Last Dose  . sodium chloride 0.9 % injection 10 mL  10 mL Intracatheter PRN Curt Bears, MD   10 mL at 01/29/15 0944    SURGICAL HISTORY:  Past Surgical History  Procedure Laterality Date  . Carotid endarterectomy  10/15/2010    left  . Hand surgery      repair of the  left long, ring, and small finger after a saw injury  . Video bronchoscopy  07/24/2012    Procedure: VIDEO BRONCHOSCOPY WITH FLUORO;  Surgeon: Kathee Delton, MD;  Location: [facility];  Service: Cardiopulmonary;  Laterality: Bilateral;  . Lobectomy Right 09/14/2012    Procedure: LOBECTOMY;  Surgeon: Ivin Poot, MD;  Location: [facility];  Service: Thoracic;  Laterality: Right;  RIGHT UPPER LOBECTOMY  . Video assisted thoracoscopy Right 09/14/2012    Procedure: VIDEO ASSISTED THORACOSCOPY;  Surgeon: Ivin Poot, MD;  Location: [facility];  Service: Thoracic;  Laterality: Right;  . Video bronchoscopy with endobronchial ultrasound N/A 08/27/2014    Procedure: VIDEO BRONCHOSCOPY WITH ENDOBRONCHIAL ULTRASOUND;  Surgeon: Ivin Poot, MD;  Location: [facility];  Service: Thoracic;  Laterality: N/A;  . [place]ene node biopsy Right 08/27/2014    Procedure: BIOPSY SCALENE NODE;  Surgeon: Ivin Poot, MD;  Location: [facility];  Service: Thoracic;  Laterality: Right;  . Sterotactic radiosurgery Right 09/25/14    right parietal 30Gy/1 fx    REVIEW OF SYSTEMS:  Constitutional: positive for fatigue Eyes: positive for visual disturbance Ears, nose, mouth, throat, and face: negative Respiratory: positive for cough and dyspnea on exertion Cardiovascular: negative Gastrointestinal: negative Genitourinary:negative Integument/breast: negative Hematologic/lymphatic: negative Musculoskeletal:negative Neurological: negative Behavioral/Psych: negative Endocrine: negative Allergic/Immunologic: negative   PHYSICAL EXAMINATION: General appearance: alert, cooperative and no distress Head: Normocephalic, without obvious abnormality, atraumatic Neck: no adenopathy, no JVD, supple, symmetrical, trachea midline and thyroid not enlarged, symmetric, no tenderness/mass/nodules Lymph nodes: Cervical, supraclavicular, and axillary nodes normal. Resp: wheezes bilaterally Back: symmetric, no curvature. ROM normal. No CVA  tenderness. Cardio: regular rate and rhythm, S1, S2 normal, no murmur, click, rub or gallop GI: soft, non-tender; bowel sounds normal; no masses,  no organomegaly Extremities: extremities normal, atraumatic, no cyanosis or edema Neurologic: Alert and oriented X 3, normal strength and tone. Normal symmetric reflexes. Normal coordination and gait  ECOG PERFORMANCE STATUS: 1 - Symptomatic but completely ambulatory  Blood pressure 156/68, pulse 112, temperature 98.1 F (36.7 C), temperature source Oral, resp. rate 20, height 5' 9.5" (1.765 m), weight 182 lb 14.4 oz (82.963 kg), SpO2 95 %.  LABORATORY DATA: Lab Results  Component Value Date   WBC 9.5 06/09/2015   HGB 10.9* 06/09/2015   HCT 34.4* 06/09/2015   MCV 96.9 06/09/2015   PLT 255 06/09/2015      Chemistry      Component Value Date/Time   NA 140 05/30/2015 1012   NA 138 12/05/2014 0618   K 4.5 05/30/2015 1012   K 3.4* 12/05/2014 0618   CL 95* 12/05/2014 0618   CL 101 12/28/2012 0852   CO2 27 05/30/2015 1012   CO2 32 12/05/2014 0618   BUN 8.1 05/30/2015 1012   BUN 9 12/05/2014 0618   CREATININE 1.1 05/30/2015 1012   CREATININE 0.52* 12/05/2014 0618      Component Value Date/Time   CALCIUM 9.4 05/30/2015 1012   CALCIUM 9.2 12/05/2014 0618   ALKPHOS 145 05/30/2015 1012   ALKPHOS 71 12/05/2014 0618   AST 22 05/30/2015 1012   AST 24 12/05/2014 0618   ALT 13 05/30/2015 1012   ALT 30 12/05/2014 0618   BILITOT <0.30 05/30/2015 1012   BILITOT 0.2* 12/05/2014 0618       RADIOGRAPHIC STUDIES: Ct Chest W Contrast  06/04/2015  CLINICAL DATA:  Status post right upper lobe resection for lung cancer 12/13. Currently on chemotherapy and radiation therapy. Restaging. EXAM: CT CHEST, ABDOMEN, AND PELVIS WITH CONTRAST TECHNIQUE: Multidetector CT imaging of the chest, abdomen and pelvis was performed following the standard protocol during bolus administration of intravenous contrast. CONTRAST:  176m OMNIPAQUE IOHEXOL 300 MG/ML   SOLN COMPARISON:  04/04/2015 FINDINGS: CT CHEST FINDINGS Mediastinum/Nodes: No supraclavicular adenopathy. A right Port-A-Cath terminates at the low SVC. No axillary adenopathy. Aortic and branch vessel atherosclerosis. Normal heart size, without pericardial effusion. Multivessel coronary artery atherosclerosis. No central pulmonary embolism, on this non-dedicated study. No mediastinal or hilar adenopathy. A tiny hiatal hernia. Lungs/Pleura: Similar right-sided pleural thickening with new small right pleural effusion. Right upper lobectomy. Lower lobe predominant bronchial wall thickening. Fluid in the right mainstem bronchus. Moderate centrilobular emphysema. Further decrease in size of pulmonary nodules, without well-defined residual nodule  identified. Mild scarring in the left lower lobe at the site of the nodule on prior exams. No lobar consolidation. Musculoskeletal: Posttraumatic or postsurgical defects of right-sided ribs. CT ABDOMEN PELVIS FINDINGS Hepatobiliary: Normal liver. Normal gallbladder, without biliary ductal dilatation. Pancreas: Normal, without mass or ductal dilatation. Spleen: Normal in size, without focal abnormality. Adrenals/Urinary Tract: Normal adrenal glands. No residual renal mass. Decreased excretion of contrast from the left kidney may relate to renal artery stenosis and resultant decreased inflow. Normal urinary bladder. Stomach/Bowel: Normal remainder of the stomach. Normal colon, appendix, and terminal ileum. Transverse duodenal diverticulum. Small bowel otherwise unremarkable. Vascular/Lymphatic: Advanced aortic and branch vessel atherosclerosis. No abdominopelvic adenopathy. Reproductive: Normal prostate. Other: No significant free fluid. No evidence of omental or peritoneal disease. Musculoskeletal: Bilateral L5 pars defects. Mild convex left lumbar spine curvature. Advanced lumbosacral spondylosis. IMPRESSION: 1. Further response to therapy. No residual pulmonary, right adrenal,  or right renal metastasis identified. 2. No sites of new or progressive disease. 3. Status post right upper lobectomy with centrilobular emphysema. 4. New trace right pleural fluid. 5.  Atherosclerosis, including within the coronary arteries. 6. Tiny hiatal hernia. 7. Suspect hemodynamically significant left renal artery atherosclerosis, as evidenced by decreased excretion of contrast from the left kidney on delayed images. Electronically Signed   By: [name] M.D.   On: 06/04/2015 12:18   Ct Abdomen Pelvis W Contrast  06/04/2015  CLINICAL DATA:  Status post right upper lobe resection for lung cancer 12/13. Currently on chemotherapy and radiation therapy. Restaging. EXAM: CT CHEST, ABDOMEN, AND PELVIS WITH CONTRAST TECHNIQUE: Multidetector CT imaging of the chest, abdomen and pelvis was performed following the standard protocol during bolus administration of intravenous contrast. CONTRAST:  149m OMNIPAQUE IOHEXOL 300 MG/ML  SOLN COMPARISON:  04/04/2015 FINDINGS: CT CHEST FINDINGS Mediastinum/Nodes: No supraclavicular adenopathy. A right Port-A-Cath terminates at the low SVC. No axillary adenopathy. Aortic and branch vessel atherosclerosis. Normal heart size, without pericardial effusion. Multivessel coronary artery atherosclerosis. No central pulmonary embolism, on this non-dedicated study. No mediastinal or hilar adenopathy. A tiny hiatal hernia. Lungs/Pleura: Similar right-sided pleural thickening with new small right pleural effusion. Right upper lobectomy. Lower lobe predominant bronchial wall thickening. Fluid in the right mainstem bronchus. Moderate centrilobular emphysema. Further decrease in size of pulmonary nodules, without well-defined residual nodule identified. Mild scarring in the left lower lobe at the site of the nodule on prior exams. No lobar consolidation. Musculoskeletal: Posttraumatic or postsurgical defects of right-sided ribs. CT ABDOMEN PELVIS FINDINGS Hepatobiliary: Normal liver.  Normal gallbladder, without biliary ductal dilatation. Pancreas: Normal, without mass or ductal dilatation. Spleen: Normal in size, without focal abnormality. Adrenals/Urinary Tract: Normal adrenal glands. No residual renal mass. Decreased excretion of contrast from the left kidney may relate to renal artery stenosis and resultant decreased inflow. Normal urinary bladder. Stomach/Bowel: Normal remainder of the stomach. Normal colon, appendix, and terminal ileum. Transverse duodenal diverticulum. Small bowel otherwise unremarkable. Vascular/Lymphatic: Advanced aortic and branch vessel atherosclerosis. No abdominopelvic adenopathy. Reproductive: Normal prostate. Other: No significant free fluid. No evidence of omental or peritoneal disease. Musculoskeletal: Bilateral L5 pars defects. Mild convex left lumbar spine curvature. Advanced lumbosacral spondylosis. IMPRESSION: 1. Further response to therapy. No residual pulmonary, right adrenal, or right renal metastasis identified. 2. No sites of new or progressive disease. 3. Status post right upper lobectomy with centrilobular emphysema. 4. New trace right pleural fluid. 5.  Atherosclerosis, including within the coronary arteries. 6. Tiny hiatal hernia. 7. Suspect hemodynamically significant left renal artery atherosclerosis, as evidenced by  decreased excretion of contrast from the left kidney on delayed images. Electronically Signed   By: [name] M.D.   On: 06/04/2015 12:18   Ir Fluoro Guide Cv Line Right  06/03/2015  CLINICAL DATA:  74 year old male with a history of metastatic lung cancer. EXAM: IR RIGHT FLOURO GUIDE CV LINE; IR ULTRASOUND GUIDANCE VASC ACCESS RIGHT Date: 06/03/2015 ANESTHESIA/SEDATION: Moderate (conscious) sedation was administered during this procedure. A total of 1.5 mg Versed and 50 mg Fentanyl were administered intravenously. [name] vital signs were monitored continuously by radiology nursing throughout the course of the procedure.  Total sedation time: 17 minutes FLUOROSCOPY TIME:  1 minutes 6 seconds TECHNIQUE: The right neck and chest was prepped with chlorhexidine, and draped in the usual sterile fashion using maximum barrier technique (cap and mask, sterile gown, sterile gloves, large sterile sheet, hand hygiene and cutaneous antiseptic). Antibiotic prophylaxis was provided with 2.0g Ancef administered IV one hour prior to skin incision. Local anesthesia was attained by infiltration with 1% lidocaine without epinephrine. Ultrasound demonstrated patency of the right internal jugular vein, and this was documented with an image. Under real-time ultrasound guidance, this vein was accessed with a 21 gauge micropuncture needle and image documentation was performed. A small dermatotomy was made at the access site with an 11 scalpel. A 0.018" wire was advanced into the SVC and the access needle exchanged for a 76F micropuncture vascular sheath. The 0.018" wire was then removed and a 0.035" wire advanced into the IVC. An appropriate location for the subcutaneous reservoir was selected below the clavicle and an incision was made through the skin and underlying soft tissues. The subcutaneous tissues were then dissected using a combination of blunt and sharp surgical technique and a pocket was formed. A single lumen power injectable portacatheter was then tunneled through the subcutaneous tissues from the pocket to the dermatotomy and the port reservoir placed within the subcutaneous pocket. The venous access site was then serially dilated and a peel away vascular sheath placed over the wire. The wire was removed and the port catheter advanced into position under fluoroscopic guidance. The catheter tip is positioned in the upper right atrium. This was documented with a spot image. The portacatheter was then tested and found to flush and aspirate well. The port was flushed with saline followed by 100 units/mL heparinized saline. The pocket was then  closed in two layers using first subdermal inverted interrupted absorbable sutures followed by a running subcuticular suture. The epidermis was then sealed with Dermabond. The dermatotomy at the venous access site was also closed with a single inverted subdermal suture and the epidermis sealed with Dermabond. COMPLICATIONS: None IMPRESSION: Status post placement of right IJ approach port catheter. Catheter ready for use. Signed, Dulcy Fanny. Earleen Newport, DO [facility] Radiology Electronically Signed   By: Corrie Mckusick D.O.   On: 06/03/2015 10:57   Ir US Guide Vasc Access Right  06/03/2015  CLINICAL DATA:  74 year old male with a history of metastatic lung cancer. EXAM: IR RIGHT FLOURO GUIDE CV LINE; IR ULTRASOUND GUIDANCE VASC ACCESS RIGHT Date: 06/03/2015 ANESTHESIA/SEDATION: Moderate (conscious) sedation was administered during this procedure. A total of 1.5 mg Versed and 50 mg Fentanyl were administered intravenously. [name] vital signs were monitored continuously by radiology nursing throughout the course of the procedure. Total sedation time: 17 minutes FLUOROSCOPY TIME:  1 minutes 6 seconds TECHNIQUE: The right neck and chest was prepped with chlorhexidine, and draped in the usual sterile fashion  using maximum barrier technique (cap and mask, sterile gown, sterile gloves, large sterile sheet, hand hygiene and cutaneous antiseptic). Antibiotic prophylaxis was provided with 2.0g Ancef administered IV one hour prior to skin incision. Local anesthesia was attained by infiltration with 1% lidocaine without epinephrine. Ultrasound demonstrated patency of the right internal jugular vein, and this was documented with an image. Under real-time ultrasound guidance, this vein was accessed with a 21 gauge micropuncture needle and image documentation was performed. A small dermatotomy was made at the access site with an 11 scalpel. A 0.018" wire was advanced into the SVC  and the access needle exchanged for a 29F micropuncture vascular sheath. The 0.018" wire was then removed and a 0.035" wire advanced into the IVC. An appropriate location for the subcutaneous reservoir was selected below the clavicle and an incision was made through the skin and underlying soft tissues. The subcutaneous tissues were then dissected using a combination of blunt and sharp surgical technique and a pocket was formed. A single lumen power injectable portacatheter was then tunneled through the subcutaneous tissues from the pocket to the dermatotomy and the port reservoir placed within the subcutaneous pocket. The venous access site was then serially dilated and a peel away vascular sheath placed over the wire. The wire was removed and the port catheter advanced into position under fluoroscopic guidance. The catheter tip is positioned in the upper right atrium. This was documented with a spot image. The portacatheter was then tested and found to flush and aspirate well. The port was flushed with saline followed by 100 units/mL heparinized saline. The pocket was then closed in two layers using first subdermal inverted interrupted absorbable sutures followed by a running subcuticular suture. The epidermis was then sealed with Dermabond. The dermatotomy at the venous access site was also closed with a single inverted subdermal suture and the epidermis sealed with Dermabond. COMPLICATIONS: None IMPRESSION: Status post placement of right IJ approach port catheter. Catheter ready for use. Signed, Dulcy Fanny. Earleen Newport, DO [facility] Radiology Electronically Signed   By: Corrie Mckusick D.O.   On: 06/03/2015 10:57    ASSESSMENT AND PLAN: This is a very pleasant 74 years old white male with history of stage IIA non-small cell lung cancer status post right upper lobectomy followed by 4 cycles of adjuvant chemotherapy.  The patient underwent systemic chemotherapy with  carboplatin and gemcitabine status post 4 cycles discontinued secondary to intolerance and mild disease progression.  The patient completed treatment with Nivolumab status post 4 cycles but this was discontinued secondary to disease progression. He also recently completed stereotactic radiotherapy to metastatic brain lesions. The patient is currently undergoing systemic chemotherapy with docetaxel and Cyramza status post 3 cycles and has been tolerating his treatment well except for the aching pain after Neulasta injection. His recent CT scan of the chest, abdomen and pelvis showed further improvement of his disease with no concerning finding for disease progression. I discussed the scan results with the patient and his wife. I recommended for him to continue his current treatment with docetaxel and Cyramza but I will delay the start of cycle #4 by 1 week to give the patient more time to recover from the adverse effect of the last cycle. He will also continue his palliative radiation as a scheduled by Dr. Lisbeth Renshaw. For pain management, the patient was given a refill of Vicodin today. He would come back for follow-up visit in one month's for reevaluation with the start of cycle #  5. He was advised to call immediately if he has any concerning symptoms in the interval.  The patient voices understanding of current disease status and treatment options and is in agreement with the current care plan.  All questions were answered. The patient knows to call the clinic with any problems, questions or concerns. We can certainly see the patient much sooner if necessary.  Disclaimer: This note was dictated with voice recognition software. Similar sounding words can inadvertently be transcribed and may not be corrected upon review.

## 2015-06-09 NOTE — Telephone Encounter (Signed)
Gave and printed appt sched and avs for pt for NOV and DEC

## 2015-06-10 ENCOUNTER — Ambulatory Visit
Admission: RE | Admit: 2015-06-10 | Discharge: 2015-06-10 | Disposition: A | Discharge status: Home / Self Care | Payer: Medicare Other | Source: Ambulatory Visit | Attending: Radiation Oncology | Admitting: Radiation Oncology

## 2015-06-10 DIAGNOSIS — Z510 Encounter for antineoplastic radiation therapy: Secondary | ICD-10-CM | POA: Diagnosis not present

## 2015-06-11 ENCOUNTER — Ambulatory Visit
Admission: RE | Admit: 2015-06-11 | Discharge: 2015-06-11 | Disposition: A | Discharge status: Home / Self Care | Payer: Medicare Other | Source: Ambulatory Visit | Attending: Radiation Oncology | Admitting: Radiation Oncology

## 2015-06-11 DIAGNOSIS — Z510 Encounter for antineoplastic radiation therapy: Secondary | ICD-10-CM | POA: Diagnosis not present

## 2015-06-12 ENCOUNTER — Ambulatory Visit
Admission: RE | Admit: 2015-06-12 | Discharge: 2015-06-12 | Disposition: A | Discharge status: Home / Self Care | Payer: Medicare Other | Source: Ambulatory Visit | Attending: Radiation Oncology | Admitting: Radiation Oncology

## 2015-06-12 DIAGNOSIS — Z510 Encounter for antineoplastic radiation therapy: Secondary | ICD-10-CM | POA: Diagnosis not present

## 2015-06-13 ENCOUNTER — Ambulatory Visit
Admission: RE | Admit: 2015-06-13 | Discharge: 2015-06-13 | Disposition: A | Discharge status: Home / Self Care | Payer: Medicare Other | Source: Ambulatory Visit | Attending: Radiation Oncology | Admitting: Radiation Oncology

## 2015-06-13 ENCOUNTER — Encounter: Payer: Self-pay | Admitting: Radiation Oncology

## 2015-06-13 ENCOUNTER — Other Ambulatory Visit: Payer: Self-pay | Admitting: *Deleted

## 2015-06-13 VITALS — BP 174/91 | HR 107 | Temp 97.6°F | Resp 22 | Wt 182.4 lb

## 2015-06-13 DIAGNOSIS — C770 Secondary and unspecified malignant neoplasm of lymph nodes of head, face and neck: Secondary | ICD-10-CM

## 2015-06-13 DIAGNOSIS — C7931 Secondary malignant neoplasm of brain: Secondary | ICD-10-CM

## 2015-06-13 DIAGNOSIS — Z510 Encounter for antineoplastic radiation therapy: Secondary | ICD-10-CM | POA: Diagnosis not present

## 2015-06-13 DIAGNOSIS — C7949 Secondary malignant neoplasm of other parts of nervous system: Principal | ICD-10-CM

## 2015-06-13 NOTE — Progress Notes (Signed)
Weekly rad txs right neck, 9/14 c/o sob 97% 2 liters n/c, nausea  In evenings after he eats stated,  Taking hydrocodone for right  shoulder pain, poor appetite,  Some difficuklty swallowing foods at times, no thrush seen in mouth or tongue, fatigued and weak,  8:32 AM BP 174/91 mmHg  Pulse 107  Temp(Src) 97.6 F (36.4 C) (Oral)  Resp 22  Wt 182 lb 6.4 oz (82.736 kg)  SpO2 97%  Wt Readings from Last 3 Encounters:  06/13/15 182 lb 6.4 oz (82.736 kg)  06/09/15 182 lb 14.4 oz (82.963 kg)  06/03/15 180 lb 1.6 oz (81.693 kg)

## 2015-06-13 NOTE — Progress Notes (Signed)
Department of Radiation Oncology  Phone:  [phone] Fax:        [phone]  Weekly Treatment Note    Name: [name] Date: 06/13/2015 MRN: [id number] DOB: 1941-01-19   Current dose: 22.5 Gy  Current fraction: 9   MEDICATIONS: Current Outpatient Prescriptions  Medication Sig Dispense Refill  . amLODipine (NORVASC) 10 MG tablet Takes half a tablet    . aspirin EC 81 MG tablet Take 81 mg by mouth daily.     . clonazePAM (KLONOPIN) 0.5 MG tablet Take 0.5 mg by mouth 2 (two) times daily as needed for anxiety. Takes one tablet by mouth two times daily as needed for anxiety.    . furosemide (LASIX) 20 MG tablet Take 20 mg by mouth daily.    Marland Kitchen guaiFENesin (MUCINEX) 600 MG 12 hr tablet Take 1,200 mg by mouth 2 (two) times daily.    Marland Kitchen HYDROcodone-acetaminophen (NORCO) 7.5-325 MG tablet Take 1 tablet by mouth every 6 (six) hours as needed for moderate pain. 45 tablet 0  . ipratropium-albuterol (DUONEB) 0.5-2.5 (3) MG/3ML SOLN Take 3 mLs by nebulization 4 (four) times daily. (Patient taking differently: Take 3 mLs by nebulization 4 (four) times daily. Pt takes nebulizer 2 to 3 times daily.) 360 mL 2  . isosorbide mononitrate (IMDUR) 30 MG 24 hr tablet Take 1 tablet (30 mg total) by mouth daily. 30 tablet 3  . lisinopril (PRINIVIL,ZESTRIL) 20 MG tablet     . lisinopril (PRINIVIL,ZESTRIL) 40 MG tablet Take 40 mg by mouth daily.    Marland Kitchen loratadine (CLARITIN) 10 MG tablet Take 10 mg by mouth daily.    . metoprolol tartrate (LOPRESSOR) 25 MG tablet Take 25 mg by mouth 2 (two) times daily.     Marland Kitchen omeprazole (PRILOSEC) 40 MG capsule Take 40 mg by mouth daily.    . potassium chloride SA (K-DUR,KLOR-CON) 20 MEQ tablet Take 1 tablet (20 mEq total) by mouth daily. 30 tablet 1  . pramipexole (MIRAPEX) 1 MG tablet Take 1 mg by mouth at bedtime.    . traMADol (ULTRAM) 50 MG tablet Take 1 tablet (50 mg total) by mouth every 12 (twelve) hours as needed. 30 tablet 0  . Wound Dressings (SONAFINE)  Apply 1 application topically daily.    Marland Kitchen zolpidem (AMBIEN) 10 MG tablet Take 10 mg by mouth at bedtime as needed for sleep.     No current facility-administered medications for this encounter.   Facility-Administered Medications Ordered in Other Encounters  Medication Dose Route Frequency Provider Last Rate Last Dose  . sodium chloride 0.9 % injection 10 mL  10 mL Intracatheter PRN Curt Bears, MD   10 mL at 01/29/15 0944     ALLERGIES: Review of patient's allergies indicates no known allergies.   LABORATORY DATA:  Lab Results  Component Value Date   WBC 9.5 06/09/2015   HGB 10.9* 06/09/2015   HCT 34.4* 06/09/2015   MCV 96.9 06/09/2015   PLT 255 06/09/2015   Lab Results  Component Value Date   NA 140 06/09/2015   K 4.5 06/09/2015   CL 95* 12/05/2014   CO2 27 06/09/2015   Lab Results  Component Value Date   ALT <9 06/09/2015   AST 14 06/09/2015   ALKPHOS 66 06/09/2015   BILITOT <0.30 06/09/2015     NARRATIVE: [name] was seen today for weekly treatment management. The chart was checked and the patient's films were reviewed.  [name] states that he feels an ache in  his shoulder. He is taking hydrocodone for this right shoulder pain. He complains of shortness of breath 97% 2 liters n/c. He has some nausea in the evenings after he eats and mentions that he has a poor appetite. He has some difficulty while swallowing sometimes. He states he feels fatigued and weak.  BP 174/91 mmHg  Pulse 107  Temp(Src) 97.6 F (36.4 C) (Oral)  Resp 22  Wt 182 lb 6.4 oz (82.736 kg)  SpO2 97%  PHYSICAL EXAMINATION: weight is 182 lb 6.4 oz (82.736 kg). His oral temperature is 97.6 F (36.4 C). His blood pressure is 174/91 and his pulse is 107. His respiration is 22 and oxygen saturation is 97%.        His port site has healed well. His incision is healing well.  ASSESSMENT/plan: The patient is doing satisfactorily with treatment. He will finish treatment next week and  he will follow up in a month. Patient stated he didn't ned any Magic mouthwash right now, but he will let us know if his condition worsens.    This document serves as a record of services personally performed by Tyler Pita, MD. It was created on his behalf by Lendon Collar, a trained medical scribe. The creation of this record is based on the scribe's personal observations and the provider's statements to them. This document has been checked and approved by the attending provider.

## 2015-06-16 ENCOUNTER — Other Ambulatory Visit (HOSPITAL_BASED_OUTPATIENT_CLINIC_OR_DEPARTMENT_OTHER): Payer: Medicare Other

## 2015-06-16 ENCOUNTER — Ambulatory Visit (HOSPITAL_BASED_OUTPATIENT_CLINIC_OR_DEPARTMENT_OTHER): Payer: Medicare Other

## 2015-06-16 ENCOUNTER — Ambulatory Visit
Admission: RE | Admit: 2015-06-16 | Discharge: 2015-06-16 | Disposition: A | Discharge status: Home / Self Care | Payer: Medicare Other | Source: Ambulatory Visit | Attending: Radiation Oncology | Admitting: Radiation Oncology

## 2015-06-16 VITALS — BP 163/91 | HR 96 | Temp 97.8°F | Resp 20

## 2015-06-16 DIAGNOSIS — Z5112 Encounter for antineoplastic immunotherapy: Secondary | ICD-10-CM | POA: Diagnosis present

## 2015-06-16 DIAGNOSIS — C3411 Malignant neoplasm of upper lobe, right bronchus or lung: Secondary | ICD-10-CM

## 2015-06-16 DIAGNOSIS — C3491 Malignant neoplasm of unspecified part of right bronchus or lung: Secondary | ICD-10-CM

## 2015-06-16 DIAGNOSIS — Z5111 Encounter for antineoplastic chemotherapy: Secondary | ICD-10-CM | POA: Diagnosis not present

## 2015-06-16 DIAGNOSIS — C7931 Secondary malignant neoplasm of brain: Secondary | ICD-10-CM

## 2015-06-16 DIAGNOSIS — Z510 Encounter for antineoplastic radiation therapy: Secondary | ICD-10-CM | POA: Diagnosis not present

## 2015-06-16 LAB — CBC WITH DIFFERENTIAL/PLATELET
BASO%: 1 % (ref 0.0–2.0)
Basophils Absolute: 0.1 10*3/uL (ref 0.0–0.1)
EOS%: 0.7 % (ref 0.0–7.0)
Eosinophils Absolute: 0 10*3/uL (ref 0.0–0.5)
HEMATOCRIT: 34 % — AB (ref 38.4–49.9)
HEMOGLOBIN: 10.7 g/dL — AB (ref 13.0–17.1)
LYMPH#: 1.8 10*3/uL (ref 0.9–3.3)
LYMPH%: 29.9 % (ref 14.0–49.0)
MCH: 30.7 pg (ref 27.2–33.4)
MCHC: 31.5 g/dL — ABNORMAL LOW (ref 32.0–36.0)
MCV: 97.7 fL (ref 79.3–98.0)
MONO#: 1.1 10*3/uL — ABNORMAL HIGH (ref 0.1–0.9)
MONO%: 17.3 % — AB (ref 0.0–14.0)
NEUT%: 51.1 % (ref 39.0–75.0)
NEUTROS ABS: 3.1 10*3/uL (ref 1.5–6.5)
Platelets: 209 10*3/uL (ref 140–400)
RBC: 3.48 10*6/uL — ABNORMAL LOW (ref 4.20–5.82)
RDW: 17.9 % — AB (ref 11.0–14.6)
WBC: 6.1 10*3/uL (ref 4.0–10.3)

## 2015-06-16 LAB — COMPREHENSIVE METABOLIC PANEL
ALBUMIN: 3.4 g/dL — AB (ref 3.5–5.0)
ALK PHOS: 66 U/L (ref 40–150)
AST: 14 U/L (ref 5–34)
Anion Gap: 6 mEq/L (ref 3–11)
BUN: 13.7 mg/dL (ref 7.0–26.0)
CALCIUM: 9.3 mg/dL (ref 8.4–10.4)
CO2: 26 mEq/L (ref 22–29)
CREATININE: 1.1 mg/dL (ref 0.7–1.3)
Chloride: 108 mEq/L (ref 98–109)
EGFR: 71 mL/min/{1.73_m2} — ABNORMAL LOW (ref >90)
GLUCOSE: 93 mg/dL (ref 70–140)
POTASSIUM: 4.9 meq/L (ref 3.5–5.1)
SODIUM: 140 meq/L (ref 136–145)
TOTAL PROTEIN: 6.6 g/dL (ref 6.4–8.3)

## 2015-06-16 MED ORDER — HEPARIN SOD (PORK) LOCK FLUSH 100 UNIT/ML IV SOLN
500.0000 [IU] | Freq: Once | INTRAVENOUS | Status: AC | PRN
  Administered 2015-06-16: 500 [IU]
  Filled 2015-06-16: qty 5

## 2015-06-16 MED ORDER — DIPHENHYDRAMINE HCL 50 MG/ML IJ SOLN
INTRAMUSCULAR | Status: AC
  Filled 2015-06-16: qty 1

## 2015-06-16 MED ORDER — SODIUM CHLORIDE 0.9 % IJ SOLN
10.0000 mL | INTRAMUSCULAR | Status: DC | PRN
  Administered 2015-06-16: 10 mL
  Filled 2015-06-16: qty 10

## 2015-06-16 MED ORDER — DIPHENHYDRAMINE HCL 50 MG/ML IJ SOLN
50.0000 mg | Freq: Once | INTRAMUSCULAR | Status: AC
  Administered 2015-06-16: 50 mg via INTRAVENOUS

## 2015-06-16 MED ORDER — DOCETAXEL CHEMO INJECTION 160 MG/16ML
75.0000 mg/m2 | Freq: Once | INTRAVENOUS | Status: AC
  Administered 2015-06-16: 150 mg via INTRAVENOUS
  Filled 2015-06-16: qty 15

## 2015-06-16 MED ORDER — ACETAMINOPHEN 325 MG PO TABS
650.0000 mg | ORAL_TABLET | Freq: Once | ORAL | Status: AC
  Administered 2015-06-16: 650 mg via ORAL

## 2015-06-16 MED ORDER — SODIUM CHLORIDE 0.9 % IV SOLN
10.0000 mg/kg | Freq: Once | INTRAVENOUS | Status: AC
  Administered 2015-06-16: 800 mg via INTRAVENOUS
  Filled 2015-06-16: qty 70

## 2015-06-16 MED ORDER — SODIUM CHLORIDE 0.9 % IV SOLN
Freq: Once | INTRAVENOUS | Status: AC
  Administered 2015-06-16: 09:00:00 via INTRAVENOUS
  Filled 2015-06-16: qty 4

## 2015-06-16 MED ORDER — ACETAMINOPHEN 325 MG PO TABS
ORAL_TABLET | ORAL | Status: AC
  Filled 2015-06-16: qty 2

## 2015-06-16 MED ORDER — SODIUM CHLORIDE 0.9 % IV SOLN
Freq: Once | INTRAVENOUS | Status: AC
  Administered 2015-06-16: 09:00:00 via INTRAVENOUS

## 2015-06-16 NOTE — Patient Instructions (Signed)
[facility] Discharge Instructions for Patients Receiving Chemotherapy  Today you received the following chemotherapy agents taxotere/cyramza  To help prevent nausea and vomiting after your treatment, we encourage you to take your nausea medication as directed.    If you develop nausea and vomiting that is not controlled by your nausea medication, call the clinic.   BELOW ARE SYMPTOMS THAT SHOULD BE REPORTED IMMEDIATELY:  *FEVER GREATER THAN 100.5 F  *CHILLS WITH OR WITHOUT FEVER  NAUSEA AND VOMITING THAT IS NOT CONTROLLED WITH YOUR NAUSEA MEDICATION  *UNUSUAL SHORTNESS OF BREATH  *UNUSUAL BRUISING OR BLEEDING  TENDERNESS IN MOUTH AND THROAT WITH OR WITHOUT PRESENCE OF ULCERS  *URINARY PROBLEMS  *BOWEL PROBLEMS  UNUSUAL RASH Items with * indicate a potential emergency and should be followed up as soon as possible.  Feel free to call the clinic you have any questions or concerns. The clinic phone number is [phone].  

## 2015-06-17 ENCOUNTER — Ambulatory Visit: Payer: Medicare Other

## 2015-06-17 ENCOUNTER — Ambulatory Visit
Admission: RE | Admit: 2015-06-17 | Discharge: 2015-06-17 | Disposition: A | Discharge status: Home / Self Care | Payer: Medicare Other | Source: Ambulatory Visit | Attending: Radiation Oncology | Admitting: Radiation Oncology

## 2015-06-17 DIAGNOSIS — Z510 Encounter for antineoplastic radiation therapy: Secondary | ICD-10-CM | POA: Diagnosis not present

## 2015-06-18 ENCOUNTER — Ambulatory Visit: Payer: Medicare Other

## 2015-06-18 ENCOUNTER — Ambulatory Visit (HOSPITAL_BASED_OUTPATIENT_CLINIC_OR_DEPARTMENT_OTHER): Payer: Medicare Other

## 2015-06-18 ENCOUNTER — Ambulatory Visit
Admission: RE | Admit: 2015-06-18 | Discharge: 2015-06-18 | Disposition: A | Discharge status: Home / Self Care | Payer: Medicare Other | Source: Ambulatory Visit | Attending: Radiation Oncology | Admitting: Radiation Oncology

## 2015-06-18 VITALS — BP 152/69 | HR 107 | Temp 98.2°F

## 2015-06-18 DIAGNOSIS — C3491 Malignant neoplasm of unspecified part of right bronchus or lung: Secondary | ICD-10-CM

## 2015-06-18 DIAGNOSIS — Z510 Encounter for antineoplastic radiation therapy: Secondary | ICD-10-CM | POA: Diagnosis not present

## 2015-06-18 MED ORDER — PEGFILGRASTIM INJECTION 6 MG/0.6ML ~~LOC~~
6.0000 mg | PREFILLED_SYRINGE | Freq: Once | SUBCUTANEOUS | Status: AC
  Administered 2015-06-18: 6 mg via SUBCUTANEOUS
  Filled 2015-06-18: qty 0.6

## 2015-06-19 ENCOUNTER — Ambulatory Visit
Admission: RE | Admit: 2015-06-19 | Discharge: 2015-06-19 | Disposition: A | Discharge status: Home / Self Care | Payer: Medicare Other | Source: Ambulatory Visit | Attending: Radiation Oncology | Admitting: Radiation Oncology

## 2015-06-19 ENCOUNTER — Telehealth: Payer: Self-pay | Admitting: Internal Medicine

## 2015-06-19 ENCOUNTER — Telehealth: Payer: Self-pay | Admitting: *Deleted

## 2015-06-19 ENCOUNTER — Ambulatory Visit (HOSPITAL_BASED_OUTPATIENT_CLINIC_OR_DEPARTMENT_OTHER): Payer: Self-pay | Admitting: Nurse Practitioner

## 2015-06-19 ENCOUNTER — Ambulatory Visit: Payer: Medicare Other

## 2015-06-19 ENCOUNTER — Encounter: Payer: Self-pay | Admitting: Radiation Oncology

## 2015-06-19 ENCOUNTER — Other Ambulatory Visit: Payer: Medicare Other

## 2015-06-19 VITALS — BP 158/65 | HR 94 | Temp 98.6°F | Resp 22 | Wt 186.7 lb

## 2015-06-19 DIAGNOSIS — Z510 Encounter for antineoplastic radiation therapy: Secondary | ICD-10-CM | POA: Diagnosis not present

## 2015-06-19 DIAGNOSIS — C3490 Malignant neoplasm of unspecified part of unspecified bronchus or lung: Secondary | ICD-10-CM

## 2015-06-19 DIAGNOSIS — R601 Generalized edema: Secondary | ICD-10-CM

## 2015-06-19 NOTE — Progress Notes (Signed)
Department of Radiation Oncology  Phone:  [phone] Fax:        [phone]  Weekly Treatment Note    Name: [name] Date: 06/19/2015 MRN: [id number]  DOB: [birth year]   Current dose: 32.5 Gy  Current fraction: 13   MEDICATIONS: Current Outpatient Prescriptions  Medication Sig Dispense Refill  . amLODipine (NORVASC) 10 MG tablet Takes half a tablet    . aspirin EC 81 MG tablet Take 81 mg by mouth daily.     . clonazePAM (KLONOPIN) 0.5 MG tablet Take 0.5 mg by mouth 2 (two) times daily as needed for anxiety. Takes one tablet by mouth two times daily as needed for anxiety.    . furosemide (LASIX) 20 MG tablet Take 20 mg by mouth daily.    Marland Kitchen guaiFENesin (MUCINEX) 600 MG 12 hr tablet Take 1,200 mg by mouth 2 (two) times daily.    Marland Kitchen HYDROcodone-acetaminophen (NORCO) 7.5-325 MG tablet Take 1 tablet by mouth every 6 (six) hours as needed for moderate pain. 45 tablet 0  . ipratropium-albuterol (DUONEB) 0.5-2.5 (3) MG/3ML SOLN Take 3 mLs by nebulization 4 (four) times daily. (Patient taking differently: Take 3 mLs by nebulization 4 (four) times daily. Pt takes nebulizer 2 to 3 times daily.) 360 mL 2  . isosorbide mononitrate (IMDUR) 30 MG 24 hr tablet Take 1 tablet (30 mg total) by mouth daily. 30 tablet 3  . lisinopril (PRINIVIL,ZESTRIL) 20 MG tablet     . lisinopril (PRINIVIL,ZESTRIL) 40 MG tablet Take 40 mg by mouth daily.    Marland Kitchen loratadine (CLARITIN) 10 MG tablet Take 10 mg by mouth daily.    . magic mouthwash w/lidocaine SOLN Take 5 mLs by mouth 3 (three) times daily as needed for mouth pain.    . metoprolol tartrate (LOPRESSOR) 25 MG tablet Take 25 mg by mouth 2 (two) times daily.     Marland Kitchen omeprazole (PRILOSEC) 40 MG capsule Take 40 mg by mouth daily.    . potassium chloride SA (K-DUR,KLOR-CON) 20 MEQ tablet Take 1 tablet (20 mEq total) by mouth daily. 30 tablet 1  . pramipexole (MIRAPEX) 1 MG tablet Take 1 mg by mouth at bedtime.    . traMADol (ULTRAM) 50 MG tablet Take 1  tablet (50 mg total) by mouth every 12 (twelve) hours as needed. 30 tablet 0  . Wound Dressings (SONAFINE) Apply 1 application topically daily.    Marland Kitchen zolpidem (AMBIEN) 10 MG tablet Take 10 mg by mouth at bedtime as needed for sleep.     No current facility-administered medications for this encounter.   Facility-Administered Medications Ordered in Other Encounters  Medication Dose Route Frequency Provider Last Rate Last Dose  . sodium chloride 0.9 % injection 10 mL  10 mL Intracatheter PRN Curt Bears, MD   10 mL at 01/29/15 0944     ALLERGIES: Review of patient's allergies indicates no known allergies.   LABORATORY DATA:  Lab Results  Component Value Date   WBC 6.1 06/16/2015   HGB 10.7* 06/16/2015   HCT 34.0* 06/16/2015   MCV 97.7 06/16/2015   PLT 209 06/16/2015   Lab Results  Component Value Date   NA 140 06/16/2015   K 4.9 06/16/2015   CL 95* 12/05/2014   CO2 26 06/16/2015   Lab Results  Component Value Date   ALT <9 06/16/2015   AST 14 06/16/2015   ALKPHOS 66 06/16/2015   BILITOT <0.30 06/16/2015     NARRATIVE: [name] was seen today for  weekly treatment management. The chart was checked and the patient's films were reviewed.  The patient has some mild erythema, using sonafine cream daily. Patient has skin intact. He coughs up white phlegm. He has nausea at night after eating dinner and does take nausea medication. He uses MMW as needed but is not shown in med list. He has ulcers on back of tongue. Patient feels fatigued and short of breath. He had chemotherapy this past Monday. He states that he has no pain. He mentions that he has a sore throat.   BP 158/65 mmHg  Pulse 94  Temp(Src) 98.6 F (37 C) (Oral)  Resp 22  Wt 186 lb 11.2 oz (84.687 kg)  SpO2 100%  PHYSICAL EXAMINATION: weight is 186 lb 11.2 oz (84.687 kg). His oral temperature is 98.6 F (37 C). His blood pressure is 158/65 and his pulse is 94. His respiration is 22 and oxygen saturation  is 100%.        His port site has healed well. His incision is healing well.  ASSESSMENT/plan: The patient is doing satisfactorily with treatment. He is finishing treatment tomorrow, 06/20/2015. He has a follow up appointment with Dr. Lisbeth Renshaw in a month.    This document serves as a record of services personally performed by Tyler Pita, MD. It was created on his behalf by Lendon Collar, a trained medical scribe. The creation of this record is based on the scribe's personal observations and the provider's statements to them. This document has been checked and approved by the attending provider.

## 2015-06-19 NOTE — Telephone Encounter (Signed)
returned call and lvm to confirm monday appt

## 2015-06-19 NOTE — Progress Notes (Addendum)
wekly rad txs right neck, mild erythema, using sonafine cream daily,skin intact, coughs up white phelgm, nausea at night after eating dinner does take nasuea medication, uses MMW prn not shown in med list, ulcers on back of tongue,   . fatigued, sob, 100% 2 liters n/c, Chemotherapy this past Monday 06/16/15,  Labs okay,  No pain stated  8:58 AM BP 158/65 mmHg  Pulse 94  Temp(Src) 98.6 F (37 C) (Oral)  Resp 22  Wt 186 lb 11.2 oz (84.687 kg)  SpO2 100%  Wt Readings from Last 3 Encounters:  06/19/15 186 lb 11.2 oz (84.687 kg)  06/13/15 182 lb 6.4 oz (82.736 kg)  06/09/15 182 lb 14.4 oz (82.963 kg)

## 2015-06-19 NOTE — Telephone Encounter (Signed)
TC from patient stating that he is experiencing increased swelling in legs and feet, increased weight to 186 lbs from 182 lbs in 1 weeks time.  He also states that he is developing a sore on one of his legs and is concerned about that.   Offered visit in [facility] today but pt prefers to come in tomorrow am after his last radiation treatment. Most recent labs were 3 days ago.

## 2015-06-19 NOTE — Telephone Encounter (Signed)
Per message from triage moved 12/8 [place] to 12/9 @ 9:15 am after xrt. Appointment moved and confirmed with patient.

## 2015-06-20 ENCOUNTER — Encounter: Payer: Self-pay | Admitting: Radiation Oncology

## 2015-06-20 ENCOUNTER — Encounter: Payer: Self-pay | Admitting: Nurse Practitioner

## 2015-06-20 ENCOUNTER — Ambulatory Visit (HOSPITAL_COMMUNITY)
Admission: RE | Admit: 2015-06-20 | Discharge: 2015-06-20 | Disposition: A | Discharge status: Home / Self Care | Payer: Medicare Other | Source: Ambulatory Visit | Attending: Nurse Practitioner | Admitting: Nurse Practitioner

## 2015-06-20 ENCOUNTER — Other Ambulatory Visit (HOSPITAL_COMMUNITY)
Admission: RE | Admit: 2015-06-20 | Discharge: 2015-06-20 | Disposition: A | Discharge status: Home / Self Care | Payer: Medicare Other | Source: Ambulatory Visit | Attending: Internal Medicine | Admitting: Internal Medicine

## 2015-06-20 ENCOUNTER — Ambulatory Visit (HOSPITAL_BASED_OUTPATIENT_CLINIC_OR_DEPARTMENT_OTHER): Payer: Medicare Other | Admitting: Nurse Practitioner

## 2015-06-20 ENCOUNTER — Ambulatory Visit
Admission: RE | Admit: 2015-06-20 | Discharge: 2015-06-20 | Disposition: A | Discharge status: Home / Self Care | Payer: Medicare Other | Source: Ambulatory Visit | Attending: Radiation Oncology | Admitting: Radiation Oncology

## 2015-06-20 ENCOUNTER — Other Ambulatory Visit (HOSPITAL_BASED_OUTPATIENT_CLINIC_OR_DEPARTMENT_OTHER): Payer: Medicare Other

## 2015-06-20 VITALS — BP 151/83 | HR 114 | Temp 98.2°F | Resp 24

## 2015-06-20 DIAGNOSIS — Z902 Acquired absence of lung [part of]: Secondary | ICD-10-CM | POA: Diagnosis not present

## 2015-06-20 DIAGNOSIS — R0602 Shortness of breath: Secondary | ICD-10-CM

## 2015-06-20 DIAGNOSIS — R140 Abdominal distension (gaseous): Secondary | ICD-10-CM | POA: Insufficient documentation

## 2015-06-20 DIAGNOSIS — M7989 Other specified soft tissue disorders: Secondary | ICD-10-CM | POA: Insufficient documentation

## 2015-06-20 DIAGNOSIS — R5381 Other malaise: Secondary | ICD-10-CM

## 2015-06-20 DIAGNOSIS — D72829 Elevated white blood cell count, unspecified: Secondary | ICD-10-CM

## 2015-06-20 DIAGNOSIS — R112 Nausea with vomiting, unspecified: Secondary | ICD-10-CM

## 2015-06-20 DIAGNOSIS — R601 Generalized edema: Secondary | ICD-10-CM | POA: Diagnosis not present

## 2015-06-20 DIAGNOSIS — Z9981 Dependence on supplemental oxygen: Secondary | ICD-10-CM | POA: Insufficient documentation

## 2015-06-20 DIAGNOSIS — G2581 Restless legs syndrome: Secondary | ICD-10-CM | POA: Diagnosis not present

## 2015-06-20 DIAGNOSIS — R0600 Dyspnea, unspecified: Secondary | ICD-10-CM

## 2015-06-20 DIAGNOSIS — R609 Edema, unspecified: Secondary | ICD-10-CM | POA: Diagnosis present

## 2015-06-20 DIAGNOSIS — J449 Chronic obstructive pulmonary disease, unspecified: Secondary | ICD-10-CM | POA: Insufficient documentation

## 2015-06-20 DIAGNOSIS — C3490 Malignant neoplasm of unspecified part of unspecified bronchus or lung: Secondary | ICD-10-CM | POA: Diagnosis present

## 2015-06-20 DIAGNOSIS — K219 Gastro-esophageal reflux disease without esophagitis: Secondary | ICD-10-CM | POA: Insufficient documentation

## 2015-06-20 DIAGNOSIS — I2510 Atherosclerotic heart disease of native coronary artery without angina pectoris: Secondary | ICD-10-CM | POA: Insufficient documentation

## 2015-06-20 DIAGNOSIS — Z85118 Personal history of other malignant neoplasm of bronchus and lung: Secondary | ICD-10-CM | POA: Insufficient documentation

## 2015-06-20 DIAGNOSIS — C3491 Malignant neoplasm of unspecified part of right bronchus or lung: Secondary | ICD-10-CM

## 2015-06-20 DIAGNOSIS — Z9889 Other specified postprocedural states: Secondary | ICD-10-CM | POA: Insufficient documentation

## 2015-06-20 DIAGNOSIS — Z510 Encounter for antineoplastic radiation therapy: Secondary | ICD-10-CM | POA: Diagnosis not present

## 2015-06-20 DIAGNOSIS — F419 Anxiety disorder, unspecified: Secondary | ICD-10-CM | POA: Insufficient documentation

## 2015-06-20 DIAGNOSIS — Z923 Personal history of irradiation: Secondary | ICD-10-CM | POA: Insufficient documentation

## 2015-06-20 DIAGNOSIS — C3400 Malignant neoplasm of unspecified main bronchus: Secondary | ICD-10-CM

## 2015-06-20 DIAGNOSIS — R197 Diarrhea, unspecified: Secondary | ICD-10-CM | POA: Insufficient documentation

## 2015-06-20 DIAGNOSIS — I10 Essential (primary) hypertension: Secondary | ICD-10-CM | POA: Diagnosis not present

## 2015-06-20 LAB — CBC WITH DIFFERENTIAL/PLATELET
BASO%: 0.2 % (ref 0.0–2.0)
Basophils Absolute: 0.1 10*3/uL (ref 0.0–0.1)
EOS%: 0.4 % (ref 0.0–7.0)
Eosinophils Absolute: 0.1 10*3/uL (ref 0.0–0.5)
HEMATOCRIT: 32.6 % — AB (ref 38.4–49.9)
HGB: 10.3 g/dL — ABNORMAL LOW (ref 13.0–17.1)
LYMPH%: 5.5 % — AB (ref 14.0–49.0)
MCH: 30.9 pg (ref 27.2–33.4)
MCHC: 31.6 g/dL — AB (ref 32.0–36.0)
MCV: 97.9 fL (ref 79.3–98.0)
MONO#: 0.1 10*3/uL (ref 0.1–0.9)
MONO%: 0.4 % (ref 0.0–14.0)
NEUT%: 93.5 % — AB (ref 39.0–75.0)
NEUTROS ABS: 19.9 10*3/uL — AB (ref 1.5–6.5)
Platelets: 128 10*3/uL — ABNORMAL LOW (ref 140–400)
RBC: 3.33 10*6/uL — AB (ref 4.20–5.82)
RDW: 18.1 % — ABNORMAL HIGH (ref 11.0–14.6)
WBC: 21.3 10*3/uL — AB (ref 4.0–10.3)
lymph#: 1.2 10*3/uL (ref 0.9–3.3)
nRBC: 0 % (ref 0–0)

## 2015-06-20 LAB — COMPREHENSIVE METABOLIC PANEL
AST: 17 U/L (ref 5–34)
Albumin: 3.5 g/dL (ref 3.5–5.0)
Alkaline Phosphatase: 87 U/L (ref 40–150)
Anion Gap: 11 mEq/L (ref 3–11)
BUN: 16.7 mg/dL (ref 7.0–26.0)
CHLORIDE: 104 meq/L (ref 98–109)
CO2: 23 meq/L (ref 22–29)
CREATININE: 1 mg/dL (ref 0.7–1.3)
Calcium: 8.6 mg/dL (ref 8.4–10.4)
EGFR: 74 mL/min/{1.73_m2} — ABNORMAL LOW (ref >90)
GLUCOSE: 92 mg/dL (ref 70–140)
Potassium: 3.9 mEq/L (ref 3.5–5.1)
SODIUM: 138 meq/L (ref 136–145)
Total Bilirubin: 0.61 mg/dL (ref 0.20–1.20)
Total Protein: 6.8 g/dL (ref 6.4–8.3)

## 2015-06-20 LAB — BRAIN NATRIURETIC PEPTIDE: B Natriuretic Peptide: 156.6 pg/mL — ABNORMAL HIGH (ref 0.0–100.0)

## 2015-06-20 MED ORDER — AZITHROMYCIN 250 MG PO TABS: ORAL_TABLET | ORAL | Status: DC

## 2015-06-20 MED ORDER — METHYLPREDNISOLONE 4 MG PO TBPK: ORAL_TABLET | ORAL | Status: DC

## 2015-06-20 NOTE — Assessment & Plan Note (Addendum)
Patient received his last Neulasta injection on  Wednesday, 06/18/2015.Marland Kitchen  His white count obtained today was 21.3 with an ANC of 19.9.  Leukocytosis at 21.3 within less than 48 hours of Neulasta is concerning for questionable infection.  Patient denies any recent fevers or chills.  On exam today.  Patient's vital signs were stable; the exception of an heart rate of 114.  Temperature was 98.2.  After review of all previously mentioned symptoms with Dr. Burr Medico- decision was made to cover patient for any possible infection with Zithromax.  Patient was advised to go directly to the emergency department over the weekend for any worsening symptoms./Fever/chills.

## 2015-06-20 NOTE — Progress Notes (Signed)
SYMPTOM MANAGEMENT CLINIC   HPI: [name] 74 y.o. male diagnosed with lung cancer; with brain metastasis.  Currently undergoing docetaxel/Cyramza chemotherapy regimen.  Just completed his radiation treatments today.  Patient received cycle 4 of his docetaxel/cyramza/Neulasta chemotherapy regimen on 06/16/2015.  Patient states that he has been experiencing increased fatigue and deconditioning since his last cycle of chemotherapy.  He feels weak as well.  He is reporting some chronic nausea and intermittent episodes of vomiting.  He is also having some occasional diarrhea as well.  He reports that his chronic bilateral lower extremity edema has increased; and also feels he has some overall, analyzed edema to his body and face as well.  He states that his radiation mask is not fitting well; since his face has become more edematous.  He also feels that his abdomen is edematous/distended and more firm.  He denies any abdominal discomfort whatsoever.  He also reports increased shortness of breath and wheezing since his last cycle of chemotherapy.  He typically wears oxygen at home 24/7 at 2 L nasal cannula.  He states that he only uses his nebulizer treatments at home twice daily.  He denies any recent fevers or chills.  HPI  ROS  Past Medical History  Diagnosis Date  . Arthritis   . Wheezing   . Sore throat   . Carotid artery occlusion   . COPD (chronic obstructive pulmonary disease) ([place])   . Lumbar disc disease   . Restless leg syndrome   . Allergic rhinitis   . Hypertension   . Anxiety     anxiety  . Shortness of breath   . Lung mass   . GERD (gastroesophageal reflux disease)   . H/O hiatal hernia   . Pre-operative cardiovascular examination   . Nonspecific abnormal unspecified cardiovascular function study   . Peripheral vascular disease, unspecified ([place])   . Pneumonia 2014    ?   Marland Kitchen On home oxygen therapy     prn  . Constipation   . Cancer ([place]) 09/14/12    invasive mod  diff squamous cell ca  . 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 S/P radiation therapy 12/04/14 srs    lt frontal brain    Past Surgical History  Procedure Laterality Date  . Carotid endarterectomy  10/15/2010    left  . Hand surgery      repair of the left long, ring, and small finger after a saw injury  . Video bronchoscopy  07/24/2012    Procedure: VIDEO BRONCHOSCOPY WITH FLUORO;  Surgeon: Kathee Delton, MD;  Location: [facility];  Service: Cardiopulmonary;  Laterality: Bilateral;  . Lobectomy Right 09/14/2012    Procedure: LOBECTOMY;  Surgeon: Ivin Poot, MD;  Location: [place];  Service: Thoracic;  Laterality: Right;  RIGHT UPPER LOBECTOMY  . Video assisted thoracoscopy Right 09/14/2012    Procedure: VIDEO ASSISTED THORACOSCOPY;  Surgeon: Ivin Poot, MD;  Location: [place];  Service: Thoracic;  Laterality: Right;  . Video bronchoscopy with endobronchial ultrasound N/A 08/27/2014    Procedure: VIDEO BRONCHOSCOPY WITH ENDOBRONCHIAL ULTRASOUND;  Surgeon: Ivin Poot, MD;  Location: [place];  Service: Thoracic;  Laterality: N/A;  . Scalene node biopsy Right 08/27/2014    Procedure: BIOPSY SCALENE NODE;  Surgeon: Ivin Poot, MD;  Location: [facility] OR;  Service: Thoracic;  Laterality: Right;  . Sterotactic radiosurgery Right 09/25/14    right parietal 30Gy/1 fx    has Occlusion  and stenosis of carotid artery without mention of cerebral infarction; COPD (chronic obstructive pulmonary disease) ([place]); Lung mass; Pre-operative cardiovascular examination; Nonspecific abnormal unspecified cardiovascular function study; Peripheral vascular disease, unspecified ([place]); Lung cancer ([place]); Pain in limb; Coronary atherosclerosis of native coronary artery; Essential hypertension, benign; Tobacco use disorder; Carotid stenosis; Aftercare following surgery of the circulatory system; Weakness-Hand and  Legs; COPD exacerbation ([place]); Tachycardia; Dyspnea; Brain metastasis ([place]); Chemotherapy  induced neutropenia ([place]); Renal stone; Pyelonephritis; Dehydration; UTI (lower urinary tract infection); Left arm cellulitis; Blood poisoning ([place]); Sepsis ([place]); Encounter for antineoplastic immunotherapy; Cancer associated pain; Rash; Syncope; Constipation; Insomnia; Hypokalemia; Peripheral edema; Extravasation, infusion or chemotherapeutic agent; Epistaxis; Metastatic lung cancer (metastasis from lung to other site) [facility]); Poor venous access; 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    has No Known Allergies.    Medication List       This list is accurate as of: 06/20/15  4:49 PM.  Always use your most recent med list.               amLODipine 10 MG tablet  Commonly known as:  NORVASC  Takes half a tablet     aspirin EC 81 MG tablet  Take 81 mg by mouth daily.     azithromycin 250 MG tablet  Commonly known as:  ZITHROMAX Z-PAK  Z Pak: take as directed.     clonazePAM 0.5 MG tablet  Commonly known as:  KLONOPIN  Take 0.5 mg by mouth 2 (two) times daily as needed for anxiety. Takes one tablet by mouth two times daily as needed for anxiety.     guaiFENesin 600 MG 12 hr tablet  Commonly known as:  MUCINEX  Take 1,200 mg by mouth 2 (two) times daily.     HYDROcodone-acetaminophen 7.5-325 MG tablet  Commonly known as:  NORCO  Take 1 tablet by mouth every 6 (six) hours as needed for moderate pain.     ipratropium-albuterol 0.5-2.5 (3) MG/3ML Soln  Commonly known as:  DUONEB  Take 3 mLs by nebulization 4 (four) times daily.     isosorbide mononitrate 30 MG 24 hr tablet  Commonly known as:  IMDUR  Take 1 tablet (30 mg total) by mouth daily.     lisinopril 40 MG tablet  Commonly known as:  PRINIVIL,ZESTRIL  Take 40 mg by mouth daily.     lisinopril 20 MG tablet  Commonly known as:  PRINIVIL,ZESTRIL     loratadine 10 MG tablet  Commonly known as:  CLARITIN  Take 10  mg by mouth daily.     magic mouthwash w/lidocaine Soln  Take 5 mLs by mouth 3 (three) times daily as needed for mouth pain.     methylPREDNISolone 4 MG Tbpk tablet  Commonly known as:  MEDROL DOSEPAK  Medrol dose pak: take as directed.     metoprolol tartrate 25 MG tablet  Commonly known as:  LOPRESSOR  Take 25 mg by mouth 2 (two) times daily.     omeprazole 40 MG capsule  Commonly known as:  PRILOSEC  Take 40 mg by mouth daily.     potassium chloride SA 20 MEQ tablet  Commonly known as:  K-DUR,KLOR-CON  Take 1 tablet (20 mEq total) by mouth daily.     pramipexole 1 MG tablet  Commonly known as:  MIRAPEX  Take 1 mg by mouth at bedtime.     prochlorperazine 10 MG  tablet  Commonly known as:  COMPAZINE  Take 10 mg by mouth every 6 (six) hours as needed for nausea or vomiting.     SONAFINE  Apply 1 application topically daily.     traMADol 50 MG tablet  Commonly known as:  ULTRAM  Take 1 tablet (50 mg total) by mouth every 12 (twelve) hours as needed.     zolpidem 10 MG tablet  Commonly known as:  AMBIEN  Take 10 mg by mouth at bedtime as needed for sleep.         PHYSICAL EXAMINATION  Oncology Vitals 06/20/2015 06/19/2015  Height - -  Weight - 84.687 kg  Weight (lbs) - 186 lbs 11 oz  BMI (kg/m2) - -  Temp 98.2 98.6  Pulse 114 94  Resp 24 22  SpO2 100 100  BSA (m2) - -   BP Readings from Last 2 Encounters:  06/20/15 151/83  06/19/15 158/65    Physical Exam  Constitutional: He is oriented to person, place, and time. He appears unhealthy.  HENT:  Head: Normocephalic and atraumatic.  Mouth/Throat: Oropharynx is clear and moist.  Eyes: Conjunctivae and EOM are normal. Pupils are equal, round, and reactive to light. Right eye exhibits no discharge. Left eye exhibits no discharge. No scleral icterus.  Neck: Normal range of motion. Neck supple. No JVD present. No tracheal deviation present. No thyromegaly present.  Cardiovascular: Normal heart sounds and intact  distal pulses.   Tachycardic.  Pulmonary/Chest: He is in respiratory distress. He has wheezes. He has no rales. He exhibits no tenderness.  Patient with wheezing to all lung fields.  No acute respiratory distress.  Abdominal: Soft. Bowel sounds are normal. He exhibits distension. He exhibits no mass. There is no tenderness. There is no rebound and no guarding.  Musculoskeletal: Normal range of motion. He exhibits edema. He exhibits no tenderness.  +2-3 edema to bilateral lower extremities; with the left slightly greater than the right.  Lymphadenopathy:    He has no cervical adenopathy.  Neurological: He is alert and oriented to person, place, and time.  Skin: Skin is warm and dry. No rash noted. No erythema. No pallor.  Patient has an approximately 1.5 cm in diameter raised lesion to his right medial lower leg.  Psychiatric: Affect normal.  Nursing note and vitals reviewed.   LABORATORY DATA:. Hospital Outpatient Visit on 06/20/2015  Component Date Value Ref Range Status  . B Natriuretic Peptide 06/20/2015 156.6* 0.0 - 100.0 pg/mL Final  Appointment on 06/20/2015  Component Date Value Ref Range Status  . WBC 06/20/2015 21.3* 4.0 - 10.3 10e3/uL Final  . NEUT# 06/20/2015 19.9* 1.5 - 6.5 10e3/uL Final  . HGB 06/20/2015 10.3* 13.0 - 17.1 g/dL Final  . HCT 06/20/2015 32.6* 38.4 - 49.9 % Final  . Platelets 06/20/2015 128* 140 - 400 10e3/uL Final  . MCV 06/20/2015 97.9  79.3 - 98.0 fL Final  . MCH 06/20/2015 30.9  27.2 - 33.4 pg Final  . MCHC 06/20/2015 31.6* 32.0 - 36.0 g/dL Final  . RBC 06/20/2015 3.33* 4.20 - 5.82 10e6/uL Final  . RDW 06/20/2015 18.1* 11.0 - 14.6 % Final  . lymph# 06/20/2015 1.2  0.9 - 3.3 10e3/uL Final  . MONO# 06/20/2015 0.1  0.1 - 0.9 10e3/uL Final  . Eosinophils Absolute 06/20/2015 0.1  0.0 - 0.5 10e3/uL Final  . Basophils Absolute 06/20/2015 0.1  0.0 - 0.1 10e3/uL Final  . NEUT% 06/20/2015 93.5* 39.0 - 75.0 % Final  . LYMPH% 06/20/2015  5.5* 14.0 - 49.0 % Final    . MONO% 06/20/2015 0.4  0.0 - 14.0 % Final  . EOS% 06/20/2015 0.4  0.0 - 7.0 % Final  . BASO% 06/20/2015 0.2  0.0 - 2.0 % Final  . nRBC 06/20/2015 0  0 - 0 % Final  . Sodium 06/20/2015 138  136 - 145 mEq/L Final  . Potassium 06/20/2015 3.9  3.5 - 5.1 mEq/L Final  . Chloride 06/20/2015 104  98 - 109 mEq/L Final  . CO2 06/20/2015 23  22 - 29 mEq/L Final  . Glucose 06/20/2015 92  70 - 140 mg/dl Final   Glucose reference range is for nonfasting patients. Fasting glucose reference range is 70- 100.  Marland Kitchen BUN 06/20/2015 16.7  7.0 - 26.0 mg/dL Final  . Creatinine 06/20/2015 1.0  0.7 - 1.3 mg/dL Final  . Total Bilirubin 06/20/2015 0.61  0.20 - 1.20 mg/dL Final  . Alkaline Phosphatase 06/20/2015 87  40 - 150 U/L Final  . AST 06/20/2015 17  5 - 34 U/L Final  . ALT 06/20/2015 <9  0 - 55 U/L Final  . Total Protein 06/20/2015 6.8  6.4 - 8.3 g/dL Final  . Albumin 06/20/2015 3.5  3.5 - 5.0 g/dL Final  . Calcium 06/20/2015 8.6  8.4 - 10.4 mg/dL Final  . Anion Gap 06/20/2015 11  3 - 11 mEq/L Final  . EGFR 06/20/2015 74* >90 ml/min/1.73 m2 Final   eGFR is calculated using the CKD-EPI Creatinine Equation (2009)   Right lower leg:       RADIOGRAPHIC STUDIES: Dg Abd Acute W/chest  06/20/2015  CLINICAL DATA:  74 year old male with history of distended abdomen. History of lung cancer. EXAM: DG ABDOMEN ACUTE W/ 1V CHEST COMPARISON:  CT the chest, abdomen and pelvis 06/04/2015. Chest CT 12/30/2014. FINDINGS: Postoperative changes of right upper lobectomy are again noted, with chronic right-sided volume loss, similar to the prior examination. No acute consolidative airspace disease. No pleural effusions. No suspicious appearing pulmonary nodules or masses are noted. Heart size is normal. Upper mediastinal contours are within normal limits. Atherosclerosis in the thoracic aorta. Right-sided internal jugular single-lumen power porta cath with tip terminating in the distal superior vena cava. Surgical clips along  the right chest wall. Postthoracotomy changes in several right ribs. Supine and upright views of the abdomen demonstrate a small amount of gas and stool throughout the colon. Several nondilated loops of gas-filled small bowel are noted in the abdomen, predominantly in the right upper quadrant. No pneumoperitoneum. IMPRESSION: 1. Nonspecific but nonobstructive bowel gas pattern, as above. 2. No pneumoperitoneum. 3. Postoperative changes of prior right upper lobectomy redemonstrated, without evidence of acute cardiopulmonary disease. 4. Atherosclerosis. Electronically Signed   By: Vinnie Langton M.D.   On: 06/20/2015 12:20    ASSESSMENT/PLAN:    Physical deconditioning Patient continues to undergo chemotherapy; and just completed his last day of radiation today.  He reports increased fatigue and deconditioning.  He has also noticed some increased shortness of breath recently as well.  He denies any recent fevers or chills.  Patient was advised that he is most likely, very deconditioned following his radiation treatment and continued chemotherapy.  He was encouraged to remain as active as possible.  Nausea with vomiting Patient states that he has been experiencing some chronic nausea and intermittent vomiting since his last cycle of chemotherapy.  He states that he R he has nausea medication to use at home.  He does not feel dehydrated today.  Lung  cancer Patient received cycle 4 of his docetaxel/cyramza/Neulasta chemotherapy regimen on 06/16/2015.  Patient states that he has been experiencing increased fatigue and deconditioning since his last cycle of chemotherapy.  He feels weak as well.  He is reporting some chronic nausea and intermittent episodes of vomiting.  He is also having some occasional diarrhea as well.  He reports that his chronic bilateral lower extremity edema has increased; and also feels he has some overall, analyzed edema to his body and face as well.  He states that his radiation  mask is not fitting well; since his face has become more edematous.  He also feels that his abdomen is edematous/distended and more firm.  He denies any abdominal discomfort whatsoever.  He also reports increased shortness of breath and wheezing since his last cycle of chemotherapy.  He typically wears oxygen at home 24/7 at 2 L nasal cannula.  He states that he only uses his nebulizer treatments at home twice daily.  He denies any recent fevers or chills.  Blood counts obtained today reveal a WBC of 21.3, ANC 19.9, hemoglobin 10.3, and platelet count 128.  See further notes for details of today's visit.  Patient completed his radiation treatments today.  Patient is scheduled for restaging brain MRI on 06/25/2015.  Patient is scheduled for labs only on 06/30/2015.  Patient is scheduled for labs, visit, and chemotherapy on December 27th 2016.  Leukocytosis Patient received his last Neulasta injection on  Wednesday, 06/18/2015.Marland Kitchen  His white count obtained today was 21.3 with an ANC of 19.9.  Leukocytosis at 21.3 within less than 48 hours of Neulasta is concerning for questionable infection.  Patient denies any recent fevers or chills.  On exam today.  Patient's vital signs were stable; the exception of an heart rate of 114.  Temperature was 98.2.  After review of all previously mentioned symptoms with Dr. Burr Medico- decision was made to cover patient for any possible infection with Zithromax.  Patient was advised to go directly to the emergency department over the weekend for any worsening symptoms./Fever/chills.    Edema Patient received cycle 4 of his docetaxel/cyramza/Neulasta chemotherapy regimen on 06/16/2015.  Patient states that he has been experiencing increased fatigue and deconditioning since his last cycle of chemotherapy.  He feels weak as well.  He is reporting some chronic nausea and intermittent episodes of vomiting.  He is also having some occasional diarrhea as well.  He reports that his  chronic bilateral lower extremity edema has increased; and also feels he has some overall, analyzed edema to his body and face as well.  He states that his radiation mask is not fitting well; since his face has become more edematous.  He also feels that his abdomen is edematous/distended and more firm.  He denies any abdominal discomfort whatsoever.  He also reports increased shortness of breath and wheezing since his last cycle of chemotherapy.  He typically wears oxygen at home 24/7 at 2 L nasal cannula.  He states that he only uses his nebulizer treatments at home twice daily.  He denies any recent fevers or chills.  Patient does have +2-3 bilateral lower extremity edema; with the left slightly greater than the right.  There is no calf tenderness on exam.  Patient does also have some trace facial edema as well.  Patient is noted to have a chronic red lesion that is slightly raised to his right medial lower leg.  Patient states that he has had this lesion to his leg for  greater than 3 months; but feels that it may be getting worse.  On exam.-Area is approximately 1.5 cm in diameter with some mild erythema to the lesion itself.  There is no surrounding erythema, warmth, tenderness, or red streaks.  Lesion to right lower leg could quite possibly be a spider bite or insect bite.  Patient was advised to monitor this area closely.  Patient was advised to try Neosporin and/or hydrocortisone to the lesion on his leg to see if this helps.  He may very well need to follow with his primary care physician or dermatologist for further evaluation if it does not improve.  Also, left lower extremity Doppler ultrasound was negative for DVT.  Regarding patient's worsening edema-BNP was only slightly elevated at 156.6 today.  Patient was advised to elevate his feet above the level start whenever possible.  We'll continue to monitor closely.      Dyspnea Patient received cycle 4 of his docetaxel/cyramza/Neulasta  chemotherapy regimen on 06/16/2015.  Patient states that he has been experiencing increased fatigue and deconditioning since his last cycle of chemotherapy.  He feels weak as well.  He is reporting some chronic nausea and intermittent episodes of vomiting.  He is also having some occasional diarrhea as well.  He reports that his chronic bilateral lower extremity edema has increased; and also feels he has some overall, analyzed edema to his body and face as well.  He states that his radiation mask is not fitting well; since his face has become more edematous.  He also feels that his abdomen is edematous/distended and more firm.  He denies any abdominal discomfort whatsoever.  He also reports increased shortness of breath and wheezing since his last cycle of chemotherapy.  He typically wears oxygen at home 24/7 at 2 L nasal cannula.  He states that he only uses his nebulizer treatments at home twice daily.  He denies any recent fevers or chills.  Bilateral breath sounds with wheezing to all lung fields.  Patient with no cough.  No acute respiratory distress noted.  Patient continues with O2 at 2 L per nasal cannula.  Chest x-ray obtained today revealed no acute findings.  Also, abdominal x-ray was essentially normal as well.  Reviewed all findings with Dr. Burr Medico- advised that patient's symptoms could very well be secondary to continued chemotherapy and radiation treatments.  Patient could be experiencing a COPD exacerbation versus an impending infection.  Patient was given a Z-Pak and a Medrol Dosepak to start today.  He was also encouraged to go directly to the emergency department over the weekend for any worsening symptoms whatsoever.        Diarrhea Patient has been experiencing some intermittent episodes of diarrhea as well.  He knows to take Imodium over-the-counter if needed.  Most likely, diarrhea secondary to his chemotherapy treatments.   Patient stated understanding of all instructions;  and was in agreement with this plan of care. The patient knows to call the clinic with any problems, questions or concerns.   Review/collaboration with [name] regarding all aspects of patient's visit today.   Total time spent with patient was 40 minutes;  with greater than 75 percent of that time spent in face to face counseling regarding patient's symptoms,  and coordination of care and follow up.  Disclaimer:This dictation was prepared with Dragon/digital dictation along with [employer]. Any transcriptional errors that result from this process are unintentional.  Drue Second, NP 06/20/2015

## 2015-06-20 NOTE — Progress Notes (Signed)
VASCULAR LAB PRELIMINARY  PRELIMINARY  PRELIMINARY  PRELIMINARY  Left lower extremity venous duplex completed.    Preliminary report:  Left:  No evidence of DVT, superficial thrombosis, or Baker's cyst.  Auriah Hollings, RVT 06/20/2015, 12:24 PM

## 2015-06-20 NOTE — Assessment & Plan Note (Signed)
Patient received cycle 4 of his docetaxel/cyramza/Neulasta chemotherapy regimen on 06/16/2015.  Patient states that he has been experiencing increased fatigue and deconditioning since his last cycle of chemotherapy.  He feels weak as well.  He is reporting some chronic nausea and intermittent episodes of vomiting.  He is also having some occasional diarrhea as well.  He reports that his chronic bilateral lower extremity edema has increased; and also feels he has some overall, analyzed edema to his body and face as well.  He states that his radiation mask is not fitting well; since his face has become more edematous.  He also feels that his abdomen is edematous/distended and more firm.  He denies any abdominal discomfort whatsoever.  He also reports increased shortness of breath and wheezing since his last cycle of chemotherapy.  He typically wears oxygen at home 24/7 at 2 L nasal cannula.  He states that he only uses his nebulizer treatments at home twice daily.  He denies any recent fevers or chills.  Blood counts obtained today reveal a WBC of 21.3, ANC 19.9, hemoglobin 10.3, and platelet count 128.  See further notes for details of today's visit.  Patient completed his radiation treatments today.  Patient is scheduled for restaging brain MRI on 06/25/2015.  Patient is scheduled for labs only on 06/30/2015.  Patient is scheduled for labs, visit, and chemotherapy on December 27th 2016.

## 2015-06-20 NOTE — Assessment & Plan Note (Signed)
Patient continues to undergo chemotherapy; and just completed his last day of radiation today.  He reports increased fatigue and deconditioning.  He has also noticed some increased shortness of breath recently as well.  He denies any recent fevers or chills.  Patient was advised that he is most likely, very deconditioned following his radiation treatment and continued chemotherapy.  He was encouraged to remain as active as possible.

## 2015-06-20 NOTE — Assessment & Plan Note (Signed)
Patient received cycle 4 of his docetaxel/cyramza/Neulasta chemotherapy regimen on 06/16/2015.  Patient states that he has been experiencing increased fatigue and deconditioning since his last cycle of chemotherapy.  He feels weak as well.  He is reporting some chronic nausea and intermittent episodes of vomiting.  He is also having some occasional diarrhea as well.  He reports that his chronic bilateral lower extremity edema has increased; and also feels he has some overall, analyzed edema to his body and face as well.  He states that his radiation mask is not fitting well; since his face has become more edematous.  He also feels that his abdomen is edematous/distended and more firm.  He denies any abdominal discomfort whatsoever.  He also reports increased shortness of breath and wheezing since his last cycle of chemotherapy.  He typically wears oxygen at home 24/7 at 2 L nasal cannula.  He states that he only uses his nebulizer treatments at home twice daily.  He denies any recent fevers or chills.  Bilateral breath sounds with wheezing to all lung fields.  Patient with no cough.  No acute respiratory distress noted.  Patient continues with O2 at 2 L per nasal cannula.  Chest x-ray obtained today revealed no acute findings.  Also, abdominal x-ray was essentially normal as well.  Reviewed all findings with Dr. Burr Medico- advised that patient's symptoms could very well be secondary to continued chemotherapy and radiation treatments.  Patient could be experiencing a COPD exacerbation versus an impending infection.  Patient was given a Z-Pak and a Medrol Dosepak to start today.  He was also encouraged to go directly to the emergency department over the weekend for any worsening symptoms whatsoever.

## 2015-06-20 NOTE — Assessment & Plan Note (Signed)
Patient has been experiencing some intermittent episodes of diarrhea as well.  He knows to take Imodium over-the-counter if needed.  Most likely, diarrhea secondary to his chemotherapy treatments.

## 2015-06-20 NOTE — Assessment & Plan Note (Addendum)
Patient received cycle 4 of his docetaxel/cyramza/Neulasta chemotherapy regimen on 06/16/2015.  Patient states that he has been experiencing increased fatigue and deconditioning since his last cycle of chemotherapy.  He feels weak as well.  He is reporting some chronic nausea and intermittent episodes of vomiting.  He is also having some occasional diarrhea as well.  He reports that his chronic bilateral lower extremity edema has increased; and also feels he has some overall, analyzed edema to his body and face as well.  He states that his radiation mask is not fitting well; since his face has become more edematous.  He also feels that his abdomen is edematous/distended and more firm.  He denies any abdominal discomfort whatsoever.  He also reports increased shortness of breath and wheezing since his last cycle of chemotherapy.  He typically wears oxygen at home 24/7 at 2 L nasal cannula.  He states that he only uses his nebulizer treatments at home twice daily.  He denies any recent fevers or chills.  Patient does have +2-3 bilateral lower extremity edema; with the left slightly greater than the right.  There is no calf tenderness on exam.  Patient does also have some trace facial edema as well.  Patient is noted to have a chronic red lesion that is slightly raised to his right medial lower leg.  Patient states that he has had this lesion to his leg for greater than 3 months; but feels that it may be getting worse.  On exam.-Area is approximately 1.5 cm in diameter with some mild erythema to the lesion itself.  There is no surrounding erythema, warmth, tenderness, or red streaks.  Lesion to right lower leg could quite possibly be a spider bite or insect bite.  Patient was advised to monitor this area closely.  Patient was advised to try Neosporin and/or hydrocortisone to the lesion on his leg to see if this helps.  He may very well need to follow with his primary care physician or dermatologist for further  evaluation if it does not improve.  Also, left lower extremity Doppler ultrasound was negative for DVT.  Regarding patient's worsening edema-BNP was only slightly elevated at 156.6 today.  Patient was advised to elevate his feet above the level start whenever possible.  We'll continue to monitor closely.

## 2015-06-20 NOTE — Assessment & Plan Note (Signed)
Patient states that he has been experiencing some chronic nausea and intermittent vomiting since his last cycle of chemotherapy.  He states that he R he has nausea medication to use at home.  He does not feel dehydrated today.

## 2015-06-23 ENCOUNTER — Telehealth: Payer: Self-pay | Admitting: *Deleted

## 2015-06-23 ENCOUNTER — Other Ambulatory Visit: Payer: Medicare Other

## 2015-06-23 NOTE — Telephone Encounter (Signed)
TC to pt to check status following [facility] visit 12/9- Pt reports no change since visit. He has increased neb tx to TID but does not feel any change is SOB. He states his sinuses are filling. He has a "drippy nose". He has stopped using aquafor in his nose and there is irritation in his nasal cavity from the O2 tubing. Advised pt to start using ointment again and start back on his antihistamine. Pt verbalized an understanding and knows to call this office to report any changes.

## 2015-06-23 NOTE — Telephone Encounter (Signed)
Call received from patient's wife [name].  [name] or someone called [name].  He doesn't know what was said or told to do.   Need to talk with [name] to learn waht she and [name] decided to do.  Can you connect me with [name]?" Voicemail received with call transfer.

## 2015-06-23 NOTE — Telephone Encounter (Signed)
Called and spoke to [employer]. Relayed information from original message to her. [name] states she has not been able to be home much due to caring for her mother. She understands to call this office with any concerns or questions.

## 2015-06-25 ENCOUNTER — Ambulatory Visit
Admission: RE | Admit: 2015-06-25 | Discharge: 2015-06-25 | Disposition: A | Discharge status: Home / Self Care | Payer: Medicare Other | Source: Ambulatory Visit | Attending: Radiation Oncology | Admitting: Radiation Oncology

## 2015-06-25 DIAGNOSIS — C7931 Secondary malignant neoplasm of brain: Secondary | ICD-10-CM

## 2015-06-25 DIAGNOSIS — C7949 Secondary malignant neoplasm of other parts of nervous system: Principal | ICD-10-CM

## 2015-06-25 MED ORDER — GADOBENATE DIMEGLUMINE 529 MG/ML IV SOLN
18.0000 mL | Freq: Once | INTRAVENOUS | Status: AC | PRN
  Administered 2015-06-25: 18 mL via INTRAVENOUS

## 2015-06-26 ENCOUNTER — Telehealth: Payer: Self-pay | Admitting: *Deleted

## 2015-06-26 MED ORDER — FUROSEMIDE 20 MG PO TABS: 20.0000 mg | ORAL_TABLET | Freq: Every day | ORAL | Status: DC

## 2015-06-26 NOTE — Telephone Encounter (Signed)
[name], pt wife called to ask if it was ok to post pone chemo until after the first of the year. Please advise this is acceptable I will enter a POF for scheduling to call pt.

## 2015-06-26 NOTE — Telephone Encounter (Signed)
Spoke to [name]. She is concerned [name] is having an increased difficulty breathing and fatigue. She has asked if it would be possible to try lasix to help with edema. Pt has increased edema peripherally and in his abd. [name] has not been able to be home this week since caring for her mother. She has requested [facility] see pt again on Monday after radiation appt. Advised wife if SOB has increased we needed to see him sooner or ED. She stated this was difficult right now as her mother is actively dying with only 24-48 hours per hospice nurse. She does not want to leave her side. Wife is very tearful on the phone.

## 2015-06-26 NOTE — Telephone Encounter (Signed)
[name] has advised it is ok to hold chemo until after Jan 1. POF sent to scheduling.  [name] has advised to order Lasix 20 mg tablets. Script sent to [facility].  LM on wife cell phone for a rtn call to advise information above.

## 2015-06-27 ENCOUNTER — Telehealth: Payer: Self-pay | Admitting: *Deleted

## 2015-06-27 NOTE — Progress Notes (Signed)
Pt cancelled appt

## 2015-06-27 NOTE — Telephone Encounter (Signed)
Spoke to pt and wife. Pt has not started lasix yet. They are going to get medication today. Pt reports more periods of SOB. He is having difficulty laying down to sleep. Pt advised to go to ED if symptoms are getting worse. Pt wishes to wait until Monday. He admits to only doing neb treatments 2 times a day. Advised pt to complete a treatment as soon as we got off the phone.   Pt wife reports her mother passed away last night. She is coping as best she can. She is now able to dedicate more time to pt for his care.  POF sent for [facility] visit on Monday following radiation and labs.

## 2015-06-28 NOTE — Progress Notes (Signed)
  Radiation Oncology         [phone] ________________________________  Name: [name] MRN: [id number]  Date: 06/20/2015  DOB: Apr 15, 1941  End of Treatment Note  Diagnosis:   Metastatic non-small cell lung cancer     Indication for treatment::  palliative       Radiation treatment dates:   05/28/2015 through 06/20/2015  Site/dose:   The patient was treated to the right neck in a palliative manner to a dose of 35 gray in 14 fractions at 2.5 gray per fraction. This was treated using a 2 field, 3-D conformal technique. The patient required a re-simulation during his treatment.  Narrative: The patient tolerated radiation treatment relatively well.   The patient tolerated his treatment well without any unexpected difficulties.  Plan: The patient has completed radiation treatment. The patient will return to radiation oncology clinic for routine followup in one month. I advised the patient to call or return sooner if they have any questions or concerns related to their recovery or treatment. ________________________________  Jodelle Gross, M.D., Ph.D.

## 2015-06-30 ENCOUNTER — Ambulatory Visit: Payer: Medicare Other | Admitting: Physician Assistant

## 2015-06-30 ENCOUNTER — Ambulatory Visit: Payer: Medicare Other

## 2015-06-30 ENCOUNTER — Ambulatory Visit (HOSPITAL_BASED_OUTPATIENT_CLINIC_OR_DEPARTMENT_OTHER): Payer: Medicare Other | Admitting: Nurse Practitioner

## 2015-06-30 ENCOUNTER — Telehealth: Payer: Self-pay | Admitting: Nurse Practitioner

## 2015-06-30 ENCOUNTER — Encounter: Payer: Self-pay | Admitting: Radiation Oncology

## 2015-06-30 ENCOUNTER — Other Ambulatory Visit: Payer: Medicare Other

## 2015-06-30 ENCOUNTER — Other Ambulatory Visit (HOSPITAL_BASED_OUTPATIENT_CLINIC_OR_DEPARTMENT_OTHER): Payer: Medicare Other

## 2015-06-30 ENCOUNTER — Ambulatory Visit
Admission: RE | Admit: 2015-06-30 | Discharge: 2015-06-30 | Disposition: A | Discharge status: Home / Self Care | Payer: Medicare Other | Source: Ambulatory Visit | Attending: Radiation Oncology | Admitting: Radiation Oncology

## 2015-06-30 ENCOUNTER — Encounter: Payer: Self-pay | Admitting: Nurse Practitioner

## 2015-06-30 VITALS — BP 165/67 | HR 99 | Temp 97.7°F | Resp 22 | Ht 69.0 in

## 2015-06-30 VITALS — BP 164/70 | HR 81 | Temp 97.8°F | Resp 19 | Ht 69.0 in | Wt 183.0 lb

## 2015-06-30 DIAGNOSIS — R112 Nausea with vomiting, unspecified: Secondary | ICD-10-CM | POA: Diagnosis not present

## 2015-06-30 DIAGNOSIS — C3490 Malignant neoplasm of unspecified part of unspecified bronchus or lung: Secondary | ICD-10-CM | POA: Diagnosis present

## 2015-06-30 DIAGNOSIS — R600 Localized edema: Secondary | ICD-10-CM | POA: Diagnosis not present

## 2015-06-30 DIAGNOSIS — C3411 Malignant neoplasm of upper lobe, right bronchus or lung: Secondary | ICD-10-CM

## 2015-06-30 DIAGNOSIS — R601 Generalized edema: Secondary | ICD-10-CM

## 2015-06-30 DIAGNOSIS — R5381 Other malaise: Secondary | ICD-10-CM

## 2015-06-30 DIAGNOSIS — C770 Secondary and unspecified malignant neoplasm of lymph nodes of head, face and neck: Secondary | ICD-10-CM

## 2015-06-30 DIAGNOSIS — C3491 Malignant neoplasm of unspecified part of right bronchus or lung: Secondary | ICD-10-CM

## 2015-06-30 LAB — CBC WITH DIFFERENTIAL/PLATELET
BASO%: 0.6 % (ref 0.0–2.0)
BASOS ABS: 0.1 10*3/uL (ref 0.0–0.1)
EOS ABS: 0 10*3/uL (ref 0.0–0.5)
EOS%: 0 % (ref 0.0–7.0)
HEMATOCRIT: 32.8 % — AB (ref 38.4–49.9)
HGB: 10.5 g/dL — ABNORMAL LOW (ref 13.0–17.1)
LYMPH#: 2.2 10*3/uL (ref 0.9–3.3)
LYMPH%: 25.9 % (ref 14.0–49.0)
MCH: 31.3 pg (ref 27.2–33.4)
MCHC: 32.1 g/dL (ref 32.0–36.0)
MCV: 97.4 fL (ref 79.3–98.0)
MONO#: 1 10*3/uL — ABNORMAL HIGH (ref 0.1–0.9)
MONO%: 11 % (ref 0.0–14.0)
NEUT#: 5.4 10*3/uL (ref 1.5–6.5)
NEUT%: 62.5 % (ref 39.0–75.0)
PLATELETS: 140 10*3/uL (ref 140–400)
RBC: 3.36 10*6/uL — ABNORMAL LOW (ref 4.20–5.82)
RDW: 18.7 % — ABNORMAL HIGH (ref 11.0–14.6)
WBC: 8.6 10*3/uL (ref 4.0–10.3)

## 2015-06-30 LAB — COMPREHENSIVE METABOLIC PANEL
ALK PHOS: 82 U/L (ref 40–150)
ALT: 10 U/L (ref 0–55)
ANION GAP: 8 meq/L (ref 3–11)
AST: 15 U/L (ref 5–34)
Albumin: 3.3 g/dL — ABNORMAL LOW (ref 3.5–5.0)
BUN: 8.8 mg/dL (ref 7.0–26.0)
CALCIUM: 8.6 mg/dL (ref 8.4–10.4)
CHLORIDE: 103 meq/L (ref 98–109)
CO2: 28 mEq/L (ref 22–29)
Creatinine: 0.9 mg/dL (ref 0.7–1.3)
EGFR: 87 mL/min/{1.73_m2} — AB (ref >90)
Glucose: 70 mg/dl (ref 70–140)
POTASSIUM: 4 meq/L (ref 3.5–5.1)
Sodium: 139 mEq/L (ref 136–145)
Total Bilirubin: <0.3 mg/dL (ref 0.20–1.20)
Total Protein: 6.2 g/dL — ABNORMAL LOW (ref 6.4–8.3)

## 2015-06-30 NOTE — Assessment & Plan Note (Signed)
Patient states that his appetite remains strong; and he is able to both eat and drink with no difficulty.  However, he states that he does become nauseous and occasionally vomits in the evenings.  He has antinausea medication at home to take as directed.

## 2015-06-30 NOTE — Telephone Encounter (Signed)
per pof to sch pt appt-gave pt copy of avs °

## 2015-06-30 NOTE — Assessment & Plan Note (Signed)
Patient continues to report increased bilateral lower extremity peripheral edema; and also complains of some generalized edema to his face, neck, and abdomen.  The left lower extremity is slightly larger than the right is patient's baseline; and Doppler ultrasound of the left lower extremity was negative for DVT.  Patient has been taking Lasix 20 mg on a daily basis for the last few days with only minimal effectiveness.  Patient was advised to continue taking Lasix on a daily basis.  Potassium was within normal range today.  If patient continues on Lasix-will need to consider initiating potassium supplementation.

## 2015-06-30 NOTE — Progress Notes (Signed)
Radiation Oncology         [phone] ________________________________  Name: [name] MRN: [id number]  Date: 06/30/2015  DOB: Jan 19, 1941     Follow-Up Visit Note  CC: [name], MD  Diagnosis:   Metastatic non-small cell lung cancer     Indication for treatment:  palliative       Radiation treatment dates:   05/28/2015 through 06/20/2015, SRS  Narrative:  The patient returns today for routine follow-up.  Follow up s/p SRS Brain, here for results MRI done 06/25/15, patient has a congestive cough Greenish phelgm, on prednisone pack and antibiotic azithromycin, Also taking dayquill prn, no headache, wears oxygen 2 liters prn, sob with exertion. The patient states he experiences nausea and vomiting almost every time he eats, at least once a day.  Notes occasional swallowing problems due to swelling in upper throat.                               ALLERGIES:  has No Known Allergies.  Meds: Current Outpatient Prescriptions  Medication Sig Dispense Refill  . amLODipine (NORVASC) 10 MG tablet Takes half a tablet    . aspirin EC 81 MG tablet Take 81 mg by mouth daily.     . clonazePAM (KLONOPIN) 0.5 MG tablet Take 0.5 mg by mouth 2 (two) times daily as needed for anxiety. Takes one tablet by mouth two times daily as needed for anxiety.    . furosemide (LASIX) 20 MG tablet Take 1 tablet (20 mg total) by mouth daily. 30 tablet 0  . guaiFENesin (MUCINEX) 600 MG 12 hr tablet Take 1,200 mg by mouth 2 (two) times daily. Reported on 06/30/2015    . HYDROcodone-acetaminophen (NORCO) 7.5-325 MG tablet Take 1 tablet by mouth every 6 (six) hours as needed for moderate pain. (Patient not taking: Reported on 06/30/2015) 45 tablet 0  . ipratropium-albuterol (DUONEB) 0.5-2.5 (3) MG/3ML SOLN Take 3 mLs by nebulization 4 (four) times daily. (Patient taking differently: Take 3 mLs by nebulization 4 (four) times daily. Pt takes nebulizer 2 to 3 times daily.) 360 mL 2  .  isosorbide mononitrate (IMDUR) 30 MG 24 hr tablet Take 1 tablet (30 mg total) by mouth daily. 30 tablet 3  . lisinopril (PRINIVIL,ZESTRIL) 40 MG tablet Take 40 mg by mouth daily.    Marland Kitchen loratadine (CLARITIN) 10 MG tablet Take 10 mg by mouth daily. Reported on 06/30/2015    . magic mouthwash w/lidocaine SOLN Take 5 mLs by mouth 3 (three) times daily as needed for mouth pain. Reported on 06/30/2015    . metoprolol tartrate (LOPRESSOR) 25 MG tablet Take 25 mg by mouth 2 (two) times daily.     Marland Kitchen omeprazole (PRILOSEC) 40 MG capsule Take 40 mg by mouth daily.    . pramipexole (MIRAPEX) 1 MG tablet Take 1 mg by mouth at bedtime.    . prochlorperazine (COMPAZINE) 10 MG tablet Take 10 mg by mouth every 6 (six) hours as needed for nausea or vomiting. Reported on 06/30/2015    . traMADol (ULTRAM) 50 MG tablet Take 1 tablet (50 mg total) by mouth every 12 (twelve) hours as needed. (Patient not taking: Reported on 06/30/2015) 30 tablet 0  . zolpidem (AMBIEN) 10 MG tablet Take 10 mg by mouth at bedtime as needed for sleep. Reported on 06/30/2015     No current facility-administered medications for this encounter.   Facility-Administered Medications Ordered in  Other Encounters  Medication Dose Route Frequency Provider Last Rate Last Dose  . sodium chloride 0.9 % injection 10 mL  10 mL Intracatheter PRN Curt Bears, MD   10 mL at 01/29/15 4034    Physical Findings: The patient is in no acute distress. Patient is alert and oriented.  height is '5\' 9"'$  (1.753 m). His oral temperature is 97.7 F (36.5 C). His blood pressure is 165/67 and his pulse is 99. His respiration is 22 and oxygen saturation is 96%. .   Treatment area on neck is dry and irritated.   Lab Findings: Lab Results  Component Value Date   WBC 8.6 06/30/2015   HGB 10.5* 06/30/2015   HCT 32.8* 06/30/2015   MCV 97.4 06/30/2015   PLT 140 06/30/2015     Radiographic Findings: Ct Chest W Contrast  06/04/2015  CLINICAL DATA:  Status post  right upper lobe resection for lung cancer 12/13. Currently on chemotherapy and radiation therapy. Restaging. EXAM: CT CHEST, ABDOMEN, AND PELVIS WITH CONTRAST TECHNIQUE: Multidetector CT imaging of the chest, abdomen and pelvis was performed following the standard protocol during bolus administration of intravenous contrast. CONTRAST:  147m OMNIPAQUE IOHEXOL 300 MG/ML  SOLN COMPARISON:  04/04/2015 FINDINGS: CT CHEST FINDINGS Mediastinum/Nodes: No supraclavicular adenopathy. A right Port-A-Cath terminates at the low SVC. No axillary adenopathy. Aortic and branch vessel atherosclerosis. Normal heart size, without pericardial effusion. Multivessel coronary artery atherosclerosis. No central pulmonary embolism, on this non-dedicated study. No mediastinal or hilar adenopathy. A tiny hiatal hernia. Lungs/Pleura: Similar right-sided pleural thickening with new small right pleural effusion. Right upper lobectomy. Lower lobe predominant bronchial wall thickening. Fluid in the right mainstem bronchus. Moderate centrilobular emphysema. Further decrease in size of pulmonary nodules, without well-defined residual nodule identified. Mild scarring in the left lower lobe at the site of the nodule on prior exams. No lobar consolidation. Musculoskeletal: Posttraumatic or postsurgical defects of right-sided ribs. CT ABDOMEN PELVIS FINDINGS Hepatobiliary: Normal liver. Normal gallbladder, without biliary ductal dilatation. Pancreas: Normal, without mass or ductal dilatation. Spleen: Normal in size, without focal abnormality. Adrenals/Urinary Tract: Normal adrenal glands. No residual renal mass. Decreased excretion of contrast from the left kidney may relate to renal artery stenosis and resultant decreased inflow. Normal urinary bladder. Stomach/Bowel: Normal remainder of the stomach. Normal colon, appendix, and terminal ileum. Transverse duodenal diverticulum. Small bowel otherwise unremarkable. Vascular/Lymphatic: Advanced aortic  and branch vessel atherosclerosis. No abdominopelvic adenopathy. Reproductive: Normal prostate. Other: No significant free fluid. No evidence of omental or peritoneal disease. Musculoskeletal: Bilateral L5 pars defects. Mild convex left lumbar spine curvature. Advanced lumbosacral spondylosis. IMPRESSION: 1. Further response to therapy. No residual pulmonary, right adrenal, or right renal metastasis identified. 2. No sites of new or progressive disease. 3. Status post right upper lobectomy with centrilobular emphysema. 4. New trace right pleural fluid. 5.  Atherosclerosis, including within the coronary arteries. 6. Tiny hiatal hernia. 7. Suspect hemodynamically significant left renal artery atherosclerosis, as evidenced by decreased excretion of contrast from the left kidney on delayed images. Electronically Signed   By: KAbigail MiyamotoM.D.   On: 06/04/2015 12:18   Mr BJeri CosWVQContrast  06/25/2015  CLINICAL DATA:  Metastatic lung cancer. SRS to multiple brain metastases on 03/12/2015. EXAM: MRI HEAD WITHOUT AND WITH CONTRAST TECHNIQUE: Multiplanar, multiecho pulse sequences of the brain and surrounding structures were obtained without and with intravenous contrast. CONTRAST:  19mMULTIHANCE GADOBENATE DIMEGLUMINE 529 MG/ML IV SOLN COMPARISON:  02/28/2015 FINDINGS: The study is intermittently motion degraded by  patient's chronic cough. No definite acute infarct is identified. There is a punctate focus of increased diffusion-weighted signal in the right precentral gyrus without definite restricted diffusion on the ADC map, although evaluation is limited by its small size (series 3, image 70). No abnormality is identified in this location on conventional images, and there is no associated abnormal enhancement. No intracranial hemorrhage, midline shift, or extra-axial fluid collection is seen. There is mild generalized cerebral atrophy. Mild nonspecific T2 hyperintensities in the cerebral white matter bilaterally  are similar to the prior study. Enhancing right cerebellar lesion has decreased in size, now measuring 5 mm (series 10, image 20, previously 11 mm). Right parietal lesion has enlarged and now demonstrates lack of enhancement centrally consistent with necrosis, now measuring 9 mm and with new, mild surrounding edema (series 10, image 105, previously 4 mm). Left frontal enhancing lesion measures 4 mm and has not significantly changed in size allowing for motion artifact on both the current and prior study (series 10, image 103). There is a new 7 mm ring-enhancing lesion in the anteroinferior left frontal lobe (series 10, image 82). The punctate 2 mm left parietal metastasis on the prior study is no longer clearly identified. The inferior right frontal lobe lesion is also no longer identified. Prior right cataract extraction is noted. There is mild mucosal thickening in the maxillary sinuses, and there are small bilateral mastoid effusions. Major intracranial vascular flow voids are preserved. IMPRESSION: 1. Mixed interval changes. Right cerebellar lesion is smaller, and right frontal and left parietal lesions are no longer visualized. 2. Mild interval enlargement of right parietal lesion which is now centrally necrotic and with mild surrounding edema. This could reflect post treatment changes or mild progression. 3. New 7 mm inferior left frontal metastasis. 4. Punctate focus of diffusion signal abnormality in the posterior right frontal cortex. No definite restricted diffusion to indicate acute infarct and no abnormal enhancement to clearly indicate metastasis. Attention on follow-up. Electronically Signed   By: Logan Bores M.D.   On: 06/25/2015 15:03   Ct Abdomen Pelvis W Contrast  06/04/2015  CLINICAL DATA:  Status post right upper lobe resection for lung cancer 12/13. Currently on chemotherapy and radiation therapy. Restaging. EXAM: CT CHEST, ABDOMEN, AND PELVIS WITH CONTRAST TECHNIQUE: Multidetector CT  imaging of the chest, abdomen and pelvis was performed following the standard protocol during bolus administration of intravenous contrast. CONTRAST:  154m OMNIPAQUE IOHEXOL 300 MG/ML  SOLN COMPARISON:  04/04/2015 FINDINGS: CT CHEST FINDINGS Mediastinum/Nodes: No supraclavicular adenopathy. A right Port-A-Cath terminates at the low SVC. No axillary adenopathy. Aortic and branch vessel atherosclerosis. Normal heart size, without pericardial effusion. Multivessel coronary artery atherosclerosis. No central pulmonary embolism, on this non-dedicated study. No mediastinal or hilar adenopathy. A tiny hiatal hernia. Lungs/Pleura: Similar right-sided pleural thickening with new small right pleural effusion. Right upper lobectomy. Lower lobe predominant bronchial wall thickening. Fluid in the right mainstem bronchus. Moderate centrilobular emphysema. Further decrease in size of pulmonary nodules, without well-defined residual nodule identified. Mild scarring in the left lower lobe at the site of the nodule on prior exams. No lobar consolidation. Musculoskeletal: Posttraumatic or postsurgical defects of right-sided ribs. CT ABDOMEN PELVIS FINDINGS Hepatobiliary: Normal liver. Normal gallbladder, without biliary ductal dilatation. Pancreas: Normal, without mass or ductal dilatation. Spleen: Normal in size, without focal abnormality. Adrenals/Urinary Tract: Normal adrenal glands. No residual renal mass. Decreased excretion of contrast from the left kidney may relate to renal artery stenosis and resultant decreased inflow. Normal urinary bladder. Stomach/Bowel:  Normal remainder of the stomach. Normal colon, appendix, and terminal ileum. Transverse duodenal diverticulum. Small bowel otherwise unremarkable. Vascular/Lymphatic: Advanced aortic and branch vessel atherosclerosis. No abdominopelvic adenopathy. Reproductive: Normal prostate. Other: No significant free fluid. No evidence of omental or peritoneal disease.  Musculoskeletal: Bilateral L5 pars defects. Mild convex left lumbar spine curvature. Advanced lumbosacral spondylosis. IMPRESSION: 1. Further response to therapy. No residual pulmonary, right adrenal, or right renal metastasis identified. 2. No sites of new or progressive disease. 3. Status post right upper lobectomy with centrilobular emphysema. 4. New trace right pleural fluid. 5.  Atherosclerosis, including within the coronary arteries. 6. Tiny hiatal hernia. 7. Suspect hemodynamically significant left renal artery atherosclerosis, as evidenced by decreased excretion of contrast from the left kidney on delayed images. Electronically Signed   By: [name] M.D.   On: 06/04/2015 12:18   Ir Fluoro Guide Cv Line Right  06/03/2015  CLINICAL DATA:  74 year old male with a history of metastatic lung cancer. EXAM: IR RIGHT FLOURO GUIDE CV LINE; IR ULTRASOUND GUIDANCE VASC ACCESS RIGHT Date: 06/03/2015 ANESTHESIA/SEDATION: Moderate (conscious) sedation was administered during this procedure. A total of 1.5 mg Versed and 50 mg Fentanyl were administered intravenously. [name] vital signs were monitored continuously by radiology nursing throughout the course of the procedure. Total sedation time: 17 minutes FLUOROSCOPY TIME:  1 minutes 6 seconds TECHNIQUE: The right neck and chest was prepped with chlorhexidine, and draped in the usual sterile fashion using maximum barrier technique (cap and mask, sterile gown, sterile gloves, large sterile sheet, hand hygiene and cutaneous antiseptic). Antibiotic prophylaxis was provided with 2.0g Ancef administered IV one hour prior to skin incision. Local anesthesia was attained by infiltration with 1% lidocaine without epinephrine. Ultrasound demonstrated patency of the right internal jugular vein, and this was documented with an image. Under real-time ultrasound guidance, this vein was accessed with a 21 gauge micropuncture needle and image documentation was performed. A  small dermatotomy was made at the access site with an 11 scalpel. A 0.018" wire was advanced into the SVC and the access needle exchanged for a 28F micropuncture vascular sheath. The 0.018" wire was then removed and a 0.035" wire advanced into the IVC. An appropriate location for the subcutaneous reservoir was selected below the clavicle and an incision was made through the skin and underlying soft tissues. The subcutaneous tissues were then dissected using a combination of blunt and sharp surgical technique and a pocket was formed. A single lumen power injectable portacatheter was then tunneled through the subcutaneous tissues from the pocket to the dermatotomy and the port reservoir placed within the subcutaneous pocket. The venous access site was then serially dilated and a peel away vascular sheath placed over the wire. The wire was removed and the port catheter advanced into position under fluoroscopic guidance. The catheter tip is positioned in the upper right atrium. This was documented with a spot image. The portacatheter was then tested and found to flush and aspirate well. The port was flushed with saline followed by 100 units/mL heparinized saline. The pocket was then closed in two layers using first subdermal inverted interrupted absorbable sutures followed by a running subcuticular suture. The epidermis was then sealed with Dermabond. The dermatotomy at the venous access site was also closed with a single inverted subdermal suture and the epidermis sealed with Dermabond. COMPLICATIONS: None IMPRESSION: Status post placement of right IJ approach port catheter. Catheter ready for use. Signed, Dulcy Fanny. Earleen Newport, DO [facility]  Electronically Signed   By: Corrie Mckusick D.O.   On: 06/03/2015 10:57   Ir US Guide Vasc Access Right  06/03/2015  CLINICAL DATA:  74 year old male with a history of metastatic lung cancer. EXAM: IR RIGHT FLOURO GUIDE CV  LINE; IR ULTRASOUND GUIDANCE VASC ACCESS RIGHT Date: 06/03/2015 ANESTHESIA/SEDATION: Moderate (conscious) sedation was administered during this procedure. A total of 1.5 mg Versed and 50 mg Fentanyl were administered intravenously. [name] vital signs were monitored continuously by radiology nursing throughout the course of the procedure. Total sedation time: 17 minutes FLUOROSCOPY TIME:  1 minutes 6 seconds TECHNIQUE: The right neck and chest was prepped with chlorhexidine, and draped in the usual sterile fashion using maximum barrier technique (cap and mask, sterile gown, sterile gloves, large sterile sheet, hand hygiene and cutaneous antiseptic). Antibiotic prophylaxis was provided with 2.0g Ancef administered IV one hour prior to skin incision. Local anesthesia was attained by infiltration with 1% lidocaine without epinephrine. Ultrasound demonstrated patency of the right internal jugular vein, and this was documented with an image. Under real-time ultrasound guidance, this vein was accessed with a 21 gauge micropuncture needle and image documentation was performed. A small dermatotomy was made at the access site with an 11 scalpel. A 0.018" wire was advanced into the SVC and the access needle exchanged for a 31F micropuncture vascular sheath. The 0.018" wire was then removed and a 0.035" wire advanced into the IVC. An appropriate location for the subcutaneous reservoir was selected below the clavicle and an incision was made through the skin and underlying soft tissues. The subcutaneous tissues were then dissected using a combination of blunt and sharp surgical technique and a pocket was formed. A single lumen power injectable portacatheter was then tunneled through the subcutaneous tissues from the pocket to the dermatotomy and the port reservoir placed within the subcutaneous pocket. The venous access site was then serially dilated and a peel away vascular sheath placed over the wire. The wire was  removed and the port catheter advanced into position under fluoroscopic guidance. The catheter tip is positioned in the upper right atrium. This was documented with a spot image. The portacatheter was then tested and found to flush and aspirate well. The port was flushed with saline followed by 100 units/mL heparinized saline. The pocket was then closed in two layers using first subdermal inverted interrupted absorbable sutures followed by a running subcuticular suture. The epidermis was then sealed with Dermabond. The dermatotomy at the venous access site was also closed with a single inverted subdermal suture and the epidermis sealed with Dermabond. COMPLICATIONS: None IMPRESSION: Status post placement of right IJ approach port catheter. Catheter ready for use. Signed, Dulcy Fanny. Earleen Newport, DO [facility] Electronically Signed   By: Corrie Mckusick D.O.   On: 06/03/2015 10:57   Dg Abd Acute W/chest  06/20/2015  CLINICAL DATA:  74 year old male with history of distended abdomen. History of lung cancer. EXAM: DG ABDOMEN ACUTE W/ 1V CHEST COMPARISON:  CT the chest, abdomen and pelvis 06/04/2015. Chest CT 12/30/2014. FINDINGS: Postoperative changes of right upper lobectomy are again noted, with chronic right-sided volume loss, similar to the prior examination. No acute consolidative airspace disease. No pleural effusions. No suspicious appearing pulmonary nodules or masses are noted. Heart size is normal. Upper mediastinal contours are within normal limits. Atherosclerosis in the thoracic aorta. Right-sided internal jugular single-lumen power porta cath with tip terminating in the distal superior vena cava. Surgical clips along the right  chest wall. Postthoracotomy changes in several right ribs. Supine and upright views of the abdomen demonstrate a small amount of gas and stool throughout the colon. Several nondilated loops of gas-filled small bowel are noted in  the abdomen, predominantly in the right upper quadrant. No pneumoperitoneum. IMPRESSION: 1. Nonspecific but nonobstructive bowel gas pattern, as above. 2. No pneumoperitoneum. 3. Postoperative changes of prior right upper lobectomy redemonstrated, without evidence of acute cardiopulmonary disease. 4. Atherosclerosis. Electronically Signed   By: Vinnie Langton M.D.   On: 06/20/2015 12:20    Impression: Recent CT scan shows further response to therapy. No residual pulmonary, right adrenal, or right renal metastasis identified. However, brain MRI scan has revealed evidence of a 7 mm inferior left frontal metastasis. The patient would be appropriate to receive a one-time definitive radiation treatment to the tumor.    Plan:  We discussed the possible side effects and risks of treatment in addition to the possible benefits of treatment. We discussed the protocol for radiation treatment.  All of the patient's questions were answered. The patient does wish to proceed with this [place] reatment. A simulation will be scheduled such that we can proceed with treatment planning. I anticipate treating the new left inferior frontal tumor to a dose of 20 gray in 1 fraction.     ------------------------------------------------  Jodelle Gross, MD, PhD  This document serves as a record of services personally performed by Kyung Rudd, MD. It was created on his behalf by Derek Mound, a trained medical scribe. The creation of this record is based on the scribe's personal observations and the provider's statements to them. This document has been checked and approved by the attending provider.

## 2015-06-30 NOTE — Telephone Encounter (Signed)
per pof to sch pt appt-cld & spoke to pt and gave appt time & date-pt understood

## 2015-06-30 NOTE — Progress Notes (Signed)
SYMPTOM MANAGEMENT CLINIC   HPI: [name] 74 y.o. male diagnosed with lung cancer; with brain metastasis.  Currently undergoing docetaxel/Cyramza chemotherapy regimen.  Just completed his radiation treatments on 06/20/15. Patient received cycle 4 of his docetaxel/cyramza/Neulasta chemotherapy regimen on 06/16/2015. He is reporting some chronic nausea and intermittent episodes of vomitingl.  He reports that his chronic bilateral lower extremity edema has increased; and also feels he has some overall, generalized edema to his body and face as well. He also feels that his abdomen is edematous/distended and more firm.  He denies any abdominal discomfort whatsoever.  He also reports increased shortness of breath and wheezing since his last cycle of chemotherapy.  He typically wears oxygen at home 24/7 at 2 L nasal cannula.  He states that he has increased his neb txs to 3 times per day.  He denies any recent fevers or chills.  Blood counts obtained today reveal a WBC of 8.6, ANC 5.4, hemoglobin 10.5, platelet count 140.  See further notes for details of today's visit.  Patient completed his radiation treatments to the right side of his neck on 06/20/2015.  Patient feels that the right neck mass has slightly increased, however.  On exam.-Right neck with some mild anemia edema only, but no firm mass.    Patient underwent a brain MRI this past week for further staging.  Brain MRI revealed:  IMPRESSION: 1. Mixed interval changes. Right cerebellar lesion is smaller, and right frontal and left parietal lesions are no longer visualized. 2. Mild interval enlargement of right parietal lesion which is now centrally necrotic and with mild surrounding edema. This could reflect post treatment changes or mild progression. 3. New 7 mm inferior left frontal metastasis. 4. Punctate focus of diffusion signal abnormality in the posterior right frontal cortex. No definite restricted diffusion to  indicate acute infarct and no abnormal enhancement to clearly indicate metastasis. Attention on follow-up.   Patient met with Dr. Lisbeth Renshaw radiation oncologist earlier this morning for review of his brain MRI results.  Per patient-Dr. Lisbeth Renshaw plans to irradiate this new brain lesion.  Both patient and his wife are undergoing a great deal of stress at home now.  Patient's wife's mother passed away this past week; and will be buried tomorrow.  Patient is requesting to hold his chemotherapy for one week.  Will reschedule labs, visit, and next cycle of chemotherapy to the week of 07/14/2014.      HPI  ROS  Past Medical History  Diagnosis Date  . Arthritis   . Wheezing   . Sore throat   . Carotid artery occlusion   . COPD (chronic obstructive pulmonary disease) ([place])   . Lumbar disc disease   . Restless leg syndrome   . Allergic rhinitis   . Hypertension   . Anxiety     anxiety  . Shortness of breath   . Lung mass   . GERD (gastroesophageal reflux disease)   . H/O hiatal hernia   . Pre-operative cardiovascular examination   . Nonspecific abnormal unspecified cardiovascular function study   . Peripheral vascular disease, unspecified ([place])   . Pneumonia 2014    ?   Marland Kitchen On home oxygen therapy     prn  . Constipation   . Cancer ([place]) 09/14/12    invasive mod diff squamous cell ca  . 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 S/P radiation therapy 12/04/14 srs    lt frontal brain  Past Surgical History  Procedure Laterality Date  . Carotid endarterectomy  10/15/2010    left  . Hand surgery      repair of the left long, ring, and small finger after a saw injury  . Video bronchoscopy  07/24/2012    Procedure: VIDEO BRONCHOSCOPY WITH FLUORO;  Surgeon: Kathee Delton, MD;  Location: [facility];  Service: Cardiopulmonary;  Laterality: Bilateral;  . Lobectomy Right 09/14/2012    Procedure: LOBECTOMY;  Surgeon: Ivin Poot, MD;  Location: [facility];  Service: Thoracic;   Laterality: Right;  RIGHT UPPER LOBECTOMY  . Video assisted thoracoscopy Right 09/14/2012    Procedure: VIDEO ASSISTED THORACOSCOPY;  Surgeon: Ivin Poot, MD;  Location: [facility];  Service: Thoracic;  Laterality: Right;  . Video bronchoscopy with endobronchial ultrasound N/A 08/27/2014    Procedure: VIDEO BRONCHOSCOPY WITH ENDOBRONCHIAL ULTRASOUND;  Surgeon: Ivin Poot, MD;  Location: [facility] OR;  Service: Thoracic;  Laterality: N/A;  . [place] node biopsy Right 08/27/2014    Procedure: BIOPSY SCALENE NODE;  Surgeon: Ivin Poot, MD;  Location: [facility] OR;  Service: Thoracic;  Laterality: Right;  . Sterotactic radiosurgery Right 09/25/14    right parietal 30Gy/1 fx    has Occlusion and stenosis of carotid artery without mention of cerebral infarction; COPD (chronic obstructive pulmonary disease) ([place]); Lung mass; Pre-operative cardiovascular examination; Nonspecific abnormal unspecified cardiovascular function study; Peripheral vascular disease, unspecified ([place]); Lung cancer ([place]); Pain in limb; Coronary atherosclerosis of native coronary artery; Essential hypertension, benign; Tobacco use disorder; Carotid stenosis; Aftercare following surgery of the circulatory system; Weakness-Hand and  Legs; COPD exacerbation ([place]); Tachycardia; Dyspnea; Brain metastasis ([place]); Chemotherapy induced neutropenia ([place]); Renal stone; Pyelonephritis; Dehydration; UTI (lower urinary tract infection); Left arm cellulitis; Blood poisoning ([place]); Sepsis ([place]); Encounter for antineoplastic immunotherapy; Cancer associated pain; Rash; Syncope; Constipation; Insomnia; Hypokalemia; Peripheral edema; Extravasation, infusion or chemotherapeutic agent; Epistaxis; Metastatic lung cancer (metastasis from lung to other site) [facility]); Poor venous access; 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    has No Known Allergies.    Medication List       This list is accurate as of:  06/30/15 10:20 AM.  Always use your most recent med list.               amLODipine 10 MG tablet  Commonly known as:  NORVASC  Takes half a tablet     aspirin EC 81 MG tablet  Take 81 mg by mouth daily.     clonazePAM 0.5 MG tablet  Commonly known as:  KLONOPIN  Take 0.5 mg by mouth 2 (two) times daily as needed for anxiety. Takes one tablet by mouth two times daily as needed for anxiety.     furosemide 20 MG tablet  Commonly known as:  LASIX  Take 1 tablet (20 mg total) by mouth daily.     guaiFENesin 600 MG 12 hr tablet  Commonly known as:  MUCINEX  Take 1,200 mg by mouth 2 (two) times daily. Reported on 06/30/2015     HYDROcodone-acetaminophen 7.5-325 MG tablet  Commonly known as:  NORCO  Take 1 tablet by mouth every 6 (six) hours as needed for moderate pain.     ipratropium-albuterol 0.5-2.5 (3) MG/3ML Soln  Commonly known as:  DUONEB  Take 3 mLs by nebulization 4 (four) times daily.     isosorbide mononitrate 30 MG 24 hr tablet  Commonly known as:  IMDUR  Take 1 tablet (30 mg total) by mouth daily.     lisinopril 40 MG tablet  Commonly known as:  PRINIVIL,ZESTRIL  Take 40 mg by mouth daily.     loratadine 10 MG tablet  Commonly known as:  CLARITIN  Take 10 mg by mouth daily. Reported on 06/30/2015     magic mouthwash w/lidocaine Soln  Take 5 mLs by mouth 3 (three) times daily as needed for mouth pain. Reported on 06/30/2015     metoprolol tartrate 25 MG tablet  Commonly known as:  LOPRESSOR  Take 25 mg by mouth 2 (two) times daily.     omeprazole 40 MG capsule  Commonly known as:  PRILOSEC  Take 40 mg by mouth daily.     pramipexole 1 MG tablet  Commonly known as:  MIRAPEX  Take 1 mg by mouth at bedtime.     prochlorperazine 10 MG tablet  Commonly known as:  COMPAZINE  Take 10 mg by mouth every 6 (six) hours as needed for nausea or vomiting. Reported on 06/30/2015     traMADol 50 MG tablet  Commonly known as:  ULTRAM  Take 1 tablet (50 mg total)  by mouth every 12 (twelve) hours as needed.     zolpidem 10 MG tablet  Commonly known as:  AMBIEN  Take 10 mg by mouth at bedtime as needed for sleep. Reported on 06/30/2015         PHYSICAL EXAMINATION  Oncology Vitals 06/30/2015 06/30/2015  Height 175 cm 175 cm  Weight 83.008 kg -  Weight (lbs) 183 lbs -  BMI (kg/m2) 27.02 kg/m2 -  Temp 97.8 97.7  Pulse 81 99  Resp 19 22  SpO2 96 96  BSA (m2) 2.01 m2 -   BP Readings from Last 2 Encounters:  06/30/15 164/70  06/30/15 165/67    Physical Exam  Constitutional: He is oriented to person, place, and time. He appears unhealthy.  HENT:  Head: Normocephalic and atraumatic.  Mouth/Throat: Oropharynx is clear and moist.  Eyes: Conjunctivae and EOM are normal. Pupils are equal, round, and reactive to light. Right eye exhibits no discharge. Left eye exhibits no discharge. No scleral icterus.  Neck: Normal range of motion. Neck supple. No JVD present. No tracheal deviation present. No thyromegaly present.  Cardiovascular: Normal heart sounds and intact distal pulses.   Tachycardic.  Pulmonary/Chest: No respiratory distress. He has wheezes. He has no rales. He exhibits no tenderness.  Patient with wheezing to all lung fields.  No acute respiratory distress.  Abdominal: Soft. Bowel sounds are normal. He exhibits distension. He exhibits no mass. There is no tenderness. There is no rebound and no guarding.  Musculoskeletal: Normal range of motion. He exhibits edema. He exhibits no tenderness.  +2-3 edema to bilateral lower extremities; with the left slightly greater than the right.  Lymphadenopathy:    He has no cervical adenopathy.  Neurological: He is alert and oriented to person, place, and time.  Skin: Skin is warm and dry. No rash noted. No erythema. No pallor.  Patient has an approximately 1.5 cm in diameter raised lesion to his right medial lower leg.  Psychiatric: Affect normal.  Nursing note and vitals reviewed.   LABORATORY  DATA:. Appointment on 06/30/2015  Component Date Value Ref Range Status  . WBC 06/30/2015 8.6  4.0 - 10.3 10e3/uL Final  . NEUT# 06/30/2015 5.4  1.5 - 6.5 10e3/uL Final  . HGB 06/30/2015 10.5* 13.0 - 17.1 g/dL Final  . HCT 06/30/2015  32.8* 38.4 - 49.9 % Final  . Platelets 06/30/2015 140  140 - 400 10e3/uL Final  . MCV 06/30/2015 97.4  79.3 - 98.0 fL Final  . MCH 06/30/2015 31.3  27.2 - 33.4 pg Final  . MCHC 06/30/2015 32.1  32.0 - 36.0 g/dL Final  . RBC 06/30/2015 3.36* 4.20 - 5.82 10e6/uL Final  . RDW 06/30/2015 18.7* 11.0 - 14.6 % Final  . lymph# 06/30/2015 2.2  0.9 - 3.3 10e3/uL Final  . MONO# 06/30/2015 1.0* 0.1 - 0.9 10e3/uL Final  . Eosinophils Absolute 06/30/2015 0.0  0.0 - 0.5 10e3/uL Final  . Basophils Absolute 06/30/2015 0.1  0.0 - 0.1 10e3/uL Final  . NEUT% 06/30/2015 62.5  39.0 - 75.0 % Final  . LYMPH% 06/30/2015 25.9  14.0 - 49.0 % Final  . MONO% 06/30/2015 11.0  0.0 - 14.0 % Final  . EOS% 06/30/2015 0.0  0.0 - 7.0 % Final  . BASO% 06/30/2015 0.6  0.0 - 2.0 % Final  . Sodium 06/30/2015 139  136 - 145 mEq/L Final  . Potassium 06/30/2015 4.0  3.5 - 5.1 mEq/L Final  . Chloride 06/30/2015 103  98 - 109 mEq/L Final  . CO2 06/30/2015 28  22 - 29 mEq/L Final  . Glucose 06/30/2015 70  70 - 140 mg/dl Final   Glucose reference range is for nonfasting patients. Fasting glucose reference range is 70- 100.  Marland Kitchen BUN 06/30/2015 8.8  7.0 - 26.0 mg/dL Final  . Creatinine 06/30/2015 0.9  0.7 - 1.3 mg/dL Final  . Total Bilirubin 06/30/2015 <0.30  0.20 - 1.20 mg/dL Final  . Alkaline Phosphatase 06/30/2015 82  40 - 150 U/L Final  . AST 06/30/2015 15  5 - 34 U/L Final  . ALT 06/30/2015 10  0 - 55 U/L Final  . Total Protein 06/30/2015 6.2* 6.4 - 8.3 g/dL Final  . Albumin 06/30/2015 3.3* 3.5 - 5.0 g/dL Final  . Calcium 06/30/2015 8.6  8.4 - 10.4 mg/dL Final  . Anion Gap 06/30/2015 8  3 - 11 mEq/L Final  . EGFR 06/30/2015 87* >90 ml/min/1.73 m2 Final   eGFR is calculated using the CKD-EPI  Creatinine Equation (2009)   RADIOGRAPHIC STUDIES: No results found.  ASSESSMENT/PLAN:    Nausea with vomiting Patient states that his appetite remains strong; and he is able to both eat and drink with no difficulty.  However, he states that he does become nauseous and occasionally vomits in the evenings.  He has antinausea medication at home to take as directed.  Lung cancer Patient received cycle 4 of his docetaxel/cyramza/Neulasta chemotherapy regimen on 06/16/2015. He is reporting some chronic nausea and intermittent episodes of vomitingl.  He reports that his chronic bilateral lower extremity edema has increased; and also feels he has some overall, generalized edema to his body and face as well. He also feels that his abdomen is edematous/distended and more firm.  He denies any abdominal discomfort whatsoever.  He also reports increased shortness of breath and wheezing since his last cycle of chemotherapy.  He typically wears oxygen at home 24/7 at 2 L nasal cannula.  He states that he has increased his neb txs to 3 times per day.  He denies any recent fevers or chills.  Blood counts obtained today reveal a WBC of 8.6, ANC 5.4, hemoglobin 10.5, platelet count 140.  See further notes for details of today's visit.  Patient completed his radiation treatments to the right side of his neck on 06/20/2015.  Patient  feels that the right neck mass has slightly increased, however.  On exam.-Right neck with some mild anemia edema only, but no firm mass.    Patient underwent a brain MRI this past week for further staging.  Brain MRI revealed:  IMPRESSION: 1. Mixed interval changes. Right cerebellar lesion is smaller, and right frontal and left parietal lesions are no longer visualized. 2. Mild interval enlargement of right parietal lesion which is now centrally necrotic and with mild surrounding edema. This could reflect post treatment changes or mild progression. 3. New 7 mm inferior left frontal  metastasis. 4. Punctate focus of diffusion signal abnormality in the posterior right frontal cortex. No definite restricted diffusion to indicate acute infarct and no abnormal enhancement to clearly indicate metastasis. Attention on follow-up.   Patient met with Dr. Moody radiation oncologist earlier this morning for review of his brain MRI results.  Per patient-Dr. Moody plans to irradiate this new brain lesion.  Both patient and his wife are undergoing a great deal of stress at home now.  Patient's wife's mother passed away this past week; and will be buried tomorrow.  Patient is requesting to hold his chemotherapy for one week.  Will reschedule labs, visit, and next cycle of chemotherapy to the week of 07/14/2014.     Edema Patient continues to report increased bilateral lower extremity peripheral edema; and also complains of some generalized edema to his face, neck, and abdomen.  The left lower extremity is slightly larger than the right is patient's baseline; and Doppler ultrasound of the left lower extremity was negative for DVT.  Patient has been taking Lasix 20 mg on a daily basis for the last few days with only minimal effectiveness.  Patient was advised to continue taking Lasix on a daily basis.  Potassium was within normal range today.  If patient continues on Lasix-will need to consider initiating potassium supplementation.   Patient stated understanding of all instructions; and was in agreement with this plan of care. The patient knows to call the clinic with any problems, questions or concerns.   This was a shared visit with Dr. Mohamed today.  Total time spent with patient was 25 minutes;  with greater than 75 percent of that time spent in face to face counseling regarding patient's symptoms,  and coordination of care and follow up.  Disclaimer:This dictation was prepared with Dragon/digital dictation along with Smartphrase technology. Any transcriptional errors that result  from this process are unintentional.  Bacon, Cynthia, NP 06/30/2015   ADDENDUM: Hematology/Oncology Attending: I had a face to face encounter with the patient today. I recommended his care plan. This is a very pleasant 74 years old white male with metastatic non-small cell lung cancer, squamous cell carcinoma currently undergoing systemic chemotherapy with docetaxel and Cyramza status post 4 cycles. He has been treating his treatment well except for mild fatigue and nausea. Has been complaining recently of increasing cough and shortness of breath. His last CT scan of the chest, abdomen and pelvis showed no evidence for disease progression. The patient and his wife would like for him to have a break off treatment during the holiday season. There has some death in the family that they need to take care of it. I recommended for the patient to take a break off the treatment during the holiday season and we will resume his treatment in early January 2017. His recent MRI of the brain showed new 7 mm inferior left frontal metastasis and the patient was seen by   Dr. Moody and expected to undergo stereotactic radiotherapy to this area soon. He was advised to call immediately if he has any concerning symptoms in the interval.  Disclaimer: This note was dictated with voice recognition software. Similar sounding words can inadvertently be transcribed and may be missed upon review. MOHAMED,MOHAMED K., MD 06/30/2015  

## 2015-06-30 NOTE — Assessment & Plan Note (Signed)
Patient received cycle 4 of his docetaxel/cyramza/Neulasta chemotherapy regimen on 06/16/2015. He is reporting some chronic nausea and intermittent episodes of vomitingl.  He reports that his chronic bilateral lower extremity edema has increased; and also feels he has some overall, generalized edema to his body and face as well. He also feels that his abdomen is edematous/distended and more firm.  He denies any abdominal discomfort whatsoever.  He also reports increased shortness of breath and wheezing since his last cycle of chemotherapy.  He typically wears oxygen at home 24/7 at 2 L nasal cannula.  He states that he has increased his neb txs to 3 times per day.  He denies any recent fevers or chills.  Blood counts obtained today reveal a WBC of 8.6, ANC 5.4, hemoglobin 10.5, platelet count 140.  See further notes for details of today's visit.  Patient completed his radiation treatments to the right side of his neck on 06/20/2015.  Patient feels that the right neck mass has slightly increased, however.  On exam.-Right neck with some mild anemia edema only, but no firm mass.    Patient underwent a brain MRI this past week for further staging.  Brain MRI revealed:  IMPRESSION: 1. Mixed interval changes. Right cerebellar lesion is smaller, and right frontal and left parietal lesions are no longer visualized. 2. Mild interval enlargement of right parietal lesion which is now centrally necrotic and with mild surrounding edema. This could reflect post treatment changes or mild progression. 3. New 7 mm inferior left frontal metastasis. 4. Punctate focus of diffusion signal abnormality in the posterior right frontal cortex. No definite restricted diffusion to indicate acute infarct and no abnormal enhancement to clearly indicate metastasis. Attention on follow-up.   Patient met with Dr. Lisbeth Renshaw radiation oncologist earlier this morning for review of his brain MRI results.  Per patient-Dr. Lisbeth Renshaw plans  to irradiate this new brain lesion.  Both patient and his wife are undergoing a great deal of stress at home now.  Patient's wife's mother passed away this past week; and will be buried tomorrow.  Patient is requesting to hold his chemotherapy for one week.  Will reschedule labs, visit, and next cycle of chemotherapy to the week of 07/14/2014.

## 2015-06-30 NOTE — Progress Notes (Signed)
Follow up s/p SDRS [name], here for results MRI done 06/25/15, patient has a congestive cough  Greenish phelgm, on prednisone pack and antibiotic azithromycin,  Also taking dayquill prn, no head ache or nausea, wears oxygen 2 liters prn, sob with exertion 8:32 AM BP 165/67 mmHg  Pulse 99  Temp(Src) 97.7 F (36.5 C) (Oral)  Resp 22  Ht '5\' 9"'$  (1.753 m)  SpO2 96%  Wt Readings from Last 3 Encounters:  06/19/15 186 lb 11.2 oz (84.687 kg)  06/13/15 182 lb 6.4 oz (82.736 kg)  06/09/15 182 lb 14.4 oz (82.963 kg)

## 2015-07-04 ENCOUNTER — Ambulatory Visit
Admission: RE | Admit: 2015-07-04 | Discharge: 2015-07-04 | Disposition: A | Discharge status: Home / Self Care | Payer: Medicare Other | Source: Ambulatory Visit | Attending: Radiation Oncology | Admitting: Radiation Oncology

## 2015-07-04 DIAGNOSIS — Z510 Encounter for antineoplastic radiation therapy: Secondary | ICD-10-CM | POA: Diagnosis not present

## 2015-07-04 DIAGNOSIS — C7931 Secondary malignant neoplasm of brain: Secondary | ICD-10-CM

## 2015-07-04 DIAGNOSIS — C770 Secondary and unspecified malignant neoplasm of lymph nodes of head, face and neck: Secondary | ICD-10-CM

## 2015-07-04 MED ORDER — SODIUM CHLORIDE 0.9 % IJ SOLN
10.0000 mL | Freq: Once | INTRAMUSCULAR | Status: AC
  Administered 2015-07-04: 10 mL via INTRAVENOUS

## 2015-07-04 MED ORDER — HEPARIN SOD (PORK) LOCK FLUSH 100 UNIT/ML IV SOLN
500.0000 [IU] | Freq: Once | INTRAVENOUS | Status: AC
  Administered 2015-07-04: 500 [IU] via INTRAVENOUS

## 2015-07-04 NOTE — Progress Notes (Signed)
  Radiation Oncology         [phone] ________________________________  Name: [name] MRN: [id number]  Date: 07/04/2015  DOB: 01-26-1941  SIMULATION AND TREATMENT PLANNING NOTE   DIAGNOSIS: Metastatic non-small cell lung cancer    ICD-9-CM ICD-10-CM   1. Brain metastasis ([place]) 198.3 C79.31      NARRATIVE:  The patient was brought to the [facility].  Identity was confirmed.  All relevant records and images related to the planned course of therapy were reviewed.  The patient freely provided informed written consent to proceed with treatment after reviewing the details related to the planned course of therapy. The consent form was witnessed and verified by the simulation staff. Intravenous access was established for contrast administration. Then, the patient was set-up in a stable reproducible supine position for radiation therapy.  A relocatable thermoplastic stereotactic head frame was fabricated for precise immobilization.  CT images were obtained.  Surface markings were placed.  The CT images were loaded into the planning software and fused with the patient's targeting MRI scan.  Then the target and avoidance structures were contoured.  Treatment planning then occurred.  The radiation prescription was entered and confirmed.  I have requested 3D planning  I have requested a DVH of the following structures: Brain stem, brain, left eye, right eye, lenses, optic chiasm, target volumes, uninvolved brain, and normal tissue.    SPECIAL TREATMENT PROCEDURE:  The planned course of therapy using radiation constitutes a special treatment procedure. Special care is required in the management of this patient for the following reasons. This treatment constitutes a Special Treatment Procedure for the following reason: High dose per fraction requiring special monitoring for increased toxicities of treatment including daily imaging.  The special nature of the planned course of  radiotherapy will require increased physician supervision and oversight to ensure patient's safety with optimal treatment outcomes.  PLAN:  The patient will receive 20 Gy in 1 fraction.  ________________________________  Jodelle Gross, MD, PhD  This document serves as a record of services personally performed by Kyung Rudd, MD. It was created on his behalf by Darcus Austin, a trained medical scribe. The creation of this record is based on the scribe's personal observations and the provider's statements to them. This document has been checked and approved by the attending provider.

## 2015-07-04 NOTE — Progress Notes (Signed)
Pt gave name and dob ,wearing oxygen 2 liters n/c, has a power port atch right subclavian, labs drawn, 06/30/15, BUN=8.8, Cr=0.9, not diabetic not allergic to IV Dye, asked Samantha Presnell Rn to access  8:08 AM

## 2015-07-04 NOTE — Progress Notes (Signed)
Received patient in clinic following simulation. Flushed port per protocol with saline and heparin. Deaccessed port. Needle intact upon removal. Applied a bandaid to old access site. Patient tolerated well. Patient discharged home ambulatory without needs or complaints.

## 2015-07-08 ENCOUNTER — Ambulatory Visit: Payer: Medicare Other | Admitting: Physician Assistant

## 2015-07-08 ENCOUNTER — Ambulatory Visit: Payer: Medicare Other

## 2015-07-08 ENCOUNTER — Other Ambulatory Visit: Payer: Medicare Other

## 2015-07-15 ENCOUNTER — Ambulatory Visit: Payer: Medicare Other | Admitting: Physician Assistant

## 2015-07-15 ENCOUNTER — Other Ambulatory Visit: Payer: Medicare Other

## 2015-07-15 DIAGNOSIS — C7931 Secondary malignant neoplasm of brain: Secondary | ICD-10-CM | POA: Diagnosis present

## 2015-07-15 DIAGNOSIS — Z510 Encounter for antineoplastic radiation therapy: Secondary | ICD-10-CM | POA: Diagnosis present

## 2015-07-15 DIAGNOSIS — C3490 Malignant neoplasm of unspecified part of unspecified bronchus or lung: Secondary | ICD-10-CM | POA: Diagnosis present

## 2015-07-16 ENCOUNTER — Telehealth: Payer: Self-pay | Admitting: Internal Medicine

## 2015-07-16 ENCOUNTER — Encounter: Payer: Self-pay | Admitting: *Deleted

## 2015-07-16 ENCOUNTER — Other Ambulatory Visit (HOSPITAL_BASED_OUTPATIENT_CLINIC_OR_DEPARTMENT_OTHER): Payer: Medicare Other

## 2015-07-16 ENCOUNTER — Ambulatory Visit (HOSPITAL_BASED_OUTPATIENT_CLINIC_OR_DEPARTMENT_OTHER): Payer: Medicare Other | Admitting: Physician Assistant

## 2015-07-16 VITALS — BP 169/84 | HR 94 | Temp 98.0°F | Resp 23 | Ht 69.0 in | Wt 182.0 lb

## 2015-07-16 DIAGNOSIS — C3490 Malignant neoplasm of unspecified part of unspecified bronchus or lung: Secondary | ICD-10-CM

## 2015-07-16 DIAGNOSIS — C3411 Malignant neoplasm of upper lobe, right bronchus or lung: Secondary | ICD-10-CM

## 2015-07-16 DIAGNOSIS — C3491 Malignant neoplasm of unspecified part of right bronchus or lung: Secondary | ICD-10-CM

## 2015-07-16 DIAGNOSIS — C7931 Secondary malignant neoplasm of brain: Secondary | ICD-10-CM | POA: Diagnosis not present

## 2015-07-16 DIAGNOSIS — Z5111 Encounter for antineoplastic chemotherapy: Secondary | ICD-10-CM

## 2015-07-16 LAB — CBC WITH DIFFERENTIAL/PLATELET
BASO%: 0.8 % (ref 0.0–2.0)
Basophils Absolute: 0 10*3/uL (ref 0.0–0.1)
EOS%: 3.1 % (ref 0.0–7.0)
Eosinophils Absolute: 0.2 10*3/uL (ref 0.0–0.5)
HEMATOCRIT: 34 % — AB (ref 38.4–49.9)
HGB: 11.2 g/dL — ABNORMAL LOW (ref 13.0–17.1)
LYMPH#: 1.7 10*3/uL (ref 0.9–3.3)
LYMPH%: 30.1 % (ref 14.0–49.0)
MCH: 32 pg (ref 27.2–33.4)
MCHC: 32.8 g/dL (ref 32.0–36.0)
MCV: 97.7 fL (ref 79.3–98.0)
MONO#: 0.6 10*3/uL (ref 0.1–0.9)
MONO%: 11.3 % (ref 0.0–14.0)
NEUT#: 3 10*3/uL (ref 1.5–6.5)
NEUT%: 54.7 % (ref 39.0–75.0)
Platelets: 222 10*3/uL (ref 140–400)
RBC: 3.48 10*6/uL — AB (ref 4.20–5.82)
RDW: 18.7 % — ABNORMAL HIGH (ref 11.0–14.6)
WBC: 5.5 10*3/uL (ref 4.0–10.3)

## 2015-07-16 LAB — COMPREHENSIVE METABOLIC PANEL WITH GFR
ALT: <9 U/L (ref 0–55)
AST: 16 U/L (ref 5–34)
Albumin: 3.7 g/dL (ref 3.5–5.0)
Alkaline Phosphatase: 75 U/L (ref 40–150)
Anion Gap: 7 meq/L (ref 3–11)
BUN: 13 mg/dL (ref 7.0–26.0)
CO2: 27 meq/L (ref 22–29)
Calcium: 9.2 mg/dL (ref 8.4–10.4)
Chloride: 104 meq/L (ref 98–109)
Creatinine: 1.1 mg/dL (ref 0.7–1.3)
EGFR: 65 ml/min/1.73 m2 — ABNORMAL LOW
Glucose: 88 mg/dL (ref 70–140)
Potassium: 5.2 meq/L — ABNORMAL HIGH (ref 3.5–5.1)
Sodium: 138 meq/L (ref 136–145)
Total Bilirubin: 0.31 mg/dL (ref 0.20–1.20)
Total Protein: 7 g/dL (ref 6.4–8.3)

## 2015-07-16 NOTE — Telephone Encounter (Signed)
Spoke to wife to clarify Mr [name] is only to be taken prn as needed for tachy cardia translated to fast heart rate for understanding

## 2015-07-16 NOTE — Telephone Encounter (Signed)
Gave patient avs report and appointments for January thru March.  °

## 2015-07-16 NOTE — Progress Notes (Signed)
[place] [phone]   [phone]  OFFICE PROGRESS NOTE  [name], PA-C [place] [address]  DIAGNOSIS: Metastatic non-small cell lung cancer, squamous cell carcinoma initially diagnosed as Stage IIA (T2b., N0, M0) non-small cell lung cancer, squamous cell carcinoma diagnosed in December of 2013   PRIOR THERAPY:  1) Status post right upper lobectomy under the care of Dr. Prescott Gum on 09/14/2012.  2) Adjuvant chemotherapy with cisplatin 75 mg/M2 and docetaxel 75 mg/M2 with Neulasta support every 3 weeks, status post 1 cycle. Starting with cycle #2, patient will receive carboplatin for an AUC initially of 4.5 and paclitaxel at 175 mg per meter squared with Neulasta support given every 3 weeks. Status post a total of 3 cycles.  3) Systemic chemotherapy with carboplatin for AUC of 5 on day 1 and gemcitabine 1000 MG/M2 on days 1 and 8 every 3 weeks. First dose 09/11/2014. Status post 4 cycles. Discontinued secondary to intolerance 4) Immunotherapy with Nivolumab 3 MG/KG every 2 weeks. Status post 4 cycles. 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 Completed his radiation treatments on 06/20/15 6. new left inferior frontal tumor radiation on 07/04/15,  to a dose of 20 gray in 1 fraction, to be completed Friday 1/6  CURRENT THERAPY: Systemic chemotherapy with docetaxel 75 MG/M2 and Cyramza 10 MG/KG every 3 weeks with Neulasta support. Status post 4 cycles  CHEMOTHERAPY INTENT: Palliative. CURRENT # OF CHEMOTHERAPY CYCLES: 4 CURRENT ANTIEMETICS: Zofran, dexamethasone and Compazine  CURRENT SMOKING STATUS: Quit smoking recently.  ORAL CHEMOTHERAPY AND CONSENT: None  CURRENT BISPHOSPHONATES USE: None  PAIN MANAGEMENT: 3/10 controlled by Norco when necessary  NARCOTICS INDUCED CONSTIPATION: Milk of magnesia on as-needed basis.  LIVING WILL AND CODE STATUS: No CODE BLUE.   INTERVAL  HISTORY:  [name] 75 y.o. male returns to the clinic today for followup visit accompanied his wife. The patient is currently undergoing systemic chemotherapy with docetaxel and Cyramza status post 4 cycles and has been tolerating his treatment fairly well with no significant complaints except for fatigue for several days after the Neulasta injection. He has not recover from the last cycle of his treatment yet. He also continues to have shortness of breath and currently on home oxygen. He denied having any significant chest pain but has mild cough and no hemoptysis. He denied having any significant nausea or vomiting, no fever or chills, no weight loss or night sweats. In the interim he had a death in the family. The patient is here for follow up prior to planned Cycle 5 of chemotherapy.   MEDICAL HISTORY: Past Medical History  Diagnosis Date  . Arthritis   . Wheezing   . Sore throat   . Carotid artery occlusion   . COPD (chronic obstructive pulmonary disease) ([place])   . Lumbar disc disease   . Restless leg syndrome   . Allergic rhinitis   . Hypertension   . Anxiety     anxiety  . Shortness of breath   . Lung mass   . GERD (gastroesophageal reflux disease)   . H/O hiatal hernia   . Pre-operative cardiovascular examination   . Nonspecific abnormal unspecified cardiovascular function study   . Peripheral vascular disease, unspecified ([place])   . Pneumonia 2014    ?   Marland Kitchen On home oxygen therapy     prn  . Constipation   . Cancer ([place]) 09/14/12  invasive mod diff squamous cell ca  . 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 S/P radiation therapy 12/04/14 srs    lt frontal brain    ALLERGIES:  has No Known Allergies.  MEDICATIONS:  Current Outpatient Prescriptions  Medication Sig Dispense Refill  . amLODipine (NORVASC) 10 MG tablet Takes half a tablet    . aspirin EC 81 MG tablet Take 81 mg by mouth daily.     . clonazePAM (KLONOPIN) 0.5 MG tablet Take 0.5 mg by  mouth 2 (two) times daily as needed for anxiety. Takes one tablet by mouth two times daily as needed for anxiety.    . furosemide (LASIX) 20 MG tablet Take 1 tablet (20 mg total) by mouth daily. 30 tablet 0  . guaiFENesin (MUCINEX) 600 MG 12 hr tablet Take 1,200 mg by mouth 2 (two) times daily. Reported on 06/30/2015    . HYDROcodone-acetaminophen (NORCO) 7.5-325 MG tablet Take 1 tablet by mouth every 6 (six) hours as needed for moderate pain. (Patient not taking: Reported on 06/30/2015) 45 tablet 0  . ipratropium-albuterol (DUONEB) 0.5-2.5 (3) MG/3ML SOLN Take 3 mLs by nebulization 4 (four) times daily. (Patient taking differently: Take 3 mLs by nebulization 4 (four) times daily. Pt takes nebulizer 2 to 3 times daily.) 360 mL 2  . isosorbide mononitrate (IMDUR) 30 MG 24 hr tablet Take 1 tablet (30 mg total) by mouth daily. 30 tablet 3  . lisinopril (PRINIVIL,ZESTRIL) 40 MG tablet Take 40 mg by mouth daily.    Marland Kitchen loratadine (CLARITIN) 10 MG tablet Take 10 mg by mouth daily. Reported on 06/30/2015    . magic mouthwash w/lidocaine SOLN Take 5 mLs by mouth 3 (three) times daily as needed for mouth pain. Reported on 06/30/2015    . metoprolol tartrate (LOPRESSOR) 25 MG tablet Take 25 mg by mouth 2 (two) times daily.     Marland Kitchen omeprazole (PRILOSEC) 40 MG capsule Take 40 mg by mouth daily.    . pramipexole (MIRAPEX) 1 MG tablet Take 1 mg by mouth at bedtime.    . prochlorperazine (COMPAZINE) 10 MG tablet Take 10 mg by mouth every 6 (six) hours as needed for nausea or vomiting. Reported on 06/30/2015    . traMADol (ULTRAM) 50 MG tablet Take 1 tablet (50 mg total) by mouth every 12 (twelve) hours as needed. (Patient not taking: Reported on 06/30/2015) 30 tablet 0  . zolpidem (AMBIEN) 10 MG tablet Take 10 mg by mouth at bedtime as needed for sleep. Reported on 06/30/2015     No current facility-administered medications for this visit.   Facility-Administered Medications Ordered in Other Visits  Medication Dose  Route Frequency Provider Last Rate Last Dose  . sodium chloride 0.9 % injection 10 mL  10 mL Intracatheter PRN Curt Bears, MD   10 mL at 01/29/15 0944    SURGICAL HISTORY:  Past Surgical History  Procedure Laterality Date  . Carotid endarterectomy  10/15/2010    left  . Hand surgery      repair of the left long, ring, and small finger after a saw injury  . Video bronchoscopy  07/24/2012    Procedure: VIDEO BRONCHOSCOPY WITH FLUORO;  Surgeon: Kathee Delton, MD;  Location: [facility];  Service: Cardiopulmonary;  Laterality: Bilateral;  . Lobectomy Right 09/14/2012    Procedure: LOBECTOMY;  Surgeon: Ivin Poot, MD;  Location: [facility];  Service: Thoracic;  Laterality: Right;  RIGHT UPPER LOBECTOMY  . Video assisted thoracoscopy  Right 09/14/2012    Procedure: VIDEO ASSISTED THORACOSCOPY;  Surgeon: Ivin Poot, MD;  Location: [facility];  Service: Thoracic;  Laterality: Right;  . Video bronchoscopy with endobronchial ultrasound N/A 08/27/2014    Procedure: VIDEO BRONCHOSCOPY WITH ENDOBRONCHIAL ULTRASOUND;  Surgeon: Ivin Poot, MD;  Location: [facility];  Service: Thoracic;  Laterality: N/A;  . Scalene node biopsy Right 08/27/2014    Procedure: BIOPSY SCALENE NODE;  Surgeon: Ivin Poot, MD;  Location: [facility];  Service: Thoracic;  Laterality: Right;  . Sterotactic radiosurgery Right 09/25/14    right parietal 30Gy/1 fx    REVIEW OF SYSTEMS:  Constitutional: positive for fatigue Eyes: positive for visual disturbance Ears, nose, mouth, throat, and face: negative Respiratory: positive for cough and dyspnea on exertion Cardiovascular: negative Gastrointestinal: negative Genitourinary:negative Integument/breast: negative Hematologic/lymphatic: negative Musculoskeletal:negative Neurological: negative Behavioral/Psych: negative Endocrine: negative Allergic/Immunologic: negative   PHYSICAL EXAMINATION: General appearance: alert, cooperative and no distress Head: Normocephalic, without  obvious abnormality, atraumatic Neck: no adenopathy, no JVD, supple, symmetrical, trachea midline and thyroid not enlarged, symmetric, no tenderness/mass/nodules Lymph nodes: Cervical, supraclavicular, and axillary nodes normal. Resp: wheezes bilaterally Back: symmetric, no curvature. ROM normal. No CVA tenderness. Cardio: regular rate and rhythm, S1, S2 normal, no murmur, click, rub or gallop. Port normal GI: soft, non-tender; bowel sounds normal; no masses,  no organomegaly Extremities: extremities normal, atraumatic, no cyanosis or edema. Dry skin Neurologic: Alert and oriented X 3, normal strength and tone. Normal symmetric reflexes. Normal coordination and gait  ECOG PERFORMANCE STATUS: 1 - Symptomatic but completely ambulatory  There were no vitals taken for this visit.  LABORATORY DATA: CBC Latest Ref Rng 07/16/2015 06/30/2015 06/20/2015  WBC 4.0 - 10.3 10e3/uL 5.5 8.6 21.3(H)  Hemoglobin 13.0 - 17.1 g/dL 11.2(L) 10.5(L) 10.3(L)  Hematocrit 38.4 - 49.9 % 34.0(L) 32.8(L) 32.6(L)  Platelets 140 - 400 10e3/uL 222 140 128(L)   CMP Latest Ref Rng 06/30/2015 06/20/2015 06/16/2015  Glucose 70 - 140 mg/dl 70 92 93  BUN 7.0 - 26.0 mg/dL 8.8 16.7 13.7  Creatinine 0.7 - 1.3 mg/dL 0.9 1.0 1.1  Sodium 136 - 145 mEq/L 139 138 140  Potassium 3.5 - 5.1 mEq/L 4.0 3.9 4.9  Chloride 101 - 111 mmol/L - - -  CO2 22 - 29 mEq/L '28 23 26  '$ Calcium 8.4 - 10.4 mg/dL 8.6 8.6 9.3  Total Protein 6.4 - 8.3 g/dL 6.2(L) 6.8 6.6  Total Bilirubin 0.20 - 1.20 mg/dL <0.30 0.61 <0.30  Alkaline Phos 40 - 150 U/L 82 87 66  AST 5 - 34 U/L '15 17 14  '$ ALT 0 - 55 U/L 10 <9 <9     RADIOGRAPHIC STUDIES: Mr Kizzie Fantasia Contrast  07-02-2015  CLINICAL DATA:  Metastatic lung cancer. SRS to multiple brain metastases on 03/12/2015. EXAM: MRI HEAD WITHOUT AND WITH CONTRAST TECHNIQUE: Multiplanar, multiecho pulse sequences of the brain and surrounding structures were obtained without and with intravenous contrast. CONTRAST:   22m MULTIHANCE GADOBENATE DIMEGLUMINE 529 MG/ML IV SOLN COMPARISON:  02/28/2015 FINDINGS: The study is intermittently motion degraded by patient's chronic cough. No definite acute infarct is identified. There is a punctate focus of increased diffusion-weighted signal in the right precentral gyrus without definite restricted diffusion on the ADC map, although evaluation is limited by its small size (series 3, image 70). No abnormality is identified in this location on conventional images, and there is no associated abnormal enhancement. No intracranial hemorrhage, midline shift, or extra-axial fluid collection is seen. There is  mild generalized cerebral atrophy. Mild nonspecific T2 hyperintensities in the cerebral white matter bilaterally are similar to the prior study. Enhancing right cerebellar lesion has decreased in size, now measuring 5 mm (series 10, image 20, previously 11 mm). Right parietal lesion has enlarged and now demonstrates lack of enhancement centrally consistent with necrosis, now measuring 9 mm and with new, mild surrounding edema (series 10, image 105, previously 4 mm). Left frontal enhancing lesion measures 4 mm and has not significantly changed in size allowing for motion artifact on both the current and prior study (series 10, image 103). There is a new 7 mm ring-enhancing lesion in the anteroinferior left frontal lobe (series 10, image 82). The punctate 2 mm left parietal metastasis on the prior study is no longer clearly identified. The inferior right frontal lobe lesion is also no longer identified. Prior right cataract extraction is noted. There is mild mucosal thickening in the maxillary sinuses, and there are small bilateral mastoid effusions. Major intracranial vascular flow voids are preserved. IMPRESSION: 1. Mixed interval changes. Right cerebellar lesion is smaller, and right frontal and left parietal lesions are no longer visualized. 2. Mild interval enlargement of right parietal  lesion which is now centrally necrotic and with mild surrounding edema. This could reflect post treatment changes or mild progression. 3. New 7 mm inferior left frontal metastasis. 4. Punctate focus of diffusion signal abnormality in the posterior right frontal cortex. No definite restricted diffusion to indicate acute infarct and no abnormal enhancement to clearly indicate metastasis. Attention on follow-up. Electronically Signed   By: Logan Bores M.D.   On: 06/25/2015 15:03   Dg Abd Acute W/chest  06/20/2015  CLINICAL DATA:  75 year old male with history of distended abdomen. History of lung cancer. EXAM: DG ABDOMEN ACUTE W/ 1V CHEST COMPARISON:  CT the chest, abdomen and pelvis 06/04/2015. Chest CT 12/30/2014. FINDINGS: Postoperative changes of right upper lobectomy are again noted, with chronic right-sided volume loss, similar to the prior examination. No acute consolidative airspace disease. No pleural effusions. No suspicious appearing pulmonary nodules or masses are noted. Heart size is normal. Upper mediastinal contours are within normal limits. Atherosclerosis in the thoracic aorta. Right-sided internal jugular single-lumen power porta cath with tip terminating in the distal superior vena cava. Surgical clips along the right chest wall. Postthoracotomy changes in several right ribs. Supine and upright views of the abdomen demonstrate a small amount of gas and stool throughout the colon. Several nondilated loops of gas-filled small bowel are noted in the abdomen, predominantly in the right upper quadrant. No pneumoperitoneum. IMPRESSION: 1. Nonspecific but nonobstructive bowel gas pattern, as above. 2. No pneumoperitoneum. 3. Postoperative changes of prior right upper lobectomy redemonstrated, without evidence of acute cardiopulmonary disease. 4. Atherosclerosis. Electronically Signed   By: Vinnie Langton M.D.   On: 06/20/2015 12:20    ASSESSMENT AND PLAN: This is a very pleasant 75 years old white  male with history of stage IIA non-small cell lung cancer status post right upper lobectomy followed by 4 cycles of adjuvant chemotherapy.  The patient underwent systemic chemotherapy with carboplatin and gemcitabine status post 4 cycles discontinued secondary to intolerance and mild disease progression.  The patient completed treatment with Nivolumab status post 4 cycles but this was discontinued secondary to disease progression. He also recently completed stereotactic radiotherapy to metastatic brain lesions. The patient is currently undergoing systemic chemotherapy with docetaxel and Cyramza status post 4 cycles and has been tolerating his treatment well except for the aching pain after  Neulasta injection. Recent CT scan shows further response to therapy. He finished his radiation treatments However, brain MRI scan has revealed evidence of a 7 mm inferior left frontal metastasis He received 1 treatment, one-time definitive radiation treatment to the tumor.  Recommended for him to continue his current treatment with docetaxel and Cyramza next week. He would come back for follow-up visit in one 1 week with the start of cycle #6. He was advised to call immediately if he has any concerning symptoms in the interval.  The patient voices understanding of current disease status and treatment options and is in agreement with the current care plan.  All questions were answered. The patient knows to call the clinic with any problems, questions or concerns. We can certainly see the patient much sooner if necessary.     Rondel Jumbo, PA-C  07/16/2015  ADDENDUM: Hematology/Oncology Attending: I had a face to face encounter with the patient. I recommended his care plan. This is a very pleasant 75 years old white male with metastatic non-small cell lung cancer, squamous cell carcinoma currently undergoing systemic chemotherapy with docetaxel and Cyramza status post 4 cycles. The patient requested a break  off chemotherapy during the holiday and also to take care of his wife after her mother passed away a few weeks ago. He is feeling fine today was no specific complaints except for the baseline fatigue and shortness breath with exertion. I recommended for the patient to resume his systemic chemotherapy with docetaxel and Cyramza next week. He would come back for follow-up visit in one month's for reevaluation before starting cycle #6. The patient was advised to call immediately if he has any concerning symptoms in the interval.  Disclaimer: This note was dictated with voice recognition software. Similar sounding words can inadvertently be transcribed and may be missed upon review.  Eilleen Kempf., MD 07/20/2015

## 2015-07-18 ENCOUNTER — Ambulatory Visit
Admission: RE | Admit: 2015-07-18 | Discharge: 2015-07-18 | Disposition: A | Discharge status: Home / Self Care | Payer: Medicare Other | Source: Ambulatory Visit | Attending: Radiation Oncology | Admitting: Radiation Oncology

## 2015-07-18 DIAGNOSIS — Z510 Encounter for antineoplastic radiation therapy: Secondary | ICD-10-CM | POA: Diagnosis not present

## 2015-07-18 DIAGNOSIS — C7931 Secondary malignant neoplasm of brain: Secondary | ICD-10-CM

## 2015-07-18 NOTE — Progress Notes (Signed)
Patient lewft without coming to be monitored after his SRS treatment 9:35 AM

## 2015-07-18 NOTE — Progress Notes (Signed)
  Radiation Oncology         [id number]) [phone] ________________________________  Name: [name] MRN: [id number]  Date: 07/18/2015  DOB: 12-16-1940   SPECIAL TREATMENT PROCEDURE   3D TREATMENT PLANNING AND DOSIMETRY: [name] radiation plan was reviewed and approved by Dr. Saintclair Halsted from neurosurgery and radiation oncology prior to treatment. It showed 3-dimensional radiation distributions overlaid onto the planning CT/MRI image set. The [facility] for the target structures as well as the organs at risk were reviewed. The documentation of the 3D plan and dosimetry are filed in the radiation oncology EMR.   NARRATIVE: The patient was brought to the TrueBeam stereotactic radiation treatment machine and placed supine on the CT couch. The head frame was applied, and the patient was set up for stereotactic radiosurgery. Neurosurgery was present for the set-up and delivery   SIMULATION VERIFICATION: In the couch zero-angle position, the patient underwent Exactrac imaging using the Brainlab system with orthogonal KV images. These were carefully aligned and repeated to confirm treatment position for each of the isocenters. The Exactrac snap film verification was repeated at each couch angle.   SPECIAL TREATMENT PROCEDURE: The patient received stereotactic radiosurgery to the following target:  PTV6 Inf Left Frontal 49m target was treated using 4 Arcs to a prescription dose of 20 Gy. ExacTrac Snap verification was performed for each couch angle.   STEREOTACTIC TREATMENT MANAGEMENT: Following delivery, the patient was transported to nursing in stable condition and monitored for possible acute effects. Vital signs were recorded . The patient tolerated treatment without significant acute effects, and was discharged to home in stable condition.  PLAN: Follow-up in one month.   ------------------------------------------------  JJodelle Gross MD, PhD

## 2015-07-18 NOTE — Progress Notes (Signed)
  Radiation Oncology         [id number]) [id number] ________________________________  Name: [name] MRN: [id number]  Date: 07/18/2015  DOB: 1941/06/21  End of Treatment Note  Diagnosis:   Metastatic lung cancer    ICD-9-CM ICD-10-CM   1. Brain metastasis ([place]) 198.3 C79.31         Indication for treatment:  palliative       Radiation treatment dates:   07/18/15  Site/dose:    PTV6 Inf Left Frontal 18m target was treated using 4 Arcs to a prescription dose of 20 Gy. ExacTrac Snap verification was performed for each couch angle.  Narrative: The patient tolerated radiation treatment well.   There were no signs of acute toxicity after treatment.  Plan: The patient has completed radiation treatment. The patient will return to radiation oncology clinic for routine followup in one month. I advised the patient to call or return sooner if they have any questions or concerns related to their recovery or treatment. ________________________________  ------------------------------------------------  JJodelle Gross MD, PhD

## 2015-07-20 DIAGNOSIS — Z5111 Encounter for antineoplastic chemotherapy: Secondary | ICD-10-CM | POA: Insufficient documentation

## 2015-07-23 ENCOUNTER — Other Ambulatory Visit (HOSPITAL_BASED_OUTPATIENT_CLINIC_OR_DEPARTMENT_OTHER): Payer: Medicare Other

## 2015-07-23 ENCOUNTER — Ambulatory Visit (HOSPITAL_BASED_OUTPATIENT_CLINIC_OR_DEPARTMENT_OTHER): Payer: Medicare Other

## 2015-07-23 VITALS — BP 102/70 | HR 85 | Temp 97.6°F | Resp 19

## 2015-07-23 DIAGNOSIS — Z5111 Encounter for antineoplastic chemotherapy: Secondary | ICD-10-CM | POA: Diagnosis not present

## 2015-07-23 DIAGNOSIS — C3490 Malignant neoplasm of unspecified part of unspecified bronchus or lung: Secondary | ICD-10-CM

## 2015-07-23 DIAGNOSIS — C3491 Malignant neoplasm of unspecified part of right bronchus or lung: Secondary | ICD-10-CM

## 2015-07-23 DIAGNOSIS — Z5112 Encounter for antineoplastic immunotherapy: Secondary | ICD-10-CM

## 2015-07-23 LAB — CBC WITH DIFFERENTIAL/PLATELET
BASO%: 0.8 % (ref 0.0–2.0)
Basophils Absolute: 0 10*3/uL (ref 0.0–0.1)
EOS%: 4.5 % (ref 0.0–7.0)
Eosinophils Absolute: 0.2 10*3/uL (ref 0.0–0.5)
HCT: 35.5 % — ABNORMAL LOW (ref 38.4–49.9)
HGB: 11.2 g/dL — ABNORMAL LOW (ref 13.0–17.1)
LYMPH%: 35.7 % (ref 14.0–49.0)
MCH: 31.7 pg (ref 27.2–33.4)
MCHC: 31.5 g/dL — ABNORMAL LOW (ref 32.0–36.0)
MCV: 100.6 fL — ABNORMAL HIGH (ref 79.3–98.0)
MONO#: 0.5 10*3/uL (ref 0.1–0.9)
MONO%: 10.5 % (ref 0.0–14.0)
NEUT%: 48.5 % (ref 39.0–75.0)
NEUTROS ABS: 2.4 10*3/uL (ref 1.5–6.5)
PLATELETS: 194 10*3/uL (ref 140–400)
RBC: 3.53 10*6/uL — AB (ref 4.20–5.82)
RDW: 16.8 % — ABNORMAL HIGH (ref 11.0–14.6)
WBC: 4.9 10*3/uL (ref 4.0–10.3)
lymph#: 1.7 10*3/uL (ref 0.9–3.3)

## 2015-07-23 LAB — COMPREHENSIVE METABOLIC PANEL
AST: 14 U/L (ref 5–34)
Albumin: 3.4 g/dL — ABNORMAL LOW (ref 3.5–5.0)
Alkaline Phosphatase: 73 U/L (ref 40–150)
Anion Gap: 7 mEq/L (ref 3–11)
BILIRUBIN TOTAL: 0.37 mg/dL (ref 0.20–1.20)
BUN: 9.9 mg/dL (ref 7.0–26.0)
CHLORIDE: 102 meq/L (ref 98–109)
CO2: 29 meq/L (ref 22–29)
CREATININE: 0.9 mg/dL (ref 0.7–1.3)
Calcium: 9.1 mg/dL (ref 8.4–10.4)
EGFR: 84 mL/min/{1.73_m2} — ABNORMAL LOW (ref >90)
GLUCOSE: 84 mg/dL (ref 70–140)
Potassium: 4.1 mEq/L (ref 3.5–5.1)
SODIUM: 138 meq/L (ref 136–145)
TOTAL PROTEIN: 6.6 g/dL (ref 6.4–8.3)

## 2015-07-23 LAB — UA PROTEIN, DIPSTICK - CHCC: Protein, ur: NEGATIVE mg/dL

## 2015-07-23 MED ORDER — RAMUCIRUMAB CHEMO INJECTION 500 MG/50ML
10.0000 mg/kg | Freq: Once | INTRAVENOUS | Status: AC
  Administered 2015-07-23: 800 mg via INTRAVENOUS
  Filled 2015-07-23: qty 50

## 2015-07-23 MED ORDER — DEXAMETHASONE SODIUM PHOSPHATE 100 MG/10ML IJ SOLN
Freq: Once | INTRAMUSCULAR | Status: AC
  Administered 2015-07-23: 14:00:00 via INTRAVENOUS
  Filled 2015-07-23: qty 4

## 2015-07-23 MED ORDER — ACETAMINOPHEN 325 MG PO TABS
ORAL_TABLET | ORAL | Status: AC
  Filled 2015-07-23: qty 2

## 2015-07-23 MED ORDER — DIPHENHYDRAMINE HCL 50 MG/ML IJ SOLN
50.0000 mg | Freq: Once | INTRAMUSCULAR | Status: AC
  Administered 2015-07-23: 50 mg via INTRAVENOUS

## 2015-07-23 MED ORDER — SODIUM CHLORIDE 0.9 % IJ SOLN
10.0000 mL | INTRAMUSCULAR | Status: DC | PRN
  Administered 2015-07-23: 10 mL
  Filled 2015-07-23: qty 10

## 2015-07-23 MED ORDER — HEPARIN SOD (PORK) LOCK FLUSH 100 UNIT/ML IV SOLN
500.0000 [IU] | Freq: Once | INTRAVENOUS | Status: AC | PRN
  Administered 2015-07-23: 500 [IU]
  Filled 2015-07-23: qty 5

## 2015-07-23 MED ORDER — SODIUM CHLORIDE 0.9 % IV SOLN
Freq: Once | INTRAVENOUS | Status: AC
  Administered 2015-07-23: 13:00:00 via INTRAVENOUS

## 2015-07-23 MED ORDER — DOCETAXEL CHEMO INJECTION 160 MG/16ML
75.0000 mg/m2 | Freq: Once | INTRAVENOUS | Status: AC
  Administered 2015-07-23: 150 mg via INTRAVENOUS
  Filled 2015-07-23: qty 15

## 2015-07-23 MED ORDER — ACETAMINOPHEN 325 MG PO TABS
650.0000 mg | ORAL_TABLET | Freq: Once | ORAL | Status: AC
  Administered 2015-07-23: 650 mg via ORAL

## 2015-07-23 MED ORDER — DIPHENHYDRAMINE HCL 50 MG/ML IJ SOLN
INTRAMUSCULAR | Status: AC
  Filled 2015-07-23: qty 1

## 2015-07-23 NOTE — Patient Instructions (Signed)
[facility] Discharge Instructions for Patients Receiving Chemotherapy  Today you received the following chemotherapy agents cyramza and taxotere   If you develop nausea and vomiting that is not controlled by your nausea medication, call the clinic.   BELOW ARE SYMPTOMS THAT SHOULD BE REPORTED IMMEDIATELY:  *FEVER GREATER THAN 100.5 F  *CHILLS WITH OR WITHOUT FEVER  NAUSEA AND VOMITING THAT IS NOT CONTROLLED WITH YOUR NAUSEA MEDICATION  *UNUSUAL SHORTNESS OF BREATH  *UNUSUAL BRUISING OR BLEEDING  TENDERNESS IN MOUTH AND THROAT WITH OR WITHOUT PRESENCE OF ULCERS  *URINARY PROBLEMS  *BOWEL PROBLEMS  UNUSUAL RASH Items with * indicate a potential emergency and should be followed up as soon as possible.  Feel free to call the clinic you have any questions or concerns. The clinic phone number is [phone].  Please show the [place] at check-in to the Emergency Department and triage nurse.

## 2015-07-24 ENCOUNTER — Ambulatory Visit: Payer: Medicare Other

## 2015-07-24 NOTE — CV Procedure (Signed)
  Name: [name]  MRN: [id number]  Date: 07/18/2015   DOB: Sep 16, 1940  Stereotactic Radiosurgery Operative Note  PRE-OPERATIVE DIAGNOSIS:  Solitary Brain Metastasis  POST-OPERATIVE DIAGNOSIS:  Solitary Brain Metastasis  PROCEDURE:  Stereotactic Radiosurgery  SURGEON:  Latondra Gebhart P, MD  NARRATIVE: The patient underwent a radiation treatment planning session in the radiation oncology simulation suite under the care of the radiation oncology physician and physicist.  I participated closely in the radiation treatment planning afterwards. The patient underwent planning CT which was fused to 3T high resolution MRI with 1 mm axial slices.  These images were fused on the planning system.  We contoured the gross target volumes and subsequently expanded this to yield the Planning Target Volume. I actively participated in the planning process.  I helped to define and review the target contours and also the contours of the optic pathway, eyes, brainstem and selected nearby organs at risk.  All the dose constraints for critical structures were reviewed and compared to AAPM Task Group 101.  The prescription dose conformity was reviewed.  I approved the plan electronically.    Accordingly, [name] was brought to the TrueBeam stereotactic radiation treatment linac and placed in the custom immobilization mask.  The patient was aligned according to the IR fiducial markers with BrainLab Exactrac, then orthogonal x-rays were used in ExacTrac with the 6DOF robotic table and the shifts were made to align the patient  [name] received stereotactic radiosurgery uneventfully.    The detailed description of the procedure is recorded in the radiation oncology procedure note.  I was present for the duration of the procedure.  DISPOSITION:  Following delivery, the patient was transported to nursing in stable condition and monitored for possible acute effects to be discharged to home in stable condition  with follow-up in one month.  Mayar Whittier P, MD 07/24/2015 1:26 PM

## 2015-07-25 ENCOUNTER — Ambulatory Visit: Payer: Medicare Other | Admitting: Radiation Oncology

## 2015-07-25 ENCOUNTER — Ambulatory Visit (HOSPITAL_BASED_OUTPATIENT_CLINIC_OR_DEPARTMENT_OTHER): Payer: Medicare Other

## 2015-07-25 VITALS — BP 151/68 | HR 96 | Temp 98.3°F

## 2015-07-25 DIAGNOSIS — C3490 Malignant neoplasm of unspecified part of unspecified bronchus or lung: Secondary | ICD-10-CM

## 2015-07-25 DIAGNOSIS — Z5189 Encounter for other specified aftercare: Secondary | ICD-10-CM

## 2015-07-25 DIAGNOSIS — C3491 Malignant neoplasm of unspecified part of right bronchus or lung: Secondary | ICD-10-CM

## 2015-07-25 MED ORDER — PEGFILGRASTIM INJECTION 6 MG/0.6ML ~~LOC~~
6.0000 mg | PREFILLED_SYRINGE | Freq: Once | SUBCUTANEOUS | Status: AC
  Administered 2015-07-25: 6 mg via SUBCUTANEOUS
  Filled 2015-07-25: qty 0.6

## 2015-07-28 ENCOUNTER — Telehealth: Payer: Self-pay | Admitting: *Deleted

## 2015-07-28 ENCOUNTER — Telehealth: Payer: Self-pay | Admitting: Internal Medicine

## 2015-07-28 NOTE — Telephone Encounter (Signed)
per pof to sch pt appt-per pof pt aware

## 2015-07-28 NOTE — Telephone Encounter (Signed)
Called patient after message received from scheduler- patient spoke with RT Navigator. Is SOB and having pitting edema.   Pt states he "does this sometimes after chemo", is on O2 3 liters. Is SOB. Currently on lasix. Pt will come in on Tuesday @ 0930 for labs, [place], NP. To go to the ED if gets worse.

## 2015-07-29 ENCOUNTER — Telehealth: Payer: Self-pay | Admitting: *Deleted

## 2015-07-29 ENCOUNTER — Encounter: Payer: Self-pay | Admitting: Nurse Practitioner

## 2015-07-29 ENCOUNTER — Ambulatory Visit (HOSPITAL_COMMUNITY)
Admission: RE | Admit: 2015-07-29 | Discharge: 2015-07-29 | Disposition: A | Discharge status: Home / Self Care | Payer: Medicare Other | Source: Ambulatory Visit | Attending: Nurse Practitioner | Admitting: Nurse Practitioner

## 2015-07-29 ENCOUNTER — Ambulatory Visit (HOSPITAL_BASED_OUTPATIENT_CLINIC_OR_DEPARTMENT_OTHER): Payer: Medicare Other | Admitting: Nurse Practitioner

## 2015-07-29 ENCOUNTER — Other Ambulatory Visit (HOSPITAL_BASED_OUTPATIENT_CLINIC_OR_DEPARTMENT_OTHER): Payer: Medicare Other

## 2015-07-29 ENCOUNTER — Encounter (HOSPITAL_COMMUNITY): Payer: Self-pay

## 2015-07-29 VITALS — BP 186/89 | HR 100 | Temp 98.4°F | Resp 20 | Ht 69.0 in | Wt 187.3 lb

## 2015-07-29 DIAGNOSIS — C3491 Malignant neoplasm of unspecified part of right bronchus or lung: Secondary | ICD-10-CM

## 2015-07-29 DIAGNOSIS — Z923 Personal history of irradiation: Secondary | ICD-10-CM | POA: Diagnosis not present

## 2015-07-29 DIAGNOSIS — R062 Wheezing: Secondary | ICD-10-CM | POA: Diagnosis present

## 2015-07-29 DIAGNOSIS — C7931 Secondary malignant neoplasm of brain: Secondary | ICD-10-CM | POA: Diagnosis not present

## 2015-07-29 DIAGNOSIS — R05 Cough: Secondary | ICD-10-CM | POA: Diagnosis not present

## 2015-07-29 DIAGNOSIS — C3410 Malignant neoplasm of upper lobe, unspecified bronchus or lung: Secondary | ICD-10-CM

## 2015-07-29 DIAGNOSIS — C3490 Malignant neoplasm of unspecified part of unspecified bronchus or lung: Secondary | ICD-10-CM

## 2015-07-29 DIAGNOSIS — R0602 Shortness of breath: Secondary | ICD-10-CM | POA: Diagnosis present

## 2015-07-29 DIAGNOSIS — R5383 Other fatigue: Secondary | ICD-10-CM

## 2015-07-29 DIAGNOSIS — Z452 Encounter for adjustment and management of vascular access device: Secondary | ICD-10-CM | POA: Diagnosis not present

## 2015-07-29 DIAGNOSIS — J90 Pleural effusion, not elsewhere classified: Secondary | ICD-10-CM | POA: Diagnosis not present

## 2015-07-29 DIAGNOSIS — Z9889 Other specified postprocedural states: Secondary | ICD-10-CM | POA: Diagnosis not present

## 2015-07-29 DIAGNOSIS — R609 Edema, unspecified: Secondary | ICD-10-CM | POA: Diagnosis not present

## 2015-07-29 DIAGNOSIS — Z95828 Presence of other vascular implants and grafts: Secondary | ICD-10-CM

## 2015-07-29 LAB — CBC WITH DIFFERENTIAL/PLATELET
BASO%: 1.3 % (ref 0.0–2.0)
Basophils Absolute: 0 10*3/uL (ref 0.0–0.1)
EOS%: 3 % (ref 0.0–7.0)
Eosinophils Absolute: 0.1 10*3/uL (ref 0.0–0.5)
HCT: 32 % — ABNORMAL LOW (ref 38.4–49.9)
HGB: 10.1 g/dL — ABNORMAL LOW (ref 13.0–17.1)
LYMPH%: 35.7 % (ref 14.0–49.0)
MCH: 31.9 pg (ref 27.2–33.4)
MCHC: 31.6 g/dL — AB (ref 32.0–36.0)
MCV: 100.9 fL — ABNORMAL HIGH (ref 79.3–98.0)
MONO#: 0.2 10*3/uL (ref 0.1–0.9)
MONO%: 8.5 % (ref 0.0–14.0)
NEUT%: 51.5 % (ref 39.0–75.0)
NEUTROS ABS: 1.2 10*3/uL — AB (ref 1.5–6.5)
PLATELETS: 103 10*3/uL — AB (ref 140–400)
RBC: 3.17 10*6/uL — AB (ref 4.20–5.82)
RDW: 16.9 % — AB (ref 11.0–14.6)
WBC: 2.4 10*3/uL — AB (ref 4.0–10.3)
lymph#: 0.8 10*3/uL — ABNORMAL LOW (ref 0.9–3.3)
nRBC: 0 % (ref 0–0)

## 2015-07-29 LAB — COMPREHENSIVE METABOLIC PANEL
ALK PHOS: 79 U/L (ref 40–150)
ALT: 12 U/L (ref 0–55)
ANION GAP: 6 meq/L (ref 3–11)
AST: 16 U/L (ref 5–34)
Albumin: 3.3 g/dL — ABNORMAL LOW (ref 3.5–5.0)
BILIRUBIN TOTAL: 0.38 mg/dL (ref 0.20–1.20)
BUN: 12.4 mg/dL (ref 7.0–26.0)
CO2: 28 mEq/L (ref 22–29)
Calcium: 8.7 mg/dL (ref 8.4–10.4)
Chloride: 106 mEq/L (ref 98–109)
Creatinine: 0.9 mg/dL (ref 0.7–1.3)
EGFR: 82 mL/min/{1.73_m2} — ABNORMAL LOW (ref >90)
Glucose: 92 mg/dl (ref 70–140)
Potassium: 4.1 mEq/L (ref 3.5–5.1)
SODIUM: 140 meq/L (ref 136–145)
Total Protein: 6.6 g/dL (ref 6.4–8.3)

## 2015-07-29 MED ORDER — SODIUM CHLORIDE 0.9 % IJ SOLN
10.0000 mL | INTRAMUSCULAR | Status: DC | PRN
  Administered 2015-07-29: 10 mL via INTRAVENOUS
  Filled 2015-07-29: qty 10

## 2015-07-29 MED ORDER — HEPARIN SOD (PORK) LOCK FLUSH 100 UNIT/ML IV SOLN
500.0000 [IU] | Freq: Once | INTRAVENOUS | Status: AC
  Administered 2015-07-29: 500 [IU] via INTRAVENOUS
  Filled 2015-07-29: qty 5

## 2015-07-29 MED ORDER — IOHEXOL 350 MG/ML SOLN
100.0000 mL | Freq: Once | INTRAVENOUS | Status: AC | PRN
  Administered 2015-07-29: 100 mL via INTRAVENOUS

## 2015-07-29 MED ORDER — FUROSEMIDE 20 MG PO TABS: ORAL_TABLET | ORAL | Status: DC

## 2015-07-29 NOTE — Progress Notes (Signed)
SYMPTOM MANAGEMENT CLINIC   HPI: [name] 75 y.o. male diagnosed with lung cancer; with brain metastasis.  Currently undergoing Taxotere/cyramza chemotherapy therapy regimen.  Patient is status post SRS brain radiation on 07/18/2015.   Patient received cycle 5 of his Taxotere/cyramza chemotherapy regimen on 07/23/2015.  He received a Neulasta injection for growth factor support.  The day following his last chemotherapy.  Patient reports worsening respiratory issues within this past week.  He typically is on 2 L nasal cannula on a 24/7 basis.  He has had to increase his O2 up to 3 L on occasion this past weekend due to increase shortness of breath.  He continues with occasional cough and chronic wheezing.  He feels he has chronic thick secretions into the back of his throat.  He denies any specific chest pain, chest pressure, or pain with inspiration.  He has noted some increased bilateral lower extremity edema as well.  He has been taking the Lasix 20 mg on a daily basis.  Also, he continues with 4 different blood pressure medications.  He also denies any recent fevers or chills.  Patient also notes that his nose stays congested; so he has become much more of a mouth breather recently.  He states that the oxygen via nasal cannula irritates his naris.  Patient was switched with Venturi mask while at the [facility]; and appeared to be much more comfortable.  Labs obtained today did reveal a WBC of 2.4, ANC 1.2, hemoglobin 10.1, platelet count 103.  CT angiogram of the chest obtained today revealed:  IMPRESSION: 1. No pulmonary emboli. 2. Postsurgical and therapeutic changes in the right lung. Thickening of the proximal bronchi on the right may be post therapeutic or due to bronchitis. Recommend clinical correlation. 3. No evidence of recurrent malignancy. 4. Significant mucus in the trachea. 5. Bilateral pleural effusions are small but larger in the interval.  [name] and to  review CT results with the patient.  Patient was advised that he should follow-up with his pulmonologist Dr. Teressa Lower this week if at all possible.  Advised patient to increase his Lasix to 20 mg twice daily for a total of one week; and then to return to the Lasix 20 mg per day.  Also, will order Venturi mask for patient to use at home.  [place] nurse was able to obtain an appointment with pulmonologist for this coming Thursday, 07/31/2015, at 9 AM.  Patient was advised to call/return or go directly to the emergency department for any worsening symptoms whatsoever.  Patient is scheduled to return for labs only on 08/06/2015.  He is scheduled to return for labs, visit, and chemotherapy on 08/13/2015.  Note-confirmed that patient should continue with weekly labs due to his chemotherapy regimen.  HPI  Review of Systems  Respiratory: Positive for cough, sputum production, shortness of breath and wheezing. Negative for hemoptysis.   Cardiovascular: Negative for chest pain.  Gastrointestinal: Positive for nausea.  Neurological: Positive for weakness.  All other systems reviewed and are negative.   Past Medical History  Diagnosis Date  . Arthritis   . Wheezing   . Sore throat   . Carotid artery occlusion   . COPD (chronic obstructive pulmonary disease) ([place])   . Lumbar disc disease   . Restless leg syndrome   . Allergic rhinitis   . Hypertension   . Anxiety     anxiety  . Shortness of breath   . Lung mass   .  GERD (gastroesophageal reflux disease)   . H/O hiatal hernia   . Pre-operative cardiovascular examination   . Nonspecific abnormal unspecified cardiovascular function study   . Peripheral vascular disease, unspecified ([place])   . Pneumonia 2014    ?   Marland Kitchen On home oxygen therapy     prn  . Constipation   . Cancer ([place]) 09/14/12    invasive mod diff squamous cell ca  . 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 S/P radiation therapy 12/04/14 srs    lt  frontal brain    Past Surgical History  Procedure Laterality Date  . Carotid endarterectomy  10/15/2010    left  . Hand surgery      repair of the left long, ring, and small finger after a saw injury  . Video bronchoscopy  07/24/2012    Procedure: VIDEO BRONCHOSCOPY WITH FLUORO;  Surgeon: Kathee Delton, MD;  Location: [facility];  Service: Cardiopulmonary;  Laterality: Bilateral;  . Lobectomy Right 09/14/2012    Procedure: LOBECTOMY;  Surgeon: Ivin Poot, MD;  Location: [facility];  Service: Thoracic;  Laterality: Right;  RIGHT UPPER LOBECTOMY  . Video assisted thoracoscopy Right 09/14/2012    Procedure: VIDEO ASSISTED THORACOSCOPY;  Surgeon: Ivin Poot, MD;  Location: [facility];  Service: Thoracic;  Laterality: Right;  . Video bronchoscopy with endobronchial ultrasound N/A 08/27/2014    Procedure: VIDEO BRONCHOSCOPY WITH ENDOBRONCHIAL ULTRASOUND;  Surgeon: Ivin Poot, MD;  Location[facility];  Service: Thoracic;  Laterality: N/A;  . Scalene node biopsy Right 08/27/2014    Procedure: BIOPSY SCALENE NODE;  Surgeon: Ivin Poot, MD;  Location: [facility] OR;  Service: Thoracic;  Laterality: Right;  . Sterotactic radiosurgery Right 09/25/14    right parietal 30Gy/1 fx    has Occlusion and stenosis of carotid artery without mention of cerebral infarction; COPD (chronic obstructive pulmonary disease) ([place]); Lung mass; Pre-operative cardiovascular examination; Nonspecific abnormal unspecified cardiovascular function study; Peripheral vascular disease, unspecified ([place]); Lung cancer ([place]); Pain in limb; Coronary atherosclerosis of native coronary artery; Essential hypertension, benign; Tobacco use disorder; Carotid stenosis; Aftercare following surgery of the circulatory system; Weakness-Hand and  Legs; COPD exacerbation ([place]); Tachycardia; Dyspnea; Brain metastasis ([place]); Chemotherapy induced neutropenia ([place]); Renal stone; Pyelonephritis; Dehydration; UTI (lower urinary tract infection); Left arm  cellulitis; Blood poisoning ([place]); Sepsis ([place]); Encounter for antineoplastic immunotherapy; Cancer associated pain; Rash; Syncope; Constipation; Insomnia; Hypokalemia; Peripheral edema; Extravasation, infusion or chemotherapeutic agent; Epistaxis; Metastatic lung cancer (metastasis from lung to other site) [facility]); Poor venous access; 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    has No Known Allergies.    Medication List       This list is accurate as of: 07/29/15  5:34 PM.  Always use your most recent med list.               amLODipine 10 MG tablet  Commonly known as:  NORVASC  Takes half a tablet     aspirin EC 81 MG tablet  Take 81 mg by mouth daily.     clonazePAM 0.5 MG tablet  Commonly known as:  KLONOPIN  Take 0.5 mg by mouth 2 (two) times daily as needed for anxiety. Takes one tablet by mouth two times daily as needed for anxiety.     furosemide 20 MG tablet  Commonly known as:  LASIX  Change to Lasix 20 mg BID x 7 days;  then return to 20 mg Q AM.     guaiFENesin 600 MG 12 hr tablet  Commonly known as:  MUCINEX  Take 1,200 mg by mouth 2 (two) times daily. Reported on 06/30/2015     HYDROcodone-acetaminophen 7.5-325 MG tablet  Commonly known as:  NORCO  Take 1 tablet by mouth every 6 (six) hours as needed for moderate pain.     ipratropium-albuterol 0.5-2.5 (3) MG/3ML Soln  Commonly known as:  DUONEB  Take 3 mLs by nebulization 4 (four) times daily.     isosorbide mononitrate 30 MG 24 hr tablet  Commonly known as:  IMDUR  Take 1 tablet (30 mg total) by mouth daily.     lisinopril 40 MG tablet  Commonly known as:  PRINIVIL,ZESTRIL  Take 40 mg by mouth daily.     loratadine 10 MG tablet  Commonly known as:  CLARITIN  Take 10 mg by mouth daily. Reported on 06/30/2015     magic mouthwash w/lidocaine Soln  Take 5 mLs by mouth 3 (three) times daily as needed for mouth pain. Reported on  06/30/2015     metoprolol tartrate 25 MG tablet  Commonly known as:  LOPRESSOR  Take 25 mg by mouth 2 (two) times daily.     omeprazole 40 MG capsule  Commonly known as:  PRILOSEC  Take 40 mg by mouth daily.     pramipexole 1 MG tablet  Commonly known as:  MIRAPEX  Take 1 mg by mouth at bedtime.     prochlorperazine 10 MG tablet  Commonly known as:  COMPAZINE  Take 10 mg by mouth every 6 (six) hours as needed for nausea or vomiting. Reported on 06/30/2015     traMADol 50 MG tablet  Commonly known as:  ULTRAM  Take 1 tablet (50 mg total) by mouth every 12 (twelve) hours as needed.     zolpidem 10 MG tablet  Commonly known as:  AMBIEN  Take 10 mg by mouth at bedtime as needed for sleep. Reported on 06/30/2015         PHYSICAL EXAMINATION  Oncology Vitals 07/29/2015 07/25/2015  Height 175 cm -  Weight 84.959 kg -  Weight (lbs) 187 lbs 5 oz -  BMI (kg/m2) 27.66 kg/m2 -  Temp 98.4 98.3  Pulse 100 96  Resp 20 -  SpO2 98 98  BSA (m2) 2.03 m2 -   BP Readings from Last 2 Encounters:  07/29/15 186/89  07/25/15 151/68    Physical Exam  Constitutional: He is oriented to person, place, and time. He appears unhealthy.  HENT:  Head: Normocephalic and atraumatic.  Patient has some oral lesions to his bilateral lower gums.  No obvious thrush noted.  Eyes: Conjunctivae and EOM are normal. Pupils are equal, round, and reactive to light. Right eye exhibits no discharge. Left eye exhibits no discharge. No scleral icterus.  Neck: Normal range of motion. Neck supple. No JVD present. No tracheal deviation present. No thyromegaly present.  Cardiovascular: Normal rate, regular rhythm, normal heart sounds and intact distal pulses.   Pulmonary/Chest: No respiratory distress. He has wheezes. He has no rales. He exhibits no tenderness.  Patient has increased wheezing throughout all lung fields.  He is on 2 L nasal cannula.  Patient was then switched to a facemask for his comfort.  Patient  was offered, but refused breathing treatment while at the cancer center.  Abdominal: Soft. Bowel sounds are normal. He exhibits no distension and no mass. There is no tenderness.  There is no rebound and no guarding.  Musculoskeletal: Normal range of motion. He exhibits edema. He exhibits no tenderness.  +2-3 edema to bilateral lower extremity is.  No calf tenderness.  Lymphadenopathy:    He has no cervical adenopathy.  Neurological: He is alert and oriented to person, place, and time. Gait normal.  Skin: Skin is warm and dry. No rash noted. No erythema. There is pallor.  Psychiatric: Affect normal.  Nursing note and vitals reviewed.   LABORATORY DATA:. Appointment on 07/29/2015  Component Date Value Ref Range Status  . WBC 07/29/2015 2.4* 4.0 - 10.3 10e3/uL Final  . NEUT# 07/29/2015 1.2* 1.5 - 6.5 10e3/uL Final  . HGB 07/29/2015 10.1* 13.0 - 17.1 g/dL Final  . HCT 07/29/2015 32.0* 38.4 - 49.9 % Final  . Platelets 07/29/2015 103* 140 - 400 10e3/uL Final  . MCV 07/29/2015 100.9* 79.3 - 98.0 fL Final  . MCH 07/29/2015 31.9  27.2 - 33.4 pg Final  . MCHC 07/29/2015 31.6* 32.0 - 36.0 g/dL Final  . RBC 07/29/2015 3.17* 4.20 - 5.82 10e6/uL Final  . RDW 07/29/2015 16.9* 11.0 - 14.6 % Final  . lymph# 07/29/2015 0.8* 0.9 - 3.3 10e3/uL Final  . MONO# 07/29/2015 0.2  0.1 - 0.9 10e3/uL Final  . Eosinophils Absolute 07/29/2015 0.1  0.0 - 0.5 10e3/uL Final  . Basophils Absolute 07/29/2015 0.0  0.0 - 0.1 10e3/uL Final  . NEUT% 07/29/2015 51.5  39.0 - 75.0 % Final  . LYMPH% 07/29/2015 35.7  14.0 - 49.0 % Final  . MONO% 07/29/2015 8.5  0.0 - 14.0 % Final  . EOS% 07/29/2015 3.0  0.0 - 7.0 % Final  . BASO% 07/29/2015 1.3  0.0 - 2.0 % Final  . nRBC 07/29/2015 0  0 - 0 % Final  . Sodium 07/29/2015 140  136 - 145 mEq/L Final  . Potassium 07/29/2015 4.1  3.5 - 5.1 mEq/L Final  . Chloride 07/29/2015 106  98 - 109 mEq/L Final  . CO2 07/29/2015 28  22 - 29 mEq/L Final  . Glucose 07/29/2015 92  70 - 140  mg/dl Final   Glucose reference range is for nonfasting patients. Fasting glucose reference range is 70- 100.  Marland Kitchen BUN 07/29/2015 12.4  7.0 - 26.0 mg/dL Final  . Creatinine 07/29/2015 0.9  0.7 - 1.3 mg/dL Final  . Total Bilirubin 07/29/2015 0.38  0.20 - 1.20 mg/dL Final  . Alkaline Phosphatase 07/29/2015 79  40 - 150 U/L Final  . AST 07/29/2015 16  5 - 34 U/L Final  . ALT 07/29/2015 12  0 - 55 U/L Final  . Total Protein 07/29/2015 6.6  6.4 - 8.3 g/dL Final  . Albumin 07/29/2015 3.3* 3.5 - 5.0 g/dL Final  . Calcium 07/29/2015 8.7  8.4 - 10.4 mg/dL Final  . Anion Gap 07/29/2015 6  3 - 11 mEq/L Final  . EGFR 07/29/2015 82* >90 ml/min/1.73 m2 Final   eGFR is calculated using the CKD-EPI Creatinine Equation (2009)     RADIOGRAPHIC STUDIES: Ct Angio Chest Pe W/cm &/or Wo Cm  07/29/2015  CLINICAL DATA:  Shortness of breath for 3 days. History of right lung cancer with surgery in 2013. Ongoing chemotherapy. EXAM: CT ANGIOGRAPHY CHEST WITH CONTRAST TECHNIQUE: Multidetector CT imaging of the chest was performed using the standard protocol during bolus administration of intravenous contrast. Multiplanar CT image reconstructions and MIPs were obtained to evaluate the vascular anatomy. CONTRAST:  133m OMNIPAQUE IOHEXOL 350 MG/ML SOLN COMPARISON:  Chest x-ray June 20, 2015 and CT of the chest June 04, 2015 FINDINGS: There is debris layering posteriorly in the trachea, extending into the right mainstem bronchus, likely mucus. The left-sided airways are normal. Postsurgical changes are seen in the right-sided airways. Bronchial wall thickening is seen centrally on the right, new in the interval. This could be due to postradiation change or bronchitis. No pneumothorax. Platelike opacity in the right lung on series 8, image 40, consistent with atelectasis. Mild dependent atelectasis is seen. There is a small peripheral nodule in the left on series 8, image 22, similar in the interval given difference in slice  selection. Recommend attention on follow-up. No new pulmonary nodules. No pulmonary masses or suspicious infiltrates. The right-sided pleural effusion is larger in the interval but remains small. There is now a tiny left effusion which was not seen previously. A right Port-A-Cath is in good position. No adenopathy in the neck or bilateral axilla. No mediastinal or hilar adenopathy. Coronary artery calcifications again noted. No pericardial effusion or significant cardiomegaly. Atherosclerosis is seen in the non aneurysmal thoracic aorta. No dissection. No pulmonary emboli are identified. No acute abnormalities seen on limited views the upper abdomen. There is an old rib fracture on the right. No bony metastatic disease identified. Review of the MIP images confirms the above findings. IMPRESSION: 1. No pulmonary emboli. 2. Postsurgical and therapeutic changes in the right lung. Thickening of the proximal bronchi on the right may be post therapeutic or due to bronchitis. Recommend clinical correlation. 3. No evidence of recurrent malignancy. 4. Significant mucus in the trachea. 5. Bilateral pleural effusions are small but larger in the interval. Electronically Signed   By: Dorise Bullion III M.D   On: 07/29/2015 11:23    ASSESSMENT/PLAN:    Lung cancer Patient received cycle 5 of his Taxotere/cyramza chemotherapy regimen on 07/23/2015.  He received a Neulasta injection for growth factor support.  The day following his last chemotherapy.  Patient received SRS brain irradiation on 07/18/2015.  Patient reports worsening respiratory issues within this past week.  He typically is on 2 L nasal cannula on a 24/7 basis.  He has had to increase his O2 up to 3 L on occasion this past weekend due to increase shortness of breath.  He continues with occasional cough and chronic wheezing.  He feels he has chronic thick secretions into the back of his throat.  He denies any specific chest pain, chest pressure, or pain  with inspiration.  He has noted some increased bilateral lower extremity edema as well.  He has been taking the Lasix 20 mg on a daily basis.  Also, he continues with 4 different blood pressure medications.  He also denies any recent fevers or chills.  Patient also notes that his nose stays congested; so he has become much more of a mouth breather recently.  He states that the oxygen via nasal cannula irritates his naris.  Patient was switched with Venturi mask while at the [facility]; and appeared to be much more comfortable.  Labs obtained today did reveal a WBC of 2.4, ANC 1.2, hemoglobin 10.1, platelet count 103.  CT angiogram of the chest obtained today revealed:  IMPRESSION: 1. No pulmonary emboli. 2. Postsurgical and therapeutic changes in the right lung. Thickening of the proximal bronchi on the right may be post therapeutic or due to bronchitis. Recommend clinical correlation. 3. No evidence of recurrent malignancy. 4. Significant mucus in the trachea. 5. Bilateral pleural effusions are small but larger in  the interval.  [name] and to review CT results with the patient.  Patient was advised that he should follow-up with his pulmonologist Dr. Teressa Lower this week if at all possible.  Advised patient to increase his Lasix to 20 mg twice daily for a total of one week; and then to return to the Lasix 20 mg per day.  Also, will order Venturi mask for patient to use at home.  [place] nurse was able to obtain an appointment with pulmonologist for this coming Thursday, 07/31/2015, at 9 AM.  Patient was advised to call/return or go directly to the emergency department for any worsening symptoms whatsoever.  Patient is scheduled to return for labs only on 08/06/2015.  He is scheduled to return for labs, visit, and chemotherapy on 08/13/2015.  Note-confirmed that patient should continue with weekly labs due to his chemotherapy regimen.   Peripheral edema Patient  suffers with chronic bilateral lower extremity edema; but feels that his edema to his feet has increased within this past week.  He continues to take Lasix 20 mg on a daily basis.  He also takes 4 different blood pressure medications; some of which are diabetics as well.  Patient does have slight increased +2-3 edema to his bilateral lower extremities.  [name] has advised the patient increase his Lasix to 20 mg twice daily for a total of one week; and then to decrease back to his regular regimen of Lasix 20 mg on a daily basis.  Also, patient has plans to follow-up with his pulmonologist later this week.   Pt was offered- but refused neb treatment whiel at the cancer center. He has nebs at home; and states that he uses the nebs 2-3 times per day as needed.   Patient stated understanding of all instructions; and was in agreement with this plan of care. The patient knows to call the clinic with any problems, questions or concerns.   This was a shared visit with [name] today.  Total time spent with patient was 25 minutes;  with greater than 75 percent of that time spent in face to face counseling regarding patient's symptoms,  and coordination of care and follow up.  Disclaimer:This dictation was prepared with Dragon/digital dictation along with [employer]. Any transcriptional errors that result from this process are unintentional.  Drue Second, NP 07/29/2015   ADDENDUM: Hematology/Oncology Attending: I had a face to face encounter with the patient today. I recommended his care plan. This is a very pleasant 75 years old white male with metastatic non-small cell lung cancer, squamous cell carcinoma currently undergoing systemic chemotherapy with docetaxel and Cyramza status post 5 cycles. He'll receive cycle #5 last week. The patient has been complaining of increasing fatigue and weakness as well as shortness of breath and cough. He also has swelling of the lower  extremities. Will order a stat CT angiogram of the chest to evaluate his condition. It did not show any significant disease progression but the patient had significant mucus in the tracheal area in addition to a small right-sided pleural effusion. There is no evidence for pulmonary embolism or pneumonia. I recommended for the patient to see his pulmonologist Dr. Lenna Gilford for evaluation of the chronic COPD and excessive mucous production. We also advised the patient to increase his dose of Lasix to 20 mg by mouth twice a day with increase of his potassium supplements for the lower extremity edema.  He would come back for follow-up visit in 2 weeks for  reevaluation before starting cycle #6 of his treatment. The patient was advised to call immediately if he has any concerning symptoms in the interval.  Disclaimer: This note was dictated with voice recognition software. Similar sounding words can inadvertently be transcribed and may be missed upon review. Eilleen Kempf., MD 07/29/2015

## 2015-07-29 NOTE — Telephone Encounter (Signed)
Wife returned Sara's call. Notified of appt with Dr Leeanne Deed

## 2015-07-29 NOTE — Assessment & Plan Note (Addendum)
Patient received cycle 5 of his Taxotere/cyramza chemotherapy regimen on 07/23/2015.  He received a Neulasta injection for growth factor support.  The day following his last chemotherapy.  Patient received SRS brain irradiation on 07/18/2015.  Patient reports worsening respiratory issues within this past week.  He typically is on 2 L nasal cannula on a 24/7 basis.  He has had to increase his O2 up to 3 L on occasion this past weekend due to increase shortness of breath.  He continues with occasional cough and chronic wheezing.  He feels he has chronic thick secretions into the back of his throat.  He denies any specific chest pain, chest pressure, or pain with inspiration.  He has noted some increased bilateral lower extremity edema as well.  He has been taking the Lasix 20 mg on a daily basis.  Also, he continues with 4 different blood pressure medications.  He also denies any recent fevers or chills.  Patient also notes that his nose stays congested; so he has become much more of a mouth breather recently.  He states that the oxygen via nasal cannula irritates his naris.  Patient was switched with Venturi mask while at the [facility]; and appeared to be much more comfortable.  Labs obtained today did reveal a WBC of 2.4, ANC 1.2, hemoglobin 10.1, platelet count 103.  CT angiogram of the chest obtained today revealed:  IMPRESSION: 1. No pulmonary emboli. 2. Postsurgical and therapeutic changes in the right lung. Thickening of the proximal bronchi on the right may be post therapeutic or due to bronchitis. Recommend clinical correlation. 3. No evidence of recurrent malignancy. 4. Significant mucus in the trachea. 5. Bilateral pleural effusions are small but larger in the interval.  [name] and to review CT results with the patient.  Patient was advised that he should follow-up with his pulmonologist Dr. Teressa Lower this week if at all possible.  Advised patient to increase his Lasix to  20 mg twice daily for a total of one week; and then to return to the Lasix 20 mg per day.  Also, will order Venturi mask for patient to use at home.  [place] nurse was able to obtain an appointment with pulmonologist for this coming Thursday, 07/31/2015, at 9 AM.  Patient was advised to call/return or go directly to the emergency department for any worsening symptoms whatsoever.  Patient is scheduled to return for labs only on 08/06/2015.  He is scheduled to return for labs, visit, and chemotherapy on 08/13/2015.  Note-confirmed that patient should continue with weekly labs due to his chemotherapy regimen.

## 2015-07-29 NOTE — Telephone Encounter (Signed)
Per [name] urgent appt made with Dr. Lenna Gilford pulmonologist on Thursday at [place] on wife cell phone to advise appt. Will attempt to reach pt again to confirm appt time.

## 2015-07-29 NOTE — Assessment & Plan Note (Signed)
Patient suffers with chronic bilateral lower extremity edema; but feels that his edema to his feet has increased within this past week.  He continues to take Lasix 20 mg on a daily basis.  He also takes 4 different blood pressure medications; some of which are diabetics as well.  Patient does have slight increased +2-3 edema to his bilateral lower extremities.  [name] has advised the patient increase his Lasix to 20 mg twice daily for a total of one week; and then to decrease back to his regular regimen of Lasix 20 mg on a daily basis.  Also, patient has plans to follow-up with his pulmonologist later this week.

## 2015-07-30 ENCOUNTER — Other Ambulatory Visit: Payer: Medicare Other

## 2015-07-31 ENCOUNTER — Ambulatory Visit (INDEPENDENT_AMBULATORY_CARE_PROVIDER_SITE_OTHER): Payer: Medicare Other | Admitting: Pulmonary Disease

## 2015-07-31 ENCOUNTER — Other Ambulatory Visit: Payer: Medicare Other

## 2015-07-31 ENCOUNTER — Encounter: Payer: Self-pay | Admitting: Pulmonary Disease

## 2015-07-31 VITALS — BP 134/60 | HR 93 | Temp 97.7°F | Wt 189.2 lb

## 2015-07-31 DIAGNOSIS — R040 Epistaxis: Secondary | ICD-10-CM | POA: Diagnosis not present

## 2015-07-31 DIAGNOSIS — J441 Chronic obstructive pulmonary disease with (acute) exacerbation: Secondary | ICD-10-CM

## 2015-07-31 DIAGNOSIS — C3490 Malignant neoplasm of unspecified part of unspecified bronchus or lung: Secondary | ICD-10-CM

## 2015-07-31 DIAGNOSIS — C7931 Secondary malignant neoplasm of brain: Secondary | ICD-10-CM

## 2015-07-31 MED ORDER — ALBUTEROL SULFATE (2.5 MG/3ML) 0.083% IN NEBU
2.5000 mg | INHALATION_SOLUTION | Freq: Once | RESPIRATORY_TRACT | Status: AC
  Administered 2015-07-31: 2.5 mg via RESPIRATORY_TRACT

## 2015-07-31 MED ORDER — METHYLPREDNISOLONE ACETATE 80 MG/ML IJ SUSP
80.0000 mg | Freq: Once | INTRAMUSCULAR | Status: AC
  Administered 2015-07-31: 80 mg via INTRAMUSCULAR

## 2015-07-31 MED ORDER — PREDNISONE 20 MG PO TABS: ORAL_TABLET | ORAL | Status: DC

## 2015-07-31 MED ORDER — LEVOFLOXACIN 750 MG PO TABS: 750.0000 mg | ORAL_TABLET | Freq: Every day | ORAL | Status: DC

## 2015-07-31 MED ORDER — FIRST-DUKES MOUTHWASH MT SUSP: OROMUCOSAL | Status: DC

## 2015-07-31 NOTE — Patient Instructions (Signed)
Today we updated your med list in our EPIC system...    Continue your current medications the same...  Today we collected a sputum specimen for culture...    We are going to start you on an antibiotic- LEVAQUIN '750mg'$  one tab daily x 7d     Be sure to take a probiotic like ALIGN daily while you are on the Levaquin to help your bowels...  We gave you a DEPO shot today... And we are prescribing PREDNISONE to reduce the bronchial inflammation>    Starting in the AM 1/20 take one '20mg'$  Prednisone tab twice daily for 5 days...    Then decrease to one tab each AM for 5 days...    Then decrease to 1/2 tab daily each AM til return office visit in 2 weeks...  It is very important that you continue the MUCINEX (Guaifenesin) '1200mg'$  twice daily w/ lots of water...    Do your NEB treatments 4 times daily & cough hard to expectorate the mucus...  For your leg swelling- continue the LASIX per DrMohammed...    NO SALT IN YOUR DIET...    Elevate your legs...    Wear support hose...  Call for any questions...  Let's plan a follow up visit in 2-3wks, sooner if needed for problems.Marland KitchenMarland Kitchen

## 2015-08-03 LAB — RESPIRATORY CULTURE OR RESPIRATORY AND SPUTUM CULTURE
GRAM STAIN: NONE SEEN
Organism ID, Bacteria: NORMAL

## 2015-08-04 ENCOUNTER — Telehealth: Payer: Self-pay | Admitting: Internal Medicine

## 2015-08-04 NOTE — Telephone Encounter (Signed)
Per patient 2/15 appointment time needed to be changed.

## 2015-08-06 ENCOUNTER — Other Ambulatory Visit: Payer: Medicare Other

## 2015-08-06 ENCOUNTER — Other Ambulatory Visit (HOSPITAL_BASED_OUTPATIENT_CLINIC_OR_DEPARTMENT_OTHER): Payer: Medicare Other

## 2015-08-06 DIAGNOSIS — C3491 Malignant neoplasm of unspecified part of right bronchus or lung: Secondary | ICD-10-CM

## 2015-08-06 DIAGNOSIS — C3411 Malignant neoplasm of upper lobe, right bronchus or lung: Secondary | ICD-10-CM

## 2015-08-06 LAB — CBC WITH DIFFERENTIAL/PLATELET
BASO%: 0.5 % (ref 0.0–2.0)
BASOS ABS: 0.1 10*3/uL (ref 0.0–0.1)
EOS%: 0.1 % (ref 0.0–7.0)
Eosinophils Absolute: 0 10*3/uL (ref 0.0–0.5)
HCT: 33.2 % — ABNORMAL LOW (ref 38.4–49.9)
HGB: 10.6 g/dL — ABNORMAL LOW (ref 13.0–17.1)
LYMPH%: 12.3 % — ABNORMAL LOW (ref 14.0–49.0)
MCH: 31.4 pg (ref 27.2–33.4)
MCHC: 31.8 g/dL — AB (ref 32.0–36.0)
MCV: 98.6 fL — ABNORMAL HIGH (ref 79.3–98.0)
MONO#: 1.3 10*3/uL — ABNORMAL HIGH (ref 0.1–0.9)
MONO%: 6.9 % (ref 0.0–14.0)
NEUT#: 15.2 10*3/uL — ABNORMAL HIGH (ref 1.5–6.5)
NEUT%: 80.2 % — AB (ref 39.0–75.0)
Platelets: 188 10*3/uL (ref 140–400)
RBC: 3.37 10*6/uL — AB (ref 4.20–5.82)
RDW: 16.9 % — ABNORMAL HIGH (ref 11.0–14.6)
WBC: 19 10*3/uL — ABNORMAL HIGH (ref 4.0–10.3)
lymph#: 2.3 10*3/uL (ref 0.9–3.3)

## 2015-08-06 LAB — COMPREHENSIVE METABOLIC PANEL
ALT: <9 U/L (ref 0–55)
AST: 12 U/L (ref 5–34)
Albumin: 3.1 g/dL — ABNORMAL LOW (ref 3.5–5.0)
Alkaline Phosphatase: 103 U/L (ref 40–150)
Anion Gap: 7 mEq/L (ref 3–11)
BUN: 15.9 mg/dL (ref 7.0–26.0)
CHLORIDE: 101 meq/L (ref 98–109)
CO2: 30 meq/L — AB (ref 22–29)
Calcium: 8.7 mg/dL (ref 8.4–10.4)
Creatinine: 1.2 mg/dL (ref 0.7–1.3)
EGFR: 61 mL/min/{1.73_m2} — ABNORMAL LOW (ref >90)
GLUCOSE: 118 mg/dL (ref 70–140)
POTASSIUM: 4.2 meq/L (ref 3.5–5.1)
SODIUM: 138 meq/L (ref 136–145)
Total Bilirubin: <0.3 mg/dL (ref 0.20–1.20)
Total Protein: 6.2 g/dL — ABNORMAL LOW (ref 6.4–8.3)

## 2015-08-12 ENCOUNTER — Other Ambulatory Visit: Payer: Self-pay | Admitting: Interventional Cardiology

## 2015-08-12 MED ORDER — ISOSORBIDE MONONITRATE ER 30 MG PO TB24: 30.0000 mg | ORAL_TABLET | Freq: Every day | ORAL | Status: AC

## 2015-08-13 ENCOUNTER — Other Ambulatory Visit (HOSPITAL_BASED_OUTPATIENT_CLINIC_OR_DEPARTMENT_OTHER): Payer: Medicare Other

## 2015-08-13 ENCOUNTER — Ambulatory Visit (HOSPITAL_BASED_OUTPATIENT_CLINIC_OR_DEPARTMENT_OTHER): Payer: Medicare Other | Admitting: Internal Medicine

## 2015-08-13 ENCOUNTER — Encounter: Payer: Self-pay | Admitting: *Deleted

## 2015-08-13 ENCOUNTER — Encounter: Payer: Self-pay | Admitting: Internal Medicine

## 2015-08-13 ENCOUNTER — Ambulatory Visit (HOSPITAL_BASED_OUTPATIENT_CLINIC_OR_DEPARTMENT_OTHER): Payer: Medicare Other

## 2015-08-13 VITALS — BP 163/71 | HR 91 | Temp 98.3°F | Resp 19 | Wt 182.0 lb

## 2015-08-13 VITALS — BP 145/75 | HR 83 | Resp 18

## 2015-08-13 DIAGNOSIS — C3411 Malignant neoplasm of upper lobe, right bronchus or lung: Secondary | ICD-10-CM

## 2015-08-13 DIAGNOSIS — C3491 Malignant neoplasm of unspecified part of right bronchus or lung: Secondary | ICD-10-CM

## 2015-08-13 DIAGNOSIS — C7931 Secondary malignant neoplasm of brain: Secondary | ICD-10-CM | POA: Diagnosis not present

## 2015-08-13 DIAGNOSIS — Z5112 Encounter for antineoplastic immunotherapy: Secondary | ICD-10-CM | POA: Diagnosis present

## 2015-08-13 DIAGNOSIS — Z5111 Encounter for antineoplastic chemotherapy: Secondary | ICD-10-CM

## 2015-08-13 LAB — COMPREHENSIVE METABOLIC PANEL
ALT: 13 U/L (ref 0–55)
ANION GAP: 8 meq/L (ref 3–11)
AST: 16 U/L (ref 5–34)
Albumin: 3.6 g/dL (ref 3.5–5.0)
Alkaline Phosphatase: 76 U/L (ref 40–150)
BUN: 18.1 mg/dL (ref 7.0–26.0)
CALCIUM: 8.9 mg/dL (ref 8.4–10.4)
CHLORIDE: 100 meq/L (ref 98–109)
CO2: 30 mEq/L — ABNORMAL HIGH (ref 22–29)
Creatinine: 1 mg/dL (ref 0.7–1.3)
EGFR: 78 mL/min/{1.73_m2} — ABNORMAL LOW (ref >90)
Glucose: 93 mg/dl (ref 70–140)
Potassium: 5 mEq/L (ref 3.5–5.1)
Sodium: 138 mEq/L (ref 136–145)
Total Bilirubin: 0.34 mg/dL (ref 0.20–1.20)
Total Protein: 6.9 g/dL (ref 6.4–8.3)

## 2015-08-13 LAB — CBC WITH DIFFERENTIAL/PLATELET
BASO%: 1.2 % (ref 0.0–2.0)
BASOS ABS: 0.1 10*3/uL (ref 0.0–0.1)
EOS%: 0.1 % (ref 0.0–7.0)
Eosinophils Absolute: 0 10*3/uL (ref 0.0–0.5)
HEMATOCRIT: 38.1 % — AB (ref 38.4–49.9)
HGB: 12.1 g/dL — ABNORMAL LOW (ref 13.0–17.1)
LYMPH#: 1.8 10*3/uL (ref 0.9–3.3)
LYMPH%: 18.5 % (ref 14.0–49.0)
MCH: 31.2 pg (ref 27.2–33.4)
MCHC: 31.8 g/dL — AB (ref 32.0–36.0)
MCV: 97.9 fL (ref 79.3–98.0)
MONO#: 1.6 10*3/uL — AB (ref 0.1–0.9)
MONO%: 16.1 % — ABNORMAL HIGH (ref 0.0–14.0)
NEUT#: 6.4 10*3/uL (ref 1.5–6.5)
NEUT%: 64.1 % (ref 39.0–75.0)
PLATELETS: 304 10*3/uL (ref 140–400)
RBC: 3.89 10*6/uL — ABNORMAL LOW (ref 4.20–5.82)
RDW: 17 % — ABNORMAL HIGH (ref 11.0–14.6)
WBC: 9.9 10*3/uL (ref 4.0–10.3)

## 2015-08-13 MED ORDER — ACETAMINOPHEN 325 MG PO TABS
650.0000 mg | ORAL_TABLET | Freq: Once | ORAL | Status: AC
  Administered 2015-08-13: 650 mg via ORAL

## 2015-08-13 MED ORDER — SODIUM CHLORIDE 0.9 % IV SOLN
Freq: Once | INTRAVENOUS | Status: AC
  Administered 2015-08-13: 11:00:00 via INTRAVENOUS
  Filled 2015-08-13: qty 4

## 2015-08-13 MED ORDER — DOCETAXEL CHEMO INJECTION 160 MG/16ML
75.0000 mg/m2 | Freq: Once | INTRAVENOUS | Status: AC
  Administered 2015-08-13: 150 mg via INTRAVENOUS
  Filled 2015-08-13: qty 15

## 2015-08-13 MED ORDER — ACETAMINOPHEN 325 MG PO TABS
ORAL_TABLET | ORAL | Status: AC
  Filled 2015-08-13: qty 2

## 2015-08-13 MED ORDER — HEPARIN SOD (PORK) LOCK FLUSH 100 UNIT/ML IV SOLN
500.0000 [IU] | Freq: Once | INTRAVENOUS | Status: AC | PRN
  Administered 2015-08-13: 500 [IU]
  Filled 2015-08-13: qty 5

## 2015-08-13 MED ORDER — DIPHENHYDRAMINE HCL 50 MG/ML IJ SOLN
INTRAMUSCULAR | Status: AC
  Filled 2015-08-13: qty 1

## 2015-08-13 MED ORDER — SODIUM CHLORIDE 0.9 % IV SOLN
Freq: Once | INTRAVENOUS | Status: AC
  Administered 2015-08-13: 11:00:00 via INTRAVENOUS

## 2015-08-13 MED ORDER — DIPHENHYDRAMINE HCL 50 MG/ML IJ SOLN
50.0000 mg | Freq: Once | INTRAMUSCULAR | Status: AC
  Administered 2015-08-13: 50 mg via INTRAVENOUS

## 2015-08-13 MED ORDER — SODIUM CHLORIDE 0.9 % IV SOLN
10.0000 mg/kg | Freq: Once | INTRAVENOUS | Status: AC
  Administered 2015-08-13: 800 mg via INTRAVENOUS
  Filled 2015-08-13: qty 70

## 2015-08-13 MED ORDER — DEXAMETHASONE 4 MG PO TABS: ORAL_TABLET | ORAL | Status: DC

## 2015-08-13 MED ORDER — SODIUM CHLORIDE 0.9 % IJ SOLN
10.0000 mL | INTRAMUSCULAR | Status: DC | PRN
  Administered 2015-08-13: 10 mL
  Filled 2015-08-13: qty 10

## 2015-08-13 NOTE — Progress Notes (Signed)
Oncology Nurse Navigator Documentation  Oncology Nurse Navigator Flowsheets 08/13/2015  Navigator Location [facility]  Navigator Encounter Type Clinic/MDC/spoke with patient and family member at cancer center.  He is not taking his Decadron.  [name] explained and I follow up with patient to educate again.   Patient Visit Type MedOnc  Treatment Phase Treatment  Barriers/Navigation Needs Education  Education Other  Interventions Education Method  Education Method Verbal  Acuity Level 1  Time Spent with Patient 15

## 2015-08-13 NOTE — Patient Instructions (Signed)
[facility] Discharge Instructions for Patients Receiving Chemotherapy  Today you received the following chemotherapy agents: Taxotere and Cyramza   To help prevent nausea and vomiting after your treatment, we encourage you to take your nausea medication as directed.    If you develop nausea and vomiting that is not controlled by your nausea medication, call the clinic.   BELOW ARE SYMPTOMS THAT SHOULD BE REPORTED IMMEDIATELY:  *FEVER GREATER THAN 100.5 F  *CHILLS WITH OR WITHOUT FEVER  NAUSEA AND VOMITING THAT IS NOT CONTROLLED WITH YOUR NAUSEA MEDICATION  *UNUSUAL SHORTNESS OF BREATH  *UNUSUAL BRUISING OR BLEEDING  TENDERNESS IN MOUTH AND THROAT WITH OR WITHOUT PRESENCE OF ULCERS  *URINARY PROBLEMS  *BOWEL PROBLEMS  UNUSUAL RASH Items with * indicate a potential emergency and should be followed up as soon as possible.  Feel free to call the clinic you have any questions or concerns. The clinic phone number is [phone].  Please show the CHEMO ALERT CARD at check-in to the Emergency Department and triage nurse.   

## 2015-08-13 NOTE — Progress Notes (Signed)
[facility] [phone]   [phone]  OFFICE PROGRESS NOTE  [name], PA-C [place] [address]  DIAGNOSIS: Metastatic non-small cell lung cancer, squamous cell carcinoma initially diagnosed as Stage IIA (T2b., N0, M0) non-small cell lung cancer, squamous cell carcinoma diagnosed in December of 2013   PRIOR THERAPY:  1) Status post right upper lobectomy under the care of Dr. Prescott Gum on 09/14/2012.  2) Adjuvant chemotherapy with cisplatin 75 mg/M2 and docetaxel 75 mg/M2 with Neulasta support every 3 weeks, status post 1 cycle. Starting with cycle #2, patient will receive carboplatin for an AUC initially of 4.5 and paclitaxel at 175 mg per meter squared with Neulasta support given every 3 weeks. Status post a total of 3 cycles.  3) Systemic chemotherapy with carboplatin for AUC of 5 on day 1 and gemcitabine 1000 MG/M2 on days 1 and 8 every 3 weeks. First dose 09/11/2014. Status post 4 cycles. Discontinued secondary to intolerance 4) Immunotherapy with Nivolumab 3 MG/KG every 2 weeks. Status post 4 cycles. 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  CURRENT THERAPY: Systemic chemotherapy with docetaxel 75 MG/M2 and Cyramza 10 MG/KG every 3 weeks with Neulasta support. Status post 5 cycles  CHEMOTHERAPY INTENT: Palliative. CURRENT # OF CHEMOTHERAPY CYCLES: 6 CURRENT ANTIEMETICS: Zofran, dexamethasone and Compazine  CURRENT SMOKING STATUS: Quit smoking recently.  ORAL CHEMOTHERAPY AND CONSENT: None  CURRENT BISPHOSPHONATES USE: None  PAIN MANAGEMENT: 3/10 controlled by Norco when necessary  NARCOTICS INDUCED CONSTIPATION: Milk of magnesia on as-needed basis.  LIVING WILL AND CODE STATUS: No CODE BLUE.   INTERVAL HISTORY:  BARTLETT ENKE 75 y.o. male returns to the clinic today for followup visit accompanied his cousin. The patient is currently undergoing systemic chemotherapy with  docetaxel and Cyramza status post 5 cycles and has been tolerating his treatment fairly well with no significant complaints except for fatigue for several days after the Neulasta injection. He also has some swelling of the lower extremities and abdominal distention. His last CT scan of the chest showed no evidence for disease progression. He also continues to have shortness of breath and currently on home oxygen. He denied having any significant chest pain but has mild cough and no hemoptysis. He denied having any significant nausea or vomiting, no fever or chills, no weight loss or night sweats.    MEDICAL HISTORY: Past Medical History  Diagnosis Date  . Arthritis   . Wheezing   . Sore throat   . Carotid artery occlusion   . COPD (chronic obstructive pulmonary disease) ([place])   . Lumbar disc disease   . Restless leg syndrome   . Allergic rhinitis   . Hypertension   . Anxiety     anxiety  . Shortness of breath   . Lung mass   . GERD (gastroesophageal reflux disease)   . H/O hiatal hernia   . Pre-operative cardiovascular examination   . Nonspecific abnormal unspecified cardiovascular function study   . Peripheral vascular disease, unspecified ([place])   . Pneumonia 2014    ?   Marland Kitchen On home oxygen therapy     prn  . Constipation   . Cancer ([place]) 09/14/12    invasive mod diff squamous cell ca  . 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 S/P radiation therapy 12/04/14 srs    lt frontal brain    ALLERGIES:  has No Known Allergies.  MEDICATIONS:  Current Outpatient Prescriptions  Medication Sig Dispense Refill  . amLODipine (NORVASC) 10 MG tablet Takes half a tablet    . aspirin EC 81 MG tablet Take 81 mg by mouth daily.     . clonazePAM (KLONOPIN) 0.5 MG tablet Take 0.5 mg by mouth 2 (two) times daily as needed for anxiety. Takes one tablet by mouth two times daily as needed for anxiety.    . Diphenhyd-Hydrocort-Nystatin (FIRST-DUKES MOUTHWASH) SUSP Swish and swallow 3 times a  day as needed 120 mL 0  . furosemide (LASIX) 20 MG tablet Change to Lasix 20 mg BID x 7 days; then return to 20 mg Q AM. 45 tablet 0  . guaiFENesin (MUCINEX) 600 MG 12 hr tablet Take 1,200 mg by mouth 2 (two) times daily. Reported on 06/30/2015    . HYDROcodone-acetaminophen (NORCO) 7.5-325 MG tablet Take 1 tablet by mouth every 6 (six) hours as needed for moderate pain. 45 tablet 0  . ipratropium-albuterol (DUONEB) 0.5-2.5 (3) MG/3ML SOLN Take 3 mLs by nebulization 4 (four) times daily. (Patient taking differently: Take 3 mLs by nebulization 4 (four) times daily. Pt takes nebulizer 2 to 3 times daily.) 360 mL 2  . isosorbide mononitrate (IMDUR) 30 MG 24 hr tablet Take 1 tablet (30 mg total) by mouth daily. 30 tablet 4  . levofloxacin (LEVAQUIN) 750 MG tablet Take 1 tablet (750 mg total) by mouth daily. 7 tablet 0  . lisinopril (PRINIVIL,ZESTRIL) 40 MG tablet Take 40 mg by mouth daily.    . metoprolol tartrate (LOPRESSOR) 25 MG tablet Take 25 mg by mouth 2 (two) times daily.     Marland Kitchen omeprazole (PRILOSEC) 40 MG capsule Take 40 mg by mouth daily.    . pramipexole (MIRAPEX) 1 MG tablet Take 1 mg by mouth at bedtime.    . predniSONE (DELTASONE) 20 MG tablet Take as directed 30 tablet 0  . prochlorperazine (COMPAZINE) 10 MG tablet Take 10 mg by mouth every 6 (six) hours as needed for nausea or vomiting. Reported on 06/30/2015    . traMADol (ULTRAM) 50 MG tablet Take 1 tablet (50 mg total) by mouth every 12 (twelve) hours as needed. 30 tablet 0  . zolpidem (AMBIEN) 10 MG tablet Take 10 mg by mouth at bedtime as needed for sleep. Reported on 06/30/2015     No current facility-administered medications for this visit.   Facility-Administered Medications Ordered in Other Visits  Medication Dose Route Frequency Provider Last Rate Last Dose  . sodium chloride 0.9 % injection 10 mL  10 mL Intracatheter PRN Curt Bears, MD   10 mL at 01/29/15 0944    SURGICAL HISTORY:  Past Surgical History  Procedure  Laterality Date  . Carotid endarterectomy  10/15/2010    left  . Hand surgery      repair of the left long, ring, and small finger after a saw injury  . Video bronchoscopy  07/24/2012    Procedure: VIDEO BRONCHOSCOPY WITH FLUORO;  Surgeon: Kathee Delton, MD;  Location: [facility];  Service: Cardiopulmonary;  Laterality: Bilateral;  . Lobectomy Right 09/14/2012    Procedure: LOBECTOMY;  Surgeon: Ivin Poot, MD;  Location: [facility];  Service: Thoracic;  Laterality: Right;  RIGHT UPPER LOBECTOMY  . Video assisted thoracoscopy Right 09/14/2012    Procedure: VIDEO ASSISTED THORACOSCOPY;  Surgeon: Ivin Poot, MD;  Location: [facility];  Service: Thoracic;  Laterality: Right;  . Video bronchoscopy with endobronchial ultrasound N/A 08/27/2014    Procedure: VIDEO  BRONCHOSCOPY WITH ENDOBRONCHIAL ULTRASOUND;  Surgeon: Ivin Poot, MD;  Location: [facility];  Service: Thoracic;  Laterality: N/A;  . Scalene node biopsy Right 08/27/2014    Procedure: BIOPSY SCALENE NODE;  Surgeon: Ivin Poot, MD;  Location: [facility];  Service: Thoracic;  Laterality: Right;  . Sterotactic radiosurgery Right 09/25/14    right parietal 30Gy/1 fx    REVIEW OF SYSTEMS:  Constitutional: positive for fatigue Eyes: positive for visual disturbance Ears, nose, mouth, throat, and face: negative Respiratory: positive for cough and dyspnea on exertion Cardiovascular: negative Gastrointestinal: negative Genitourinary:negative Integument/breast: negative Hematologic/lymphatic: negative Musculoskeletal:negative Neurological: negative Behavioral/Psych: negative Endocrine: negative Allergic/Immunologic: negative   PHYSICAL EXAMINATION: General appearance: alert, cooperative and no distress Head: Normocephalic, without obvious abnormality, atraumatic Neck: no adenopathy, no JVD, supple, symmetrical, trachea midline and thyroid not enlarged, symmetric, no tenderness/mass/nodules Lymph nodes: Cervical, supraclavicular, and axillary  nodes normal. Resp: wheezes bilaterally Back: symmetric, no curvature. ROM normal. No CVA tenderness. Cardio: regular rate and rhythm, S1, S2 normal, no murmur, click, rub or gallop GI: soft, non-tender; bowel sounds normal; no masses,  no organomegaly Extremities: extremities normal, atraumatic, no cyanosis or edema Neurologic: Alert and oriented X 3, normal strength and tone. Normal symmetric reflexes. Normal coordination and gait  ECOG PERFORMANCE STATUS: 1 - Symptomatic but completely ambulatory  Blood pressure 163/71, pulse 91, temperature 98.3 F (36.8 C), temperature source Oral, resp. rate 19, weight 182 lb (82.555 kg), SpO2 100 %.  LABORATORY DATA: Lab Results  Component Value Date   WBC 9.9 08/13/2015   HGB 12.1* 08/13/2015   HCT 38.1* 08/13/2015   MCV 97.9 08/13/2015   PLT 304 08/13/2015      Chemistry      Component Value Date/Time   NA 138 08/13/2015 0925   NA 138 12/05/2014 0618   K 5.0 08/13/2015 0925   K 3.4* 12/05/2014 0618   CL 95* 12/05/2014 0618   CL 101 12/28/2012 0852   CO2 30* 08/13/2015 0925   CO2 32 12/05/2014 0618   BUN 18.1 08/13/2015 0925   BUN 9 12/05/2014 0618   CREATININE 1.0 08/13/2015 0925   CREATININE 0.52* 12/05/2014 0618      Component Value Date/Time   CALCIUM 8.9 08/13/2015 0925   CALCIUM 9.2 12/05/2014 0618   ALKPHOS 76 08/13/2015 0925   ALKPHOS 71 12/05/2014 0618   AST 16 08/13/2015 0925   AST 24 12/05/2014 0618   ALT 13 08/13/2015 0925   ALT 30 12/05/2014 0618   BILITOT 0.34 08/13/2015 0925   BILITOT 0.2* 12/05/2014 0618       RADIOGRAPHIC STUDIES: Ct Angio Chest Pe W/cm &/or Wo Cm  07/29/2015  CLINICAL DATA:  Shortness of breath for 3 days. History of right lung cancer with surgery in 2013. Ongoing chemotherapy. EXAM: CT ANGIOGRAPHY CHEST WITH CONTRAST TECHNIQUE: Multidetector CT imaging of the chest was performed using the standard protocol during bolus administration of intravenous contrast. Multiplanar CT image  reconstructions and MIPs were obtained to evaluate the vascular anatomy. CONTRAST:  148m OMNIPAQUE IOHEXOL 350 MG/ML SOLN COMPARISON:  Chest x-ray June 20, 2015 and CT of the chest June 04, 2015 FINDINGS: There is debris layering posteriorly in the trachea, extending into the right mainstem bronchus, likely mucus. The left-sided airways are normal. Postsurgical changes are seen in the right-sided airways. Bronchial wall thickening is seen centrally on the right, new in the interval. This could be due to postradiation change or bronchitis. No pneumothorax. Platelike opacity in the right lung  on series 8, image 40, consistent with atelectasis. Mild dependent atelectasis is seen. There is a small peripheral nodule in the left on series 8, image 22, similar in the interval given difference in slice selection. Recommend attention on follow-up. No new pulmonary nodules. No pulmonary masses or suspicious infiltrates. The right-sided pleural effusion is larger in the interval but remains small. There is now a tiny left effusion which was not seen previously. A right Port-A-Cath is in good position. No adenopathy in the neck or bilateral axilla. No mediastinal or hilar adenopathy. Coronary artery calcifications again noted. No pericardial effusion or significant cardiomegaly. Atherosclerosis is seen in the non aneurysmal thoracic aorta. No dissection. No pulmonary emboli are identified. No acute abnormalities seen on limited views the upper abdomen. There is an old rib fracture on the right. No bony metastatic disease identified. Review of the MIP images confirms the above findings. IMPRESSION: 1. No pulmonary emboli. 2. Postsurgical and therapeutic changes in the right lung. Thickening of the proximal bronchi on the right may be post therapeutic or due to bronchitis. Recommend clinical correlation. 3. No evidence of recurrent malignancy. 4. Significant mucus in the trachea. 5. Bilateral pleural effusions are small  but larger in the interval. Electronically Signed   By: Dorise Bullion III M.D   On: 07/29/2015 11:23    ASSESSMENT AND PLAN: This is a very pleasant 74 years old white male with history of stage IIA non-small cell lung cancer status post right upper lobectomy followed by 4 cycles of adjuvant chemotherapy.  The patient underwent systemic chemotherapy with carboplatin and gemcitabine status post 4 cycles discontinued secondary to intolerance and mild disease progression.  The patient completed treatment with Nivolumab status post 4 cycles but this was discontinued secondary to disease progression. He also recently completed stereotactic radiotherapy to metastatic brain lesions. The patient is currently undergoing systemic chemotherapy with docetaxel and Cyramza status post 5 cycles and has been tolerating his treatment well except for the aching pain after Neulasta injection. His recent CT scan of the chest showed no evidence for disease progression. I discussed the scan results with the patient and his cousin. I recommended for him to continue his current treatment with docetaxel and Cyramza with cycle #6 today as a scheduled. He would come back for follow-up visit in 3 weeks for reevaluation with the start of cycle #7. He was advised to call immediately if he has any concerning symptoms in the interval.  The patient voices understanding of current disease status and treatment options and is in agreement with the current care plan.  All questions were answered. The patient knows to call the clinic with any problems, questions or concerns. We can certainly see the patient much sooner if necessary.  Disclaimer: This note was dictated with voice recognition software. Similar sounding words can inadvertently be transcribed and may not be corrected upon review.

## 2015-08-14 ENCOUNTER — Ambulatory Visit (HOSPITAL_BASED_OUTPATIENT_CLINIC_OR_DEPARTMENT_OTHER): Payer: Medicare Other

## 2015-08-14 VITALS — BP 134/58 | HR 79 | Temp 98.1°F | Resp 22

## 2015-08-14 DIAGNOSIS — C3411 Malignant neoplasm of upper lobe, right bronchus or lung: Secondary | ICD-10-CM

## 2015-08-14 DIAGNOSIS — Z5189 Encounter for other specified aftercare: Secondary | ICD-10-CM | POA: Diagnosis not present

## 2015-08-14 DIAGNOSIS — C3491 Malignant neoplasm of unspecified part of right bronchus or lung: Secondary | ICD-10-CM

## 2015-08-14 MED ORDER — PEGFILGRASTIM INJECTION 6 MG/0.6ML ~~LOC~~
6.0000 mg | PREFILLED_SYRINGE | Freq: Once | SUBCUTANEOUS | Status: AC
  Administered 2015-08-14: 6 mg via SUBCUTANEOUS
  Filled 2015-08-14: qty 0.6

## 2015-08-18 ENCOUNTER — Ambulatory Visit
Admission: RE | Admit: 2015-08-18 | Discharge: 2015-08-18 | Disposition: A | Discharge status: Home / Self Care | Payer: Medicare Other | Source: Ambulatory Visit | Attending: Radiation Oncology | Admitting: Radiation Oncology

## 2015-08-18 ENCOUNTER — Ambulatory Visit: Payer: Self-pay | Admitting: Radiation Oncology

## 2015-08-18 ENCOUNTER — Encounter: Payer: Self-pay | Admitting: Radiation Oncology

## 2015-08-18 ENCOUNTER — Ambulatory Visit (HOSPITAL_BASED_OUTPATIENT_CLINIC_OR_DEPARTMENT_OTHER): Payer: Medicare Other

## 2015-08-18 VITALS — BP 162/70 | HR 101 | Temp 97.9°F | Resp 22 | Ht 69.0 in | Wt 182.5 lb

## 2015-08-18 DIAGNOSIS — C3491 Malignant neoplasm of unspecified part of right bronchus or lung: Secondary | ICD-10-CM

## 2015-08-18 DIAGNOSIS — C3411 Malignant neoplasm of upper lobe, right bronchus or lung: Secondary | ICD-10-CM

## 2015-08-18 DIAGNOSIS — C770 Secondary and unspecified malignant neoplasm of lymph nodes of head, face and neck: Secondary | ICD-10-CM

## 2015-08-18 LAB — CBC WITH DIFFERENTIAL/PLATELET
BASO%: 0.6 % (ref 0.0–2.0)
Basophils Absolute: 0.1 10*3/uL (ref 0.0–0.1)
EOS ABS: 0 10*3/uL (ref 0.0–0.5)
EOS%: 0 % (ref 0.0–7.0)
HEMATOCRIT: 32.9 % — AB (ref 38.4–49.9)
HGB: 10.5 g/dL — ABNORMAL LOW (ref 13.0–17.1)
LYMPH%: 11.8 % — AB (ref 14.0–49.0)
MCH: 32.1 pg (ref 27.2–33.4)
MCHC: 31.9 g/dL — ABNORMAL LOW (ref 32.0–36.0)
MCV: 100.6 fL — ABNORMAL HIGH (ref 79.3–98.0)
MONO#: 0 10*3/uL — AB (ref 0.1–0.9)
MONO%: 0.2 % (ref 0.0–14.0)
NEUT%: 87.4 % — ABNORMAL HIGH (ref 39.0–75.0)
NEUTROS ABS: 14.3 10*3/uL — AB (ref 1.5–6.5)
NRBC: 0 % (ref 0–0)
PLATELETS: 132 10*3/uL — AB (ref 140–400)
RBC: 3.27 10*6/uL — ABNORMAL LOW (ref 4.20–5.82)
RDW: 17.2 % — ABNORMAL HIGH (ref 11.0–14.6)
WBC: 16.3 10*3/uL — AB (ref 4.0–10.3)
lymph#: 1.9 10*3/uL (ref 0.9–3.3)

## 2015-08-18 LAB — COMPREHENSIVE METABOLIC PANEL
ALBUMIN: 3.5 g/dL (ref 3.5–5.0)
ALT: 17 U/L (ref 0–55)
ANION GAP: 8 meq/L (ref 3–11)
AST: 19 U/L (ref 5–34)
Alkaline Phosphatase: 100 U/L (ref 40–150)
BUN: 35 mg/dL — ABNORMAL HIGH (ref 7.0–26.0)
CALCIUM: 8.3 mg/dL — AB (ref 8.4–10.4)
CO2: 28 mEq/L (ref 22–29)
CREATININE: 1 mg/dL (ref 0.7–1.3)
Chloride: 106 mEq/L (ref 98–109)
EGFR: 75 mL/min/{1.73_m2} — ABNORMAL LOW (ref >90)
Glucose: 92 mg/dl (ref 70–140)
Potassium: 5.2 mEq/L — ABNORMAL HIGH (ref 3.5–5.1)
Sodium: 142 mEq/L (ref 136–145)
TOTAL PROTEIN: 6.5 g/dL (ref 6.4–8.3)

## 2015-08-18 NOTE — Patient Instructions (Signed)
Contact our office if you have any questions following today's appointment: [phone].  

## 2015-08-18 NOTE — Progress Notes (Addendum)
Follow up s/p SRS Brain  07/18/15, posterior headace  cahe once in a while, none at present, no nausea,  Feels eyes getting weaker, blurred steadily gowing down stated, soreness in his fingers,  On prednisone 20 mg daily, eating well, feeet swelling, sob on Oxygen 2 liters  N/c, last Chemotherapy infusion last Wednesday 9:25 AM BP 162/70 mmHg  Pulse 101  Temp(Src) 97.9 F (36.6 C) (Oral)  Resp 22  Ht '5\' 9"'$  (1.753 m)  Wt 182 lb 8 oz (82.781 kg)  BMI 26.94 kg/m2  SpO2 97%  Wt Readings from Last 3 Encounters:  08/18/15 182 lb 8 oz (82.781 kg)  08/13/15 182 lb (82.555 kg)  07/31/15 189 lb 3.2 oz (85.821 kg)

## 2015-08-18 NOTE — Progress Notes (Signed)
Radiation Oncology         [phone] ________________________________  Name: [name] MRN: [id number]  Date: 08/18/2015  DOB: May 31, 1941  Follow-Up Visit Note  CC: [name], MD  Diagnosis: Metastatic Lung Cancer    ICD-9-CM ICD-10-CM   1. Metastatic lung cancer (metastasis from lung to other site), right (HCC) 162.9 C34.91   2. 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  07/18/15: PTV6 Inf Left Frontal 83m target was treated using 4 Arcs to a prescription dose of 20 Gy. ExacTrac Snap verification was performed for each couch angle.  05/28/2015 through 06/20/2015:  The patient was treated to the right neck in a palliative manner to a dose of 35 gray in 14 fractions at 2.5 gray per fraction. This was treated using a 2 field, 3-D conformal technique. The patient required a re-simulation during his treatment.  03/12/2015: 1.  PTV3 Rt Cerebellum 154mtarget was treated using 3 Arcs to a prescription dose of 20 Gy. ExacTrac Snap verification was performed for each couch angle.  2.  PTV4 Rt Frontal 64m4marget was treated using 3 Arcs to a prescription dose of 20 Gy. ExacTrac Snap verification was performed for each couch angle. 3.  PTV5 Lt Parietal 2mm17mrget was treated using 2 Arcs to a prescription dose of 20 Gy. ExacTrac Snap verification was performed for each couch angle.  12/04/14: PTV2 Lt frontal 3mm 56mget was treated using 2 Arcs to a prescription dose of 20 Gy. ExacTrac Snap verification was performed for each couch angle.   09/25/2014: The solitary 2 mm right parietal lesion was treated using a course of radiosurgery to a dose of 20 gray in 1 fractions. This consisted of 3 arcs.  Narrative:  The patient returns today for routine follow-up. He saw Dr. MohamJulien Nordmann/1/17 and is undergoing systemic chemotherapy with Docetaxel and Cyramza. CT of the chest last month showed no evidence of disease  progression. He continues with 1 L of oxygen daily and her care of pulmonary. He also continues 20 mg of prednisone daily.   On review of systems the patient reports that overall he is doing better. He feels as though his blurred vision is increasing but states that this is more likely consistent with his age. He states an occasional 8 at the base of his occipital region but feels that this is positional from sleeping poorly. He continues to lower extremity edema , and has been ruled out for blood clot. He denies any chest pain or shortness of breath currently. He denies any unintended weight changes and reports he is eating well. He is not having any nausea, vomiting, bowel or bladder disturbances. A complete review of systems is obtained and is otherwise negative.    ALLERGIES:  has No Known Allergies.  Meds: Current Outpatient Prescriptions  Medication Sig Dispense Refill  . aspirin EC 81 MG tablet Take 81 mg by mouth daily.     . clonazePAM (KLONOPIN) 0.5 MG tablet Take 0.5 mg by mouth 2 (two) times daily as needed for anxiety. Takes one tablet by mouth two times daily as needed for anxiety.    . dexMarland Kitchenmethasone (DECADRON) 4 MG tablet 2 tablets by mouth twice a day the day before, day of and day after the chemotherapy every 3 weeks. 30 tablet 1  . furosemide (LASIX) 20 MG tablet Change to Lasix 20 mg BID x 7 days;  then return to 20 mg Q AM. 45 tablet 0  . guaiFENesin (MUCINEX) 600 MG 12 hr tablet Take 1,200 mg by mouth 2 (two) times daily. Reported on 06/30/2015    . HYDROcodone-acetaminophen (NORCO) 7.5-325 MG tablet Take 1 tablet by mouth every 6 (six) hours as needed for moderate pain. 45 tablet 0  . ipratropium-albuterol (DUONEB) 0.5-2.5 (3) MG/3ML SOLN Take 3 mLs by nebulization 4 (four) times daily. (Patient taking differently: Take 3 mLs by nebulization 4 (four) times daily. Pt takes nebulizer 2 to 3 times daily.) 360 mL 2  . isosorbide mononitrate (IMDUR) 30 MG 24 hr tablet Take 1 tablet  (30 mg total) by mouth daily. 30 tablet 4  . lisinopril (PRINIVIL,ZESTRIL) 40 MG tablet Take 40 mg by mouth daily.    . metoprolol tartrate (LOPRESSOR) 25 MG tablet Take 25 mg by mouth 2 (two) times daily.     Marland Kitchen omeprazole (PRILOSEC) 40 MG capsule Take 40 mg by mouth daily.    . pramipexole (MIRAPEX) 1 MG tablet Take 1 mg by mouth at bedtime.    . predniSONE (DELTASONE) 20 MG tablet Take as directed (Patient taking differently: Take 20 mg by mouth daily with breakfast. Take as directed) 30 tablet 0  . traMADol (ULTRAM) 50 MG tablet Take 1 tablet (50 mg total) by mouth every 12 (twelve) hours as needed. 30 tablet 0  . zolpidem (AMBIEN) 10 MG tablet Take 10 mg by mouth at bedtime as needed for sleep. Reported on 06/30/2015    . amLODipine (NORVASC) 10 MG tablet Take 5 mg by mouth daily. Takes half a tablet    . Diphenhyd-Hydrocort-Nystatin (FIRST-DUKES MOUTHWASH) SUSP Swish and swallow 3 times a day as needed (Patient not taking: Reported on 08/18/2015) 120 mL 0  . levofloxacin (LEVAQUIN) 750 MG tablet Take 1 tablet (750 mg total) by mouth daily. (Patient not taking: Reported on 08/18/2015) 7 tablet 0  . prochlorperazine (COMPAZINE) 10 MG tablet Take 10 mg by mouth every 6 (six) hours as needed for nausea or vomiting. Reported on 06/30/2015     No current facility-administered medications for this encounter.   Facility-Administered Medications Ordered in Other Encounters  Medication Dose Route Frequency Provider Last Rate Last Dose  . sodium chloride 0.9 % injection 10 mL  10 mL Intracatheter PRN Curt Bears, MD   10 mL at 01/29/15 0944    Physical Findings:  height is '5\' 9"'$  (1.753 m) and weight is 182 lb 8 oz (82.781 kg). His oral temperature is 97.9 F (36.6 C). His blood pressure is 162/70 and his pulse is 101. His respiration is 22 and oxygen saturation is 97%.    pain scale 0/10 In general this is a  Chronically ill-appearing Caucasian male in no acute distress. He's alert and oriented 4  and appropriate throughout the examination. Cardiac pulmonary assessment reveals no acute distress and he exhibits normal effort. HEENT reveals that he is normocephalic, atraumatic, EOMs are intact. Oral mucosa is intact without plaques or lesions. No visual field deficits are appreciated. Lower extremities are notable for 1+ pitting edema bilaterally from the dorsal aspect of his feet to the level of the tibial tuberosity. Along the right , there is A 1-1/2 cm purple colored lesion  Concerning for healing Ulcer. The abdomen is obese, soft nontender and moderately distended. No fluid wave is appreciated.  Lab Findings: Lab Results  Component Value Date   WBC 9.9 08/13/2015   WBC 14.5* 06/03/2015   HGB 12.1* 08/13/2015  HGB 11.3* 06/03/2015   HCT 38.1* 08/13/2015   HCT 36.0* 06/03/2015   PLT 304 08/13/2015   PLT 232 06/03/2015    Lab Results  Component Value Date   NA 138 08/13/2015   NA 138 12/05/2014   K 5.0 08/13/2015   K 3.4* 12/05/2014   CHLORIDE 100 08/13/2015   CO2 30* 08/13/2015   CO2 32 12/05/2014   GLUCOSE 93 08/13/2015   GLUCOSE 97 12/05/2014   GLUCOSE 117* 12/28/2012   BUN 18.1 08/13/2015   BUN 9 12/05/2014   CREATININE 1.0 08/13/2015   CREATININE 0.52* 12/05/2014   BILITOT 0.34 08/13/2015   BILITOT 0.2* 12/05/2014   ALKPHOS 76 08/13/2015   ALKPHOS 71 12/05/2014   AST 16 08/13/2015   AST 24 12/05/2014   ALT 13 08/13/2015   ALT 30 12/05/2014   PROT 6.9 08/13/2015   PROT 6.6 12/05/2014   ALBUMIN 3.6 08/13/2015   ALBUMIN 3.5 12/05/2014   CALCIUM 8.9 08/13/2015   CALCIUM 9.2 12/05/2014   ANIONGAP 8 08/13/2015   ANIONGAP 11 12/05/2014    Radiographic Findings: Ct Angio Chest Pe W/cm &/or Wo Cm  07/29/2015  CLINICAL DATA:  Shortness of breath for 3 days. History of right lung cancer with surgery in 2013. Ongoing chemotherapy. EXAM: CT ANGIOGRAPHY CHEST WITH CONTRAST TECHNIQUE: Multidetector CT imaging of the chest was performed using the standard protocol  during bolus administration of intravenous contrast. Multiplanar CT image reconstructions and MIPs were obtained to evaluate the vascular anatomy. CONTRAST:  189m OMNIPAQUE IOHEXOL 350 MG/ML SOLN COMPARISON:  Chest x-ray June 20, 2015 and CT of the chest June 04, 2015 FINDINGS: There is debris layering posteriorly in the trachea, extending into the right mainstem bronchus, likely mucus. The left-sided airways are normal. Postsurgical changes are seen in the right-sided airways. Bronchial wall thickening is seen centrally on the right, new in the interval. This could be due to postradiation change or bronchitis. No pneumothorax. Platelike opacity in the right lung on series 8, image 40, consistent with atelectasis. Mild dependent atelectasis is seen. There is a small peripheral nodule in the left on series 8, image 22, similar in the interval given difference in slice selection. Recommend attention on follow-up. No new pulmonary nodules. No pulmonary masses or suspicious infiltrates. The right-sided pleural effusion is larger in the interval but remains small. There is now a tiny left effusion which was not seen previously. A right Port-A-Cath is in good position. No adenopathy in the neck or bilateral axilla. No mediastinal or hilar adenopathy. Coronary artery calcifications again noted. No pericardial effusion or significant cardiomegaly. Atherosclerosis is seen in the non aneurysmal thoracic aorta. No dissection. No pulmonary emboli are identified. No acute abnormalities seen on limited views the upper abdomen. There is an old rib fracture on the right. No bony metastatic disease identified. Review of the MIP images confirms the above findings. IMPRESSION: 1. No pulmonary emboli. 2. Postsurgical and therapeutic changes in the right lung. Thickening of the proximal bronchi on the right may be post therapeutic or due to bronchitis. Recommend clinical correlation. 3. No evidence of recurrent malignancy. 4.  Significant mucus in the trachea. 5. Bilateral pleural effusions are small but larger in the interval. 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  Plan:   The patient appears to  be doing well from his radiation therapy. We will continue to follow him closely , and proceed with T3 MRI Anaprox late 2 months time. We discussed that imaging would be recommended every 3 months for at least the first year following treatment. He will continue under the care of  Pulmonary medicine and [name] for managing his pulmonary based symptoms and primary cancer.  He is advised that he should be followed by ophthalmology for further assessment, and we will continue to follow this expectantly  Carola Rhine, PAC  This document serves as a record of services personally performed by Shona Simpson, PAC. It was created on her behalf by Darcus Austin, a trained medical scribe. The creation of this record is based on the scribe's personal observations and the provider's statements to them. This document has been checked and approved by the attending provider.

## 2015-08-20 ENCOUNTER — Other Ambulatory Visit: Payer: Medicare Other

## 2015-08-20 ENCOUNTER — Telehealth: Payer: Self-pay

## 2015-08-20 NOTE — Telephone Encounter (Signed)
Pt has been having constipation since his last chemo February 2.  When it does move it is pebbles. Has been taking dulcolax, he has been taking prunes. There will be a little diarrhea and then the pellets. He is not making it to the bathroom. We discussed increasing his colace 1-2 per day, using mag citrate today, 1/2 bottle and then the other 1/2 bottle if not results in 4 hours. Pt has blisters on his lips and on the outside of his lip. His tongue is not sore. We discussed using vaseline. Hands are dry and sore, fingernails are tender and darkened. We discussed keeping hands clean and using lotion frequently. They have lubriderm lotion His feet are sore and are still swollen.  The swelling is not going down in his stomach.

## 2015-08-27 ENCOUNTER — Other Ambulatory Visit (HOSPITAL_BASED_OUTPATIENT_CLINIC_OR_DEPARTMENT_OTHER): Payer: Medicare Other

## 2015-08-27 ENCOUNTER — Encounter: Payer: Self-pay | Admitting: Pulmonary Disease

## 2015-08-27 ENCOUNTER — Ambulatory Visit (INDEPENDENT_AMBULATORY_CARE_PROVIDER_SITE_OTHER): Payer: Medicare Other | Admitting: Pulmonary Disease

## 2015-08-27 ENCOUNTER — Other Ambulatory Visit: Payer: Medicare Other

## 2015-08-27 VITALS — BP 152/70 | HR 103 | Temp 97.8°F | Ht 69.0 in | Wt 175.8 lb

## 2015-08-27 DIAGNOSIS — C3411 Malignant neoplasm of upper lobe, right bronchus or lung: Secondary | ICD-10-CM | POA: Diagnosis not present

## 2015-08-27 DIAGNOSIS — K5909 Other constipation: Secondary | ICD-10-CM

## 2015-08-27 DIAGNOSIS — C7931 Secondary malignant neoplasm of brain: Secondary | ICD-10-CM | POA: Diagnosis not present

## 2015-08-27 DIAGNOSIS — C3490 Malignant neoplasm of unspecified part of unspecified bronchus or lung: Secondary | ICD-10-CM | POA: Diagnosis not present

## 2015-08-27 DIAGNOSIS — C3491 Malignant neoplasm of unspecified part of right bronchus or lung: Secondary | ICD-10-CM

## 2015-08-27 DIAGNOSIS — J441 Chronic obstructive pulmonary disease with (acute) exacerbation: Secondary | ICD-10-CM

## 2015-08-27 LAB — COMPREHENSIVE METABOLIC PANEL
ALBUMIN: 3.3 g/dL — AB (ref 3.5–5.0)
ALK PHOS: 104 U/L (ref 40–150)
ALT: 15 U/L (ref 0–55)
ANION GAP: 9 meq/L (ref 3–11)
AST: 17 U/L (ref 5–34)
BUN: 18.3 mg/dL (ref 7.0–26.0)
CALCIUM: 8.6 mg/dL (ref 8.4–10.4)
CO2: 27 mEq/L (ref 22–29)
Chloride: 103 mEq/L (ref 98–109)
Creatinine: 1 mg/dL (ref 0.7–1.3)
EGFR: 74 mL/min/{1.73_m2} — AB (ref >90)
GLUCOSE: 110 mg/dL (ref 70–140)
Potassium: 3.9 mEq/L (ref 3.5–5.1)
SODIUM: 139 meq/L (ref 136–145)
TOTAL PROTEIN: 6.5 g/dL (ref 6.4–8.3)

## 2015-08-27 LAB — CBC WITH DIFFERENTIAL/PLATELET
BASO%: 0.4 % (ref 0.0–2.0)
BASOS ABS: 0.1 10*3/uL (ref 0.0–0.1)
EOS ABS: 0 10*3/uL (ref 0.0–0.5)
EOS%: 0 % (ref 0.0–7.0)
HCT: 34.3 % — ABNORMAL LOW (ref 38.4–49.9)
HEMOGLOBIN: 11 g/dL — AB (ref 13.0–17.1)
LYMPH#: 2.1 10*3/uL (ref 0.9–3.3)
LYMPH%: 14 % (ref 14.0–49.0)
MCH: 31.7 pg (ref 27.2–33.4)
MCHC: 32.1 g/dL (ref 32.0–36.0)
MCV: 98.6 fL — AB (ref 79.3–98.0)
MONO#: 1 10*3/uL — AB (ref 0.1–0.9)
MONO%: 6.5 % (ref 0.0–14.0)
NEUT%: 79.1 % — ABNORMAL HIGH (ref 39.0–75.0)
NEUTROS ABS: 11.8 10*3/uL — AB (ref 1.5–6.5)
PLATELETS: 146 10*3/uL (ref 140–400)
RBC: 3.48 10*6/uL — ABNORMAL LOW (ref 4.20–5.82)
RDW: 16.6 % — AB (ref 11.0–14.6)
WBC: 15 10*3/uL — AB (ref 4.0–10.3)

## 2015-08-27 MED ORDER — PREDNISONE 10 MG PO TABS
10.0000 mg | ORAL_TABLET | Freq: Every day | ORAL | Status: DC
Start: 2015-08-27 — End: 2015-09-24

## 2015-08-27 NOTE — Patient Instructions (Signed)
Today we updated your med list in our EPIC system...    Continue your current medications the same...  Please use the NEBULIZER meds 4 times daily - breakfast, lunch, dinner, bedtime...  We are decreasing the PREDNISONE to '10mg'$  daily & stay on this dose...  Ok to adjust the Mucinex as directed - 1-2 tabs twice daily as needed for thick phlegm...  Call for any questions...  Let's plan a follow up visit in 6-8 weeks, sooner if needed for problems.Marland KitchenMarland Kitchen

## 2015-08-28 ENCOUNTER — Telehealth: Payer: Self-pay | Admitting: Medical Oncology

## 2015-08-28 NOTE — Telephone Encounter (Signed)
Requests recert pt for oxygen . She is faxing a form.

## 2015-08-29 ENCOUNTER — Encounter: Payer: Self-pay | Admitting: Pulmonary Disease

## 2015-08-29 NOTE — Progress Notes (Signed)
Subjective:    Patient ID: [name], male    DOB: 1940-09-15, 75 y.o.   MRN: [id number]  HPI  Baseline PFT 11/2011 showed FVC=3.17 (72%), FEV1=1.90 (63%), %1sec=60, mid-flows reduced at 32% predicted; 3% improvement in FEV1 after bronchodil; air trapping on LVs and DLCO was wnl...   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   3/14> he had RULobectomy by Dr. 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  He saw DrMohamed in consult & was quoted a 5-10% 36yrsurvival w/ 4 cycles of adjuvant chemotherapy => completed 12/2012...  He remained disease free for 1.5 yrs, but continued smoking...   F/u CT Chest 07/2014 showed new pulm nodules and extensive adenopathy- mediastinal, hilar, axillary...  Subseq PET scan 1/16 showed pos for widespread lymphangitic dis in both lungs, bilat hilar/ mediastinal/ right axillary, right supraclavic nodes, and right adrenal gland...  MRI Br 1/16 showed small enhancing lesion in right parietal lobe...  DrMohamed referred to Dr. VPrescott Gumfor bronch w/ EUS bx of mediastinal nodes=> pos EBUS and scalene node bx w/ metastatic squamous cell cancer.  3/16> DrM indicated to pt that his disease is incurable & further treatment palliative => they proceeded w/ further chemotherapy, 4 cycles completed 5/16...  He saw DrMoody for XRT 3/16 due to HAs & they gave 20Gy radiation in 1 fraction but HAs continued...  5/16> repeat MRI Brain showed resolution of small right parietal lesion , but new 356mleft frontal  lesion & acute punctate small vessel infarct in the left centrum semiovale => one more dose of 20Gy radiation given 12/04/14...  F/u CT Chest/ Abd/ Pelvis 12/06/14 showed a mixed response to therapy w/ innumerable pulm nodules- some same or smaller, but nodes same or larger, liver ok but right adrenal met larger, & right kidney lesion is suspicious for met; vascular & coronary calcif, extensive spondylosis in lumbar spine...   DrMohamed offered the pt Opdivo immunotherapy & 1st dose given 12/18/14...   ~  December 18, 2014:  Add-on appt w/ SN for increased SOB & cough> pt c/o several month hx of increased SOB, cough, thick beige sput & intermittent wheezing, no hemoptysis; now on Opdivo for lung cancer palliative rx- I reviewed his hx in detail & went over his recent CT Chest/ Abd Pelvis results (their understanding was minimal & he does not have a grasp of the prognosis- eg wife was surprised about the atherosclerosis & wanted to know what could be done about this)...    COPD, quit smoking 09/2014> on O2 at 2-3L/min, NEBS w/ Albut prn; he is very congested w/ bilat rhonchi & wheezing; we discussed Rx w/ DUONEB Qid, MUCINEX Qid + fluids, and Depo80+PRED taper...    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    HBP & ASHD> followed by DrVaranasi, his notes reviewed, on ECASA81, Metop25/d, Amlod10; BP= 150/80 and he knows that he can take an extra Metop25 daily if  needed for BP...    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    GERD> on Protionix40    RLS> on Mirapex 86m Qhs...    Anxiety/ insomnia> on Klonopin0.5Bid, Ativan0.5 prn, Ambien10 prn...    Anemia> Labs by DrMohammed 12/05/14 showed Hg=7.6 & he was Tx 2u w/ Hg improved to 10.9 by 12/11/14...  We reviewed prob list, meds, xrays and labs>    CXR 12/05/14 showed norm heart size, diffuse interstitial coarsening & nodularity w/ scarring, s/p right lung surg, etc...  LABS 6/16: reviewed in Epic:  Chems- ok, BS=119, Alb=3.4;  CBC- Hg=10.9 after 2u PCs 12/05/14 for Hg=7.6  PLAN>>  We discussed optimizing his bronchodil, anti-inflamm, & mucolytic rx w/ DUONEB QID regularly, PRED taper (see AVS), and MUCINEX 60560mid +fluids; he will also take the KlRedfieldBid regularly and extra Ativan 0.60m28mrn;  He will f/u w/ me in 3-4 wks...    ~  January 17, 2015:  80mo70mo w/ SN>  RogeCorriganorts improved- he's been using the NEBULIZER w/ Duoneb 2-3 x daily, taking MucinexBid, Klonopin0.5Bid, & he finished the Pred taper (it helped); he has mild cough, sm amt beige sputum (less thick), no CP;  Using O2 2-3L/min which he adjusts up & down as needed;  He saw DrMohamed 2d ago & he walked on RA w/ desat to 85% he says;  Pt & wife tell me that DrMohamed insisted that [facility] NOT [facility] any [facility]  EXAM reveals Afeb, VSS, O2sat=94% on 2L/min;  HEENT- neg, mallampati1;  Chest- decr BS w/ few bibasilar wheezes and rhonchi;  Heart- RR gr1/6 SEM w/o r/g;  Abd- soft, neg;  Ext- w/o c/c/e... We reviewed prob list, meds, xrays and labs>   Spirometry 01/17/15 showed FVC=2.35 (53%), FEV1=1.11 (33%), %1sec=47, mid-flows are reduced at 13% predicted; c/w severe airflow obstruction & GOLD Stage 3-4 COPD... IMP/PLAN>>  Again asked him to do the Nebulizer w/ Duoneb Qid, Add PULMICORT twisthaler- 2spBid, take MUCINEX600-2Bid, continue cardiac meds, continue KlonopinBid... Continue Oxygen at 2-3L/min...   ~  July 31, 2015:  84mo 67mo& urgent add-on appt w/ SN per DrMohamed> [name] [facility] me [facility]- [facility] "They didn't do nothing, I didn't qualify"; today he is c/o increased SOB "since last weeks shot" (Neulasta); c/o cough, thick yellow sput, few blood streaks, sore throat, had nose bleed too, but no  f/c/s...    I reviewed interval notes from Oncology> last seen 07/29/15 & just finished cycle5 of Taxotere/cyramza w/ Neulasta support, s/p additional brain radiation 07/18/15, increased resp issues noted, incr edema on Lasix20/d;  CT Angio Chest 07/29/15 showed no emboli, post surg changes on right, no evid of recurrent malignancy, +mucus in trachea & sm effusions => refer to Pulm.  PRIOR THERAPY:  1) Status post right upper lobectomy under the care of Dr. Van TPrescott Gum3/12/2012.   2) Adjuvant chemotherapy with cisplatin 75 mg/M2 and docetaxel 75 mg/M2 with Neulasta support every 3 weeks, status post 1 cycle. Starting with cycle #2, patient will receive carboplatin for an AUC initially of 4.5 and paclitaxel at 175 mg per meter squared with Neulasta support given every 3 weeks. Status post a total of 3 cycles.   3) Systemic chemotherapy with carboplatin for AUC of 5 on day 1 and gemcitabine 1000 MG/M2 on days 1 and 8 every 3 weeks. First dose 09/11/2014. Status  post 4 cycles. Discontinued secondary to intolerance  4) Immunotherapy with Nivolumab 3 MG/KG every 2 weeks. Status post 4 cycles.  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 Completed his radiation treatments on 06/20/15  6. new left inferior frontal tumor radiation on 07/04/15, to a dose of 20 gray in 1 fraction, to be completed Friday 1/6  CURRENT THERAPY: Systemic chemotherapy with docetaxel 75 MG/M2 and Cyramza 10 MG/KG every 3 weeks with Neulasta support. 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    HBP & ASHD> followed by [facility], his notes reviewed, on ECASA81, Imdur30, Metop25/d, Amlod10, Lisin40, Lasix20; BP= 134/60 and he is stable from the CV standpoint..    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    GERD> on Prilosec40    RLS> on Mirapex '1mg'$  Qhs...    Anxiety/ insomnia> on Klonopin0.5Bid, Ambien10 prn...    Anemia> Labs by DrMohammed 07/2015 showed Hg= 10-11 range... EXAM reveals Afeb, VSS, O2sat=97% on 2L/min;  HEENT- neg, mallampati1;  Chest- decr BS w/ few bibasilar wheezes and rhonchi;  Heart- RR gr1/6 SEM w/o r/g;  Abd- soft, neg;  Ext- w/o c/c/e...  SPUTUM C&S => grew NTF only IMP/PLAN>>  We had a long discussion regarding his underlying COPD & the need for regular regimen w/ NEBS (duoneb) Qid, Mucinex '1200mg'$  Bid, & we are prescribing LEVAQUIN750/d + Depo80+Pred'20mg'$ -5d tapering sched (see AVS); in addition we are giving him contingency meds- MMW, nasal saline, etc... He will f/u w/ me in 3wks.    PCP= Dr. Georga Bora Past Medical History  Diagnosis Date  . Arthritis   . Wheezing   . Sore throat   . Carotid artery occlusion   . COPD (chronic obstructive pulmonary disease) ([place])   . Lumbar disc disease   . Restless leg syndrome   . Allergic rhinitis   . Hypertension   . Anxiety     anxiety  . Shortness of breath   . Lung mass   . GERD (gastroesophageal reflux disease)   . H/O hiatal hernia   . Pre-operative cardiovascular examination   . Nonspecific abnormal unspecified cardiovascular function study   . Peripheral vascular disease, unspecified ([place])   . Pneumonia 2014    ?   Marland Kitchen On home oxygen therapy     prn  . Constipation   . Cancer ([place]) 09/14/12    invasive mod diff squamous cell ca  . 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 S/P radiation therapy 12/04/14 srs    lt frontal brain    Past Surgical History   Procedure Laterality Date  . Carotid endarterectomy  10/15/2010    left  . Hand surgery      repair of the left long, ring, and small finger after a saw injury  . Video bronchoscopy  07/24/2012    Procedure: VIDEO BRONCHOSCOPY WITH FLUORO;  Surgeon: Kathee Delton, MD;  Location: [facility];  Service: Cardiopulmonary;  Laterality:  Bilateral;  . Lobectomy Right 09/14/2012    Procedure: LOBECTOMY;  Surgeon: Kerin Perna, MD;  Location: [facility] OR;  Service: Thoracic;  Laterality: Right;  RIGHT UPPER LOBECTOMY  . Video assisted thoracoscopy Right 09/14/2012    Procedure: VIDEO ASSISTED THORACOSCOPY;  Surgeon: Kerin Perna, MD;  Location: [facility] OR;  Service: Thoracic;  Laterality: Right;  . Video bronchoscopy with endobronchial ultrasound N/A 08/27/2014    Procedure: VIDEO BRONCHOSCOPY WITH ENDOBRONCHIAL ULTRASOUND;  Surgeon: Kerin Perna, MD;  Location: [facility] OR;  Service: Thoracic;  Laterality: N/A;  . Scalene node biopsy Right 08/27/2014    Procedure: BIOPSY SCALENE NODE;  Surgeon: Kerin Perna, MD;  Location: [facility] OR;  Service: Thoracic;  Laterality: Right;  . Sterotactic radiosurgery Right 09/25/14    right parietal 30Gy/1 fx    Outpatient Encounter Prescriptions as of 01/17/2015  Medication Sig  . amLODipine (NORVASC) 10 MG tablet Take 1 tablet (10 mg total) by mouth daily.  Marland Kitchen aspirin EC 81 MG tablet Take 81 mg by mouth daily.   . clonazePAM (KLONOPIN) 0.5 MG tablet Take 0.5 mg by mouth 2 (two) times daily as needed for anxiety. Takes one tablet by mouth two times daily as needed for anxiety.  Marland Kitchen HYDROcodone-acetaminophen (NORCO) 7.5-325 MG per tablet Take 1 tablet by mouth every 6 (six) hours as needed for moderate pain.  Marland Kitchen ipratropium-albuterol (DUONEB) 0.5-2.5 (3) MG/3ML SOLN Take 3 mLs by nebulization 4 (four) times daily.  . isosorbide mononitrate (IMDUR) 30 MG 24 hr tablet   . LORazepam (ATIVAN) 0.5 MG tablet Take 1 tablet by mouth or sublingually every 8 hours as needed for nausea. May take  1 tablet by mouth at bedtime.  . metoprolol tartrate (LOPRESSOR) 25 MG tablet Take 25 mg by mouth 2 (two) times daily.   Marland Kitchen omeprazole (PRILOSEC) 40 MG capsule Take 40 mg by mouth daily.  . pramipexole (MIRAPEX) 1 MG tablet Take 1 mg by mouth at bedtime.  . traMADol (ULTRAM) 50 MG tablet Take 1 tablet (50 mg total) by mouth every 12 (twelve) hours as needed.  . zolpidem (AMBIEN) 10 MG tablet Take 10 mg by mouth at bedtime as needed for sleep.  . potassium chloride SA (K-DUR,KLOR-CON) 20 MEQ tablet Take 1 tablet (20 mEq total) by mouth 2 (two) times daily. (Patient not taking: Reported on 01/17/2015)  . predniSONE (DELTASONE) 20 MG tablet Use as directed by physician (Patient not taking: Reported on 01/17/2015)    No Known Allergies   Current Medications, Allergies, Past Medical History, Past Surgical History, Family History, and Social History were reviewed in [employer] record.   Review of Systems  Constitutional: Positive for fatigue. Negative for fever and unexpected weight change.  HENT: Positive for congestion. Negative for dental problem, ear pain, nosebleeds, postnasal drip, rhinorrhea, sinus pressure, sneezing, sore throat and trouble swallowing.   Eyes: Negative for redness and itching.  Respiratory: Positive for cough, chest tightness, shortness of breath and wheezing.   Cardiovascular: Negative for palpitations and leg swelling.  Gastrointestinal: Negative for nausea and vomiting.  Genitourinary: Negative for dysuria.  Musculoskeletal: Negative for joint swelling.  Skin: Negative for rash.  Neurological: Positive for dizziness, weakness and headaches. Negative for seizures and syncope.  Hematological: Positive for adenopathy. Does not bruise/bleed easily.  Psychiatric/Behavioral: Positive for sleep disturbance and dysphoric mood. The patient is nervous/anxious.       Objective:   Physical Exam  Vital Signs:  Reviewed...  General:  WD, WN, 74  y/o WM chr  ill appearing, in NAD; alert & oriented; pleasant & cooperative... HEENT:  [place]/AT; Conjunctiva- pink, Sclera- nonicteric, EOM-wnl, PERRLA, EACs-clear, TMs-wnl; NOSE-clear; THROAT-clear & wnl. Neck:  Supple w/ fair ROM; no JVD; s/p LCAE, faint R bruit; no thyromegaly or nodules palpated; + right supraclav lymphadenopathy. Chest:  Decr BS w/ few rhonchi & wheezes w/o consolidation Heart:  Regular Rhythm; Gr1/6 SEM, w/o rubs or gallops detected. Abdomen:  Soft & nontender- no guarding or rebound; normal bowel sounds; no organomegaly or masses palpated. Ext:  Normal ROM; without deformities, mild arthritic changes; no varicose veins, venous insuffic, or edema;  Pulses intact w/o bruits. Neuro:  No focal neuro deficits, gait normal & balance OK. Derm:  No lesions noted; no rash etc. Lymph:  + right supraclavicular node palpated.     Assessment & Plan:    Severe COPD (GOLD Stage 3-4), quit smoking 09/2014> asked to use the NEB w/ Duoneb Qid, continue MUCINEX-2Bid, Oxygen at 2-3L/min... 1/19> Rx w/ [id number], Depo80+Pred taper; checked sput C&S=> NTF only...  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  HBP & ASHD> followed by [facility], his notes reviewed, on ECASA81, Metop25/d, Amlod10; BP= 150/80 and he knows that he can take an extra Metop25 daily if needed for BP...  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  GERD> on Protionix40  RLS> on Mirapex '1mg'$  Qhs...  Anxiety/ insomnia> on Klonopin0.5Bid, Ativan0.5 prn, Ambien10 prn...  Anemia> Labs by DrMohammed 12/05/14 showed Hg=7.6 & he was Tx 2u w/ Hg improved to 10.9 by 12/11/14...    Patient's Medications  New Prescriptions   BUDESONIDE (PULMICORT FLEXHALER) 180 MCG/ACT INHALER    Inhale 2 puffs into the  lungs 2 (two) times daily.   BUDESONIDE (PULMICORT FLEXHALER) 180 MCG/ACT INHALER    Inhale 2 puffs into the lungs 2 (two) times daily.  Previous Medications   ALBUTEROL (PROVENTIL) (2.5 MG/3ML) 0.083% NEBULIZER SOLUTION   Use this up & switch to Duoneb Qid...   AMLODIPINE (NORVASC) 10 MG TABLET    Take 1 tablet (10 mg total) by mouth daily.   ASPIRIN EC 81 MG TABLET    Take 81 mg by mouth daily.    CLONAZEPAM (KLONOPIN) 0.5 MG TABLET    Take 0.5 mg by mouth 2 (two) times daily as needed for anxiety. Takes one tablet by mouth two times daily as needed for anxiety.   HYDROCODONE-ACETAMINOPHEN (NORCO) 7.5-325 MG PER TABLET    Take 1 tablet by mouth every 6 (six) hours as needed for moderate pain.   IPRATROPIUM (ATROVENT) 0.02 % NEBULIZER SOLUTION Use this up & switch to DUONEB Qid...   IPRATROPIUM-ALBUTEROL (DUONEB) 0.5-2.5 (3) MG/3ML SOLN    Use in nebulizer Qid...   ISOSORBIDE MONONITRATE (IMDUR) 30 MG 24 HR TABLET       LORAZEPAM (ATIVAN) 0.5 MG TABLET    Take 1 tablet by mouth or sublingually every 8 hours as needed for nausea. May take 1 tablet by mouth at bedtime.   METOPROLOL TARTRATE (LOPRESSOR) 25 MG TABLET    Take 25 mg by mouth 2 (two) times daily.    OMEPRAZOLE (PRILOSEC) 40 MG CAPSULE    Take 40 mg by mouth daily.   POTASSIUM CHLORIDE SA (K-DUR,KLOR-CON) 20 MEQ TABLET    Take 1 tablet (20 mEq total) by mouth 2 (two)  times daily.   PRAMIPEXOLE (MIRAPEX) 1 MG TABLET    Take 1 mg by mouth at bedtime.   TRAMADOL (ULTRAM) 50 MG TABLET    Take 1 tablet (50 mg total) by mouth every 12 (twelve) hours as needed.   ZOLPIDEM (AMBIEN) 10 MG TABLET    Take 10 mg by mouth at bedtime as needed for sleep.  Modified Medications   No medications on file  Discontinued Medications   No medications on file

## 2015-08-29 NOTE — Progress Notes (Signed)
Subjective:    Patient ID: [name], male    DOB: 30-Aug-1940, 75 y.o.   MRN: [id number]  HPI  Baseline PFT 11/2011 showed FVC=3.17 (72%), FEV1=1.90 (63%), %1sec=60, mid-flows reduced at 32% predicted; 3% improvement in FEV1 after bronchodil; air trapping on LVs and DLCO was wnl...   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   3/14> he had RULobectomy by Dr. 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  He saw DrMohamed in consult & was quoted a 5-10% 4yrsurvival w/ 4 cycles of adjuvant chemotherapy => completed 12/2012...  He remained disease free for 1.5 yrs, but continued smoking...   F/u CT Chest 07/2014 showed new pulm nodules and extensive adenopathy- mediastinal, hilar, axillary...  Subseq PET scan 1/16 showed pos for widespread lymphangitic dis in both lungs, bilat hilar/ mediastinal/ right axillary, right supraclavic nodes, and right adrenal gland...  MRI Br 1/16 showed small enhancing lesion in right parietal lobe...  DrMohamed referred to Dr. VPrescott Gumfor bronch w/ EUS bx of mediastinal nodes=> pos EBUS and scalene node bx w/ metastatic squamous cell cancer.  3/16> DrM indicated to pt that his disease is incurable & further treatment palliative => they proceeded w/ further chemotherapy, 4 cycles completed 5/16...  He saw DrMoody for XRT 3/16 due to HAs & they gave 20Gy radiation in 1 fraction but HAs continued...  5/16> repeat MRI Brain showed resolution of small right parietal lesion , but new 344mleft frontal  lesion & acute punctate small vessel infarct in the left centrum semiovale => one more dose of 20Gy radiation given 12/04/14...  F/u CT Chest/ Abd/ Pelvis 12/06/14 showed a mixed response to therapy w/ innumerable pulm nodules- some same or smaller, but nodes same or larger, liver ok but right adrenal met larger, & right kidney lesion is suspicious for met; vascular & coronary calcif, extensive spondylosis in lumbar spine...   DrMohamed offered the pt Opdivo immunotherapy & 1st dose given 12/18/14...   ~  December 18, 2014:  Add-on appt w/ SN for increased SOB & cough> pt c/o several month hx of increased SOB, cough, thick beige sput & intermittent wheezing, no hemoptysis; now on Opdivo for lung cancer palliative rx- I reviewed his hx in detail & went over his recent CT Chest/ Abd Pelvis results (their understanding was minimal & he does not have a grasp of the prognosis- eg wife was surprised about the atherosclerosis & wanted to know what could be done about this)...    COPD, quit smoking 09/2014> on O2 at 2-3L/min, NEBS w/ Albut prn; he is very congested w/ bilat rhonchi & wheezing; we discussed Rx w/ DUONEB Qid, MUCINEX Qid + fluids, and Depo80+PRED taper...    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    HBP & ASHD> followed by DrVaranasi, his notes reviewed, on ECASA81, Metop25/d, Amlod10; BP= 150/80 and he knows that he can take an extra Metop25 daily if  needed for BP...    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    GERD> on Protionix40    RLS> on Mirapex 86m Qhs...    Anxiety/ insomnia> on Klonopin0.5Bid, Ativan0.5 prn, Ambien10 prn...    Anemia> Labs by DrMohammed 12/05/14 showed Hg=7.6 & he was Tx 2u w/ Hg improved to 10.9 by 12/11/14...  We reviewed prob list, meds, xrays and labs>    CXR 12/05/14 showed norm heart size, diffuse interstitial coarsening & nodularity w/ scarring, s/p right lung surg, etc...  LABS 6/16: reviewed in Epic:  Chems- ok, BS=119, Alb=3.4;  CBC- Hg=10.9 after 2u PCs 12/05/14 for Hg=7.6  PLAN>>  We discussed optimizing his bronchodil, anti-inflamm, & mucolytic rx w/ DUONEB QID regularly, PRED taper (see AVS), and MUCINEX 60560mid +fluids; he will also take the KlRedfieldBid regularly and extra Ativan 0.60m28mrn;  He will f/u w/ me in 3-4 wks...    ~  January 17, 2015:  80mo70mo w/ SN>  RogeCorriganorts improved- he's been using the NEBULIZER w/ Duoneb 2-3 x daily, taking MucinexBid, Klonopin0.5Bid, & he finished the Pred taper (it helped); he has mild cough, sm amt beige sputum (less thick), no CP;  Using O2 2-3L/min which he adjusts up & down as needed;  He saw DrMohamed 2d ago & he walked on RA w/ desat to 85% he says;  Pt & wife tell me that DrMohamed insisted that [facility] NOT [facility] any [facility]  EXAM reveals Afeb, VSS, O2sat=94% on 2L/min;  HEENT- neg, mallampati1;  Chest- decr BS w/ few bibasilar wheezes and rhonchi;  Heart- RR gr1/6 SEM w/o r/g;  Abd- soft, neg;  Ext- w/o c/c/e... We reviewed prob list, meds, xrays and labs>   Spirometry 01/17/15 showed FVC=2.35 (53%), FEV1=1.11 (33%), %1sec=47, mid-flows are reduced at 13% predicted; c/w severe airflow obstruction & GOLD Stage 3-4 COPD... IMP/PLAN>>  Again asked him to do the Nebulizer w/ Duoneb Qid, Add PULMICORT twisthaler- 2spBid, take MUCINEX600-2Bid, continue cardiac meds, continue KlonopinBid... Continue Oxygen at 2-3L/min...   ~  July 31, 2015:  84mo 67mo& urgent add-on appt w/ SN per DrMohamed> [name] to me [facility]- [facility] "They didn't do nothing, I didn't qualify"; today he is c/o increased SOB "since last weeks shot" (Neulasta); c/o cough, thick yellow sput, few blood streaks, sore throat, had nose bleed too, but no  f/c/s...    I reviewed interval notes from Oncology> last seen 07/29/15 & just finished cycle5 of Taxotere/cyramza w/ Neulasta support, s/p additional brain radiation 07/18/15, increased resp issues noted, incr edema on Lasix20/d;  CT Angio Chest 07/29/15 showed no emboli, post surg changes on right, no evid of recurrent malignancy, +mucus in trachea & sm effusions => refer to Pulm.  PRIOR THERAPY:  1) Status post right upper lobectomy under the care of Dr. Van TPrescott Gum3/12/2012.   2) Adjuvant chemotherapy with cisplatin 75 mg/M2 and docetaxel 75 mg/M2 with Neulasta support every 3 weeks, status post 1 cycle. Starting with cycle #2, patient will receive carboplatin for an AUC initially of 4.5 and paclitaxel at 175 mg per meter squared with Neulasta support given every 3 weeks. Status post a total of 3 cycles.   3) Systemic chemotherapy with carboplatin for AUC of 5 on day 1 and gemcitabine 1000 MG/M2 on days 1 and 8 every 3 weeks. First dose 09/11/2014. Status  post 4 cycles. Discontinued secondary to intolerance  4) Immunotherapy with Nivolumab 3 MG/KG every 2 weeks. Status post 4 cycles.  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 Completed his radiation treatments on 06/20/15  6. new left inferior frontal tumor radiation on 07/04/15, to a dose of 20 gray in 1 fraction, to be completed Friday 1/6  CURRENT THERAPY: Systemic chemotherapy with docetaxel 75 MG/M2 and Cyramza 10 MG/KG every 3 weeks with Neulasta support. 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    HBP & ASHD> [facility] by [facility], his notes reviewed, on ECASA81, Imdur30, Metop25/d, Amlod10, Lisin40, Lasix20; BP= 134/60 and he is stable from the CV standpoint..    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    GERD> on Prilosec40    RLS> on Mirapex 67m Qhs...    Anxiety/ insomnia> on Klonopin0.5Bid, Ambien10 prn...    Anemia> Labs by DrMohammed 07/2015 showed Hg= 10-11 range... EXAM reveals Afeb, VSS, O2sat=97% on 2L/min;  HEENT- neg, mallampati1;  Chest- decr BS w/ few bibasilar wheezes and rhonchi;  Heart- RR gr1/6 SEM w/o r/g;  Abd- soft, neg;  Ext- w/o c/c/e...  SPUTUM C&S => grew NTF only IMP/PLAN>>  We had a long discussion regarding his underlying COPD & the need for regular regimen w/ NEBS (duoneb) Qid, Mucinex 12012mBid, & we are prescribing LEVAQUIN750/d + Depo80+Pred2029md tapering sched (see AVS); in addition we are giving him contingency meds- MMW, nasal saline, etc... He will f/u w/ me in 3wks.   ~  August 27, 2015:  2mo64mo w/ SN>  Last visit we checked Sput C&S=> NTF only & treated him empirically w/ [id number]=> still on 20mg31m he is again using the NEBS only Bid and stopped the Mucinex (says his throat felt better off this med); I reviewed w/ him again the need for an aggressive bronchodilator regimen => O2 at 2-3L/min 24/7, NEBS w/ duoneb Qid, Mucinex 600Qid w/ fluids; we discussed weaning the Pred down to 10mg/40m this point;  His wife notes that "the chemo has knocked him for a bad loop" (last dose of Taxotere/ Cyramza/ Neulasta was 2/1-08/14/15);  He is also c/o constipation & we discussed Rx w/ Probiotic daily, Miralax 1-2 times daily, Senakot-S 1-2 Qhs...  EXAM shows Afeb, VSS, O2sat=98% on 1L/min;  HEENT- neg, mallampati1;  Chest- decr BS w/ few bibasilar rhonchi, no wheezing/ rales/ consolidation;  Heart- RR gr1/6 SEM w/o  r/g;  Abd- soft, neg;  Ext- tr edema w/o c/c...   LABS 08/2015 per DrMohamed reviewed... IMP/PLAN>>  Cortlan Helmutevere COPD and Stage 4 lung cancer- by all criteria he has done remarkably well on the palliative Chemo/ XRT regimen per DrMohamed;  He is encouraged to use the NEB w/ Duoneb Qid regularly, take the Mucinex-600Qid for the mucus/ congestion, and we will decrease his Pred from 20mg/d60mo 10mg/d 87mret in ~6wks...    PCP= Dr. Bo FriedGeorga Boradical History  Diagnosis  Date  . Arthritis   . Wheezing   . Sore throat   . Carotid artery occlusion   . COPD (chronic obstructive pulmonary disease) ([place])   . Lumbar disc disease   . Restless leg syndrome   . Allergic rhinitis   . Hypertension   . Anxiety     anxiety  . Shortness of breath   . Lung mass   . GERD (gastroesophageal reflux disease)   . H/O hiatal hernia   . Pre-operative cardiovascular examination   . Nonspecific abnormal unspecified cardiovascular function study   . Peripheral vascular disease, unspecified ([place])   . Pneumonia 2014    ?   Marland Kitchen On home oxygen therapy     prn  . Constipation   . Cancer ([place]) 09/14/12    invasive mod diff squamous cell ca  . 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 S/P radiation therapy 12/04/14 srs    lt frontal brain    Past Surgical History  Procedure Laterality Date  . Carotid endarterectomy  10/15/2010    left  . Hand surgery      repair of the left long, ring, and small finger after a saw injury  . Video bronchoscopy  07/24/2012    Procedure: VIDEO BRONCHOSCOPY WITH FLUORO;  Surgeon: Kathee Delton, MD;  Location: [facility];  Service: Cardiopulmonary;  Laterality: Bilateral;  . Lobectomy Right 09/14/2012    Procedure: LOBECTOMY;  Surgeon: Ivin Poot, MD;  Location: [place];  Service: Thoracic;  Laterality: Right;  RIGHT UPPER LOBECTOMY  . Video assisted thoracoscopy Right 09/14/2012    Procedure: VIDEO ASSISTED THORACOSCOPY;  Surgeon: Ivin Poot, MD;   Location: [place];  Service: Thoracic;  Laterality: Right;  . Video bronchoscopy with endobronchial ultrasound N/A 08/27/2014    Procedure: VIDEO BRONCHOSCOPY WITH ENDOBRONCHIAL ULTRASOUND;  Surgeon: Ivin Poot, MD;  Location: [place];  Service: Thoracic;  Laterality: N/A;  . Scalene node biopsy Right 08/27/2014    Procedure: BIOPSY SCALENE NODE;  Surgeon: Ivin Poot, MD;  Location[facility];  Service: Thoracic;  Laterality: Right;  . Sterotactic radiosurgery Right 09/25/14    right parietal 30Gy/1 fx    Outpatient Encounter Prescriptions as of 08/27/2015  Medication Sig  . amLODipine (NORVASC) 10 MG tablet Take 5 mg by mouth daily. Takes half a tablet  . aspirin EC 81 MG tablet Take 81 mg by mouth daily.   . clonazePAM (KLONOPIN) 0.5 MG tablet Take 0.5 mg by mouth 2 (two) times daily as needed for anxiety. Takes one tablet by mouth two times daily as needed for anxiety.  Marland Kitchen dexamethasone (DECADRON) 4 MG tablet 2 tablets by mouth twice a day the day before, day of and day after the chemotherapy every 3 weeks.  . furosemide (LASIX) 20 MG tablet Change to Lasix 20 mg BID x 7 days; then return to 20 mg Q AM.  . guaiFENesin (MUCINEX) 600 MG 12 hr tablet Take 1,200 mg by mouth 2 (two) times daily. Reported on 06/30/2015  . HYDROcodone-acetaminophen (NORCO) 7.5-325 MG tablet Take 1 tablet by mouth every 6 (six) hours as needed for moderate pain.  Marland Kitchen ipratropium-albuterol (DUONEB) 0.5-2.5 (3) MG/3ML SOLN Take 3 mLs by nebulization 4 (four) times daily. (Patient taking differently: Take 3 mLs by nebulization 4 (four) times daily. Pt takes nebulizer 2 to 3 times daily.)  . isosorbide mononitrate (IMDUR) 30 MG 24 hr tablet Take 1 tablet (30 mg total) by mouth  daily.  . lisinopril (PRINIVIL,ZESTRIL) 40 MG tablet Take 40 mg by mouth daily.  . metoprolol tartrate (LOPRESSOR) 25 MG tablet Take 25 mg by mouth 2 (two) times daily.   Marland Kitchen omeprazole (PRILOSEC) 40 MG capsule Take 40 mg by mouth daily.  . pramipexole  (MIRAPEX) 1 MG tablet Take 1 mg by mouth at bedtime.  . prochlorperazine (COMPAZINE) 10 MG tablet Take 10 mg by mouth every 6 (six) hours as needed for nausea or vomiting. Reported on 06/30/2015  . traMADol (ULTRAM) 50 MG tablet Take 1 tablet (50 mg total) by mouth every 12 (twelve) hours as needed.  . zolpidem (AMBIEN) 10 MG tablet Take 10 mg by mouth at bedtime as needed for sleep. Reported on 06/30/2015  . [DISCONTINUED] predniSONE (DELTASONE) 20 MG tablet Take as directed (Patient taking differently: Take 20 mg by mouth daily with breakfast. Take as directed)  . predniSONE (DELTASONE) 10 MG tablet Take 1 tablet (10 mg total) by mouth daily with breakfast.  . [DISCONTINUED] Diphenhyd-Hydrocort-Nystatin (FIRST-DUKES MOUTHWASH) SUSP Swish and swallow 3 times a day as needed (Patient not taking: Reported on 08/18/2015)  . [DISCONTINUED] levofloxacin (LEVAQUIN) 750 MG tablet Take 1 tablet (750 mg total) by mouth daily. (Patient not taking: Reported on 08/18/2015)   Facility-Administered Encounter Medications as of 08/27/2015  Medication  . sodium chloride 0.9 % injection 10 mL    No Known Allergies   Current Medications, Allergies, Past Medical History, Past Surgical History, Family History, and Social History were reviewed in [employer] record.   Review of Systems  Constitutional: Positive for fatigue. Negative for fever and unexpected weight change.  HENT: Positive for congestion. Negative for dental problem, ear pain, nosebleeds, postnasal drip, rhinorrhea, sinus pressure, sneezing, sore throat and trouble swallowing.   Eyes: Negative for redness and itching.  Respiratory: Positive for cough, chest tightness, shortness of breath and wheezing.   Cardiovascular: Negative for palpitations and leg swelling.  Gastrointestinal: Negative for nausea and vomiting.  Genitourinary: Negative for dysuria.  Musculoskeletal: Negative for joint swelling.  Skin: Negative for rash.   Neurological: Positive for dizziness, weakness and headaches. Negative for seizures and syncope.  Hematological: Positive for adenopathy. Does not bruise/bleed easily.  Psychiatric/Behavioral: Positive for sleep disturbance and dysphoric mood. The patient is nervous/anxious.       Objective:   Physical Exam  Vital Signs:  Reviewed...  General:  WD, WN, 75 y/o WM chr ill appearing, in NAD; alert & oriented; pleasant & cooperative... HEENT:  [place]/AT; Conjunctiva- pink, Sclera- nonicteric, EOM-wnl, PERRLA, EACs-clear, TMs-wnl; NOSE-clear; THROAT-clear & wnl. Neck:  Supple w/ fair ROM; no JVD; s/p LCAE, faint R bruit; no thyromegaly or nodules palpated; + right supraclav lymphadenopathy. Chest:  Decr BS w/ few rhonchi & wheezes w/o consolidation Heart:  Regular Rhythm; Gr1/6 SEM, w/o rubs or gallops detected. Abdomen:  Soft & nontender- no guarding or rebound; normal bowel sounds; no organomegaly or masses palpated. Ext:  Normal ROM; without deformities, mild arthritic changes; no varicose veins, venous insuffic, or edema;  Pulses intact w/o bruits. Neuro:  No focal neuro deficits, gait normal & balance OK. Derm:  No lesions noted; no rash etc. Lymph:  + right supraclavicular node palpated.     Assessment & Plan:    Severe COPD (GOLD Stage 3-4), quit smoking 09/2014> asked to use the NEB w/ Duoneb Qid, continue MUCINEX-2Bid, Oxygen at 2-3L/min... 1/19> Rx w/ [id number], Depo80+Pred taper; checked sput C&S=> NTF only... 2/15> He is improved after Levaquin/ Pred taper;  now reminded to use the NEBS Qid, Mucinex Qid, & cut the Pred to 92m Qam til rov...  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  HBP & ASHD> followed by DrVaranasi, his notes reviewed, on ECASA81, Imdur30, Metop25/d, Amlod10, Lisin40, Lasix20; BP= 150/70 and he knows that he  can take an extra Metop25 daily if needed for BP...  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  GERD> on Prilosec40  RLS> on Mirapex 158mQhs...  Anxiety/ insomnia> on Klonopin0.5Bid, Ambien10 prn...  Anemia> Labs by DrMohammed reviewed...   Patient's Medications  New Prescriptions   PREDNISONE (DELTASONE) 10 MG TABLET    Take 1 tablet (10 mg total) by mouth daily with breakfast.  Previous Medications   AMLODIPINE (NORVASC) 10 MG TABLET    Take 5 mg by mouth daily. Takes half a tablet   ASPIRIN EC 81 MG TABLET    Take 81 mg by mouth daily.    CLONAZEPAM (KLONOPIN) 0.5 MG TABLET    Take 0.5 mg by mouth 2 (two) times daily as needed for anxiety. Takes one tablet by mouth two times daily as needed for anxiety.   DEXAMETHASONE (DECADRON) 4 MG TABLET    2 tablets by mouth twice a day the day before, day of and day after the chemotherapy every 3 weeks.   FUROSEMIDE (LASIX) 20 MG TABLET    Change to Lasix 20 mg BID x 7 days; then return to 20 mg Q AM.   GUAIFENESIN (MUCINEX) 600 MG 12 HR TABLET    Take 1,200 mg by mouth 2 (two) times daily. Reported on 06/30/2015   HYDROCODONE-ACETAMINOPHEN (NORCO) 7.5-325 MG TABLET    Take 1 tablet by mouth every 6 (six) hours as needed for moderate pain.   IPRATROPIUM-ALBUTEROL (DUONEB) 0.5-2.5 (3) MG/3ML SOLN    Take 3 mLs by nebulization 4 (four) times daily.   ISOSORBIDE MONONITRATE (IMDUR) 30 MG 24 HR TABLET    Take 1 tablet (30 mg total) by mouth daily.   LISINOPRIL (PRINIVIL,ZESTRIL) 40 MG TABLET    Take 40 mg by mouth daily.   METOPROLOL TARTRATE (LOPRESSOR) 25 MG TABLET    Take 25 mg by mouth 2 (two) times daily.    OMEPRAZOLE (PRILOSEC) 40 MG CAPSULE    Take 40 mg by mouth daily.   PRAMIPEXOLE (MIRAPEX) 1 MG TABLET    Take 1 mg by mouth at bedtime.   PROCHLORPERAZINE (COMPAZINE) 10 MG TABLET    Take 10 mg by mouth every 6 (six) hours as needed for nausea or vomiting.  Reported on 06/30/2015   TRAMADOL (ULTRAM) 50 MG TABLET    Take 1 tablet (50 mg total) by mouth every 12 (twelve) hours as needed.   ZOLPIDEM (AMBIEN) 10 MG TABLET    Take 10 mg by mouth at bedtime as needed for sleep. Reported on 06/30/2015  Modified Medications   No medications on file  Discontinued Medications   DIPHENHYD-HYDROCORT-NYSTATIN (FIRST-DUKES MOUTHWASH) SUSP    Swish and swallow 3 times a day as needed   LEVOFLOXACIN (LEVAQUIN) 750 MG TABLET    Take 1 tablet (750 mg total) by mouth daily.   PREDNISONE (DELTASONE) 20 MG TABLET    Take as directed

## 2015-09-01 ENCOUNTER — Telehealth: Payer: Self-pay | Admitting: Medical Oncology

## 2015-09-01 NOTE — Telephone Encounter (Signed)
CMN for oxygen signed and faxed back to [facility]

## 2015-09-03 ENCOUNTER — Encounter: Payer: Self-pay | Admitting: Internal Medicine

## 2015-09-03 ENCOUNTER — Other Ambulatory Visit (HOSPITAL_BASED_OUTPATIENT_CLINIC_OR_DEPARTMENT_OTHER): Payer: Medicare Other

## 2015-09-03 ENCOUNTER — Ambulatory Visit (HOSPITAL_BASED_OUTPATIENT_CLINIC_OR_DEPARTMENT_OTHER): Payer: Medicare Other | Admitting: Internal Medicine

## 2015-09-03 ENCOUNTER — Ambulatory Visit (HOSPITAL_BASED_OUTPATIENT_CLINIC_OR_DEPARTMENT_OTHER): Payer: Medicare Other

## 2015-09-03 VITALS — BP 179/69 | HR 91 | Temp 97.4°F | Resp 19 | Ht 69.0 in | Wt 176.0 lb

## 2015-09-03 VITALS — BP 146/80

## 2015-09-03 DIAGNOSIS — C3491 Malignant neoplasm of unspecified part of right bronchus or lung: Secondary | ICD-10-CM

## 2015-09-03 DIAGNOSIS — Z5112 Encounter for antineoplastic immunotherapy: Secondary | ICD-10-CM | POA: Diagnosis present

## 2015-09-03 DIAGNOSIS — C3411 Malignant neoplasm of upper lobe, right bronchus or lung: Secondary | ICD-10-CM

## 2015-09-03 DIAGNOSIS — Z5111 Encounter for antineoplastic chemotherapy: Secondary | ICD-10-CM

## 2015-09-03 DIAGNOSIS — C7931 Secondary malignant neoplasm of brain: Secondary | ICD-10-CM | POA: Diagnosis not present

## 2015-09-03 LAB — COMPREHENSIVE METABOLIC PANEL
ALT: 12 U/L (ref 0–55)
AST: 14 U/L (ref 5–34)
Albumin: 3.1 g/dL — ABNORMAL LOW (ref 3.5–5.0)
Alkaline Phosphatase: 67 U/L (ref 40–150)
Anion Gap: 7 mEq/L (ref 3–11)
BUN: 18.5 mg/dL (ref 7.0–26.0)
CHLORIDE: 102 meq/L (ref 98–109)
CO2: 29 meq/L (ref 22–29)
CREATININE: 1 mg/dL (ref 0.7–1.3)
Calcium: 9.1 mg/dL (ref 8.4–10.4)
EGFR: 77 mL/min/{1.73_m2} — ABNORMAL LOW (ref >90)
GLUCOSE: 87 mg/dL (ref 70–140)
POTASSIUM: 3.8 meq/L (ref 3.5–5.1)
SODIUM: 138 meq/L (ref 136–145)
Total Bilirubin: <0.3 mg/dL (ref 0.20–1.20)
Total Protein: 6.2 g/dL — ABNORMAL LOW (ref 6.4–8.3)

## 2015-09-03 LAB — CBC WITH DIFFERENTIAL/PLATELET
BASO%: 0.4 % (ref 0.0–2.0)
Basophils Absolute: 0 10*3/uL (ref 0.0–0.1)
EOS%: 0.2 % (ref 0.0–7.0)
Eosinophils Absolute: 0 10*3/uL (ref 0.0–0.5)
HEMATOCRIT: 33.8 % — AB (ref 38.4–49.9)
HGB: 10.6 g/dL — ABNORMAL LOW (ref 13.0–17.1)
LYMPH#: 1.7 10*3/uL (ref 0.9–3.3)
LYMPH%: 31.2 % (ref 14.0–49.0)
MCH: 31.6 pg (ref 27.2–33.4)
MCHC: 31.4 g/dL — AB (ref 32.0–36.0)
MCV: 100.9 fL — ABNORMAL HIGH (ref 79.3–98.0)
MONO#: 0.8 10*3/uL (ref 0.1–0.9)
MONO%: 14.5 % — AB (ref 0.0–14.0)
NEUT#: 3 10*3/uL (ref 1.5–6.5)
NEUT%: 53.7 % (ref 39.0–75.0)
Platelets: 209 10*3/uL (ref 140–400)
RBC: 3.35 10*6/uL — AB (ref 4.20–5.82)
RDW: 16.3 % — ABNORMAL HIGH (ref 11.0–14.6)
WBC: 5.6 10*3/uL (ref 4.0–10.3)

## 2015-09-03 LAB — UA PROTEIN, DIPSTICK - CHCC: Protein, ur: NEGATIVE mg/dL

## 2015-09-03 MED ORDER — DOCETAXEL CHEMO INJECTION 160 MG/16ML
75.0000 mg/m2 | Freq: Once | INTRAVENOUS | Status: AC
  Administered 2015-09-03: 150 mg via INTRAVENOUS
  Filled 2015-09-03: qty 15

## 2015-09-03 MED ORDER — SODIUM CHLORIDE 0.9 % IJ SOLN
10.0000 mL | INTRAMUSCULAR | Status: DC | PRN
  Administered 2015-09-03: 10 mL
  Filled 2015-09-03: qty 10

## 2015-09-03 MED ORDER — DIPHENHYDRAMINE HCL 50 MG/ML IJ SOLN
50.0000 mg | Freq: Once | INTRAMUSCULAR | Status: AC
  Administered 2015-09-03: 50 mg via INTRAVENOUS

## 2015-09-03 MED ORDER — SODIUM CHLORIDE 0.9 % IV SOLN
Freq: Once | INTRAVENOUS | Status: AC
  Administered 2015-09-03: 12:00:00 via INTRAVENOUS

## 2015-09-03 MED ORDER — RAMUCIRUMAB CHEMO INJECTION 500 MG/50ML
10.0000 mg/kg | Freq: Once | INTRAVENOUS | Status: AC
  Administered 2015-09-03: 800 mg via INTRAVENOUS
  Filled 2015-09-03: qty 70

## 2015-09-03 MED ORDER — HEPARIN SOD (PORK) LOCK FLUSH 100 UNIT/ML IV SOLN
500.0000 [IU] | Freq: Once | INTRAVENOUS | Status: AC | PRN
  Administered 2015-09-03: 500 [IU]
  Filled 2015-09-03: qty 5

## 2015-09-03 MED ORDER — ACETAMINOPHEN 325 MG PO TABS
650.0000 mg | ORAL_TABLET | Freq: Once | ORAL | Status: AC
  Administered 2015-09-03: 650 mg via ORAL

## 2015-09-03 MED ORDER — SODIUM CHLORIDE 0.9 % IV SOLN
Freq: Once | INTRAVENOUS | Status: AC
  Administered 2015-09-03: 13:00:00 via INTRAVENOUS
  Filled 2015-09-03: qty 4

## 2015-09-03 MED ORDER — ACETAMINOPHEN 325 MG PO TABS
ORAL_TABLET | ORAL | Status: AC
  Filled 2015-09-03: qty 2

## 2015-09-03 MED ORDER — DIPHENHYDRAMINE HCL 50 MG/ML IJ SOLN
INTRAMUSCULAR | Status: AC
  Filled 2015-09-03: qty 1

## 2015-09-03 NOTE — Progress Notes (Signed)
[facility] [phone]   [phone]  OFFICE PROGRESS NOTE  [name], PA-C [place] [address]  DIAGNOSIS: Metastatic non-small cell lung cancer, squamous cell carcinoma initially diagnosed as Stage IIA (T2b., N0, M0) non-small cell lung cancer, squamous cell carcinoma diagnosed in December of 2013   PRIOR THERAPY:  1) Status post right upper lobectomy under the care of Dr. Prescott Gum on 09/14/2012.  2) Adjuvant chemotherapy with cisplatin 75 mg/M2 and docetaxel 75 mg/M2 with Neulasta support every 3 weeks, status post 1 cycle. Starting with cycle #2, patient will receive carboplatin for an AUC initially of 4.5 and paclitaxel at 175 mg per meter squared with Neulasta support given every 3 weeks. Status post a total of 3 cycles.  3) Systemic chemotherapy with carboplatin for AUC of 5 on day 1 and gemcitabine 1000 MG/M2 on days 1 and 8 every 3 weeks. First dose 09/11/2014. Status post 4 cycles. Discontinued secondary to intolerance 4) Immunotherapy with Nivolumab 3 MG/KG every 2 weeks. Status post 4 cycles. 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  CURRENT THERAPY: Systemic chemotherapy with docetaxel 75 MG/M2 and Cyramza 10 MG/KG every 3 weeks with Neulasta support. Status post 6 cycles  CHEMOTHERAPY INTENT: Palliative. CURRENT # OF CHEMOTHERAPY CYCLES: 7 CURRENT ANTIEMETICS: Zofran, dexamethasone and Compazine  CURRENT SMOKING STATUS: Quit smoking recently.  ORAL CHEMOTHERAPY AND CONSENT: None  CURRENT BISPHOSPHONATES USE: None  PAIN MANAGEMENT: 3/10 controlled by Norco when necessary  NARCOTICS INDUCED CONSTIPATION: Milk of magnesia on as-needed basis.  LIVING WILL AND CODE STATUS: No CODE BLUE.   INTERVAL HISTORY:  [name] 75 y.o. male returns to the clinic today for followup visit accompanied  By his wife. The patient is currently undergoing systemic chemotherapy  with docetaxel and Cyramza status post 6 cycles and has been tolerating his treatment fairly well with no significant complaints except for fatigue. He also continues to have shortness of breath and currently on home oxygen. He denied having any significant chest pain but has mild cough and no hemoptysis. He denied having any significant nausea or vomiting, no fever or chills, no weight loss or night sweats.  He is here today to start cycle #7 of his chemotherapy.   MEDICAL HISTORY: Past Medical History  Diagnosis Date  . Arthritis   . Wheezing   . Sore throat   . Carotid artery occlusion   . COPD (chronic obstructive pulmonary disease) ([place])   . Lumbar disc disease   . Restless leg syndrome   . Allergic rhinitis   . Hypertension   . Anxiety     anxiety  . Shortness of breath   . Lung mass   . GERD (gastroesophageal reflux disease)   . H/O hiatal hernia   . Pre-operative cardiovascular examination   . Nonspecific abnormal unspecified cardiovascular function study   . Peripheral vascular disease, unspecified ([place])   . Pneumonia 2014    ?   Marland Kitchen On home oxygen therapy     prn  . Constipation   . Cancer ([place]) 09/14/12    invasive mod diff squamous cell ca  . 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 S/P radiation therapy 12/04/14 srs    lt frontal brain    ALLERGIES:  has No Known Allergies.  MEDICATIONS:  Current Outpatient Prescriptions  Medication Sig Dispense Refill  . amLODipine (NORVASC) 10 MG tablet Take  5 mg by mouth daily. Takes half a tablet    . aspirin EC 81 MG tablet Take 81 mg by mouth daily.     . clonazePAM (KLONOPIN) 0.5 MG tablet Take 0.5 mg by mouth 2 (two) times daily as needed for anxiety. Takes one tablet by mouth two times daily as needed for anxiety.    Marland Kitchen dexamethasone (DECADRON) 4 MG tablet 2 tablets by mouth twice a day the day before, day of and day after the chemotherapy every 3 weeks. 30 tablet 1  . furosemide (LASIX) 20 MG tablet Change to  Lasix 20 mg BID x 7 days; then return to 20 mg Q AM. 45 tablet 0  . guaiFENesin (MUCINEX) 600 MG 12 hr tablet Take 1,200 mg by mouth 2 (two) times daily. Reported on 06/30/2015    . HYDROcodone-acetaminophen (NORCO) 7.5-325 MG tablet Take 1 tablet by mouth every 6 (six) hours as needed for moderate pain. 45 tablet 0  . ipratropium-albuterol (DUONEB) 0.5-2.5 (3) MG/3ML SOLN Take 3 mLs by nebulization 4 (four) times daily. (Patient taking differently: Take 3 mLs by nebulization 4 (four) times daily. Pt takes nebulizer 2 to 3 times daily.) 360 mL 2  . isosorbide mononitrate (IMDUR) 30 MG 24 hr tablet Take 1 tablet (30 mg total) by mouth daily. 30 tablet 4  . lisinopril (PRINIVIL,ZESTRIL) 40 MG tablet Take 40 mg by mouth daily.    . metoprolol tartrate (LOPRESSOR) 25 MG tablet Take 25 mg by mouth 2 (two) times daily.     Marland Kitchen omeprazole (PRILOSEC) 40 MG capsule Take 40 mg by mouth daily.    . pramipexole (MIRAPEX) 1 MG tablet Take 1 mg by mouth at bedtime.    . predniSONE (DELTASONE) 10 MG tablet Take 1 tablet (10 mg total) by mouth daily with breakfast. 50 tablet 1  . prochlorperazine (COMPAZINE) 10 MG tablet Take 10 mg by mouth every 6 (six) hours as needed for nausea or vomiting. Reported on 06/30/2015    . traMADol (ULTRAM) 50 MG tablet Take 1 tablet (50 mg total) by mouth every 12 (twelve) hours as needed. 30 tablet 0  . zolpidem (AMBIEN) 10 MG tablet Take 10 mg by mouth at bedtime as needed for sleep. Reported on 06/30/2015     No current facility-administered medications for this visit.   Facility-Administered Medications Ordered in Other Visits  Medication Dose Route Frequency Provider Last Rate Last Dose  . sodium chloride 0.9 % injection 10 mL  10 mL Intracatheter PRN Curt Bears, MD   10 mL at 01/29/15 0944    SURGICAL HISTORY:  Past Surgical History  Procedure Laterality Date  . Carotid endarterectomy  10/15/2010    left  . Hand surgery      repair of the left long, ring, and small  finger after a saw injury  . Video bronchoscopy  07/24/2012    Procedure: VIDEO BRONCHOSCOPY WITH FLUORO;  Surgeon: Kathee Delton, MD;  Location: [facility];  Service: Cardiopulmonary;  Laterality: Bilateral;  . Lobectomy Right 09/14/2012    Procedure: LOBECTOMY;  Surgeon: Ivin Poot, MD;  Location: [facility];  Service: Thoracic;  Laterality: Right;  RIGHT UPPER LOBECTOMY  . Video assisted thoracoscopy Right 09/14/2012    Procedure: VIDEO ASSISTED THORACOSCOPY;  Surgeon: Ivin Poot, MD;  Location: [facility];  Service: Thoracic;  Laterality: Right;  . Video bronchoscopy with endobronchial ultrasound N/A 08/27/2014    Procedure: VIDEO BRONCHOSCOPY WITH ENDOBRONCHIAL ULTRASOUND;  Surgeon: Ivin Poot, MD;  Location: [facility];  Service: Thoracic;  Laterality: N/A;  . Scalene node biopsy Right 08/27/2014    Procedure: BIOPSY SCALENE NODE;  Surgeon: Ivin Poot, MD;  Location: [facility];  Service: Thoracic;  Laterality: Right;  . Sterotactic radiosurgery Right 09/25/14    right parietal 30Gy/1 fx    REVIEW OF SYSTEMS:  A comprehensive review of systems was negative except for: Constitutional: positive for fatigue Respiratory: positive for dyspnea on exertion   PHYSICAL EXAMINATION: General appearance: alert, cooperative and no distress Head: Normocephalic, without obvious abnormality, atraumatic Neck: no adenopathy, no JVD, supple, symmetrical, trachea midline and thyroid not enlarged, symmetric, no tenderness/mass/nodules Lymph nodes: Cervical, supraclavicular, and axillary nodes normal. Resp: wheezes bilaterally Back: symmetric, no curvature. ROM normal. No CVA tenderness. Cardio: regular rate and rhythm, S1, S2 normal, no murmur, click, rub or gallop GI: soft, non-tender; bowel sounds normal; no masses,  no organomegaly Extremities: extremities normal, atraumatic, no cyanosis or edema Neurologic: Alert and oriented X 3, normal strength and tone. Normal symmetric reflexes. Normal coordination  and gait  ECOG PERFORMANCE STATUS: 1 - Symptomatic but completely ambulatory  Blood pressure 179/69, pulse 91, temperature 97.4 F (36.3 C), temperature source Oral, resp. rate 19, height '5\' 9"'$  (1.753 m), weight 176 lb (79.833 kg), SpO2 98 %.  LABORATORY DATA: Lab Results  Component Value Date   WBC 5.6 09/03/2015   HGB 10.6* 09/03/2015   HCT 33.8* 09/03/2015   MCV 100.9* 09/03/2015   PLT 209 09/03/2015      Chemistry      Component Value Date/Time   NA 138 09/03/2015 1038   NA 138 12/05/2014 0618   K 3.8 09/03/2015 1038   K 3.4* 12/05/2014 0618   CL 95* 12/05/2014 0618   CL 101 12/28/2012 0852   CO2 29 09/03/2015 1038   CO2 32 12/05/2014 0618   BUN 18.5 09/03/2015 1038   BUN 9 12/05/2014 0618   CREATININE 1.0 09/03/2015 1038   CREATININE 0.52* 12/05/2014 0618      Component Value Date/Time   CALCIUM 9.1 09/03/2015 1038   CALCIUM 9.2 12/05/2014 0618   ALKPHOS 67 09/03/2015 1038   ALKPHOS 71 12/05/2014 0618   AST 14 09/03/2015 1038   AST 24 12/05/2014 0618   ALT 12 09/03/2015 1038   ALT 30 12/05/2014 0618   BILITOT <0.30 09/03/2015 1038   BILITOT 0.2* 12/05/2014 0618       RADIOGRAPHIC STUDIES: No results found.  ASSESSMENT AND PLAN: This is a very pleasant 75 years old white male with history of stage IIA non-small cell lung cancer status post right upper lobectomy followed by 4 cycles of adjuvant chemotherapy.  The patient underwent systemic chemotherapy with carboplatin and gemcitabine status post 4 cycles discontinued secondary to intolerance and mild disease progression.  The patient completed treatment with Nivolumab status post 4 cycles but this was discontinued secondary to disease progression. He also recently completed stereotactic radiotherapy to metastatic brain lesions. The patient is currently undergoing systemic chemotherapy with docetaxel and Cyramza status post 6 cycles and has been tolerating his treatment well except for the aching pain after  Neulasta injection. I recommended for him to continue his current treatment with docetaxel and Cyramza with cycle #7 today as a scheduled. He would come back for follow-up visit in 3 weeks for reevaluation with the start of cycle #8. I would consider the patient for repeat CT scan of the chest, abdomen and pelvis after cycle #8 for restaging of his disease. He  was advised to call immediately if he has any concerning symptoms in the interval.  The patient voices understanding of current disease status and treatment options and is in agreement with the current care plan.  All questions were answered. The patient knows to call the clinic with any problems, questions or concerns. We can certainly see the patient much sooner if necessary.  Disclaimer: This note was dictated with voice recognition software. Similar sounding words can inadvertently be transcribed and may not be corrected upon review.

## 2015-09-03 NOTE — Patient Instructions (Signed)
[facility] Discharge Instructions for Patients Receiving Chemotherapy  Today you received the following chemotherapy agents:  Taxotere, Cyramza  To help prevent nausea and vomiting after your treatment, we encourage you to take your nausea medication as prescribed.   If you develop nausea and vomiting that is not controlled by your nausea medication, call the clinic.   BELOW ARE SYMPTOMS THAT SHOULD BE REPORTED IMMEDIATELY:  *FEVER GREATER THAN 100.5 F  *CHILLS WITH OR WITHOUT FEVER  NAUSEA AND VOMITING THAT IS NOT CONTROLLED WITH YOUR NAUSEA MEDICATION  *UNUSUAL SHORTNESS OF BREATH  *UNUSUAL BRUISING OR BLEEDING  TENDERNESS IN MOUTH AND THROAT WITH OR WITHOUT PRESENCE OF ULCERS  *URINARY PROBLEMS  *BOWEL PROBLEMS  UNUSUAL RASH Items with * indicate a potential emergency and should be followed up as soon as possible.  Feel free to call the clinic you have any questions or concerns. The clinic phone number is [phone].  Please show the [facility] at check-in to the Emergency Department and triage nurse.

## 2015-09-04 ENCOUNTER — Ambulatory Visit: Payer: Medicare Other

## 2015-09-05 ENCOUNTER — Ambulatory Visit (HOSPITAL_BASED_OUTPATIENT_CLINIC_OR_DEPARTMENT_OTHER): Payer: Medicare Other

## 2015-09-05 VITALS — BP 168/63 | HR 92 | Temp 98.3°F

## 2015-09-05 DIAGNOSIS — C3491 Malignant neoplasm of unspecified part of right bronchus or lung: Secondary | ICD-10-CM

## 2015-09-05 DIAGNOSIS — Z5189 Encounter for other specified aftercare: Secondary | ICD-10-CM | POA: Diagnosis not present

## 2015-09-05 DIAGNOSIS — C3411 Malignant neoplasm of upper lobe, right bronchus or lung: Secondary | ICD-10-CM

## 2015-09-05 MED ORDER — PEGFILGRASTIM INJECTION 6 MG/0.6ML ~~LOC~~
6.0000 mg | PREFILLED_SYRINGE | Freq: Once | SUBCUTANEOUS | Status: AC
  Administered 2015-09-05: 6 mg via SUBCUTANEOUS
  Filled 2015-09-05: qty 0.6

## 2015-09-10 ENCOUNTER — Encounter: Payer: Self-pay | Admitting: Nurse Practitioner

## 2015-09-10 ENCOUNTER — Telehealth: Payer: Self-pay | Admitting: *Deleted

## 2015-09-10 ENCOUNTER — Other Ambulatory Visit (HOSPITAL_BASED_OUTPATIENT_CLINIC_OR_DEPARTMENT_OTHER): Payer: Medicare Other

## 2015-09-10 ENCOUNTER — Encounter: Payer: Medicare Other | Admitting: Nurse Practitioner

## 2015-09-10 ENCOUNTER — Telehealth: Payer: Self-pay | Admitting: Nurse Practitioner

## 2015-09-10 ENCOUNTER — Ambulatory Visit (HOSPITAL_BASED_OUTPATIENT_CLINIC_OR_DEPARTMENT_OTHER): Payer: Medicare Other | Admitting: Nurse Practitioner

## 2015-09-10 VITALS — BP 182/83 | HR 106 | Temp 97.7°F | Resp 18 | Ht 69.0 in | Wt 175.7 lb

## 2015-09-10 DIAGNOSIS — R634 Abnormal weight loss: Secondary | ICD-10-CM

## 2015-09-10 DIAGNOSIS — C3400 Malignant neoplasm of unspecified main bronchus: Secondary | ICD-10-CM

## 2015-09-10 DIAGNOSIS — R21 Rash and other nonspecific skin eruption: Secondary | ICD-10-CM

## 2015-09-10 DIAGNOSIS — C3490 Malignant neoplasm of unspecified part of unspecified bronchus or lung: Secondary | ICD-10-CM

## 2015-09-10 DIAGNOSIS — C3491 Malignant neoplasm of unspecified part of right bronchus or lung: Secondary | ICD-10-CM

## 2015-09-10 DIAGNOSIS — I10 Essential (primary) hypertension: Secondary | ICD-10-CM

## 2015-09-10 LAB — COMPREHENSIVE METABOLIC PANEL
ALBUMIN: 3.2 g/dL — AB (ref 3.5–5.0)
ALK PHOS: 82 U/L (ref 40–150)
ALT: 10 U/L (ref 0–55)
ANION GAP: 8 meq/L (ref 3–11)
AST: 14 U/L (ref 5–34)
BILIRUBIN TOTAL: 0.41 mg/dL (ref 0.20–1.20)
BUN: 13.8 mg/dL (ref 7.0–26.0)
CALCIUM: 8.7 mg/dL (ref 8.4–10.4)
CO2: 30 mEq/L — ABNORMAL HIGH (ref 22–29)
CREATININE: 0.9 mg/dL (ref 0.7–1.3)
Chloride: 101 mEq/L (ref 98–109)
EGFR: 84 mL/min/{1.73_m2} — ABNORMAL LOW (ref >90)
Glucose: 82 mg/dl (ref 70–140)
Potassium: 4.2 mEq/L (ref 3.5–5.1)
Sodium: 139 mEq/L (ref 136–145)
TOTAL PROTEIN: 6.2 g/dL — AB (ref 6.4–8.3)

## 2015-09-10 LAB — CBC WITH DIFFERENTIAL/PLATELET
BASO%: 0.4 % (ref 0.0–2.0)
Basophils Absolute: 0 10*3/uL (ref 0.0–0.1)
EOS%: 0.2 % (ref 0.0–7.0)
Eosinophils Absolute: 0 10*3/uL (ref 0.0–0.5)
HEMATOCRIT: 32 % — AB (ref 38.4–49.9)
HEMOGLOBIN: 10.1 g/dL — AB (ref 13.0–17.1)
LYMPH#: 1.4 10*3/uL (ref 0.9–3.3)
LYMPH%: 26 % (ref 14.0–49.0)
MCH: 31.6 pg (ref 27.2–33.4)
MCHC: 31.6 g/dL — AB (ref 32.0–36.0)
MCV: 100 fL — ABNORMAL HIGH (ref 79.3–98.0)
MONO#: 1.1 10*3/uL — AB (ref 0.1–0.9)
MONO%: 20.7 % — ABNORMAL HIGH (ref 0.0–14.0)
NEUT%: 52.7 % (ref 39.0–75.0)
NEUTROS ABS: 2.9 10*3/uL (ref 1.5–6.5)
PLATELETS: 118 10*3/uL — AB (ref 140–400)
RBC: 3.2 10*6/uL — ABNORMAL LOW (ref 4.20–5.82)
RDW: 15.7 % — AB (ref 11.0–14.6)
WBC: 5.5 10*3/uL (ref 4.0–10.3)

## 2015-09-10 NOTE — Assessment & Plan Note (Signed)
Patient states that he has lost greater than 10 pounds within this past month or so unintentionally.  He states he's had minimal appetite and decreased taste sensation as well.  Patient's was advised to eat multiple small meals throughout the day; and to push fluids.

## 2015-09-10 NOTE — Assessment & Plan Note (Signed)
Patient has a history of hypertension; and takes 4 different blood pressure medications.  He states that he typically takes 2 blood pressure medications in the morning and 2 different blood pressure medications at night.  Patient states that he has had multiple episodes recently of decreased blood pressure with a systolic blood pressure in the 90s.  He also is felt increasingly weak and is sometimes dizzy when he changes positions.  Blood pressure today was 182/83; the patient states that he did not take any of his blood pressure medications prior to arrival to the [facility] today.  Patient and his wife are both questioning if he should continue to take the Lasix on a daily basis as previously directed.  Patient states that his shortness of breath has improved; he has no further extremity edema.  Patient was advised to keep a record/loss of his blood pressure at home for the next week.  He was advised to take the Lasix only on a when necessary basis this week as well; to see if this makes any difference.  Patient will return next week for labs; and will return with his blood pressure log for [place] to review.  Advised both patient and his wife that patient may very well need to consider adjusting some of his blood pressure medications; causes.  Sounds like he may be allowing his blood pressure dropped too low.  Confirmed with both patient and wife that patient should avoid changing any of his blood pressure medications unless he does review all with Dr. Lenna Gilford pulmonlogist first.

## 2015-09-10 NOTE — Progress Notes (Signed)
SYMPTOM MANAGEMENT CLINIC   HPI: [name] 75 y.o. male diagnosed with lung cancer; with brain metastasis.  Currently undergoing Taxotere/cyramza chemotherapy therapy regimen.  Patient is status post SRS brain radiation on 07/18/2015. Patient has chronic thin skin with multiple cracks in the skin.  Persistently.  Patient states that he continues to take prednisone on a daily basis.  Patient has noted some increased peeling of the skin to his bilateral hands; with increased cracking of his scan and a mass to the left upper arm as well.  Patient also reports unintentional weight loss within the past few weeks as well.  Patient denies any other new new symptoms whatsoever.  He denies any recent fevers or chills..    Rash    Review of Systems  Respiratory:       Patient on O2 via nasal cannula chronically.  Cardiovascular: Negative for chest pain.  Skin: Positive for rash.  Neurological: Positive for weakness.  All other systems reviewed and are negative.   Past Medical History  Diagnosis Date  . Arthritis   . Wheezing   . Sore throat   . Carotid artery occlusion   . COPD (chronic obstructive pulmonary disease) ([place])   . Lumbar disc disease   . Restless leg syndrome   . Allergic rhinitis   . Hypertension   . Anxiety     anxiety  . Shortness of breath   . Lung mass   . GERD (gastroesophageal reflux disease)   . H/O hiatal hernia   . Pre-operative cardiovascular examination   . Nonspecific abnormal unspecified cardiovascular function study   . Peripheral vascular disease, unspecified ([place])   . Pneumonia 2014    ?   Marland Kitchen On home oxygen therapy     prn  . Constipation   . Cancer ([place]) 09/14/12    invasive mod diff squamous cell ca  . 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 S/P radiation therapy 12/04/14 srs    lt frontal brain    Past Surgical History  Procedure Laterality Date  . Carotid endarterectomy  10/15/2010    left  . Hand surgery     repair of the left long, ring, and small finger after a saw injury  . Video bronchoscopy  07/24/2012    Procedure: VIDEO BRONCHOSCOPY WITH FLUORO;  Surgeon: Kathee Delton, MD;  Location: [facility];  Service: Cardiopulmonary;  Laterality: Bilateral;  . Lobectomy Right 09/14/2012    Procedure: LOBECTOMY;  Surgeon: Ivin Poot, MD;  Location: [facility];  Service: Thoracic;  Laterality: Right;  RIGHT UPPER LOBECTOMY  . Video assisted thoracoscopy Right 09/14/2012    Procedure: VIDEO ASSISTED THORACOSCOPY;  Surgeon: Ivin Poot, MD;  Location: [facility];  Service: Thoracic;  Laterality: Right;  . Video bronchoscopy with endobronchial ultrasound N/A 08/27/2014    Procedure: VIDEO BRONCHOSCOPY WITH ENDOBRONCHIAL ULTRASOUND;  Surgeon: Ivin Poot, MD;  Location: [facility] OR;  Service: Thoracic;  Laterality: N/A;  . Scalene node biopsy Right 08/27/2014    Procedure: BIOPSY SCALENE NODE;  Surgeon: Ivin Poot, MD;  Location: [facility] OR;  Service: Thoracic;  Laterality: Right;  . Sterotactic radiosurgery Right 09/25/14    right parietal 30Gy/1 fx    has Occlusion and stenosis of carotid artery without mention of cerebral infarction; COPD (chronic obstructive pulmonary disease) ([place]); Peripheral vascular disease, unspecified ([place]); Lung cancer ([place]); Coronary atherosclerosis of native coronary artery; Essential hypertension, benign; Tobacco use disorder; Carotid stenosis; Aftercare  following surgery of the circulatory system; Weakness-Hand and  Legs; COPD exacerbation ([place]); Tachycardia; Dyspnea; Brain metastasis ([place]); Chemotherapy induced neutropenia ([place]); Renal stone; Pyelonephritis; Dehydration; UTI (lower urinary tract infection); Left arm cellulitis; Blood poisoning ([place]); Sepsis ([place]); Encounter for antineoplastic immunotherapy; Cancer associated pain; Rash; Syncope; Constipation; Insomnia; Hypokalemia; Extravasation, infusion or chemotherapeutic agent; Epistaxis; Metastatic lung cancer (metastasis from lung to  other site) [facility]); Poor venous access; 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    has No Known Allergies.    Medication List       This list is accurate as of: 09/10/15  3:04 PM.  Always use your most recent med list.               amLODipine 10 MG tablet  Commonly known as:  NORVASC  Take 5 mg by mouth daily. Takes half a tablet     aspirin EC 81 MG tablet  Take 81 mg by mouth daily.     clonazePAM 0.5 MG tablet  Commonly known as:  KLONOPIN  Take 0.5 mg by mouth 2 (two) times daily as needed for anxiety. Takes one tablet by mouth two times daily as needed for anxiety.     dexamethasone 4 MG tablet  Commonly known as:  DECADRON  2 tablets by mouth twice a day the day before, day of and day after the chemotherapy every 3 weeks.     furosemide 20 MG tablet  Commonly known as:  LASIX  Change to Lasix 20 mg BID x 7 days; then return to 20 mg Q AM.     guaiFENesin 600 MG 12 hr tablet  Commonly known as:  MUCINEX  Take 1,200 mg by mouth 2 (two) times daily. Reported on 06/30/2015     HYDROcodone-acetaminophen 7.5-325 MG tablet  Commonly known as:  NORCO  Take 1 tablet by mouth every 6 (six) hours as needed for moderate pain.     ipratropium-albuterol 0.5-2.5 (3) MG/3ML Soln  Commonly known as:  DUONEB  Take 3 mLs by nebulization 4 (four) times daily.     isosorbide mononitrate 30 MG 24 hr tablet  Commonly known as:  IMDUR  Take 1 tablet (30 mg total) by mouth daily.     lisinopril 40 MG tablet  Commonly known as:  PRINIVIL,ZESTRIL  Take 40 mg by mouth daily.     metoprolol tartrate 25 MG tablet  Commonly known as:  LOPRESSOR  Take 25 mg by mouth 2 (two) times daily.     omeprazole 40 MG capsule  Commonly known as:  PRILOSEC  Take 40 mg by mouth daily.     pramipexole 1 MG tablet  Commonly known as:  MIRAPEX  Take 1 mg by mouth at bedtime.      predniSONE 10 MG tablet  Commonly known as:  DELTASONE  Take 1 tablet (10 mg total) by mouth daily with breakfast.     prochlorperazine 10 MG tablet  Commonly known as:  COMPAZINE  Take 10 mg by mouth every 6 (six) hours as needed for nausea or vomiting. Reported on 09/10/2015     traMADol 50 MG tablet  Commonly known as:  ULTRAM  Take 1 tablet (50 mg total) by mouth every 12 (twelve) hours as needed.     zolpidem 10 MG tablet  Commonly known as:  AMBIEN  Take 10 mg by mouth at bedtime as needed for sleep. Reported  on 06/30/2015         PHYSICAL EXAMINATION  Oncology Vitals 09/10/2015 09/05/2015  Height 175 cm -  Weight 79.697 kg -  Weight (lbs) 175 lbs 11 oz -  BMI (kg/m2) 25.95 kg/m2 -  Temp 97.7 98.3  Pulse 106 92  Resp 18 -  SpO2 98 -  BSA (m2) 1.97 m2 -   BP Readings from Last 2 Encounters:  09/10/15 182/83  09/05/15 168/63    Physical Exam  Constitutional: He is oriented to person, place, and time. He appears unhealthy.  HENT:  Head: Normocephalic and atraumatic.  Patient has some oral lesions to his bilateral lower gums.  No obvious thrush noted.  Eyes: Conjunctivae and EOM are normal. Pupils are equal, round, and reactive to light. Right eye exhibits no discharge. Left eye exhibits no discharge. No scleral icterus.  Neck: Normal range of motion.  Cardiovascular: Normal rate, regular rhythm, normal heart sounds and intact distal pulses.   Pulmonary/Chest: Effort normal. No respiratory distress.  Patient on O2 via nasal cannula.  Patient has chronic shortness of breath and wheezing.  Musculoskeletal: Normal range of motion. He exhibits no edema or tenderness.  +2-3 edema to bilateral lower extremity is.  No calf tenderness.  Neurological: He is alert and oriented to person, place, and time. Gait normal.  Skin: Skin is warm and dry. Rash noted. There is erythema. There is pallor.  Exam today reveals very thin skin to bilateral upper extremities.  Patient has  multiple healed lesions to bilateral arms as well.  Patient has what appears to be a healing hematoma or ruptured blood vessel to the left upper arm directly above the elbow.  Patient has peeling of his bilateral hands as well; with pink/red new skin underneath.  There is no evidence of infection.  Also, is noted.  The patient does have some thickened/discolored fingernails as well.    Psychiatric: Affect normal.  Nursing note and vitals reviewed.   LABORATORY DATA:. Appointment on 09/10/2015  Component Date Value Ref Range Status  . WBC 09/10/2015 5.5  4.0 - 10.3 10e3/uL Final  . NEUT# 09/10/2015 2.9  1.5 - 6.5 10e3/uL Final  . HGB 09/10/2015 10.1* 13.0 - 17.1 g/dL Final  . HCT 09/10/2015 32.0* 38.4 - 49.9 % Final  . Platelets 09/10/2015 118* 140 - 400 10e3/uL Final  . MCV 09/10/2015 100.0* 79.3 - 98.0 fL Final  . MCH 09/10/2015 31.6  27.2 - 33.4 pg Final  . MCHC 09/10/2015 31.6* 32.0 - 36.0 g/dL Final  . RBC 09/10/2015 3.20* 4.20 - 5.82 10e6/uL Final  . RDW 09/10/2015 15.7* 11.0 - 14.6 % Final  . lymph# 09/10/2015 1.4  0.9 - 3.3 10e3/uL Final  . MONO# 09/10/2015 1.1* 0.1 - 0.9 10e3/uL Final  . Eosinophils Absolute 09/10/2015 0.0  0.0 - 0.5 10e3/uL Final  . Basophils Absolute 09/10/2015 0.0  0.0 - 0.1 10e3/uL Final  . NEUT% 09/10/2015 52.7  39.0 - 75.0 % Final  . LYMPH% 09/10/2015 26.0  14.0 - 49.0 % Final  . MONO% 09/10/2015 20.7* 0.0 - 14.0 % Final  . EOS% 09/10/2015 0.2  0.0 - 7.0 % Final  . BASO% 09/10/2015 0.4  0.0 - 2.0 % Final  . Sodium 09/10/2015 139  136 - 145 mEq/L Final  . Potassium 09/10/2015 4.2  3.5 - 5.1 mEq/L Final  . Chloride 09/10/2015 101  98 - 109 mEq/L Final  . CO2 09/10/2015 30* 22 - 29 mEq/L Final  . Glucose 09/10/2015 82  70 - 140 mg/dl Final   Glucose reference range is for nonfasting patients. Fasting glucose reference range is 70- 100.  Marland Kitchen BUN 09/10/2015 13.8  7.0 - 26.0 mg/dL Final  . Creatinine 09/10/2015 0.9  0.7 - 1.3 mg/dL Final  . Total Bilirubin  09/10/2015 0.41  0.20 - 1.20 mg/dL Final  . Alkaline Phosphatase 09/10/2015 82  40 - 150 U/L Final  . AST 09/10/2015 14  5 - 34 U/L Final  . ALT 09/10/2015 10  0 - 55 U/L Final  . Total Protein 09/10/2015 6.2* 6.4 - 8.3 g/dL Final  . Albumin 09/10/2015 3.2* 3.5 - 5.0 g/dL Final  . Calcium 09/10/2015 8.7  8.4 - 10.4 mg/dL Final  . Anion Gap 09/10/2015 8  3 - 11 mEq/L Final  . EGFR 09/10/2015 84* >90 ml/min/1.73 m2 Final   eGFR is calculated using the CKD-EPI Creatinine Equation (2009)     RADIOGRAPHIC STUDIES: No results found.  ASSESSMENT/PLAN:    Unintentional weight loss Patient states that he has lost greater than 10 pounds within this past month or so unintentionally.  He states he's had minimal appetite and decreased taste sensation as well.  Patient's was advised to eat multiple small meals throughout the day; and to push fluids.  Rash Patient has chronic thin skin with multiple cracks in the skin.  Persistently.  Patient states that he continues to take prednisone on a daily basis.  Patient has noted some increased peeling of the skin to his bilateral hands; with increased cracking of his scan and a mass to the left upper arm as well.  Exam today reveals very thin skin to bilateral upper extremities.  Patient has multiple healed lesions to bilateral arms as well.  Patient has what appears to be a healing hematoma or ruptured blood vessel to the left upper arm directly above the elbow.  Patient has peeling of his bilateral hands as well; with pink/red new skin underneath.  There is no evidence of infection.  Also, is noted.  The patient does have some thickened/discolored fingernails as well.  Advised patient that the thin skin is most likely secondary to his chronic prednisone use.  Also, the skin changes and nail changes could be associated with his chemotherapy as well.  Patient was advised to keep his skin well moisturized and to watch for any sources of infection.  Patient  was given samples of Aquaphor to use to his skin.  Lung cancer Patient received cycle 7 of his Taxotere/cyramza chemotherapy regimen on every 22nd 2017.Marland Kitchen  He received a Neulasta injection for growth factor support.  The day following his last chemotherapy.  Patient received SRS brain irradiation on 07/18/2015.  Blood counts obtained today were essentially within normal limits. No signs revealed hypertension; the patient states that he is not taking any of his blood pressure medications today.  Also, patient and his wife are asking if he should taper or discontinue the prednisone 10 mg that he takes on a daily basis.  Advised both patient and his wife that they should consult with Dr. Lenna Gilford pulmonologist before he makes any changes in his prednisone; since Dr. Lenna Gilford prescribed the prednisone.  Patient's wife stated that she would like to try decreasing the prednisone down to 5 mg on a daily basis and see if this makes any difference in how the patient is feeling.  Once again advised the patient and his wife to consult with her pulmonologist prior to making any further medication changes.  Patient  is scheduled to return for labs only on 09/17/2015.  He is scheduled for labs, visit, and chemotherapy 09/24/2015.  Hypertension Patient has a history of hypertension; and takes 4 different blood pressure medications.  He states that he typically takes 2 blood pressure medications in the morning and 2 different blood pressure medications at night.  Patient states that he has had multiple episodes recently of decreased blood pressure with a systolic blood pressure in the 90s.  He also is felt increasingly weak and is sometimes dizzy when he changes positions.  Blood pressure today was 182/83; the patient states that he did not take any of his blood pressure medications prior to arrival to the [facility] today.  Patient and his wife are both questioning if he should continue to take the Lasix on a daily  basis as previously directed.  Patient states that his shortness of breath has improved; he has no further extremity edema.  Patient was advised to keep a record/loss of his blood pressure at home for the next week.  He was advised to take the Lasix only on a when necessary basis this week as well; to see if this makes any difference.  Patient will return next week for labs; and will return with his blood pressure log for [place] to review.  Advised both patient and his wife that patient may very well need to consider adjusting some of his blood pressure medications; causes.  Sounds like he may be allowing his blood pressure dropped too low.  Confirmed with both patient and wife that patient should avoid changing any of his blood pressure medications unless he does review all with Dr. Lenna Gilford pulmonlogist first.     Patient stated understanding of all instructions; and was in agreement with this plan of care. The patient knows to call the clinic with any problems, questions or concerns.   This was a shared visit with [name] today.  Total time spent with patient was 25 minutes;  with greater than 75 percent of that time spent in face to face counseling regarding patient's symptoms,  and coordination of care and follow up.  Disclaimer:This dictation was prepared with Dragon/digital dictation along with [employer]. Any transcriptional errors that result from this process are unintentional.  Drue Second, NP 09/10/2015

## 2015-09-10 NOTE — Telephone Encounter (Signed)
per pof to sch pt appt-per [name] pt to follow as sch

## 2015-09-10 NOTE — Assessment & Plan Note (Signed)
Patient has chronic thin skin with multiple cracks in the skin.  Persistently.  Patient states that he continues to take prednisone on a daily basis.  Patient has noted some increased peeling of the skin to his bilateral hands; with increased cracking of his scan and a mass to the left upper arm as well.  Exam today reveals very thin skin to bilateral upper extremities.  Patient has multiple healed lesions to bilateral arms as well.  Patient has what appears to be a healing hematoma or ruptured blood vessel to the left upper arm directly above the elbow.  Patient has peeling of his bilateral hands as well; with pink/red new skin underneath.  There is no evidence of infection.  Also, is noted.  The patient does have some thickened/discolored fingernails as well.  Advised patient that the thin skin is most likely secondary to his chronic prednisone use.  Also, the skin changes and nail changes could be associated with his chemotherapy as well.  Patient was advised to keep his skin well moisturized and to watch for any sources of infection.  Patient was given samples of Aquaphor to use to his skin.

## 2015-09-10 NOTE — Telephone Encounter (Signed)
appt made per 3/1 pof. Pt aware

## 2015-09-10 NOTE — Assessment & Plan Note (Addendum)
Patient received cycle 7 of his Taxotere/cyramza chemotherapy regimen on every 22nd 2017.Marland Kitchen  He received a Neulasta injection for growth factor support.  The day following his last chemotherapy.  Patient received SRS brain irradiation on 07/18/2015.  Blood counts obtained today were essentially within normal limits. No signs revealed hypertension; the patient states that he is not taking any of his blood pressure medications today.  Also, patient and his wife are asking if he should taper or discontinue the prednisone 10 mg that he takes on a daily basis.  Advised both patient and his wife that they should consult with Dr. Lenna Gilford pulmonologist before he makes any changes in his prednisone; since Dr. Lenna Gilford prescribed the prednisone.  Patient's wife stated that she would like to try decreasing the prednisone down to 5 mg on a daily basis and see if this makes any difference in how the patient is feeling.  Once again advised the patient and his wife to consult with her pulmonologist prior to making any further medication changes.  Patient is scheduled to return for labs only on 09/17/2015.  He is scheduled for labs, visit, and chemotherapy 09/24/2015.

## 2015-09-11 NOTE — Telephone Encounter (Signed)
Pt filled out a walk in form to be seen in [facility]. POF sent to add appt.

## 2015-09-15 ENCOUNTER — Telehealth: Payer: Self-pay

## 2015-09-15 NOTE — Telephone Encounter (Signed)
Patient's wife called and wanted [name] to know that [name] would be calling his office because patient is up for a re-certification to keep his oxygen and that patient wouldn't be seeing [name] for awhile.  Writer reminded them that they have a lab appt on Wednesday 09/17/15 and a lab/ MD visit on 09/24/15.  Patient's wife and patient stated understanding.

## 2015-09-17 ENCOUNTER — Telehealth: Payer: Self-pay | Admitting: Internal Medicine

## 2015-09-17 ENCOUNTER — Telehealth: Payer: Self-pay | Admitting: *Deleted

## 2015-09-17 ENCOUNTER — Other Ambulatory Visit (HOSPITAL_BASED_OUTPATIENT_CLINIC_OR_DEPARTMENT_OTHER): Payer: Medicare Other

## 2015-09-17 DIAGNOSIS — C3491 Malignant neoplasm of unspecified part of right bronchus or lung: Secondary | ICD-10-CM

## 2015-09-17 DIAGNOSIS — C3411 Malignant neoplasm of upper lobe, right bronchus or lung: Secondary | ICD-10-CM

## 2015-09-17 LAB — COMPREHENSIVE METABOLIC PANEL
ANION GAP: 7 meq/L (ref 3–11)
AST: 13 U/L (ref 5–34)
Albumin: 2.9 g/dL — ABNORMAL LOW (ref 3.5–5.0)
Alkaline Phosphatase: 114 U/L (ref 40–150)
BILIRUBIN TOTAL: 0.31 mg/dL (ref 0.20–1.20)
BUN: 7.9 mg/dL (ref 7.0–26.0)
CHLORIDE: 101 meq/L (ref 98–109)
CO2: 29 meq/L (ref 22–29)
CREATININE: 0.9 mg/dL (ref 0.7–1.3)
Calcium: 8.6 mg/dL (ref 8.4–10.4)
EGFR: 86 mL/min/{1.73_m2} — ABNORMAL LOW (ref >90)
GLUCOSE: 99 mg/dL (ref 70–140)
Potassium: 4.2 mEq/L (ref 3.5–5.1)
Sodium: 138 mEq/L (ref 136–145)
TOTAL PROTEIN: 6.1 g/dL — AB (ref 6.4–8.3)

## 2015-09-17 LAB — CBC WITH DIFFERENTIAL/PLATELET
BASO%: 0.2 % (ref 0.0–2.0)
Basophils Absolute: 0 10*3/uL (ref 0.0–0.1)
EOS ABS: 0 10*3/uL (ref 0.0–0.5)
EOS%: 0 % (ref 0.0–7.0)
HCT: 32.4 % — ABNORMAL LOW (ref 38.4–49.9)
HGB: 10.2 g/dL — ABNORMAL LOW (ref 13.0–17.1)
LYMPH%: 12.8 % — AB (ref 14.0–49.0)
MCH: 31.8 pg (ref 27.2–33.4)
MCHC: 31.5 g/dL — AB (ref 32.0–36.0)
MCV: 100.9 fL — ABNORMAL HIGH (ref 79.3–98.0)
MONO#: 1.4 10*3/uL — AB (ref 0.1–0.9)
MONO%: 10.3 % (ref 0.0–14.0)
NEUT%: 76.7 % — AB (ref 39.0–75.0)
NEUTROS ABS: 10.1 10*3/uL — AB (ref 1.5–6.5)
PLATELETS: 136 10*3/uL — AB (ref 140–400)
RBC: 3.21 10*6/uL — AB (ref 4.20–5.82)
RDW: 16.4 % — ABNORMAL HIGH (ref 11.0–14.6)
WBC: 13.1 10*3/uL — AB (ref 4.0–10.3)
lymph#: 1.7 10*3/uL (ref 0.9–3.3)

## 2015-09-17 NOTE — Telephone Encounter (Signed)
TC to pt to follow up from [facility] 09/10/15- Pt reports he has taken his blood pressure daily but not kept a log. Pt states he had a bad headache this morning but that has resolved. Prior to taking his morning medications pt reports he took his BP- unable to recall actual reading- states it was "200 over 100 or something" Pt states he is not feeling well overall.   Pt took bp at time of call 178/93. He is going to take a lasix today.  Pt advised to continue to take bp medication as prescribed and follow up with cardiologist. Pt wife will call the cardiologist now to schedule an appointment. He will start keeping the log in the mean time.

## 2015-09-17 NOTE — Telephone Encounter (Signed)
Pt needed morning appt...done.Marland KitchenMarland KitchenMarland Kitchen

## 2015-09-24 ENCOUNTER — Other Ambulatory Visit: Payer: Medicare Other

## 2015-09-24 ENCOUNTER — Ambulatory Visit: Payer: Medicare Other

## 2015-09-24 ENCOUNTER — Encounter: Payer: Self-pay | Admitting: Interventional Cardiology

## 2015-09-24 ENCOUNTER — Ambulatory Visit (INDEPENDENT_AMBULATORY_CARE_PROVIDER_SITE_OTHER): Payer: Medicare Other | Admitting: Interventional Cardiology

## 2015-09-24 ENCOUNTER — Ambulatory Visit: Payer: Medicare Other | Admitting: Internal Medicine

## 2015-09-24 VITALS — BP 110/70 | HR 95 | Ht 69.0 in | Wt 178.2 lb

## 2015-09-24 DIAGNOSIS — I739 Peripheral vascular disease, unspecified: Secondary | ICD-10-CM

## 2015-09-24 DIAGNOSIS — I2510 Atherosclerotic heart disease of native coronary artery without angina pectoris: Secondary | ICD-10-CM

## 2015-09-24 DIAGNOSIS — E8770 Fluid overload, unspecified: Secondary | ICD-10-CM

## 2015-09-24 NOTE — Patient Instructions (Addendum)
**Note De-Identified [name] Obfuscation** Medication Instructions:  You may take an additional 5 mg of Amlodipine daily as needed for elevated blood pressure. All of your other medications remain the same.  Labwork: None  Testing/Procedures: None  Follow-Up: Your physician wants you to follow-up in: 1 year. You will receive a reminder letter in the mail two months in advance. If you don't receive a letter, please call our office to schedule the follow-up appointment.   Any Other Special Instructions Will Be Listed Below (If Applicable). After your Cemo is done please see Dr Scot Dock in follow up.     If you need a refill on your cardiac medications before your next appointment, please call your pharmacy.

## 2015-09-24 NOTE — Progress Notes (Signed)
Patient ID: [name], male   DOB: [birth year], 75 y.o.   MRN: [id number]     Cardiology Office Note   Date:  09/24/2015   ID:  [name], DOB 07/31/1940, MRN [id number]  PCP:  Beatris Si    No chief complaint on file. f/u tachycardia   Wt Readings from Last 3 Encounters:  09/24/15 178 lb 3.2 oz (80.831 kg)  09/10/15 175 lb 11.2 oz (79.697 kg)  09/03/15 176 lb (79.833 kg)       History of Present Illness: [name] is a 75 y.o. male  with peripheral vascular disease who has lung cancer- treated with surgery and now with chemotherapy. At that time of his surgery, he had a stress test with an inferior wall defect. 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 He was managed medically. He has not had any significant angina.  [name] functional capacity continues to be significantly decreased. He feels fatigued, particularly after chemo.  He has not had any more palpitations. His heart rate has been better controlled.    Past Medical History  Diagnosis Date  . Arthritis   . Wheezing   . Sore throat   . Carotid artery occlusion   . COPD (chronic obstructive pulmonary disease) ([place])   . Lumbar disc disease   . Restless leg syndrome   . Allergic rhinitis   . Hypertension   . Anxiety     anxiety  . Shortness of breath   . Lung mass   . GERD (gastroesophageal reflux disease)   . H/O hiatal hernia   . Pre-operative cardiovascular examination   . Nonspecific abnormal unspecified cardiovascular function study   . Peripheral vascular disease, unspecified ([place])   . Pneumonia 2014    ?   Marland Kitchen On home oxygen therapy     prn  . Constipation   . Cancer ([place]) 09/14/12    invasive mod diff squamous cell ca  . 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 S/P radiation therapy 12/04/14 srs    lt frontal brain    Past Surgical History  Procedure Laterality Date  . Carotid endarterectomy  10/15/2010    left  . Hand surgery      repair of the left long, ring,  and small finger after a saw injury  . Video bronchoscopy  07/24/2012    Procedure: VIDEO BRONCHOSCOPY WITH FLUORO;  Surgeon: Kathee Delton, MD;  Location: [facility];  Service: Cardiopulmonary;  Laterality: Bilateral;  . Lobectomy Right 09/14/2012    Procedure: LOBECTOMY;  Surgeon: Ivin Poot, MD;  Location: [facility];  Service: Thoracic;  Laterality: Right;  RIGHT UPPER LOBECTOMY  . Video assisted thoracoscopy Right 09/14/2012    Procedure: VIDEO ASSISTED THORACOSCOPY;  Surgeon: Ivin Poot, MD;  Location: [facility];  Service: Thoracic;  Laterality: Right;  . Video bronchoscopy with endobronchial ultrasound N/A 08/27/2014    Procedure: VIDEO BRONCHOSCOPY WITH ENDOBRONCHIAL ULTRASOUND;  Surgeon: Ivin Poot, MD;  Location: [facility];  Service: Thoracic;  Laterality: N/A;  . Scalene node biopsy Right 08/27/2014    Procedure: BIOPSY SCALENE NODE;  Surgeon: Ivin Poot, MD;  Location: [facility];  Service: Thoracic;  Laterality: Right;  . Sterotactic radiosurgery Right 09/25/14    right parietal 30Gy/1 fx     Current Outpatient Prescriptions  Medication Sig Dispense Refill  . amLODipine (NORVASC) 10 MG tablet Take 5 mg by mouth daily. Takes half a tablet    .  aspirin EC 81 MG tablet Take 81 mg by mouth daily.     . clonazePAM (KLONOPIN) 0.5 MG tablet Take 0.5 mg by mouth 2 (two) times daily as needed for anxiety. Takes one tablet by mouth two times daily as needed for anxiety.    Marland Kitchen dexamethasone (DECADRON) 4 MG tablet 2 tablets by mouth twice a day the day before, day of and day after the chemotherapy every 3 weeks. 30 tablet 1  . guaiFENesin (MUCINEX) 600 MG 12 hr tablet Take 1,200 mg by mouth 2 (two) times daily. Reported on 06/30/2015    . HYDROcodone-acetaminophen (NORCO) 7.5-325 MG tablet Take 1 tablet by mouth every 6 (six) hours as needed for moderate pain. 45 tablet 0  . ipratropium-albuterol (DUONEB) 0.5-2.5 (3) MG/3ML SOLN Take 3 mLs by nebulization 4 (four) times daily. (Patient taking  differently: Take 3 mLs by nebulization 4 (four) times daily. Pt takes nebulizer 2 to 3 times daily.) 360 mL 2  . isosorbide mononitrate (IMDUR) 30 MG 24 hr tablet Take 1 tablet (30 mg total) by mouth daily. 30 tablet 4  . lisinopril (PRINIVIL,ZESTRIL) 40 MG tablet Take 40 mg by mouth daily.    . metoprolol tartrate (LOPRESSOR) 25 MG tablet Take 25 mg by mouth 2 (two) times daily.     Marland Kitchen omeprazole (PRILOSEC) 40 MG capsule Take 40 mg by mouth daily.    . pramipexole (MIRAPEX) 1 MG tablet Take 1 mg by mouth at bedtime.    . prochlorperazine (COMPAZINE) 10 MG tablet Take 10 mg by mouth every 6 (six) hours as needed for nausea or vomiting. Reported on 09/10/2015    . traMADol (ULTRAM) 50 MG tablet Take 1 tablet (50 mg total) by mouth every 12 (twelve) hours as needed. (Patient taking differently: Take 50 mg by mouth every 12 (twelve) hours as needed (PAIN). ) 30 tablet 0  . zolpidem (AMBIEN) 10 MG tablet Take 10 mg by mouth at bedtime as needed for sleep. Reported on 06/30/2015    . furosemide (LASIX) 20 MG tablet Change to Lasix 20 mg BID x 7 days; then return to 20 mg Q AM. (Patient not taking: Reported on 09/24/2015) 45 tablet 0   No current facility-administered medications for this visit.   Facility-Administered Medications Ordered in Other Visits  Medication Dose Route Frequency Provider Last Rate Last Dose  . sodium chloride 0.9 % injection 10 mL  10 mL Intracatheter PRN Curt Bears, MD   10 mL at 01/29/15 0944    Allergies:   Review of patient's allergies indicates no known allergies.    Social History:  The patient  reports that he quit smoking about 13 months ago. His smoking use included Cigarettes. He has a 55 pack-year smoking history. He quit smokeless tobacco use about 63 years ago. He reports that he drinks alcohol. He reports that he does not use illicit drugs.   Family History:  [name] family history includes Alcohol abuse in his mother; Depression in his sister; Heart  attack in his brother and father; Heart disease in his brother and father; Stroke in his brother.    ROS:  Please see the history of present illness.   Otherwise, review of systems are positive for Fatigue.   All other systems are reviewed and negative.    PHYSICAL EXAM: VS:  BP 110/70 mmHg  Pulse 95  Ht '5\' 9"'$  (1.753 m)  Wt 178 lb 3.2 oz (80.831 kg)  BMI 26.30 kg/m2 , BMI Body mass index  is 26.3 kg/(m^2). GEN: Well nourished, well developed, in no acute distress HEENT: normal Neck: no JVD, bilateral carotid bruits, no masses Cardiac: RRR; no murmurs, rubs, or gallops,no edema  Respiratory:  clear to auscultation bilaterally, normal work of breathing GI: soft, nontender, nondistended, + BS MS: no deformity or atrophy Skin: warm and dry, no rash Neuro:  Strength and sensation are intact Psych: euthymic mood, full affect   Recent Labs: 04/28/2015: TSH 2.290 06/20/2015: B Natriuretic Peptide 156.6* 09/17/2015: ALT <9; BUN 7.9; Creatinine 0.9; HGB 10.2*; Platelets 136*; Potassium 4.2; Sodium 138   Lipid Panel    Component Value Date/Time   CHOL 145 01/17/2011 0320   TRIG 66 01/17/2011 0320   HDL 57 01/17/2011 0320   CHOLHDL 2.5 01/17/2011 0320   VLDL 13 01/17/2011 0320   LDLCALC 75 01/17/2011 0320     Other studies Reviewed: Additional studies/ records that were reviewed today with results demonstrating: Echo in April 2016 showed normal LV function and normal valvular function area.   ASSESSMENT AND PLAN:  1. CAD: Known CTO of the RCA. No angina. Continue aggressive medical therapy.  2. PAD: Known right subclavian stenosis. He is not having any weakness of the right arm. Continue to monitor. No indication for angiography at this time. He will f/u for carotid Doppler when chemo is done. 3. Tachycardia: This has improved. Continue current medications. 4. Volume overload: On occasion, this occurs. The wife is pretty good about holding Lasix when he is not eating well or when he  does not have swelling.   Current medicines are reviewed at length with the patient today.  The patient concerns regarding his medicines were addressed.  The following changes have been made:  No change  Labs/ tests ordered today include:  Orders Placed This Encounter  Procedures  . EKG 12-Lead    Recommend 150 minutes/week of aerobic exercise Low fat, low carb, high fiber diet recommended  Disposition:   FU in 1 year   Teresita Madura., MD  09/24/2015 4:31 PM    [facility] [address], [place], [place]  [address] Phone: [phone]; Fax: [phone]

## 2015-09-25 ENCOUNTER — Ambulatory Visit (HOSPITAL_BASED_OUTPATIENT_CLINIC_OR_DEPARTMENT_OTHER): Payer: Medicare Other

## 2015-09-25 ENCOUNTER — Encounter: Payer: Self-pay | Admitting: Radiation Therapy

## 2015-09-25 ENCOUNTER — Other Ambulatory Visit (HOSPITAL_BASED_OUTPATIENT_CLINIC_OR_DEPARTMENT_OTHER): Payer: Medicare Other

## 2015-09-25 ENCOUNTER — Other Ambulatory Visit: Payer: Self-pay | Admitting: *Deleted

## 2015-09-25 ENCOUNTER — Ambulatory Visit: Payer: Medicare Other

## 2015-09-25 ENCOUNTER — Telehealth: Payer: Self-pay | Admitting: Internal Medicine

## 2015-09-25 ENCOUNTER — Ambulatory Visit (HOSPITAL_BASED_OUTPATIENT_CLINIC_OR_DEPARTMENT_OTHER): Payer: Medicare Other | Admitting: Nurse Practitioner

## 2015-09-25 ENCOUNTER — Telehealth: Payer: Self-pay | Admitting: *Deleted

## 2015-09-25 ENCOUNTER — Other Ambulatory Visit: Payer: Self-pay | Admitting: Radiation Therapy

## 2015-09-25 ENCOUNTER — Encounter: Payer: Self-pay | Admitting: Nurse Practitioner

## 2015-09-25 VITALS — BP 136/61 | HR 79 | Resp 19

## 2015-09-25 DIAGNOSIS — C3411 Malignant neoplasm of upper lobe, right bronchus or lung: Secondary | ICD-10-CM | POA: Diagnosis present

## 2015-09-25 DIAGNOSIS — D491 Neoplasm of unspecified behavior of respiratory system: Secondary | ICD-10-CM

## 2015-09-25 DIAGNOSIS — C3491 Malignant neoplasm of unspecified part of right bronchus or lung: Secondary | ICD-10-CM

## 2015-09-25 DIAGNOSIS — Z5112 Encounter for antineoplastic immunotherapy: Secondary | ICD-10-CM | POA: Diagnosis present

## 2015-09-25 DIAGNOSIS — C7949 Secondary malignant neoplasm of other parts of nervous system: Principal | ICD-10-CM

## 2015-09-25 DIAGNOSIS — Z5111 Encounter for antineoplastic chemotherapy: Secondary | ICD-10-CM | POA: Diagnosis not present

## 2015-09-25 DIAGNOSIS — J418 Mixed simple and mucopurulent chronic bronchitis: Secondary | ICD-10-CM

## 2015-09-25 DIAGNOSIS — L271 Localized skin eruption due to drugs and medicaments taken internally: Secondary | ICD-10-CM

## 2015-09-25 DIAGNOSIS — C7931 Secondary malignant neoplasm of brain: Secondary | ICD-10-CM | POA: Diagnosis not present

## 2015-09-25 DIAGNOSIS — C3400 Malignant neoplasm of unspecified main bronchus: Secondary | ICD-10-CM | POA: Insufficient documentation

## 2015-09-25 DIAGNOSIS — Z85118 Personal history of other malignant neoplasm of bronchus and lung: Secondary | ICD-10-CM

## 2015-09-25 LAB — COMPREHENSIVE METABOLIC PANEL
ALBUMIN: 2.9 g/dL — AB (ref 3.5–5.0)
ALK PHOS: 76 U/L (ref 40–150)
ALT: <9 U/L (ref 0–55)
ANION GAP: 7 meq/L (ref 3–11)
AST: 13 U/L (ref 5–34)
BUN: 7.6 mg/dL (ref 7.0–26.0)
CALCIUM: 9.4 mg/dL (ref 8.4–10.4)
CO2: 30 mEq/L — ABNORMAL HIGH (ref 22–29)
Chloride: 99 mEq/L (ref 98–109)
Creatinine: 0.9 mg/dL (ref 0.7–1.3)
EGFR: 87 mL/min/{1.73_m2} — AB (ref >90)
GLUCOSE: 110 mg/dL (ref 70–140)
Potassium: 4.1 mEq/L (ref 3.5–5.1)
SODIUM: 136 meq/L (ref 136–145)
TOTAL PROTEIN: 6.6 g/dL (ref 6.4–8.3)

## 2015-09-25 LAB — CBC WITH DIFFERENTIAL/PLATELET
BASO%: 0.7 % (ref 0.0–2.0)
BASOS ABS: 0 10*3/uL (ref 0.0–0.1)
EOS ABS: 0 10*3/uL (ref 0.0–0.5)
EOS%: 0 % (ref 0.0–7.0)
HEMATOCRIT: 30.7 % — AB (ref 38.4–49.9)
HEMOGLOBIN: 9.7 g/dL — AB (ref 13.0–17.1)
LYMPH#: 1.6 10*3/uL (ref 0.9–3.3)
LYMPH%: 29.8 % (ref 14.0–49.0)
MCH: 31.5 pg (ref 27.2–33.4)
MCHC: 31.6 g/dL — ABNORMAL LOW (ref 32.0–36.0)
MCV: 99.7 fL — AB (ref 79.3–98.0)
MONO#: 0.8 10*3/uL (ref 0.1–0.9)
MONO%: 14.2 % — ABNORMAL HIGH (ref 0.0–14.0)
NEUT#: 3 10*3/uL (ref 1.5–6.5)
NEUT%: 55.3 % (ref 39.0–75.0)
NRBC: 0 % (ref 0–0)
PLATELETS: 294 10*3/uL (ref 140–400)
RBC: 3.08 10*6/uL — ABNORMAL LOW (ref 4.20–5.82)
RDW: 15.7 % — AB (ref 11.0–14.6)
WBC: 5.4 10*3/uL (ref 4.0–10.3)

## 2015-09-25 MED ORDER — ACETAMINOPHEN 325 MG PO TABS
ORAL_TABLET | ORAL | Status: AC
  Filled 2015-09-25: qty 2

## 2015-09-25 MED ORDER — SODIUM CHLORIDE 0.9 % IV SOLN
10.0000 mg/kg | Freq: Once | INTRAVENOUS | Status: AC
  Administered 2015-09-25: 800 mg via INTRAVENOUS
  Filled 2015-09-25: qty 80

## 2015-09-25 MED ORDER — SODIUM CHLORIDE 0.9 % IV SOLN
Freq: Once | INTRAVENOUS | Status: AC
  Administered 2015-09-25: 11:00:00 via INTRAVENOUS

## 2015-09-25 MED ORDER — SODIUM CHLORIDE 0.9 % IV SOLN
Freq: Once | INTRAVENOUS | Status: AC
  Administered 2015-09-25: 11:00:00 via INTRAVENOUS
  Filled 2015-09-25: qty 4

## 2015-09-25 MED ORDER — DOXYCYCLINE HYCLATE 100 MG PO TABS: 100.0000 mg | ORAL_TABLET | Freq: Two times a day (BID) | ORAL | Status: DC

## 2015-09-25 MED ORDER — AMLODIPINE BESYLATE 5 MG PO TABS: 5.0000 mg | ORAL_TABLET | Freq: Every day | ORAL | Status: DC

## 2015-09-25 MED ORDER — HYDROCODONE-ACETAMINOPHEN 7.5-325 MG PO TABS: 1.0000 | ORAL_TABLET | Freq: Four times a day (QID) | ORAL | Status: DC | PRN

## 2015-09-25 MED ORDER — DIPHENHYDRAMINE HCL 50 MG/ML IJ SOLN
INTRAMUSCULAR | Status: AC
  Filled 2015-09-25: qty 1

## 2015-09-25 MED ORDER — DIPHENHYDRAMINE HCL 50 MG/ML IJ SOLN
50.0000 mg | Freq: Once | INTRAMUSCULAR | Status: AC
  Administered 2015-09-25: 50 mg via INTRAVENOUS

## 2015-09-25 MED ORDER — SODIUM CHLORIDE 0.9 % IJ SOLN
10.0000 mL | INTRAMUSCULAR | Status: DC | PRN
  Administered 2015-09-25: 10 mL
  Filled 2015-09-25: qty 10

## 2015-09-25 MED ORDER — ACETAMINOPHEN 325 MG PO TABS
650.0000 mg | ORAL_TABLET | Freq: Once | ORAL | Status: AC
  Administered 2015-09-25: 650 mg via ORAL

## 2015-09-25 MED ORDER — HEPARIN SOD (PORK) LOCK FLUSH 100 UNIT/ML IV SOLN
500.0000 [IU] | Freq: Once | INTRAVENOUS | Status: AC | PRN
  Administered 2015-09-25: 500 [IU]
  Filled 2015-09-25: qty 5

## 2015-09-25 MED ORDER — DOCETAXEL CHEMO INJECTION 160 MG/16ML
75.0000 mg/m2 | Freq: Once | INTRAVENOUS | Status: AC
  Administered 2015-09-25: 150 mg via INTRAVENOUS
  Filled 2015-09-25: qty 15

## 2015-09-25 NOTE — Progress Notes (Signed)
[facility] [phone]   [phone]  OFFICE PROGRESS NOTE  [name], PA-C [place] [address]  DIAGNOSIS: Metastatic non-small cell lung cancer, squamous cell carcinoma initially diagnosed as Stage IIA (T2b., N0, M0) non-small cell lung cancer, squamous cell carcinoma diagnosed in December of 2013   PRIOR THERAPY:  1) Status post right upper lobectomy under the care of Dr. Prescott Gum on 09/14/2012.  2) Adjuvant chemotherapy with cisplatin 75 mg/M2 and docetaxel 75 mg/M2 with Neulasta support every 3 weeks, status post 1 cycle. Starting with cycle #2, patient will receive carboplatin for an AUC initially of 4.5 and paclitaxel at 175 mg per meter squared with Neulasta support given every 3 weeks. Status post a total of 3 cycles.  3) Systemic chemotherapy with carboplatin for AUC of 5 on day 1 and gemcitabine 1000 MG/M2 on days 1 and 8 every 3 weeks. First dose 09/11/2014. Status post 4 cycles. Discontinued secondary to intolerance 4) Immunotherapy with Nivolumab 3 MG/KG every 2 weeks. Status post 4 cycles. 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  CURRENT THERAPY: Systemic chemotherapy with docetaxel 75 MG/M2 and Cyramza 10 MG/KG every 3 weeks with Neulasta support. Status post 7 cycles  CHEMOTHERAPY INTENT: Palliative. CURRENT # OF CHEMOTHERAPY CYCLES: 7 CURRENT ANTIEMETICS: Zofran, dexamethasone and Compazine  CURRENT SMOKING STATUS: Quit smoking recently.  ORAL CHEMOTHERAPY AND CONSENT: None  CURRENT BISPHOSPHONATES USE: None  PAIN MANAGEMENT: 3/10 controlled by Norco when necessary  NARCOTICS INDUCED CONSTIPATION: Milk of magnesia on as-needed basis.  LIVING WILL AND CODE STATUS: No CODE BLUE.   INTERVAL HISTORY:  [name] 75 y.o. male returns to the clinic today for followup visit, accompanied by his wife. The patient is currently undergoing systemic chemotherapy  with docetaxel and Cyramza status post 7 cycles and has been tolerating his treatment fairly well with no significant complaints except for fatigue. He is on 2LO2 continuously. He is short of breath with minimal exertion. Her has a chronic productive cough. He denies hemoptysis but does have nose bleeds because the oxygen dries out his nares. His appetite is down. He denies fevers, chills, nausea, or vomiting. He is frequently constipated.   MEDICAL HISTORY: Past Medical History  Diagnosis Date  . Arthritis   . Wheezing   . Sore throat   . Carotid artery occlusion   . COPD (chronic obstructive pulmonary disease) ([place])   . Lumbar disc disease   . Restless leg syndrome   . Allergic rhinitis   . Hypertension   . Anxiety     anxiety  . Shortness of breath   . Lung mass   . GERD (gastroesophageal reflux disease)   . H/O hiatal hernia   . Pre-operative cardiovascular examination   . Nonspecific abnormal unspecified cardiovascular function study   . Peripheral vascular disease, unspecified ([place])   . Pneumonia 2014    ?   Marland Kitchen On home oxygen therapy     prn  . Constipation   . Cancer ([place]) 09/14/12    invasive mod diff squamous cell ca  . 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 S/P radiation therapy 12/04/14 srs    lt frontal brain    ALLERGIES:  has No Known Allergies.  MEDICATIONS:  Current Outpatient Prescriptions  Medication Sig Dispense Refill  . amLODipine (NORVASC) 10 MG tablet Take 5 mg by mouth daily. Pt will take additonal  1/2 tablet when BP is elevated    . aspirin EC 81 MG tablet Take 81 mg by mouth daily.     . clonazePAM (KLONOPIN) 0.5 MG tablet Take 0.5 mg by mouth 2 (two) times daily as needed for anxiety. Takes one tablet by mouth two times daily as needed for anxiety.    Marland Kitchen guaiFENesin (MUCINEX) 600 MG 12 hr tablet Take 1,200 mg by mouth 2 (two) times daily. Reported on 06/30/2015    . ipratropium-albuterol (DUONEB) 0.5-2.5 (3) MG/3ML SOLN Take 3 mLs by  nebulization 4 (four) times daily. (Patient taking differently: Take 3 mLs by nebulization 4 (four) times daily. Pt takes nebulizer 2 to 3 times daily.) 360 mL 2  . isosorbide mononitrate (IMDUR) 30 MG 24 hr tablet Take 1 tablet (30 mg total) by mouth daily. 30 tablet 4  . lisinopril (PRINIVIL,ZESTRIL) 40 MG tablet Take 40 mg by mouth daily.    . metoprolol tartrate (LOPRESSOR) 25 MG tablet Take 25 mg by mouth 2 (two) times daily.     Marland Kitchen omeprazole (PRILOSEC) 40 MG capsule Take 40 mg by mouth daily.    . pramipexole (MIRAPEX) 1 MG tablet Take 1 mg by mouth at bedtime.    . traMADol (ULTRAM) 50 MG tablet Take 1 tablet (50 mg total) by mouth every 12 (twelve) hours as needed. (Patient taking differently: Take 50 mg by mouth every 12 (twelve) hours as needed (PAIN). ) 30 tablet 0  . dexamethasone (DECADRON) 4 MG tablet 2 tablets by mouth twice a day the day before, day of and day after the chemotherapy every 3 weeks. (Patient not taking: Reported on 09/25/2015) 30 tablet 1  . furosemide (LASIX) 20 MG tablet Change to Lasix 20 mg BID x 7 days; then return to 20 mg Q AM. (Patient not taking: Reported on 09/24/2015) 45 tablet 0  . HYDROcodone-acetaminophen (NORCO) 7.5-325 MG tablet Take 1 tablet by mouth every 6 (six) hours as needed for moderate pain. 45 tablet 0  . prochlorperazine (COMPAZINE) 10 MG tablet Take 10 mg by mouth every 6 (six) hours as needed for nausea or vomiting. Reported on 09/25/2015    . zolpidem (AMBIEN) 10 MG tablet Take 10 mg by mouth at bedtime as needed for sleep. Reported on 09/25/2015     No current facility-administered medications for this visit.   Facility-Administered Medications Ordered in Other Visits  Medication Dose Route Frequency Provider Last Rate Last Dose  . sodium chloride 0.9 % injection 10 mL  10 mL Intracatheter PRN Curt Bears, MD   10 mL at 01/29/15 0944    SURGICAL HISTORY:  Past Surgical History  Procedure Laterality Date  . Carotid endarterectomy   10/15/2010    left  . Hand surgery      repair of the left long, ring, and small finger after a saw injury  . Video bronchoscopy  07/24/2012    Procedure: VIDEO BRONCHOSCOPY WITH FLUORO;  Surgeon: Kathee Delton, MD;  Location: [facility];  Service: Cardiopulmonary;  Laterality: Bilateral;  . Lobectomy Right 09/14/2012    Procedure: LOBECTOMY;  Surgeon: Ivin Poot, MD;  Location: [facility];  Service: Thoracic;  Laterality: Right;  RIGHT UPPER LOBECTOMY  . Video assisted thoracoscopy Right 09/14/2012    Procedure: VIDEO ASSISTED THORACOSCOPY;  Surgeon: Ivin Poot, MD;  Location: [facility];  Service: Thoracic;  Laterality: Right;  . Video bronchoscopy with endobronchial ultrasound N/A 08/27/2014    Procedure: VIDEO BRONCHOSCOPY WITH ENDOBRONCHIAL ULTRASOUND;  Surgeon: Collier Salina  Prescott Gum, MD;  Location: [facility];  Service: Thoracic;  Laterality: N/A;  . Scalene node biopsy Right 08/27/2014    Procedure: BIOPSY SCALENE NODE;  Surgeon: Ivin Poot, MD;  Location: [facility];  Service: Thoracic;  Laterality: Right;  . Sterotactic radiosurgery Right 09/25/14    right parietal 30Gy/1 fx    REVIEW OF SYSTEMS:  A comprehensive review of systems was negative except for: Constitutional: positive for fatigue Respiratory: positive for cough and dyspnea on exertion Gastrointestinal: positive for constipation   PHYSICAL EXAMINATION: General appearance: alert, cooperative and no distress Head: Normocephalic, without obvious abnormality, atraumatic Neck: no adenopathy, no JVD, supple, symmetrical, trachea midline and thyroid not enlarged, symmetric, no tenderness/mass/nodules Lymph nodes: Cervical, supraclavicular, and axillary nodes normal. Resp: wheezes bilaterally Back: symmetric, no curvature. ROM normal. No CVA tenderness. Cardio: regular rate and rhythm, S1, S2 normal, no murmur, click, rub or gallop GI: soft, non-tender; bowel sounds normal; no masses,  no organomegaly Extremities: extremities normal,  atraumatic, no cyanosis or edema Neurologic: Alert and oriented X 3, normal strength and tone. Normal symmetric reflexes. Normal coordination and gait  ECOG PERFORMANCE STATUS: 1 - Symptomatic but completely ambulatory  Blood pressure 166/67, pulse 101, temperature 98.1 F (36.7 C), temperature source Oral, resp. rate 18, height '5\' 9"'$  (1.753 m), weight 178 lb 12.8 oz (81.103 kg), SpO2 100 %.  LABORATORY DATA: Lab Results  Component Value Date   WBC 5.4 09/25/2015   HGB 9.7* 09/25/2015   HCT 30.7* 09/25/2015   MCV 99.7* 09/25/2015   PLT 294 09/25/2015      Chemistry      Component Value Date/Time   NA 136 09/25/2015 0858   NA 138 12/05/2014 0618   K 4.1 09/25/2015 0858   K 3.4* 12/05/2014 0618   CL 95* 12/05/2014 0618   CL 101 12/28/2012 0852   CO2 30* 09/25/2015 0858   CO2 32 12/05/2014 0618   BUN 7.6 09/25/2015 0858   BUN 9 12/05/2014 0618   CREATININE 0.9 09/25/2015 0858   CREATININE 0.52* 12/05/2014 0618      Component Value Date/Time   CALCIUM 9.4 09/25/2015 0858   CALCIUM 9.2 12/05/2014 0618   ALKPHOS 76 09/25/2015 0858   ALKPHOS 71 12/05/2014 0618   AST 13 09/25/2015 0858   AST 24 12/05/2014 0618   ALT <9 09/25/2015 0858   ALT 30 12/05/2014 0618   BILITOT <0.30 09/25/2015 0858   BILITOT 0.2* 12/05/2014 0618       RADIOGRAPHIC STUDIES: No results found.  ASSESSMENT AND PLAN: This is a very pleasant 75 years old white male with history of stage IIA non-small cell lung cancer status post right upper lobectomy followed by 4 cycles of adjuvant chemotherapy.  The patient underwent systemic chemotherapy with carboplatin and gemcitabine status post 4 cycles discontinued secondary to intolerance and mild disease progression.  The patient completed treatment with Nivolumab status post 4 cycles but this was discontinued secondary to disease progression. He also recently completed stereotactic radiotherapy to metastatic brain lesions. The patient is currently  undergoing systemic chemotherapy with docetaxel and Cyramza, status post 7 cycles, and continues to tolerate this well.  He will proceed with cycle #8 of docetaxel and cyramza today as planned. He has been drinking only soda for the past few weeks. I have advised him to increase his water intake.  He will also look into a supplemental shake such as Boost or Ensure. His norco was refilled during this visit. He will return in  3 weeks for follow up. Prior to this visit he will have a repeat CT of the chest, abdomen, and pelvis. Marland Kitchen He was advised to call immediately if he has any concerning symptoms in the interval.  The patient voices understanding of current disease status and treatment options and is in agreement with the current care plan.  All questions were answered. The patient knows to call the clinic with any problems, questions or concerns. We can certainly see the patient much sooner if necessary.  Laurie Panda, NP 09/25/2015

## 2015-09-25 NOTE — Telephone Encounter (Signed)
Gave and printed appt sched and avs for pt for April °

## 2015-09-25 NOTE — Patient Instructions (Signed)
[facility] Discharge Instructions for Patients Receiving Chemotherapy  Today you received the following chemotherapy agents Docetaxel/Cyramza.  To help prevent nausea and vomiting after your treatment, we encourage you to take your nausea medication as directed.    If you develop nausea and vomiting that is not controlled by your nausea medication, call the clinic.   BELOW ARE SYMPTOMS THAT SHOULD BE REPORTED IMMEDIATELY:  *FEVER GREATER THAN 100.5 F  *CHILLS WITH OR WITHOUT FEVER  NAUSEA AND VOMITING THAT IS NOT CONTROLLED WITH YOUR NAUSEA MEDICATION  *UNUSUAL SHORTNESS OF BREATH  *UNUSUAL BRUISING OR BLEEDING  TENDERNESS IN MOUTH AND THROAT WITH OR WITHOUT PRESENCE OF ULCERS  *URINARY PROBLEMS  *BOWEL PROBLEMS  UNUSUAL RASH Items with * indicate a potential emergency and should be followed up as soon as possible.  Feel free to call the clinic you have any questions or concerns. The clinic phone number is [phone].  Please show the [facility] at check-in to the Emergency Department and triage nurse.

## 2015-09-25 NOTE — Telephone Encounter (Signed)
Gave and printed new updated sched

## 2015-09-25 NOTE — Progress Notes (Signed)
.   Do you need a wheel chair? No  2. On oxygen? Yes  3. Have you ever had any surgery in the body part being scanned? No  4. Have you ever had any surgery on your brain or heart?Heart cath done on 09/08/12 w/ coranary angiogram  5. Have you ever had surgery on your eyes or ears? No   6. Do you have a pacemaker or defibrillator? No   7. Do you have a Neurostimulator? No  8. Claustrophobic? No  9. Any risk for metal in eyes? No  10. Injury by bullet, buckshot, or shrapnel? No  11. Stent? No   12. Hx of Cancer? Yes , Lung Cancer with mets to the brain    13. Kidney or Liver disease? No  14. Hx of Lupus, Rheumatoid Arthritis or Scleroderma? No  15. IV Antibiotics or long term use of NSAIDS? No  16. HX of Hypertension? Yes  17. Diabetes? No  18. Allergy to contrast? No  19. Recent labs. Labs drawn 3/16 and in EPIC

## 2015-09-25 NOTE — Progress Notes (Signed)
[facility] to obtain re-certification for home/portable oxygen. Pt is certified until June 2017. He will need SpO2 testing with and without oxygen at that time. Spoke to patient in infusion room to advise June deadline.

## 2015-09-25 NOTE — Telephone Encounter (Signed)
Called patient at home and spoke to wife about changing injection appt on 09/26/15 to later in the day.  Patient and wife aware of new time (2:00pm)

## 2015-09-26 ENCOUNTER — Encounter: Payer: Self-pay | Admitting: Nurse Practitioner

## 2015-09-26 ENCOUNTER — Other Ambulatory Visit: Payer: Self-pay | Admitting: Nurse Practitioner

## 2015-09-26 ENCOUNTER — Ambulatory Visit (HOSPITAL_BASED_OUTPATIENT_CLINIC_OR_DEPARTMENT_OTHER): Payer: Medicare Other

## 2015-09-26 VITALS — BP 135/76 | HR 85 | Temp 97.7°F

## 2015-09-26 DIAGNOSIS — C3491 Malignant neoplasm of unspecified part of right bronchus or lung: Secondary | ICD-10-CM

## 2015-09-26 DIAGNOSIS — Z5189 Encounter for other specified aftercare: Secondary | ICD-10-CM

## 2015-09-26 DIAGNOSIS — C3411 Malignant neoplasm of upper lobe, right bronchus or lung: Secondary | ICD-10-CM

## 2015-09-26 DIAGNOSIS — L271 Localized skin eruption due to drugs and medicaments taken internally: Secondary | ICD-10-CM | POA: Insufficient documentation

## 2015-09-26 DIAGNOSIS — E8770 Fluid overload, unspecified: Secondary | ICD-10-CM | POA: Insufficient documentation

## 2015-09-26 MED ORDER — PEGFILGRASTIM INJECTION 6 MG/0.6ML ~~LOC~~
6.0000 mg | PREFILLED_SYRINGE | Freq: Once | SUBCUTANEOUS | Status: AC
Start: 2015-09-26 — End: 2015-09-26
  Administered 2015-09-26: 6 mg via SUBCUTANEOUS
  Filled 2015-09-26: qty 0.6

## 2015-09-26 NOTE — Assessment & Plan Note (Addendum)
Patient reports progressive peeling of the skin to his bilateral hands and upper extremities.  He also reports progressive nail discoloration and thickening.  He feels that some of his nails have become loose in the colon and the nails to his right hand.  Have also started emitting a foul odor.  Exam today does reveal thickened, discolored nails.  Some of the nails do appear to be loose.  Was unable to appreciate any foul odor, however.  Reviewed all findings with [name].  He recommended that patient try doxycycline antibiotics for any nail infection.  Most likely, the hand/foot syndrome is a side effect of the Taxotere.  [name] and will consider decreasing the chemotherapy dose with his next cycle of chemotherapy.  Patient was advised to call/return of her directly to the emergency department for any worsening symptoms whatsoever.

## 2015-09-26 NOTE — Progress Notes (Signed)
SYMPTOM MANAGEMENT CLINIC   HPI: [name] 75 y.o. male diagnosed with lung cancer.  Currently undergoing docetaxel/cyramza chemotherapy regimen.   Patient reports progressive peeling of the skin to his bilateral hands and upper extremities.  He also reports progressive nail discoloration and thickening.  He feels that some of his nails have become loose in the colon and the nails to his right hand.  Have also started emitting a foul odor.  Exam today does reveal thickened, discolored nails.  Some of the nails do appear to be loose.  Was unable to appreciate any foul odor, however.  Reviewed all findings with [name].  He recommended that patient try doxycycline antibiotics for any nail infection.  Patient was advised to call/return of her directly to the emergency department for any worsening symptoms whatsoever.  HPI  Review of Systems  Skin:       Progressive symptoms of hand/foot syndrome with nail discoloration, thickening, and loosening of nails.  All other systems reviewed and are negative.   Past Medical History  Diagnosis Date  . Arthritis   . Wheezing   . Sore throat   . Carotid artery occlusion   . COPD (chronic obstructive pulmonary disease) ([place])   . Lumbar disc disease   . Restless leg syndrome   . Allergic rhinitis   . Hypertension   . Anxiety     anxiety  . Shortness of breath   . Lung mass   . GERD (gastroesophageal reflux disease)   . H/O hiatal hernia   . Pre-operative cardiovascular examination   . Nonspecific abnormal unspecified cardiovascular function study   . Peripheral vascular disease, unspecified ([place])   . Pneumonia 2014    ?   Marland Kitchen On home oxygen therapy     prn  . Constipation   . Cancer ([place]) 09/14/12    invasive mod diff squamous cell ca  . 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 S/P radiation therapy 12/04/14 srs    lt frontal brain    Past Surgical History  Procedure Laterality Date  . Carotid  endarterectomy  10/15/2010    left  . Hand surgery      repair of the left long, ring, and small finger after a saw injury  . Video bronchoscopy  07/24/2012    Procedure: VIDEO BRONCHOSCOPY WITH FLUORO;  Surgeon: Kathee Delton, MD;  Location: [facility];  Service: Cardiopulmonary;  Laterality: Bilateral;  . Lobectomy Right 09/14/2012    Procedure: LOBECTOMY;  Surgeon: Ivin Poot, MD;  Location: [facility];  Service: Thoracic;  Laterality: Right;  RIGHT UPPER LOBECTOMY  . Video assisted thoracoscopy Right 09/14/2012    Procedure: VIDEO ASSISTED THORACOSCOPY;  Surgeon: Ivin Poot, MD;  Location: [facility];  Service: Thoracic;  Laterality: Right;  . Video bronchoscopy with endobronchial ultrasound N/A 08/27/2014    Procedure: VIDEO BRONCHOSCOPY WITH ENDOBRONCHIAL ULTRASOUND;  Surgeon: Ivin Poot, MD;  Location: [facility] OR;  Service: Thoracic;  Laterality: N/A;  . Scalene node biopsy Right 08/27/2014    Procedure: BIOPSY SCALENE NODE;  Surgeon: Ivin Poot, MD;  Location: [facility] OR;  Service: Thoracic;  Laterality: Right;  . Sterotactic radiosurgery Right 09/25/14    right parietal 30Gy/1 fx    has Occlusion and stenosis of carotid artery without mention of cerebral infarction; COPD (chronic obstructive pulmonary disease) ([place]); Peripheral vascular disease, unspecified ([place]); Neoplasm of upper lobe of right lung ([place]); Coronary atherosclerosis of native coronary  artery; Essential hypertension, benign; Tobacco use disorder; Carotid stenosis; Aftercare following surgery of the circulatory system; Weakness-Hand and  Legs; COPD exacerbation ([place]); Tachycardia; Dyspnea; Brain metastasis ([place]); Chemotherapy induced neutropenia ([place]); Renal stone; Pyelonephritis; Dehydration; UTI (lower urinary tract infection); Left arm cellulitis; Blood poisoning ([place]); Sepsis ([place]); Encounter for antineoplastic immunotherapy; Cancer associated pain; Rash; Syncope; Constipation; Insomnia; Hypokalemia; Extravasation, infusion or  chemotherapeutic agent; Epistaxis; Metastatic lung cancer (metastasis from lung to other site) [facility]); Poor venous access; 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    has No Known Allergies.    Medication List       This list is accurate as of: 09/25/15 11:59 PM.  Always use your most recent med list.               amLODipine 5 MG tablet  Commonly known as:  NORVASC  Take 1 tablet (5 mg total) by mouth daily. May take an additional tablet as needed for elevated blood pressure.     aspirin EC 81 MG tablet  Take 81 mg by mouth daily.     clonazePAM 0.5 MG tablet  Commonly known as:  KLONOPIN  Take 0.5 mg by mouth 2 (two) times daily as needed for anxiety. Takes one tablet by mouth two times daily as needed for anxiety.     dexamethasone 4 MG tablet  Commonly known as:  DECADRON  2 tablets by mouth twice a day the day before, day of and day after the chemotherapy every 3 weeks.     doxycycline 100 MG tablet  Commonly known as:  VIBRA-TABS  Take 1 tablet (100 mg total) by mouth 2 (two) times daily.     furosemide 20 MG tablet  Commonly known as:  LASIX  Change to Lasix 20 mg BID x 7 days; then return to 20 mg Q AM.     guaiFENesin 600 MG 12 hr tablet  Commonly known as:  MUCINEX  Take 1,200 mg by mouth 2 (two) times daily. Reported on 06/30/2015     HYDROcodone-acetaminophen 7.5-325 MG tablet  Commonly known as:  NORCO  Take 1 tablet by mouth every 6 (six) hours as needed for moderate pain.     ipratropium-albuterol 0.5-2.5 (3) MG/3ML Soln  Commonly known as:  DUONEB  Take 3 mLs by nebulization 4 (four) times daily.     isosorbide mononitrate 30 MG 24 hr tablet  Commonly known as:  IMDUR  Take 1 tablet (30 mg total) by mouth daily.     lisinopril 40 MG tablet  Commonly known as:   PRINIVIL,ZESTRIL  Take 40 mg by mouth daily.     metoprolol tartrate 25 MG tablet  Commonly known as:  LOPRESSOR  Take 25 mg by mouth 2 (two) times daily.     omeprazole 40 MG capsule  Commonly known as:  PRILOSEC  Take 40 mg by mouth daily.     pramipexole 1 MG tablet  Commonly known as:  MIRAPEX  Take 1 mg by mouth at bedtime.     prochlorperazine 10 MG tablet  Commonly known as:  COMPAZINE  Take 10 mg by mouth every 6 (six) hours as needed for nausea or vomiting. Reported on 09/25/2015     traMADol 50 MG tablet  Commonly known as:  ULTRAM  Take 1 tablet (50 mg total) by mouth every  12 (twelve) hours as needed.     zolpidem 10 MG tablet  Commonly known as:  AMBIEN  Take 10 mg by mouth at bedtime as needed for sleep. Reported on 09/25/2015         PHYSICAL EXAMINATION  Oncology Vitals 09/26/2015 09/25/2015  Height - -  Weight - -  Weight (lbs) - -  BMI (kg/m2) - -  Temp 97.7 -  Pulse 85 79  Resp - 19  SpO2 100 100  BSA (m2) - -   BP Readings from Last 2 Encounters:  09/26/15 135/76  09/25/15 136/61    Physical Exam  Constitutional: He appears malnourished. He appears unhealthy. He appears cachectic.  Skin:  Exam today does reveal thickened, discolored nails.  Some of the nails do appear to be loose.  Was unable to appreciate any foul odor, however.   Nursing note and vitals reviewed.   LABORATORY DATA:. Appointment on 09/25/2015  Component Date Value Ref Range Status  . WBC 09/25/2015 5.4  4.0 - 10.3 10e3/uL Final  . NEUT# 09/25/2015 3.0  1.5 - 6.5 10e3/uL Final  . HGB 09/25/2015 9.7* 13.0 - 17.1 g/dL Final  . HCT 09/25/2015 30.7* 38.4 - 49.9 % Final  . Platelets 09/25/2015 294  140 - 400 10e3/uL Final  . MCV 09/25/2015 99.7* 79.3 - 98.0 fL Final  . MCH 09/25/2015 31.5  27.2 - 33.4 pg Final  . MCHC 09/25/2015 31.6* 32.0 - 36.0 g/dL Final  . RBC 09/25/2015 3.08* 4.20 - 5.82 10e6/uL Final  . RDW 09/25/2015 15.7* 11.0 - 14.6 % Final  . lymph# 09/25/2015  1.6  0.9 - 3.3 10e3/uL Final  . MONO# 09/25/2015 0.8  0.1 - 0.9 10e3/uL Final  . Eosinophils Absolute 09/25/2015 0.0  0.0 - 0.5 10e3/uL Final  . Basophils Absolute 09/25/2015 0.0  0.0 - 0.1 10e3/uL Final  . NEUT% 09/25/2015 55.3  39.0 - 75.0 % Final  . LYMPH% 09/25/2015 29.8  14.0 - 49.0 % Final  . MONO% 09/25/2015 14.2* 0.0 - 14.0 % Final  . EOS% 09/25/2015 0.0  0.0 - 7.0 % Final  . BASO% 09/25/2015 0.7  0.0 - 2.0 % Final  . nRBC 09/25/2015 0  0 - 0 % Final  . Sodium 09/25/2015 136  136 - 145 mEq/L Final  . Potassium 09/25/2015 4.1  3.5 - 5.1 mEq/L Final  . Chloride 09/25/2015 99  98 - 109 mEq/L Final  . CO2 09/25/2015 30* 22 - 29 mEq/L Final  . Glucose 09/25/2015 110  70 - 140 mg/dl Final   Glucose reference range is for nonfasting patients. Fasting glucose reference range is 70- 100.  Marland Kitchen BUN 09/25/2015 7.6  7.0 - 26.0 mg/dL Final  . Creatinine 09/25/2015 0.9  0.7 - 1.3 mg/dL Final  . Total Bilirubin 09/25/2015 <0.30  0.20 - 1.20 mg/dL Final  . Alkaline Phosphatase 09/25/2015 76  40 - 150 U/L Final  . AST 09/25/2015 13  5 - 34 U/L Final  . ALT 09/25/2015 <9  0 - 55 U/L Final  . Total Protein 09/25/2015 6.6  6.4 - 8.3 g/dL Final  . Albumin 09/25/2015 2.9* 3.5 - 5.0 g/dL Final  . Calcium 09/25/2015 9.4  8.4 - 10.4 mg/dL Final  . Anion Gap 09/25/2015 7  3 - 11 mEq/L Final  . EGFR 09/25/2015 87* >90 ml/min/1.73 m2 Final   eGFR is calculated using the CKD-EPI Creatinine Equation (2009)    RADIOGRAPHIC STUDIES: No results found.  ASSESSMENT/PLAN:  Neoplasm of upper lobe of right lung [facility]) Patient presents to the [facility] today to receive cycle 8 of his docetaxel/cyramza chemotherapy regimen.  Patient is scheduled to return on 09/26/2015 for his Neulasta injection.  Patient is scheduled for labs on 10/01/2015.  He is scheduled for a brain MRI on 10/06/2015.  He is scheduled for repeat lab on 10/08/2015.  He is scheduled for labs, visit, and chemotherapy on 10/15/2015.  Hand  foot syndrome Patient reports progressive peeling of the skin to his bilateral hands and upper extremities.  He also reports progressive nail discoloration and thickening.  He feels that some of his nails have become loose in the colon and the nails to his right hand.  Have also started emitting a foul odor.  Exam today does reveal thickened, discolored nails.  Some of the nails do appear to be loose.  Was unable to appreciate any foul odor, however.  Reviewed all findings with [name].  He recommended that patient try doxycycline antibiotics for any nail infection.  Most likely, the hand/foot syndrome is a side effect of the Taxotere.  [name] and will consider decreasing the chemotherapy dose with his next cycle of chemotherapy.  Patient was advised to call/return of her directly to the emergency department for any worsening symptoms whatsoever.   Patient stated understanding of all instructions; and was in agreement with this plan of care. The patient knows to call the clinic with any problems, questions or concerns.   Review/collaboration with [name] regarding all aspects of patient's visit today.   Total time spent with patient was 15 minutes;  with greater than 75 percent of that time spent in face to face counseling regarding patient's symptoms,  and coordination of care and follow up.  Disclaimer:This dictation was prepared with Dragon/digital dictation along with [employer]. Any transcriptional errors that result from this process are unintentional.  Drue Second, NP 09/26/2015

## 2015-09-26 NOTE — Assessment & Plan Note (Signed)
Patient presents to the [facility] today to receive cycle 8 of his docetaxel/cyramza chemotherapy regimen.  Patient is scheduled to return on 09/26/2015 for his Neulasta injection.  Patient is scheduled for labs on 10/01/2015.  He is scheduled for a brain MRI on 10/06/2015.  He is scheduled for repeat lab on 10/08/2015.  He is scheduled for labs, visit, and chemotherapy on 10/15/2015.

## 2015-10-01 ENCOUNTER — Telehealth: Payer: Self-pay | Admitting: *Deleted

## 2015-10-01 ENCOUNTER — Other Ambulatory Visit (HOSPITAL_BASED_OUTPATIENT_CLINIC_OR_DEPARTMENT_OTHER): Payer: Medicare Other

## 2015-10-01 ENCOUNTER — Other Ambulatory Visit: Payer: Medicare Other

## 2015-10-01 DIAGNOSIS — C3491 Malignant neoplasm of unspecified part of right bronchus or lung: Secondary | ICD-10-CM

## 2015-10-01 DIAGNOSIS — C3411 Malignant neoplasm of upper lobe, right bronchus or lung: Secondary | ICD-10-CM

## 2015-10-01 LAB — CBC WITH DIFFERENTIAL/PLATELET
BASO%: 0.5 % (ref 0.0–2.0)
BASOS ABS: 0.1 10*3/uL (ref 0.0–0.1)
EOS ABS: 0 10*3/uL (ref 0.0–0.5)
EOS%: 0.3 % (ref 0.0–7.0)
HEMATOCRIT: 28.4 % — AB (ref 38.4–49.9)
HEMOGLOBIN: 8.8 g/dL — AB (ref 13.0–17.1)
LYMPH#: 1.9 10*3/uL (ref 0.9–3.3)
LYMPH%: 17.3 % (ref 14.0–49.0)
MCH: 30.9 pg (ref 27.2–33.4)
MCHC: 31 g/dL — ABNORMAL LOW (ref 32.0–36.0)
MCV: 99.6 fL — AB (ref 79.3–98.0)
MONO#: 0.4 10*3/uL (ref 0.1–0.9)
MONO%: 3.7 % (ref 0.0–14.0)
NEUT#: 8.5 10*3/uL — ABNORMAL HIGH (ref 1.5–6.5)
NEUT%: 78.2 % — AB (ref 39.0–75.0)
NRBC: 0 % (ref 0–0)
PLATELETS: 151 10*3/uL (ref 140–400)
RBC: 2.85 10*6/uL — ABNORMAL LOW (ref 4.20–5.82)
RDW: 16.1 % — ABNORMAL HIGH (ref 11.0–14.6)
WBC: 10.9 10*3/uL — ABNORMAL HIGH (ref 4.0–10.3)

## 2015-10-01 LAB — COMPREHENSIVE METABOLIC PANEL
ALBUMIN: 3 g/dL — AB (ref 3.5–5.0)
ALK PHOS: 90 U/L (ref 40–150)
ALT: 9 U/L (ref 0–55)
AST: 15 U/L (ref 5–34)
Anion Gap: 7 mEq/L (ref 3–11)
BUN: 15.5 mg/dL (ref 7.0–26.0)
CALCIUM: 8.8 mg/dL (ref 8.4–10.4)
CO2: 29 mEq/L (ref 22–29)
Chloride: 105 mEq/L (ref 98–109)
Creatinine: 0.9 mg/dL (ref 0.7–1.3)
EGFR: 86 mL/min/{1.73_m2} — AB (ref >90)
Glucose: 96 mg/dl (ref 70–140)
POTASSIUM: 3.9 meq/L (ref 3.5–5.1)
Sodium: 141 mEq/L (ref 136–145)
TOTAL PROTEIN: 6 g/dL — AB (ref 6.4–8.3)

## 2015-10-01 NOTE — Telephone Encounter (Signed)
Walk-in form completed requesting new medicine Tizanidine (Zanaflex) HCL 4 mg while here for lab work.  Spoke with patient and his wife [name] who report "[name] had a spell where he felt cold.  He then began to shake, body jumping, tremors so I gave him one of my Tizanidine HCL 4 mg tablets my doctor ordered for me for muscle tension.  It settled his body and he went to sleep.  I do not want to give this to him if it will interact with what he receives here and he needs an order for this sent to [facility].  This started three to four months ago.  Does not happen everyday but can occur up to three times in one day.  He does not have a fever when this happens.  Please call me at [phone] with his response."  [name] notified of this request.  Verbal order received and read back from [name] for patient to contact his PCP for this order.  Order given to [place] via return call to mobile at this time.  Otherwise no new orders received.

## 2015-10-06 ENCOUNTER — Ambulatory Visit
Admission: RE | Admit: 2015-10-06 | Discharge: 2015-10-06 | Disposition: A | Discharge status: Home / Self Care | Payer: Medicare Other | Source: Ambulatory Visit | Attending: Radiation Oncology | Admitting: Radiation Oncology

## 2015-10-06 ENCOUNTER — Other Ambulatory Visit: Payer: Medicare Other

## 2015-10-06 DIAGNOSIS — C7949 Secondary malignant neoplasm of other parts of nervous system: Principal | ICD-10-CM

## 2015-10-06 DIAGNOSIS — C7931 Secondary malignant neoplasm of brain: Secondary | ICD-10-CM

## 2015-10-06 MED ORDER — GADOBENATE DIMEGLUMINE 529 MG/ML IV SOLN
16.0000 mL | Freq: Once | INTRAVENOUS | Status: AC | PRN
  Administered 2015-10-06: 16 mL via INTRAVENOUS

## 2015-10-08 ENCOUNTER — Ambulatory Visit
Admission: RE | Admit: 2015-10-08 | Discharge: 2015-10-08 | Disposition: A | Discharge status: Home / Self Care | Payer: Medicare Other | Source: Ambulatory Visit | Attending: Radiation Oncology | Admitting: Radiation Oncology

## 2015-10-08 ENCOUNTER — Encounter: Payer: Self-pay | Admitting: Radiation Oncology

## 2015-10-08 ENCOUNTER — Other Ambulatory Visit (HOSPITAL_BASED_OUTPATIENT_CLINIC_OR_DEPARTMENT_OTHER): Payer: Medicare Other

## 2015-10-08 VITALS — BP 114/48 | HR 67 | Temp 98.2°F | Resp 22 | Ht 69.0 in | Wt 171.8 lb

## 2015-10-08 DIAGNOSIS — C3411 Malignant neoplasm of upper lobe, right bronchus or lung: Secondary | ICD-10-CM | POA: Diagnosis present

## 2015-10-08 DIAGNOSIS — D491 Neoplasm of unspecified behavior of respiratory system: Secondary | ICD-10-CM

## 2015-10-08 DIAGNOSIS — C3491 Malignant neoplasm of unspecified part of right bronchus or lung: Secondary | ICD-10-CM

## 2015-10-08 DIAGNOSIS — C7931 Secondary malignant neoplasm of brain: Secondary | ICD-10-CM

## 2015-10-08 LAB — CBC WITH DIFFERENTIAL/PLATELET
BASO%: 0.7 % (ref 0.0–2.0)
Basophils Absolute: 0.1 10*3/uL (ref 0.0–0.1)
EOS%: 0.1 % (ref 0.0–7.0)
Eosinophils Absolute: 0 10*3/uL (ref 0.0–0.5)
HEMATOCRIT: 31 % — AB (ref 38.4–49.9)
HEMOGLOBIN: 9.8 g/dL — AB (ref 13.0–17.1)
LYMPH#: 2 10*3/uL (ref 0.9–3.3)
LYMPH%: 17.1 % (ref 14.0–49.0)
MCH: 31 pg (ref 27.2–33.4)
MCHC: 31.7 g/dL — AB (ref 32.0–36.0)
MCV: 97.8 fL (ref 79.3–98.0)
MONO#: 0.9 10*3/uL (ref 0.1–0.9)
MONO%: 7.5 % (ref 0.0–14.0)
NEUT%: 74.6 % (ref 39.0–75.0)
NEUTROS ABS: 8.6 10*3/uL — AB (ref 1.5–6.5)
PLATELETS: 194 10*3/uL (ref 140–400)
RBC: 3.17 10*6/uL — ABNORMAL LOW (ref 4.20–5.82)
RDW: 16.3 % — AB (ref 11.0–14.6)
WBC: 11.5 10*3/uL — ABNORMAL HIGH (ref 4.0–10.3)

## 2015-10-08 LAB — COMPREHENSIVE METABOLIC PANEL
ALBUMIN: 2.9 g/dL — AB (ref 3.5–5.0)
ANION GAP: 9 meq/L (ref 3–11)
AST: 18 U/L (ref 5–34)
Alkaline Phosphatase: 87 U/L (ref 40–150)
BUN: 6.1 mg/dL — AB (ref 7.0–26.0)
CALCIUM: 8.9 mg/dL (ref 8.4–10.4)
CO2: 34 mEq/L — ABNORMAL HIGH (ref 22–29)
CREATININE: 1 mg/dL (ref 0.7–1.3)
Chloride: 97 mEq/L — ABNORMAL LOW (ref 98–109)
EGFR: 76 mL/min/{1.73_m2} — ABNORMAL LOW (ref >90)
Glucose: 91 mg/dl (ref 70–140)
Potassium: 3.5 mEq/L (ref 3.5–5.1)
Sodium: 140 mEq/L (ref 136–145)
TOTAL PROTEIN: 6.1 g/dL — AB (ref 6.4–8.3)

## 2015-10-08 MED ORDER — VITAMIN E 1000 UNITS PO CAPS: 1000.0000 [IU] | ORAL_CAPSULE | Freq: Every day | ORAL | Status: DC

## 2015-10-08 MED ORDER — PENTOXIFYLLINE ER 400 MG PO TBCR
400.0000 mg | EXTENDED_RELEASE_TABLET | Freq: Three times a day (TID) | ORAL | Status: DC
Start: 2015-10-08 — End: 2015-12-25

## 2015-10-08 NOTE — Progress Notes (Signed)
Radiation Oncology         [phone] ________________________________  Name: [name] MRN: [id number]  Date: 10/08/2015  DOB: 02-24-1941  Follow-Up Visit Note  CC: Darren Chestnut, MD  Diagnosis: Metastatic Lung Cancer  No diagnosis found.  Interval Since Last Radiation: 2 months.  07/18/15: PTV6 Inf Left Frontal 61m target was treated using 4 Arcs to a prescription dose of 20 Gy. ExacTrac Snap verification was performed for each couch angle.  05/28/2015 through 06/20/2015:  The patient was treated to the right neck in a palliative manner to a dose of 35 gray in 14 fractions at 2.5 gray per fraction. This was treated using a 2 field, 3-D conformal technique. The patient required a re-simulation during his treatment.  03/12/2015: 1.  PTV3 Rt Cerebellum 116mtarget was treated using 3 Arcs to a prescription dose of 20 Gy. ExacTrac Snap verification was performed for each couch angle.  2.  PTV4 Rt Frontal 8m31marget was treated using 3 Arcs to a prescription dose of 20 Gy. ExacTrac Snap verification was performed for each couch angle. 3.  PTV5 Lt Parietal 2mm64mrget was treated using 2 Arcs to a prescription dose of 20 Gy. ExacTrac Snap verification was performed for each couch angle.  12/04/14: PTV2 Lt frontal 3mm 88mget was treated using 2 Arcs to a prescription dose of 20 Gy. ExacTrac Snap verification was performed for each couch angle.   09/25/2014: The solitary 2 mm right parietal lesion was treated using a course of radiosurgery to a dose of 20 gray in 1 fractions. This consisted of 3 arcs.  Narrative:  The patient returns today for routine follow-up. He saw Dr. MohamJulien Nordmann/22/17 and is undergoing systemic chemotherapy with Docetaxel and Cyramza, status post 8 cycles. CT angiogram of the chest on 07/29/15 showed no evidence of recurrent malignancy. MRI of the brain on 10/06/15 showed mixed response to treatment with the right parietal lesion increasing in size  to 11 x 13 x 12 mm from 9mm a58mthe lesion in the left frontal lobe increasing to 5 mm, previously 4 mm. The lesions in the right cerebellum and anterior left frontal lobe have decreased to 4 mm. No new lesions were identified.  On review of systems the patient reports that he continues to have some difficulty with appetite as a result of his chemotherapy causing him metallic poor sense of taste. He denies any dysphagia. He is not experiencing any chest pain or acute shortness of breath but states he is experiencing a productive cough and continues to notice this. His difficulty breathing is noted with exertion,  and he requires 1 L of oxygen via nasal cannula. He feels as though his shortness of breath is essentially stable during the day his last visit, but on occasion, he feels that this worsens.he is planning to undergo a CT scan of the chest on Friday with Dr. MohameJulien Nordmannenies any abdominal pain, nausea, vomiting. Complete review of systems is obtained and is otherwise negative.   ALLERGIES:  has No Known Allergies.  Meds: Current Outpatient Prescriptions  Medication Sig Dispense Refill  . amLODipine (NORVASC) 5 MG tablet Take 1 tablet (5 mg total) by mouth daily. May take an additional tablet as needed for elevated blood pressure. 45 tablet 11  . aspirin EC 81 MG tablet Take 81 mg by mouth daily.     . clonazePAM (KLONOPIN) 0.5 MG tablet Take 0.5 mg by mouth 2 (two) times daily as  needed for anxiety. Takes one tablet by mouth two times daily as needed for anxiety.    Marland Kitchen dexamethasone (DECADRON) 4 MG tablet 2 tablets by mouth twice a day the day before, day of and day after the chemotherapy every 3 weeks. (Patient not taking: Reported on 09/25/2015) 30 tablet 1  . doxycycline (VIBRA-TABS) 100 MG tablet Take 1 tablet (100 mg total) by mouth 2 (two) times daily. 20 tablet 0  . furosemide (LASIX) 20 MG tablet Change to Lasix 20 mg BID x 7 days; then return to 20 mg Q AM. (Patient not taking: Reported  on 09/24/2015) 45 tablet 0  . guaiFENesin (MUCINEX) 600 MG 12 hr tablet Take 1,200 mg by mouth 2 (two) times daily. Reported on 06/30/2015    . HYDROcodone-acetaminophen (NORCO) 7.5-325 MG tablet Take 1 tablet by mouth every 6 (six) hours as needed for moderate pain. 45 tablet 0  . ipratropium-albuterol (DUONEB) 0.5-2.5 (3) MG/3ML SOLN Take 3 mLs by nebulization 4 (four) times daily. (Patient taking differently: Take 3 mLs by nebulization 4 (four) times daily. Pt takes nebulizer 2 to 3 times daily.) 360 mL 2  . isosorbide mononitrate (IMDUR) 30 MG 24 hr tablet Take 1 tablet (30 mg total) by mouth daily. 30 tablet 4  . lisinopril (PRINIVIL,ZESTRIL) 40 MG tablet Take 40 mg by mouth daily.    . metoprolol tartrate (LOPRESSOR) 25 MG tablet Take 25 mg by mouth 2 (two) times daily.     Marland Kitchen omeprazole (PRILOSEC) 40 MG capsule Take 40 mg by mouth daily.    . pramipexole (MIRAPEX) 1 MG tablet Take 1 mg by mouth at bedtime.    . prochlorperazine (COMPAZINE) 10 MG tablet Take 10 mg by mouth every 6 (six) hours as needed for nausea or vomiting. Reported on 09/25/2015    . traMADol (ULTRAM) 50 MG tablet Take 1 tablet (50 mg total) by mouth every 12 (twelve) hours as needed. (Patient taking differently: Take 50 mg by mouth every 12 (twelve) hours as needed (PAIN). ) 30 tablet 0  . zolpidem (AMBIEN) 10 MG tablet Take 10 mg by mouth at bedtime as needed for sleep. Reported on 09/25/2015     No current facility-administered medications for this encounter.   Facility-Administered Medications Ordered in Other Encounters  Medication Dose Route Frequency Provider Last Rate Last Dose  . sodium chloride 0.9 % injection 10 mL  10 mL Intracatheter PRN Curt Bears, MD   10 mL at 01/29/15 0944    Physical Findings:  height is '5\' 9"'$  (1.753 m) and weight is 171 lb 12.8 oz (77.928 kg). His oral temperature is 98.2 F (36.8 C). His blood pressure is 114/48 and his pulse is 67. His respiration is 22 and oxygen saturation is  96%.    pain scale 0/10 In general this is a chronically ill-appearing Caucasian male in no acute distress. He is alert and oriented x4 and appropriate throughout the examination. HEENT reveals that the patient is normocephalic, atraumatic. EOMs are intact. PERRLA. His meters are inspected bilaterally and skin breakdown is noted of the septum bilaterally near Kiesselbach's plexus, with an eschar on the left that is about 20m. Skin is intact without any evidence of gross lesions. Cardiopulmonary assessment is negative for acute distress and he exhibits normal effort.     Lab Findings: Lab Results  Component Value Date   WBC 11.5* 10/08/2015   WBC 14.5* 06/03/2015   HGB 9.8* 10/08/2015   HGB 11.3* 06/03/2015   HCT 31.0* 10/08/2015  HCT 36.0* 06/03/2015   PLT 194 10/08/2015   PLT 232 06/03/2015    Lab Results  Component Value Date   NA 141 10/01/2015   NA 138 12/05/2014   K 3.9 10/01/2015   K 3.4* 12/05/2014   CHLORIDE 105 10/01/2015   CO2 29 10/01/2015   CO2 32 12/05/2014   GLUCOSE 96 10/01/2015   GLUCOSE 97 12/05/2014   GLUCOSE 117* 12/28/2012   BUN 15.5 10/01/2015   BUN 9 12/05/2014   CREATININE 0.9 10/01/2015   CREATININE 0.52* 12/05/2014   BILITOT <0.30 10/01/2015   BILITOT 0.2* 12/05/2014   ALKPHOS 90 10/01/2015   ALKPHOS 71 12/05/2014   AST 15 10/01/2015   AST 24 12/05/2014   ALT 9 10/01/2015   ALT 30 12/05/2014   PROT 6.0* 10/01/2015   PROT 6.6 12/05/2014   ALBUMIN 3.0* 10/01/2015   ALBUMIN 3.5 12/05/2014   CALCIUM 8.8 10/01/2015   CALCIUM 9.2 12/05/2014   ANIONGAP 7 10/01/2015   ANIONGAP 11 12/05/2014    Radiographic Findings: Mr Jeri Cos Wo Contrast  10/06/2015  CLINICAL DATA:  Lung cancer with brain metastases. Status post stereotactic radiosurgery. EXAM: MRI HEAD WITHOUT AND WITH CONTRAST TECHNIQUE: Multiplanar, multiecho pulse sequences of the brain and surrounding structures were obtained without and with intravenous contrast. CONTRAST:  77m  MULTIHANCE GADOBENATE DIMEGLUMINE 529 MG/ML IV SOLN COMPARISON:  MRI brain 06/25/2015. FINDINGS: A peripherally enhancing lesion within the right parietal lobe continues to increase in size. This lesion now measures 11 x 13 x 12 mm The lesion in the high left frontal lobe on image 112 is slightly larger than on the prior study. This lesion now measures 5.3 mm. A 4 mm lesion in the right cerebellum on image 27 and a 4 mm anterior left frontal lobe lesion on image 91 at both decreased in size. The study is moderately degraded by patient motion. This limits detection of small lesions. No new lesions are present. Restricted diffusion is associated with the posterior right parietal lesion. A punctate area of restricted diffusion previously noted in the right frontal lobe is not present on today's study. Mild generalized atrophy and white matter disease is present otherwise. Flow is present in the major intracranial arteries. A right lens replacement is noted. The globes orbits are otherwise intact. Mild mucosal thickening is present in the maxillary sinuses bilaterally. There is some mucosal thickening in the ethmoid air cells. A fluid level is present in the right sphenoid sinus. Mastoid effusions are more prominent left than right. No obstructing nasopharyngeal lesion is evident. The skullbase is within normal limits. Midline sagittal images demonstrate mild degenerative changes in the upper cervical spine. No significant intracranial lesions are present. IMPRESSION: 1. Mixed response to therapy with continued interval increase in size of a high right parietal lesion now measuring up to 13 mm. 2. Slight increase in size of a left frontal lobe lesion, now measuring 5 mm. 3. 4 mm lesions in the right cerebellum and anterior left frontal lobe have decreased in size since the prior study. 4. No new lesions are present. Of note, the study is severely degraded by patient motion, limiting detection of small lesions.  Electronically Signed   By: CSan MorelleM.D.   On: 10/06/2015 09:30    Impression/Plan: 1.  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 The patient appears to be doing well since completing his radiotherapy. 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 They have been counseled to notify us of any progressive neurologic symptoms that have been given examples of what this could include but not be limited to. The patient will continue to follow with [name] and has plans to undergo additional CT imaging of the chest this Friday.  2. Skin irritation in bilateral narrows due to chronic oxygen use the nasal cannula. I have suggested that the patient consider using oceans nasal spray or another over-the-counter saline spray at least 4 times a day when necessary, and using Aquaphor to his nares bilaterally the same frequency, the patient does have a different type of mass that can be used, and he will also consider using this exclusively until his skin has improved.      Carola Rhine, PAC  This document serves as a record of services personally performed by Shona Simpson, PAC. It was created on her behalf by Darcus Austin, a trained medical scribe. The creation of this record is based on the scribe's personal observations and the provider's statements to them. This document has been checked and approved by the attending provider.

## 2015-10-08 NOTE — Progress Notes (Signed)
Follow up here for MRI results 10/06/15,  On oxygen 1 liter n/c,  Congested productive cough, feels poorly,  Had  Chemotherapy 10/01/15, , Ct chest scheduled is 10/10/15 follow up with [name] 10/15/15 labs and chemo if labs okay,  Has poor taste, eating only 25-30 % meals, 1/2 chicken leg last t night, 1/2 hamburger, drinks pepsis, and eats ice cream,  No difficulty swallowing foods, feels too full when eating small amounts, sob  Hard to breathe, sleeps poorly also   10:05 AM  BP 114/48 mmHg  Pulse 67  Temp(Src) 98.2 F (36.8 C) (Oral)  Resp 22  Ht '5\' 9"'$  (1.753 m)  Wt 171 lb 12.8 oz (77.928 kg)  BMI 25.36 kg/m2  SpO2 96%  Wt Readings from Last 3 Encounters:  10/08/15 171 lb 12.8 oz (77.928 kg)  10/06/15 172 lb (78.019 kg)  09/25/15 178 lb 12.8 oz (81.103 kg)

## 2015-10-10 ENCOUNTER — Encounter (HOSPITAL_COMMUNITY): Payer: Self-pay

## 2015-10-10 ENCOUNTER — Ambulatory Visit (HOSPITAL_COMMUNITY)
Admission: RE | Admit: 2015-10-10 | Discharge: 2015-10-10 | Disposition: A | Discharge status: Home / Self Care | Payer: Medicare Other | Source: Ambulatory Visit | Attending: Nurse Practitioner | Admitting: Nurse Practitioner

## 2015-10-10 DIAGNOSIS — J439 Emphysema, unspecified: Secondary | ICD-10-CM | POA: Diagnosis not present

## 2015-10-10 DIAGNOSIS — Z902 Acquired absence of lung [part of]: Secondary | ICD-10-CM | POA: Diagnosis not present

## 2015-10-10 DIAGNOSIS — J90 Pleural effusion, not elsewhere classified: Secondary | ICD-10-CM | POA: Diagnosis not present

## 2015-10-10 DIAGNOSIS — I700 Atherosclerosis of aorta: Secondary | ICD-10-CM | POA: Diagnosis not present

## 2015-10-10 DIAGNOSIS — C3491 Malignant neoplasm of unspecified part of right bronchus or lung: Secondary | ICD-10-CM | POA: Insufficient documentation

## 2015-10-10 HISTORY — DX: Secondary malignant neoplasm of brain: C79.31

## 2015-10-10 MED ORDER — IOPAMIDOL (ISOVUE-300) INJECTION 61%
100.0000 mL | Freq: Once | INTRAVENOUS | Status: AC | PRN
  Administered 2015-10-10: 100 mL via INTRAVENOUS

## 2015-10-15 ENCOUNTER — Ambulatory Visit (HOSPITAL_BASED_OUTPATIENT_CLINIC_OR_DEPARTMENT_OTHER): Payer: Medicare Other

## 2015-10-15 ENCOUNTER — Other Ambulatory Visit (HOSPITAL_BASED_OUTPATIENT_CLINIC_OR_DEPARTMENT_OTHER): Payer: Medicare Other

## 2015-10-15 ENCOUNTER — Telehealth: Payer: Self-pay | Admitting: Internal Medicine

## 2015-10-15 ENCOUNTER — Encounter: Payer: Self-pay | Admitting: Internal Medicine

## 2015-10-15 ENCOUNTER — Ambulatory Visit (HOSPITAL_BASED_OUTPATIENT_CLINIC_OR_DEPARTMENT_OTHER): Payer: Medicare Other | Admitting: Internal Medicine

## 2015-10-15 VITALS — BP 143/62 | HR 51 | Temp 97.8°F | Resp 18 | Ht 69.0 in | Wt 172.3 lb

## 2015-10-15 DIAGNOSIS — C3491 Malignant neoplasm of unspecified part of right bronchus or lung: Secondary | ICD-10-CM

## 2015-10-15 DIAGNOSIS — Z5112 Encounter for antineoplastic immunotherapy: Secondary | ICD-10-CM

## 2015-10-15 DIAGNOSIS — D491 Neoplasm of unspecified behavior of respiratory system: Secondary | ICD-10-CM

## 2015-10-15 DIAGNOSIS — C3401 Malignant neoplasm of right main bronchus: Secondary | ICD-10-CM

## 2015-10-15 DIAGNOSIS — C7931 Secondary malignant neoplasm of brain: Secondary | ICD-10-CM

## 2015-10-15 DIAGNOSIS — G620 Drug-induced polyneuropathy: Secondary | ICD-10-CM | POA: Diagnosis not present

## 2015-10-15 DIAGNOSIS — C3411 Malignant neoplasm of upper lobe, right bronchus or lung: Secondary | ICD-10-CM

## 2015-10-15 DIAGNOSIS — Z5111 Encounter for antineoplastic chemotherapy: Secondary | ICD-10-CM

## 2015-10-15 LAB — CBC WITH DIFFERENTIAL/PLATELET
BASO%: 1.3 % (ref 0.0–2.0)
Basophils Absolute: 0.1 10*3/uL (ref 0.0–0.1)
EOS%: 0.1 % (ref 0.0–7.0)
Eosinophils Absolute: 0 10*3/uL (ref 0.0–0.5)
HCT: 31.1 % — ABNORMAL LOW (ref 38.4–49.9)
HEMOGLOBIN: 9.9 g/dL — AB (ref 13.0–17.1)
LYMPH%: 28.7 % (ref 14.0–49.0)
MCH: 30.9 pg (ref 27.2–33.4)
MCHC: 31.8 g/dL — ABNORMAL LOW (ref 32.0–36.0)
MCV: 97.3 fL (ref 79.3–98.0)
MONO#: 0.7 10*3/uL (ref 0.1–0.9)
MONO%: 13.7 % (ref 0.0–14.0)
NEUT%: 56.2 % (ref 39.0–75.0)
NEUTROS ABS: 3.1 10*3/uL (ref 1.5–6.5)
Platelets: 189 10*3/uL (ref 140–400)
RBC: 3.2 10*6/uL — AB (ref 4.20–5.82)
RDW: 16.9 % — AB (ref 11.0–14.6)
WBC: 5.5 10*3/uL (ref 4.0–10.3)
lymph#: 1.6 10*3/uL (ref 0.9–3.3)

## 2015-10-15 LAB — COMPREHENSIVE METABOLIC PANEL
AST: 16 U/L (ref 5–34)
Albumin: 3 g/dL — ABNORMAL LOW (ref 3.5–5.0)
Alkaline Phosphatase: 58 U/L (ref 40–150)
Anion Gap: 7 mEq/L (ref 3–11)
BUN: 10.8 mg/dL (ref 7.0–26.0)
CO2: 32 meq/L — AB (ref 22–29)
CREATININE: 1 mg/dL (ref 0.7–1.3)
Calcium: 9 mg/dL (ref 8.4–10.4)
Chloride: 96 mEq/L — ABNORMAL LOW (ref 98–109)
EGFR: 77 mL/min/{1.73_m2} — AB (ref >90)
GLUCOSE: 104 mg/dL (ref 70–140)
Potassium: 4 mEq/L (ref 3.5–5.1)
SODIUM: 135 meq/L — AB (ref 136–145)
TOTAL PROTEIN: 6.2 g/dL — AB (ref 6.4–8.3)

## 2015-10-15 LAB — UA PROTEIN, DIPSTICK - CHCC

## 2015-10-15 MED ORDER — DIPHENHYDRAMINE HCL 50 MG/ML IJ SOLN
INTRAMUSCULAR | Status: AC
  Filled 2015-10-15: qty 1

## 2015-10-15 MED ORDER — RAMUCIRUMAB CHEMO INJECTION 500 MG/50ML
10.0000 mg/kg | Freq: Once | INTRAVENOUS | Status: AC
  Administered 2015-10-15: 800 mg via INTRAVENOUS
  Filled 2015-10-15: qty 60

## 2015-10-15 MED ORDER — ACETAMINOPHEN 325 MG PO TABS
650.0000 mg | ORAL_TABLET | Freq: Once | ORAL | Status: AC
  Administered 2015-10-15: 650 mg via ORAL

## 2015-10-15 MED ORDER — SODIUM CHLORIDE 0.9 % IV SOLN
Freq: Once | INTRAVENOUS | Status: AC
  Administered 2015-10-15: 11:00:00 via INTRAVENOUS
  Filled 2015-10-15: qty 4

## 2015-10-15 MED ORDER — HEPARIN SOD (PORK) LOCK FLUSH 100 UNIT/ML IV SOLN
500.0000 [IU] | Freq: Once | INTRAVENOUS | Status: AC | PRN
  Administered 2015-10-15: 500 [IU]
  Filled 2015-10-15: qty 5

## 2015-10-15 MED ORDER — DIPHENHYDRAMINE HCL 25 MG PO CAPS
ORAL_CAPSULE | ORAL | Status: AC
  Filled 2015-10-15: qty 2

## 2015-10-15 MED ORDER — SODIUM CHLORIDE 0.9 % IJ SOLN
10.0000 mL | INTRAMUSCULAR | Status: DC | PRN
  Administered 2015-10-15: 10 mL
  Filled 2015-10-15: qty 10

## 2015-10-15 MED ORDER — DIPHENHYDRAMINE HCL 50 MG/ML IJ SOLN
50.0000 mg | Freq: Once | INTRAMUSCULAR | Status: AC
  Administered 2015-10-15: 50 mg via INTRAVENOUS

## 2015-10-15 MED ORDER — ACETAMINOPHEN 325 MG PO TABS
ORAL_TABLET | ORAL | Status: AC
  Filled 2015-10-15: qty 2

## 2015-10-15 MED ORDER — SODIUM CHLORIDE 0.9 % IV SOLN
Freq: Once | INTRAVENOUS | Status: AC
  Administered 2015-10-15: 11:00:00 via INTRAVENOUS

## 2015-10-15 NOTE — Patient Instructions (Signed)
[facility] Discharge Instructions for Patients Receiving Chemotherapy  Today you received the following chemotherapy agents Cyramza  To help prevent nausea and vomiting after your treatment, we encourage you to take your nausea medication as prescribed.   If you develop nausea and vomiting that is not controlled by your nausea medication, call the clinic.   BELOW ARE SYMPTOMS THAT SHOULD BE REPORTED IMMEDIATELY:  *FEVER GREATER THAN 100.5 F  *CHILLS WITH OR WITHOUT FEVER  NAUSEA AND VOMITING THAT IS NOT CONTROLLED WITH YOUR NAUSEA MEDICATION  *UNUSUAL SHORTNESS OF BREATH  *UNUSUAL BRUISING OR BLEEDING  TENDERNESS IN MOUTH AND THROAT WITH OR WITHOUT PRESENCE OF ULCERS  *URINARY PROBLEMS  *BOWEL PROBLEMS  UNUSUAL RASH Items with * indicate a potential emergency and should be followed up as soon as possible.  Feel free to call the clinic you have any questions or concerns. The clinic phone number is [phone].  Please show the [facility] at check-in to the Emergency Department and triage nurse.

## 2015-10-15 NOTE — Progress Notes (Signed)
Darren [phone]   [phone]  OFFICE PROGRESS NOTE  Darren Shelter, PA-C [place] [address]  DIAGNOSIS: Metastatic non-small cell lung cancer, squamous cell carcinoma initially diagnosed as Stage IIA (T2b., N0, M0) non-small cell lung cancer, squamous cell carcinoma diagnosed in December of 2013   PRIOR THERAPY:  1) Status post right upper lobectomy under the care of Dr. Prescott Gum on 09/14/2012.  2) Adjuvant chemotherapy with cisplatin 75 mg/M2 and docetaxel 75 mg/M2 with Neulasta support every 3 weeks, status post 1 cycle. Starting with cycle #2, patient will receive carboplatin for an AUC initially of 4.5 and paclitaxel at 175 mg per meter squared with Neulasta support given every 3 weeks. Status post a total of 3 cycles.  3) Systemic chemotherapy with carboplatin for AUC of 5 on day 1 and gemcitabine 1000 MG/M2 on days 1 and 8 every 3 weeks. First dose 09/11/2014. Status post 4 cycles. Discontinued secondary to intolerance 4) Immunotherapy with Nivolumab 3 MG/KG every 2 weeks. Status post 4 cycles. 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  CURRENT THERAPY: Systemic chemotherapy with docetaxel 75 MG/M2 and Cyramza 10 MG/KG every 3 weeks with Neulasta support. Status post 8 cycles. Starting from cycle #9 and the patient would be treated with only Cyramza as a single agent. Docetaxel was discontinued secondary to intolerance.  CHEMOTHERAPY INTENT: Palliative. CURRENT # OF CHEMOTHERAPY CYCLES: 9 CURRENT ANTIEMETICS: Zofran, dexamethasone and Compazine  CURRENT SMOKING STATUS: Quit smoking recently.  ORAL CHEMOTHERAPY AND CONSENT: None  CURRENT BISPHOSPHONATES USE: None  PAIN MANAGEMENT: 3/10 controlled by Norco when necessary  NARCOTICS INDUCED CONSTIPATION: Milk of magnesia on as-needed basis.  LIVING WILL AND CODE STATUS: No CODE BLUE.   INTERVAL HISTORY:  [name]  75 y.o. male returns to the clinic today for followup visit accompanied by his wife and daughter [name]. The patient is currently undergoing systemic chemotherapy with docetaxel and Cyramza status post 8 cycles and has been tolerating his treatment fairly well with no significant complaints except for increasing fatigue and nearly changes from docetaxel. He also continues to have shortness of breath and currently on home oxygen. He denied having any significant chest pain but has mild cough and no hemoptysis. He denied having any significant nausea or vomiting, no fever or chills, no weight loss or night sweats.  He had repeat CT scan of the chest, abdomen and pelvis performed recently and he is here for evaluation and discussion of his scan results. The patient also had MRI of the brain and was seen recently by Dr. Lisbeth Renshaw for discussion of the MRI results.   MEDICAL HISTORY: Past Medical History  Diagnosis Date  . Arthritis   . Wheezing   . Sore throat   . Carotid artery occlusion   . COPD (chronic obstructive pulmonary disease) ([place])   . Lumbar disc disease   . Restless leg syndrome   . Allergic rhinitis   . Hypertension   . Anxiety     anxiety  . Shortness of breath   . Lung mass   . GERD (gastroesophageal reflux disease)   . H/O hiatal hernia   . Pre-operative cardiovascular examination   . Nonspecific abnormal unspecified cardiovascular function study   . Peripheral vascular disease, unspecified ([place])   . Pneumonia 2014    ?   Marland Kitchen On home oxygen therapy     prn  . Constipation   .  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 S/P radiation therapy 12/04/14 srs    lt frontal brain  . Cancer ([place]) 09/14/12    invasive mod diff squamous cell ca  . Metastasis to brain [facility]) 02/2015    ALLERGIES:  has No Known Allergies.  MEDICATIONS:  Current Outpatient Prescriptions  Medication Sig Dispense Refill  . amLODipine (NORVASC) 5 MG tablet Take 1 tablet (5 mg total) by mouth daily.  May take an additional tablet as needed for elevated blood pressure. 45 tablet 11  . aspirin EC 81 MG tablet Take 81 mg by mouth daily.     . clonazePAM (KLONOPIN) 0.5 MG tablet Take 0.5 mg by mouth 2 (two) times daily as needed for anxiety. Takes one tablet by mouth two times daily as needed for anxiety.    Marland Kitchen dexamethasone (DECADRON) 4 MG tablet 2 tablets by mouth twice a day the day before, day of and day after the chemotherapy every 3 weeks. (Patient not taking: Reported on 09/25/2015) 30 tablet 1  . doxycycline (VIBRA-TABS) 100 MG tablet Take 1 tablet (100 mg total) by mouth 2 (two) times daily. 20 tablet 0  . furosemide (LASIX) 20 MG tablet Change to Lasix 20 mg BID x 7 days; then return to 20 mg Q AM. (Patient not taking: Reported on 09/24/2015) 45 tablet 0  . guaiFENesin (MUCINEX) 600 MG 12 hr tablet Take 1,200 mg by mouth 2 (two) times daily. Reported on 06/30/2015    . HYDROcodone-acetaminophen (NORCO) 7.5-325 MG tablet Take 1 tablet by mouth every 6 (six) hours as needed for moderate pain. 45 tablet 0  . ipratropium-albuterol (DUONEB) 0.5-2.5 (3) MG/3ML SOLN Take 3 mLs by nebulization 4 (four) times daily. (Patient taking differently: Take 3 mLs by nebulization 4 (four) times daily. Pt takes nebulizer 2 to 3 times daily.) 360 mL 2  . isosorbide mononitrate (IMDUR) 30 MG 24 hr tablet Take 1 tablet (30 mg total) by mouth daily. 30 tablet 4  . lisinopril (PRINIVIL,ZESTRIL) 40 MG tablet Take 40 mg by mouth daily.    . metoprolol tartrate (LOPRESSOR) 25 MG tablet Take 25 mg by mouth 2 (two) times daily.     Marland Kitchen omeprazole (PRILOSEC) 40 MG capsule Take 40 mg by mouth daily.    . pentoxifylline (TRENTAL) 400 MG CR tablet Take 1 tablet (400 mg total) by mouth 3 (three) times daily. 90 tablet 12  . pramipexole (MIRAPEX) 1 MG tablet Take 1 mg by mouth at bedtime.    . prochlorperazine (COMPAZINE) 10 MG tablet Take 10 mg by mouth every 6 (six) hours as needed for nausea or vomiting. Reported on 09/25/2015     . traMADol (ULTRAM) 50 MG tablet Take 1 tablet (50 mg total) by mouth every 12 (twelve) hours as needed. (Patient taking differently: Take 50 mg by mouth every 12 (twelve) hours as needed (PAIN). ) 30 tablet 0  . vitamin E (VITAMIN E) 1000 UNIT capsule Take 1 capsule (1,000 Units total) by mouth daily. 30 capsule 12  . zolpidem (AMBIEN) 10 MG tablet Take 10 mg by mouth at bedtime as needed for sleep. Reported on 09/25/2015     No current facility-administered medications for this visit.   Facility-Administered Medications Ordered in Other Visits  Medication Dose Route Frequency Provider Last Rate Last Dose  . sodium chloride 0.9 % injection 10 mL  10 mL Intracatheter PRN Curt Bears, MD   10 mL at 01/29/15 0944    SURGICAL  HISTORY:  Past Surgical History  Procedure Laterality Date  . Carotid endarterectomy  10/15/2010    left  . Hand surgery      repair of the left long, ring, and small finger after a saw injury  . Video bronchoscopy  07/24/2012    Procedure: VIDEO BRONCHOSCOPY WITH FLUORO;  Surgeon: Kathee Delton, MD;  Location: [facility];  Service: Cardiopulmonary;  Laterality: Bilateral;  . Lobectomy Right 09/14/2012    Procedure: LOBECTOMY;  Surgeon: Ivin Poot, MD;  Location: [facility];  Service: Thoracic;  Laterality: Right;  RIGHT UPPER LOBECTOMY  . Video assisted thoracoscopy Right 09/14/2012    Procedure: VIDEO ASSISTED THORACOSCOPY;  Surgeon: Ivin Poot, MD;  Location: [facility];  Service: Thoracic;  Laterality: Right;  . Video bronchoscopy with endobronchial ultrasound N/A 08/27/2014    Procedure: VIDEO BRONCHOSCOPY WITH ENDOBRONCHIAL ULTRASOUND;  Surgeon: Ivin Poot, MD;  Location: [facility];  Service: Thoracic;  Laterality: N/A;  . [place] node biopsy Right 08/27/2014    Procedure: BIOPSY SCALENE NODE;  Surgeon: Ivin Poot, MD;  Location: [facility];  Service: Thoracic;  Laterality: Right;  . Sterotactic radiosurgery Right 09/25/14    right parietal 30Gy/1 fx    REVIEW  OF SYSTEMS:  Constitutional: positive for fatigue Eyes: negative Ears, nose, mouth, throat, and face: negative Respiratory: positive for cough and dyspnea on exertion Cardiovascular: negative Gastrointestinal: negative Genitourinary:negative Integument/breast: negative Hematologic/lymphatic: negative Musculoskeletal:negative Neurological: negative Behavioral/Psych: negative Endocrine: negative Allergic/Immunologic: negative   PHYSICAL EXAMINATION: General appearance: alert, cooperative and no distress Head: Normocephalic, without obvious abnormality, atraumatic Neck: no adenopathy, no JVD, supple, symmetrical, trachea midline and thyroid not enlarged, symmetric, no tenderness/mass/nodules Lymph nodes: Cervical, supraclavicular, and axillary nodes normal. Resp: wheezes bilaterally Back: symmetric, no curvature. ROM normal. No CVA tenderness. Cardio: regular rate and rhythm, S1, S2 normal, no murmur, click, rub or gallop GI: soft, non-tender; bowel sounds normal; no masses,  no organomegaly Extremities: extremities normal, atraumatic, no cyanosis or edema Neurologic: Alert and oriented X 3, normal strength and tone. Normal symmetric reflexes. Normal coordination and gait  ECOG PERFORMANCE STATUS: 1 - Symptomatic but completely ambulatory  Blood pressure 143/62, pulse 51, temperature 97.8 F (36.6 C), temperature source Oral, resp. rate 18, height '5\' 9"'$  (1.753 m), weight 172 lb 4.8 oz (78.155 kg), SpO2 92 %.  LABORATORY DATA: Lab Results  Component Value Date   WBC 5.5 10/15/2015   HGB 9.9* 10/15/2015   HCT 31.1* 10/15/2015   MCV 97.3 10/15/2015   PLT 189 10/15/2015      Chemistry      Component Value Date/Time   NA 140 10/08/2015 0927   NA 138 12/05/2014 0618   K 3.5 10/08/2015 0927   K 3.4* 12/05/2014 0618   CL 95* 12/05/2014 0618   CL 101 12/28/2012 0852   CO2 34* 10/08/2015 0927   CO2 32 12/05/2014 0618   BUN 6.1* 10/08/2015 0927   BUN 9 12/05/2014 0618    CREATININE 1.0 10/08/2015 0927   CREATININE 0.52* 12/05/2014 0618      Component Value Date/Time   CALCIUM 8.9 10/08/2015 0927   CALCIUM 9.2 12/05/2014 0618   ALKPHOS 87 10/08/2015 0927   ALKPHOS 71 12/05/2014 0618   AST 18 10/08/2015 0927   AST 24 12/05/2014 0618   ALT <9 10/08/2015 0927   ALT 30 12/05/2014 0618   BILITOT <0.30 10/08/2015 0927   BILITOT 0.2* 12/05/2014 0618       RADIOGRAPHIC STUDIES: Ct Chest W  Contrast  10/10/2015  CLINICAL DATA:  75 year old male with history of lung cancer with metastatic disease to the brain. Ongoing chemotherapy. Status post radiation therapy to the brain completed approximately 2 and a half months ago. Cough and congestion. 5 pound weight loss. Shortness breath. On home oxygen. EXAM: CT CHEST, ABDOMEN, AND PELVIS WITH CONTRAST TECHNIQUE: Multidetector CT imaging of the chest, abdomen and pelvis was performed following the standard protocol during bolus administration of intravenous contrast. CONTRAST:  164m ISOVUE-300 IOPAMIDOL (ISOVUE-300) INJECTION 61% COMPARISON:  Chest CT 07/29/2015. CT the abdomen and pelvis 06/04/2015. FINDINGS: CT CHEST FINDINGS Mediastinum/Lymph Nodes: Heart size is normal. There is no significant pericardial fluid, thickening or pericardial calcification. There is atherosclerosis of the thoracic aorta, the great vessels of the mediastinum and the coronary arteries, including calcified atherosclerotic plaque in the left main, left anterior descending, left circumflex and right coronary arteries. Thickening calcification of the aortic valve. No pathologically enlarged mediastinal or hilar lymph nodes. Esophagus is unremarkable in appearance. No axillary lymphadenopathy. Right internal jugular single-lumen porta cath with tip terminating in the distal superior vena cava. Lungs/Pleura: Status post right upper lobectomy. Compensatory hyperexpansion of the right middle and lower lobes. There continues to be architectural distortion  and thickening of the peribronchovascular interstitium in the perihilar aspect of the right lung, which is similar to prior examinations. None of this thickening in is nodular or mass like to suggest disease recurrence. There is a small chronic right-sided pleural effusion which is unchanged. Several tiny pleural calcifications in the left hemithorax are again noted, with a trace amount of dependent left-sided pleural fluid, also unchanged. No new suspicious appearing pulmonary nodules or masses. No acute consolidative airspace disease. Diffuse bronchial wall thickening with mild centrilobular and paraseptal emphysema. Bilateral apical nodular pleuroparenchymal thickening, similar prior studies, presumably from chronic post infectious or inflammatory scarring. Musculoskeletal/Soft Tissues: Old healed posterior right-sided rib fractures. There are no aggressive appearing lytic or blastic lesions noted in the visualized portions of the skeleton. CT ABDOMEN AND PELVIS FINDINGS Hepatobiliary: No cystic or solid hepatic lesions. No intra or extrahepatic biliary ductal dilatation. Gallbladder is normal in appearance. Pancreas: No pancreatic mass. No pancreatic ductal dilatation. No pancreatic or peripancreatic fluid or inflammatory changes. Spleen: Small calcified granuloma in the medial aspect of the spleen. Otherwise, unremarkable. Adrenals/Urinary Tract: Bilateral kidneys and bilateral adrenal glands are normal in appearance. No hydroureteronephrosis. Urinary bladder is normal in appearance. Stomach/Bowel: Normal appearance of the stomach. No pathologic dilatation of small bowel or colon. Normal appendix. Vascular/Lymphatic: Extensive atherosclerosis throughout the abdominal and pelvic vasculature, with fusiform ectasia of the infrarenal abdominal aorta which measures up to 2.7 cm in diameter shortly before the aortic bifurcation. Probable high-grade stenosis of the distal left common femoral artery where there is a  large calcified plaque which nearly completely obliterates the lumen. No lymphadenopathy noted in the abdomen or pelvis. Reproductive: Prostate gland and seminal vesicles are unremarkable in appearance. Other: No significant volume of ascites.  No pneumoperitoneum. Musculoskeletal: There are no aggressive appearing lytic or blastic lesions noted in the visualized portions of the skeleton. IMPRESSION: 1. Stable postoperative findings of right upper lobectomy. No findings to suggest local recurrence of disease or metastatic disease in the chest, abdomen or pelvis. 2. Chronic small right pleural effusion is unchanged compared to the prior examination. Tiny calcified left-sided pleural plaques and trace left pleural effusion is also unchanged. 3. Mild diffuse bronchial wall thickening with mild centrilobular and paraseptal emphysema; imaging findings suggestive of underlying COPD.  4. Atherosclerosis, including left main and 3 vessel coronary artery disease. In addition, there is ectasia of the infrarenal abdominal aorta, and likely high-grade stenosis of the distal left common femoral artery. Assessment for potential risk factor modification, dietary therapy or pharmacologic therapy may be warranted, if clinically indicated. 5. There are calcifications of the aortic valve. Echocardiographic correlation for evaluation of potential valvular dysfunction may be warranted if clinically indicated. 6. Additional incidental findings, as above. Electronically Signed   By: Vinnie Langton M.D.   On: 10/10/2015 08:55   Mr Jeri Cos UD Contrast  10/06/2015  CLINICAL DATA:  Lung cancer with brain metastases. Status post stereotactic radiosurgery. EXAM: MRI HEAD WITHOUT AND WITH CONTRAST TECHNIQUE: Multiplanar, multiecho pulse sequences of the brain and surrounding structures were obtained without and with intravenous contrast. CONTRAST:  79m MULTIHANCE GADOBENATE DIMEGLUMINE 529 MG/ML IV SOLN COMPARISON:  MRI brain 06/25/2015.  FINDINGS: A peripherally enhancing lesion within the right parietal lobe continues to increase in size. This lesion now measures 11 x 13 x 12 mm The lesion in the high left frontal lobe on image 112 is slightly larger than on the prior study. This lesion now measures 5.3 mm. A 4 mm lesion in the right cerebellum on image 27 and a 4 mm anterior left frontal lobe lesion on image 91 at both decreased in size. The study is moderately degraded by patient motion. This limits detection of small lesions. No new lesions are present. Restricted diffusion is associated with the posterior right parietal lesion. A punctate area of restricted diffusion previously noted in the right frontal lobe is not present on today's study. Mild generalized atrophy and white matter disease is present otherwise. Flow is present in the major intracranial arteries. A right lens replacement is noted. The globes orbits are otherwise intact. Mild mucosal thickening is present in the maxillary sinuses bilaterally. There is some mucosal thickening in the ethmoid air cells. A fluid level is present in the right sphenoid sinus. Mastoid effusions are more prominent left than right. No obstructing nasopharyngeal lesion is evident. The skullbase is within normal limits. Midline sagittal images demonstrate mild degenerative changes in the upper cervical spine. No significant intracranial lesions are present. IMPRESSION: 1. Mixed response to therapy with continued interval increase in size of a high right parietal lesion now measuring up to 13 mm. 2. Slight increase in size of a left frontal lobe lesion, now measuring 5 mm. 3. 4 mm lesions in the right cerebellum and anterior left frontal lobe have decreased in size since the prior study. 4. No new lesions are present. Of note, the study is severely degraded by patient motion, limiting detection of small lesions. Electronically Signed   By: CSan MorelleM.D.   On: 10/06/2015 09:30   Ct Abdomen  Pelvis W Contrast  10/10/2015  CLINICAL DATA:  75year old male with history of lung cancer with metastatic disease to the brain. Ongoing chemotherapy. Status post radiation therapy to the brain completed approximately 2 and a half months ago. Cough and congestion. 5 pound weight loss. Shortness breath. On home oxygen. EXAM: CT CHEST, ABDOMEN, AND PELVIS WITH CONTRAST TECHNIQUE: Multidetector CT imaging of the chest, abdomen and pelvis was performed following the standard protocol during bolus administration of intravenous contrast. CONTRAST:  1034mISOVUE-300 IOPAMIDOL (ISOVUE-300) INJECTION 61% COMPARISON:  Chest CT 07/29/2015. CT the abdomen and pelvis 06/04/2015. FINDINGS: CT CHEST FINDINGS Mediastinum/Lymph Nodes: Heart size is normal. There is no significant pericardial fluid, thickening or pericardial calcification. There is  atherosclerosis of the thoracic aorta, the great vessels of the mediastinum and the coronary arteries, including calcified atherosclerotic plaque in the left main, left anterior descending, left circumflex and right coronary arteries. Thickening calcification of the aortic valve. No pathologically enlarged mediastinal or hilar lymph nodes. Esophagus is unremarkable in appearance. No axillary lymphadenopathy. Right internal jugular single-lumen porta cath with tip terminating in the distal superior vena cava. Lungs/Pleura: Status post right upper lobectomy. Compensatory hyperexpansion of the right middle and lower lobes. There continues to be architectural distortion and thickening of the peribronchovascular interstitium in the perihilar aspect of the right lung, which is similar to prior examinations. None of this thickening in is nodular or mass like to suggest disease recurrence. There is a small chronic right-sided pleural effusion which is unchanged. Several tiny pleural calcifications in the left hemithorax are again noted, with a trace amount of dependent left-sided pleural fluid,  also unchanged. No new suspicious appearing pulmonary nodules or masses. No acute consolidative airspace disease. Diffuse bronchial wall thickening with mild centrilobular and paraseptal emphysema. Bilateral apical nodular pleuroparenchymal thickening, similar prior studies, presumably from chronic post infectious or inflammatory scarring. Musculoskeletal/Soft Tissues: Old healed posterior right-sided rib fractures. There are no aggressive appearing lytic or blastic lesions noted in the visualized portions of the skeleton. CT ABDOMEN AND PELVIS FINDINGS Hepatobiliary: No cystic or solid hepatic lesions. No intra or extrahepatic biliary ductal dilatation. Gallbladder is normal in appearance. Pancreas: No pancreatic mass. No pancreatic ductal dilatation. No pancreatic or peripancreatic fluid or inflammatory changes. Spleen: Small calcified granuloma in the medial aspect of the spleen. Otherwise, unremarkable. Adrenals/Urinary Tract: Bilateral kidneys and bilateral adrenal glands are normal in appearance. No hydroureteronephrosis. Urinary bladder is normal in appearance. Stomach/Bowel: Normal appearance of the stomach. No pathologic dilatation of small bowel or colon. Normal appendix. Vascular/Lymphatic: Extensive atherosclerosis throughout the abdominal and pelvic vasculature, with fusiform ectasia of the infrarenal abdominal aorta which measures up to 2.7 cm in diameter shortly before the aortic bifurcation. Probable high-grade stenosis of the distal left common femoral artery where there is a large calcified plaque which nearly completely obliterates the lumen. No lymphadenopathy noted in the abdomen or pelvis. Reproductive: Prostate gland and seminal vesicles are unremarkable in appearance. Other: No significant volume of ascites.  No pneumoperitoneum. Musculoskeletal: There are no aggressive appearing lytic or blastic lesions noted in the visualized portions of the skeleton. IMPRESSION: 1. Stable postoperative  findings of right upper lobectomy. No findings to suggest local recurrence of disease or metastatic disease in the chest, abdomen or pelvis. 2. Chronic small right pleural effusion is unchanged compared to the prior examination. Tiny calcified left-sided pleural plaques and trace left pleural effusion is also unchanged. 3. Mild diffuse bronchial wall thickening with mild centrilobular and paraseptal emphysema; imaging findings suggestive of underlying COPD. 4. Atherosclerosis, including left main and 3 vessel coronary artery disease. In addition, there is ectasia of the infrarenal abdominal aorta, and likely high-grade stenosis of the distal left common femoral artery. Assessment for potential risk factor modification, dietary therapy or pharmacologic therapy may be warranted, if clinically indicated. 5. There are calcifications of the aortic valve. Echocardiographic correlation for evaluation of potential valvular dysfunction may be warranted if clinically indicated. 6. Additional incidental findings, as above. Electronically Signed   By: Vinnie Langton M.D.   On: 10/10/2015 08:55    ASSESSMENT AND PLAN: This is a very pleasant 75 years old white male with history of stage IIA non-small cell lung cancer status post right upper  lobectomy followed by 4 cycles of adjuvant chemotherapy.  The patient underwent systemic chemotherapy with carboplatin and gemcitabine status post 4 cycles discontinued secondary to intolerance and mild disease progression.  The patient completed treatment with Nivolumab status post 4 cycles but this was discontinued secondary to disease progression. He also recently completed stereotactic radiotherapy to metastatic brain lesions. The patient is currently undergoing systemic chemotherapy with docetaxel and Cyramza status post 8 cycles and has been tolerating his treatment well except for increasing fatigue and nearly changes as well as mild peripheral neuropathy. The recent CT scan  of the chest, abdomen and pelvis showed no significant evidence for disease progression. I discussed the scan results with the patient and his family. I gave him several options for management of his condition including observation and close monitoring versus continuing treatment with docetaxel and Cyramza versus continuing treatment with single agent Cyramza. The patient was concerned about the adverse effect of docetaxel and he would like to continue treatment with single agent Cyramza at this point. Starting from cycle #9 the patient will be treated with single agent Cyramza. I will see him back for follow-up visit in 3 weeks for reevaluation before starting cycle #10. His lab work will be performed every 3 weeks with treatment. MRI of the brain showed questionable disease progression but the patient is followed closely by Dr. Lisbeth Renshaw. He was advised to call immediately if he has any concerning symptoms in the interval.  The patient voices understanding of current disease status and treatment options and is in agreement with the current care plan.  All questions were answered. The patient knows to call the clinic with any problems, questions or concerns. We can certainly see the patient much sooner if necessary.  Disclaimer: This note was dictated with voice recognition software. Similar sounding words can inadvertently be transcribed and may not be corrected upon review.

## 2015-10-15 NOTE — Telephone Encounter (Signed)
Pt didi not want print out....will see mychart

## 2015-10-16 ENCOUNTER — Ambulatory Visit: Payer: Medicare Other

## 2015-10-22 ENCOUNTER — Other Ambulatory Visit: Payer: Medicare Other

## 2015-10-28 ENCOUNTER — Telehealth: Payer: Self-pay | Admitting: *Deleted

## 2015-10-28 NOTE — Telephone Encounter (Signed)
FYI "I hit my arm above the elbow.  It was bleeding but now there's clear liquid draining from it around my elbow.  Is this normal?  The area is the size of the end of a pencil."  Admits taking daily aspirin 81 mg.  Avised to clean area, apply neosporin or an antibiotic ointment covering with a band aid.  Hold pressure ten minutes should ease the serous fluid drainage.  "It's been draining for an hour.  Enough liquid to cover the bottom of a coffee mug."  Advised he monitor the area to ensure drainage stops with no change in color and wound does not change color.  Verbalized understanding.

## 2015-10-29 ENCOUNTER — Other Ambulatory Visit: Payer: Medicare Other

## 2015-11-05 ENCOUNTER — Ambulatory Visit (HOSPITAL_BASED_OUTPATIENT_CLINIC_OR_DEPARTMENT_OTHER): Payer: Medicare Other | Admitting: Nurse Practitioner

## 2015-11-05 ENCOUNTER — Other Ambulatory Visit (HOSPITAL_BASED_OUTPATIENT_CLINIC_OR_DEPARTMENT_OTHER): Payer: Medicare Other

## 2015-11-05 ENCOUNTER — Ambulatory Visit (HOSPITAL_BASED_OUTPATIENT_CLINIC_OR_DEPARTMENT_OTHER): Payer: No Typology Code available for payment source

## 2015-11-05 VITALS — BP 173/78 | HR 88 | Temp 97.9°F | Resp 17 | Ht 69.0 in | Wt 172.9 lb

## 2015-11-05 VITALS — BP 138/77 | HR 68 | Resp 19

## 2015-11-05 DIAGNOSIS — Z5112 Encounter for antineoplastic immunotherapy: Secondary | ICD-10-CM

## 2015-11-05 DIAGNOSIS — C3411 Malignant neoplasm of upper lobe, right bronchus or lung: Secondary | ICD-10-CM

## 2015-11-05 DIAGNOSIS — C7931 Secondary malignant neoplasm of brain: Secondary | ICD-10-CM

## 2015-11-05 DIAGNOSIS — C3491 Malignant neoplasm of unspecified part of right bronchus or lung: Secondary | ICD-10-CM

## 2015-11-05 DIAGNOSIS — I10 Essential (primary) hypertension: Secondary | ICD-10-CM

## 2015-11-05 DIAGNOSIS — C770 Secondary and unspecified malignant neoplasm of lymph nodes of head, face and neck: Secondary | ICD-10-CM

## 2015-11-05 DIAGNOSIS — L609 Nail disorder, unspecified: Secondary | ICD-10-CM | POA: Diagnosis not present

## 2015-11-05 DIAGNOSIS — D491 Neoplasm of unspecified behavior of respiratory system: Secondary | ICD-10-CM

## 2015-11-05 DIAGNOSIS — C3400 Malignant neoplasm of unspecified main bronchus: Secondary | ICD-10-CM

## 2015-11-05 DIAGNOSIS — C3490 Malignant neoplasm of unspecified part of unspecified bronchus or lung: Secondary | ICD-10-CM

## 2015-11-05 LAB — COMPREHENSIVE METABOLIC PANEL
ANION GAP: 7 meq/L (ref 3–11)
AST: 14 U/L (ref 5–34)
Albumin: 3.2 g/dL — ABNORMAL LOW (ref 3.5–5.0)
Alkaline Phosphatase: 57 U/L (ref 40–150)
BILIRUBIN TOTAL: 0.34 mg/dL (ref 0.20–1.20)
BUN: 6.7 mg/dL — AB (ref 7.0–26.0)
CHLORIDE: 96 meq/L — AB (ref 98–109)
CO2: 30 meq/L — AB (ref 22–29)
CREATININE: 0.8 mg/dL (ref 0.7–1.3)
Calcium: 9.1 mg/dL (ref 8.4–10.4)
EGFR: 88 mL/min/{1.73_m2} — ABNORMAL LOW (ref >90)
Glucose: 104 mg/dl (ref 70–140)
Potassium: 3.8 mEq/L (ref 3.5–5.1)
Sodium: 133 mEq/L — ABNORMAL LOW (ref 136–145)
TOTAL PROTEIN: 6.3 g/dL — AB (ref 6.4–8.3)

## 2015-11-05 LAB — CBC WITH DIFFERENTIAL/PLATELET
BASO%: 0.5 % (ref 0.0–2.0)
Basophils Absolute: 0 10*3/uL (ref 0.0–0.1)
EOS%: 5.3 % (ref 0.0–7.0)
Eosinophils Absolute: 0.2 10*3/uL (ref 0.0–0.5)
HEMATOCRIT: 32.5 % — AB (ref 38.4–49.9)
HGB: 10.4 g/dL — ABNORMAL LOW (ref 13.0–17.1)
LYMPH#: 1.3 10*3/uL (ref 0.9–3.3)
LYMPH%: 34.7 % (ref 14.0–49.0)
MCH: 31.5 pg (ref 27.2–33.4)
MCHC: 32 g/dL (ref 32.0–36.0)
MCV: 98.5 fL — ABNORMAL HIGH (ref 79.3–98.0)
MONO#: 0.4 10*3/uL (ref 0.1–0.9)
MONO%: 11.3 % (ref 0.0–14.0)
NEUT%: 48.2 % (ref 39.0–75.0)
NEUTROS ABS: 1.8 10*3/uL (ref 1.5–6.5)
PLATELETS: 150 10*3/uL (ref 140–400)
RBC: 3.3 10*6/uL — AB (ref 4.20–5.82)
RDW: 15.8 % — ABNORMAL HIGH (ref 11.0–14.6)
WBC: 3.8 10*3/uL — AB (ref 4.0–10.3)

## 2015-11-05 MED ORDER — SODIUM CHLORIDE 0.9 % IV SOLN
Freq: Once | INTRAVENOUS | Status: AC
  Administered 2015-11-05: 11:00:00 via INTRAVENOUS

## 2015-11-05 MED ORDER — ACETAMINOPHEN 325 MG PO TABS
650.0000 mg | ORAL_TABLET | Freq: Once | ORAL | Status: AC
  Administered 2015-11-05: 650 mg via ORAL

## 2015-11-05 MED ORDER — CLONIDINE HCL 0.1 MG PO TABS
0.2000 mg | ORAL_TABLET | Freq: Every day | ORAL | Status: DC
  Administered 2015-11-05: 0.2 mg via ORAL

## 2015-11-05 MED ORDER — ACETAMINOPHEN 325 MG PO TABS
ORAL_TABLET | ORAL | Status: AC
  Filled 2015-11-05: qty 2

## 2015-11-05 MED ORDER — DIPHENHYDRAMINE HCL 50 MG/ML IJ SOLN
INTRAMUSCULAR | Status: AC
  Filled 2015-11-05: qty 1

## 2015-11-05 MED ORDER — DIPHENHYDRAMINE HCL 50 MG/ML IJ SOLN
50.0000 mg | Freq: Once | INTRAMUSCULAR | Status: AC
Start: 2015-11-05 — End: 2015-11-05
  Administered 2015-11-05: 50 mg via INTRAVENOUS

## 2015-11-05 MED ORDER — DEXAMETHASONE SODIUM PHOSPHATE 100 MG/10ML IJ SOLN
Freq: Once | INTRAMUSCULAR | Status: AC
  Administered 2015-11-05: 11:00:00 via INTRAVENOUS
  Filled 2015-11-05: qty 4

## 2015-11-05 MED ORDER — DOXYCYCLINE HYCLATE 100 MG PO TABS: 100.0000 mg | ORAL_TABLET | Freq: Two times a day (BID) | ORAL | Status: DC

## 2015-11-05 MED ORDER — HEPARIN SOD (PORK) LOCK FLUSH 100 UNIT/ML IV SOLN
500.0000 [IU] | Freq: Once | INTRAVENOUS | Status: AC | PRN
  Administered 2015-11-05: 500 [IU]
  Filled 2015-11-05: qty 5

## 2015-11-05 MED ORDER — SODIUM CHLORIDE 0.9 % IV SOLN
10.0000 mg/kg | Freq: Once | INTRAVENOUS | Status: AC
  Administered 2015-11-05: 800 mg via INTRAVENOUS
  Filled 2015-11-05: qty 20

## 2015-11-05 MED ORDER — CLONIDINE HCL 0.1 MG PO TABS
ORAL_TABLET | ORAL | Status: AC
  Filled 2015-11-05: qty 2

## 2015-11-05 MED ORDER — SODIUM CHLORIDE 0.9 % IJ SOLN
10.0000 mL | INTRAMUSCULAR | Status: DC | PRN
  Administered 2015-11-05: 10 mL
  Filled 2015-11-05: qty 10

## 2015-11-05 NOTE — Progress Notes (Signed)
Patient is acutely asymptomatic. He denies headache or any new visual changes. He is alert and oriented, did not take his HTN meds today.  Was evaluated by Lattie Haw NP today and MD [facility] aware and ordered the given [facility].

## 2015-11-05 NOTE — Progress Notes (Signed)
[facility] OFFICE PROGRESS NOTE   DIAGNOSIS: Metastatic non-small cell lung cancer, squamous cell carcinoma initially diagnosed as Stage IIA (T2b., N0, M0) non-small cell lung cancer, squamous cell carcinoma diagnosed in December of 2013   PRIOR THERAPY:  1) Status post right upper lobectomy under the care of Dr. Prescott Gum on 09/14/2012.  2) Adjuvant chemotherapy with cisplatin 75 mg/M2 and docetaxel 75 mg/M2 with Neulasta support every 3 weeks, status post 1 cycle. Starting with cycle #2, patient will receive carboplatin for an AUC initially of 4.5 and paclitaxel at 175 mg per meter squared with Neulasta support given every 3 weeks. Status post a total of 3 cycles.  3) Systemic chemotherapy with carboplatin for AUC of 5 on day 1 and gemcitabine 1000 MG/M2 on days 1 and 8 every 3 weeks. First dose 09/11/2014. Status post 4 cycles. Discontinued secondary to intolerance 4) Immunotherapy with Nivolumab 3 MG/KG every 2 weeks. Status post 4 cycles. 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  CURRENT THERAPY: Systemic chemotherapy with docetaxel 75 MG/M2 and Cyramza 10 MG/KG every 3 weeks with Neulasta support. Status post 8 cycles. Starting from cycle #9 and the patient would be treated with only Cyramza as a single agent. Docetaxel was discontinued secondary to intolerance.   INTERVAL HISTORY:   [name] returns as scheduled. He is currently being treated with ramucirumab alone. He completed cycle 9 10/15/2015. He denies nausea/vomiting. No mouth sores. No diarrhea. He continues to have issues with constipation. Dyspnea is unchanged. He has a chronic cough. No hemoptysis. He has felt more tired for the past 3 weeks. He has periodic nosebleeds. He attributes this to "picking". No other bleeding. He reports continued changes involving the finger nails. He notes drainage from underneath multiple nails and has lost several fingernails.  We  discussed his elevated blood pressure. He has not taken any of his blood pressure medications yet today.  Objective:  Vital signs in last 24 hours:  Blood pressure 173/78, pulse 88, temperature 97.9 F (36.6 C), temperature source Oral, resp. rate 17, height '5\' 9"'$  (1.753 m), weight 172 lb 14.4 oz (78.427 kg), SpO2 97 %.    HEENT: No thrush or ulcers. Resp: Distant breath sounds, scattered wheezes. No respiratory distress. Cardio: Regular rate and rhythm. GI: Abdomen soft and nontender. No hepatomegaly. Vascular: Trace to 1+ pitting edema at the lower legs bilaterally.  Skin: Multiple missing fingernails. Several nails lift. Drainage from beneath multiple nails.  Port-A-Cath without erythema.  Lab Results:  Lab Results  Component Value Date   WBC 3.8* 11/05/2015   HGB 10.4* 11/05/2015   HCT 32.5* 11/05/2015   MCV 98.5* 11/05/2015   PLT 150 11/05/2015   NEUTROABS 1.8 11/05/2015    Imaging:  No results found.  Medications: I have reviewed the patient's current medications.  Assessment/Plan:  1. Metastatic non-small cell lung cancer. Initially diagnosed with stage IIa non-small cell lung cancer December 2013. Status post right upper lobectomy followed by 4 cycles of adjuvant chemotherapy. Found to have recurrent/metastatic disease early 2016 status post carboplatin/gemcitabine 4 cycles discontinued secondary to intolerance; next treated with nivolumab status post 4 cycles discontinued due to progression. Treatment initiated with docetaxel/ramucirumab 04/07/2015. He completed cycle 8 09/25/2015. Restaging CT evaluation 10/10/2015 showed no significant evidence for disease progression. Ramucirumab alone beginning 10/15/2015. 2. Nail changes related to Taxotere. He will complete a 7 day course of doxycycline. 3. Hypertension. He is on multiple blood pressure medications but did not  take them yet today. He will receive a dose of clonidine here prior to proceeding with  ramucirumab.  Disposition: [name] appears stable. He completed 8 cycles of docetaxol/ramucirumab. Beginning with cycle 9 treatment changed to ramucirumab alone. Plan to proceed with cycle 10 today as scheduled.  He will complete a 7 day course of doxycycline for the nailbed drainage/changes related to previous Taxotere.  His blood pressure is elevated. He is on multiple blood pressure medications but did not take them before coming to the office today. I discussed his blood pressure with [name]. He will receive clonidine 0.2 mg in the infusion room. Plan to proceed with ramucirumab once the systolic blood pressure is less than 160.  He will return for a follow-up visit and cycle 11 ramucirumab in 3 weeks. He will contact the office in the interim with any problems.  Plan reviewed with [name]. 25 minutes were spent face-to-face at today's visit with the majority of that time involved in counseling/coordination of care.  Ned Card ANP/GNP-BC   11/05/2015  9:51 AM

## 2015-11-05 NOTE — Patient Instructions (Signed)
[facility] Discharge Instructions for Patients Receiving Chemotherapy  Today you received the following chemotherapy agents Cyramza.  To help prevent nausea and vomiting after your treatment, we encourage you to take your nausea medication as directed.   If you develop nausea and vomiting that is not controlled by your nausea medication, call the clinic.   BELOW ARE SYMPTOMS THAT SHOULD BE REPORTED IMMEDIATELY:  *FEVER GREATER THAN 100.5 F  *CHILLS WITH OR WITHOUT FEVER  NAUSEA AND VOMITING THAT IS NOT CONTROLLED WITH YOUR NAUSEA MEDICATION  *UNUSUAL SHORTNESS OF BREATH  *UNUSUAL BRUISING OR BLEEDING  TENDERNESS IN MOUTH AND THROAT WITH OR WITHOUT PRESENCE OF ULCERS  *URINARY PROBLEMS  *BOWEL PROBLEMS  UNUSUAL RASH Items with * indicate a potential emergency and should be followed up as soon as possible.  Feel free to call the clinic you have any questions or concerns. The clinic phone number is [phone].  Please show the [place] at check-in to the Emergency Department and triage nurse.

## 2015-11-06 ENCOUNTER — Ambulatory Visit: Payer: Medicare Other

## 2015-11-06 ENCOUNTER — Encounter: Payer: Self-pay | Admitting: Internal Medicine

## 2015-11-06 NOTE — Progress Notes (Signed)
I left fmla forms for [facility] for dr.to sign

## 2015-11-07 ENCOUNTER — Encounter: Payer: Self-pay | Admitting: Internal Medicine

## 2015-11-07 NOTE — Progress Notes (Signed)
left form for dr to sign-faxed [phone] and mailed copy to patient and sent to medical records

## 2015-11-26 ENCOUNTER — Ambulatory Visit (HOSPITAL_BASED_OUTPATIENT_CLINIC_OR_DEPARTMENT_OTHER): Payer: No Typology Code available for payment source

## 2015-11-26 ENCOUNTER — Other Ambulatory Visit (HOSPITAL_BASED_OUTPATIENT_CLINIC_OR_DEPARTMENT_OTHER): Payer: Medicare Other

## 2015-11-26 ENCOUNTER — Ambulatory Visit (HOSPITAL_BASED_OUTPATIENT_CLINIC_OR_DEPARTMENT_OTHER): Payer: No Typology Code available for payment source | Admitting: Nurse Practitioner

## 2015-11-26 VITALS — BP 133/72 | HR 83 | Temp 97.9°F | Resp 18 | Ht 69.0 in | Wt 171.2 lb

## 2015-11-26 DIAGNOSIS — C3411 Malignant neoplasm of upper lobe, right bronchus or lung: Secondary | ICD-10-CM

## 2015-11-26 DIAGNOSIS — Z5112 Encounter for antineoplastic immunotherapy: Secondary | ICD-10-CM

## 2015-11-26 DIAGNOSIS — C3491 Malignant neoplasm of unspecified part of right bronchus or lung: Secondary | ICD-10-CM

## 2015-11-26 DIAGNOSIS — I10 Essential (primary) hypertension: Secondary | ICD-10-CM

## 2015-11-26 DIAGNOSIS — C3490 Malignant neoplasm of unspecified part of unspecified bronchus or lung: Secondary | ICD-10-CM

## 2015-11-26 DIAGNOSIS — D491 Neoplasm of unspecified behavior of respiratory system: Secondary | ICD-10-CM

## 2015-11-26 DIAGNOSIS — C7931 Secondary malignant neoplasm of brain: Secondary | ICD-10-CM | POA: Diagnosis not present

## 2015-11-26 LAB — COMPREHENSIVE METABOLIC PANEL
ANION GAP: 6 meq/L (ref 3–11)
AST: 13 U/L (ref 5–34)
Albumin: 3.3 g/dL — ABNORMAL LOW (ref 3.5–5.0)
Alkaline Phosphatase: 55 U/L (ref 40–150)
BUN: 17.4 mg/dL (ref 7.0–26.0)
CHLORIDE: 104 meq/L (ref 98–109)
CO2: 32 meq/L — AB (ref 22–29)
Calcium: 9 mg/dL (ref 8.4–10.4)
Creatinine: 1 mg/dL (ref 0.7–1.3)
EGFR: 72 mL/min/{1.73_m2} — AB (ref >90)
GLUCOSE: 109 mg/dL (ref 70–140)
Potassium: 3.8 mEq/L (ref 3.5–5.1)
SODIUM: 142 meq/L (ref 136–145)
Total Bilirubin: <0.3 mg/dL (ref 0.20–1.20)
Total Protein: 6.2 g/dL — ABNORMAL LOW (ref 6.4–8.3)

## 2015-11-26 LAB — CBC WITH DIFFERENTIAL/PLATELET
BASO%: 0.8 % (ref 0.0–2.0)
Basophils Absolute: 0 10*3/uL (ref 0.0–0.1)
EOS%: 2.7 % (ref 0.0–7.0)
Eosinophils Absolute: 0.1 10*3/uL (ref 0.0–0.5)
HCT: 33.8 % — ABNORMAL LOW (ref 38.4–49.9)
HGB: 10.8 g/dL — ABNORMAL LOW (ref 13.0–17.1)
LYMPH%: 42.2 % (ref 14.0–49.0)
MCH: 31.6 pg (ref 27.2–33.4)
MCHC: 32 g/dL (ref 32.0–36.0)
MCV: 98.9 fL — AB (ref 79.3–98.0)
MONO#: 0.5 10*3/uL (ref 0.1–0.9)
MONO%: 11.9 % (ref 0.0–14.0)
NEUT#: 1.8 10*3/uL (ref 1.5–6.5)
NEUT%: 42.4 % (ref 39.0–75.0)
PLATELETS: 157 10*3/uL (ref 140–400)
RBC: 3.42 10*6/uL — AB (ref 4.20–5.82)
RDW: 16.9 % — ABNORMAL HIGH (ref 11.0–14.6)
WBC: 4.1 10*3/uL (ref 4.0–10.3)
lymph#: 1.7 10*3/uL (ref 0.9–3.3)

## 2015-11-26 LAB — UA PROTEIN, DIPSTICK - CHCC: PROTEIN: NEGATIVE mg/dL

## 2015-11-26 MED ORDER — DIPHENHYDRAMINE HCL 50 MG/ML IJ SOLN
50.0000 mg | Freq: Once | INTRAMUSCULAR | Status: AC
  Administered 2015-11-26: 50 mg via INTRAVENOUS

## 2015-11-26 MED ORDER — SODIUM CHLORIDE 0.9 % IV SOLN
10.0000 mg/kg | Freq: Once | INTRAVENOUS | Status: AC
  Administered 2015-11-26: 800 mg via INTRAVENOUS
  Filled 2015-11-26: qty 20

## 2015-11-26 MED ORDER — ACETAMINOPHEN 325 MG PO TABS
ORAL_TABLET | ORAL | Status: AC
  Filled 2015-11-26: qty 2

## 2015-11-26 MED ORDER — ACETAMINOPHEN 325 MG PO TABS
650.0000 mg | ORAL_TABLET | Freq: Once | ORAL | Status: AC
  Administered 2015-11-26: 650 mg via ORAL

## 2015-11-26 MED ORDER — DIPHENHYDRAMINE HCL 50 MG/ML IJ SOLN
INTRAMUSCULAR | Status: AC
  Filled 2015-11-26: qty 1

## 2015-11-26 MED ORDER — HEPARIN SOD (PORK) LOCK FLUSH 100 UNIT/ML IV SOLN
500.0000 [IU] | Freq: Once | INTRAVENOUS | Status: AC | PRN
  Administered 2015-11-26: 500 [IU]
  Filled 2015-11-26: qty 5

## 2015-11-26 MED ORDER — SODIUM CHLORIDE 0.9 % IV SOLN
Freq: Once | INTRAVENOUS | Status: AC
  Administered 2015-11-26: 10:00:00 via INTRAVENOUS
  Filled 2015-11-26: qty 4

## 2015-11-26 MED ORDER — SODIUM CHLORIDE 0.9 % IJ SOLN
10.0000 mL | INTRAMUSCULAR | Status: DC | PRN
  Administered 2015-11-26: 10 mL
  Filled 2015-11-26: qty 10

## 2015-11-26 MED ORDER — SODIUM CHLORIDE 0.9 % IV SOLN
Freq: Once | INTRAVENOUS | Status: AC
  Administered 2015-11-26: 10:00:00 via INTRAVENOUS

## 2015-11-26 NOTE — Patient Instructions (Signed)
[facility] Discharge Instructions for Patients Receiving Chemotherapy  Today you received the following chemotherapy agents Cyramza.  To help prevent nausea and vomiting after your treatment, we encourage you to take your nausea medication as directed.   If you develop nausea and vomiting that is not controlled by your nausea medication, call the clinic.   BELOW ARE SYMPTOMS THAT SHOULD BE REPORTED IMMEDIATELY:  *FEVER GREATER THAN 100.5 F  *CHILLS WITH OR WITHOUT FEVER  NAUSEA AND VOMITING THAT IS NOT CONTROLLED WITH YOUR NAUSEA MEDICATION  *UNUSUAL SHORTNESS OF BREATH  *UNUSUAL BRUISING OR BLEEDING  TENDERNESS IN MOUTH AND THROAT WITH OR WITHOUT PRESENCE OF ULCERS  *URINARY PROBLEMS  *BOWEL PROBLEMS  UNUSUAL RASH Items with * indicate a potential emergency and should be followed up as soon as possible.  Feel free to call the clinic you have any questions or concerns. The clinic phone number is [phone].  Please show the [place] at check-in to the Emergency Department and triage nurse.

## 2015-11-26 NOTE — Progress Notes (Signed)
[facility] OFFICE PROGRESS NOTE   DIAGNOSIS: Metastatic non-small cell lung cancer, squamous cell carcinoma initially diagnosed as Stage IIA (T2b., N0, M0) non-small cell lung cancer, squamous cell carcinoma diagnosed in December of 2013   PRIOR THERAPY:  1) Status post right upper lobectomy under the care of Dr. Prescott Gum on 09/14/2012.  2) Adjuvant chemotherapy with cisplatin 75 mg/M2 and docetaxel 75 mg/M2 with Neulasta support every 3 weeks, status post 1 cycle. Starting with cycle #2, patient will receive carboplatin for an AUC initially of 4.5 and paclitaxel at 175 mg per meter squared with Neulasta support given every 3 weeks. Status post a total of 3 cycles.  3) Systemic chemotherapy with carboplatin for AUC of 5 on day 1 and gemcitabine 1000 MG/M2 on days 1 and 8 every 3 weeks. First dose 09/11/2014. Status post 4 cycles. Discontinued secondary to intolerance 4) Immunotherapy with Nivolumab 3 MG/KG every 2 weeks. Status post 4 cycles. 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  CURRENT THERAPY: Systemic chemotherapy with docetaxel 75 MG/M2 and Cyramza 10 MG/KG every 3 weeks with Neulasta support. Status post 8 cycles. Starting from cycle #9 and the patient would be treated with only Cyramza as a single agent. Docetaxel was discontinued secondary to intolerance.    INTERVAL HISTORY:   [name] returns as scheduled. He is currently being treated with ramucirumab alone. He completed cycle 10 11/05/2015. He denies nausea/vomiting. No mouth sores. No diarrhea. No rash. He has stable dyspnea. He continues supplemental oxygen. Stable chronic cough. No hemoptysis. Nails remain discolored. He notes less drainage from beneath the nail beds. He has noted lifting of several nails.  Objective:  Vital signs in last 24 hours:  Blood pressure 133/72, pulse 83, temperature 97.9 F (36.6 C), temperature source Oral, resp. rate 18, height  '5\' 9"'$  (1.753 m), weight 171 lb 3.2 oz (77.656 kg), SpO2 96 %.    HEENT: No thrush or ulcers. Resp: Distant breath sounds. No respiratory distress. Cardio: Regular rate and rhythm. GI: Abdomen soft and nontender. No hepatomegaly. Vascular: 1+ pitting edema at the lower legs bilaterally. Skin: Multiple fingernails are discolored. Several nails lift. Port-A-Cath without erythema.    Lab Results:  Lab Results  Component Value Date   WBC 4.1 11/26/2015   HGB 10.8* 11/26/2015   HCT 33.8* 11/26/2015   MCV 98.9* 11/26/2015   PLT 157 11/26/2015   NEUTROABS 1.8 11/26/2015    Imaging:  No results found.  Medications: I have reviewed the patient's current medications.  Assessment/Plan: 1. Metastatic non-small cell lung cancer. Initially diagnosed with stage IIa non-small cell lung cancer December 2013. Status post right upper lobectomy followed by 4 cycles of adjuvant chemotherapy. Found to have recurrent/metastatic disease early 2016 status post carboplatin/gemcitabine 4 cycles discontinued secondary to intolerance; next treated with nivolumab status post 4 cycles discontinued due to progression. Treatment initiated with docetaxel/ramucirumab 04/07/2015. He completed cycle 8 09/25/2015. Restaging CT evaluation 10/10/2015 showed no significant evidence for disease progression. Ramucirumab alone beginning 10/15/2015. 2. Nail changes related to Taxotere. He completed a 7 day course of doxycycline beginning 11/05/2015. 3. Hypertension. He is on multiple blood pressure medications.   Disposition: [name] appears stable. He completed 8 cycles of docetaxel/ramucirumab. Beginning with cycle 9 treatment was changed to ramucirumab alone. He completed cycle 10 on 11/05/2015. Plan to proceed with cycle 11 today as scheduled. He will have restaging CT scans practice prior to his next visit in 3 weeks.  He  will return for a follow-up visit 12/17/2015. He will contact the office in the interim with  any problems.  Plan reviewed with [name].    Ned Card ANP/GNP-BC   11/26/2015  9:39 AM

## 2015-11-27 ENCOUNTER — Ambulatory Visit: Payer: Medicare Other

## 2015-12-01 ENCOUNTER — Encounter: Payer: Self-pay | Admitting: Radiation Therapy

## 2015-12-01 ENCOUNTER — Other Ambulatory Visit: Payer: Self-pay | Admitting: Radiation Therapy

## 2015-12-01 DIAGNOSIS — C7949 Secondary malignant neoplasm of other parts of nervous system: Principal | ICD-10-CM

## 2015-12-01 DIAGNOSIS — C7931 Secondary malignant neoplasm of brain: Secondary | ICD-10-CM

## 2015-12-01 NOTE — Progress Notes (Signed)
.   Do you need a wheel chair? No  2. On oxygen? Yes  3. Have you ever had any surgery in the body part being scanned? No  4. Have you ever had any surgery on your brain or heart?Heart cath done on 09/08/12 w/ coranary angiogram  5. Have you ever had surgery on your eyes or ears? No   6. Do you have a pacemaker or defibrillator? No   7. Do you have a Neurostimulator? No  8. Claustrophobic? No  9. Any risk for metal in eyes? No  10. Injury by bullet, buckshot, or shrapnel? No  11. Stent? No   12. Hx of Cancer? Yes , Lung Cancer with mets to the brain    13. Kidney or Liver disease? No  14. Hx of Lupus, Rheumatoid Arthritis or Scleroderma? No  15. IV Antibiotics or long term use of NSAIDS? No  16. HX of Hypertension? Yes  17. Diabetes? No  18. Allergy to contrast? No  19. Recent labs. Labs drawn 6/7 and in EPIC

## 2015-12-04 ENCOUNTER — Telehealth: Payer: Self-pay | Admitting: *Deleted

## 2015-12-04 NOTE — Telephone Encounter (Addendum)
Call received from patient and spouse ay 4:19 pm reporting "two weeks ago his leg swelling has progressed.  Both legs are swollen.  It goes down at night.  My toes are white and I've lost some nails.  I have pain to only the left leg for the past week and a half that's getting worse.  Like sciatic nerve runs up and down left foot, leg up to the hip.  I hobble but get around.  Took one hydrocodone last night.  No falls or injury and no bruises.  I'm constipated.  Last BM was yesterday after not going for two days.  Had to strain and it looked like a short cigar.  I take stool softeners.  Haven't taken a laxative ina week or so." Advised he take a pain pill for pain at a level of five on pain scale and repeat every six hours if pain not less.  Drink water in addition to the grapefruit juice to help bowels and swelling.  Elevate legs every chance he gets to help decrease swelling.  Will notify provider of these complaints.  Also advised if in a lot of pain at this hour to go to the ED.  Wife says "he's been seen by [facility] in the past."  Last [facility] visit was March 2017.

## 2015-12-05 ENCOUNTER — Telehealth: Payer: Self-pay | Admitting: Medical Oncology

## 2015-12-05 NOTE — Telephone Encounter (Signed)
Persistent sharp pain in left hip and down to  left foot. Very painful when he walks on it. HE states he is hobbling because his hip hurts- He states he has been climbing ladders and painting today , but he denies falling, twisting or  or bumping leg.  When he sits down the pain is relieved.  I instructed pt to go to ED now or call EMS . He said" I am not going to teh ED , i will take it easy , take a pain pill and go to bed" . I again instructed him to go to ED.

## 2015-12-05 NOTE — Telephone Encounter (Signed)
Second call today from spouse asking "what to look out for with swelling in legs, toe and left leg pain reported yesterday.  He's not doing what you suggested and when I bring it up it's an argument.  He's outside on a six foot ladder painting shutters.  I try to give him water but he drinks soda, won't stay off his legs to decrease swelling.  He took one Hydrocodone last night but on that ladder he doesn't need pain pills.  The toenail that's loose he accidentally brushed up against and yelled out when he did."  Message left for collaborative asking of provider orders or instructions.  Will notify [facility] Seen March 2017.  Discussed to decrease activity, rest legs, elevate feet, try pain medicine, avoid hot water with baths/showers and drink water.  Due for next F/U, Taxotere/Cyramza on 12-17-2015.

## 2015-12-09 ENCOUNTER — Telehealth: Payer: Self-pay | Admitting: Internal Medicine

## 2015-12-09 NOTE — Telephone Encounter (Signed)
returned call and s.w pt and confirmed appts....pt ok and aware °

## 2015-12-13 ENCOUNTER — Encounter (HOSPITAL_BASED_OUTPATIENT_CLINIC_OR_DEPARTMENT_OTHER): Payer: Self-pay | Admitting: Emergency Medicine

## 2015-12-13 ENCOUNTER — Emergency Department (HOSPITAL_BASED_OUTPATIENT_CLINIC_OR_DEPARTMENT_OTHER): Payer: Medicare Other

## 2015-12-13 ENCOUNTER — Inpatient Hospital Stay (HOSPITAL_BASED_OUTPATIENT_CLINIC_OR_DEPARTMENT_OTHER)
Admission: EM | Admit: 2015-12-13 | Discharge: 2015-12-16 | DRG: 100 | Disposition: A | Discharge status: Home / Self Care | Payer: Medicare Other | Attending: Internal Medicine | Admitting: Internal Medicine

## 2015-12-13 ENCOUNTER — Inpatient Hospital Stay (HOSPITAL_BASED_OUTPATIENT_CLINIC_OR_DEPARTMENT_OTHER): Payer: Medicare Other

## 2015-12-13 ENCOUNTER — Inpatient Hospital Stay (HOSPITAL_COMMUNITY): Payer: Medicare Other

## 2015-12-13 DIAGNOSIS — J449 Chronic obstructive pulmonary disease, unspecified: Secondary | ICD-10-CM | POA: Diagnosis present

## 2015-12-13 DIAGNOSIS — Z9981 Dependence on supplemental oxygen: Secondary | ICD-10-CM | POA: Diagnosis not present

## 2015-12-13 DIAGNOSIS — Z923 Personal history of irradiation: Secondary | ICD-10-CM

## 2015-12-13 DIAGNOSIS — G2581 Restless legs syndrome: Secondary | ICD-10-CM | POA: Diagnosis present

## 2015-12-13 DIAGNOSIS — Z902 Acquired absence of lung [part of]: Secondary | ICD-10-CM

## 2015-12-13 DIAGNOSIS — G4089 Other seizures: Secondary | ICD-10-CM | POA: Diagnosis present

## 2015-12-13 DIAGNOSIS — G4700 Insomnia, unspecified: Secondary | ICD-10-CM | POA: Diagnosis present

## 2015-12-13 DIAGNOSIS — Z7982 Long term (current) use of aspirin: Secondary | ICD-10-CM | POA: Diagnosis not present

## 2015-12-13 DIAGNOSIS — I10 Essential (primary) hypertension: Secondary | ICD-10-CM | POA: Diagnosis present

## 2015-12-13 DIAGNOSIS — R569 Unspecified convulsions: Secondary | ICD-10-CM

## 2015-12-13 DIAGNOSIS — Z9221 Personal history of antineoplastic chemotherapy: Secondary | ICD-10-CM | POA: Diagnosis not present

## 2015-12-13 DIAGNOSIS — Z87891 Personal history of nicotine dependence: Secondary | ICD-10-CM

## 2015-12-13 DIAGNOSIS — F419 Anxiety disorder, unspecified: Secondary | ICD-10-CM | POA: Diagnosis present

## 2015-12-13 DIAGNOSIS — G936 Cerebral edema: Secondary | ICD-10-CM | POA: Diagnosis present

## 2015-12-13 DIAGNOSIS — C7931 Secondary malignant neoplasm of brain: Secondary | ICD-10-CM | POA: Diagnosis present

## 2015-12-13 DIAGNOSIS — K219 Gastro-esophageal reflux disease without esophagitis: Secondary | ICD-10-CM | POA: Diagnosis present

## 2015-12-13 DIAGNOSIS — E871 Hypo-osmolality and hyponatremia: Secondary | ICD-10-CM | POA: Diagnosis present

## 2015-12-13 DIAGNOSIS — C3490 Malignant neoplasm of unspecified part of unspecified bronchus or lung: Secondary | ICD-10-CM | POA: Diagnosis present

## 2015-12-13 DIAGNOSIS — M1990 Unspecified osteoarthritis, unspecified site: Secondary | ICD-10-CM | POA: Diagnosis present

## 2015-12-13 DIAGNOSIS — G8194 Hemiplegia, unspecified affecting left nondominant side: Secondary | ICD-10-CM | POA: Diagnosis present

## 2015-12-13 DIAGNOSIS — I959 Hypotension, unspecified: Secondary | ICD-10-CM | POA: Diagnosis present

## 2015-12-13 DIAGNOSIS — Z79899 Other long term (current) drug therapy: Secondary | ICD-10-CM | POA: Diagnosis not present

## 2015-12-13 DIAGNOSIS — J9610 Chronic respiratory failure, unspecified whether with hypoxia or hypercapnia: Secondary | ICD-10-CM | POA: Diagnosis present

## 2015-12-13 DIAGNOSIS — J9690 Respiratory failure, unspecified, unspecified whether with hypoxia or hypercapnia: Secondary | ICD-10-CM | POA: Diagnosis present

## 2015-12-13 DIAGNOSIS — R4182 Altered mental status, unspecified: Secondary | ICD-10-CM | POA: Diagnosis present

## 2015-12-13 DIAGNOSIS — Z85118 Personal history of other malignant neoplasm of bronchus and lung: Secondary | ICD-10-CM

## 2015-12-13 DIAGNOSIS — J9600 Acute respiratory failure, unspecified whether with hypoxia or hypercapnia: Secondary | ICD-10-CM | POA: Diagnosis not present

## 2015-12-13 HISTORY — DX: Unspecified convulsions: R56.9

## 2015-12-13 LAB — CBC WITH DIFFERENTIAL/PLATELET
BASOS ABS: 0 10*3/uL (ref 0.0–0.1)
Basophils Relative: 0 %
EOS PCT: 1 %
Eosinophils Absolute: 0.1 10*3/uL (ref 0.0–0.7)
HEMATOCRIT: 41.1 % (ref 39.0–52.0)
Hemoglobin: 13 g/dL (ref 13.0–17.0)
LYMPHS PCT: 49 %
Lymphs Abs: 5.6 10*3/uL — ABNORMAL HIGH (ref 0.7–4.0)
MCH: 31.9 pg (ref 26.0–34.0)
MCHC: 31.6 g/dL (ref 30.0–36.0)
MCV: 101 fL — AB (ref 78.0–100.0)
Monocytes Absolute: 1.4 10*3/uL — ABNORMAL HIGH (ref 0.1–1.0)
Monocytes Relative: 12 %
NEUTROS ABS: 4.3 10*3/uL (ref 1.7–7.7)
Neutrophils Relative %: 38 %
PLATELETS: 208 10*3/uL (ref 150–400)
RBC: 4.07 MIL/uL — AB (ref 4.22–5.81)
RDW: 14.6 % (ref 11.5–15.5)
WBC: 11.4 10*3/uL — AB (ref 4.0–10.5)

## 2015-12-13 LAB — RAPID URINE DRUG SCREEN, HOSP PERFORMED
Amphetamines: NOT DETECTED
BARBITURATES: NOT DETECTED
Benzodiazepines: NOT DETECTED
Cocaine: NOT DETECTED
Opiates: NOT DETECTED
Tetrahydrocannabinol: NOT DETECTED

## 2015-12-13 LAB — POCT I-STAT 3, ART BLOOD GAS (G3+)
Acid-Base Excess: 1 mmol/L (ref 0.0–2.0)
Bicarbonate: 27.5 meq/L — ABNORMAL HIGH (ref 20.0–24.0)
O2 Saturation: 100 %
Patient temperature: 96
TCO2: 29 mmol/L (ref 0–100)
pCO2 arterial: 47.7 mmHg — ABNORMAL HIGH (ref 35.0–45.0)
pH, Arterial: 7.362 (ref 7.350–7.450)
pO2, Arterial: 211 mmHg — ABNORMAL HIGH (ref 80.0–100.0)

## 2015-12-13 LAB — COMPREHENSIVE METABOLIC PANEL
ALBUMIN: 3.9 g/dL (ref 3.5–5.0)
ALT: 8 U/L — AB (ref 17–63)
AST: 23 U/L (ref 15–41)
Alkaline Phosphatase: 60 U/L (ref 38–126)
Anion gap: 15 (ref 5–15)
BUN: 16 mg/dL (ref 6–20)
CHLORIDE: 96 mmol/L — AB (ref 101–111)
CO2: 21 mmol/L — ABNORMAL LOW (ref 22–32)
CREATININE: 0.93 mg/dL (ref 0.61–1.24)
Calcium: 9.4 mg/dL (ref 8.9–10.3)
GFR calc Af Amer: >60 mL/min (ref >60)
GLUCOSE: 124 mg/dL — AB (ref 65–99)
Potassium: 4.3 mmol/L (ref 3.5–5.1)
Sodium: 132 mmol/L — ABNORMAL LOW (ref 135–145)
Total Bilirubin: 0.5 mg/dL (ref 0.3–1.2)
Total Protein: 6.8 g/dL (ref 6.5–8.1)

## 2015-12-13 LAB — URINALYSIS, ROUTINE W REFLEX MICROSCOPIC
Bilirubin Urine: NEGATIVE
Glucose, UA: NEGATIVE mg/dL
Hgb urine dipstick: NEGATIVE
Ketones, ur: NEGATIVE mg/dL
LEUKOCYTES UA: NEGATIVE
Nitrite: NEGATIVE
PROTEIN: NEGATIVE mg/dL
Specific Gravity, Urine: 1.008 (ref 1.005–1.030)
pH: 5.5 (ref 5.0–8.0)

## 2015-12-13 LAB — ETHANOL: Alcohol, Ethyl (B): <5 mg/dL (ref <5)

## 2015-12-13 LAB — GLUCOSE, CAPILLARY: Glucose-Capillary: 109 mg/dL — ABNORMAL HIGH (ref 65–99)

## 2015-12-13 LAB — COMPREHENSIVE METABOLIC PANEL WITH GFR
ALT: 9 U/L — ABNORMAL LOW (ref 17–63)
AST: 19 U/L (ref 15–41)
Albumin: 3.3 g/dL — ABNORMAL LOW (ref 3.5–5.0)
Alkaline Phosphatase: 49 U/L (ref 38–126)
Anion gap: 9 (ref 5–15)
BUN: 12 mg/dL (ref 6–20)
CO2: 22 mmol/L (ref 22–32)
Calcium: 8.2 mg/dL — ABNORMAL LOW (ref 8.9–10.3)
Chloride: 98 mmol/L — ABNORMAL LOW (ref 101–111)
Creatinine, Ser: 0.73 mg/dL (ref 0.61–1.24)
GFR calc Af Amer: >60 mL/min
GFR calc non Af Amer: >60 mL/min
Glucose, Bld: 114 mg/dL — ABNORMAL HIGH (ref 65–99)
Potassium: 3.8 mmol/L (ref 3.5–5.1)
Sodium: 129 mmol/L — ABNORMAL LOW (ref 135–145)
Total Bilirubin: 0.8 mg/dL (ref 0.3–1.2)
Total Protein: 6.3 g/dL — ABNORMAL LOW (ref 6.5–8.1)

## 2015-12-13 LAB — CORTISOL: CORTISOL PLASMA: 4.9 ug/dL

## 2015-12-13 LAB — LACTIC ACID, PLASMA: Lactic Acid, Venous: 0.9 mmol/L (ref 0.5–2.0)

## 2015-12-13 LAB — CBC
HCT: 35.9 % — ABNORMAL LOW (ref 39.0–52.0)
Hemoglobin: 11.6 g/dL — ABNORMAL LOW (ref 13.0–17.0)
MCH: 30.9 pg (ref 26.0–34.0)
MCHC: 32.3 g/dL (ref 30.0–36.0)
MCV: 95.5 fL (ref 78.0–100.0)
Platelets: 141 K/uL — ABNORMAL LOW (ref 150–400)
RBC: 3.76 MIL/uL — ABNORMAL LOW (ref 4.22–5.81)
RDW: 14.5 % (ref 11.5–15.5)
WBC: 10.5 K/uL (ref 4.0–10.5)

## 2015-12-13 LAB — MRSA PCR SCREENING: MRSA by PCR: NEGATIVE

## 2015-12-13 LAB — APTT
APTT: 26 s (ref 24–37)
aPTT: 32 s (ref 24–37)

## 2015-12-13 LAB — PROCALCITONIN: Procalcitonin: <0.1 ng/mL

## 2015-12-13 LAB — PROTIME-INR
INR: 0.92 (ref 0.00–1.49)
INR: 0.99 (ref 0.00–1.49)
Prothrombin Time: 12.6 s (ref 11.6–15.2)
Prothrombin Time: 13.3 s (ref 11.6–15.2)

## 2015-12-13 LAB — PHOSPHORUS: Phosphorus: 2.1 mg/dL — ABNORMAL LOW (ref 2.5–4.6)

## 2015-12-13 LAB — MAGNESIUM: Magnesium: 1.9 mg/dL (ref 1.7–2.4)

## 2015-12-13 LAB — STREP PNEUMONIAE URINARY ANTIGEN: Strep Pneumo Urinary Antigen: NEGATIVE

## 2015-12-13 LAB — TROPONIN I: Troponin I: <0.03 ng/mL

## 2015-12-13 MED ORDER — SODIUM CHLORIDE 0.9 % IV BOLUS (SEPSIS)
500.0000 mL | Freq: Once | INTRAVENOUS | Status: AC
  Administered 2015-12-13: 500 mL via INTRAVENOUS

## 2015-12-13 MED ORDER — MIDAZOLAM HCL 10 MG/2ML IJ SOLN: 2.0000 mg | Freq: Once | INTRAMUSCULAR | Status: DC

## 2015-12-13 MED ORDER — VECURONIUM BROMIDE 10 MG IV SOLR
INTRAVENOUS | Status: AC
  Filled 2015-12-13: qty 10

## 2015-12-13 MED ORDER — MIDAZOLAM HCL 10 MG/2ML IJ SOLN
10.0000 mg | Freq: Once | INTRAMUSCULAR | Status: AC
  Administered 2015-12-13: 10 mg via INTRAVENOUS

## 2015-12-13 MED ORDER — SODIUM CHLORIDE 0.9 % IV SOLN
INTRAVENOUS | Status: AC
  Administered 2015-12-13: 150 mL/h via INTRAVENOUS

## 2015-12-13 MED ORDER — DEXAMETHASONE SODIUM PHOSPHATE 10 MG/ML IJ SOLN
10.0000 mg | Freq: Once | INTRAMUSCULAR | Status: AC
  Administered 2015-12-13: 10 mg via INTRAVENOUS
  Filled 2015-12-13: qty 1

## 2015-12-13 MED ORDER — MIDAZOLAM HCL 2 MG/2ML IJ SOLN
1.0000 mg | INTRAMUSCULAR | Status: DC | PRN
Start: 2015-12-13 — End: 2015-12-15

## 2015-12-13 MED ORDER — MIDAZOLAM HCL 10 MG/2ML IJ SOLN
INTRAMUSCULAR | Status: AC
  Filled 2015-12-13: qty 2

## 2015-12-13 MED ORDER — CHLORHEXIDINE GLUCONATE 0.12% ORAL RINSE (MEDLINE KIT)
15.0000 mL | Freq: Two times a day (BID) | OROMUCOSAL | Status: DC
  Administered 2015-12-13 – 2015-12-14 (×2): 15 mL via OROMUCOSAL

## 2015-12-13 MED ORDER — SODIUM CHLORIDE 0.9 % IV SOLN
1000.0000 mL | Freq: Once | INTRAVENOUS | Status: AC
  Administered 2015-12-13: 1000 mL via INTRAVENOUS

## 2015-12-13 MED ORDER — IPRATROPIUM-ALBUTEROL 0.5-2.5 (3) MG/3ML IN SOLN
3.0000 mL | Freq: Four times a day (QID) | RESPIRATORY_TRACT | Status: DC
  Administered 2015-12-13 – 2015-12-15 (×9): 3 mL via RESPIRATORY_TRACT
  Filled 2015-12-13 (×9): qty 3

## 2015-12-13 MED ORDER — FENTANYL CITRATE (PF) 100 MCG/2ML IJ SOLN
50.0000 ug | INTRAMUSCULAR | Status: DC | PRN
  Administered 2015-12-13: 50 ug via INTRAVENOUS

## 2015-12-13 MED ORDER — VECURONIUM BROMIDE 10 MG IV SOLR
INTRAVENOUS | Status: AC
Start: 2015-12-13 — End: 2015-12-14
  Filled 2015-12-13: qty 10

## 2015-12-13 MED ORDER — PROPOFOL 1000 MG/100ML IV EMUL
5.0000 ug/kg/min | INTRAVENOUS | Status: DC
  Administered 2015-12-13: 5 ug/kg/min via INTRAVENOUS

## 2015-12-13 MED ORDER — DEXAMETHASONE SODIUM PHOSPHATE 4 MG/ML IJ SOLN
4.0000 mg | Freq: Four times a day (QID) | INTRAMUSCULAR | Status: DC
  Administered 2015-12-13 – 2015-12-16 (×11): 4 mg via INTRAVENOUS
  Filled 2015-12-13 (×11): qty 1

## 2015-12-13 MED ORDER — VECURONIUM BROMIDE 10 MG IV SOLR: 10.0000 mg | Freq: Once | INTRAVENOUS | Status: DC

## 2015-12-13 MED ORDER — SODIUM CHLORIDE 0.9 % IV SOLN
INTRAVENOUS | Status: DC
  Administered 2015-12-13: 15:00:00 via INTRAVENOUS

## 2015-12-13 MED ORDER — SODIUM CHLORIDE 0.9 % IV SOLN
1500.0000 mg | Freq: Two times a day (BID) | INTRAVENOUS | Status: DC
  Administered 2015-12-13 – 2015-12-15 (×5): 1500 mg via INTRAVENOUS
  Filled 2015-12-13 (×7): qty 15

## 2015-12-13 MED ORDER — PROPOFOL 1000 MG/100ML IV EMUL
INTRAVENOUS | Status: AC
  Filled 2015-12-13: qty 100

## 2015-12-13 MED ORDER — SUCCINYLCHOLINE CHLORIDE 20 MG/ML IJ SOLN
INTRAMUSCULAR | Status: AC | PRN
  Administered 2015-12-13: 100 mg via INTRAVENOUS

## 2015-12-13 MED ORDER — VECURONIUM BROMIDE 10 MG IV SOLR
10.0000 mg | Freq: Once | INTRAVENOUS | Status: AC
  Administered 2015-12-13: 10 mg via INTRAVENOUS

## 2015-12-13 MED ORDER — ANTISEPTIC ORAL RINSE SOLUTION (CORINZ)
7.0000 mL | Freq: Four times a day (QID) | OROMUCOSAL | Status: DC
  Administered 2015-12-13 – 2015-12-14 (×2): 7 mL via OROMUCOSAL

## 2015-12-13 MED ORDER — PROPOFOL 1000 MG/100ML IV EMUL
5.0000 ug/kg/min | INTRAVENOUS | Status: DC
  Administered 2015-12-13: 5 ug/kg/min via INTRAVENOUS
  Administered 2015-12-14: 40 ug/kg/min via INTRAVENOUS
  Filled 2015-12-13 (×4): qty 100

## 2015-12-13 MED ORDER — PHENYLEPHRINE HCL 10 MG/ML IJ SOLN
0.0000 ug/min | INTRAVENOUS | Status: DC
  Administered 2015-12-13: 20 ug/min via INTRAVENOUS
  Filled 2015-12-13: qty 1

## 2015-12-13 MED ORDER — FENTANYL CITRATE (PF) 100 MCG/2ML IJ SOLN
50.0000 ug | INTRAMUSCULAR | Status: DC | PRN
  Administered 2015-12-13 (×2): 50 ug via INTRAVENOUS
  Filled 2015-12-13 (×3): qty 2

## 2015-12-13 MED ORDER — SODIUM CHLORIDE 0.9 % IV SOLN
1000.0000 mL | INTRAVENOUS | Status: DC
  Administered 2015-12-13: 1000 mL via INTRAVENOUS

## 2015-12-13 MED ORDER — ETOMIDATE 2 MG/ML IV SOLN
INTRAVENOUS | Status: AC | PRN
  Administered 2015-12-13: 20 mg via INTRAVENOUS

## 2015-12-13 MED ORDER — FAMOTIDINE IN NACL 20-0.9 MG/50ML-% IV SOLN
20.0000 mg | Freq: Two times a day (BID) | INTRAVENOUS | Status: DC
  Administered 2015-12-13 – 2015-12-15 (×5): 20 mg via INTRAVENOUS
  Filled 2015-12-13 (×6): qty 50

## 2015-12-13 MED ORDER — LEVETIRACETAM 500 MG/5ML IV SOLN
INTRAVENOUS | Status: AC
  Administered 2015-12-13: 1500 mg
  Filled 2015-12-13: qty 15

## 2015-12-13 MED ORDER — MIDAZOLAM HCL 2 MG/2ML IJ SOLN
1.0000 mg | INTRAMUSCULAR | Status: DC | PRN
  Administered 2015-12-13 (×2): 1 mg via INTRAVENOUS
  Filled 2015-12-13 (×2): qty 2

## 2015-12-13 MED ORDER — SODIUM CHLORIDE 0.9 % IV SOLN
1500.0000 mg | Freq: Once | INTRAVENOUS | Status: AC
  Administered 2015-12-13: 1500 mg via INTRAVENOUS
  Filled 2015-12-13: qty 15

## 2015-12-13 MED ORDER — SODIUM CHLORIDE 0.9 % IV SOLN
INTRAVENOUS | Status: DC
  Administered 2015-12-13 – 2015-12-14 (×2): via INTRAVENOUS

## 2015-12-13 MED ORDER — SODIUM CHLORIDE 0.9 % IV SOLN
INTRAVENOUS | Status: AC
  Administered 2015-12-13: 21:00:00 via INTRAVENOUS

## 2015-12-13 NOTE — H&P (Signed)
PULMONARY / CRITICAL CARE MEDICINE   Name: [name] MRN: [id number] DOB: [birth year]    ADMISSION DATE:  12/13/2015   REFERRING MD:  EDP  CHIEF COMPLAINT:  AMS  HISTORY OF PRESENT ILLNESS:   75 yo with recurrent lung cancer and brain mets who developed left sided weakness and szs 6/3. Intubated and transported to [facility] to [facility] service Dx with squamous cell lung cancer 2013 per FOB by Dr. Gwenette Greet. Tx with chemo per [name] , rt upper lobe resection per Nils Pyle. He developed reoccurrence of cancer 2016 with brain mets. Treated with chemo and RTX .  He presented [facility] 6/3 with let sided weakness, tonic clonic sz in med center and intubated.   PAST MEDICAL HISTORY :  He  has a past medical history of Arthritis; Wheezing; Sore throat; Carotid artery occlusion; COPD (chronic obstructive pulmonary disease) ([place]); Lumbar disc disease; Restless leg syndrome; Allergic rhinitis; Hypertension; Anxiety; Shortness of breath; Lung mass; GERD (gastroesophageal reflux disease); H/O hiatal hernia; Pre-operative cardiovascular examination; Nonspecific abnormal unspecified cardiovascular function study; Peripheral vascular disease, unspecified ([place]); Pneumonia (2014); On home oxygen therapy; Constipation; S/P radiation therapy (09/25/14); S/P radiation therapy (12/04/14 srs); Cancer ([place]) (09/14/12); and Metastasis to brain [facility]) (02/2015).  PAST SURGICAL HISTORY: He  has past surgical history that includes Carotid endarterectomy (10/15/2010); Hand surgery; Video bronchoscopy (07/24/2012); Lobectomy (Right, 09/14/2012); Video assisted thoracoscopy (Right, 09/14/2012); Video bronchoscopy with endobronchial ultrasound (N/A, 08/27/2014); Scalene node biopsy (Right, 08/27/2014); and sterotactic radiosurgery (Right, 09/25/14).  No Known Allergies  Current Facility-Administered Medications on File Prior to Encounter  Medication  . sodium chloride 0.9 % injection 10 mL   Current Outpatient Prescriptions on File  Prior to Encounter  Medication Sig  . amLODipine (NORVASC) 5 MG tablet Take 1 tablet (5 mg total) by mouth daily. May take an additional tablet as needed for elevated blood pressure.  Marland Kitchen aspirin EC 81 MG tablet Take 81 mg by mouth daily.   . clonazePAM (KLONOPIN) 0.5 MG tablet Take 0.5 mg by mouth 2 (two) times daily as needed for anxiety. Takes one tablet by mouth two times daily as needed for anxiety.  Marland Kitchen dexamethasone (DECADRON) 4 MG tablet 2 tablets by mouth twice a day the day before, day of and day after the chemotherapy every 3 weeks.  Marland Kitchen doxycycline (VIBRA-TABS) 100 MG tablet Take 1 tablet (100 mg total) by mouth 2 (two) times daily.  . furosemide (LASIX) 20 MG tablet Change to Lasix 20 mg BID x 7 days; then return to 20 mg Q AM.  . guaiFENesin (MUCINEX) 600 MG 12 hr tablet Take 1,200 mg by mouth 2 (two) times daily. Reported on 06/30/2015  . HYDROcodone-acetaminophen (NORCO) 7.5-325 MG tablet Take 1 tablet by mouth every 6 (six) hours as needed for moderate pain.  Marland Kitchen ipratropium-albuterol (DUONEB) 0.5-2.5 (3) MG/3ML SOLN Take 3 mLs by nebulization 4 (four) times daily. (Patient taking differently: Take 3 mLs by nebulization 4 (four) times daily. Pt takes nebulizer 2 to 3 times daily.)  . isosorbide mononitrate (IMDUR) 30 MG 24 hr tablet Take 1 tablet (30 mg total) by mouth daily.  Marland Kitchen lisinopril (PRINIVIL,ZESTRIL) 40 MG tablet Take 40 mg by mouth daily.  . metoprolol tartrate (LOPRESSOR) 25 MG tablet Take 25 mg by mouth 2 (two) times daily.   Marland Kitchen omeprazole (PRILOSEC) 40 MG capsule Take 40 mg by mouth daily.  . pentoxifylline (TRENTAL) 400 MG CR tablet Take 1 tablet (400 mg total) by mouth 3 (three) times  daily.  . pramipexole (MIRAPEX) 1 MG tablet Take 1 mg by mouth at bedtime.  . prochlorperazine (COMPAZINE) 10 MG tablet Take 10 mg by mouth every 6 (six) hours as needed for nausea or vomiting. Reported on 09/25/2015  . traMADol (ULTRAM) 50 MG tablet Take 1 tablet (50 mg total) by mouth every 12  (twelve) hours as needed. (Patient taking differently: Take 50 mg by mouth every 12 (twelve) hours as needed (PAIN). )  . vitamin E (VITAMIN E) 1000 UNIT capsule Take 1 capsule (1,000 Units total) by mouth daily.  Marland Kitchen zolpidem (AMBIEN) 10 MG tablet Take 10 mg by mouth at bedtime as needed for sleep. Reported on 09/25/2015    FAMILY HISTORY:  His indicated that his mother is deceased. He indicated that his father is deceased. He indicated that his sister is alive. He indicated that only one of his two brothers is alive.   SOCIAL HISTORY: He  reports that he quit smoking about 15 months ago. His smoking use included Cigarettes. He has a 55 pack-year smoking history. He quit smokeless tobacco use about 63 years ago. He reports that he drinks alcohol. He reports that he does not use illicit drugs.  REVIEW OF SYSTEMS:   NA  SUBJECTIVE:  Sedated on vent  VITAL SIGNS: BP 132/97 mmHg  Pulse 73  Temp(Src) 95.7 F (35.4 C) (Core (Comment))  Resp 15  SpO2 100%  HEMODYNAMICS:    VENTILATOR SETTINGS: Vent Mode:  [-] PRVC FiO2 (%):  [50 %-100 %] 50 % Set Rate:  [14 bmp-22 bmp] 22 bmp Vt Set:  [460 mL-500 mL] 460 mL PEEP:  [5 cmH20] 5 cmH20 Plateau Pressure:  [21 cmH20] 21 cmH20  INTAKE / OUTPUT:    PHYSICAL EXAMINATION: General: WNWDWM on vent with recent nmb Neuro:  Starting to follow commands with right side HEENT:  Ett-> vent Cardiovascular:  HSR RRR Lungs: Coarse rhonchi bilaterally Abdomen: Soft +bs Musculoskeletal:  inatct Skin:  Warm and dry  LABS:  BMET  Recent Labs Lab 12/13/15 1310  NA 132*  K 4.3  CL 96*  CO2 21*  BUN 16  CREATININE 0.93  GLUCOSE 124*    Electrolytes  Recent Labs Lab 12/13/15 1310  CALCIUM 9.4    CBC  Recent Labs Lab 12/13/15 1310  WBC 11.4*  HGB 13.0  HCT 41.1  PLT 208    Coag's  Recent Labs Lab 12/13/15 1329  APTT 32  INR 0.92    Sepsis Markers No results for input(s): LATICACIDVEN, PROCALCITON, O2SATVEN in the  last 168 hours.  ABG No results for input(s): PHART, PCO2ART, PO2ART in the last 168 hours.  Liver Enzymes  Recent Labs Lab 12/13/15 1310  AST 23  ALT 8*  ALKPHOS 60  BILITOT 0.5  ALBUMIN 3.9    Cardiac Enzymes  Recent Labs Lab 12/13/15 1310  TROPONINI <0.03    Glucose No results for input(s): GLUCAP in the last 168 hours.  Imaging Ct Head Wo Contrast  12/13/2015  CLINICAL DATA:  Metastatic lung cancer to the brain, seizure activity EXAM: CT HEAD WITHOUT CONTRAST TECHNIQUE: Contiguous axial images were obtained from the base of the skull through the vertex without intravenous contrast. COMPARISON:  10/06/2015, 06/25/2015 FINDINGS: Known high right parietal lesion is difficult to visualize without contrast. Compared to the prior brain MRI 10/06/2015, there does appear to be increased surrounding vasogenic edema and mass effect with localized sulcal effacement in the right parietal lobe. Vasogenic edema extends into the adjacent white matter  in a larger pattern than the previous MRI. The other smaller brain lesions by MRI are not demonstrated by noncontrast CT. No acute intracranial hemorrhage, midline shift, herniation, hydrocephalus, or extra-axial fluid collection. Cisterns are patent. No cerebellar abnormality appreciated. Orbits are symmetric. Mastoids and sinuses remain clear.  Skull appears intact. Exam is limited with some motion artifact. IMPRESSION: Increased right parietal lobe cortical and white matter edema pattern about the known right parietal metastasis with localized mass effect and sulcal effacement. No associated intracranial hemorrhage Other known small brain metastases are not well demonstrated by noncontrast imaging. No hydrocephalus or significant herniation. These results were called by telephone at the time of interpretation on 12/13/2015 at 2:22 pm to Dr. Charlesetta Shanks , who verbally acknowledged these results. Electronically Signed   By: Jerilynn Mages.  Shick M.D.   On:  12/13/2015 14:33   Dg Chest Port 1 View  12/13/2015  CLINICAL DATA:  Left-sided weakness, seizure activity, intubated EXAM: PORTABLE CHEST 1 VIEW COMPARISON:  06/20/2015 FINDINGS: Endotracheal tube 4.1 cm above the carina. Right IJ power port catheter tip mid SVC. Monitor leads and defibrillator pads noted across the chest. Postop changes from previous right upper lobectomy with dense apical consolidation on the right. Stable chronic volume loss in the right hemi thorax. No superimposed edema, significant collapse or consolidation. No pneumothorax. Aortic atherosclerosis noted. IMPRESSION: Endotracheal tube 4 cm above the carina. Otherwise stable postoperative appearance of the chest. No superimposed definite acute process. Electronically Signed   By: Jerilynn Mages.  Shick M.D.   On: 12/13/2015 14:17     STUDIES:  Ct HEAD AS NOTED 6/3  CULTURES: 6/3 bc x 2>> 6/3 uc>> 6/3 sputum>>  ANTIBIOTICS:   SIGNIFICANT EVENTS: 6/3 szs left sided hemiplegia  LINES/TUBES: 6/3 ett>> Prtacath>>  DISCUSSION: 76 yo with recurrent lung cancer and brain mets who developed left sided weakness and szs 6/3. Intubated and transported to [facility] to [facility] service.  ASSESSMENT / PLAN:  PULMONARY A: Intubated for airway protection COPD Lung cancer P:   Vent bundle Wean as tolerated  CARDIOVASCULAR A:  Hypotension with sedation with diprivan P:  Stop diprivan IVF as needed  RENAL Lab Results  Component Value Date   CREATININE 0.93 12/13/2015   CREATININE 1.0 11/26/2015   CREATININE 0.8 11/05/2015   CREATININE 1.0 10/15/2015   CREATININE 0.52* 12/05/2014   CREATININE 0.64 11/14/2014    A:   No acute issue P:     GASTROINTESTINAL A:   GI protection P:   PPI  HEMATOLOGIC A:   Diagnosed with squamous cell lung ca 12/13 post chemo and rul lobectomy 2016 recurrent cancer with brain mets  P:  Hemonc consult  INFECTIOUS A:   No overt infectious process P:   Pan culture  ENDOCRINE A:    No acute issue P:   Follow glucose  NEUROLOGIC A:   Post szs in setting of lung cancer. Ct scan 6/3 increased rt parietal lobe edema. Left arm flaccid on admit. Intubated post szs P:   RASS goal: 0 Sedate as needed Neuro consult   FAMILY  - Updates: No family at bedside  - Inter-disciplinary family meet or Palliative Care meeting due by: June 13 Richardson Landry Minor ACNP Maryanna Shape PCCM Pager [phone] till 3 pm If no answer page [phone] 12/13/2015, 4:25 PM   Attending note: I have seen and examined the patient with nurse practitioner/resident and agree with the note. History, labs and imaging reviewed.  75 Y/o with recurrent metastatic lung cancer. Admitted with  seizures, lt weakness.  CT shows brain edema from parietal mets  Neuro consult appreciated.  Start keppra, steroids. MRI brian Keep supported on vent for now Sedation with propofol. Watch for BP Start Neo via port if BP falls.  Family updated at bedside. Critical care time- 45 mins. This represents my time independent of the NPs time taking care of the patient.  Marshell Garfinkel MD [facility] Pager [phone] If no answer or after 3pm call: [phone] 12/13/2015, 6:23 PM

## 2015-12-13 NOTE — ED Notes (Signed)
Pt to ED via POV with wife who reports left sided weakness, 45 minutes PTA, pt alert, verbal, 2 assist to wheelchair and then to room 14. Pt began to seize while transferring from wheelchair to stretcher. EDP to bedside. Oral suctioning provided, pt with strong right femoral pulse.  Seizure lasted for approximately one minute.

## 2015-12-13 NOTE — Progress Notes (Signed)
eLink Physician-Brief Progress Note Patient Name: [name] DOB: [birth year] MRN: [id number]   Date of Service  12/13/2015  HPI/Events of Note  Persistent hypotension  eICU Interventions  Phenylephrine drip started     Intervention Category Major Interventions: Hypotension - evaluation and management  Mauri [place], P 12/13/2015, 9:59 PM

## 2015-12-13 NOTE — ED Notes (Signed)
Patient was brought in via Private vehicle with new onset left sided weakness and spasms. The patient while trying to get him in the bed  - the patient has a hx of brain cancer

## 2015-12-13 NOTE — ED Provider Notes (Signed)
CSN: [id number]     Arrival date & time 12/13/15  1305 History   First MD Initiated Contact with Patient 12/13/15 1327     Chief Complaint  Patient presents with  . Seizures     (Consider location/radiation/quality/duration/timing/severity/associated sxs/prior Treatment) HPI Patient has a history of lung cancer. His wife reports he also has several known areas of tumor or potentially radiation associated lesions in his brain. He however has no prior history of seizure disorder. Reportedly he had been working in the yard and was alert and appropriate. At approximately 12:30 he developed weakness and inability to use his left arm and leg. His wife reports on the drive over his arm was having a spasm motion. As the nurses were assisting the patient to transfer to room he began having a full tonic-clonic seizure. Past Medical History  Diagnosis Date  . Arthritis   . Wheezing   . Sore throat   . Carotid artery occlusion   . COPD (chronic obstructive pulmonary disease) ([place])   . Lumbar disc disease   . Restless leg syndrome   . Allergic rhinitis   . Hypertension   . Anxiety     anxiety  . Shortness of breath   . Lung mass   . GERD (gastroesophageal reflux disease)   . H/O hiatal hernia   . Pre-operative cardiovascular examination   . Nonspecific abnormal unspecified cardiovascular function study   . Peripheral vascular disease, unspecified ([place])   . Pneumonia 2014    ?   Marland Kitchen On home oxygen therapy     prn  . Constipation   . 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 S/P radiation therapy 12/04/14 srs    lt frontal brain  . Cancer ([place]) 09/14/12    invasive mod diff squamous cell ca  . Metastasis to brain [facility] 02/2015   Past Surgical History  Procedure Laterality Date  . Carotid endarterectomy  10/15/2010    left  . Hand surgery      repair of the left long, ring, and small finger after a saw injury  . Video bronchoscopy  07/24/2012    Procedure: VIDEO BRONCHOSCOPY WITH  FLUORO;  Surgeon: Kathee Delton, MD;  Location: [facility];  Service: Cardiopulmonary;  Laterality: Bilateral;  . Lobectomy Right 09/14/2012    Procedure: LOBECTOMY;  Surgeon: Ivin Poot, MD;  Location: [facility];  Service: Thoracic;  Laterality: Right;  RIGHT UPPER LOBECTOMY  . Video assisted thoracoscopy Right 09/14/2012    Procedure: VIDEO ASSISTED THORACOSCOPY;  Surgeon: Ivin Poot, MD;  Location[facility];  Service: Thoracic;  Laterality: Right;  . Video bronchoscopy with endobronchial ultrasound N/A 08/27/2014    Procedure: VIDEO BRONCHOSCOPY WITH ENDOBRONCHIAL ULTRASOUND;  Surgeon: Ivin Poot, MD;  Location: [facility];  Service: Thoracic;  Laterality: N/A;  . Scalene node biopsy Right 08/27/2014    Procedure: BIOPSY SCALENE NODE;  Surgeon: Ivin Poot, MD;  Location: [facility] OR;  Service: Thoracic;  Laterality: Right;  . Sterotactic radiosurgery Right 09/25/14    right parietal 30Gy/1 fx   Family History  Problem Relation Age of Onset  . Heart disease Father     MI  . Heart attack Father   . Alcohol abuse Mother   . Stroke Brother   . Heart disease Brother   . Depression Sister   . Heart attack Brother    Social History  Substance Use Topics  . Smoking status: Former Smoker -- 1.00  packs/day for 55 years    Types: Cigarettes    Quit date: 08/16/2014  . Smokeless tobacco: Former Systems developer    Quit date: 04/19/1952  . Alcohol Use: Yes     Comment: occianional- not since I ve sick.    Review of Systems Cannot obtain review of systems due to patient condition.  Allergies  Review of patient's allergies indicates no known allergies.  Home Medications   Prior to Admission medications   Medication Sig Start Date End Date Taking? Authorizing Provider  amLODipine (NORVASC) 5 MG tablet Take 1 tablet (5 mg total) by mouth daily. May take an additional tablet as needed for elevated blood pressure. 09/25/15   Jettie Booze, MD  aspirin EC 81 MG tablet Take 81 mg by mouth daily.      Historical Provider, MD  clonazePAM (KLONOPIN) 0.5 MG tablet Take 0.5 mg by mouth 2 (two) times daily as needed for anxiety. Takes one tablet by mouth two times daily as needed for anxiety.    Historical Provider, MD  dexamethasone (DECADRON) 4 MG tablet 2 tablets by mouth twice a day the day before, day of and day after the chemotherapy every 3 weeks. 08/13/15   Curt Bears, MD  doxycycline (VIBRA-TABS) 100 MG tablet Take 1 tablet (100 mg total) by mouth 2 (two) times daily. 11/05/15   Owens Shark, NP  furosemide (LASIX) 20 MG tablet Change to Lasix 20 mg BID x 7 days; then return to 20 mg Q AM. 07/29/15   Susanne Borders, NP  guaiFENesin (MUCINEX) 600 MG 12 hr tablet Take 1,200 mg by mouth 2 (two) times daily. Reported on 06/30/2015    Historical Provider, MD  HYDROcodone-acetaminophen (NORCO) 7.5-325 MG tablet Take 1 tablet by mouth every 6 (six) hours as needed for moderate pain. 09/25/15   Laurie Panda, NP  ipratropium-albuterol (DUONEB) 0.5-2.5 (3) MG/3ML SOLN Take 3 mLs by nebulization 4 (four) times daily. Patient taking differently: Take 3 mLs by nebulization 4 (four) times daily. Pt takes nebulizer 2 to 3 times daily. 12/18/14   Noralee Space, MD  isosorbide mononitrate (IMDUR) 30 MG 24 hr tablet Take 1 tablet (30 mg total) by mouth daily. 08/12/15   Jettie Booze, MD  lisinopril (PRINIVIL,ZESTRIL) 40 MG tablet Take 40 mg by mouth daily. 04/23/15   Historical Provider, MD  metoprolol tartrate (LOPRESSOR) 25 MG tablet Take 25 mg by mouth 2 (two) times daily.  10/22/14   Historical Provider, MD  omeprazole (PRILOSEC) 40 MG capsule Take 40 mg by mouth daily.    Historical Provider, MD  pentoxifylline (TRENTAL) 400 MG CR tablet Take 1 tablet (400 mg total) by mouth 3 (three) times daily. 10/08/15   Hayden Pedro, PA-C  pramipexole (MIRAPEX) 1 MG tablet Take 1 mg by mouth at bedtime.    Historical Provider, MD  prochlorperazine (COMPAZINE) 10 MG tablet Take 10 mg by mouth every 6  (six) hours as needed for nausea or vomiting. Reported on 09/25/2015    Historical Provider, MD  traMADol (ULTRAM) 50 MG tablet Take 1 tablet (50 mg total) by mouth every 12 (twelve) hours as needed. Patient taking differently: Take 50 mg by mouth every 12 (twelve) hours as needed (PAIN).  01/29/15   Laurie Panda, NP  vitamin E (VITAMIN E) 1000 UNIT capsule Take 1 capsule (1,000 Units total) by mouth daily. 10/08/15   Hayden Pedro, PA-C  zolpidem (AMBIEN) 10 MG tablet Take 10 mg by mouth at  bedtime as needed for sleep. Reported on 09/25/2015    Historical Provider, MD   BP 132/97 mmHg  Pulse 73  Temp(Src) 95.7 F (35.4 C) (Core (Comment))  Resp 15  SpO2 100% Physical Exam  Constitutional: He appears well-developed and well-nourished.  Patient was first encountered having a tonic-clonic seizure. Patient is unresponsive with active tonic-clonic motion and color slightly dusky.  HENT:  Head: Normocephalic and atraumatic.  Mouth/Throat: Oropharynx is clear and moist.  The patient is edentulous.  Eyes:  Pupils are 2 mm and symmetric. No Scleral injection. No periorbital swelling.  Cardiovascular: Normal rate, normal heart sounds and intact distal pulses.   Pulmonary/Chest:  The patient is initially examined with seizure activity with colored dusky and poor respirations. After seizure had resolved and patient was breathing spontaneously, decreased breath sounds on the left relative to right. Fine expiratory wheeze.  Abdominal: Soft. He exhibits no distension.  Musculoskeletal:  Approximate 2+ pitting edema bilateral lower extremities.  Neurological:  Post the seizure activity, the patient is unresponsive. No purposeful movements.    ED Course  .Intubation Date/Time: 12/13/2015 3:51 PM Performed by: Charlesetta Shanks Authorized by: Charlesetta Shanks Indications: airway protection Intubation method: video-assisted Patient status: paralyzed (RSI) Preoxygenation: nonrebreather  mask Sedatives: etomidate Paralytic: succinylcholine Laryngoscope size: Mac 4 Tube size: 8.0 mm Tube type: cuffed Number of attempts: 1 Post-procedure assessment: chest rise and CO2 detector Breath sounds: equal Cuff inflated: yes Tube secured with: ETT holder Chest x-ray interpreted by me and radiologist. Chest x-ray findings: endotracheal tube in appropriate position Patient tolerance: Patient tolerated the procedure well with no immediate complications Comments: Patient intubated by PA-C Dansie under my direct supervision and management. Done upon first attempt without desaturation or VS instability.   (including critical care time) CRITICAL CARE Performed by: Charlesetta Shanks   Total critical care time: 45 minutes  Critical care time was exclusive of separately billable procedures and treating other patients.  Critical care was necessary to treat or prevent imminent or life-threatening deterioration.  Critical care was time spent personally by me on the following activities: development of treatment plan with patient and/or surrogate as well as nursing, discussions with consultants, evaluation of patient's response to treatment, examination of patient, obtaining history from patient or surrogate, ordering and performing treatments and interventions, ordering and review of laboratory studies, ordering and review of radiographic studies, pulse oximetry and re-evaluation of patient's condition. Labs Review Labs Reviewed  CBC WITH DIFFERENTIAL/PLATELET - Abnormal; Notable for the following:    WBC 11.4 (*)    RBC 4.07 (*)    MCV 101.0 (*)    Lymphs Abs 5.6 (*)    Monocytes Absolute 1.4 (*)    All other components within normal limits  COMPREHENSIVE METABOLIC PANEL - Abnormal; Notable for the following:    Sodium 132 (*)    Chloride 96 (*)    CO2 21 (*)    Glucose, Bld 124 (*)    ALT 8 (*)    All other components within normal limits  ETHANOL  PROTIME-INR  APTT  URINE  RAPID DRUG SCREEN, HOSP PERFORMED  URINALYSIS, ROUTINE W REFLEX MICROSCOPIC (NOT AT [facility])  TROPONIN I    Imaging Review Ct Head Wo Contrast  12/13/2015  CLINICAL DATA:  Metastatic lung cancer to the brain, seizure activity EXAM: CT HEAD WITHOUT CONTRAST TECHNIQUE: Contiguous axial images were obtained from the base of the skull through the vertex without intravenous contrast. COMPARISON:  10/06/2015, 06/25/2015 FINDINGS: Known high right parietal lesion is  difficult to visualize without contrast. Compared to the prior brain MRI 10/06/2015, there does appear to be increased surrounding vasogenic edema and mass effect with localized sulcal effacement in the right parietal lobe. Vasogenic edema extends into the adjacent white matter in a larger pattern than the previous MRI. The other smaller brain lesions by MRI are not demonstrated by noncontrast CT. No acute intracranial hemorrhage, midline shift, herniation, hydrocephalus, or extra-axial fluid collection. Cisterns are patent. No cerebellar abnormality appreciated. Orbits are symmetric. Mastoids and sinuses remain clear.  Skull appears intact. Exam is limited with some motion artifact. IMPRESSION: Increased right parietal lobe cortical and white matter edema pattern about the known right parietal metastasis with localized mass effect and sulcal effacement. No associated intracranial hemorrhage Other known small brain metastases are not well demonstrated by noncontrast imaging. No hydrocephalus or significant herniation. These results were called by telephone at the time of interpretation on 12/13/2015 at 2:22 pm to Dr. Charlesetta Shanks , who verbally acknowledged these results. Electronically Signed   By: Jerilynn Mages.  Shick M.D.   On: 12/13/2015 14:33   Dg Chest Port 1 View  12/13/2015  CLINICAL DATA:  Left-sided weakness, seizure activity, intubated EXAM: PORTABLE CHEST 1 VIEW COMPARISON:  06/20/2015 FINDINGS: Endotracheal tube 4.1 cm above the carina. Right IJ power  port catheter tip mid SVC. Monitor leads and defibrillator pads noted across the chest. Postop changes from previous right upper lobectomy with dense apical consolidation on the right. Stable chronic volume loss in the right hemi thorax. No superimposed edema, significant collapse or consolidation. No pneumothorax. Aortic atherosclerosis noted. IMPRESSION: Endotracheal tube 4 cm above the carina. Otherwise stable postoperative appearance of the chest. No superimposed definite acute process. Electronically Signed   By: Jerilynn Mages.  Shick M.D.   On: 12/13/2015 14:17   I have personally reviewed and evaluated these images and lab results as part of my medical decision-making.   EKG Interpretation   Date/Time:  Saturday December 13 2015 13:14:00 EDT Ventricular Rate:  60 PR Interval:  216 QRS Duration: 111 QT Interval:  440 QTC Calculation: 440 R Axis:   87 Text Interpretation:  Sinus rhythm Left atrial enlargement Borderline  right axis deviation Baseline wander in lead(s) V6 agree. no STEMI. no sig  change from old Confirmed by Johnney Killian, MD, Jeannie Done [phone]) on 12/13/2015  3:50:17 PM     Consult: Dr. Silverio Decamp. Advises for administration of 1500 mg of Keppra and direct admission to ICU with intensivist. Consult: Review with intensivist Dr.Mannam. Accepts for ICU admission. MDM   Final diagnoses:  Acute left hemiparesis ([place])  New onset seizure ([place])  History of lung cancer   Patient has known metastatic lung cancer. He had an episode of acute mental status change and left-sided weakness. Findings on CT and history correlate most closely with an increasing metastases as the likely underlying etiology for the events today. Patient arrived in extremis with tonic-clonic activity and poor responsiveness. Patient was intubated for airway protection and mental status change. Patient had a period of hypotension after intubation. Initial pressures and pressures reported by the wife were significantly elevated earlier  today. Our blood pressures were taken during active tonic-clonic seizure and very elevated. Once seizure had resolved , patient's blood pressures were in 130/90s. Propofol was initiated for sedation. Patient had a significant blood pressure drop to 60 systolic. Propofol was discontinued and IV fluids administered. The patient required 2 L of fluids before he could safely be administered sedation medication again. His heart rate remained steady  and sinus and oxygenation remained in the 100%.    Charlesetta Shanks, MD 12/13/15 1600

## 2015-12-13 NOTE — Progress Notes (Signed)
Pt arrived on the unit. Nonpurposeful movements, jerkings of arms. VS stable. Fentanyl and versed given as PRN. MD at the bedside. States to restart Propofol, and if Neo needed to start we will use Port cath. Family at the bedside, education and update given, Bed in low position, alarms are on, bed rails are padded, call bell in reach

## 2015-12-14 ENCOUNTER — Inpatient Hospital Stay (HOSPITAL_COMMUNITY): Payer: Medicare Other

## 2015-12-14 DIAGNOSIS — G8194 Hemiplegia, unspecified affecting left nondominant side: Secondary | ICD-10-CM

## 2015-12-14 LAB — CBC
HEMATOCRIT: 31.6 % — AB (ref 39.0–52.0)
Hemoglobin: 10.1 g/dL — ABNORMAL LOW (ref 13.0–17.0)
MCH: 30.8 pg (ref 26.0–34.0)
MCHC: 32 g/dL (ref 30.0–36.0)
MCV: 96.3 fL (ref 78.0–100.0)
PLATELETS: 132 10*3/uL — AB (ref 150–400)
RBC: 3.28 MIL/uL — ABNORMAL LOW (ref 4.22–5.81)
RDW: 14.9 % (ref 11.5–15.5)
WBC: 3.8 10*3/uL — AB (ref 4.0–10.5)

## 2015-12-14 LAB — URINE CULTURE: Culture: NO GROWTH

## 2015-12-14 LAB — BLOOD GAS, ARTERIAL
Acid-base deficit: 2.1 mmol/L — ABNORMAL HIGH (ref 0.0–2.0)
BICARBONATE: 23.3 meq/L (ref 20.0–24.0)
DRAWN BY: 419771
FIO2: 0.4
MECHVT: 500 mL
O2 SAT: 98.1 %
PATIENT TEMPERATURE: 98.6
PCO2 ART: 48.4 mmHg — AB (ref 35.0–45.0)
PEEP/CPAP: 5 cmH2O
PH ART: 7.304 — AB (ref 7.350–7.450)
PO2 ART: 118 mmHg — AB (ref 80.0–100.0)
RATE: 14 resp/min
TCO2: 24.8 mmol/L (ref 0–100)

## 2015-12-14 LAB — BASIC METABOLIC PANEL
ANION GAP: 6 (ref 5–15)
BUN: 14 mg/dL (ref 6–20)
CALCIUM: 7.9 mg/dL — AB (ref 8.9–10.3)
CO2: 24 mmol/L (ref 22–32)
Chloride: 102 mmol/L (ref 101–111)
Creatinine, Ser: 1 mg/dL (ref 0.61–1.24)
GFR calc Af Amer: >60 mL/min (ref >60)
Glucose, Bld: 147 mg/dL — ABNORMAL HIGH (ref 65–99)
Potassium: 4.8 mmol/L (ref 3.5–5.1)
Sodium: 132 mmol/L — ABNORMAL LOW (ref 135–145)

## 2015-12-14 MED ORDER — GADOBENATE DIMEGLUMINE 529 MG/ML IV SOLN
15.0000 mL | Freq: Once | INTRAVENOUS | Status: AC | PRN
  Administered 2015-12-14: 15 mL via INTRAVENOUS

## 2015-12-14 MED ORDER — CHLORHEXIDINE GLUCONATE 0.12 % MT SOLN
15.0000 mL | Freq: Two times a day (BID) | OROMUCOSAL | Status: DC
  Administered 2015-12-14 – 2015-12-15 (×4): 15 mL via OROMUCOSAL
  Filled 2015-12-14 (×2): qty 15

## 2015-12-14 MED ORDER — SODIUM CHLORIDE 0.9 % IV SOLN
INTRAVENOUS | Status: DC
  Administered 2015-12-15: 06:00:00 via INTRAVENOUS

## 2015-12-14 MED ORDER — CETYLPYRIDINIUM CHLORIDE 0.05 % MT LIQD
7.0000 mL | Freq: Two times a day (BID) | OROMUCOSAL | Status: DC
  Administered 2015-12-14 – 2015-12-15 (×4): 7 mL via OROMUCOSAL

## 2015-12-14 NOTE — Procedures (Signed)
Extubation Procedure Note  Patient Details:   Name: [name] DOB: 19-Oct-1940 MRN: [id number]   Airway Documentation:     Evaluation  O2 sats: stable throughout Complications: No apparent complications Patient did tolerate procedure well. Bilateral Breath Sounds: Clear, Diminished   Yes   Pt extubated to 3L [place] per MD order. Pt stable throughout with no complications. Pt able to speak and has a strong non productive cough. Pt with clear/diminished BS throughout. RT will continue to monitor.   Jesse Sans 12/14/2015, 9:37 AM

## 2015-12-14 NOTE — H&P (Signed)
PULMONARY / CRITICAL CARE MEDICINE   Name: [name] MRN: [id number] DOB: Nov 08, 1940    ADMISSION DATE:  12/13/2015   REFERRING MD:  EDP  CHIEF COMPLAINT:  AMS  HISTORY OF PRESENT ILLNESS:   75 yo with recurrent lung cancer and brain mets who developed left sided weakness and szs 6/3. Intubated and [facility] Dx with squamous cell lung cancer 2013 per [facility] by Dr. Gwenette Greet. Tx with chemo per [name] , rt upper lobe resection per Nils Pyle. He developed reoccurrence of cancer 2016 with brain mets. Treated with chemo and RTX .  He presented [facility] 6/3 with let sided weakness, tonic clonic sz in med center and intubated.   PAST MEDICAL HISTORY :  He  has a past medical history of Arthritis; Wheezing; Sore throat; Carotid artery occlusion; COPD (chronic obstructive pulmonary disease) ([place]); Lumbar disc disease; Restless leg syndrome; Allergic rhinitis; Hypertension; Anxiety; Shortness of breath; Lung mass; GERD (gastroesophageal reflux disease); H/O hiatal hernia; Pre-operative cardiovascular examination; Nonspecific abnormal unspecified cardiovascular function study; Peripheral vascular disease, unspecified ([name]); Pneumonia (2014); On home oxygen therapy; Constipation; S/P radiation therapy (09/25/14); S/P radiation therapy (12/04/14 srs); Cancer ([place]) (09/14/12); and Metastasis to brain [facility]) (02/2015).  PAST SURGICAL HISTORY: He  has past surgical history that includes Carotid endarterectomy (10/15/2010); Hand surgery; Video bronchoscopy (07/24/2012); Lobectomy (Right, 09/14/2012); Video assisted thoracoscopy (Right, 09/14/2012); Video bronchoscopy with endobronchial ultrasound (N/A, 08/27/2014); Scalene node biopsy (Right, 08/27/2014); and sterotactic radiosurgery (Right, 09/25/14).  No Known Allergies  Current Facility-Administered Medications on File Prior to Encounter  Medication  . sodium chloride 0.9 % injection 10 mL   Current Outpatient Prescriptions on File  Prior to Encounter  Medication Sig  . amLODipine (NORVASC) 5 MG tablet Take 1 tablet (5 mg total) by mouth daily. May take an additional tablet as needed for elevated blood pressure. (Patient taking differently: Take 5 mg by mouth at bedtime. )  . aspirin EC 81 MG tablet Take 81 mg by mouth daily.   . clonazePAM (KLONOPIN) 0.5 MG tablet Take 0.5 mg by mouth 2 (two) times daily as needed for anxiety. Takes one tablet by mouth two times daily as needed for anxiety.  . furosemide (LASIX) 20 MG tablet Change to Lasix 20 mg BID x 7 days; then return to 20 mg Q AM. (Patient taking differently: Take 20 mg by mouth daily. )  . HYDROcodone-acetaminophen (NORCO) 7.5-325 MG tablet Take 1 tablet by mouth every 6 (six) hours as needed for moderate pain.  Marland Kitchen ipratropium-albuterol (DUONEB) 0.5-2.5 (3) MG/3ML SOLN Take 3 mLs by nebulization 4 (four) times daily. (Patient taking differently: Take 3 mLs by nebulization 4 (four) times daily. Pt takes nebulizer 2 to 3 times daily.)  . isosorbide mononitrate (IMDUR) 30 MG 24 hr tablet Take 1 tablet (30 mg total) by mouth daily.  Marland Kitchen lisinopril (PRINIVIL,ZESTRIL) 40 MG tablet Take 40 mg by mouth at bedtime.   . metoprolol tartrate (LOPRESSOR) 25 MG tablet Take 25 mg by mouth 2 (two) times daily.   Marland Kitchen omeprazole (PRILOSEC) 40 MG capsule Take 40 mg by mouth daily.  . pentoxifylline (TRENTAL) 400 MG CR tablet Take 1 tablet (400 mg total) by mouth 3 (three) times daily.  . pramipexole (MIRAPEX) 1 MG tablet Take 1 mg by mouth at bedtime.  . prochlorperazine (COMPAZINE) 10 MG tablet Take 10 mg by mouth every 6 (six) hours as needed for nausea or vomiting. Reported on 09/25/2015  . traMADol Veatrice Bourbon)  50 MG tablet Take 1 tablet (50 mg total) by mouth every 12 (twelve) hours as needed. (Patient taking differently: Take 50 mg by mouth daily as needed (pain). )  . vitamin E (VITAMIN E) 1000 UNIT capsule Take 1 capsule (1,000 Units total) by mouth daily.  Marland Kitchen zolpidem (AMBIEN) 10 MG tablet  Take 10 mg by mouth at bedtime as needed for sleep. Reported on 09/25/2015    FAMILY HISTORY:  His indicated that his mother is deceased. He indicated that his father is deceased. He indicated that his sister is alive. He indicated that only one of his two brothers is alive.   SOCIAL HISTORY: He  reports that he quit smoking about 15 months ago. His smoking use included Cigarettes. He has a 55 pack-year smoking history. He quit smokeless tobacco use about 63 years ago. He reports that he drinks alcohol. He reports that he does not use illicit drugs.  REVIEW OF SYSTEMS:   NA  SUBJECTIVE:  Doing well on weaning trial  VITAL SIGNS: BP 122/64 mmHg  Pulse 88  Temp(Src) 98.3 F (36.8 C) (Oral)  Resp 24  Ht 5' 9.02" (1.753 m)  Wt 176 lb 11.2 oz (80.151 kg)  BMI 26.08 kg/m2  SpO2 100%  HEMODYNAMICS:    VENTILATOR SETTINGS: Vent Mode:  [-] PRVC FiO2 (%):  [40 %-100 %] 40 % Set Rate:  [14 bmp-22 bmp] 14 bmp Vt Set:  [460 mL-500 mL] 500 mL PEEP:  [5 cmH20] 5 cmH20 Plateau Pressure:  [14 cmH20-21 cmH20] 14 cmH20  INTAKE / OUTPUT: I/O last 3 completed shifts: In: 4083.7 [I.V.:3918.7; IV Piggyback:165] Out: 1600 [Urine:1600]  PHYSICAL EXAMINATION: General: Awake, no distress Neuro:  More movement on left side. HEENT:  ETT in place. Moist mucus membranes. Cardiovascular:  RRR, No MRG Lungs: Clear, no wheeze or crackles Abdomen: Soft + BS Musculoskeletal:  Intact. Skin:  Warm and dry  LABS:  BMET  Recent Labs Lab 12/13/15 1310 12/13/15 1658 12/14/15 0304  NA 132* 129* 132*  K 4.3 3.8 4.8  CL 96* 98* 102  CO2 21* 22 24  BUN '16 12 14  '$ CREATININE 0.93 0.73 1.00  GLUCOSE 124* 114* 147*    Electrolytes  Recent Labs Lab 12/13/15 1310 12/13/15 1658 12/14/15 0304  CALCIUM 9.4 8.2* 7.9*  MG  --  1.9  --   PHOS  --  2.1*  --     CBC  Recent Labs Lab 12/13/15 1310 12/13/15 1658 12/14/15 0304  WBC 11.4* 10.5 3.8*  HGB 13.0 11.6* 10.1*  HCT 41.1 35.9*  31.6*  PLT 208 141* 132*    Coag's  Recent Labs Lab 12/13/15 1329 12/13/15 1658  APTT 32 26  INR 0.92 0.99    Sepsis Markers  Recent Labs Lab 12/13/15 1658  LATICACIDVEN 0.9  PROCALCITON <0.10    ABG  Recent Labs Lab 12/13/15 1713 12/14/15 0500  PHART 7.362 7.304*  PCO2ART 47.7* 48.4*  PO2ART 211.0* 118*    Liver Enzymes  Recent Labs Lab 12/13/15 1310 12/13/15 1658  AST 23 19  ALT 8* 9*  ALKPHOS 60 49  BILITOT 0.5 0.8  ALBUMIN 3.9 3.3*    Cardiac Enzymes  Recent Labs Lab 12/13/15 1310  TROPONINI <0.03    Glucose  Recent Labs Lab 12/13/15 1716  GLUCAP 109*    Imaging Ct Head Wo Contrast  12/13/2015  CLINICAL DATA:  Metastatic lung cancer to the brain, seizure activity EXAM: CT HEAD WITHOUT CONTRAST TECHNIQUE: Contiguous axial images were  obtained from the base of the skull through the vertex without intravenous contrast. COMPARISON:  10/06/2015, 06/25/2015 FINDINGS: Known high right parietal lesion is difficult to visualize without contrast. Compared to the prior brain MRI 10/06/2015, there does appear to be increased surrounding vasogenic edema and mass effect with localized sulcal effacement in the right parietal lobe. Vasogenic edema extends into the adjacent white matter in a larger pattern than the previous MRI. The other smaller brain lesions by MRI are not demonstrated by noncontrast CT. No acute intracranial hemorrhage, midline shift, herniation, hydrocephalus, or extra-axial fluid collection. Cisterns are patent. No cerebellar abnormality appreciated. Orbits are symmetric. Mastoids and sinuses remain clear.  Skull appears intact. Exam is limited with some motion artifact. IMPRESSION: Increased right parietal lobe cortical and white matter edema pattern about the known right parietal metastasis with localized mass effect and sulcal effacement. No associated intracranial hemorrhage Other known small brain metastases are not well demonstrated by  noncontrast imaging. No hydrocephalus or significant herniation. These results were called by telephone at the time of interpretation on 12/13/2015 at 2:22 pm to Dr. Charlesetta Shanks , who verbally acknowledged these results. Electronically Signed   By: Jerilynn Mages.  Shick M.D.   On: 12/13/2015 14:33   Mr Jeri Cos OE Contrast  12/14/2015  CLINICAL DATA:  Initial evaluation for acute left-sided weakness and seizure. History of metastatic disease. EXAM: MRI HEAD WITHOUT AND WITH CONTRAST TECHNIQUE: Multiplanar, multiecho pulse sequences of the brain and surrounding structures were obtained without and with intravenous contrast. CONTRAST:  10m MULTIHANCE GADOBENATE DIMEGLUMINE 529 MG/ML IV SOLN COMPARISON:  Prior CT from earlier the same day as well as previous MRI from 10/06/2015. FINDINGS: Cerebral volume is stable. Mild confluent T2/FLAIR hyperintensity within the periventricular white matter most consistent with chronic small vessel ischemic disease. Chronic small vessel ischemic changes present within the pons as well. These changes are stable relative to prior. Metastatic lesion at the right parietal lobe is increased in size now measuring 2.1 x 2.4 x 2.2 cm (series 13, image 40, previously 1.1 x 1.3 x 1.2 cm). Surrounding vasogenic edema it is worsened relative to most recent MRI. No internal hemorrhage on gradient echo sequence. Additional lesion along the gray-white matter junction within the left frontal lobe is also increased in size, now measuring 8 mm (series 13, image 31), previously 5 mm). Surrounding edema about this lesion has also worsened. No interval hemorrhage. Additional small lesion along the anterior inferior left frontal convexity measuring 6 mm is stable the slightly increased in size (series 13, image 23). Additional faint lesion within the right cerebellar hemisphere measuring approximately 4 mm is not significantly changed. No definite new lesions. Overall, findings are consistent with progressive  disease. No evidence for acute infarct. Major intracranial vascular flow voids are preserved. Gray-white matter differentiation otherwise maintained. No other changes related to seizure. No acute or chronic intracranial hemorrhage. No midline shift. Basilar cisterns are patent. No hydrocephalus. No extra-axial fluid collection. Major dural sinuses are grossly patent. Craniocervical junction normal. Visualized upper cervical spine unremarkable. Pituitary gland within normal limits. No acute abnormality about the orbits. Patient is status post lens extraction on the right. Scattered mucosal thickening within the paranasal sinuses with fluid level within the lateral recess of the right sphenoid sinus. Bilateral mastoid effusions. Fluid within the nasopharynx. Patient is likely intubated. Bone marrow signal intensity within normal limits. No scalp soft tissue abnormality. IMPRESSION: 1. Overall interval progression of disease, with increased size of right parietal and left frontal lobe  lesions as above with increased associated edema. 2. Additional smaller lesions within the anterior left frontal lobe and right cerebellum are overall not significantly changed in size. No definite new lesions identified. 3. No other acute intracranial process. Electronically Signed   By: Jeannine Boga M.D.   On: 12/14/2015 03:33   Dg Chest Port 1 View  12/13/2015  CLINICAL DATA:  Left-sided weakness, seizure activity, intubated EXAM: PORTABLE CHEST 1 VIEW COMPARISON:  06/20/2015 FINDINGS: Endotracheal tube 4.1 cm above the carina. Right IJ power port catheter tip mid SVC. Monitor leads and defibrillator pads noted across the chest. Postop changes from previous right upper lobectomy with dense apical consolidation on the right. Stable chronic volume loss in the right hemi thorax. No superimposed edema, significant collapse or consolidation. No pneumothorax. Aortic atherosclerosis noted. IMPRESSION: Endotracheal tube 4 cm above  the carina. Otherwise stable postoperative appearance of the chest. No superimposed definite acute process. Electronically Signed   By: Jerilynn Mages.  Shick M.D.   On: 12/13/2015 14:17    STUDIES:  Ct HEAD AS NOTED 6/3 MRI 6/4 > progression of brain mets with edema.  CULTURES: 6/3 bc x 2>> 6/3 uc>> 6/3 sputum>>  ANTIBIOTICS: None  SIGNIFICANT EVENTS: 6/3 szs left sided hemiplegia  LINES/TUBES: 6/3 ett>> Prtacath>>  DISCUSSION: 75 yo with recurrent lung cancer and brain mets who developed left sided weakness and szs 6/3. Intubated and transported to [facility] to [facility] service.  ASSESSMENT / PLAN:  PULMONARY A: Intubated for airway protection COPD Lung cancer P:   Vent bundle Plan for extubation today.  CARDIOVASCULAR A:  Hypotension with sedation with diprivan Brief hypotension. Off neo now. P:  Stop diprivan IVF as needed   RENAL Stable A:   No acute issue P:   Monitor urine output and Cr  GASTROINTESTINAL A:   GI protection P:   PPI  HEMATOLOGIC A:   Diagnosed with squamous cell lung ca 12/13 post chemo and rul lobectomy 2016 recurrent cancer with brain mets. 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  Worsening brain mets with edema P:   Wake up. D/C sedation. Keppra for seizure control Decadron  Neuro consult  FAMILY  - Updates: Wife and daughter updated at bedside 6/4. - Inter-disciplinary family meet or Palliative Care meeting due by: June 13  Critical care time- 35 mins.  Marshell Garfinkel MD [place] [facility] Pager [phone] If no answer or after 3pm call: [phone] 12/14/2015, 9:24 AM

## 2015-12-15 ENCOUNTER — Ambulatory Visit
Admit: 2015-12-15 | Discharge: 2015-12-15 | Disposition: A | Discharge status: Home / Self Care | Payer: Medicare Other | Attending: Radiation Oncology | Admitting: Radiation Oncology

## 2015-12-15 ENCOUNTER — Inpatient Hospital Stay (HOSPITAL_COMMUNITY): Payer: Medicare Other

## 2015-12-15 ENCOUNTER — Telehealth: Payer: Self-pay

## 2015-12-15 ENCOUNTER — Ambulatory Visit (HOSPITAL_COMMUNITY): Payer: Medicare Other

## 2015-12-15 ENCOUNTER — Telehealth: Payer: Self-pay | Admitting: Internal Medicine

## 2015-12-15 DIAGNOSIS — G8194 Hemiplegia, unspecified affecting left nondominant side: Secondary | ICD-10-CM | POA: Insufficient documentation

## 2015-12-15 DIAGNOSIS — Z85118 Personal history of other malignant neoplasm of bronchus and lung: Secondary | ICD-10-CM

## 2015-12-15 LAB — BASIC METABOLIC PANEL
Anion gap: 5 (ref 5–15)
BUN: 23 mg/dL — AB (ref 6–20)
CHLORIDE: 103 mmol/L (ref 101–111)
CO2: 24 mmol/L (ref 22–32)
CREATININE: 1.14 mg/dL (ref 0.61–1.24)
Calcium: 8.1 mg/dL — ABNORMAL LOW (ref 8.9–10.3)
GFR calc Af Amer: >60 mL/min (ref >60)
GFR calc non Af Amer: >60 mL/min (ref >60)
Glucose, Bld: 147 mg/dL — ABNORMAL HIGH (ref 65–99)
Potassium: 4.8 mmol/L (ref 3.5–5.1)
Sodium: 132 mmol/L — ABNORMAL LOW (ref 135–145)

## 2015-12-15 LAB — CBC
HEMATOCRIT: 31.9 % — AB (ref 39.0–52.0)
HEMOGLOBIN: 10.1 g/dL — AB (ref 13.0–17.0)
MCH: 30.5 pg (ref 26.0–34.0)
MCHC: 31.7 g/dL (ref 30.0–36.0)
MCV: 96.4 fL (ref 78.0–100.0)
Platelets: 151 10*3/uL (ref 150–400)
RBC: 3.31 MIL/uL — ABNORMAL LOW (ref 4.22–5.81)
RDW: 15.2 % (ref 11.5–15.5)
WBC: 5.9 10*3/uL (ref 4.0–10.5)

## 2015-12-15 MED ORDER — ISOSORBIDE MONONITRATE ER 30 MG PO TB24
30.0000 mg | ORAL_TABLET | Freq: Every day | ORAL | Status: DC
  Administered 2015-12-16: 30 mg via ORAL
  Filled 2015-12-15: qty 1

## 2015-12-15 MED ORDER — AMLODIPINE BESYLATE 5 MG PO TABS
5.0000 mg | ORAL_TABLET | Freq: Every day | ORAL | Status: DC
Start: 2015-12-15 — End: 2015-12-16
  Administered 2015-12-16: 5 mg via ORAL
  Filled 2015-12-15: qty 1

## 2015-12-15 MED ORDER — METOPROLOL TARTRATE 25 MG PO TABS
25.0000 mg | ORAL_TABLET | Freq: Two times a day (BID) | ORAL | Status: DC
  Administered 2015-12-16 (×2): 25 mg via ORAL
  Filled 2015-12-15 (×2): qty 1

## 2015-12-15 MED ORDER — PRAMIPEXOLE DIHYDROCHLORIDE 1 MG PO TABS
1.0000 mg | ORAL_TABLET | Freq: Every day | ORAL | Status: DC
  Administered 2015-12-16: 1 mg via ORAL
  Filled 2015-12-15: qty 1

## 2015-12-15 NOTE — Consult Note (Signed)
Requesting Physician: Dr.  Vaughan Browner    Reason for consultation:  To manage seizures  HPI:                                                                                                                                         [name] is an 75 y.o. male patient who is transferred to [facility] ICU following seizures and intubation. History of metastatic lung cancer with brain mets, known large right parietal metastatic lesion, status post radiation therapy. 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 He never had any seizures previously. Earlier today, his wife noted that he developed tonic-clonic activity in the left upper extremity, with left gaze deviation and head deviation followed by weakness on the left side. He was initially evaluated at an outside facility ER, was intubated for airway protection.   Dx with squamous cell lung cancer 2013 per [facility] by Dr. Gwenette Greet. Tx with chemo per [name] , rt upper lobe resection per Nils Pyle. He developed reoccurrence of cancer 2016 with brain mets. Treated with chemo and RTX .   is Past Medical History: Past Medical History  Diagnosis Date  . Arthritis   . Wheezing   . Sore throat   . Carotid artery occlusion   . COPD (chronic obstructive pulmonary disease) ([place])   . Lumbar disc disease   . Restless leg syndrome   . Allergic rhinitis   . Hypertension   . Anxiety     anxiety  . Shortness of breath   . Lung mass   . GERD (gastroesophageal reflux disease)   . H/O hiatal hernia   . Pre-operative cardiovascular examination   . Nonspecific abnormal unspecified cardiovascular function study   . Peripheral vascular disease, unspecified ([place])   . Pneumonia 2014    ?   Marland Kitchen On home oxygen therapy     prn  . Constipation   . 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 S/P radiation therapy 12/04/14 srs    lt frontal brain  . Cancer ([place]) 09/14/12    invasive mod diff squamous cell ca  . Metastasis to brain [facility]) 02/2015     Past Surgical History  Procedure Laterality Date  . Carotid endarterectomy  10/15/2010    left  . Hand surgery      repair of the left long, ring, and small finger after a saw injury  . Video bronchoscopy  07/24/2012    Procedure: VIDEO BRONCHOSCOPY WITH FLUORO;  Surgeon: Kathee Delton, MD;  Location: [facility];  Service: Cardiopulmonary;  Laterality: Bilateral;  . Lobectomy Right 09/14/2012    Procedure: LOBECTOMY;  Surgeon: Ivin Poot, MD;  Location: [place];  Service: Thoracic;  Laterality: Right;  RIGHT UPPER LOBECTOMY  . Video assisted thoracoscopy Right 09/14/2012    Procedure: VIDEO ASSISTED THORACOSCOPY;  Surgeon: Ivin Poot, MD;  Location: [facility] OR;  Service: Thoracic;  Laterality: Right;  . Video bronchoscopy with endobronchial ultrasound N/A 08/27/2014    Procedure: VIDEO BRONCHOSCOPY WITH ENDOBRONCHIAL ULTRASOUND;  Surgeon: Ivin Poot, MD;  Location: [facility] OR;  Service: Thoracic;  Laterality: N/A;  . Scalene node biopsy Right 08/27/2014    Procedure: BIOPSY SCALENE NODE;  Surgeon: Ivin Poot, MD;  Location: [facility] OR;  Service: Thoracic;  Laterality: Right;  . Sterotactic radiosurgery Right 09/25/14    right parietal 30Gy/1 fx    Family History: Family History  Problem Relation Age of Onset  . Heart disease Father     MI  . Heart attack Father   . Alcohol abuse Mother   . Stroke Brother   . Heart disease Brother   . Depression Sister   . Heart attack Brother     Social History:   reports that he quit smoking about 15 months ago. His smoking use included Cigarettes. He has a 55 pack-year smoking history. He quit smokeless tobacco use about 63 years ago. He reports that he drinks alcohol. He reports that he does not use illicit drugs.  Allergies:  No Known Allergies   Medications:                                                                                                                         Current facility-administered medications:  .  0.9 %   sodium chloride infusion, , Intravenous, Continuous, Praveen Mannam, MD, Last Rate: 10 mL/hr at 12/14/15 2107 .  antiseptic oral rinse (CPC / CETYLPYRIDINIUM CHLORIDE 0.05%) solution 7 mL, 7 mL, Mouth Rinse, q12n4p, Praveen Mannam, MD, 7 mL at 12/14/15 1721 .  chlorhexidine (PERIDEX) 0.12 % solution 15 mL, 15 mL, Mouth Rinse, BID, Praveen Mannam, MD, 15 mL at 12/14/15 2124 .  dexamethasone (DECADRON) injection 4 mg, 4 mg, Intravenous, Q6H, Karthik Whittinghill Fuller Mandril, MD, 4 mg at 12/14/15 2307 .  famotidine (PEPCID) IVPB 20 mg premix, 20 mg, Intravenous, Q12H, William S Minor, NP, 20 mg at 12/14/15 2108 .  fentaNYL (SUBLIMAZE) injection 50 mcg, 50 mcg, Intravenous, Q15 min PRN, Grace Bushy Minor, NP, 50 mcg at 12/13/15 2355 .  fentaNYL (SUBLIMAZE) injection 50 mcg, 50 mcg, Intravenous, Q2H PRN, Grace Bushy Minor, NP, 50 mcg at 12/13/15 1955 .  ipratropium-albuterol (DUONEB) 0.5-2.5 (3) MG/3ML nebulizer solution 3 mL, 3 mL, Nebulization, Q6H, William S Minor, NP, 3 mL at 12/14/15 1917 .  levETIRAcetam (KEPPRA) 1,500 mg in sodium chloride 0.9 % 100 mL IVPB, 1,500 mg, Intravenous, Q12H, Kimberlly Norgard Fuller Mandril, MD, 1,500 mg at 12/14/15 2308 .  midazolam (VERSED) injection 1 mg, 1 mg, Intravenous, Q15 min PRN, Grace Bushy Minor, NP .  midazolam (VERSED) injection 1 mg, 1 mg, Intravenous, Q2H PRN, Grace Bushy Minor, NP, 1 mg at 12/13/15 2344 .  phenylephrine (NEO-SYNEPHRINE) 10 mg in dextrose 5 % 250 mL (0.04 mg/mL) infusion, 0-200 mcg/min, Intravenous, Titrated, Mauri Brooklyn, MD, Stopped at 12/13/15 2234 .  propofol (DIPRIVAN) 1000 MG/100ML infusion, 5-70 mcg/kg/min, Intravenous, Titrated, Marshell Garfinkel, MD, Stopped at 12/14/15 0847 .  vecuronium (NORCURON) injection 10 mg, 10 mg, Intravenous, Once, Charlesetta Shanks, MD, 10 mg at 12/13/15 1420  Facility-Administered Medications Ordered in Other Encounters:  .  sodium chloride 0.9 % injection 10 mL, 10 mL, Intracatheter, PRN, Curt Bears, MD, 10 mL at  01/29/15 0944   ROS:                                                                                                                                        Unable to obtain, patient intubated    Neurologic Examination:                                                                                                    Today's Vitals   12/14/15 2208 12/14/15 2211 12/14/15 2300 12/14/15 2339  BP: 72/57 148/65 121/57   Pulse: 95 103 87   Temp:    98.5 F (36.9 C)  TempSrc:    Oral  Resp:      Height:      Weight:      SpO2: 99% 98% 99%   PainSc:         patient intubated, drowsy due to propofol,  No gaze deviation or nystagmus noted. following simple commands to move his limbs voluntarily.   no abnormal involuntary movements noted.    Lab Results: Basic Metabolic Panel:  Recent Labs Lab 12/13/15 1310 12/13/15 1658 12/14/15 0304  NA 132* 129* 132*  K 4.3 3.8 4.8  CL 96* 98* 102  CO2 21* 22 24  GLUCOSE 124* 114* 147*  BUN '16 12 14  '$ CREATININE 0.93 0.73 1.00  CALCIUM 9.4 8.2* 7.9*  MG  --  1.9  --   PHOS  --  2.1*  --     Liver Function Tests:  Recent Labs Lab 12/13/15 1310 12/13/15 1658  AST 23 19  ALT 8* 9*  ALKPHOS 60 49  BILITOT 0.5 0.8  PROT 6.8 6.3*  ALBUMIN 3.9 3.3*   No results for input(s): LIPASE, AMYLASE in the last 168 hours. No results for input(s): AMMONIA in the last 168 hours.  CBC:  Recent Labs Lab 12/13/15 1310 12/13/15 1658 12/14/15 0304  WBC 11.4* 10.5 3.8*  NEUTROABS 4.3  --   --   HGB 13.0 11.6* 10.1*  HCT 41.1 35.9* 31.6*  MCV 101.0* 95.5 96.3  PLT 208 141* 132*    Cardiac  Enzymes:  Recent Labs Lab 12/13/15 1310  TROPONINI <0.03    Lipid Panel: No results for input(s): CHOL, TRIG, HDL, CHOLHDL, VLDL, LDLCALC in the last 168 hours.  CBG:  Recent Labs Lab 12/13/15 1716  GLUCAP 109*    Microbiology: Results for orders placed or performed during the hospital encounter of 12/13/15  Culture, blood (routine x 2)      Status: None (Preliminary result)   Collection Time: 12/13/15  5:04 PM  Result Value Ref Range Status   Specimen Description BLOOD RIGHT WRIST  Final   Special Requests IN PEDIATRIC BOTTLE 1CC  Final   Culture NO GROWTH < 24 HOURS  Final   Report Status PENDING  Incomplete  Culture, respiratory (tracheal aspirate)     Status: None (Preliminary result)   Collection Time: 12/13/15  5:07 PM  Result Value Ref Range Status   Specimen Description TRACHEAL ASPIRATE  Final   Special Requests Immunocompromised  Final   Gram Stain   Final    MODERATE WBC PRESENT,BOTH PMN AND MONONUCLEAR NO SQUAMOUS EPITHELIAL CELLS SEEN FEW GRAM POSITIVE COCCI IN CHAINS    Culture CULTURE REINCUBATED FOR BETTER GROWTH  Final   Report Status PENDING  Incomplete  Culture, blood (routine x 2)     Status: None (Preliminary result)   Collection Time: 12/13/15  5:12 PM  Result Value Ref Range Status   Specimen Description BLOOD BLOOD RIGHT HAND  Final   Special Requests IN PEDIATRIC BOTTLE .5CC  Final   Culture NO GROWTH < 24 HOURS  Final   Report Status PENDING  Incomplete  MRSA PCR Screening     Status: None   Collection Time: 12/13/15  5:19 PM  Result Value Ref Range Status   MRSA by PCR NEGATIVE NEGATIVE Final    Comment:        The GeneXpert MRSA Assay (FDA approved for NASAL specimens only), is one component of a comprehensive MRSA colonization surveillance program. It is not intended to diagnose MRSA infection nor to guide or monitor treatment for MRSA infections.   Urine culture     Status: None   Collection Time: 12/13/15  5:20 PM  Result Value Ref Range Status   Specimen Description URINE, CATHETERIZED  Final   Special Requests Immunocompromised  Final   Culture NO GROWTH  Final   Report Status 12/14/2015 FINAL  Final     Imaging: Ct Head Wo Contrast  12/13/2015  CLINICAL DATA:  Metastatic lung cancer to the brain, seizure activity EXAM: CT HEAD WITHOUT CONTRAST TECHNIQUE: Contiguous  axial images were obtained from the base of the skull through the vertex without intravenous contrast. COMPARISON:  10/06/2015, 06/25/2015 FINDINGS: Known high right parietal lesion is difficult to visualize without contrast. Compared to the prior brain MRI 10/06/2015, there does appear to be increased surrounding vasogenic edema and mass effect with localized sulcal effacement in the right parietal lobe. Vasogenic edema extends into the adjacent white matter in a larger pattern than the previous MRI. The other smaller brain lesions by MRI are not demonstrated by noncontrast CT. No acute intracranial hemorrhage, midline shift, herniation, hydrocephalus, or extra-axial fluid collection. Cisterns are patent. No cerebellar abnormality appreciated. Orbits are symmetric. Mastoids and sinuses remain clear.  Skull appears intact. Exam is limited with some motion artifact. IMPRESSION: Increased right parietal lobe cortical and white matter edema pattern about the known right parietal metastasis with localized mass effect and sulcal effacement. No associated intracranial hemorrhage Other known small brain metastases are  not well demonstrated by noncontrast imaging. No hydrocephalus or significant herniation. These results were called by telephone at the time of interpretation on 12/13/2015 at 2:22 pm to Dr. Charlesetta Shanks , who verbally acknowledged these results. Electronically Signed   By: Jerilynn Mages.  Shick M.D.   On: 12/13/2015 14:33   Mr Jeri Cos OV Contrast  12/14/2015  CLINICAL DATA:  Initial evaluation for acute left-sided weakness and seizure. History of metastatic disease. EXAM: MRI HEAD WITHOUT AND WITH CONTRAST TECHNIQUE: Multiplanar, multiecho pulse sequences of the brain and surrounding structures were obtained without and with intravenous contrast. CONTRAST:  62m MULTIHANCE GADOBENATE DIMEGLUMINE 529 MG/ML IV SOLN COMPARISON:  Prior CT from earlier the same day as well as previous MRI from 10/06/2015. FINDINGS:  Cerebral volume is stable. Mild confluent T2/FLAIR hyperintensity within the periventricular white matter most consistent with chronic small vessel ischemic disease. Chronic small vessel ischemic changes present within the pons as well. These changes are stable relative to prior. Metastatic lesion at the right parietal lobe is increased in size now measuring 2.1 x 2.4 x 2.2 cm (series 13, image 40, previously 1.1 x 1.3 x 1.2 cm). Surrounding vasogenic edema it is worsened relative to most recent MRI. No internal hemorrhage on gradient echo sequence. Additional lesion along the gray-white matter junction within the left frontal lobe is also increased in size, now measuring 8 mm (series 13, image 31), previously 5 mm). Surrounding edema about this lesion has also worsened. No interval hemorrhage. Additional small lesion along the anterior inferior left frontal convexity measuring 6 mm is stable the slightly increased in size (series 13, image 23). Additional faint lesion within the right cerebellar hemisphere measuring approximately 4 mm is not significantly changed. No definite new lesions. Overall, findings are consistent with progressive disease. No evidence for acute infarct. Major intracranial vascular flow voids are preserved. Gray-white matter differentiation otherwise maintained. No other changes related to seizure. No acute or chronic intracranial hemorrhage. No midline shift. Basilar cisterns are patent. No hydrocephalus. No extra-axial fluid collection. Major dural sinuses are grossly patent. Craniocervical junction normal. Visualized upper cervical spine unremarkable. Pituitary gland within normal limits. No acute abnormality about the orbits. Patient is status post lens extraction on the right. Scattered mucosal thickening within the paranasal sinuses with fluid level within the lateral recess of the right sphenoid sinus. Bilateral mastoid effusions. Fluid within the nasopharynx. Patient is likely  intubated. Bone marrow signal intensity within normal limits. No scalp soft tissue abnormality. IMPRESSION: 1. Overall interval progression of disease, with increased size of right parietal and left frontal lobe lesions as above with increased associated edema. 2. Additional smaller lesions within the anterior left frontal lobe and right cerebellum are overall not significantly changed in size. No definite new lesions identified. 3. No other acute intracranial process. Electronically Signed   By: BJeannine BogaM.D.   On: 12/14/2015 03:33   Dg Chest Port 1 View  12/13/2015  CLINICAL DATA:  Left-sided weakness, seizure activity, intubated EXAM: PORTABLE CHEST 1 VIEW COMPARISON:  06/20/2015 FINDINGS: Endotracheal tube 4.1 cm above the carina. Right IJ power port catheter tip mid SVC. Monitor leads and defibrillator pads noted across the chest. Postop changes from previous right upper lobectomy with dense apical consolidation on the right. Stable chronic volume loss in the right hemi thorax. No superimposed edema, significant collapse or consolidation. No pneumothorax. Aortic atherosclerosis noted. IMPRESSION: Endotracheal tube 4 cm above the carina. Otherwise stable postoperative appearance of the chest. No superimposed definite acute process.  Electronically Signed   By: Jerilynn Mages.  Shick M.D.   On: 12/13/2015 14:17     Assessment and plan:   [name] is an 75 y.o. 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 He is intubated, sedated propofol. Recommend starting Keppra 1500 mg twice a day. Also recommend Decadron 10 mg first dose was followed with 4 mg every 6 hours. MRI of the brain with and without contrast, and EEG ordered. CT of the head done today showed slightly increased right parietal metastatic lesion with increased edema compared to prior study from 10/06/2015.   We'll follow-up

## 2015-12-15 NOTE — Progress Notes (Signed)
EEG completed; results pending.    

## 2015-12-15 NOTE — Procedures (Signed)
History: 75 yo M with seizures from brain tumor  Sedation: None  Technique: This is a 21 channel routine scalp EEG performed at the bedside with bipolar and monopolar montages arranged in accordance to the international 10/20 system of electrode placement. One channel was dedicated to EKG recording.    Background: The background consists of intermixed alpha and beta activities. There is a well defined posterior dominant rhythm of 10 Hz that attenuates with eye opening. There are occasional sharp wave discharges seen in the left frontocentral region (F3, C3).  Photic stimulation: Physiologic driving is not performed  EEG Abnormalities: 1) left frontocentral sharp waves  Clinical Interpretation: This EEG is consistent with an area of potential epileptogenicity in the left frontocentral region. No seizure was recorded on this study.  Roland Rack, MD [facility] [phone]  If 7pm- 7am, please page neurology on call as listed in [place].

## 2015-12-15 NOTE — Progress Notes (Addendum)
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 He has been started on steroids and Keppra. Currently is doing well and would not change current management.   Recommendations: 1) Continue keppra '1500mg'$  BID 2) Wean steroids as tolerated.  3) No further inpatient recommendations At this time, please call if further questions or concerns remain.  Roland Rack, MD [facility] [phone]  If 7pm- 7am, please page neurology on call as listed in [place].

## 2015-12-15 NOTE — Progress Notes (Signed)
Arrived from ICU. Alert and oriented. Restless syndrome pain 5/10. Oriented to room. Call light within reach.

## 2015-12-15 NOTE — Telephone Encounter (Signed)
returned call adn s.w. pt and cx all appt due to pt in hospital...will call back when he gets out

## 2015-12-15 NOTE — Progress Notes (Signed)
Interval History:                                                                                                                      [name] is an 75 y.o. 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 He continued to wean well and was extubated this morning.  No further seizures since admission. He is on Keppra 1500 mg twice a day. MRI of the brain was done this morning, shows slight increase in the right parietal lesion with increased edema. He is also on Decadron. He is following commands without any problems. Mild left-sided weakness.     Past Medical History: Past Medical History  Diagnosis Date  . Arthritis   . Wheezing   . Sore throat   . Carotid artery occlusion   . COPD (chronic obstructive pulmonary disease) ([place])   . Lumbar disc disease   . Restless leg syndrome   . Allergic rhinitis   . Hypertension   . Anxiety     anxiety  . Shortness of breath   . Lung mass   . GERD (gastroesophageal reflux disease)   . H/O hiatal hernia   . Pre-operative cardiovascular examination   . Nonspecific abnormal unspecified cardiovascular function study   . Peripheral vascular disease, unspecified ([place])   . Pneumonia 2014    ?   Marland Kitchen On home oxygen therapy     prn  . Constipation   . 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 S/P radiation therapy 12/04/14 srs    lt frontal brain  . Cancer ([place]) 09/14/12    invasive mod diff squamous cell ca  . Metastasis to brain [facility]) 02/2015    Past Surgical History  Procedure Laterality Date  . Carotid endarterectomy  10/15/2010    left  . Hand surgery      repair of the left long, ring, and small finger after a saw injury  . Video bronchoscopy  07/24/2012    Procedure: VIDEO BRONCHOSCOPY WITH FLUORO;  Surgeon: Kathee Delton, MD;  Location: [facility];  Service: Cardiopulmonary;  Laterality: Bilateral;  . Lobectomy Right 09/14/2012    Procedure: LOBECTOMY;   Surgeon: Ivin Poot, MD;  Location: [place];  Service: Thoracic;  Laterality: Right;  RIGHT UPPER LOBECTOMY  . Video assisted thoracoscopy Right 09/14/2012    Procedure: VIDEO ASSISTED THORACOSCOPY;  Surgeon: Ivin Poot, MD;  Location: [place];  Service: Thoracic;  Laterality: Right;  . Video bronchoscopy with endobronchial ultrasound N/A 08/27/2014    Procedure: VIDEO BRONCHOSCOPY WITH ENDOBRONCHIAL ULTRASOUND;  Surgeon: Ivin Poot, MD;  Location: [place];  Service: Thoracic;  Laterality: N/A;  . Scalene node biopsy Right 08/27/2014    Procedure: BIOPSY SCALENE NODE;  Surgeon: Ivin Poot, MD;  Location: [place];  Service: Thoracic;  Laterality: Right;  . Sterotactic radiosurgery Right 09/25/14    right parietal 30Gy/1 fx  Family History: Family History  Problem Relation Age of Onset  . Heart disease Father     MI  . Heart attack Father   . Alcohol abuse Mother   . Stroke Brother   . Heart disease Brother   . Depression Sister   . Heart attack Brother     Social History:   reports that he quit smoking about 15 months ago. His smoking use included Cigarettes. He has a 55 pack-year smoking history. He quit smokeless tobacco use about 63 years ago. He reports that he drinks alcohol. He reports that he does not use illicit drugs.  Allergies:  No Known Allergies   Medications:                                                                                                                         Current facility-administered medications:  .  0.9 %  sodium chloride infusion, , Intravenous, Continuous, Praveen Mannam, MD, Last Rate: 10 mL/hr at 12/14/15 2107 .  antiseptic oral rinse (CPC / CETYLPYRIDINIUM CHLORIDE 0.05%) solution 7 mL, 7 mL, Mouth Rinse, q12n4p, Praveen Mannam, MD, 7 mL at 12/14/15 1721 .  chlorhexidine (PERIDEX) 0.12 % solution 15 mL, 15 mL, Mouth Rinse, BID, Praveen Mannam, MD, 15 mL at 12/14/15 2124 .  dexamethasone (DECADRON) injection 4 mg, 4 mg,  Intravenous, Q6H, Nigel Wessman Fuller Mandril, MD, 4 mg at 12/14/15 2307 .  famotidine (PEPCID) IVPB 20 mg premix, 20 mg, Intravenous, Q12H, William S Minor, NP, 20 mg at 12/14/15 2108 .  fentaNYL (SUBLIMAZE) injection 50 mcg, 50 mcg, Intravenous, Q15 min PRN, Grace Bushy Minor, NP, 50 mcg at 12/13/15 2355 .  fentaNYL (SUBLIMAZE) injection 50 mcg, 50 mcg, Intravenous, Q2H PRN, Grace Bushy Minor, NP, 50 mcg at 12/13/15 1955 .  ipratropium-albuterol (DUONEB) 0.5-2.5 (3) MG/3ML nebulizer solution 3 mL, 3 mL, Nebulization, Q6H, William S Minor, NP, 3 mL at 12/14/15 1917 .  levETIRAcetam (KEPPRA) 1,500 mg in sodium chloride 0.9 % 100 mL IVPB, 1,500 mg, Intravenous, Q12H, Inez Rosato Fuller Mandril, MD, 1,500 mg at 12/14/15 2308 .  midazolam (VERSED) injection 1 mg, 1 mg, Intravenous, Q15 min PRN, Grace Bushy Minor, NP .  midazolam (VERSED) injection 1 mg, 1 mg, Intravenous, Q2H PRN, Grace Bushy Minor, NP, 1 mg at 12/13/15 2344 .  phenylephrine (NEO-SYNEPHRINE) 10 mg in dextrose 5 % 250 mL (0.04 mg/mL) infusion, 0-200 mcg/min, Intravenous, Titrated, Mauri Brooklyn, MD, Stopped at 12/13/15 2234 .  propofol (DIPRIVAN) 1000 MG/100ML infusion, 5-70 mcg/kg/min, Intravenous, Titrated, Marshell Garfinkel, MD, Stopped at 12/14/15 0847 .  vecuronium (NORCURON) injection 10 mg, 10 mg, Intravenous, Once, Charlesetta Shanks, MD, 10 mg at 12/13/15 1420  Facility-Administered Medications Ordered in Other Encounters:  .  sodium chloride 0.9 % injection 10 mL, 10 mL, Intracatheter, PRN, Curt Bears, MD, 10 mL at 01/29/15 0944   Neurologic Examination:  Today's Vitals   12/14/15 2208 12/14/15 2211 12/14/15 2300 12/14/15 2339  BP: 72/57 148/65 121/57   Pulse: 95 103 87   Temp:    98.5 F (36.9 C)  TempSrc:    Oral  Resp:      Height:      Weight:      SpO2: 99% 98% 99%   PainSc:       Alert, oriented 4 normal speech, normal  cranial nerves, motor strength in the right side, mild weakness in the left is extremity lower extremity. No abnormal involuntary movements noted.     Lab Results: Basic Metabolic Panel:  Recent Labs Lab 12/13/15 1310 12/13/15 1658 12/14/15 0304  NA 132* 129* 132*  K 4.3 3.8 4.8  CL 96* 98* 102  CO2 21* 22 24  GLUCOSE 124* 114* 147*  BUN '16 12 14  '$ CREATININE 0.93 0.73 1.00  CALCIUM 9.4 8.2* 7.9*  MG  --  1.9  --   PHOS  --  2.1*  --     Liver Function Tests:  Recent Labs Lab 12/13/15 1310 12/13/15 1658  AST 23 19  ALT 8* 9*  ALKPHOS 60 49  BILITOT 0.5 0.8  PROT 6.8 6.3*  ALBUMIN 3.9 3.3*   No results for input(s): LIPASE, AMYLASE in the last 168 hours. No results for input(s): AMMONIA in the last 168 hours.  CBC:  Recent Labs Lab 12/13/15 1310 12/13/15 1658 12/14/15 0304  WBC 11.4* 10.5 3.8*  NEUTROABS 4.3  --   --   HGB 13.0 11.6* 10.1*  HCT 41.1 35.9* 31.6*  MCV 101.0* 95.5 96.3  PLT 208 141* 132*    Cardiac Enzymes:  Recent Labs Lab 12/13/15 1310  TROPONINI <0.03    Lipid Panel: No results for input(s): CHOL, TRIG, HDL, CHOLHDL, VLDL, LDLCALC in the last 168 hours.  CBG:  Recent Labs Lab 12/13/15 1716  GLUCAP 109*    Microbiology: Results for orders placed or performed during the hospital encounter of 12/13/15  Culture, blood (routine x 2)     Status: None (Preliminary result)   Collection Time: 12/13/15  5:04 PM  Result Value Ref Range Status   Specimen Description BLOOD RIGHT WRIST  Final   Special Requests IN PEDIATRIC BOTTLE 1CC  Final   Culture NO GROWTH < 24 HOURS  Final   Report Status PENDING  Incomplete  Culture, respiratory (tracheal aspirate)     Status: None (Preliminary result)   Collection Time: 12/13/15  5:07 PM  Result Value Ref Range Status   Specimen Description TRACHEAL ASPIRATE  Final   Special Requests Immunocompromised  Final   Gram Stain   Final    MODERATE WBC PRESENT,BOTH PMN AND MONONUCLEAR NO  SQUAMOUS EPITHELIAL CELLS SEEN FEW GRAM POSITIVE COCCI IN CHAINS    Culture CULTURE REINCUBATED FOR BETTER GROWTH  Final   Report Status PENDING  Incomplete  Culture, blood (routine x 2)     Status: None (Preliminary result)   Collection Time: 12/13/15  5:12 PM  Result Value Ref Range Status   Specimen Description BLOOD BLOOD RIGHT HAND  Final   Special Requests IN PEDIATRIC BOTTLE .5CC  Final   Culture NO GROWTH < 24 HOURS  Final   Report Status PENDING  Incomplete  MRSA PCR Screening     Status: None   Collection Time: 12/13/15  5:19 PM  Result Value Ref Range Status   MRSA by PCR NEGATIVE NEGATIVE Final    Comment:  The GeneXpert MRSA Assay (FDA approved for NASAL specimens only), is one component of a comprehensive MRSA colonization surveillance program. It is not intended to diagnose MRSA infection nor to guide or monitor treatment for MRSA infections.   Urine culture     Status: None   Collection Time: 12/13/15  5:20 PM  Result Value Ref Range Status   Specimen Description URINE, CATHETERIZED  Final   Special Requests Immunocompromised  Final   Culture NO GROWTH  Final   Report Status 12/14/2015 FINAL  Final    Imaging: Ct Head Wo Contrast  12/13/2015  CLINICAL DATA:  Metastatic lung cancer to the brain, seizure activity EXAM: CT HEAD WITHOUT CONTRAST TECHNIQUE: Contiguous axial images were obtained from the base of the skull through the vertex without intravenous contrast. COMPARISON:  10/06/2015, 06/25/2015 FINDINGS: Known high right parietal lesion is difficult to visualize without contrast. Compared to the prior brain MRI 10/06/2015, there does appear to be increased surrounding vasogenic edema and mass effect with localized sulcal effacement in the right parietal lobe. Vasogenic edema extends into the adjacent white matter in a larger pattern than the previous MRI. The other smaller brain lesions by MRI are not demonstrated by noncontrast CT. No acute  intracranial hemorrhage, midline shift, herniation, hydrocephalus, or extra-axial fluid collection. Cisterns are patent. No cerebellar abnormality appreciated. Orbits are symmetric. Mastoids and sinuses remain clear.  Skull appears intact. Exam is limited with some motion artifact. IMPRESSION: Increased right parietal lobe cortical and white matter edema pattern about the known right parietal metastasis with localized mass effect and sulcal effacement. No associated intracranial hemorrhage Other known small brain metastases are not well demonstrated by noncontrast imaging. No hydrocephalus or significant herniation. These results were called by telephone at the time of interpretation on 12/13/2015 at 2:22 pm to Dr. Charlesetta Shanks , who verbally acknowledged these results. Electronically Signed   By: Jerilynn Mages.  Shick M.D.   On: 12/13/2015 14:33   Mr Jeri Cos XL Contrast  12/14/2015  CLINICAL DATA:  Initial evaluation for acute left-sided weakness and seizure. History of metastatic disease. EXAM: MRI HEAD WITHOUT AND WITH CONTRAST TECHNIQUE: Multiplanar, multiecho pulse sequences of the brain and surrounding structures were obtained without and with intravenous contrast. CONTRAST:  27m MULTIHANCE GADOBENATE DIMEGLUMINE 529 MG/ML IV SOLN COMPARISON:  Prior CT from earlier the same day as well as previous MRI from 10/06/2015. FINDINGS: Cerebral volume is stable. Mild confluent T2/FLAIR hyperintensity within the periventricular white matter most consistent with chronic small vessel ischemic disease. Chronic small vessel ischemic changes present within the pons as well. These changes are stable relative to prior. Metastatic lesion at the right parietal lobe is increased in size now measuring 2.1 x 2.4 x 2.2 cm (series 13, image 40, previously 1.1 x 1.3 x 1.2 cm). Surrounding vasogenic edema it is worsened relative to most recent MRI. No internal hemorrhage on gradient echo sequence. Additional lesion along the gray-white matter  junction within the left frontal lobe is also increased in size, now measuring 8 mm (series 13, image 31), previously 5 mm). Surrounding edema about this lesion has also worsened. No interval hemorrhage. Additional small lesion along the anterior inferior left frontal convexity measuring 6 mm is stable the slightly increased in size (series 13, image 23). Additional faint lesion within the right cerebellar hemisphere measuring approximately 4 mm is not significantly changed. No definite new lesions. Overall, findings are consistent with progressive disease. No evidence for acute infarct. Major intracranial vascular flow voids are preserved.  Gray-white matter differentiation otherwise maintained. No other changes related to seizure. No acute or chronic intracranial hemorrhage. No midline shift. Basilar cisterns are patent. No hydrocephalus. No extra-axial fluid collection. Major dural sinuses are grossly patent. Craniocervical junction normal. Visualized upper cervical spine unremarkable. Pituitary gland within normal limits. No acute abnormality about the orbits. Patient is status post lens extraction on the right. Scattered mucosal thickening within the paranasal sinuses with fluid level within the lateral recess of the right sphenoid sinus. Bilateral mastoid effusions. Fluid within the nasopharynx. Patient is likely intubated. Bone marrow signal intensity within normal limits. No scalp soft tissue abnormality. IMPRESSION: 1. Overall interval progression of disease, with increased size of right parietal and left frontal lobe lesions as above with increased associated edema. 2. Additional smaller lesions within the anterior left frontal lobe and right cerebellum are overall not significantly changed in size. No definite new lesions identified. 3. No other acute intracranial process. Electronically Signed   By: Jeannine Boga M.D.   On: 12/14/2015 03:33   Dg Chest Port 1 View  12/13/2015  CLINICAL DATA:   Left-sided weakness, seizure activity, intubated EXAM: PORTABLE CHEST 1 VIEW COMPARISON:  06/20/2015 FINDINGS: Endotracheal tube 4.1 cm above the carina. Right IJ power port catheter tip mid SVC. Monitor leads and defibrillator pads noted across the chest. Postop changes from previous right upper lobectomy with dense apical consolidation on the right. Stable chronic volume loss in the right hemi thorax. No superimposed edema, significant collapse or consolidation. No pneumothorax. Aortic atherosclerosis noted. IMPRESSION: Endotracheal tube 4 cm above the carina. Otherwise stable postoperative appearance of the chest. No superimposed definite acute process. Electronically Signed   By: Jerilynn Mages.  Shick M.D.   On: 12/13/2015 14:17    Assessment and plan:   [name] is an 75 y.o. 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 He continued to wean well and was extubated this morning.  No further seizures since admission. He is on Keppra 1500 mg twice a day. MRI of the brain was done this morning, shows slight increase in the right parietal lesion with increased edema. He is also on Decadron. He is following commands without any problems. Mild left-sided weakness.  Reviewed brain MRI images with the patient and family, explained the findings. At this point, he appears stable from neurological standpoint, continue same dose of Keppra. Follow-up with oncology with regard to the metastatic lesion.   Dr. Leonel Ramsay will continue to follow starting tomorrow morning .

## 2015-12-15 NOTE — Progress Notes (Signed)
Pt alert and oriented. VS stable. Upon assessment hard and tender area noted at the site of lab draw site. Peripheral IV below this area is removed. Warm compress applied. Pt states: "It helps"

## 2015-12-15 NOTE — Telephone Encounter (Signed)
Returning wife's call. She called to let us know pt is in ICU at [facility] since Saturday. The CT and MRI brain show increase in tumor and the MDs think this may be causing seizures. He will not be making his appt on Wed.

## 2015-12-15 NOTE — Care Management Important Message (Signed)
Important Message  Patient Details  Name: [name] MRN: [id number] Date of Birth: 09-20-1940   Medicare Important Message Given:  Yes    Nathen May 12/15/2015, 11:46 AM

## 2015-12-15 NOTE — Progress Notes (Signed)
PULMONARY / CRITICAL CARE MEDICINE   Name: [name] MRN: [id number] DOB: 1941-04-12    ADMISSION DATE:  12/13/2015   REFERRING MD:  EDP  CHIEF COMPLAINT:  AMS  HISTORY OF PRESENT ILLNESS:   75 yo with recurrent lung cancer and brain mets who developed left sided weakness and szs 6/3. Intubated and transported to [facility] to [facility] service Dx with squamous cell lung cancer 2013 per [facility] by Dr. Gwenette Greet. Tx with chemo per [name] , rt upper lobe resection per Nils Pyle. He developed reoccurrence of cancer 2016 with brain mets. Treated with chemo and RTX .  He presented [facility] 6/3 with let sided weakness, tonic clonic sz in med center and intubated.   SUBJECTIVE:  Extubated yesterday.  Doing well.  Eating breakfast.  No further seizures.   VITAL SIGNS: BP 156/74 mmHg  Pulse 91  Temp(Src) 98.1 F (36.7 C) (Oral)  Resp 24  Ht 5' 9.02" (1.753 m)  Wt 80.151 kg (176 lb 11.2 oz)  BMI 26.08 kg/m2  SpO2 98%  HEMODYNAMICS:    VENTILATOR SETTINGS:    INTAKE / OUTPUT: I/O last 3 completed shifts: In: 3787.1 [P.O.:480; I.V.:2812.1; IV Piggyback:495] Out: 5681 [EXNTZ:0017]  PHYSICAL EXAMINATION: General: Awake, no distress Neuro:  More movement on left side. HEENT:  ETT in place. Moist mucus membranes. Cardiovascular:  RRR, No MRG Lungs: Clear, no wheeze or crackles Abdomen: Soft + BS Musculoskeletal:  Intact. Skin:  Warm and dry  LABS:  BMET  Recent Labs Lab 12/13/15 1658 12/14/15 0304 12/15/15 0300  NA 129* 132* 132*  K 3.8 4.8 4.8  CL 98* 102 103  CO2 '22 24 24  '$ BUN 12 14 23*  CREATININE 0.73 1.00 1.14  GLUCOSE 114* 147* 147*    Electrolytes  Recent Labs Lab 12/13/15 1658 12/14/15 0304 12/15/15 0300  CALCIUM 8.2* 7.9* 8.1*  MG 1.9  --   --   PHOS 2.1*  --   --     CBC  Recent Labs Lab 12/13/15 1658 12/14/15 0304 12/15/15 0300  WBC 10.5 3.8* 5.9  HGB 11.6* 10.1* 10.1*  HCT 35.9* 31.6* 31.9*  PLT 141* 132* 151    Coag's  Recent  Labs Lab 12/13/15 1329 12/13/15 1658  APTT 32 26  INR 0.92 0.99    Sepsis Markers  Recent Labs Lab 12/13/15 1658  LATICACIDVEN 0.9  PROCALCITON <0.10    ABG  Recent Labs Lab 12/13/15 1713 12/14/15 0500  PHART 7.362 7.304*  PCO2ART 47.7* 48.4*  PO2ART 211.0* 118*    Liver Enzymes  Recent Labs Lab 12/13/15 1310 12/13/15 1658  AST 23 19  ALT 8* 9*  ALKPHOS 60 49  BILITOT 0.5 0.8  ALBUMIN 3.9 3.3*    Cardiac Enzymes  Recent Labs Lab 12/13/15 1310  TROPONINI <0.03    Glucose  Recent Labs Lab 12/13/15 1716  GLUCAP 109*    Imaging No results found.  STUDIES:  Ct HEAD AS NOTED 6/3 MRI 6/4 > progression of brain mets with edema.  CULTURES: 6/3 bc x 2>> 6/3 uc>>neg 6/3 sputum>>  ANTIBIOTICS: None  SIGNIFICANT EVENTS: 6/3 szs left sided hemiplegia  LINES/TUBES: 6/3 ett>>6/5 Prtacath>>  DISCUSSION: 75 yo with recurrent lung cancer and brain mets with new onset seizure r/t progression of metastatic lesions.  Required intubation initially, extubated 6/5.     ASSESSMENT / PLAN:  Intubated for airway protection COPD Recurrent metastatic Lung cancer - squamous cell (first dx 2013) s/p chemo and RULobectomy  P:   Pulmonary hygiene  duoneb  F/u CXR  Will ask onc to see    Brief hypotension r/t propofol - resolved.  HTN P:  PO intake  Resume home norvasc, metoprolol, isosorbide    No overt infectious process P:   Pan culture Observe off antibiotics     Seizures - in setting progression of brain mets with edema P:   AED's per neuro  Neuro following  Will ask oncology to see inpt    FAMILY  - Updates: pt and wife updated bedside 6/5. - Inter-disciplinary family meet or Palliative Care meeting due by: June 13  tx floor, will ask triad to assume care 6/6  Nickolas Madrid, NP 12/15/2015  10:29 AM Pager: [phone] or [phone]  Attending note: I have seen and examined the patient with nurse  practitioner/resident and agree with the note. History, labs and imaging reviewed.  Stable post extubation with no more seizures. AED and steroids per neurology. We will let radiation oncology know that he is admitted.  OK for transfer from ICU.  Marshell Garfinkel MD [facility] Pager [phone] If no answer or after 3pm call: [phone] 12/15/2015, 11:40 AM

## 2015-12-16 ENCOUNTER — Telehealth: Payer: Self-pay | Admitting: Internal Medicine

## 2015-12-16 DIAGNOSIS — I10 Essential (primary) hypertension: Secondary | ICD-10-CM | POA: Insufficient documentation

## 2015-12-16 DIAGNOSIS — R569 Unspecified convulsions: Secondary | ICD-10-CM | POA: Insufficient documentation

## 2015-12-16 DIAGNOSIS — J9600 Acute respiratory failure, unspecified whether with hypoxia or hypercapnia: Secondary | ICD-10-CM

## 2015-12-16 DIAGNOSIS — C3490 Malignant neoplasm of unspecified part of unspecified bronchus or lung: Secondary | ICD-10-CM | POA: Insufficient documentation

## 2015-12-16 LAB — I-STAT ARTERIAL BLOOD GAS, ED
Acid-base deficit: 1 mmol/L (ref 0.0–2.0)
BICARBONATE: 27.4 meq/L — AB (ref 20.0–24.0)
O2 SAT: 100 %
PCO2 ART: 63.9 mmHg — AB (ref 35.0–45.0)
PO2 ART: 624 mmHg — AB (ref 80.0–100.0)
Patient temperature: 36.1
TCO2: 29 mmol/L (ref 0–100)
pH, Arterial: 7.235 — ABNORMAL LOW (ref 7.350–7.450)

## 2015-12-16 LAB — CULTURE, RESPIRATORY W GRAM STAIN: Culture: NORMAL

## 2015-12-16 LAB — BASIC METABOLIC PANEL
Anion gap: 8 (ref 5–15)
BUN: 21 mg/dL — AB (ref 6–20)
CHLORIDE: 103 mmol/L (ref 101–111)
CO2: 23 mmol/L (ref 22–32)
CREATININE: 0.84 mg/dL (ref 0.61–1.24)
Calcium: 8.3 mg/dL — ABNORMAL LOW (ref 8.9–10.3)
GFR calc Af Amer: >60 mL/min (ref >60)
GFR calc non Af Amer: >60 mL/min (ref >60)
GLUCOSE: 114 mg/dL — AB (ref 65–99)
POTASSIUM: 4.9 mmol/L (ref 3.5–5.1)
SODIUM: 134 mmol/L — AB (ref 135–145)

## 2015-12-16 LAB — CULTURE, RESPIRATORY

## 2015-12-16 MED ORDER — LEVETIRACETAM 750 MG PO TABS
1500.0000 mg | ORAL_TABLET | Freq: Two times a day (BID) | ORAL | Status: DC
  Administered 2015-12-16: 1500 mg via ORAL
  Filled 2015-12-16: qty 2

## 2015-12-16 MED ORDER — LEVETIRACETAM 750 MG PO TABS: 1500.0000 mg | ORAL_TABLET | Freq: Two times a day (BID) | ORAL | Status: DC

## 2015-12-16 MED ORDER — DEXAMETHASONE 4 MG PO TABS: 4.0000 mg | ORAL_TABLET | Freq: Four times a day (QID) | ORAL | Status: DC

## 2015-12-16 MED ORDER — IPRATROPIUM-ALBUTEROL 0.5-2.5 (3) MG/3ML IN SOLN
3.0000 mL | Freq: Three times a day (TID) | RESPIRATORY_TRACT | Status: DC
  Administered 2015-12-16: 3 mL via RESPIRATORY_TRACT
  Filled 2015-12-16: qty 3

## 2015-12-16 NOTE — Discharge Summary (Signed)
Physician Discharge Summary  [name] YBO:[id number] DOB: [birth year] DOA: 12/13/2015  PCP: Beatris Si  Admit date: 12/13/2015 Discharge date: 12/16/2015  Time spent: 35 minutes  Recommendations for Outpatient Follow-up:  Adjust/slowly tapered decadron dose as tolerated (for radiation oncology) Please arrange follow up with neurology for further adjustments and follow up on antiepileptic drugs and seizure  Discharge Diagnoses:  Active Problems:   Left hemiparesis ([place])   Respiratory failure (HCC)   Brain metastases (HCC)   Acute left hemiparesis (HCC)   History of lung cancer   Discharge Condition: stable and improved. Discharge home with instructions to follow up with PCP, oncology and radiation oncology service.  Diet recommendation: heart healthy diet   Filed Weights   12/14/15 0430 12/15/15 2230 12/16/15 0500  Weight: 80.151 kg (176 lb 11.2 oz) 81.557 kg (179 lb 12.8 oz) 82.419 kg (181 lb 11.2 oz)    History of present illness:  As per H&P written by Dr. Vaughan Browner on 12/13/15 75 yo with recurrent lung cancer and brain mets who developed left sided weakness and szs 6/3. Intubated and transported to [facility] to [facility] service Dx with squamous cell lung cancer 2013 per FOB by Dr. Gwenette Greet. Tx with chemo per [name] , rt upper lobe resection per Nils Pyle. He developed reoccurrence of cancer 2016 with brain mets. Treated with chemo and RTX. He presented [facility] 6/3 with let sided weakness, tonic clonic sz in med center and intubated.   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 -controlled with use of keppra 1500 mg BID -no further seizure appreciated and patient in stable condition  -will follow up with neurology as an outpatient and also with oncology to determine further potential treatment  -required intubation for airway protection during seizure; no with good air movement and stable O2 sat on chronic 2L  2-chronic resp failure: due to  lung cancer and COPD -continue home inhaler regimen -continue oxygen supplementation -outpatient follow up with Oncology   3-essential HTN: -continue metoprolol, Imdur and Norvasc  4-lung cancer and brain metastasis -outpatient follow up with oncology and radiation oncology -will continue decadron PO; dose adjustment to be decided/tapered by radiation oncology service   5-insomnia: -continue zolpidem   6-anxiety: -continue PRN klonopin   7-GERD: continue prilosec   Procedures:  EEG: consistent with an area of potential epileptogenicity in the left frontocentral region. No seizure was recorded on this study.  Consultations:  PCCM  Neurology  Discharge Exam: Filed Vitals:   12/16/15 0649 12/16/15 1051  BP: 146/72 155/78  Pulse: 68 81  Temp: 97.9 F (36.6 C) 98.2 F (36.8 C)  Resp: 18 18   General: Awake, no distress; able to follow commands and oriented X3. [place] in place and denying CP or SOB Neuro: mild left hemiparesis; no needing HH services or SNf as per PT/OT eval Cardiovascular: RRR, No MRG, S1and S2 Lungs: good air movement, no wheeze or crackles; scattered rhonchi appreciated Abdomen: Soft + BS Musculoskeletal: Intact. Skin: Warm and dry  Discharge Instructions   Discharge Instructions    Ambulatory referral to Neurology    Complete by:  As directed   An appointment is requested in approximately: 2 weeks     Diet - low sodium heart healthy    Complete by:  As directed      Discharge instructions    Complete by:  As directed   Take medication as prescribed Keep yourself well hydrated Follow up with oncology and radiation  oncology (call office for appointment details)          Current Discharge Medication List    START taking these medications   Details  dexamethasone (DECADRON) 4 MG tablet Take 1 tablet (4 mg total) by mouth 4 (four) times daily. Qty: 60 tablet, Refills: 0    levETIRAcetam (KEPPRA) 750 MG tablet Take 2 tablets (1,500 mg  total) by mouth 2 (two) times daily. Qty: 60 tablet, Refills: 1      CONTINUE these medications which have NOT CHANGED   Details  amLODipine (NORVASC) 5 MG tablet Take 1 tablet (5 mg total) by mouth daily. May take an additional tablet as needed for elevated blood pressure. Qty: 45 tablet, Refills: 11    aspirin EC 81 MG tablet Take 81 mg by mouth daily.     clonazePAM (KLONOPIN) 0.5 MG tablet Take 0.5 mg by mouth 2 (two) times daily as needed for anxiety. Takes one tablet by mouth two times daily as needed for anxiety.    furosemide (LASIX) 20 MG tablet Change to Lasix 20 mg BID x 7 days; then return to 20 mg Q AM. Qty: 45 tablet, Refills: 0    guaifenesin (HUMIBID E) 400 MG TABS tablet Take 400 mg by mouth 2 (two) times daily.    HYDROcodone-acetaminophen (NORCO) 7.5-325 MG tablet Take 1 tablet by mouth every 6 (six) hours as needed for moderate pain. Qty: 45 tablet, Refills: 0   Associated Diagnoses: Personal history of malignant neoplasm of bronchus and lung; Brain metastasis ([place]); Mixed simple and mucopurulent chronic bronchitis (HCC)    ipratropium-albuterol (DUONEB) 0.5-2.5 (3) MG/3ML SOLN Take 3 mLs by nebulization 4 (four) times daily. Qty: 360 mL, Refills: 2    isosorbide mononitrate (IMDUR) 30 MG 24 hr tablet Take 1 tablet (30 mg total) by mouth daily. Qty: 30 tablet, Refills: 4    lisinopril (PRINIVIL,ZESTRIL) 40 MG tablet Take 40 mg by mouth at bedtime.     metoprolol tartrate (LOPRESSOR) 25 MG tablet Take 25 mg by mouth 2 (two) times daily.     omeprazole (PRILOSEC) 40 MG capsule Take 40 mg by mouth daily.    OXYGEN Inhale 2-3 L into the lungs continuous.    pentoxifylline (TRENTAL) 400 MG CR tablet Take 1 tablet (400 mg total) by mouth 3 (three) times daily. Qty: 90 tablet, Refills: 12    pramipexole (MIRAPEX) 1 MG tablet Take 1 mg by mouth at bedtime.    prochlorperazine (COMPAZINE) 10 MG tablet Take 10 mg by mouth every 6 (six) hours as needed for nausea or  vomiting. Reported on 09/25/2015    traMADol (ULTRAM) 50 MG tablet Take 1 tablet (50 mg total) by mouth every 12 (twelve) hours as needed. Qty: 30 tablet, Refills: 0    triamcinolone cream (KENALOG) 0.1 % Apply 1 application topically 2 (two) times daily as needed (itching/ dry skin).     vitamin E (VITAMIN E) 1000 UNIT capsule Take 1 capsule (1,000 Units total) by mouth daily. Qty: 30 capsule, Refills: 12    zolpidem (AMBIEN) 10 MG tablet Take 10 mg by mouth at bedtime as needed for sleep. Reported on 09/25/2015       No Known Allergies Follow-up Information    Follow up with HEPLER,MARK, PA-C In 10 days.   Specialty:  Physician Assistant   Contact information:   [place]  [id number] [phone]       Follow up with Eilleen Kempf., MD.   Specialty:  Oncology   Why:  call office for appointment details    Contact information:   [place] [address] [phone]       Follow up with Kyung Rudd, MD.   Specialty:  Radiation Oncology   Why:  call office for appointment details    Contact information:   [place] [place] [address] [phone]       The results of significant diagnostics from this hospitalization (including imaging, microbiology, ancillary and laboratory) are listed below for reference.    Significant Diagnostic Studies: Ct Head Wo Contrast  12/13/2015  CLINICAL DATA:  Metastatic lung cancer to the brain, seizure activity EXAM: CT HEAD WITHOUT CONTRAST TECHNIQUE: Contiguous axial images were obtained from the base of the skull through the vertex without intravenous contrast. COMPARISON:  10/06/2015, 06/25/2015 FINDINGS: Known high right parietal lesion is difficult to visualize without contrast. Compared to the prior brain MRI 10/06/2015, there does appear to be increased surrounding vasogenic edema and mass effect with localized sulcal effacement in the right parietal lobe. Vasogenic edema extends into  the adjacent white matter in a larger pattern than the previous MRI. The other smaller brain lesions by MRI are not demonstrated by noncontrast CT. No acute intracranial hemorrhage, midline shift, herniation, hydrocephalus, or extra-axial fluid collection. Cisterns are patent. No cerebellar abnormality appreciated. Orbits are symmetric. Mastoids and sinuses remain clear.  Skull appears intact. Exam is limited with some motion artifact. IMPRESSION: Increased right parietal lobe cortical and white matter edema pattern about the known right parietal metastasis with localized mass effect and sulcal effacement. No associated intracranial hemorrhage Other known small brain metastases are not well demonstrated by noncontrast imaging. No hydrocephalus or significant herniation. These results were called by telephone at the time of interpretation on 12/13/2015 at 2:22 pm to Dr. Charlesetta Shanks , who verbally acknowledged these results. Electronically Signed   By: Jerilynn Mages.  Shick M.D.   On: 12/13/2015 14:33   Mr Jeri Cos WN Contrast  12/14/2015  CLINICAL DATA:  Initial evaluation for acute left-sided weakness and seizure. History of metastatic disease. EXAM: MRI HEAD WITHOUT AND WITH CONTRAST TECHNIQUE: Multiplanar, multiecho pulse sequences of the brain and surrounding structures were obtained without and with intravenous contrast. CONTRAST:  80m MULTIHANCE GADOBENATE DIMEGLUMINE 529 MG/ML IV SOLN COMPARISON:  Prior CT from earlier the same day as well as previous MRI from 10/06/2015. FINDINGS: Cerebral volume is stable. Mild confluent T2/FLAIR hyperintensity within the periventricular white matter most consistent with chronic small vessel ischemic disease. Chronic small vessel ischemic changes present within the pons as well. These changes are stable relative to prior. Metastatic lesion at the right parietal lobe is increased in size now measuring 2.1 x 2.4 x 2.2 cm (series 13, image 40, previously 1.1 x 1.3 x 1.2 cm). Surrounding  vasogenic edema it is worsened relative to most recent MRI. No internal hemorrhage on gradient echo sequence. Additional lesion along the gray-white matter junction within the left frontal lobe is also increased in size, now measuring 8 mm (series 13, image 31), previously 5 mm). Surrounding edema about this lesion has also worsened. No interval hemorrhage. Additional small lesion along the anterior inferior left frontal convexity measuring 6 mm is stable the slightly increased in size (series 13, image 23). Additional faint lesion within the right cerebellar hemisphere measuring approximately 4 mm is not significantly changed. No definite new lesions. Overall, findings are consistent with progressive disease. No evidence for acute infarct. Major intracranial vascular flow  voids are preserved. Gray-white matter differentiation otherwise maintained. No other changes related to seizure. No acute or chronic intracranial hemorrhage. No midline shift. Basilar cisterns are patent. No hydrocephalus. No extra-axial fluid collection. Major dural sinuses are grossly patent. Craniocervical junction normal. Visualized upper cervical spine unremarkable. Pituitary gland within normal limits. No acute abnormality about the orbits. Patient is status post lens extraction on the right. Scattered mucosal thickening within the paranasal sinuses with fluid level within the lateral recess of the right sphenoid sinus. Bilateral mastoid effusions. Fluid within the nasopharynx. Patient is likely intubated. Bone marrow signal intensity within normal limits. No scalp soft tissue abnormality. IMPRESSION: 1. Overall interval progression of disease, with increased size of right parietal and left frontal lobe lesions as above with increased associated edema. 2. Additional smaller lesions within the anterior left frontal lobe and right cerebellum are overall not significantly changed in size. No definite new lesions identified. 3. No other acute  intracranial process. Electronically Signed   By: Jeannine Boga M.D.   On: 12/14/2015 03:33   Dg Chest Port 1 View  12/13/2015  CLINICAL DATA:  Left-sided weakness, seizure activity, intubated EXAM: PORTABLE CHEST 1 VIEW COMPARISON:  06/20/2015 FINDINGS: Endotracheal tube 4.1 cm above the carina. Right IJ power port catheter tip mid SVC. Monitor leads and defibrillator pads noted across the chest. Postop changes from previous right upper lobectomy with dense apical consolidation on the right. Stable chronic volume loss in the right hemi thorax. No superimposed edema, significant collapse or consolidation. No pneumothorax. Aortic atherosclerosis noted. IMPRESSION: Endotracheal tube 4 cm above the carina. Otherwise stable postoperative appearance of the chest. No superimposed definite acute process. Electronically Signed   By: Jerilynn Mages.  Shick M.D.   On: 12/13/2015 14:17    Microbiology: Recent Results (from the past 240 hour(s))  Culture, blood (routine x 2)     Status: None (Preliminary result)   Collection Time: 12/13/15  5:04 PM  Result Value Ref Range Status   Specimen Description BLOOD RIGHT WRIST  Final   Special Requests IN PEDIATRIC BOTTLE 1CC  Final   Culture NO GROWTH 2 DAYS  Final   Report Status PENDING  Incomplete  Culture, respiratory (tracheal aspirate)     Status: None (Preliminary result)   Collection Time: 12/13/15  5:07 PM  Result Value Ref Range Status   Specimen Description TRACHEAL ASPIRATE  Final   Special Requests Immunocompromised  Final   Gram Stain   Final    MODERATE WBC PRESENT,BOTH PMN AND MONONUCLEAR NO SQUAMOUS EPITHELIAL CELLS SEEN FEW GRAM POSITIVE COCCI IN CHAINS    Culture CULTURE REINCUBATED FOR BETTER GROWTH  Final   Report Status PENDING  Incomplete  Culture, blood (routine x 2)     Status: None (Preliminary result)   Collection Time: 12/13/15  5:12 PM  Result Value Ref Range Status   Specimen Description BLOOD BLOOD RIGHT HAND  Final   Special  Requests IN PEDIATRIC BOTTLE .5CC  Final   Culture NO GROWTH 2 DAYS  Final   Report Status PENDING  Incomplete  MRSA PCR Screening     Status: None   Collection Time: 12/13/15  5:19 PM  Result Value Ref Range Status   MRSA by PCR NEGATIVE NEGATIVE Final    Comment:        The GeneXpert MRSA Assay (FDA approved for NASAL specimens only), is one component of a comprehensive MRSA colonization surveillance program. It is not intended to diagnose MRSA infection nor to guide or  monitor treatment for MRSA infections.   Urine culture     Status: None   Collection Time: 12/13/15  5:20 PM  Result Value Ref Range Status   Specimen Description URINE, CATHETERIZED  Final   Special Requests Immunocompromised  Final   Culture NO GROWTH  Final   Report Status 12/14/2015 FINAL  Final     Labs: Basic Metabolic Panel:  Recent Labs Lab 12/13/15 1310 12/13/15 1658 12/14/15 0304 12/15/15 0300 12/16/15 0529  NA 132* 129* 132* 132* 134*  K 4.3 3.8 4.8 4.8 4.9  CL 96* 98* 102 103 103  CO2 21* '22 24 24 23  '$ GLUCOSE 124* 114* 147* 147* 114*  BUN '16 12 14 '$ 23* 21*  CREATININE 0.93 0.73 1.00 1.14 0.84  CALCIUM 9.4 8.2* 7.9* 8.1* 8.3*  MG  --  1.9  --   --   --   PHOS  --  2.1*  --   --   --    Liver Function Tests:  Recent Labs Lab 12/13/15 1310 12/13/15 1658  AST 23 19  ALT 8* 9*  ALKPHOS 60 49  BILITOT 0.5 0.8  PROT 6.8 6.3*  ALBUMIN 3.9 3.3*   CBC:  Recent Labs Lab 12/13/15 1310 12/13/15 1658 12/14/15 0304 12/15/15 0300  WBC 11.4* 10.5 3.8* 5.9  NEUTROABS 4.3  --   --   --   HGB 13.0 11.6* 10.1* 10.1*  HCT 41.1 35.9* 31.6* 31.9*  MCV 101.0* 95.5 96.3 96.4  PLT 208 141* 132* 151   Cardiac Enzymes:  Recent Labs Lab 12/13/15 1310  TROPONINI <0.03   BNP: BNP (last 3 results)  Recent Labs  06/20/15 0920  BNP 156.6*   CBG:  Recent Labs Lab 12/13/15 1716  GLUCAP 109*    Signed:  Barton Dubois MD.  [facility] 12/16/2015, 1:18 PM

## 2015-12-16 NOTE — Evaluation (Signed)
Physical Therapy Evaluation Patient Details Name: [name] MRN: [id number] DOB: 11/19/1940 Today's Date: 12/16/2015   History of Present Illness  75 yo with recurrent lung cancer and brain mets who developed left sided weakness and szs 6/3.PMH of squamous cell lung Ca, COPD, Restless leg syndrome, anxiety.  Clinical Impression  Pt admitted with above. Pt with generalized weakness and impaired balance. Pt to benefit from outpt PT to address mentioned deficits.    Follow Up Recommendations Outpatient PT;Supervision/Assistance - 24 hour    Equipment Recommendations  None recommended by PT    Recommendations for Other Services       Precautions / Restrictions Precautions Precautions: Fall Restrictions Weight Bearing Restrictions: No      Mobility  Bed Mobility Overal bed mobility: Modified Independent             General bed mobility comments: increased time, hob elevated, increased time  Transfers Overall transfer level: Needs assistance Equipment used: None Transfers: Sit to/from Stand Sit to Stand: Min guard         General transfer comment: min guard for stability  Ambulation/Gait Ambulation/Gait assistance: Min guard Ambulation Distance (Feet): 150 Feet Assistive device: None (pushed IV pole) Gait Pattern/deviations: Step-through pattern;Decreased stride length;Wide base of support Gait velocity: dec Gait velocity interpretation: Below normal speed for age/gender General Gait Details: pt with bilat foot neuropathy, s/p 100' of ambulation pt desired to push IV pole for support stating "i feel more stable", mild SOB, pt on 2LO2 via [place]  Stairs            Wheelchair Mobility    Modified Rankin (Stroke Patients Only) Modified Rankin (Stroke Patients Only) Pre-Morbid Rankin Score: No significant disability Modified Rankin: Slight disability     Balance Overall balance assessment: Needs assistance Sitting-balance support: Feet supported;No  upper extremity supported Sitting balance-Leahy Scale: Fair Sitting balance - Comments: pt able to done socks sitting eob   Standing balance support: No upper extremity supported Standing balance-Leahy Scale: Good Standing balance comment: pt able to pull shorts up in standing without difficulty                             Pertinent Vitals/Pain Pain Assessment: No/denies pain    Home Living Family/patient expects to be discharged to:: Private residence Living Arrangements: Spouse/significant other Available Help at Discharge: Family Type of Home: House Home Access: Stairs to Games developer of Steps: 3 Home Layout: One level Home Equipment: Kasandra Knudsen - quad;Walker - 2 wheels;Wheelchair - manual      Prior Function Level of Independence: Independent         Comments: drove, works on Marine scientist   Dominant Hand: Right    Extremity/Trunk Assessment   Upper Extremity Assessment: LUE deficits/detail       LUE Deficits / Details: mild weakness 4/5   Lower Extremity Assessment: Generalized weakness      Cervical / Trunk Assessment: Normal  Communication   Communication: No difficulties  Cognition Arousal/Alertness: Awake/alert Behavior During Therapy: WFL for tasks assessed/performed Overall Cognitive Status: Within Functional Limits for tasks assessed                      General Comments General comments (skin integrity, edema, etc.): pt with bilat LE edema    Exercises        Assessment/Plan    PT Assessment Patient needs continued  PT services  PT Diagnosis Difficulty walking;Generalized weakness   PT Problem List Decreased strength;Decreased range of motion;Decreased activity tolerance;Decreased balance;Decreased mobility  PT Treatment Interventions DME instruction;Gait training;Stair training;Functional mobility training;Therapeutic activities;Therapeutic exercise;Balance training   PT Goals  (Current goals can be found in the Care Plan section) Acute Rehab PT Goals Patient Stated Goal: home tomorrow PT Goal Formulation: With patient Time For Goal Achievement: 12/23/15 Potential to Achieve Goals: Good    Frequency Min 4X/week   Barriers to discharge        Co-evaluation               End of Session Equipment Utilized During Treatment: Gait belt Activity Tolerance: Patient tolerated treatment well Patient left: in chair;with call bell/phone within reach;with chair alarm set Nurse Communication: Mobility status         Time: 7494-4967 PT Time Calculation (min) (ACUTE ONLY): 24 min   Charges:   PT Evaluation $PT Eval Moderate Complexity: 1 Procedure PT Treatments $Gait Training: 8-22 mins   PT G CodesKingsley Callander 12/16/2015, 11:17 AM   [name], PT, DPT Pager #: [phone] Office #: [phone]

## 2015-12-16 NOTE — Care Management Note (Signed)
Case Management Note  Patient Details  Name: [name] MRN: [id number] Date of Birth: 1940/11/18  Subjective/Objective:                    Action/Plan: Patient discharging home today with his family. PT recommended outpatient therapy. CM met with the patient and he does not want any further therapy. No further needs per CM.  Expected Discharge Date:                  Expected Discharge Plan:  Home/Self Care  In-House Referral:     Discharge planning Services     Post Acute Care Choice:    Choice offered to:     DME Arranged:    DME Agency:     HH Arranged:    [place] Agency:     Status of Service:  Completed, signed off  Medicare Important Message Given:  Yes Date Medicare IM Given:    Medicare IM give by:    Date Additional Medicare IM Given:    Additional Medicare Important Message give by:     If discussed at [place] of Stay Meetings, dates discussed:    Additional Comments:  Pollie Friar, RN 12/16/2015, 1:45 PM

## 2015-12-16 NOTE — Progress Notes (Addendum)
Discharge orders received, Pt for discharge home today wIV d/c'd. D/c instructions and RX given with verbalized understanding. Family at bedside to assist patient with discharge. Staff bought pt downstairs via wheelchair.

## 2015-12-16 NOTE — Telephone Encounter (Signed)
returned call and s.w. pt wife and gv new appt.Marland KitchenMarland KitchenMarland Kitchen

## 2015-12-17 ENCOUNTER — Encounter: Payer: Self-pay | Admitting: Internal Medicine

## 2015-12-17 ENCOUNTER — Telehealth: Payer: Self-pay | Admitting: Interventional Cardiology

## 2015-12-17 ENCOUNTER — Ambulatory Visit (HOSPITAL_BASED_OUTPATIENT_CLINIC_OR_DEPARTMENT_OTHER): Payer: Medicare Other | Admitting: Internal Medicine

## 2015-12-17 ENCOUNTER — Other Ambulatory Visit (HOSPITAL_BASED_OUTPATIENT_CLINIC_OR_DEPARTMENT_OTHER): Payer: Medicare Other

## 2015-12-17 ENCOUNTER — Ambulatory Visit: Payer: Medicare Other

## 2015-12-17 ENCOUNTER — Ambulatory Visit: Payer: Medicare Other | Admitting: Internal Medicine

## 2015-12-17 ENCOUNTER — Other Ambulatory Visit: Payer: Medicare Other

## 2015-12-17 ENCOUNTER — Telehealth: Payer: Self-pay | Admitting: Internal Medicine

## 2015-12-17 VITALS — BP 183/107 | HR 73 | Temp 98.0°F | Resp 18 | Ht 69.0 in | Wt 177.4 lb

## 2015-12-17 DIAGNOSIS — D491 Neoplasm of unspecified behavior of respiratory system: Secondary | ICD-10-CM

## 2015-12-17 DIAGNOSIS — I10 Essential (primary) hypertension: Secondary | ICD-10-CM | POA: Diagnosis not present

## 2015-12-17 DIAGNOSIS — C3411 Malignant neoplasm of upper lobe, right bronchus or lung: Secondary | ICD-10-CM | POA: Diagnosis present

## 2015-12-17 DIAGNOSIS — C7931 Secondary malignant neoplasm of brain: Secondary | ICD-10-CM

## 2015-12-17 DIAGNOSIS — C3491 Malignant neoplasm of unspecified part of right bronchus or lung: Secondary | ICD-10-CM

## 2015-12-17 DIAGNOSIS — Z5111 Encounter for antineoplastic chemotherapy: Secondary | ICD-10-CM

## 2015-12-17 DIAGNOSIS — I159 Secondary hypertension, unspecified: Secondary | ICD-10-CM

## 2015-12-17 LAB — COMPREHENSIVE METABOLIC PANEL
ALBUMIN: 3.1 g/dL — AB (ref 3.5–5.0)
ALK PHOS: 46 U/L (ref 40–150)
ALT: 9 U/L (ref 0–55)
ANION GAP: 5 meq/L (ref 3–11)
AST: 18 U/L (ref 5–34)
BUN: 17.1 mg/dL (ref 7.0–26.0)
CALCIUM: 8.6 mg/dL (ref 8.4–10.4)
CO2: 28 mEq/L (ref 22–29)
Chloride: 101 mEq/L (ref 98–109)
Creatinine: 0.8 mg/dL (ref 0.7–1.3)
Glucose: 78 mg/dl (ref 70–140)
Potassium: 4.5 mEq/L (ref 3.5–5.1)
Sodium: 135 mEq/L — ABNORMAL LOW (ref 136–145)
TOTAL PROTEIN: 6.2 g/dL — AB (ref 6.4–8.3)

## 2015-12-17 LAB — CBC WITH DIFFERENTIAL/PLATELET
BASO%: 0.1 % (ref 0.0–2.0)
BASOS ABS: 0 10*3/uL (ref 0.0–0.1)
EOS ABS: 0 10*3/uL (ref 0.0–0.5)
EOS%: 0 % (ref 0.0–7.0)
HCT: 39.9 % (ref 38.4–49.9)
HEMOGLOBIN: 12.6 g/dL — AB (ref 13.0–17.1)
LYMPH%: 14.3 % (ref 14.0–49.0)
MCH: 30.8 pg (ref 27.2–33.4)
MCHC: 31.7 g/dL — ABNORMAL LOW (ref 32.0–36.0)
MCV: 97.2 fL (ref 79.3–98.0)
MONO#: 0.9 10*3/uL (ref 0.1–0.9)
MONO%: 11 % (ref 0.0–14.0)
NEUT%: 74.6 % (ref 39.0–75.0)
NEUTROS ABS: 5.8 10*3/uL (ref 1.5–6.5)
PLATELETS: 129 10*3/uL — AB (ref 140–400)
RBC: 4.11 10*6/uL — ABNORMAL LOW (ref 4.20–5.82)
RDW: 15.2 % — AB (ref 11.0–14.6)
WBC: 7.8 10*3/uL (ref 4.0–10.3)
lymph#: 1.1 10*3/uL (ref 0.9–3.3)

## 2015-12-17 MED ORDER — CLONIDINE HCL 0.1 MG PO TABS
0.2000 mg | ORAL_TABLET | Freq: Once | ORAL | Status: AC
  Administered 2015-12-17: 0.2 mg via ORAL

## 2015-12-17 NOTE — Telephone Encounter (Signed)
210/92 initial BP with oncology, pt given clonidine 0.'2mg'$  and upon recheck BP down to 183/107  Per Mrs Lovelady the pt is having issues with elevated BP.  This past Saturday the pt's SBP remained above 200 until mid day and then the pt developed seizures and was hospitalized. Seizure was most like related to lesions and swelling in brain.   The pt's BP was managed during his hospitalization and he was discharged yesterday.  Today the BP is elevated again and the pt would like cardiology to recommend medication changes.  I will forward this information to Dr Irish Lack to review and advise.

## 2015-12-17 NOTE — Addendum Note (Signed)
Addended by: Lucile Crater on: 12/17/2015 10:20 AM   Modules accepted: Medications

## 2015-12-17 NOTE — Telephone Encounter (Signed)
Gave and printed appt sched and avs for pt for june

## 2015-12-17 NOTE — Progress Notes (Signed)
[place] Telephone:([phone]) [phone]   [phone]) [phone]  OFFICE PROGRESS NOTE  [name], PA-C [place] [address]  DIAGNOSIS: Metastatic non-small cell lung cancer, squamous cell carcinoma initially diagnosed as Stage IIA (T2b., N0, M0) non-small cell lung cancer, squamous cell carcinoma diagnosed in December of 2013   PRIOR THERAPY:  1) Status post right upper lobectomy under the care of Dr. Prescott Gum on 09/14/2012.  2) Adjuvant chemotherapy with cisplatin 75 mg/M2 and docetaxel 75 mg/M2 with Neulasta support every 3 weeks, status post 1 cycle. Starting with cycle #2, patient will receive carboplatin for an AUC initially of 4.5 and paclitaxel at 175 mg per meter squared with Neulasta support given every 3 weeks. Status post a total of 3 cycles.  3) Systemic chemotherapy with carboplatin for AUC of 5 on day 1 and gemcitabine 1000 MG/M2 on days 1 and 8 every 3 weeks. First dose 09/11/2014. Status post 4 cycles. Discontinued secondary to intolerance 4) Immunotherapy with Nivolumab 3 MG/KG every 2 weeks. Status post 4 cycles. 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  CURRENT THERAPY: Systemic chemotherapy with docetaxel 75 MG/M2 and Cyramza 10 MG/KG every 3 weeks with Neulasta support. Status post 11 cycles. Starting from cycle #9 and the patient would be treated with only Cyramza as a single agent. Docetaxel was discontinued secondary to intolerance.  CHEMOTHERAPY INTENT: Palliative. CURRENT # OF CHEMOTHERAPY CYCLES: 11 CURRENT ANTIEMETICS: Zofran, dexamethasone and Compazine  CURRENT SMOKING STATUS: Quit smoking recently.  ORAL CHEMOTHERAPY AND CONSENT: None  CURRENT BISPHOSPHONATES USE: None  PAIN MANAGEMENT: 3/10 controlled by Norco when necessary  NARCOTICS INDUCED CONSTIPATION: Milk of magnesia on as-needed basis.  LIVING WILL AND CODE STATUS: No CODE BLUE.   INTERVAL HISTORY:  [name] 75 y.o. male returns to the clinic today for followup visit accompanied by his wife. The patient is not feeling well today. He was recently admitted to [facility] with confusion and seizure. MRI of the brain performed at that time showed evidence for progression of the metastatic brain lesions. He was started on Decadron 4 mg by mouth every 6 hours in addition to Keppra 750 mg by mouth twice a day. He is feeling a little bit better. He is scheduled to see Dr. Lisbeth Renshaw for evaluation and treatment of the progressive brain lesion. He was supposed to start cycle #12 of his treatment with Cyramza today. His blood pressure is not well controlled and systolic blood pressure running over 200. He is currently on Norvasc and Lopressor, managed by Dr. Christ Kick. He also continues to have shortness of breath and currently on home oxygen. He denied having any significant chest pain but has mild cough and no hemoptysis. He denied having any significant nausea or vomiting, no fever or chills, no weight loss or night sweats.  He was supposed to have repeat CT scan of the chest, abdomen and pelvis before this visit but because of his recent hospitalization he missed his appointment.   MEDICAL HISTORY: Past Medical History  Diagnosis Date  . Arthritis   . Wheezing   . Sore throat   . Carotid artery occlusion   . COPD (chronic obstructive pulmonary disease) ([place])   . Lumbar disc disease   . Restless leg syndrome   . Allergic rhinitis   . Hypertension   . Anxiety     anxiety  . Shortness of breath   . Lung mass   .  GERD (gastroesophageal reflux disease)   . H/O hiatal hernia   . Pre-operative cardiovascular examination   . Nonspecific abnormal unspecified cardiovascular function study   . Peripheral vascular disease, unspecified ([place])   . Pneumonia 2014    ?   Marland Kitchen On home oxygen therapy     prn  . Constipation   . 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 S/P radiation therapy 12/04/14  srs    lt frontal brain  . Cancer ([place]) 09/14/12    invasive mod diff squamous cell ca  . Metastasis to brain [facility]) 02/2015    ALLERGIES:  has No Known Allergies.  MEDICATIONS:  Current Outpatient Prescriptions  Medication Sig Dispense Refill  . amLODipine (NORVASC) 5 MG tablet Take 1 tablet (5 mg total) by mouth daily. May take an additional tablet as needed for elevated blood pressure. (Patient taking differently: Take 5 mg by mouth at bedtime. ) 45 tablet 11  . aspirin EC 81 MG tablet Take 81 mg by mouth daily.     . clonazePAM (KLONOPIN) 0.5 MG tablet Take 0.5 mg by mouth 2 (two) times daily as needed for anxiety. Takes one tablet by mouth two times daily as needed for anxiety.    Marland Kitchen dexamethasone (DECADRON) 4 MG tablet Take 1 tablet (4 mg total) by mouth 4 (four) times daily. 60 tablet 0  . furosemide (LASIX) 20 MG tablet Change to Lasix 20 mg BID x 7 days; then return to 20 mg Q AM. (Patient taking differently: Take 20 mg by mouth daily. ) 45 tablet 0  . guaifenesin (HUMIBID E) 400 MG TABS tablet Take 400 mg by mouth 2 (two) times daily.    Marland Kitchen HYDROcodone-acetaminophen (NORCO) 7.5-325 MG tablet Take 1 tablet by mouth every 6 (six) hours as needed for moderate pain. 45 tablet 0  . ipratropium-albuterol (DUONEB) 0.5-2.5 (3) MG/3ML SOLN Take 3 mLs by nebulization 4 (four) times daily. (Patient taking differently: Take 3 mLs by nebulization 4 (four) times daily. Pt takes nebulizer 2 to 3 times daily.) 360 mL 2  . isosorbide mononitrate (IMDUR) 30 MG 24 hr tablet Take 1 tablet (30 mg total) by mouth daily. 30 tablet 4  . levETIRAcetam (KEPPRA) 750 MG tablet Take 2 tablets (1,500 mg total) by mouth 2 (two) times daily. 60 tablet 1  . lisinopril (PRINIVIL,ZESTRIL) 40 MG tablet Take 40 mg by mouth at bedtime.     . metoprolol tartrate (LOPRESSOR) 25 MG tablet Take 25 mg by mouth 2 (two) times daily.     Marland Kitchen omeprazole (PRILOSEC) 40 MG capsule Take 40 mg by mouth daily.    . OXYGEN Inhale 2-3 L into  the lungs continuous.    . pentoxifylline (TRENTAL) 400 MG CR tablet Take 1 tablet (400 mg total) by mouth 3 (three) times daily. 90 tablet 12  . pramipexole (MIRAPEX) 1 MG tablet Take 1 mg by mouth at bedtime.    . prochlorperazine (COMPAZINE) 10 MG tablet Take 10 mg by mouth every 6 (six) hours as needed for nausea or vomiting. Reported on 09/25/2015    . traMADol (ULTRAM) 50 MG tablet Take 1 tablet (50 mg total) by mouth every 12 (twelve) hours as needed. (Patient taking differently: Take 50 mg by mouth daily as needed (pain). ) 30 tablet 0  . triamcinolone cream (KENALOG) 0.1 % Apply 1 application topically 2 (two) times daily as needed (itching/ dry skin).     . vitamin E (VITAMIN E) 1000  UNIT capsule Take 1 capsule (1,000 Units total) by mouth daily. 30 capsule 12  . zolpidem (AMBIEN) 10 MG tablet Take 10 mg by mouth at bedtime as needed for sleep. Reported on 09/25/2015     No current facility-administered medications for this visit.   Facility-Administered Medications Ordered in Other Visits  Medication Dose Route Frequency Provider Last Rate Last Dose  . sodium chloride 0.9 % injection 10 mL  10 mL Intracatheter PRN Curt Bears, MD   10 mL at 01/29/15 0944    SURGICAL HISTORY:  Past Surgical History  Procedure Laterality Date  . Carotid endarterectomy  10/15/2010    left  . Hand surgery      repair of the left long, ring, and small finger after a saw injury  . Video bronchoscopy  07/24/2012    Procedure: VIDEO BRONCHOSCOPY WITH FLUORO;  Surgeon: Kathee Delton, MD;  Location: [facility];  Service: Cardiopulmonary;  Laterality: Bilateral;  . Lobectomy Right 09/14/2012    Procedure: LOBECTOMY;  Surgeon: Ivin Poot, MD;  Location: [facility];  Service: Thoracic;  Laterality: Right;  RIGHT UPPER LOBECTOMY  . Video assisted thoracoscopy Right 09/14/2012    Procedure: VIDEO ASSISTED THORACOSCOPY;  Surgeon: Ivin Poot, MD;  Location: [facility];  Service: Thoracic;  Laterality: Right;  .  Video bronchoscopy with endobronchial ultrasound N/A 08/27/2014    Procedure: VIDEO BRONCHOSCOPY WITH ENDOBRONCHIAL ULTRASOUND;  Surgeon: Ivin Poot, MD;  Location: [facility];  Service: Thoracic;  Laterality: N/A;  . Scalene node biopsy Right 08/27/2014    Procedure: BIOPSY SCALENE NODE;  Surgeon: Ivin Poot, MD;  Location: [place];  Service: Thoracic;  Laterality: Right;  . Sterotactic radiosurgery Right 09/25/14    right parietal 30Gy/1 fx    REVIEW OF SYSTEMS:  Constitutional: positive for fatigue Eyes: negative Ears, nose, mouth, throat, and face: negative Respiratory: positive for cough, dyspnea on exertion and wheezing Cardiovascular: negative Gastrointestinal: negative Genitourinary:negative Integument/breast: negative Hematologic/lymphatic: negative Musculoskeletal:negative Neurological: positive for seizures and weakness Behavioral/Psych: negative Endocrine: negative Allergic/Immunologic: negative   PHYSICAL EXAMINATION: General appearance: alert, cooperative and no distress Head: Normocephalic, without obvious abnormality, atraumatic Neck: no adenopathy, no JVD, supple, symmetrical, trachea midline and thyroid not enlarged, symmetric, no tenderness/mass/nodules Lymph nodes: Cervical, supraclavicular, and axillary nodes normal. Resp: wheezes bilaterally Back: symmetric, no curvature. ROM normal. No CVA tenderness. Cardio: regular rate and rhythm, S1, S2 normal, no murmur, click, rub or gallop GI: soft, non-tender; bowel sounds normal; no masses,  no organomegaly Extremities: extremities normal, atraumatic, no cyanosis or edema Neurologic: Alert and oriented X 3, normal strength and tone. Normal symmetric reflexes. Normal coordination and gait  ECOG PERFORMANCE STATUS: 1 - Symptomatic but completely ambulatory  Blood pressure 183/107, pulse 73, temperature 98 F (36.7 C), temperature source Oral, resp. rate 18, height '5\' 9"'$  (1.753 m), weight 177 lb 6.4 oz (80.468 kg),  SpO2 100 %.  LABORATORY DATA: Lab Results  Component Value Date   WBC 7.8 12/17/2015   HGB 12.6* 12/17/2015   HCT 39.9 12/17/2015   MCV 97.2 12/17/2015   PLT 129* 12/17/2015      Chemistry      Component Value Date/Time   NA 134* 12/16/2015 0529   NA 142 11/26/2015 0834   K 4.9 12/16/2015 0529   K 3.8 11/26/2015 0834   CL 103 12/16/2015 0529   CL 101 12/28/2012 0852   CO2 23 12/16/2015 0529   CO2 32* 11/26/2015 0834   BUN 21* 12/16/2015 0529  BUN 17.4 11/26/2015 0834   CREATININE 0.84 12/16/2015 0529   CREATININE 1.0 11/26/2015 0834      Component Value Date/Time   CALCIUM 8.3* 12/16/2015 0529   CALCIUM 9.0 11/26/2015 0834   ALKPHOS 49 12/13/2015 1658   ALKPHOS 55 11/26/2015 0834   AST 19 12/13/2015 1658   AST 13 11/26/2015 0834   ALT 9* 12/13/2015 1658   ALT <9 11/26/2015 0834   BILITOT 0.8 12/13/2015 1658   BILITOT <0.30 11/26/2015 0834       RADIOGRAPHIC STUDIES: Ct Head Wo Contrast  12/13/2015  CLINICAL DATA:  Metastatic lung cancer to the brain, seizure activity EXAM: CT HEAD WITHOUT CONTRAST TECHNIQUE: Contiguous axial images were obtained from the base of the skull through the vertex without intravenous contrast. COMPARISON:  10/06/2015, 06/25/2015 FINDINGS: Known high right parietal lesion is difficult to visualize without contrast. Compared to the prior brain MRI 10/06/2015, there does appear to be increased surrounding vasogenic edema and mass effect with localized sulcal effacement in the right parietal lobe. Vasogenic edema extends into the adjacent white matter in a larger pattern than the previous MRI. The other smaller brain lesions by MRI are not demonstrated by noncontrast CT. No acute intracranial hemorrhage, midline shift, herniation, hydrocephalus, or extra-axial fluid collection. Cisterns are patent. No cerebellar abnormality appreciated. Orbits are symmetric. Mastoids and sinuses remain clear.  Skull appears intact. Exam is limited with some motion  artifact. IMPRESSION: Increased right parietal lobe cortical and white matter edema pattern about the known right parietal metastasis with localized mass effect and sulcal effacement. No associated intracranial hemorrhage Other known small brain metastases are not well demonstrated by noncontrast imaging. No hydrocephalus or significant herniation. These results were called by telephone at the time of interpretation on 12/13/2015 at 2:22 pm to Dr. Charlesetta Shanks , who verbally acknowledged these results. Electronically Signed   By: Jerilynn Mages.  Shick M.D.   On: 12/13/2015 14:33   Mr Jeri Cos ZW Contrast  12/14/2015  CLINICAL DATA:  Initial evaluation for acute left-sided weakness and seizure. History of metastatic disease. EXAM: MRI HEAD WITHOUT AND WITH CONTRAST TECHNIQUE: Multiplanar, multiecho pulse sequences of the brain and surrounding structures were obtained without and with intravenous contrast. CONTRAST:  15m MULTIHANCE GADOBENATE DIMEGLUMINE 529 MG/ML IV SOLN COMPARISON:  Prior CT from earlier the same day as well as previous MRI from 10/06/2015. FINDINGS: Cerebral volume is stable. Mild confluent T2/FLAIR hyperintensity within the periventricular white matter most consistent with chronic small vessel ischemic disease. Chronic small vessel ischemic changes present within the pons as well. These changes are stable relative to prior. Metastatic lesion at the right parietal lobe is increased in size now measuring 2.1 x 2.4 x 2.2 cm (series 13, image 40, previously 1.1 x 1.3 x 1.2 cm). Surrounding vasogenic edema it is worsened relative to most recent MRI. No internal hemorrhage on gradient echo sequence. Additional lesion along the gray-white matter junction within the left frontal lobe is also increased in size, now measuring 8 mm (series 13, image 31), previously 5 mm). Surrounding edema about this lesion has also worsened. No interval hemorrhage. Additional small lesion along the anterior inferior left frontal  convexity measuring 6 mm is stable the slightly increased in size (series 13, image 23). Additional faint lesion within the right cerebellar hemisphere measuring approximately 4 mm is not significantly changed. No definite new lesions. Overall, findings are consistent with progressive disease. No evidence for acute infarct. Major intracranial vascular flow voids are preserved. Gray-white matter differentiation  otherwise maintained. No other changes related to seizure. No acute or chronic intracranial hemorrhage. No midline shift. Basilar cisterns are patent. No hydrocephalus. No extra-axial fluid collection. Major dural sinuses are grossly patent. Craniocervical junction normal. Visualized upper cervical spine unremarkable. Pituitary gland within normal limits. No acute abnormality about the orbits. Patient is status post lens extraction on the right. Scattered mucosal thickening within the paranasal sinuses with fluid level within the lateral recess of the right sphenoid sinus. Bilateral mastoid effusions. Fluid within the nasopharynx. Patient is likely intubated. Bone marrow signal intensity within normal limits. No scalp soft tissue abnormality. IMPRESSION: 1. Overall interval progression of disease, with increased size of right parietal and left frontal lobe lesions as above with increased associated edema. 2. Additional smaller lesions within the anterior left frontal lobe and right cerebellum are overall not significantly changed in size. No definite new lesions identified. 3. No other acute intracranial process. Electronically Signed   By: Jeannine Boga M.D.   On: 12/14/2015 03:33   Dg Chest Port 1 View  12/13/2015  CLINICAL DATA:  Left-sided weakness, seizure activity, intubated EXAM: PORTABLE CHEST 1 VIEW COMPARISON:  06/20/2015 FINDINGS: Endotracheal tube 4.1 cm above the carina. Right IJ power port catheter tip mid SVC. Monitor leads and defibrillator pads noted across the chest. Postop changes  from previous right upper lobectomy with dense apical consolidation on the right. Stable chronic volume loss in the right hemi thorax. No superimposed edema, significant collapse or consolidation. No pneumothorax. Aortic atherosclerosis noted. IMPRESSION: Endotracheal tube 4 cm above the carina. Otherwise stable postoperative appearance of the chest. No superimposed definite acute process. Electronically Signed   By: Jerilynn Mages.  Shick M.D.   On: 12/13/2015 14:17    ASSESSMENT AND PLAN: This is a very pleasant 75 years old white male with history of stage IIA non-small cell lung cancer status post right upper lobectomy followed by 4 cycles of adjuvant chemotherapy.  The patient underwent systemic chemotherapy with carboplatin and gemcitabine status post 4 cycles discontinued secondary to intolerance and mild disease progression.  The patient completed treatment with Nivolumab status post 4 cycles but this was discontinued secondary to disease progression. He also recently completed stereotactic radiotherapy to metastatic brain lesions. The patient is currently undergoing systemic chemotherapy with docetaxel and Cyramza status post 11 cycles. Starting from cycle #9 he was on single agent Cyramza. Unfortunately the patient was found to have evidence for disease progression in the brain. He is followed by Dr. Lisbeth Renshaw and expected to have treatment for the metastatic brain lesions soon. I recommended for the patient to hold his current treatment with Cyramza until completion of the brain treatment. I will arrange for the patient to have repeat CT scan of the chest, abdomen and pelvis performed in the next 2 weeks. For the uncontrolled hypertension, I gave the patient clonidine 0.2 mg by mouth 1 today and he was asked to contact Dr. Christ Kick for adjustment of his blood pressure medication. Remeasurement of his blood pressure showed improvement but still elevated. He would come back for follow-up visit in 2 weeks for  reevaluation and more detailed discussion of his treatment options. He was advised to call immediately if he has any concerning symptoms in the interval.  The patient voices understanding of current disease status and treatment options and is in agreement with the current care plan.  All questions were answered. The patient knows to call the clinic with any problems, questions or concerns. We can certainly see the patient much  sooner if necessary.  Disclaimer: This note was dictated with voice recognition software. Similar sounding words can inadvertently be transcribed and may not be corrected upon review.

## 2015-12-17 NOTE — Telephone Encounter (Signed)
New Message   Pt wife states pt is being seen at the cancer center. Pt wife states pt Pt c/o BP issue: STAT if pt c/o blurred vision, one-sided weakness or slurred speech  1. What are your last 5 BP readings? Today 210/92  2. Are you having any other symptoms (ex. Dizziness, headache, blurred vision, passed out)? No  3. What is your BP issue? bp high; Wife wants pt to be seen today by dr Irish Lack

## 2015-12-17 NOTE — Telephone Encounter (Signed)
Left message on voicemail for pt's wife to contact the office.

## 2015-12-18 ENCOUNTER — Telehealth: Payer: Self-pay | Admitting: Radiation Oncology

## 2015-12-18 ENCOUNTER — Ambulatory Visit: Payer: Medicare Other

## 2015-12-18 ENCOUNTER — Encounter: Payer: Self-pay | Admitting: Radiation Oncology

## 2015-12-18 LAB — CULTURE, BLOOD (ROUTINE X 2)
Culture: NO GROWTH
Culture: NO GROWTH

## 2015-12-18 MED ORDER — METOPROLOL TARTRATE 25 MG PO TABS: 25.0000 mg | ORAL_TABLET | Freq: Two times a day (BID) | ORAL | Status: DC

## 2015-12-18 MED ORDER — AMLODIPINE BESYLATE 5 MG PO TABS: 5.0000 mg | ORAL_TABLET | Freq: Two times a day (BID) | ORAL | Status: DC

## 2015-12-18 NOTE — Telephone Encounter (Signed)
Confirmed 12/22/15 appointment for patient with Dr. Lisbeth Renshaw at [phone]

## 2015-12-18 NOTE — Telephone Encounter (Signed)
**Note De-Identified Farah Lepak Obfuscation** Per Almyra Deforest, PA-c ([facility]) the pts wife, [name], is advised to increase the pts Amlodipine to 5 mg BID and that if the pts systolic BP is greater than 160 at 1 and 1/2 to 2 hours after taking AM and PM medications that she may give additional 25 mg dose of Metoprolol up to BID. She repeated instructions back to me and verbalized understanding.  Medications have been updated in the pts chart.  Will forward to Dr Irish Lack as Juluis Rainier.

## 2015-12-18 NOTE — Telephone Encounter (Signed)
Informed of message retrieved from Mont Dutton' voicemail. Understand from the message that the patient has been discharged from the hospital following seizure activity. Wife is requesting a follow up with Dr. Lisbeth Renshaw and "not his PA." Also, Mrs. Salminen questioned if a neuro appointment is necessary. Phoned patient's home. No answer. Left message requesting return call to ensure needs have been met.

## 2015-12-18 NOTE — Telephone Encounter (Signed)
Follow Up   Pt c/o BP issue:  1. What are your last 5 BP readings? Pt wife called states that his BP was high this morning. It started at 204/170. She states she gave him amlodipine. The Bp went down to 167/85. Then he went to bathroom it went to 191/106. About 15 minutes later it was 173/82. A little bit later it was 150/71. Just before this call it was 157/77.     2. Are you having any other symptoms (ex. Dizziness, headache, blurred vision, passed out)? No, just a little head pressure.   3. What is your medication issue? Pt wife states that she hopes that it levels off but she is fairly concerned that the medication isn't working properly.   Comments: MRI and EEG and CT scan was completed. Cancer has increased from 13 to 24. The swelling has increased. Oncologist will bring this to the brain team to determine what to do with the growth.

## 2015-12-22 ENCOUNTER — Emergency Department (HOSPITAL_COMMUNITY): Payer: Medicare Other

## 2015-12-22 ENCOUNTER — Other Ambulatory Visit: Payer: Self-pay | Admitting: Radiation Therapy

## 2015-12-22 ENCOUNTER — Encounter (HOSPITAL_COMMUNITY): Payer: Self-pay | Admitting: Emergency Medicine

## 2015-12-22 ENCOUNTER — Emergency Department (HOSPITAL_COMMUNITY)
Admission: EM | Admit: 2015-12-22 | Discharge: 2015-12-22 | Disposition: A | Discharge status: Home / Self Care | Payer: Medicare Other | Attending: Emergency Medicine | Admitting: Emergency Medicine

## 2015-12-22 ENCOUNTER — Ambulatory Visit
Admission: RE | Admit: 2015-12-22 | Discharge: 2015-12-22 | Disposition: A | Discharge status: Home / Self Care | Payer: Medicare Other | Source: Ambulatory Visit | Attending: Radiation Oncology | Admitting: Radiation Oncology

## 2015-12-22 ENCOUNTER — Encounter: Payer: Self-pay | Admitting: Radiation Oncology

## 2015-12-22 VITALS — BP 139/125 | HR 83 | Temp 97.6°F | Resp 20 | Ht 69.0 in | Wt 182.2 lb

## 2015-12-22 DIAGNOSIS — Z7982 Long term (current) use of aspirin: Secondary | ICD-10-CM | POA: Diagnosis not present

## 2015-12-22 DIAGNOSIS — C3490 Malignant neoplasm of unspecified part of unspecified bronchus or lung: Secondary | ICD-10-CM | POA: Insufficient documentation

## 2015-12-22 DIAGNOSIS — I160 Hypertensive urgency: Secondary | ICD-10-CM | POA: Diagnosis not present

## 2015-12-22 DIAGNOSIS — C7931 Secondary malignant neoplasm of brain: Secondary | ICD-10-CM | POA: Diagnosis present

## 2015-12-22 DIAGNOSIS — Z510 Encounter for antineoplastic radiation therapy: Secondary | ICD-10-CM | POA: Insufficient documentation

## 2015-12-22 DIAGNOSIS — Z87891 Personal history of nicotine dependence: Secondary | ICD-10-CM | POA: Insufficient documentation

## 2015-12-22 DIAGNOSIS — M1990 Unspecified osteoarthritis, unspecified site: Secondary | ICD-10-CM | POA: Insufficient documentation

## 2015-12-22 DIAGNOSIS — Z79891 Long term (current) use of opiate analgesic: Secondary | ICD-10-CM | POA: Insufficient documentation

## 2015-12-22 DIAGNOSIS — C7949 Secondary malignant neoplasm of other parts of nervous system: Principal | ICD-10-CM

## 2015-12-22 DIAGNOSIS — I10 Essential (primary) hypertension: Secondary | ICD-10-CM | POA: Insufficient documentation

## 2015-12-22 DIAGNOSIS — R0602 Shortness of breath: Secondary | ICD-10-CM | POA: Diagnosis present

## 2015-12-22 DIAGNOSIS — I248 Other forms of acute ischemic heart disease: Secondary | ICD-10-CM | POA: Diagnosis not present

## 2015-12-22 DIAGNOSIS — J449 Chronic obstructive pulmonary disease, unspecified: Secondary | ICD-10-CM | POA: Diagnosis not present

## 2015-12-22 DIAGNOSIS — I739 Peripheral vascular disease, unspecified: Secondary | ICD-10-CM | POA: Insufficient documentation

## 2015-12-22 DIAGNOSIS — Z79899 Other long term (current) drug therapy: Secondary | ICD-10-CM | POA: Diagnosis not present

## 2015-12-22 LAB — CBC
HEMATOCRIT: 37.9 % — AB (ref 39.0–52.0)
HEMOGLOBIN: 12.3 g/dL — AB (ref 13.0–17.0)
MCH: 31.3 pg (ref 26.0–34.0)
MCHC: 32.5 g/dL (ref 30.0–36.0)
MCV: 96.4 fL (ref 78.0–100.0)
PLATELETS: 208 10*3/uL (ref 150–400)
RBC: 3.93 MIL/uL — AB (ref 4.22–5.81)
RDW: 14.5 % (ref 11.5–15.5)
WBC: 8.2 10*3/uL (ref 4.0–10.5)

## 2015-12-22 LAB — BASIC METABOLIC PANEL
Anion gap: 7 (ref 5–15)
BUN: 26 mg/dL — ABNORMAL HIGH (ref 6–20)
CO2: 30 mmol/L (ref 22–32)
CREATININE: 0.81 mg/dL (ref 0.61–1.24)
Calcium: 8.1 mg/dL — ABNORMAL LOW (ref 8.9–10.3)
Chloride: 95 mmol/L — ABNORMAL LOW (ref 101–111)
GFR calc Af Amer: >60 mL/min (ref >60)
GFR calc non Af Amer: >60 mL/min (ref >60)
Glucose, Bld: 128 mg/dL — ABNORMAL HIGH (ref 65–99)
Potassium: 4.5 mmol/L (ref 3.5–5.1)
Sodium: 132 mmol/L — ABNORMAL LOW (ref 135–145)

## 2015-12-22 LAB — I-STAT TROPONIN, ED
TROPONIN I, POC: 0.1 ng/mL — AB (ref 0.00–0.08)
TROPONIN I, POC: 0.08 ng/mL (ref 0.00–0.08)

## 2015-12-22 NOTE — ED Notes (Signed)
Per EMS pt from home for call of hypertension. Pt family reported pt having systolic of >103 and no change with medications. Pt also reports increasing swelling in lower extremities. Pt reports improvement in breathing after albuterol and atrovent treatment given by EMS also '125mg'$  Solu-medrol given IVP by EMS. Pt awake and oriented x 4 at this time with nebulizer treatment completing. Respiratory Therapy at bedside.

## 2015-12-22 NOTE — ED Provider Notes (Signed)
CSN: [id number]     Arrival date & time 12/22/15  8416 History   First MD Initiated Contact with Patient 12/22/15 [phone]     Chief Complaint  Patient presents with  . Shortness of Breath     (Consider location/radiation/quality/duration/timing/severity/associated sxs/prior Treatment) HPI  This is a 75 year old male with metastatic lung cancer and labile hypertension. Yesterday evening he developed a feeling of shortness of breath and chest tightness as if a band were wrapped around his chest. His wife checked his blood pressure at 10 PM and found to be 240/120. He was given an extra dose of metoprolol. His blood pressure did not improve to their satisfaction so EMS was called. His blood pressure was 166/83 on arrival and was still having some chest discomfort although this was improving. At the time of my evaluation his blood pressure was 139/70 and he is currently asymptomatic.   Past Medical History  Diagnosis Date  . Arthritis   . Wheezing   . Sore throat   . Carotid artery occlusion   . COPD (chronic obstructive pulmonary disease) ([place])   . Lumbar disc disease   . Restless leg syndrome   . Allergic rhinitis   . Hypertension   . Anxiety     anxiety  . Shortness of breath   . Lung mass   . GERD (gastroesophageal reflux disease)   . H/O hiatal hernia   . Pre-operative cardiovascular examination   . Nonspecific abnormal unspecified cardiovascular function study   . Peripheral vascular disease, unspecified ([place])   . Pneumonia 2014    ?   Marland Kitchen On home oxygen therapy     prn  . Constipation   . 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 S/P radiation therapy 12/04/14 srs    lt frontal brain  . Cancer ([place]) 09/14/12    invasive mod diff squamous cell ca  . Metastasis to brain ([place]) 02/2015  . S/P radiation therapy 07/18/15    SRS left frontal brain 20Gy   Past Surgical History  Procedure Laterality Date  . Carotid endarterectomy  10/15/2010    left  . Hand surgery     repair of the left long, ring, and small finger after a saw injury  . Video bronchoscopy  07/24/2012    Procedure: VIDEO BRONCHOSCOPY WITH FLUORO;  Surgeon: Kathee Delton, MD;  Location: [facility];  Service: Cardiopulmonary;  Laterality: Bilateral;  . Lobectomy Right 09/14/2012    Procedure: LOBECTOMY;  Surgeon: Ivin Poot, MD;  Location: [facility];  Service: Thoracic;  Laterality: Right;  RIGHT UPPER LOBECTOMY  . Video assisted thoracoscopy Right 09/14/2012    Procedure: VIDEO ASSISTED THORACOSCOPY;  Surgeon: Ivin Poot, MD;  Location: [facility];  Service: Thoracic;  Laterality: Right;  . Video bronchoscopy with endobronchial ultrasound N/A 08/27/2014    Procedure: VIDEO BRONCHOSCOPY WITH ENDOBRONCHIAL ULTRASOUND;  Surgeon: Ivin Poot, MD;  Location: [facility];  Service: Thoracic;  Laterality: N/A;  . Scalene node biopsy Right 08/27/2014    Procedure: BIOPSY SCALENE NODE;  Surgeon: Ivin Poot, MD;  Location: [facility] OR;  Service: Thoracic;  Laterality: Right;  . Sterotactic radiosurgery Right 09/25/14    right parietal 30Gy/1 fx   Family History  Problem Relation Age of Onset  . Heart disease Father     MI  . Heart attack Father   . Alcohol abuse Mother   . Stroke Brother   . Heart disease Brother   .  Depression Sister   . Heart attack Brother    Social History  Substance Use Topics  . Smoking status: Former Smoker -- 1.00 packs/day for 55 years    Types: Cigarettes    Quit date: 08/16/2014  . Smokeless tobacco: Former Systems developer    Quit date: 04/19/1952  . Alcohol Use: Yes     Comment: occianional- not since I ve sick.    Review of Systems  All other systems reviewed and are negative.   Allergies  Review of patient's allergies indicates no known allergies.  Home Medications   Prior to Admission medications   Medication Sig Start Date End Date Taking? Authorizing Provider  amLODipine (NORVASC) 5 MG tablet Take 1 tablet (5 mg total) by mouth 2 (two) times daily. 12/18/15  Yes  Almyra Deforest, PA  aspirin EC 81 MG tablet Take 81 mg by mouth daily.    Yes Historical Provider, MD  clonazePAM (KLONOPIN) 0.5 MG tablet Take 0.5 mg by mouth 2 (two) times daily as needed for anxiety. Takes one tablet by mouth two times daily as needed for anxiety.   Yes Historical Provider, MD  dexamethasone (DECADRON) 4 MG tablet Take 1 tablet (4 mg total) by mouth 4 (four) times daily. 12/16/15  Yes Barton Dubois, MD  furosemide (LASIX) 20 MG tablet Change to Lasix 20 mg BID x 7 days; then return to 20 mg Q AM. Patient taking differently: Take 20 mg by mouth daily.  07/29/15  Yes Susanne Borders, NP  guaifenesin (HUMIBID E) 400 MG TABS tablet Take 400 mg by mouth 2 (two) times daily.   Yes Historical Provider, MD  HYDROcodone-acetaminophen (NORCO) 7.5-325 MG tablet Take 1 tablet by mouth every 6 (six) hours as needed for moderate pain. 09/25/15  Yes Heather F Boelter, NP  ipratropium-albuterol (DUONEB) 0.5-2.5 (3) MG/3ML SOLN Take 3 mLs by nebulization 4 (four) times daily. Patient taking differently: Take 3 mLs by nebulization 4 (four) times daily. Pt takes nebulizer 2 to 3 times daily. 12/18/14  Yes Noralee Space, MD  isosorbide mononitrate (IMDUR) 30 MG 24 hr tablet Take 1 tablet (30 mg total) by mouth daily. 08/12/15  Yes Jettie Booze, MD  levETIRAcetam (KEPPRA) 750 MG tablet Take 2 tablets (1,500 mg total) by mouth 2 (two) times daily. 12/16/15  Yes Barton Dubois, MD  lisinopril (PRINIVIL,ZESTRIL) 40 MG tablet Take 40 mg by mouth at bedtime.  04/23/15  Yes Historical Provider, MD  metoprolol tartrate (LOPRESSOR) 25 MG tablet Take 1 tablet (25 mg total) by mouth 2 (two) times daily. For systolic BP of 488 or greater take 50 mg BID 12/18/15  Yes Almyra Deforest, PA  omeprazole (PRILOSEC) 40 MG capsule Take 40 mg by mouth daily.   Yes Historical Provider, MD  OXYGEN Inhale 2-3 L into the lungs continuous.   Yes Historical Provider, MD  pentoxifylline (TRENTAL) 400 MG CR tablet Take 1 tablet (400 mg total) by  mouth 3 (three) times daily. 10/08/15  Yes Hayden Pedro, PA-C  pramipexole (MIRAPEX) 1 MG tablet Take 1 mg by mouth at bedtime.   Yes Historical Provider, MD  prochlorperazine (COMPAZINE) 10 MG tablet Take 10 mg by mouth every 6 (six) hours as needed for nausea or vomiting. Reported on 09/25/2015   Yes Historical Provider, MD  traMADol (ULTRAM) 50 MG tablet Take 1 tablet (50 mg total) by mouth every 12 (twelve) hours as needed. Patient taking differently: Take 50 mg by mouth daily as needed (pain).  01/29/15  Yes Laurie Panda, NP  triamcinolone cream (KENALOG) 0.1 % Apply 1 application topically 2 (two) times daily as needed (itching/ dry skin).  10/30/15  Yes Historical Provider, MD  vitamin E (VITAMIN E) 1000 UNIT capsule Take 1 capsule (1,000 Units total) by mouth daily. 10/08/15  Yes Hayden Pedro, PA-C  zolpidem (AMBIEN) 10 MG tablet Take 10 mg by mouth at bedtime as needed for sleep. Reported on 09/25/2015   Yes Historical Provider, MD   BP 141/69 mmHg  Pulse 68  Temp(Src) 97.9 F (36.6 C) (Oral)  Resp 14  SpO2 98%   Physical Exam   ED Course  Procedures (including critical care time)   EKG Interpretation   Date/Time:  Monday December 22 2015 04:00:23 EDT Ventricular Rate:  72 PR Interval:  139 QRS Duration: 104 QT Interval:  387 QTC Calculation: 423 R Axis:   60 Text Interpretation:  Sinus rhythm No significant change was found  Confirmed by Florina Ou  MD, Jenny Reichmann ([id number]) on 12/22/2015 4:08:42 AM      MDM   Nursing notes and vitals signs, including pulse oximetry, reviewed.  Summary of this visit's results, reviewed by myself:  Labs:  Results for orders placed or performed during the hospital encounter of 12/22/15 (from the past 24 hour(s))  Basic metabolic panel     Status: Abnormal   Collection Time: 12/22/15  4:29 AM  Result Value Ref Range   Sodium 132 (L) 135 - 145 mmol/L   Potassium 4.5 3.5 - 5.1 mmol/L   Chloride 95 (L) 101 - 111 mmol/L   CO2 30  22 - 32 mmol/L   Glucose, Bld 128 (H) 65 - 99 mg/dL   BUN 26 (H) 6 - 20 mg/dL   Creatinine, Ser 0.81 0.61 - 1.24 mg/dL   Calcium 8.1 (L) 8.9 - 10.3 mg/dL   GFR calc non Af Amer >60 >60 mL/min   GFR calc Af Amer >60 >60 mL/min   Anion gap 7 5 - 15  CBC     Status: Abnormal   Collection Time: 12/22/15  4:29 AM  Result Value Ref Range   WBC 8.2 4.0 - 10.5 K/uL   RBC 3.93 (L) 4.22 - 5.81 MIL/uL   Hemoglobin 12.3 (L) 13.0 - 17.0 g/dL   HCT 37.9 (L) 39.0 - 52.0 %   MCV 96.4 78.0 - 100.0 fL   MCH 31.3 26.0 - 34.0 pg   MCHC 32.5 30.0 - 36.0 g/dL   RDW 14.5 11.5 - 15.5 %   Platelets 208 150 - 400 K/uL  I-stat troponin, ED     Status: Abnormal   Collection Time: 12/22/15  4:34 AM  Result Value Ref Range   Troponin i, poc 0.10 (HH) 0.00 - 0.08 ng/mL   Comment 3          I-stat troponin, ED     Status: None   Collection Time: 12/22/15  6:33 AM  Result Value Ref Range   Troponin i, poc 0.08 0.00 - 0.08 ng/mL   Comment 3            Imaging Studies: Dg Chest 2 View  12/22/2015  CLINICAL DATA:  Shortness of breath, wheezing, chest pressure radiating to the left arm. History of hypertension. History of lung cancer metastatic to brain post right upper lobectomy. EXAM: CHEST  2 VIEW COMPARISON:  12/13/2015 FINDINGS: Power port type central venous catheter with tip over the mid SVC region. No pneumothorax. Postoperative changes in the right lung  with surgical clips and volume loss. No focal airspace disease or consolidation in the lungs. Blunting of the right costophrenic angle likely due to postoperative pleural thickening. Old right thoracotomy. Surgical clips in the base the neck. No pneumothorax. Mediastinal contours appear intact. Calcification of the aorta. IMPRESSION: Postoperative changes with associated volume loss in the right lung. No evidence of active pulmonary disease. Electronically Signed   By: Lucienne Capers M.D.   On: 12/22/2015 04:31   6:46 AM The patient continues to be  asymptomatic. His troponins are trending downward. This likely represents to band ischemia from his earlier hypertensive urgency. The patient is adamant that he does not wish to be admitted. He was advised to return should symptoms return or worsen.      Shanon Rosser, MD 12/22/15 [phone]

## 2015-12-22 NOTE — ED Notes (Signed)
Bed: FF63 Expected date:  Expected time:  Means of arrival:  Comments: EMS Wheezing / HTN

## 2015-12-22 NOTE — Progress Notes (Signed)
Follow up s/p radiation  Brain mets 07/18/15, had a seizure   And high b/p   Week before last  Saturday went to the ER, is taking Keppra now,  Went back to the ER this am, high b/p >240/92, 209/92 with ambulance, was wheezing, and given  Breathing treatments,    D/c this am after b/p controlled, patient  Weaker, and has fluid and tightness in b/l arms and legs, on oxygen 2 liters, n/c, hoarseness,  Feels pressure goes up in head  And back of neck hurting,  bluury vision , coughing uo white phlegm and green tinge , takes decadron '4mg'$  4x day and trental  Bid says sinus infection  BP 139/125 mmHg  Pulse 83  Temp(Src) 97.6 F (36.4 C) (Oral)  Resp 20  Ht '5\' 9"'$  (1.753 m)  Wt 182 lb 3.2 oz (82.645 kg)  BMI 26.89 kg/m2  SpO2 100%  Wt Readings from Last 3 Encounters:  12/22/15 182 lb 3.2 oz (82.645 kg)  12/17/15 177 lb 6.4 oz (80.468 kg)  12/16/15 181 lb 11.2 oz (82.419 kg)

## 2015-12-22 NOTE — ED Notes (Signed)
Dr.Molpus at bedside to evaluate pt.

## 2015-12-22 NOTE — ED Notes (Signed)
Pt reports understanding of discharge information. No questions at time of discharge 

## 2015-12-22 NOTE — ED Notes (Addendum)
Pt now reporting that he has been having pressure in chest with radiation into left arm. Pt denied any pain at time of triage.

## 2015-12-22 NOTE — Progress Notes (Signed)
Radiation Oncology         [phone] ________________________________  Name: TRAYLON SCHIMMING MRN: [id number]  Date: 12/22/2015  DOB: [birth year]     Follow-Up Visit Note  CC: [name], MD  Diagnosis:   Metastatic non-small cell lung cancer     Radiation treatment dates: 5 months. Completed radiation 05/28/2015 - 06/20/2015 to the right neck with 35 Gy in 14 fractions and 07/18/2015 to the left frontal lobe with 20 Gy in 1 fraction.   Narrative:  The patient returns today for routine follow-up status post radiation for brain mets 07/18/2015. He mentions that he had a seizure and high blood pressure the week before last Saturday. He went to the ER and is taking Keppra now. He went back to the ER this morning due to high blood pressure, 240>92, 209>92 with ambulance. He mentions he was wheezing and was given breathing treatments and discontinued them this morning after his blood pressure was controlled. He mentions that he feels weaker. He has fluid and tightness in bilateral arms and legs. He is on nasal canula oxygen 2 liters. He has some hoarseness. He feels pressure in his head and the back of his neck is hurting. He mentions he has some blurry vision. He mentions he is coughing up white phlegm and green tinge. He is taking Decadron 4 mg 4 x daily and trantal twice daily for sinus infection. He is accompanied by his wife.   ALLERGIES:  has No Known Allergies.  Meds: Current Outpatient Prescriptions  Medication Sig Dispense Refill  . amLODipine (NORVASC) 5 MG tablet Take 1 tablet (5 mg total) by mouth 2 (two) times daily. 180 tablet 1  . clonazePAM (KLONOPIN) 0.5 MG tablet Take 0.5 mg by mouth 2 (two) times daily as needed for anxiety. Takes one tablet by mouth two times daily as needed for anxiety.    Marland Kitchen dexamethasone (DECADRON) 4 MG tablet Take 1 tablet (4 mg total) by mouth 4 (four) times daily. 60 tablet 0  . furosemide (LASIX) 20 MG tablet Change to Lasix 20 mg  BID x 7 days; then return to 20 mg Q AM. (Patient taking differently: Take 20 mg by mouth daily. ) 45 tablet 0  . guaifenesin (HUMIBID E) 400 MG TABS tablet Take 400 mg by mouth 2 (two) times daily.    Marland Kitchen HYDROcodone-acetaminophen (NORCO) 7.5-325 MG tablet Take 1 tablet by mouth every 6 (six) hours as needed for moderate pain. 45 tablet 0  . ipratropium-albuterol (DUONEB) 0.5-2.5 (3) MG/3ML SOLN Take 3 mLs by nebulization 4 (four) times daily. (Patient taking differently: Take 3 mLs by nebulization 4 (four) times daily. Pt takes nebulizer 2 to 3 times daily.) 360 mL 2  . isosorbide mononitrate (IMDUR) 30 MG 24 hr tablet Take 1 tablet (30 mg total) by mouth daily. 30 tablet 4  . levETIRAcetam (KEPPRA) 750 MG tablet Take 2 tablets (1,500 mg total) by mouth 2 (two) times daily. 60 tablet 1  . lisinopril (PRINIVIL,ZESTRIL) 40 MG tablet Take 40 mg by mouth at bedtime.     . metoprolol tartrate (LOPRESSOR) 25 MG tablet Take 1 tablet (25 mg total) by mouth 2 (two) times daily. For systolic BP of 932 or greater take 50 mg BID 240 tablet 1  . omeprazole (PRILOSEC) 40 MG capsule Take 40 mg by mouth daily.    . OXYGEN Inhale 2-3 L into the lungs continuous.    . pentoxifylline (TRENTAL) 400 MG CR tablet Take 1  tablet (400 mg total) by mouth 3 (three) times daily. 90 tablet 12  . pramipexole (MIRAPEX) 1 MG tablet Take 1 mg by mouth at bedtime.    . traMADol (ULTRAM) 50 MG tablet Take 1 tablet (50 mg total) by mouth every 12 (twelve) hours as needed. (Patient taking differently: Take 50 mg by mouth daily as needed (pain). ) 30 tablet 0  . triamcinolone cream (KENALOG) 0.1 % Apply 1 application topically 2 (two) times daily as needed (itching/ dry skin).     . vitamin E (VITAMIN E) 1000 UNIT capsule Take 1 capsule (1,000 Units total) by mouth daily. 30 capsule 12  . zolpidem (AMBIEN) 10 MG tablet Take 10 mg by mouth at bedtime as needed for sleep. Reported on 09/25/2015    . aspirin EC 81 MG tablet Take 81 mg by  mouth daily. Reported on 12/22/2015    . cloNIDine (CATAPRES) 0.1 MG tablet Take 1 tablet (0.1 mg total) by mouth 2 (two) times daily as needed. When systolic blood pressure above 160 60 tablet 5  . prochlorperazine (COMPAZINE) 10 MG tablet Take 10 mg by mouth every 6 (six) hours as needed for nausea or vomiting. Reported on 12/22/2015     No current facility-administered medications for this encounter.   Facility-Administered Medications Ordered in Other Encounters  Medication Dose Route Frequency Provider Last Rate Last Dose  . sodium chloride 0.9 % injection 10 mL  10 mL Intracatheter PRN Curt Bears, MD   10 mL at 01/29/15 3235    Physical Findings: The patient is in no acute distress. Patient is alert and oriented.  height is '5\' 9"'$  (1.753 m) and weight is 182 lb 3.2 oz (82.645 kg). His oral temperature is 97.6 F (36.4 C). His blood pressure is 139/125 and his pulse is 83. His respiration is 20 and oxygen saturation is 100%. .    Lab Findings: Lab Results  Component Value Date   WBC 8.2 12/22/2015   HGB 12.3* 12/22/2015   HCT 37.9* 12/22/2015   MCV 96.4 12/22/2015   PLT 208 12/22/2015     Radiographic Findings: Dg Chest 2 View  12/22/2015  CLINICAL DATA:  Shortness of breath, wheezing, chest pressure radiating to the left arm. History of hypertension. History of lung cancer metastatic to brain post right upper lobectomy. EXAM: CHEST  2 VIEW COMPARISON:  12/13/2015 FINDINGS: Power port type central venous catheter with tip over the mid SVC region. No pneumothorax. Postoperative changes in the right lung with surgical clips and volume loss. No focal airspace disease or consolidation in the lungs. Blunting of the right costophrenic angle likely due to postoperative pleural thickening. Old right thoracotomy. Surgical clips in the base the neck. No pneumothorax. Mediastinal contours appear intact. Calcification of the aorta. IMPRESSION: Postoperative changes with associated volume loss  in the right lung. No evidence of active pulmonary disease. Electronically Signed   By: Lucienne Capers M.D.   On: 12/22/2015 04:31   Ct Head Wo Contrast  12/13/2015  CLINICAL DATA:  Metastatic lung cancer to the brain, seizure activity EXAM: CT HEAD WITHOUT CONTRAST TECHNIQUE: Contiguous axial images were obtained from the base of the skull through the vertex without intravenous contrast. COMPARISON:  10/06/2015, 06/25/2015 FINDINGS: Known high right parietal lesion is difficult to visualize without contrast. Compared to the prior brain MRI 10/06/2015, there does appear to be increased surrounding vasogenic edema and mass effect with localized sulcal effacement in the right parietal lobe. Vasogenic edema extends into the adjacent  white matter in a larger pattern than the previous MRI. The other smaller brain lesions by MRI are not demonstrated by noncontrast CT. No acute intracranial hemorrhage, midline shift, herniation, hydrocephalus, or extra-axial fluid collection. Cisterns are patent. No cerebellar abnormality appreciated. Orbits are symmetric. Mastoids and sinuses remain clear.  Skull appears intact. Exam is limited with some motion artifact. IMPRESSION: Increased right parietal lobe cortical and white matter edema pattern about the known right parietal metastasis with localized mass effect and sulcal effacement. No associated intracranial hemorrhage Other known small brain metastases are not well demonstrated by noncontrast imaging. No hydrocephalus or significant herniation. These results were called by telephone at the time of interpretation on 12/13/2015 at 2:22 pm to Dr. Charlesetta Shanks , who verbally acknowledged these results. Electronically Signed   By: Jerilynn Mages.  Shick M.D.   On: 12/13/2015 14:33   Mr Jeri Cos RC Contrast  12/14/2015  CLINICAL DATA:  Initial evaluation for acute left-sided weakness and seizure. History of metastatic disease. EXAM: MRI HEAD WITHOUT AND WITH CONTRAST TECHNIQUE: Multiplanar,  multiecho pulse sequences of the brain and surrounding structures were obtained without and with intravenous contrast. CONTRAST:  57m MULTIHANCE GADOBENATE DIMEGLUMINE 529 MG/ML IV SOLN COMPARISON:  Prior CT from earlier the same day as well as previous MRI from 10/06/2015. FINDINGS: Cerebral volume is stable. Mild confluent T2/FLAIR hyperintensity within the periventricular white matter most consistent with chronic small vessel ischemic disease. Chronic small vessel ischemic changes present within the pons as well. These changes are stable relative to prior. Metastatic lesion at the right parietal lobe is increased in size now measuring 2.1 x 2.4 x 2.2 cm (series 13, image 40, previously 1.1 x 1.3 x 1.2 cm). Surrounding vasogenic edema it is worsened relative to most recent MRI. No internal hemorrhage on gradient echo sequence. Additional lesion along the gray-white matter junction within the left frontal lobe is also increased in size, now measuring 8 mm (series 13, image 31), previously 5 mm). Surrounding edema about this lesion has also worsened. No interval hemorrhage. Additional small lesion along the anterior inferior left frontal convexity measuring 6 mm is stable the slightly increased in size (series 13, image 23). Additional faint lesion within the right cerebellar hemisphere measuring approximately 4 mm is not significantly changed. No definite new lesions. Overall, findings are consistent with progressive disease. No evidence for acute infarct. Major intracranial vascular flow voids are preserved. Gray-white matter differentiation otherwise maintained. No other changes related to seizure. No acute or chronic intracranial hemorrhage. No midline shift. Basilar cisterns are patent. No hydrocephalus. No extra-axial fluid collection. Major dural sinuses are grossly patent. Craniocervical junction normal. Visualized upper cervical spine unremarkable. Pituitary gland within normal limits. No acute abnormality  about the orbits. Patient is status post lens extraction on the right. Scattered mucosal thickening within the paranasal sinuses with fluid level within the lateral recess of the right sphenoid sinus. Bilateral mastoid effusions. Fluid within the nasopharynx. Patient is likely intubated. Bone marrow signal intensity within normal limits. No scalp soft tissue abnormality. IMPRESSION: 1. Overall interval progression of disease, with increased size of right parietal and left frontal lobe lesions as above with increased associated edema. 2. Additional smaller lesions within the anterior left frontal lobe and right cerebellum are overall not significantly changed in size. No definite new lesions identified. 3. No other acute intracranial process. Electronically Signed   By: BJeannine BogaM.D.   On: 12/14/2015 03:33   Dg Chest Port 1 View  12/13/2015  CLINICAL  DATA:  Left-sided weakness, seizure activity, intubated EXAM: PORTABLE CHEST 1 VIEW COMPARISON:  06/20/2015 FINDINGS: Endotracheal tube 4.1 cm above the carina. Right IJ power port catheter tip mid SVC. Monitor leads and defibrillator pads noted across the chest. Postop changes from previous right upper lobectomy with dense apical consolidation on the right. Stable chronic volume loss in the right hemi thorax. No superimposed edema, significant collapse or consolidation. No pneumothorax. Aortic atherosclerosis noted. IMPRESSION: Endotracheal tube 4 cm above the carina. Otherwise stable postoperative appearance of the chest. No superimposed definite acute process. Electronically Signed   By: Jerilynn Mages.  Shick M.D.   On: 12/13/2015 14:17    Impression: Mr. Weider is a 75 yo male with metastatic non-small cell lung cancer. He is recovering well from radiation. We discussed his most recent brain MRI and the progression of lesions in the right parietal and left frontal lobe with increased associated edema.  Plan: He has a CT of the chest and abdomen scheduled  12/29/2015.   We discussed that surgery is an option to treat the growing lesions in the parietal and front lobe. We will discuss with Dr. Saintclair Halsted this week to discuss possible resection. We have reviewed in detail his recent imaging in relation to his prior treatment. It appears that this enlarging area represents a new lesion very close to a prior dominant. It is very close, but a fusion of his prior treatment with his current MRI scan demonstrates that this appears to be the case.  In terms of treatment, if the lesion can be resected then I believe a preoperative course of radiation treatment will be feasible and should result in a good chance for local control. If resection is not feasible, then I believe a definitive course of treatment would remain an option as well. Some overlap would exist with his prior treatment in either of these situations, but given the small area of prior treatment and the timeframe since this was completed, I believe this would likely be reasonable to ensure the greatest chance for local control. We will clarify this further within the next couple of days.  We discussed tapering his Decadron. He will continue taking 4 mg 4 times daily and starting Wednesday, he will start taking 4 mg 3 times daily.    ------------------------------------------------  Jodelle Gross, MD, PhD    This document serves as a record of services personally performed by Kyung Rudd, MD. It was created on his behalf by  Lendon Collar, a trained medical scribe. The creation of this record is based on the scribe's personal observations and the provider's statements to them. This document has been checked and approved by the attending provider.

## 2015-12-22 NOTE — ED Notes (Addendum)
Pt laying in bed no acute distress with family at bedside, currently awaiting lab results and disposition.

## 2015-12-22 NOTE — Progress Notes (Addendum)
Called to room for patient incoming via EMS on CPAP. Upon arrival, patient speaking in full sentences past the CPAP mask/pressure. EMS states CPAP set at 5cm with nebulizer in line. Removed from CPAP and placed on 3L nasal canula. Diminished BBS. Patient states "feels much better". Wife at bedside states patient has lung cancer and is on oxygen at all times, usually 2 liters. Also supposed to use nebulizer several times daily but has been noncompliant with most of that. Pulse ox difficult to obtain due to cold hands/fingers and discoloration of nail beds, which patient states is secondary to chemo. Bilateral LE edema noted. RN at bedside.  Per EMS, 7.'5mg'$  albuterol and '1mg'$  atrovent given prior to arrival as well as '125mg'$  solumedrol.

## 2015-12-23 ENCOUNTER — Telehealth: Payer: Self-pay | Admitting: Interventional Cardiology

## 2015-12-23 ENCOUNTER — Encounter: Payer: Self-pay | Admitting: Radiation Oncology

## 2015-12-23 MED ORDER — CLONIDINE HCL 0.1 MG PO TABS: 0.1000 mg | ORAL_TABLET | Freq: Two times a day (BID) | ORAL | Status: DC | PRN

## 2015-12-23 NOTE — Telephone Encounter (Signed)
Wife states that pt was seen in ER yesterday due to elevated BP. Checked BP and it was 240/120, took Metoprolol, came down to 166/83 and wife called 911 because she felt this was still too high. Wife states BP today has been 160-200/70-100. Pt took Metoprolol 30-40 mins prior to call, checked BP while on the phone and it was 188/89, HR 73. Spoke with pt at this time and he denies HA, lightheadedness, CP or dizziness. Pt states that he has some chest pressure occasionally.  Pt does have pressure in his head when BP elevated but no pain.  Wife states Metoprolol brings BP down for a short time but then it shoots right back up. Pt is taking Amlodipine '5mg'$  BID, Lisinopril '40mg'$  QD and Metoprolol '25mg'$  BID plus an extra Metoprolol 1-2 times a day when BP elevated. Wife also mentioned they are wanting to do brain surgery soon to remove the tumor and she wanted to mention this because we will be receiving clearance from them.   Just spoke with wife to get updated BP since it has been over an hour now since pt took extra Metoprolol and BP is now 181/85.   Will route to Dr. Irish Lack for review and advisement.

## 2015-12-23 NOTE — Telephone Encounter (Signed)
New message   [facility] calling   Request for surgical clearance:  1. What type of surgery is being performed? Crani - remove brain tumor   2. When is this surgery scheduled? Pending  6/27   3. Are there any medications that need to be held prior to surgery and how long?  No   4. Name of physician performing surgery? Dr. Stevenson Clinch    5. What is your office phone and fax number? [phone]- fax  /  [phone] - phone

## 2015-12-23 NOTE — Telephone Encounter (Signed)
New message    Request for surgical clearance:  1. What type of surgery is being performed? Crani remove brain tumor  2. When is this surgery scheduled? Pending 6.27  3. Are there any medications that need to be held prior to surgery and how long? No   4. Name of physician performing surgery? Dr. Kary Kos    5. What is your office phone and fax number? [phone]/ fax [phone]

## 2015-12-23 NOTE — Telephone Encounter (Signed)
addendum    Per request from Clinical Supervisor to separate note due to surgery clearance .

## 2015-12-23 NOTE — Telephone Encounter (Signed)
Can use clonidine 0.1 mg BID prn SBP > 160 mm Hg.

## 2015-12-23 NOTE — Telephone Encounter (Signed)
Spoke with wife and informed her of new orders per Dr. Irish Lack. Verified pharmacy and sent in prescription. Advised on possible side effects and when to give Clonidine. Wife verbalized understanding and was in agreement with this plan.

## 2015-12-23 NOTE — Telephone Encounter (Signed)
New message   Pt c/o BP issue:  1. What are your last 5 BP readings?  Today 12/23/2015 BP readings   6a 160/77  9:40 165/79 12:30 174/72 1/30 190/95  Yesterday 12/22/2015 BP readings   2:12a 209/101  Taking metoprolol it goes down but it shoots right back up   2. Are you having any other symptoms (ex. Dizziness, headache, blurred vision, passed out)? Seizure this past weekend. Pressure in his head which they believe to be the swelling around the cancer.    3. What is your medication issue? Not sure what the issue is.

## 2015-12-23 NOTE — Telephone Encounter (Signed)
Will fax clearance over to Dr. Windy Carina office.

## 2015-12-23 NOTE — Telephone Encounter (Signed)
No further cardiac testing needed before surgery.  He is at moderate risk of cardiac complications (approximately 3%) due to his known CAD, but this risk cannot be modified.

## 2015-12-24 ENCOUNTER — Encounter: Payer: Self-pay | Admitting: Diagnostic Neuroimaging

## 2015-12-24 ENCOUNTER — Other Ambulatory Visit (HOSPITAL_BASED_OUTPATIENT_CLINIC_OR_DEPARTMENT_OTHER): Payer: Medicare Other

## 2015-12-24 ENCOUNTER — Ambulatory Visit (INDEPENDENT_AMBULATORY_CARE_PROVIDER_SITE_OTHER): Payer: Medicare Other | Admitting: Diagnostic Neuroimaging

## 2015-12-24 ENCOUNTER — Other Ambulatory Visit: Payer: Medicare Other

## 2015-12-24 ENCOUNTER — Ambulatory Visit: Payer: Self-pay | Admitting: Diagnostic Neuroimaging

## 2015-12-24 VITALS — BP 159/75 | HR 58 | Ht 69.0 in | Wt 181.0 lb

## 2015-12-24 DIAGNOSIS — I248 Other forms of acute ischemic heart disease: Secondary | ICD-10-CM

## 2015-12-24 DIAGNOSIS — R569 Unspecified convulsions: Secondary | ICD-10-CM

## 2015-12-24 DIAGNOSIS — C3411 Malignant neoplasm of upper lobe, right bronchus or lung: Secondary | ICD-10-CM

## 2015-12-24 DIAGNOSIS — C3491 Malignant neoplasm of unspecified part of right bronchus or lung: Secondary | ICD-10-CM

## 2015-12-24 DIAGNOSIS — C7931 Secondary malignant neoplasm of brain: Secondary | ICD-10-CM | POA: Diagnosis not present

## 2015-12-24 LAB — COMPREHENSIVE METABOLIC PANEL
ALT: 11 U/L (ref 0–55)
ANION GAP: 6 meq/L (ref 3–11)
AST: 12 U/L (ref 5–34)
Albumin: 3 g/dL — ABNORMAL LOW (ref 3.5–5.0)
Alkaline Phosphatase: 43 U/L (ref 40–150)
BUN: 20.4 mg/dL (ref 7.0–26.0)
CALCIUM: 8 mg/dL — AB (ref 8.4–10.4)
CHLORIDE: 95 meq/L — AB (ref 98–109)
CO2: 32 mEq/L — ABNORMAL HIGH (ref 22–29)
Creatinine: 0.8 mg/dL (ref 0.7–1.3)
EGFR: 90 mL/min/{1.73_m2} — ABNORMAL LOW (ref >90)
Glucose: 109 mg/dl (ref 70–140)
POTASSIUM: 4.6 meq/L (ref 3.5–5.1)
Sodium: 133 mEq/L — ABNORMAL LOW (ref 136–145)
Total Bilirubin: 0.3 mg/dL (ref 0.20–1.20)
Total Protein: 5.4 g/dL — ABNORMAL LOW (ref 6.4–8.3)

## 2015-12-24 LAB — CBC WITH DIFFERENTIAL/PLATELET
BASO%: 0 % (ref 0.0–2.0)
BASOS ABS: 0 10*3/uL (ref 0.0–0.1)
EOS%: 0 % (ref 0.0–7.0)
Eosinophils Absolute: 0 10*3/uL (ref 0.0–0.5)
HEMATOCRIT: 37.8 % — AB (ref 38.4–49.9)
HGB: 12.2 g/dL — ABNORMAL LOW (ref 13.0–17.1)
LYMPH#: 0.9 10*3/uL (ref 0.9–3.3)
LYMPH%: 12.9 % — ABNORMAL LOW (ref 14.0–49.0)
MCH: 31.1 pg (ref 27.2–33.4)
MCHC: 32.3 g/dL (ref 32.0–36.0)
MCV: 96.4 fL (ref 79.3–98.0)
MONO#: 0.6 10*3/uL (ref 0.1–0.9)
MONO%: 8.6 % (ref 0.0–14.0)
NEUT#: 5.5 10*3/uL (ref 1.5–6.5)
NEUT%: 78.5 % — AB (ref 39.0–75.0)
PLATELETS: 158 10*3/uL (ref 140–400)
RBC: 3.92 10*6/uL — ABNORMAL LOW (ref 4.20–5.82)
RDW: 14.6 % (ref 11.0–14.6)
WBC: 7 10*3/uL (ref 4.0–10.3)

## 2015-12-24 MED ORDER — LEVETIRACETAM 750 MG PO TABS: 1500.0000 mg | ORAL_TABLET | Freq: Two times a day (BID) | ORAL | Status: AC

## 2015-12-24 NOTE — Patient Instructions (Addendum)
Thank you for coming to see [place] at [facility]. I hope we have been able to provide you high quality care today.  You may receive a patient satisfaction survey over the next few weeks. We would appreciate your feedback and comments so that we may continue to improve ourselves and the health of our patients.  - visit epilepsy.com for more information  - According to  law, you can not drive unless you are seizure free for at least 6 months and under physician's care.   - Please maintain seizure precautions. Do not participate in activities where a loss of awareness could harm you or someone else. No swimming alone, no tub bathing, no hot tubs, no driving, no operating motorized vehicles (cars, ATVs, motocycles, etc), lawnmowers or power tools. No standing at heights, such as rooftops, ladders or stairs. Avoid hot objects such as stoves, heaters, open fires. Wear a helmet when riding a bicycle, scooter, skateboard, etc. and avoid areas of traffic. Set your water heater to 120 degrees or less.    ~~~~~~~~~~~~~~~~~~~~~~~~~~~~~~~~~~~~~~~~~~~~~~~~~~~~~~~~~~~~~~~~~  DR. PENUMALLI'S GUIDE TO HAPPY AND HEALTHY LIVING These are some of my general health and wellness recommendations. Some of them may apply to you better than others. Please use common sense as you try these suggestions and feel free to ask me any questions.   ACTIVITY/FITNESS Mental, social, emotional and physical stimulation are very important for brain and body health. Try learning a new activity (arts, music, language, sports, games).  Keep moving your body to the best of your abilities. You can do this at home, inside or outside, the park, community center, gym or anywhere you like. Consider a physical therapist or personal trainer to get started. Consider the app Sworkit. Fitness trackers such as smart-watches, smart-phones or Fitbits can help as well.   NUTRITION Eat more plants: colorful vegetables, nuts, seeds  and berries.  Eat less sugar, salt, preservatives and processed foods.  Avoid toxins such as cigarettes and alcohol.  Drink water when you are thirsty. Warm water with a slice of lemon is an excellent morning drink to start the day.  Consider these websites for more information The Nutrition Source (https://www.henry-hernandez.biz/) Precision Nutrition (WindowBlog.ch)   RELAXATION Consider practicing mindfulness meditation or other relaxation techniques such as deep breathing, prayer, yoga, tai chi, massage. See website mindful.org or the apps Headspace or Calm to help get started.   SLEEP Try to get at least 7-8+ hours sleep per day. Regular exercise and reduced caffeine will help you sleep better. Practice good sleep hygeine techniques. See website sleep.org for more information.   PLANNING Prepare estate planning, living will, healthcare POA documents. Sometimes this is best planned with the help of an attorney. Theconversationproject.org and agingwithdignity.org are excellent resources.

## 2015-12-24 NOTE — Progress Notes (Signed)
[facility]  PATIENT: [name] DOB: Jun 07, 1941  REFERRING CLINICIAN: Kirkpatrick HISTORY FROM: patient and wife  REASON FOR VISIT: new consult   HISTORICAL  CHIEF COMPLAINT:  Chief Complaint  Patient presents with  . Seizures    rm 7, New Pt, wife- [name], "hx lung cancer with brain metastisis, s/p chemo, xrt to brain"    HISTORY OF PRESENT ILLNESS:   75 year old right-handed male here for evaluation of seizure. 12/13/15 patient felt lightheaded, faint, numbness on the left side of his body. This then progressed to convulsions on the left side. He then had altered consciousness. Patient was taken to the hospital by his wife and evaluated. Patient was found to have slight progression of metastatic brain lesions. Patient was started on levetiracetam 1500 mg twice a day. Since discharge no further seizures or convulsions. Patient tolerating medication well. No prior history of seizures.   Oncology history was reviewed from Dr. Worthy Flank note (12/17/15): "PRIOR THERAPY:  1) Status post right upper lobectomy under the care of Dr. Prescott Gum on 09/14/2012.  2) Adjuvant chemotherapy with cisplatin 75 mg/M2 and docetaxel 75 mg/M2 with Neulasta support every 3 weeks, status post 1 cycle. Starting with cycle #2, patient will receive carboplatin for an AUC initially of 4.5 and paclitaxel at 175 mg per meter squared with Neulasta support given every 3 weeks. Status post a total of 3 cycles.  3) Systemic chemotherapy with carboplatin for AUC of 5 on day 1 and gemcitabine 1000 MG/M2 on days 1 and 8 every 3 weeks. First dose 09/11/2014. Status post 4 cycles. Discontinued secondary to intolerance 4) Immunotherapy with Nivolumab 3 MG/KG every 2 weeks. Status post 4 cycles. 5) stereotactic radiosurgery to multiple brain metastasis performed on 03/12/2015."   REVIEW OF SYSTEMS: Full 14 system review of systems performed and negative with exception of: snoring restless legs  weakness seizure tremor feeling cold easy bruising blurred vision shortness of breath cough wheezing constipation swelling in legs weight gain fatigue.  ALLERGIES: No Known Allergies  HOME MEDICATIONS: Outpatient Prescriptions Prior to Visit  Medication Sig Dispense Refill  . amLODipine (NORVASC) 5 MG tablet Take 1 tablet (5 mg total) by mouth 2 (two) times daily. 180 tablet 1  . aspirin EC 81 MG tablet Take 81 mg by mouth daily. Reported on 12/22/2015    . clonazePAM (KLONOPIN) 0.5 MG tablet Take 0.5 mg by mouth 2 (two) times daily as needed for anxiety. Takes one tablet by mouth two times daily as needed for anxiety.    . cloNIDine (CATAPRES) 0.1 MG tablet Take 1 tablet (0.1 mg total) by mouth 2 (two) times daily as needed. When systolic blood pressure above 160 60 tablet 5  . dexamethasone (DECADRON) 4 MG tablet Take 1 tablet (4 mg total) by mouth 4 (four) times daily. 60 tablet 0  . furosemide (LASIX) 20 MG tablet Change to Lasix 20 mg BID x 7 days; then return to 20 mg Q AM. (Patient taking differently: Take 20 mg by mouth daily. ) 45 tablet 0  . guaifenesin (HUMIBID E) 400 MG TABS tablet Take 400 mg by mouth 2 (two) times daily.    Marland Kitchen HYDROcodone-acetaminophen (NORCO) 7.5-325 MG tablet Take 1 tablet by mouth every 6 (six) hours as needed for moderate pain. 45 tablet 0  . ipratropium-albuterol (DUONEB) 0.5-2.5 (3) MG/3ML SOLN Take 3 mLs by nebulization 4 (four) times daily. (Patient taking differently: Take 3 mLs by nebulization 4 (four) times daily. Pt takes nebulizer 2 to  3 times daily.) 360 mL 2  . isosorbide mononitrate (IMDUR) 30 MG 24 hr tablet Take 1 tablet (30 mg total) by mouth daily. 30 tablet 4  . levETIRAcetam (KEPPRA) 750 MG tablet Take 2 tablets (1,500 mg total) by mouth 2 (two) times daily. 60 tablet 1  . lisinopril (PRINIVIL,ZESTRIL) 40 MG tablet Take 40 mg by mouth at bedtime.     . metoprolol tartrate (LOPRESSOR) 25 MG tablet Take 1 tablet (25 mg total) by mouth 2 (two) times  daily. For systolic BP of 101 or greater take 50 mg BID 240 tablet 1  . omeprazole (PRILOSEC) 40 MG capsule Take 40 mg by mouth daily.    . OXYGEN Inhale 2-3 L into the lungs continuous.    . pramipexole (MIRAPEX) 1 MG tablet Take 1 mg by mouth at bedtime.    . prochlorperazine (COMPAZINE) 10 MG tablet Take 10 mg by mouth every 6 (six) hours as needed for nausea or vomiting. Reported on 12/22/2015    . traMADol (ULTRAM) 50 MG tablet Take 1 tablet (50 mg total) by mouth every 12 (twelve) hours as needed. (Patient taking differently: Take 50 mg by mouth daily as needed (pain). ) 30 tablet 0  . triamcinolone cream (KENALOG) 0.1 % Apply 1 application topically 2 (two) times daily as needed (itching/ dry skin).     Marland Kitchen zolpidem (AMBIEN) 10 MG tablet Take 10 mg by mouth at bedtime as needed for sleep. Reported on 09/25/2015    . pentoxifylline (TRENTAL) 400 MG CR tablet Take 1 tablet (400 mg total) by mouth 3 (three) times daily. (Patient not taking: Reported on 12/24/2015) 90 tablet 12  . vitamin E (VITAMIN E) 1000 UNIT capsule Take 1 capsule (1,000 Units total) by mouth daily. 30 capsule 12   Facility-Administered Medications Prior to Visit  Medication Dose Route Frequency Provider Last Rate Last Dose  . sodium chloride 0.9 % injection 10 mL  10 mL Intracatheter PRN Curt Bears, MD   10 mL at 01/29/15 0944    PAST MEDICAL HISTORY: Past Medical History  Diagnosis Date  . Arthritis   . Wheezing   . Sore throat   . Carotid artery occlusion   . COPD (chronic obstructive pulmonary disease) ([place])   . Lumbar disc disease   . Restless leg syndrome   . Allergic rhinitis   . Hypertension   . Anxiety     anxiety  . Shortness of breath   . Lung mass   . GERD (gastroesophageal reflux disease)   . H/O hiatal hernia   . Pre-operative cardiovascular examination   . Nonspecific abnormal unspecified cardiovascular function study   . Peripheral vascular disease, unspecified ([place])   . Pneumonia 2014     ?   Marland Kitchen On home oxygen therapy     prn  . Constipation   . 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 S/P radiation therapy 12/04/14 srs    lt frontal brain  . Cancer ([place]) 09/14/12    invasive mod diff squamous cell ca  . Metastasis to brain ([place]) 02/2015  . S/P radiation therapy 07/18/15    SRS left frontal brain 20Gy  . Seizures ([place]) 12/13/15    PAST SURGICAL HISTORY: Past Surgical History  Procedure Laterality Date  . Carotid endarterectomy  10/15/2010    left  . Hand surgery      repair of the left long, ring, and small finger after a saw injury  . Video  bronchoscopy  07/24/2012    Procedure: VIDEO BRONCHOSCOPY WITH FLUORO;  Surgeon: Kathee Delton, MD;  Location: [facility];  Service: Cardiopulmonary;  Laterality: Bilateral;  . Lobectomy Right 09/14/2012    Procedure: LOBECTOMY;  Surgeon: Ivin Poot, MD;  Location: [facility];  Service: Thoracic;  Laterality: Right;  RIGHT UPPER LOBECTOMY  . Video assisted thoracoscopy Right 09/14/2012    Procedure: VIDEO ASSISTED THORACOSCOPY;  Surgeon: Ivin Poot, MD;  Location: [facility];  Service: Thoracic;  Laterality: Right;  . Video bronchoscopy with endobronchial ultrasound N/A 08/27/2014    Procedure: VIDEO BRONCHOSCOPY WITH ENDOBRONCHIAL ULTRASOUND;  Surgeon: Ivin Poot, MD;  Location: [facility];  Service: Thoracic;  Laterality: N/A;  . [place] node biopsy Right 08/27/2014    Procedure: BIOPSY SCALENE NODE;  Surgeon: Ivin Poot, MD;  Location: [facility];  Service: Thoracic;  Laterality: Right;  . Sterotactic radiosurgery Right 09/25/14    right parietal 30Gy/1 fx    FAMILY HISTORY: Family History  Problem Relation Age of Onset  . Heart disease Father     MI  . Heart attack Father   . Alcohol abuse Mother   . Stroke Brother   . Heart disease Brother   . Depression Sister   . Heart attack Brother     SOCIAL HISTORY:  Social History   Social History  . Marital Status: Married    Spouse Name: [name]  . Number of Children:  Y  . Years of Education: N/A   Occupational History  . worked in Education administrator    Social History Main Topics  . Smoking status: Former Smoker -- 1.00 packs/day for 55 years    Types: Cigarettes    Quit date: 08/16/2014  . Smokeless tobacco: Former Systems developer    Quit date: 04/19/1952  . Alcohol Use: Yes     Comment: occianional- not since I ve sick.  . Drug Use: No  . Sexual Activity: Not on file   Other Topics Concern  . Not on file   Social History Narrative     PHYSICAL EXAM  GENERAL EXAM/CONSTITUTIONAL: Vitals:  Filed Vitals:   12/24/15 1049  BP: 159/75  Pulse: 58  Height: '5\' 9"'$  (1.753 m)  Weight: 181 lb (82.101 kg)     Body mass index is 26.72 kg/(m^2).  No exam data present  Patient is in no distress; well developed, nourished and groomed; neck is supple  WEARING OXYGEN VIA [place]  CARDIOVASCULAR:  Examination of carotid arteries is normal; no carotid bruits  Regular rate and rhythm, no murmurs  Examination of peripheral vascular system by observation and palpation is normal  EYES:  Ophthalmoscopic exam of optic discs and posterior segments is normal; no papilledema or hemorrhages  MUSCULOSKELETAL:  Gait, strength, tone, movements noted in Neurologic exam below  NEUROLOGIC: MENTAL STATUS:  No flowsheet data found.  awake, alert, oriented to person, place and time  recent and remote memory intact  normal attention and concentration  language fluent, comprehension intact, naming intact,   fund of knowledge appropriate  CRANIAL NERVE:   2nd - no papilledema on fundoscopic exam  2nd, 3rd, 4th, 6th - pupils equal and reactive to light, visual fields full to confrontation, extraocular muscles intact, no nystagmus  5th - facial sensation symmetric  7th - facial strength symmetric  8th - hearing intact  9th - palate elevates symmetrically, uvula midline  11th - shoulder shrug symmetric  12th - tongue protrusion midline  MOTOR:   normal bulk  and tone, full strength in the BUE, BLE  SENSORY:   normal and symmetric to light touch, temperature, vibration  COORDINATION:   finger-nose-finger, fine finger movements normal  REFLEXES:   deep tendon reflexes TRACE and symmetric; ABSENT AT ANKLES  GAIT/STATION:   narrow based gait;SLOW CAUTIOUS GAIT    DIAGNOSTIC DATA (LABS, IMAGING, TESTING) - I reviewed patient records, labs, notes, testing and imaging myself where available.  Lab Results  Component Value Date   WBC 7.0 12/24/2015   HGB 12.2* 12/24/2015   HCT 37.8* 12/24/2015   MCV 96.4 12/24/2015   PLT 158 12/24/2015      Component Value Date/Time   NA 133* 12/24/2015 0904   NA 132* 12/22/2015 0429   K 4.6 12/24/2015 0904   K 4.5 12/22/2015 0429   CL 95* 12/22/2015 0429   CL 101 12/28/2012 0852   CO2 32* 12/24/2015 0904   CO2 30 12/22/2015 0429   GLUCOSE 109 12/24/2015 0904   GLUCOSE 128* 12/22/2015 0429   GLUCOSE 117* 12/28/2012 0852   BUN 20.4 12/24/2015 0904   BUN 26* 12/22/2015 0429   CREATININE 0.8 12/24/2015 0904   CREATININE 0.81 12/22/2015 0429   CALCIUM 8.0* 12/24/2015 0904   CALCIUM 8.1* 12/22/2015 0429   PROT 5.4* 12/24/2015 0904   PROT 6.3* 12/13/2015 1658   ALBUMIN 3.0* 12/24/2015 0904   ALBUMIN 3.3* 12/13/2015 1658   AST 12 12/24/2015 0904   AST 19 12/13/2015 1658   ALT 11 12/24/2015 0904   ALT 9* 12/13/2015 1658   ALKPHOS 43 12/24/2015 0904   ALKPHOS 49 12/13/2015 1658   BILITOT 0.30 12/24/2015 0904   BILITOT 0.8 12/13/2015 1658   GFRNONAA >60 12/22/2015 0429   GFRAA >60 12/22/2015 0429   Lab Results  Component Value Date   CHOL 145 01/17/2011   HDL 57 01/17/2011   LDLCALC 75 01/17/2011   TRIG 66 01/17/2011   CHOLHDL 2.5 01/17/2011   Lab Results  Component Value Date   HGBA1C 6.6* 11/11/2014   No results found for: [id number] Lab Results  Component Value Date   TSH 2.290 04/28/2015    12/15/15 EEG  - consistent with an area of potential epileptogenicity in the left  frontocentral region. No seizure was recorded on this study.  12/14/15 MRI brain: 1. Overall interval progression of disease, with increased size of right parietal and left frontal lobe lesions as above with increased associated edema. 2. Additional smaller lesions within the anterior left frontal lobe and right cerebellum are overall not significantly changed in size. No definite new lesions identified. 3. No other acute intracranial process.    ASSESSMENT AND PLAN  75 y.o. year old male here with metastatic lung cancer with metastases to the brain, now with new onset seizure (partial onset with secondary generalization).   Ddx:  1. Seizure ([place])   2. Brain metastases ([place])      PLAN: - continue levetiracetam '1500mg'$  twice a day - no driving until seizure free x 6 months - seizure safety/supervision reviewed  Meds ordered this encounter  Medications  . levETIRAcetam (KEPPRA) 750 MG tablet    Sig: Take 2 tablets (1,500 mg total) by mouth 2 (two) times daily.    Dispense:  360 tablet    Refill:  4   Return in about 3 months (around 03/25/2016).  I reviewed images, labs, notes, records myself. I summarized findings and reviewed with patient, for this high risk condition (seizure disorder; metastatic lung CA with brain lesions) requiring high  complexity decision making.     Penni Bombard, MD 02/16/8109, 31:59 AM Certified in Neurology, Neurophysiology and Neuroimaging  [facility] [address] [place], [place] [address] [phone]

## 2015-12-25 ENCOUNTER — Encounter: Payer: Self-pay | Admitting: Pulmonary Disease

## 2015-12-25 ENCOUNTER — Other Ambulatory Visit: Payer: Self-pay | Admitting: Neurosurgery

## 2015-12-25 ENCOUNTER — Ambulatory Visit
Admission: RE | Admit: 2015-12-25 | Discharge: 2015-12-25 | Disposition: A | Discharge status: Home / Self Care | Payer: Medicare Other | Source: Ambulatory Visit | Attending: Radiation Oncology | Admitting: Radiation Oncology

## 2015-12-25 ENCOUNTER — Ambulatory Visit (INDEPENDENT_AMBULATORY_CARE_PROVIDER_SITE_OTHER): Payer: Medicare Other | Admitting: Pulmonary Disease

## 2015-12-25 VITALS — BP 152/70 | HR 63 | Temp 97.7°F | Ht 69.0 in | Wt 184.6 lb

## 2015-12-25 VITALS — BP 183/72 | HR 61 | Temp 97.8°F | Resp 20 | Wt 185.7 lb

## 2015-12-25 DIAGNOSIS — I2510 Atherosclerotic heart disease of native coronary artery without angina pectoris: Secondary | ICD-10-CM | POA: Diagnosis not present

## 2015-12-25 DIAGNOSIS — I10 Essential (primary) hypertension: Secondary | ICD-10-CM

## 2015-12-25 DIAGNOSIS — J449 Chronic obstructive pulmonary disease, unspecified: Secondary | ICD-10-CM

## 2015-12-25 DIAGNOSIS — R601 Generalized edema: Secondary | ICD-10-CM

## 2015-12-25 DIAGNOSIS — I248 Other forms of acute ischemic heart disease: Secondary | ICD-10-CM | POA: Diagnosis not present

## 2015-12-25 DIAGNOSIS — I739 Peripheral vascular disease, unspecified: Secondary | ICD-10-CM

## 2015-12-25 DIAGNOSIS — C7931 Secondary malignant neoplasm of brain: Secondary | ICD-10-CM | POA: Diagnosis not present

## 2015-12-25 DIAGNOSIS — R569 Unspecified convulsions: Secondary | ICD-10-CM

## 2015-12-25 DIAGNOSIS — R609 Edema, unspecified: Secondary | ICD-10-CM | POA: Insufficient documentation

## 2015-12-25 DIAGNOSIS — J9601 Acute respiratory failure with hypoxia: Secondary | ICD-10-CM | POA: Diagnosis not present

## 2015-12-25 DIAGNOSIS — C3490 Malignant neoplasm of unspecified part of unspecified bronchus or lung: Secondary | ICD-10-CM

## 2015-12-25 DIAGNOSIS — Z510 Encounter for antineoplastic radiation therapy: Secondary | ICD-10-CM | POA: Diagnosis not present

## 2015-12-25 DIAGNOSIS — C7949 Secondary malignant neoplasm of other parts of nervous system: Principal | ICD-10-CM

## 2015-12-25 MED ORDER — GADOBENATE DIMEGLUMINE 529 MG/ML IV SOLN
17.0000 mL | Freq: Once | INTRAVENOUS | Status: AC | PRN
  Administered 2015-12-25: 17 mL via INTRAVENOUS

## 2015-12-25 MED ORDER — SODIUM CHLORIDE 0.9% FLUSH
10.0000 mL | Freq: Once | INTRAVENOUS | Status: AC
  Administered 2015-12-25: 10 mL via INTRAVENOUS

## 2015-12-25 MED ORDER — FUROSEMIDE 20 MG PO TABS: 40.0000 mg | ORAL_TABLET | Freq: Every day | ORAL | Status: DC

## 2015-12-25 NOTE — Patient Instructions (Signed)
Today we updated your med list in our EPIC system...     INCREASE your LASIX (Furosemide) '20mg'$  tabs to 2 tabs each AM...  Decrease the AMLODIPINE '5mg'$  tabs to 1 tab each AM...  Take the new CLONIDINE 0.'1mg'$  tabs- one tab twice daily as a regular med...  Continue the NEBULIZER  4 times daily...  Continue the [place] twice daily...   REMEMBER> NO SALT...  Call for any questions...  Let's plan a follow up visit 1 wk from tomorroq Fri 6/23 at [facility]..Marland Kitchen

## 2015-12-25 NOTE — Progress Notes (Signed)
Does patient have an allergy to IV contrast dye?: No.   Has patient ever received premedication for IV contrast dye?: No.   Does patient take metformin?: No.  If patient does take metformin when was the last dose: No, not Diabetic Date of lab work: 12/24/15 BUN:20.4 CR: 0.8  IV site: Right power port,   Accessed using sterile technique, with 20g   1 inch huber  Needle , flushed with 10 ml normal saline  Flush, excellent blood return, op site placed over right chest site, patient tolerated well Patient was asked name and dob as identification before accessing power port, patient is using our portable  o2 tank,  Patient to go for MRI after ct simulation n they will deaccess his port per [place] D[place],  11:48 AM  11:46 AM

## 2015-12-25 NOTE — Progress Notes (Addendum)
Subjective:    Patient ID: [name], male    DOB: 05-Jan-1941, 75 y.o.   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 PCP= [facility] & Dr. Christella Noa [facility] at [facility]);  Oncology= DrMohamed;  XRT= DrMoody;  Cards= DrVaranasi;  Neurology= DrPenumali; NS= DrCram;  VascSurg= DrDickson    Baseline PFT 11/2011 showed FVC=3.17 (72%), FEV1=1.90 (63%), %1sec=60, mid-flows reduced at 32% predicted; 3% improvement in FEV1 after bronchodil; air trapping on LVs and DLCO was wnl...   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   3/14> he had RULobectomy by Dr. 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  He saw DrMohamed in consult & was quoted a 5-10% 26yrsurvival w/ 4 cycles of adjuvant chemotherapy => completed 12/2012...  He remained disease free for 1.5 yrs, but continued smoking...   F/u CT Chest 07/2014 showed new pulm nodules and extensive adenopathy- mediastinal, hilar, axillary...  Subseq PET scan 1/16 showed pos for widespread lymphangitic dis in both lungs, bilat hilar/ mediastinal/ right axillary, right supraclavic nodes, and right adrenal gland...  MRI Br 1/16 showed small enhancing lesion in right parietal lobe...  DrMohamed referred to Dr. VPrescott Gumfor bronch w/ EUS bx of mediastinal nodes=> pos EBUS and scalene node bx w/ metastatic squamous cell cancer.  3/16> DrM  indicated to pt that his disease is incurable & further treatment palliative => they proceeded w/ further chemotherapy, 4 cycles completed 5/16...  He saw DrMoody for XRT 3/16 due to HAs & they gave 20Gy radiation in 1 fraction but HAs continued...  5/16> repeat MRI Brain showed resolution of small right parietal lesion , but new 318mleft frontal lesion & acute punctate small vessel infarct in the left centrum semiovale => one more dose of 20Gy radiation given 12/04/14...  F/u CT Chest/ Abd/ Pelvis 12/06/14 showed a mixed response to therapy w/ innumerable pulm nodules- some same or smaller, but nodes same or larger, liver ok but right adrenal met larger, & right kidney lesion is suspicious for met; vascular & coronary calcif, extensive spondylosis in lumbar spine...  DrMohamed offered the pt Opdivo immunotherapy & 1st dose given 12/18/14=>  Immunotherapy with Nivolumab 3 MG/KG every 2 weeks. Status post 4 cycles.  stereotactic radiosurgery to multiple brain metastasis performed on 03/12/2015. Completed his radiation treatments on 06/20/15  new left inferior frontal tumor radiation on 07/04/15, to a dose of 20 gray in 1 fraction, to be completed Friday 1/6  Systemic chemotherapy with docetaxel 75 MG/M2 and Cyramza 10 MG/KG every 3 weeks with Neulasta support. Status post 11 cycles (starting from cycle #9 pt was treated w/ Cyramza only due to Docetaxel intolerance).   ~  December 18, 2014:  Add-on appt w/ SN for increased SOB & cough> pt c/o several month hx of increased SOB, cough, thick beige sput & intermittent wheezing, no hemoptysis; now on Opdivo for lung cancer  palliative rx- I reviewed his hx in detail & went over his recent CT Chest/ Abd Pelvis results (their understanding was minimal & he does not have a grasp of the prognosis- eg wife was surprised about the atherosclerosis & wanted to know what could be done about this)...    COPD, quit smoking 09/2014> on O2 at 2-3L/min, NEBS w/ Albut prn; he is  very congested w/ bilat rhonchi & wheezing; we discussed Rx w/ DUONEB Qid, MUCINEX Qid + fluids, and Depo80+PRED taper...    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    HBP & ASHD> followed by [facility], his notes reviewed, on ECASA81, Metop25/d, Amlod10; BP= 150/80 and he knows that he can take an extra Metop25 daily if needed for BP...    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    GERD> on Protionix40    RLS> on Mirapex 3m Qhs...    Anxiety/ insomnia> on Klonopin0.5Bid, Ativan0.5 prn, Ambien10 prn...    Anemia> Labs by DrMohammed 12/05/14 showed Hg=7.6 & he was Tx 2u w/ Hg improved to 10.9 by 12/11/14...  We reviewed prob list, meds, xrays and labs>   CXR 12/05/14 showed norm heart size, diffuse interstitial coarsening & nodularity w/ scarring, s/p right lung surg, etc...  LABS 6/16: reviewed in Epic:  Chems- ok, BS=119, Alb=3.4;  CBC- Hg=10.9 after 2u PCs 12/05/14 for Hg=7.6 PLAN>>  We discussed optimizing his bronchodil, anti-inflamm, & mucolytic rx w/ DUONEB QID regularly, PRED taper (see AVS), and MUCINEX 6036mid +fluids; he will also take the Kl[place]Bid regularly and extra Ativan 0.9m87mrn;  He will f/u w/ me in 3-4 wks...    ~  January 17, 2015:  27mo54mo w/ SN>  RogeKyronorts improved- he's been using the NEBULIZER w/ Duoneb 2-3 x daily, taking MucinexBid, Klonopin0.5Bid, & he finished the Pred taper (it helped); he has mild cough, sm amt beige sputum (less thick), no CP;  Using O2 2-3L/min which he adjusts up & down as needed;  He saw DrMohamed 2d ago & he walked on RA w/ desat to 85% he says;  Pt & wife tell me that DrMohamed insisted that he NOT take any more steroids [facility]  EXAM reveals Afeb, VSS, O2sat=94% on 2L/min;  HEENT- neg, mallampati1;  Chest- decr  BS w/ few bibasilar wheezes and rhonchi;  Heart- RR gr1/6 SEM w/o r/g;  Abd- soft, neg;  Ext- w/o c/c/e... We reviewed prob list, meds, xrays and labs>   Spirometry 01/17/15 showed FVC=2.35 (53%), FEV1=1.11 (33%), %1sec=47, mid-flows are reduced at 13% predicted; c/w severe airflow obstruction & GOLD Stage 3-4 COPD... IMP/PLAN>>  Again asked him to do the Nebulizer w/ Duoneb Qid, Add PULMICORT twisthaler- 2spBid, take MUCINEX600-2Bid, continue cardiac meds, continue KlonopinBid... Continue Oxygen at 2-3L/min...   ~  July 31, 2015:  9mo 7mo& urgent add-on appt w/ SN per DrMohamed> [name] [facility] me [facility] "They didn't do nothing, I didn't qualify"; today he is c/o increased SOB "since last weeks shot" (Neulasta); c/o cough, thick yellow sput, few blood streaks, sore throat, had nose bleed too, but no f/c/s...    I reviewed interval notes from Oncology> last seen 07/29/15 & just finished cycle5 of Taxotere/cyramza w/ Neulasta support, s/p additional brain radiation 07/18/15,  increased resp issues noted, incr edema on Lasix20/d;  CT Angio Chest 07/29/15 showed no emboli, post surg changes on right, no evid of recurrent malignancy, +mucus in trachea & sm effusions => refer to Pulm.  PRIOR THERAPY:  1) Status post right upper lobectomy under the care of Dr. Prescott Gum on 09/14/2012.   2) Adjuvant chemotherapy with cisplatin 75 mg/M2 and docetaxel 75 mg/M2 with Neulasta support every 3 weeks, status post 1 cycle. Starting with cycle #2, patient will receive carboplatin for an AUC initially of 4.5 and paclitaxel at 175 mg per meter squared with Neulasta support given every 3 weeks. Status post a total of 3 cycles.   3) Systemic chemotherapy with carboplatin for AUC of 5 on day 1 and gemcitabine 1000 MG/M2 on days 1 and 8 every 3 weeks. First dose 09/11/2014. Status post 4 cycles. Discontinued secondary to intolerance  4) Immunotherapy with Nivolumab 3  MG/KG every 2 weeks. Status post 4 cycles.  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 Completed his radiation treatments on 06/20/15  6. new left inferior frontal tumor radiation on 07/04/15, to a dose of 20 gray in 1 fraction, to be completed Friday 1/6  CURRENT THERAPY: Systemic chemotherapy with docetaxel 75 MG/M2 and Cyramza 10 MG/KG every 3 weeks with Neulasta support. 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    HBP & ASHD> followed by [facility], his notes reviewed, on ECASA81, Imdur30, Metop25/d, Amlod10, Lisin40, Lasix20; BP= 134/60 and he is stable from the CV standpoint..    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    GERD> on Prilosec40    RLS> on Mirapex 53m Qhs...    Anxiety/ insomnia> on Klonopin0.5Bid, Ambien10 prn...    Anemia> Labs by DrMohammed 07/2015 showed Hg= 10-11 range... EXAM reveals Afeb, VSS, O2sat=97% on 2L/min;  HEENT- neg, mallampati1;  Chest- decr BS w/ few bibasilar wheezes and rhonchi;  Heart- RR gr1/6 SEM w/o r/g;  Abd- soft, neg;  Ext- w/o c/c/e...  SPUTUM C&S => grew NTF only IMP/PLAN>>  We had a long discussion regarding his underlying COPD & the need for regular regimen w/ NEBS (duoneb) Qid, Mucinex 12064mBid, & we are prescribing LEVAQUIN750/d + Depo80+Pred2045md tapering sched (see AVS); in addition we are giving him contingency meds- MMW, nasal saline, etc... He will f/u w/ me in  3wks.   ~  August 27, 2015:  63mo62mo w/ SN>  Last visit we checked Sput C&S=> NTF only & treated him empirically w/ [id number]=> still on 20mg26m he is again using the NEBS only Bid and stopped the Mucinex (says his throat felt better off this med); I reviewed w/ him again the need for an aggressive bronchodilator regimen => O2 at 2-3L/min 24/7, NEBS w/ Duoneb Qid, Mucinex 600Qid w/ fluids; we discussed weaning the Pred down to 10mg/56m this point;  His wife notes that "the chemo has knocked him for a bad loop" (last dose of Taxotere/  Cyramza/ Neulasta was 2/1-08/14/15);  He is also c/o constipation & we discussed Rx w/ Probiotic daily, Miralax 1-2 times daily, Sen[place]ot-S 1-2 Qhs...  EXAM shows Afeb, VSS, O2sat=98% on 1L/min;  HEENT- neg, mallampati1;  Chest- decr BS w/ few bibasilar rhonchi, no wheezing/ rales/ consolidation;  Heart- RR gr1/6 SEM w/o r/g;  Abd- soft, neg;  Ext- tr edema w/o c/c...   LABS 08/2015 per DrMohamed reviewed... IMP/PLAN>>  Naftoli has severe COPD and Stage 4 lung cancer- by all criteria he has done remarkably well on the palliative Chemo/ XRT regimen per DrMohamed;  He is encouraged to use the NEB w/ Duoneb Qid regularly, take the Mucinex-600Qid for the mucus/ congestion, and we will decrease his Pred from 71m/d=> to 152md til ret in ~6wks...  ADDENDUM>>  CT Chest/ Abd/ Pelvis 10/10/15>  Norm heart size, atherosclerosis in Ao, great vessels, and coronaries, thickening calcif in AoV, no adenopathy, s/p RULobectomysmall chr right effusion, no new or suspicious pulm nodules or masses, no airspace dis, mild emphysema & bilat apical pleuroparenchymal thickening/ scarring; Abd is neg x granuloma in spleen, & extensive atherosclerosis w/ ectasia of AbdAo=2.7cm before the bifurcation; prob high-grade stenosis of distal left common femoral art...  ~  December 25, 2015:  49m51moV w/ SN>  There is a lot to get caught up on since his last visit here-- RogAjaveloped left sided weakness  & seizures 12/13/15, he was intubated and ADM by CCM, placed on Keppra & Decadron w/ MRI showing interval progression of dis w/ incr size of R parietal & L frontal lobe lesions w/ incr edema (additional smaller lesions are unchanged & no new lesions noted);  He was stabilized, extubated and disch 12/16/15 on Keppra750-2Bid, and Decadron-49mg449m;  Told to f/u w/ PCP, Oncology, XRT, and Neurology...     He saw DrMohamed 12/17/15>  CURRENT THERAPY: Systemic chemotherapy with docetaxel 75 MG/M2 and Cyramza 10 MG/KG every 3 weeks with Neulasta support. Status post 11 cycles. Starting from cycle #9 and the patient would be treated with only Cyramza as a single agent. Docetaxel was discontinued secondary to intolerance;  ChemoRx was held & [facility]...     [facility]  He felt that surgery to remove the growing lesions in the R parietal and L frontal areas was an option- referred to DrCram & consider preoperative XRT as well for best local control; Decadron dose was weaned...     Pt has called DrVaranasi office w/ elev BP & went back to ER 12/22/15 w/ HBP and increased edema in LEs; he had some SOB & "congestion" given IV Solumedrol & NEB rx w/ improvement; DrVaranasi adjusted his BP regimen w/ incr Amlodipine to 5mgB87m added clonidine0.1mg b59mas needed to elev BP, but his Lasix was kept at just 20mg/d110mpite increased edema (pt still on Decadron)...    Pt saw DrPenumalli 12/24/15> note reviewed- he continued the Keppra [place] not discuss the Decadron medication (on 49mgQid 5mce Hosp [facility] 6/6 & DrMoody cut the dose to 49mgTid o649m/14 but didn't change the directions on Epic med list).    No notes from DrCram in EPIC [place] 01[place]06/16...     Pt presents today w/ this involved Hx and 4+edema/ anasarca, pitting in legs up to his abd wall and some pitting in arms as well and weight is up several lbs in just the last few days;  He notes that his breathing is fair-  SOB w/ min activ & ADLs, he is weak, notes min cough, small amt yellow sput, no hemoptysis denies f/c/s, no CP but feels "tight";  He is on O2 at 2L/min w/ O2sat=100% at rest;  NEBULIZER w/ Duoneb Qid and MUCINEX Bid;  For his BP & heart he is taking ASA81, Imdur30, Metoprolol25Bid, Amlodipine5Bid, Lisinopril40, Catapress0.1 prn, Lasix20/d... EXAM shows Afeb, VSS w/ BP=150/70, Wt=185#, O2sat=1005 on 2L at rest;  HEENT- neg, mallampati1;  Chest- sl decr BS on right, few scat rhonchi, no wheezing or consolidation;  Heart- RR gr1/6 SEM w/o r/g;  Abd- soft, neg;  Ext- 4+edema.  LABS from 12/2015 reviewed in Epic>  Chems- Na=133, K=4.6, BUN=20, Cr=0.8, alb=3.0;  CBC- Hg=12.2, WBC=7.0  CXR 12/22/15>  Post op changes in right lung w/ vol loss, no focal airspace dis or consolidation, NAD... IMP/PLAN>>  Pt has anasarca on large Decadron dose since 6/3 hosp for seizure + not restricting sodium and taking Amlod10/d;  Rec to get on a sodium restricted diet, wean the Amlod to 39m/d, wean the Decadron faster (?who's in charge), and crank up the Lasix as able- currently on 231md=> incr to 4087mm w/ ROV & labs in 1wk...   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 (the 6/15 MRI is the special protocol planning scan that we ordered for treatment and surgical navigation- This scan noted a decrease in the size of this lesion due to the pt being on steroids. The other lesions mentioned are previously treated lesions that need no action at this time. We will  continue to scan every three months to follow-up on the previously treated lesions and keep an eye out for new areas that may present themselves requiring an additional course of SRS).    Past Medical History  Diagnosis Date  . Arthritis   . Wheezing   . Sore throat   . Carotid artery occlusion   . COPD (chronic obstructive pulmonary disease) (HCC)[place]. Lumbar disc disease   . Restless leg syndrome   . Allergic rhinitis   . Hypertension   . Anxiety     anxiety  . Shortness of breath   . Lung mass   . GERD (gastroesophageal reflux disease)   . H/O hiatal hernia   . Pre-operative cardiovascular examination   . Nonspecific abnormal unspecified cardiovascular function study   . Peripheral vascular disease, unspecified (HCC)[place]. Pneumonia 2014    ?   . OnMarland Kitchenhome oxygen therapy     prn  . Constipation   . 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 S/P radiation therapy 12/04/14 srs    lt frontal brain  . Cancer (HCC)[place]6/14    invasive mod diff squamous cell ca  . Metastasis to brain (HCC)[place]2016  . S/P radiation therapy 07/18/15    SRS left frontal brain 20Gy  . Seizures (HCC)[place]3/17    Past Surgical History  Procedure Laterality Date  . Carotid endarterectomy  10/15/2010    left  . Hand surgery      repair of the left long, ring, and small finger after a saw injury  . Video bronchoscopy  07/24/2012    Procedure: VIDEO BRONCHOSCOPY WITH FLUORO;  Surgeon: Kathee Delton, MD;  Location: [facility];  Service: Cardiopulmonary;  Laterality: Bilateral;  . Lobectomy Right 09/14/2012    Procedure: LOBECTOMY;  Surgeon: Ivin Poot, MD;  Location: [place];  Service: Thoracic;  Laterality: Right;  RIGHT UPPER LOBECTOMY  . Video assisted thoracoscopy Right 09/14/2012    Procedure: VIDEO ASSISTED THORACOSCOPY;  Surgeon: Ivin Poot, MD;  Location: [place];  Service: Thoracic;  Laterality: Right;  . Video bronchoscopy with endobronchial ultrasound N/A 08/27/2014    Procedure:  VIDEO BRONCHOSCOPY WITH ENDOBRONCHIAL ULTRASOUND;  Surgeon: Ivin Poot, MD;  Location: [place];  Service: Thoracic;  Laterality: N/A;  . Scalene node biopsy Right 08/27/2014    Procedure: BIOPSY SCALENE NODE;  Surgeon: Ivin Poot, MD;  Location: [place];  Service: Thoracic;  Laterality: Right;  . Sterotactic radiosurgery Right 09/25/14    right parietal 30Gy/1 fx  . Power port a cath Right     Outpatient Encounter Prescriptions as of 12/25/2015  Medication Sig  . amLODipine (NORVASC) 10 MG tablet 1/2 tab by mouth once daily  . aspirin EC 81 MG tablet Take 81 mg by mouth daily. Reported on 12/22/2015  . clonazePAM (KLONOPIN) 0.5 MG tablet Take 0.5 mg by mouth 2 (two) times daily as needed for anxiety. Takes one tablet by mouth two times daily as needed for anxiety.  . cloNIDine (CATAPRES) 0.1 MG tablet Take 0.1 mg by mouth 2 (two) times daily.  . furosemide (LASIX) 20 MG tablet Take 2 tablets (40 mg total) by mouth daily.  Marland Kitchen guaifenesin (HUMIBID E) 400 MG TABS tablet Take 400 mg by mouth 2 (two) times daily.  Marland Kitchen HYDROcodone-acetaminophen (NORCO) 7.5-325 MG tablet Take 1 tablet by mouth every 6 (six) hours as needed for moderate pain.  Marland Kitchen ipratropium-albuterol (DUONEB) 0.5-2.5 (3) MG/3ML SOLN Take 3 mLs by nebulization 4 (four) times daily. (Patient taking differently: Take 3 mLs by nebulization 4 (four) times daily. Pt takes nebulizer 2 to 3 times daily.)  . isosorbide mononitrate (IMDUR) 30 MG 24 hr tablet Take 1 tablet (30 mg total) by mouth daily.  Marland Kitchen levETIRAcetam (KEPPRA) 750 MG tablet Take 2 tablets (1,500 mg total) by mouth 2 (two) times daily.  Marland Kitchen lisinopril (PRINIVIL,ZESTRIL) 40 MG tablet Take 40 mg by mouth at bedtime.   . metoprolol tartrate (LOPRESSOR) 25 MG tablet Take 1 tablet (25 mg total) by mouth 2 (two) times daily. For systolic BP of 321 or greater take 50 mg BID  . omeprazole (PRILOSEC) 40 MG capsule Take 40 mg by mouth daily.  . OXYGEN Inhale 2-3 L into the lungs continuous.    . pentoxifylline (TRENTAL) 400 MG CR tablet Take 1 tablet (400 mg total) by mouth 3 (three) times daily.  . pramipexole (MIRAPEX) 1 MG tablet Take 1 mg by mouth at bedtime.  . prochlorperazine (COMPAZINE) 10 MG tablet Take 10 mg by mouth every 6 (six) hours as needed for nausea or vomiting. Reported on 12/22/2015  . traMADol (ULTRAM) 50 MG tablet Take 1 tablet (50 mg total) by mouth every 12 (twelve) hours as needed. (Patient taking differently: Take 50 mg by mouth daily as needed (pain). )  . triamcinolone cream (KENALOG) 0.1 % Apply 1 application topically 2 (two) times  daily as needed (itching/ dry skin).   Marland Kitchen zolpidem (AMBIEN) 10 MG tablet Take 10 mg by mouth at bedtime as needed for sleep. Reported on 09/25/2015  . [DISCONTINUED] amLODipine (NORVASC) 5 MG tablet Take 1 tablet (5 mg total) by mouth 2 (two) times daily.  . [DISCONTINUED] cloNIDine (CATAPRES) 0.1 MG tablet Take 1 tablet (0.1 mg total) by mouth 2 (two) times daily as needed. When systolic blood pressure above 160 (Patient taking differently: Take 0.1 mg by mouth 2 (two) times daily. )  . [DISCONTINUED] furosemide (LASIX) 20 MG tablet Change to Lasix 20 mg BID x 7 days; then return to 20 mg Q AM. (Patient taking differently: Take 20 mg by mouth daily. )  . [DISCONTINUED] dexamethasone (DECADRON) 4 MG tablet Take 1 tablet (4 mg total) by mouth 4 (four) times daily. (Patient not taking: Reported on 12/25/2015)   Facility-Administered Encounter Medications as of 12/25/2015  Medication  . sodium chloride 0.9 % injection 10 mL    No Known Allergies   Current Medications, Allergies, Past Medical History, Past Surgical History, Family History, and Social History were reviewed in [employer] record.   Review of Systems  Constitutional: Positive for fatigue. Negative for fever and unexpected weight change.  HENT: Positive for congestion. Negative for dental problem, ear pain, nosebleeds, postnasal drip, rhinorrhea,  sinus pressure, sneezing, sore throat and trouble swallowing.   Eyes: Negative for redness and itching.  Respiratory: Positive for cough, chest tightness and shortness of breath.   Cardiovascular: Positive for leg swelling. Negative for palpitations.  Gastrointestinal: Negative for nausea and vomiting.  Genitourinary: Negative for dysuria.  Musculoskeletal: Negative for joint swelling.  Skin: Negative for rash.  Neurological: Positive for dizziness, seizures, weakness and headaches. Negative for syncope.  Hematological: Does not bruise/bleed easily.  Psychiatric/Behavioral: Positive for sleep disturbance and dysphoric mood. The patient is nervous/anxious.       Objective:   Physical Exam  Vital Signs:  Reviewed...  General:  WD, WN, 75 y/o WM chr ill appearing, in NAD; alert & oriented; pleasant & cooperative... HEENT:  [place]/AT; Conjunctiva- pink, Sclera- nonicteric, EOM-wnl, PERRLA, EACs-clear, TMs-wnl; NOSE-clear; THROAT-clear & wnl. Neck:  Supple w/ fair ROM; no JVD; s/p LCAE, faint R bruit; no thyromegaly or nodules palpated; + right supraclav lymphadenopathy. Chest:  Decr BS w/ few rhonchi & wheezes w/o consolidation Heart:  Regular Rhythm; Gr1/6 SEM, w/o rubs or gallops detected. Abdomen:  Soft & nontender- no guarding or rebound; normal bowel sounds; no organomegaly or masses palpated. Ext:  decr ROM; without deformities, mild arthritic changes; no varicose veins, +venous insuffic, 4+edema/ anasarca Neuro:  No focal neuro deficits, gait normal & balance OK. Derm:  No lesions noted; no rash etc. 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 1/19> Rx w/ [id number], Depo80+Pred taper; checked sput C&S=> NTF only... 2/15> He is improved after Levaquin/ Pred taper; now reminded to use the NEBS Qid, Mucinex Qid, & cut the Pred to 90m  Qam til rov... 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 He has mult brain mets and has  received XRT from [facility] who has referred him to NS- DrCram to consider resection w/ additional XRT...  HBP & ASHD> followed by DrVaranasi, his notes reviewed, on ECASA81, Imdur30, Metop25/d, Amlod10, Lisin40, Lasix20; BP= 150/70 and he knows that he can take an extra Metop25 daily if needed for BP... 6/15>  He developed seizures, placed on Keppra & Decadron, and has developed anasarca due to the Decadron25mQid, Amlod10, not restricting sodium=> we decr the Amlod to 560m asked him to elim the salt, DrMoody cut the decadron, and he will increase the Lasixto 4039m + Clonidine 0.1mg58m for his BP...  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  GERD> on Prilosec40  RLS> on Mirapex 1mg 41m...  Anxiety/ insomnia> on Klonopin0.5Bid, Ambien10 prn...  Anemia> Labs by DrMohammed reviewed...   Patient's Medications  New Prescriptions   No medications on file  Previous Medications   AMLODIPINE (NORVASC) 10 MG TABLET    1/2 tab by mouth once daily   ASPIRIN EC 81 MG TABLET    Take 81 mg by mouth daily. Reported on 12/22/2015   CLONAZEPAM (KLONOPIN) 0.5 MG TABLET    Take 0.5 mg by mouth 2 (two) times daily as needed for anxiety. Takes one tablet by mouth two times daily as needed for anxiety.   CLONIDINE (CATAPRES) 0.1 MG TABLET    Take 0.1 mg by mouth 2 (two) times daily.   DEXAMETHASONE (DECADRON) 4 MG TABLET    Take 4 mg by mouth.   GUAIFENESIN (HUMIBID E) 400 MG TABS TABLET    Take 400 mg by mouth 2 (two) times daily.   HYDROCODONE-ACETAMINOPHEN (NORCO) 7.5-325 MG TABLET    Take 1  tablet by mouth every 6 (six) hours as needed for moderate pain.   IPRATROPIUM-ALBUTEROL (DUONEB) 0.5-2.5 (3) MG/3ML SOLN    Take 3 mLs by nebulization 4 (four) times daily.   ISOSORBIDE MONONITRATE (IMDUR) 30 MG 24 HR TABLET    Take 1 tablet (30 mg total) by mouth daily.   LEVETIRACETAM (KEPPRA) 750 MG TABLET    Take 2 tablets (1,500 mg total) by mouth 2 (two) times daily.   LISINOPRIL (PRINIVIL,ZESTRIL) 40 MG TABLET    Take 40 mg by mouth at bedtime.    METOPROLOL TARTRATE (LOPRESSOR) 25 MG TABLET    Take 1 tablet (25 mg total) by mouth 2 (two) times daily. For systolic BP of 160 o740reater take 50 mg BID   OMEPRAZOLE (PRILOSEC) 40 MG CAPSULE    Take 40 mg by mouth daily.   OXYGEN    Inhale 2-3 L into the lungs continuous.   PENTOXIFYLLINE (TRENTAL) 400 MG CR TABLET    Take 1 tablet (400 mg total) by mouth 3 (three) times daily.   PRAMIPEXOLE (MIRAPEX) 1 MG TABLET    Take 1 mg by mouth at bedtime.   PROCHLORPERAZINE (COMPAZINE) 10 MG TABLET    Take 10 mg by mouth every 6 (six) hours as needed for nausea or vomiting. Reported on 12/22/2015   TRAMADOL (ULTRAM) 50 MG TABLET    Take 1 tablet (50 mg total) by mouth every 12 (twelve) hours as needed.   TRIAMCINOLONE CREAM (KENALOG) 0.1 %    Apply 1 application topically 2 (two) times daily as needed (itching/ dry skin).    ZOLPIDEM (AMBIEN) 10 MG TABLET    Take 10 mg by mouth at bedtime as needed for sleep. Reported on 09/25/2015  Modified Medications   Modified Medication  Previous Medication   FUROSEMIDE (LASIX) 20 MG TABLET furosemide (LASIX) 20 MG tablet      Take 2 tablets (40 mg total) by mouth daily.    Change to Lasix 20 mg BID x 7 days; then return to 20 mg Q AM.  Discontinued Medications   AMLODIPINE (NORVASC) 5 MG TABLET    Take 1 tablet (5 mg total) by mouth 2 (two) times daily.   CLONIDINE (CATAPRES) 0.1 MG TABLET    Take 1 tablet (0.1 mg total) by mouth 2 (two) times daily as needed. When systolic blood pressure above 160   DEXAMETHASONE  (DECADRON) 4 MG TABLET    Take 1 tablet (4 mg total) by mouth 4 (four) times daily.

## 2015-12-26 ENCOUNTER — Other Ambulatory Visit: Payer: Self-pay | Admitting: Radiation Therapy

## 2015-12-26 ENCOUNTER — Telehealth: Payer: Self-pay | Admitting: Pulmonary Disease

## 2015-12-26 DIAGNOSIS — C7931 Secondary malignant neoplasm of brain: Secondary | ICD-10-CM

## 2015-12-26 DIAGNOSIS — C7949 Secondary malignant neoplasm of other parts of nervous system: Principal | ICD-10-CM

## 2015-12-26 NOTE — Telephone Encounter (Signed)
Patients blood pressure is 200/90, HR 82.  Pressure in head.   Asked Dr. Lenna Gilford what patient should do.  Dr. Lenna Gilford advised the following: Stay on Amlodipine '5mg'$  QD Change Clonidine .'1mg'$  to 2 tablets twice daily Change Metoprolol '25mg'$  BID to '50mg'$  BID Stay on Lisinopriil '40mg'$  Stay on Imdur 30 Stay on Lasix 40 - NO SALT Ok to continue Neb treatments as prescribed Take Klonipin 1 tablet twice daily through weekend only If blood pressure does not drop below 200/? And/or his head pain gets worse, he needs to go to ER.  Patient and patient's wife notified of Dr. Jeannine Kitten recommendations. Nothing further needed.

## 2015-12-29 ENCOUNTER — Encounter (HOSPITAL_COMMUNITY): Payer: Self-pay

## 2015-12-29 ENCOUNTER — Ambulatory Visit (HOSPITAL_COMMUNITY)
Admission: RE | Admit: 2015-12-29 | Discharge: 2015-12-29 | Disposition: A | Discharge status: Home / Self Care | Payer: Medicare Other | Source: Ambulatory Visit | Attending: Nurse Practitioner | Admitting: Nurse Practitioner

## 2015-12-29 DIAGNOSIS — I2510 Atherosclerotic heart disease of native coronary artery without angina pectoris: Secondary | ICD-10-CM | POA: Diagnosis not present

## 2015-12-29 DIAGNOSIS — I714 Abdominal aortic aneurysm, without rupture: Secondary | ICD-10-CM | POA: Insufficient documentation

## 2015-12-29 DIAGNOSIS — C3490 Malignant neoplasm of unspecified part of unspecified bronchus or lung: Secondary | ICD-10-CM

## 2015-12-29 DIAGNOSIS — J90 Pleural effusion, not elsewhere classified: Secondary | ICD-10-CM | POA: Insufficient documentation

## 2015-12-29 DIAGNOSIS — Z902 Acquired absence of lung [part of]: Secondary | ICD-10-CM | POA: Insufficient documentation

## 2015-12-29 DIAGNOSIS — J984 Other disorders of lung: Secondary | ICD-10-CM | POA: Insufficient documentation

## 2015-12-29 DIAGNOSIS — J432 Centrilobular emphysema: Secondary | ICD-10-CM | POA: Insufficient documentation

## 2015-12-29 MED ORDER — IOPAMIDOL (ISOVUE-300) INJECTION 61%
100.0000 mL | Freq: Once | INTRAVENOUS | Status: AC | PRN
  Administered 2015-12-29: 100 mL via INTRAVENOUS

## 2015-12-30 ENCOUNTER — Observation Stay (HOSPITAL_COMMUNITY)
Admission: EM | Admit: 2015-12-30 | Discharge: 2015-12-30 | Disposition: A | Discharge status: Home / Self Care | Payer: Medicare Other | Attending: Family Medicine | Admitting: Family Medicine

## 2015-12-30 ENCOUNTER — Telehealth: Payer: Self-pay | Admitting: Internal Medicine

## 2015-12-30 ENCOUNTER — Observation Stay (HOSPITAL_BASED_OUTPATIENT_CLINIC_OR_DEPARTMENT_OTHER): Payer: Medicare Other

## 2015-12-30 ENCOUNTER — Emergency Department (HOSPITAL_COMMUNITY): Payer: Medicare Other

## 2015-12-30 ENCOUNTER — Encounter (HOSPITAL_COMMUNITY): Payer: Self-pay | Admitting: Emergency Medicine

## 2015-12-30 ENCOUNTER — Other Ambulatory Visit: Payer: Self-pay

## 2015-12-30 ENCOUNTER — Telehealth: Payer: Self-pay | Admitting: Pulmonary Disease

## 2015-12-30 ENCOUNTER — Other Ambulatory Visit: Payer: Self-pay | Admitting: Medical Oncology

## 2015-12-30 DIAGNOSIS — R0602 Shortness of breath: Secondary | ICD-10-CM | POA: Diagnosis present

## 2015-12-30 DIAGNOSIS — M1990 Unspecified osteoarthritis, unspecified site: Secondary | ICD-10-CM | POA: Diagnosis not present

## 2015-12-30 DIAGNOSIS — I509 Heart failure, unspecified: Secondary | ICD-10-CM

## 2015-12-30 DIAGNOSIS — D72829 Elevated white blood cell count, unspecified: Secondary | ICD-10-CM | POA: Diagnosis present

## 2015-12-30 DIAGNOSIS — J9612 Chronic respiratory failure with hypercapnia: Secondary | ICD-10-CM

## 2015-12-30 DIAGNOSIS — Z923 Personal history of irradiation: Secondary | ICD-10-CM | POA: Diagnosis not present

## 2015-12-30 DIAGNOSIS — J441 Chronic obstructive pulmonary disease with (acute) exacerbation: Secondary | ICD-10-CM | POA: Diagnosis not present

## 2015-12-30 DIAGNOSIS — K219 Gastro-esophageal reflux disease without esophagitis: Secondary | ICD-10-CM | POA: Diagnosis not present

## 2015-12-30 DIAGNOSIS — Z79899 Other long term (current) drug therapy: Secondary | ICD-10-CM | POA: Insufficient documentation

## 2015-12-30 DIAGNOSIS — G4700 Insomnia, unspecified: Secondary | ICD-10-CM | POA: Diagnosis not present

## 2015-12-30 DIAGNOSIS — Z87891 Personal history of nicotine dependence: Secondary | ICD-10-CM | POA: Diagnosis not present

## 2015-12-30 DIAGNOSIS — I5023 Acute on chronic systolic (congestive) heart failure: Secondary | ICD-10-CM | POA: Diagnosis not present

## 2015-12-30 DIAGNOSIS — Z85118 Personal history of other malignant neoplasm of bronchus and lung: Secondary | ICD-10-CM | POA: Insufficient documentation

## 2015-12-30 DIAGNOSIS — E871 Hypo-osmolality and hyponatremia: Secondary | ICD-10-CM | POA: Insufficient documentation

## 2015-12-30 DIAGNOSIS — D649 Anemia, unspecified: Secondary | ICD-10-CM | POA: Diagnosis not present

## 2015-12-30 DIAGNOSIS — I739 Peripheral vascular disease, unspecified: Secondary | ICD-10-CM | POA: Insufficient documentation

## 2015-12-30 DIAGNOSIS — R600 Localized edema: Secondary | ICD-10-CM | POA: Diagnosis not present

## 2015-12-30 DIAGNOSIS — J9611 Chronic respiratory failure with hypoxia: Secondary | ICD-10-CM | POA: Insufficient documentation

## 2015-12-30 DIAGNOSIS — I10 Essential (primary) hypertension: Secondary | ICD-10-CM | POA: Diagnosis not present

## 2015-12-30 DIAGNOSIS — Z9981 Dependence on supplemental oxygen: Secondary | ICD-10-CM | POA: Insufficient documentation

## 2015-12-30 DIAGNOSIS — Z79891 Long term (current) use of opiate analgesic: Secondary | ICD-10-CM | POA: Diagnosis not present

## 2015-12-30 DIAGNOSIS — J189 Pneumonia, unspecified organism: Secondary | ICD-10-CM

## 2015-12-30 DIAGNOSIS — C3490 Malignant neoplasm of unspecified part of unspecified bronchus or lung: Secondary | ICD-10-CM | POA: Diagnosis present

## 2015-12-30 DIAGNOSIS — C7931 Secondary malignant neoplasm of brain: Secondary | ICD-10-CM | POA: Diagnosis not present

## 2015-12-30 DIAGNOSIS — C3491 Malignant neoplasm of unspecified part of right bronchus or lung: Secondary | ICD-10-CM

## 2015-12-30 DIAGNOSIS — Z7982 Long term (current) use of aspirin: Secondary | ICD-10-CM | POA: Insufficient documentation

## 2015-12-30 DIAGNOSIS — C3411 Malignant neoplasm of upper lobe, right bronchus or lung: Secondary | ICD-10-CM | POA: Diagnosis not present

## 2015-12-30 DIAGNOSIS — F419 Anxiety disorder, unspecified: Secondary | ICD-10-CM | POA: Insufficient documentation

## 2015-12-30 DIAGNOSIS — I714 Abdominal aortic aneurysm, without rupture: Secondary | ICD-10-CM | POA: Diagnosis not present

## 2015-12-30 DIAGNOSIS — J45901 Unspecified asthma with (acute) exacerbation: Secondary | ICD-10-CM

## 2015-12-30 LAB — BASIC METABOLIC PANEL
Anion gap: 6 (ref 5–15)
BUN: 20 mg/dL (ref 6–20)
CALCIUM: 7.6 mg/dL — AB (ref 8.9–10.3)
CHLORIDE: 93 mmol/L — AB (ref 101–111)
CO2: 30 mmol/L (ref 22–32)
CREATININE: 0.68 mg/dL (ref 0.61–1.24)
GFR calc non Af Amer: >60 mL/min (ref >60)
GLUCOSE: 115 mg/dL — AB (ref 65–99)
Potassium: 4.9 mmol/L (ref 3.5–5.1)
Sodium: 129 mmol/L — ABNORMAL LOW (ref 135–145)

## 2015-12-30 LAB — CBC WITH DIFFERENTIAL/PLATELET
BASOS PCT: 0 %
Basophils Absolute: 0 10*3/uL (ref 0.0–0.1)
Eosinophils Absolute: 0 10*3/uL (ref 0.0–0.7)
Eosinophils Relative: 0 %
HEMATOCRIT: 37.1 % — AB (ref 39.0–52.0)
Hemoglobin: 12.2 g/dL — ABNORMAL LOW (ref 13.0–17.0)
LYMPHS ABS: 0.7 10*3/uL (ref 0.7–4.0)
Lymphocytes Relative: 4 %
MCH: 31.1 pg (ref 26.0–34.0)
MCHC: 32.9 g/dL (ref 30.0–36.0)
MCV: 94.6 fL (ref 78.0–100.0)
MONO ABS: 1 10*3/uL (ref 0.1–1.0)
MONOS PCT: 6 %
NEUTROS ABS: 15.4 10*3/uL — AB (ref 1.7–7.7)
Neutrophils Relative %: 90 %
Platelets: 145 10*3/uL — ABNORMAL LOW (ref 150–400)
RBC: 3.92 MIL/uL — ABNORMAL LOW (ref 4.22–5.81)
RDW: 14.6 % (ref 11.5–15.5)
WBC: 17.1 10*3/uL — ABNORMAL HIGH (ref 4.0–10.5)

## 2015-12-30 LAB — ECHOCARDIOGRAM COMPLETE
CHL CUP MV DEC (S): 349
E decel time: 349 msec
E/e' ratio: 8.11
FS: 25 % — AB (ref 28–44)
Height: 69.5 in
IV/PV OW: 1.35
LA diam end sys: 30 mm
LA diam index: 1.46 cm/m2
LA vol A4C: 39.3 ml
LASIZE: 30 mm
LV PW d: 10.9 mm — AB (ref 0.6–1.1)
LV TDI E'MEDIAL: 8.38
LVEEAVG: 8.11
LVEEMED: 8.11
LVELAT: 11.9 cm/s
MVPG: 4 mmHg
MVPKAVEL: 108 m/s
MVPKEVEL: 96.5 m/s
TDI e' lateral: 11.9
Weight: 3123.2 oz

## 2015-12-30 LAB — BLOOD GAS, ARTERIAL
ACID-BASE EXCESS: 5.1 mmol/L — AB (ref 0.0–2.0)
Bicarbonate: 31.8 mEq/L — ABNORMAL HIGH (ref 20.0–24.0)
DRAWN BY: 422461
O2 Content: 2 L/min
O2 SAT: 96.6 %
PATIENT TEMPERATURE: 97.6
TCO2: 29.2 mmol/L (ref 0–100)
pCO2 arterial: 58.6 mmHg (ref 35.0–45.0)
pH, Arterial: 7.351 (ref 7.350–7.450)
pO2, Arterial: 87.2 mmHg (ref 80.0–100.0)

## 2015-12-30 LAB — I-STAT TROPONIN, ED: Troponin i, poc: 0.02 ng/mL (ref 0.00–0.08)

## 2015-12-30 LAB — STREP PNEUMONIAE URINARY ANTIGEN: Strep Pneumo Urinary Antigen: NEGATIVE

## 2015-12-30 LAB — EXPECTORATED SPUTUM ASSESSMENT W REFEX TO RESP CULTURE

## 2015-12-30 LAB — EXPECTORATED SPUTUM ASSESSMENT W GRAM STAIN, RFLX TO RESP C

## 2015-12-30 LAB — BRAIN NATRIURETIC PEPTIDE: B Natriuretic Peptide: 265.3 pg/mL — ABNORMAL HIGH (ref 0.0–100.0)

## 2015-12-30 LAB — HIV ANTIBODY (ROUTINE TESTING W REFLEX): HIV SCREEN 4TH GENERATION: NONREACTIVE

## 2015-12-30 MED ORDER — TRAMADOL HCL 50 MG PO TABS: 50.0000 mg | ORAL_TABLET | Freq: Every day | ORAL | Status: DC | PRN

## 2015-12-30 MED ORDER — AZITHROMYCIN 250 MG PO TABS
250.0000 mg | ORAL_TABLET | Freq: Every day | ORAL | Status: DC
Start: 2015-12-30 — End: 2016-01-16

## 2015-12-30 MED ORDER — PIPERACILLIN-TAZOBACTAM 3.375 G IVPB
3.3750 g | Freq: Three times a day (TID) | INTRAVENOUS | Status: DC
  Filled 2015-12-30 (×2): qty 50

## 2015-12-30 MED ORDER — PANTOPRAZOLE SODIUM 40 MG PO TBEC
40.0000 mg | DELAYED_RELEASE_TABLET | Freq: Every day | ORAL | Status: DC
  Administered 2015-12-30: 40 mg via ORAL
  Filled 2015-12-30: qty 1

## 2015-12-30 MED ORDER — ACETAMINOPHEN 650 MG RE SUPP: 650.0000 mg | Freq: Four times a day (QID) | RECTAL | Status: DC | PRN

## 2015-12-30 MED ORDER — DEXAMETHASONE 4 MG PO TABS
4.0000 mg | ORAL_TABLET | Freq: Four times a day (QID) | ORAL | Status: DC
  Filled 2015-12-30 (×4): qty 1

## 2015-12-30 MED ORDER — TRIAMCINOLONE ACETONIDE 0.1 % EX CREA
1.0000 "application " | TOPICAL_CREAM | Freq: Two times a day (BID) | CUTANEOUS | Status: DC | PRN
  Filled 2015-12-30: qty 15

## 2015-12-30 MED ORDER — LISINOPRIL 20 MG PO TABS
40.0000 mg | ORAL_TABLET | Freq: Every day | ORAL | Status: DC
  Filled 2015-12-30: qty 1

## 2015-12-30 MED ORDER — SODIUM CHLORIDE 0.9 % IV SOLN
1.0000 g | Freq: Once | INTRAVENOUS | Status: DC
  Filled 2015-12-30: qty 10

## 2015-12-30 MED ORDER — GUAIFENESIN 200 MG PO TABS
400.0000 mg | ORAL_TABLET | Freq: Two times a day (BID) | ORAL | Status: DC
  Administered 2015-12-30: 400 mg via ORAL
  Filled 2015-12-30 (×2): qty 2

## 2015-12-30 MED ORDER — CLONIDINE HCL 0.1 MG PO TABS
0.1000 mg | ORAL_TABLET | Freq: Two times a day (BID) | ORAL | Status: DC
  Administered 2015-12-30: 0.1 mg via ORAL
  Filled 2015-12-30: qty 1

## 2015-12-30 MED ORDER — SALINE SPRAY 0.65 % NA SOLN
1.0000 | NASAL | Status: DC | PRN
Start: 2015-12-30 — End: 2015-12-30
  Filled 2015-12-30: qty 44

## 2015-12-30 MED ORDER — VANCOMYCIN HCL IN DEXTROSE 1-5 GM/200ML-% IV SOLN
1000.0000 mg | INTRAVENOUS | Status: AC
  Administered 2015-12-30: 1000 mg via INTRAVENOUS
  Filled 2015-12-30: qty 200

## 2015-12-30 MED ORDER — LEVETIRACETAM 500 MG PO TABS
1500.0000 mg | ORAL_TABLET | Freq: Two times a day (BID) | ORAL | Status: DC
  Administered 2015-12-30: 1500 mg via ORAL
  Filled 2015-12-30 (×2): qty 2
  Filled 2015-12-30: qty 3

## 2015-12-30 MED ORDER — SODIUM CHLORIDE 0.9% FLUSH: 3.0000 mL | INTRAVENOUS | Status: DC | PRN

## 2015-12-30 MED ORDER — ONDANSETRON HCL 4 MG/2ML IJ SOLN: 4.0000 mg | Freq: Four times a day (QID) | INTRAMUSCULAR | Status: DC | PRN

## 2015-12-30 MED ORDER — IPRATROPIUM BROMIDE 0.02 % IN SOLN: 0.5000 mg | RESPIRATORY_TRACT | Status: DC

## 2015-12-30 MED ORDER — HEPARIN SOD (PORK) LOCK FLUSH 100 UNIT/ML IV SOLN
500.0000 [IU] | INTRAVENOUS | Status: AC | PRN
  Administered 2015-12-30: 500 [IU]

## 2015-12-30 MED ORDER — ARFORMOTEROL TARTRATE 15 MCG/2ML IN NEBU
15.0000 ug | INHALATION_SOLUTION | Freq: Two times a day (BID) | RESPIRATORY_TRACT | Status: DC
  Administered 2015-12-30: 15 ug via RESPIRATORY_TRACT
  Filled 2015-12-30 (×4): qty 2

## 2015-12-30 MED ORDER — ENOXAPARIN SODIUM 40 MG/0.4ML ~~LOC~~ SOLN
40.0000 mg | SUBCUTANEOUS | Status: DC
  Administered 2015-12-30: 40 mg via SUBCUTANEOUS
  Filled 2015-12-30 (×2): qty 0.4

## 2015-12-30 MED ORDER — ALBUTEROL SULFATE (2.5 MG/3ML) 0.083% IN NEBU: 2.5000 mg | INHALATION_SOLUTION | RESPIRATORY_TRACT | Status: DC | PRN

## 2015-12-30 MED ORDER — VANCOMYCIN HCL 10 G IV SOLR
1250.0000 mg | Freq: Two times a day (BID) | INTRAVENOUS | Status: DC
  Filled 2015-12-30: qty 1250

## 2015-12-30 MED ORDER — ACETAMINOPHEN 325 MG PO TABS: 650.0000 mg | ORAL_TABLET | ORAL | Status: DC | PRN

## 2015-12-30 MED ORDER — AZITHROMYCIN 250 MG PO TABS: 250.0000 mg | ORAL_TABLET | Freq: Every day | ORAL | Status: DC

## 2015-12-30 MED ORDER — FUROSEMIDE 10 MG/ML IJ SOLN
40.0000 mg | Freq: Once | INTRAMUSCULAR | Status: AC
  Administered 2015-12-30: 40 mg via INTRAVENOUS
  Filled 2015-12-30: qty 4

## 2015-12-30 MED ORDER — METHYLPREDNISOLONE SODIUM SUCC 125 MG IJ SOLR
60.0000 mg | Freq: Three times a day (TID) | INTRAMUSCULAR | Status: DC
  Administered 2015-12-30: 60 mg via INTRAVENOUS
  Filled 2015-12-30 (×2): qty 2

## 2015-12-30 MED ORDER — IPRATROPIUM BROMIDE 0.02 % IN SOLN
0.5000 mg | Freq: Once | RESPIRATORY_TRACT | Status: AC
  Administered 2015-12-30: 0.5 mg via RESPIRATORY_TRACT
  Filled 2015-12-30: qty 2.5

## 2015-12-30 MED ORDER — METOPROLOL TARTRATE 25 MG PO TABS
25.0000 mg | ORAL_TABLET | Freq: Two times a day (BID) | ORAL | Status: DC
  Administered 2015-12-30: 25 mg via ORAL
  Filled 2015-12-30: qty 1

## 2015-12-30 MED ORDER — ZOLPIDEM TARTRATE 10 MG PO TABS: 10.0000 mg | ORAL_TABLET | Freq: Every evening | ORAL | Status: DC | PRN

## 2015-12-30 MED ORDER — ACETAMINOPHEN 325 MG PO TABS: 650.0000 mg | ORAL_TABLET | Freq: Four times a day (QID) | ORAL | Status: DC | PRN

## 2015-12-30 MED ORDER — BUDESONIDE 0.5 MG/2ML IN SUSP
0.5000 mg | Freq: Two times a day (BID) | RESPIRATORY_TRACT | Status: DC
  Administered 2015-12-30: 0.5 mg via RESPIRATORY_TRACT
  Filled 2015-12-30: qty 2

## 2015-12-30 MED ORDER — ALBUTEROL SULFATE (2.5 MG/3ML) 0.083% IN NEBU: 2.5000 mg | INHALATION_SOLUTION | RESPIRATORY_TRACT | Status: DC

## 2015-12-30 MED ORDER — IPRATROPIUM-ALBUTEROL 0.5-2.5 (3) MG/3ML IN SOLN
3.0000 mL | RESPIRATORY_TRACT | Status: AC
  Administered 2015-12-30 (×2): 3 mL via RESPIRATORY_TRACT
  Filled 2015-12-30: qty 6

## 2015-12-30 MED ORDER — PRAMIPEXOLE DIHYDROCHLORIDE 1 MG PO TABS
1.0000 mg | ORAL_TABLET | Freq: Every day | ORAL | Status: DC
  Filled 2015-12-30: qty 1

## 2015-12-30 MED ORDER — FUROSEMIDE 10 MG/ML IJ SOLN
40.0000 mg | INTRAMUSCULAR | Status: AC
  Administered 2015-12-30: 40 mg via INTRAVENOUS
  Filled 2015-12-30: qty 4

## 2015-12-30 MED ORDER — ALBUTEROL (5 MG/ML) CONTINUOUS INHALATION SOLN
10.0000 mg/h | INHALATION_SOLUTION | Freq: Once | RESPIRATORY_TRACT | Status: AC
  Administered 2015-12-30: 10 mg/h via RESPIRATORY_TRACT
  Filled 2015-12-30: qty 20

## 2015-12-30 MED ORDER — ASPIRIN EC 81 MG PO TBEC
81.0000 mg | DELAYED_RELEASE_TABLET | Freq: Every day | ORAL | Status: DC
  Administered 2015-12-30: 81 mg via ORAL
  Filled 2015-12-30 (×2): qty 1

## 2015-12-30 MED ORDER — SODIUM CHLORIDE 0.9% FLUSH
10.0000 mL | INTRAVENOUS | Status: DC | PRN
  Administered 2015-12-30: 10 mL
  Filled 2015-12-30: qty 40

## 2015-12-30 MED ORDER — AMLODIPINE BESYLATE 5 MG PO TABS
5.0000 mg | ORAL_TABLET | Freq: Every day | ORAL | Status: DC
  Administered 2015-12-30: 5 mg via ORAL
  Filled 2015-12-30 (×2): qty 1

## 2015-12-30 MED ORDER — IPRATROPIUM-ALBUTEROL 0.5-2.5 (3) MG/3ML IN SOLN
3.0000 mL | RESPIRATORY_TRACT | Status: DC
  Administered 2015-12-30 (×3): 3 mL via RESPIRATORY_TRACT
  Filled 2015-12-30 (×3): qty 3

## 2015-12-30 MED ORDER — ONDANSETRON HCL 4 MG PO TABS: 4.0000 mg | ORAL_TABLET | Freq: Four times a day (QID) | ORAL | Status: DC | PRN

## 2015-12-30 MED ORDER — VANCOMYCIN HCL IN DEXTROSE 1-5 GM/200ML-% IV SOLN
1000.0000 mg | Freq: Three times a day (TID) | INTRAVENOUS | Status: DC
  Filled 2015-12-30: qty 200

## 2015-12-30 MED ORDER — SODIUM CHLORIDE 0.9 % IV SOLN: 250.0000 mL | INTRAVENOUS | Status: DC | PRN

## 2015-12-30 MED ORDER — PIPERACILLIN-TAZOBACTAM 3.375 G IVPB 30 MIN
3.3750 g | INTRAVENOUS | Status: AC
  Administered 2015-12-30: 3.375 g via INTRAVENOUS
  Filled 2015-12-30: qty 50

## 2015-12-30 MED ORDER — AZITHROMYCIN 250 MG PO TABS
500.0000 mg | ORAL_TABLET | Freq: Every day | ORAL | Status: AC
  Administered 2015-12-30: 500 mg via ORAL
  Filled 2015-12-30 (×2): qty 2

## 2015-12-30 MED ORDER — DEXAMETHASONE 4 MG PO TABS
4.0000 mg | ORAL_TABLET | Freq: Three times a day (TID) | ORAL | Status: DC
  Administered 2015-12-30: 4 mg via ORAL
  Filled 2015-12-30 (×3): qty 1

## 2015-12-30 MED ORDER — CLONAZEPAM 0.5 MG PO TABS: 0.5000 mg | ORAL_TABLET | Freq: Two times a day (BID) | ORAL | Status: DC | PRN

## 2015-12-30 MED ORDER — SODIUM CHLORIDE 0.9% FLUSH
3.0000 mL | Freq: Two times a day (BID) | INTRAVENOUS | Status: DC
  Administered 2015-12-30: 3 mL via INTRAVENOUS

## 2015-12-30 MED ORDER — ISOSORBIDE MONONITRATE ER 30 MG PO TB24
30.0000 mg | ORAL_TABLET | Freq: Every day | ORAL | Status: DC
  Administered 2015-12-30: 30 mg via ORAL
  Filled 2015-12-30 (×2): qty 1

## 2015-12-30 MED ORDER — IPRATROPIUM-ALBUTEROL 0.5-2.5 (3) MG/3ML IN SOLN
3.0000 mL | Freq: Once | RESPIRATORY_TRACT | Status: AC
  Administered 2015-12-30: 3 mL via RESPIRATORY_TRACT
  Filled 2015-12-30: qty 3

## 2015-12-30 NOTE — ED Notes (Signed)
Respiratory paged for administration of continuous neb treatment ordered for pt.

## 2015-12-30 NOTE — Progress Notes (Signed)
PROGRESS NOTE  [name] JQZ:[id number] DOB: 28-Aug-1940 DOA: 12/30/2015 PCP: Beatris Si  Brief Narrative: 75 year old man with history of non-small cell lung cancer, chronic hypoxic respiratory failure, COPD presented with shortness of breath, admitted for COPD exacerbation.   Assessment/Plan: 1. COPD exacerbation appears resolved at this point. CT chest yesterday negative for infection. No evidence of pneumonia. 2. Chronic hypercapnic respiratory failure, chronic hypoxic respiratory failure on 2 L 3. 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 4. Chronic hyponatremia, likely related to lung cancer, at baseline   Appears to be a baseline this point, feels well. Requesting discharge home.  Home today on oral steroids, Zithromax for COPD exacerbation with productive sputum.   Continue Decadron (wife reports patient is taking 3 times a day)  Has surgery scheduled next week for resection of brain tumor with radiation plan the day before.  Family Communication: wife at bedside Disposition Plan: home  Murray Hodgkins, MD  [facility] Direct contact: [phone] --[place]  --[email or web address]; password [email or web address]  7PM-7AM contact night coverage as above 12/30/2015, 4:32 PM    Consultants:  Oncology   Procedures:  Echo Study Conclusions  - Procedure narrative: Transthoracic echocardiography. Image  quality was adequate. The study was technically difficult due to  variations in respirations and due to patient coughing. - Left ventricle: The cavity size was normal. There was moderate  focal basal hypertrophy of the septum and mild hypertrophy of the  posterior wall. Systolic function was normal. The estimated  ejection fraction was in the range of 55% to 60%. Wall motion was  normal; there were no regional wall motion abnormalities. Left  ventricular diastolic function parameters  were normal. - Aortic valve: Transvalvular velocity was within the normal range.  There was no stenosis. There was no regurgitation. - Mitral valve: There was no regurgitation. - Right ventricle: The cavity size was normal. Wall thickness was  normal. Systolic function was normal. - Tricuspid valve: There was trivial regurgitation.  Antimicrobials:  Zithromax 6/20 >> 6/24  HPI/Subjective: Feels good, no SOB, no pain, no complaints. LE edema about the same. Wife at bedside, hopes for discharge.  Objective: Filed Vitals:   12/30/15 6256 12/30/15 0729 12/30/15 0948 12/30/15 1507  BP: 158/66  155/62 134/53  Pulse: 95  101 78  Temp: 97.3 F (36.3 C)  97.9 F (36.6 C) 97.8 F (36.6 C)  TempSrc: Oral  Oral Oral  Resp: '20  20 18  '$ Height: 5' 9.5" (1.765 m)     Weight: 88.542 kg (195 lb 3.2 oz)     SpO2: 99% 95% 100% 100%    Intake/Output Summary (Last 24 hours) at 12/30/15 1632 Last data filed at 12/30/15 1408  Gross per 24 hour  Intake    250 ml  Output      0 ml  Net    250 ml     Filed Weights   12/30/15 0042 12/30/15 0605  Weight: 81.647 kg (180 lb) 88.542 kg (195 lb 3.2 oz)    Exam: Constitutional:  . Appears calm and comfortable Respiratory:  . Few wheezes, good air movement, no rhonchi or rales . Respiratory effort normal. No retractions or accessory muscle use Cardiovascular:  . tachycardic, no m/r/g . No LE extremity edema   . Telemetry ST Psychiatric:  . judgement and insight appear normal . Mental status o Mood, affect appropriate  I have personally reviewed following labs and imaging studies:  Sodium 129  PCO2 58, pH 7.35  BNP slightly elevated to 65  Troponin negative  WBC 17.1   Chest x-ray no acute disease    Scheduled Meds: . amLODipine  5 mg Oral Daily  . arformoterol  15 mcg Nebulization BID  . aspirin EC  81 mg Oral Daily  . [START ON 12/31/2015] azithromycin  250 mg Oral Daily  . budesonide (PULMICORT) nebulizer solution   0.5 mg Nebulization BID  . calcium gluconate  1 g Intravenous Once  . cloNIDine  0.1 mg Oral BID  . dexamethasone  4 mg Oral Q8H  . enoxaparin (LOVENOX) injection  40 mg Subcutaneous Q24H  . guaiFENesin  400 mg Oral BID  . ipratropium-albuterol  3 mL Nebulization Q4H  . isosorbide mononitrate  30 mg Oral Daily  . levETIRAcetam  1,500 mg Oral BID  . lisinopril  40 mg Oral QHS  . metoprolol tartrate  25 mg Oral BID  . pantoprazole  40 mg Oral Daily  . pramipexole  1 mg Oral QHS  . sodium chloride flush  3 mL Intravenous Q12H   Continuous Infusions:   Principal Problem:   COPD exacerbation (HCC) Active Problems:   Metastatic lung cancer (metastasis from lung to other site) [facility])   Malignant neoplasm of lung (HCC)   Benign essential HTN   Acute exacerbation of CHF (congestive heart failure) (HCC)   Leukocytosis

## 2015-12-30 NOTE — Care Management Obs Status (Signed)
[place] NOTIFICATION   Patient Details  Name: [name] MRN: [id number] Date of Birth: [birth year]   Medicare Observation Status Notification Given:  Yes    Dessa Phi, RN 12/30/2015, 12:05 PM

## 2015-12-30 NOTE — ED Notes (Signed)
Pt reports shortness of breath that started 4 hr prior to arrival. EMS established IV access and administered '125mg'$  Solu-Medrol and 2 Duoneb treatments during transport. EMS advised improvement in symptoms after medications.

## 2015-12-30 NOTE — ED Notes (Signed)
PT laying in bed eyes closed equal chest rise and fall no acute distress. Nebulizer treatment still infusing at this time.

## 2015-12-30 NOTE — Discharge Summary (Signed)
Physician Discharge Summary  [name] FAO:[id number] DOB: August 11, 1940 DOA: 12/30/2015  PCP: Beatris Si  Admit date: 12/30/2015 Discharge date: 12/30/2015  Recommendations for Outpatient Follow-up:  1. Resolution of COPD exacerbation (include homehealth, outpatient follow-up instructions, specific recommendations for PCP to follow-up on, etc.)  Follow-up Information    Follow up with HEPLER,MARK, PA-C.   Specialty:  Physician Assistant   Contact information:   [address] [place] [address] [phone]      Discharge Diagnoses:  1. COPD exacerbation 2. Chronic hypercapnic respiratory failure 3. Chronic hypoxic respiratory failure 4. 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 Chronic hyponatremia secondary to lung cancer 6. Chronic lower extremity edema  Discharge Condition: improved Disposition: home  Diet recommendation: regular  Filed Weights   12/30/15 0042 12/30/15 0605  Weight: 81.647 kg (180 lb) 88.542 kg (195 lb 3.2 oz)    History of present illness:  75 year old man with history of non-small cell lung cancer, chronic hypoxic respiratory failure, COPD presented with shortness of breath, admitted for COPD exacerbation.   Hospital Course:  Patient was treated with steroids, bronchodilators, oxygen, antibiotics with rapid improvement. At this point he is at his baseline from a pulmonary standpoint and requesting discharge home. No evidence of pneumonia but does have productive cough. Will treat for COPD exacerbation, bronchitis.  1. COPD exacerbation appears resolved at this point. CT chest yesterday negative for infection. No evidence of pneumonia. 2. Chronic hypercapnic respiratory failure, chronic hypoxic respiratory failure on 2 L 3. 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 4. Chronic hyponatremia, likely related to lung cancer, at  baseline 5. Chronic bilateral lower extremity edema, recently Lasix was increased by his pulmonologist. Etiology unclear. Continue current dosing. Echocardiogram reassuring with normal LVEF, left ventricular diastolic function, systolic RV function   Appears to be a baseline this point, feels well. Requesting discharge home.  Home today on oral steroids, Zithromax for COPD exacerbation with productive sputum.   Continue Decadron (wife reports patient is taking 3 times a day)  Has surgery scheduled next week for resection of brain tumor with radiation plan the day before.  Consultants:  Oncology  Procedures:  Echo Study Conclusions  - Procedure narrative: Transthoracic echocardiography. Image  quality was adequate. The study was technically difficult due to  variations in respirations and due to patient coughing. - Left ventricle: The cavity size was normal. There was moderate  focal basal hypertrophy of the septum and mild hypertrophy of the  posterior wall. Systolic function was normal. The estimated  ejection fraction was in the range of 55% to 60%. Wall motion was  normal; there were no regional wall motion abnormalities. Left  ventricular diastolic function parameters were normal. - Aortic valve: Transvalvular velocity was within the normal range.  There was no stenosis. There was no regurgitation. - Mitral valve: There was no regurgitation. - Right ventricle: The cavity size was normal. Wall thickness was  normal. Systolic function was normal. - Tricuspid valve: There was trivial regurgitation.  Antimicrobials:  Zithromax 6/20 >> 6/24  Discharge Instructions  Discharge Instructions    Activity as tolerated - No restrictions    Complete by:  As directed      Diet - low sodium heart healthy    Complete by:  As directed      Discharge instructions    Complete by:  As directed   Call your physician or seek immediate medical attention  for shortness of  breath, wheezing, difficulty breathing or worsening of condition.          Current Discharge Medication List    START taking these medications   Details  azithromycin (ZITHROMAX) 250 MG tablet Take 1 tablet (250 mg total) by mouth daily. Qty: 6 each, Refills: 0      CONTINUE these medications which have NOT CHANGED   Details  amLODipine (NORVASC) 5 MG tablet Take 5 mg by mouth daily.    aspirin EC 81 MG tablet Take 81 mg by mouth daily. Reported on 12/22/2015    clonazePAM (KLONOPIN) 0.5 MG tablet Take 0.5 mg by mouth 2 (two) times daily as needed for anxiety. Takes one tablet by mouth two times daily as needed for anxiety.    cloNIDine (CATAPRES) 0.1 MG tablet Take 0.1 mg by mouth 2 (two) times daily.    dexamethasone (DECADRON) 4 MG tablet Take 4 mg by mouth 4 (four) times daily.     furosemide (LASIX) 20 MG tablet Take 2 tablets (40 mg total) by mouth daily. Qty: 60 tablet, Refills: 5    guaifenesin (HUMIBID E) 400 MG TABS tablet Take 400 mg by mouth 2 (two) times daily.    HYDROcodone-acetaminophen (NORCO) 7.5-325 MG tablet Take 1 tablet by mouth every 6 (six) hours as needed for moderate pain. Qty: 45 tablet, Refills: 0   Associated Diagnoses: Personal history of malignant neoplasm of bronchus and lung; Brain metastasis ([place]); Mixed simple and mucopurulent chronic bronchitis (HCC)    ipratropium-albuterol (DUONEB) 0.5-2.5 (3) MG/3ML SOLN Take 3 mLs by nebulization 4 (four) times daily. Qty: 360 mL, Refills: 2    isosorbide mononitrate (IMDUR) 30 MG 24 hr tablet Take 1 tablet (30 mg total) by mouth daily. Qty: 30 tablet, Refills: 4    levETIRAcetam (KEPPRA) 750 MG tablet Take 2 tablets (1,500 mg total) by mouth 2 (two) times daily. Qty: 360 tablet, Refills: 4    lisinopril (PRINIVIL,ZESTRIL) 40 MG tablet Take 40 mg by mouth at bedtime.     metoprolol tartrate (LOPRESSOR) 25 MG tablet Take 1 tablet (25 mg total) by mouth 2 (two) times daily. For systolic BP of 161 or  greater take 50 mg BID Qty: 240 tablet, Refills: 1    omeprazole (PRILOSEC) 40 MG capsule Take 40 mg by mouth daily.    pramipexole (MIRAPEX) 1 MG tablet Take 1 mg by mouth at bedtime.    sodium chloride (OCEAN) 0.65 % SOLN nasal spray Place 1 spray into both nostrils as needed for congestion.    traMADol (ULTRAM) 50 MG tablet Take 1 tablet (50 mg total) by mouth every 12 (twelve) hours as needed. Qty: 30 tablet, Refills: 0    triamcinolone cream (KENALOG) 0.1 % Apply 1 application topically 2 (two) times daily as needed (itching/ dry skin).     zolpidem (AMBIEN) 10 MG tablet Take 10 mg by mouth at bedtime as needed for sleep. Reported on 09/25/2015    OXYGEN Inhale 2-3 L into the lungs continuous.       No Known Allergies  The results of significant diagnostics from this hospitalization (including imaging, microbiology, ancillary and laboratory) are listed below for reference.    Significant Diagnostic Studies: Dg Chest 2 View  12/22/2015  CLINICAL DATA:  Shortness of breath, wheezing, chest pressure radiating to the left arm. History of hypertension. History of lung cancer metastatic to brain post right upper lobectomy. EXAM: CHEST  2 VIEW COMPARISON:  12/13/2015 FINDINGS: Power port type central  venous catheter with tip over the mid SVC region. No pneumothorax. Postoperative changes in the right lung with surgical clips and volume loss. No focal airspace disease or consolidation in the lungs. Blunting of the right costophrenic angle likely due to postoperative pleural thickening. Old right thoracotomy. Surgical clips in the base the neck. No pneumothorax. Mediastinal contours appear intact. Calcification of the aorta. IMPRESSION: Postoperative changes with associated volume loss in the right lung. No evidence of active pulmonary disease. Electronically Signed   By: Lucienne Capers M.D.   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 EXAM: CT CHEST, ABDOMEN, AND PELVIS WITH CONTRAST TECHNIQUE: Multidetector CT imaging of the chest, abdomen and pelvis was performed following the standard protocol during bolus administration of intravenous contrast. CONTRAST:  163m ISOVUE-300 IOPAMIDOL (ISOVUE-300) INJECTION 61% COMPARISON:  10/10/2015 CT chest, abdomen and pelvis. FINDINGS: CT CHEST Mediastinum/Nodes: Normal heart size. No significant pericardial fluid/thickening. Right internal jugular MediPort terminates in the lower third of the superior vena cava. Left main, left anterior descending, left circumflex and right coronary atherosclerosis. Atherosclerotic nonaneurysmal thoracic aorta. Normal caliber pulmonary arteries. No central pulmonary emboli. No discrete thyroid nodules. Unremarkable esophagus. No pathologically enlarged axillary, mediastinal or hilar lymph nodes. Lungs/Pleura: No pneumothorax. Stable small simple layering right pleural effusion. New trace layering left pleural effusion. Layering secretions are present in the lower trachea and bilateral mainstem bronchi. Status post right upper lobectomy. Mild centrilobular emphysema and diffuse bronchial wall thickening. No acute consolidative airspace disease or significant pulmonary nodules. No appreciable change in patchy subpleural reticulation throughout both lungs. Musculoskeletal: No aggressive appearing focal osseous lesions. Stable incompletely healed nondisplaced posterior right seventh rib fracture. Stable healed deformity in the lateral right eighth rib. CT ABDOMEN AND PELVIS Hepatobiliary: Normal liver with no liver mass. Normal gallbladder with no radiopaque cholelithiasis. No biliary ductal dilatation. Pancreas: Normal, with no mass or duct dilation. Spleen: Normal size. No mass. Adrenals/Urinary Tract: Normal adrenals. No hydronephrosis. No renal masses. Normal bladder. Stomach/Bowel: Grossly  normal stomach. Normal caliber small bowel with no small bowel wall thickening. Stable moderate duodenal diverticulum in the superior third portion of the duodenum. Normal appendix. Normal large bowel with no diverticulosis, large bowel wall thickening or pericolonic fat stranding. Vascular/Lymphatic: Atherosclerotic abdominal aorta with 2.7 cm infrarenal abdominal aortic aneurysm, unchanged. Patent portal, splenic, hepatic and renal veins. No pathologically enlarged lymph nodes in the abdomen or pelvis. Reproductive: Top-normal size prostate. Other: No pneumoperitoneum.  Trace free fluid in the pelvis. Musculoskeletal: No aggressive appearing focal osseous lesions. Marked degenerative changes in the lumbar spine. Mild anasarca. IMPRESSION: 1. No evidence of metastatic disease in the chest, abdomen or pelvis . 2. No evidence of local tumor recurrence in the right lung status post right upper lobectomy . 3. Stable small right and new trace left pleural effusions. 4. Additional findings include left main and 3 vessel coronary atherosclerosis, mild centrilobular emphysema with diffuse bronchial wall thickening suggesting of COPD, and stable 2.7 cm infrarenal abdominal aortic aneurysm. Electronically Signed   By: JIlona SorrelM.D.   On: 12/29/2015 08:25   Dg Chest Portable 1 View  12/30/2015  CLINICAL DATA:  75year old male with shortness of breath and wheezing EXAM: PORTABLE CHEST 1 VIEW COMPARISON:  CT dated 12/29/2015 FINDINGS: Single portable view of the chest demonstrates emphysematous changes of the lungs. There is a small right pleural effusion as  seen on the prior CT. There is associated partial compressive atelectasis of the right lung base. Underlying infiltrate is not excluded. No focal consolidation or pneumothorax. Right pectoral Port-A-Cath with tip over central SVC. 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 The cardiac silhouette is within  normal limits with no acute osseous pathology. IMPRESSION: Small right pleural effusion. No focal consolidation or pneumothorax. Electronically Signed   By: Anner Crete M.D.   On: 12/30/2015 02:12   Labs: Basic Metabolic Panel:  Recent Labs Lab 12/24/15 0904 12/30/15 0131  NA 133* 129*  K 4.6 4.9  CL  --  93*  CO2 32* 30  GLUCOSE 109 115*  BUN 20.4 20  CREATININE 0.8 0.68  CALCIUM 8.0* 7.6*   Liver Function Tests:  Recent Labs Lab 12/24/15 0904  AST 12  ALT 11  ALKPHOS 43  BILITOT 0.30  PROT 5.4*  ALBUMIN 3.0*   CBC:  Recent Labs Lab 12/24/15 0903 12/30/15 0131  WBC 7.0 17.1*  NEUTROABS 5.5 15.4*  HGB 12.2* 12.2*  HCT 37.8* 37.1*  MCV 96.4 94.6  PLT 158 145*     Recent Labs  06/20/15 0920 12/30/15 0131  BNP 156.6* 265.3*     Principal Problem:   COPD exacerbation (HCC) Active Problems:   Metastatic lung cancer (metastasis from lung to other site) (HCC)   Malignant neoplasm of lung (HCC)   Benign essential HTN   Acute exacerbation of CHF (congestive heart failure) (HCC)   Leukocytosis   Time coordinating discharge: 35 minutes  Signed:  Murray Hodgkins, MD [facility] 12/30/2015, 4:43 PM

## 2015-12-30 NOTE — ED Notes (Signed)
Pt laying in bed eyes closed equal chest rise and fall no acute distress. Awaiting admission orders and room assignment.

## 2015-12-30 NOTE — Progress Notes (Signed)
DIAGNOSIS: Metastatic non-small cell lung cancer, squamous cell carcinoma initially diagnosed as Stage IIA (T2b., N0, M0) non-small cell lung cancer, squamous cell carcinoma diagnosed in December of 2013   PRIOR THERAPY:  1) Status post right upper lobectomy under the care of Dr. Prescott Gum on 09/14/2012.  2) Adjuvant chemotherapy with cisplatin 75 mg/M2 and docetaxel 75 mg/M2 with Neulasta support every 3 weeks, status post 1 cycle. Starting with cycle #2, patient will receive carboplatin for an AUC initially of 4.5 and paclitaxel at 175 mg per meter squared with Neulasta support given every 3 weeks. Status post a total of 3 cycles.  3) Systemic chemotherapy with carboplatin for AUC of 5 on day 1 and gemcitabine 1000 MG/M2 on days 1 and 8 every 3 weeks. First dose 09/11/2014. Status post 4 cycles. Discontinued secondary to intolerance 4) Immunotherapy with Nivolumab 3 MG/KG every 2 weeks. Status post 4 cycles. 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  CURRENT THERAPY: Systemic chemotherapy with docetaxel 75 MG/M2 and Cyramza 10 MG/KG every 3 weeks with Neulasta support. Status post 11 cycles. Starting from cycle #9 and the patient would be treated with only Cyramza as a single agent. Docetaxel was discontinued secondary to intolerance.  CHEMOTHERAPY INTENT: Palliative. CURRENT # OF CHEMOTHERAPY CYCLES: 11 CURRENT ANTIEMETICS: Zofran, dexamethasone and Compazine  CURRENT SMOKING STATUS: Quit smoking recently.  ORAL CHEMOTHERAPY AND CONSENT: None  CURRENT BISPHOSPHONATES USE: None  PAIN MANAGEMENT: 3/10 controlled by Norco when necessary  NARCOTICS INDUCED CONSTIPATION: Milk of magnesia on as-needed basis.  LIVING WILL AND CODE STATUS: No CODE BLUE.  Subjective: The patient is seen and examined today. He is feeling a little bit better compared to yesterday. He was admitted with shortness of breath and most likely secondary to COPD exacerbation.  He denied having any fever or chills. He has no nausea or vomiting. He had repeat CT scan of the chest, abdomen and pelvis performed yesterday and he was supposed to come to the office tomorrow for evaluation and discussion of his scan results. He is also undergoing irradiation under the care of Dr. Lisbeth Renshaw.  Objective: Vital signs in last 24 hours: Temp:  [97.3 F (36.3 C)-97.9 F (36.6 C)] 97.9 F (36.6 C) (06/20 0948) Pulse Rate:  [63-101] 101 (06/20 0948) Resp:  [8-20] 20 (06/20 0948) BP: (112-162)/(51-77) 155/62 mmHg (06/20 0948) SpO2:  [95 %-100 %] 100 % (06/20 0948) Weight:  [180 lb (81.647 kg)-195 lb 3.2 oz (88.542 kg)] 195 lb 3.2 oz (88.542 kg) (06/20 0605)  Intake/Output from previous day:   Intake/Output this shift: Total I/O In: 240 [P.O.:240] Out: -   General appearance: alert, cooperative, fatigued and no distress Resp: wheezes bilaterally Cardio: regular rate and rhythm, S1, S2 normal, no murmur, click, rub or gallop GI: soft, non-tender; bowel sounds normal; no masses,  no organomegaly Extremities: extremities normal, atraumatic, no cyanosis or edema  Lab Results:   Recent Labs  12/30/15 0131  WBC 17.1*  HGB 12.2*  HCT 37.1*  PLT 145*   BMET  Recent Labs  12/30/15 0131  NA 129*  K 4.9  CL 93*  CO2 30  GLUCOSE 115*  BUN 20  CREATININE 0.68  CALCIUM 7.6*    Studies/Results: Ct Chest W Contrast  12/29/2015  CLINICAL DATA:  Metastatic squamous cell right upper lobe lung carcinoma presents for restaging on treatment. EXAM: CT CHEST, ABDOMEN, AND PELVIS WITH CONTRAST TECHNIQUE: Multidetector CT imaging of the chest, abdomen and pelvis was performed following  the standard protocol during bolus administration of intravenous contrast. CONTRAST:  157m ISOVUE-300 IOPAMIDOL (ISOVUE-300) INJECTION 61% COMPARISON:  10/10/2015 CT chest, abdomen and pelvis. FINDINGS: CT CHEST Mediastinum/Nodes: Normal heart size. No significant pericardial fluid/thickening. Right  internal jugular MediPort terminates in the lower third of the superior vena cava. Left main, left anterior descending, left circumflex and right coronary atherosclerosis. Atherosclerotic nonaneurysmal thoracic aorta. Normal caliber pulmonary arteries. No central pulmonary emboli. No discrete thyroid nodules. Unremarkable esophagus. No pathologically enlarged axillary, mediastinal or hilar lymph nodes. Lungs/Pleura: No pneumothorax. Stable small simple layering right pleural effusion. New trace layering left pleural effusion. Layering secretions are present in the lower trachea and bilateral mainstem bronchi. Status post right upper lobectomy. Mild centrilobular emphysema and diffuse bronchial wall thickening. No acute consolidative airspace disease or significant pulmonary nodules. No appreciable change in patchy subpleural reticulation throughout both lungs. Musculoskeletal: No aggressive appearing focal osseous lesions. Stable incompletely healed nondisplaced posterior right seventh rib fracture. Stable healed deformity in the lateral right eighth rib. CT ABDOMEN AND PELVIS Hepatobiliary: Normal liver with no liver mass. Normal gallbladder with no radiopaque cholelithiasis. No biliary ductal dilatation. Pancreas: Normal, with no mass or duct dilation. Spleen: Normal size. No mass. Adrenals/Urinary Tract: Normal adrenals. No hydronephrosis. No renal masses. Normal bladder. Stomach/Bowel: Grossly normal stomach. Normal caliber small bowel with no small bowel wall thickening. Stable moderate duodenal diverticulum in the superior third portion of the duodenum. Normal appendix. Normal large bowel with no diverticulosis, large bowel wall thickening or pericolonic fat stranding. Vascular/Lymphatic: Atherosclerotic abdominal aorta with 2.7 cm infrarenal abdominal aortic aneurysm, unchanged. Patent portal, splenic, hepatic and renal veins. No pathologically enlarged lymph nodes in the abdomen or pelvis. Reproductive:  Top-normal size prostate. Other: No pneumoperitoneum.  Trace free fluid in the pelvis. Musculoskeletal: No aggressive appearing focal osseous lesions. Marked degenerative changes in the lumbar spine. Mild anasarca. IMPRESSION: 1. No evidence of metastatic disease in the chest, abdomen or pelvis . 2. No evidence of local tumor recurrence in the right lung status post right upper lobectomy . 3. Stable small right and new trace left pleural effusions. 4. Additional findings include left main and 3 vessel coronary atherosclerosis, mild centrilobular emphysema with diffuse bronchial wall thickening suggesting of COPD, and stable 2.7 cm infrarenal abdominal aortic aneurysm. Electronically Signed   By: JIlona SorrelM.D.   On: 12/29/2015 08:25   Ct Abdomen Pelvis W Contrast  12/29/2015  CLINICAL DATA:  Metastatic squamous cell right upper lobe lung carcinoma presents for restaging on treatment. EXAM: CT CHEST, ABDOMEN, AND PELVIS WITH CONTRAST TECHNIQUE: Multidetector CT imaging of the chest, abdomen and pelvis was performed following the standard protocol during bolus administration of intravenous contrast. CONTRAST:  1060mISOVUE-300 IOPAMIDOL (ISOVUE-300) INJECTION 61% COMPARISON:  10/10/2015 CT chest, abdomen and pelvis. FINDINGS: CT CHEST Mediastinum/Nodes: Normal heart size. No significant pericardial fluid/thickening. Right internal jugular MediPort terminates in the lower third of the superior vena cava. Left main, left anterior descending, left circumflex and right coronary atherosclerosis. Atherosclerotic nonaneurysmal thoracic aorta. Normal caliber pulmonary arteries. No central pulmonary emboli. No discrete thyroid nodules. Unremarkable esophagus. No pathologically enlarged axillary, mediastinal or hilar lymph nodes. Lungs/Pleura: No pneumothorax. Stable small simple layering right pleural effusion. New trace layering left pleural effusion. Layering secretions are present in the lower trachea and bilateral  mainstem bronchi. Status post right upper lobectomy. Mild centrilobular emphysema and diffuse bronchial wall thickening. No acute consolidative airspace disease or significant pulmonary nodules. No appreciable change in patchy subpleural reticulation throughout  both lungs. Musculoskeletal: No aggressive appearing focal osseous lesions. Stable incompletely healed nondisplaced posterior right seventh rib fracture. Stable healed deformity in the lateral right eighth rib. CT ABDOMEN AND PELVIS Hepatobiliary: Normal liver with no liver mass. Normal gallbladder with no radiopaque cholelithiasis. No biliary ductal dilatation. Pancreas: Normal, with no mass or duct dilation. Spleen: Normal size. No mass. Adrenals/Urinary Tract: Normal adrenals. No hydronephrosis. No renal masses. Normal bladder. Stomach/Bowel: Grossly normal stomach. Normal caliber small bowel with no small bowel wall thickening. Stable moderate duodenal diverticulum in the superior third portion of the duodenum. Normal appendix. Normal large bowel with no diverticulosis, large bowel wall thickening or pericolonic fat stranding. Vascular/Lymphatic: Atherosclerotic abdominal aorta with 2.7 cm infrarenal abdominal aortic aneurysm, unchanged. Patent portal, splenic, hepatic and renal veins. No pathologically enlarged lymph nodes in the abdomen or pelvis. Reproductive: Top-normal size prostate. Other: No pneumoperitoneum.  Trace free fluid in the pelvis. Musculoskeletal: No aggressive appearing focal osseous lesions. Marked degenerative changes in the lumbar spine. Mild anasarca. IMPRESSION: 1. No evidence of metastatic disease in the chest, abdomen or pelvis . 2. No evidence of local tumor recurrence in the right lung status post right upper lobectomy . 3. Stable small right and new trace left pleural effusions. 4. Additional findings include left main and 3 vessel coronary atherosclerosis, mild centrilobular emphysema with diffuse bronchial wall thickening  suggesting of COPD, and stable 2.7 cm infrarenal abdominal aortic aneurysm. Electronically Signed   By: Ilona Sorrel M.D.   On: 12/29/2015 08:25   Dg Chest Portable 1 View  12/30/2015  CLINICAL DATA:  75 year old male with shortness of breath and wheezing EXAM: PORTABLE CHEST 1 VIEW COMPARISON:  CT dated 12/29/2015 FINDINGS: Single portable view of the chest demonstrates emphysematous changes of the lungs. There is a small right pleural effusion as seen on the prior CT. There is associated partial compressive atelectasis of the right lung base. Underlying infiltrate is not excluded. No focal consolidation or pneumothorax. Right pectoral Port-A-Cath with tip over central SVC. 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 The cardiac silhouette is within normal limits with no acute osseous pathology. IMPRESSION: Small right pleural effusion. No focal consolidation or pneumothorax. Electronically Signed   By: Anner Crete M.D.   On: 12/30/2015 02:12    Medications: I have reviewed the patient's current medications.  CODE STATUS: Full code  Assessment/Plan: 1) metastatic non-small cell lung cancer, squamous cell carcinoma status post several chemotherapy regimens. The recent CT scan of the chest, abdomen and pelvis performed yesterday showed no evidence of metastatic disease in the chest, abdomen and pelvis. This is a very impressive response to his previous treatment. I discussed the scan results with the patient today. I left him a copy of the scan reports. I will consider the patient for observation at this point.  2) multiple metastatic brain lesion: Currently undergoing radiotherapy under the care of Dr. Lisbeth Renshaw. The patient is also considered for surgical resection of the dominant lesion. 3) COPD exacerbation: Continue current treatment with the steroids and nebulizer. 4) questionable congestive heart failure: He was undergoing 2-D echo at  the time of the visit. Thank you so much for taking good care of Mr. Teems. I will continue to follow up with the patient with you and assist in his management on as-needed basis.     Adonai Helzer K. 12/30/2015

## 2015-12-30 NOTE — Telephone Encounter (Signed)
Pt wife came to confirm tomorrows appt....per orders MD cx lab and visit for 6.21 and r/s for 2weeks out...gave pt new sched

## 2015-12-30 NOTE — Care Management Note (Signed)
Case Management Note  Patient Details  Name: [name] MRN: [id number] Date of Birth: Feb 03, 1941  Subjective/Objective: 75 y/o m admitted w/COPD exac. From home. Has home 02-[facility]-has travel tank. Has pcp, pulmonary dr, good support from spouse. Patient asked if wanted HHC-currently declines HHC, or any other services.                   Action/Plan:d/c plan home.   Expected Discharge Date:                  Expected Discharge Plan:  Home/Self Care  In-House Referral:     Discharge planning Services  CM Consult  Post Acute Care Choice:  Durable Medical Equipment [facility] dme-home 02-travel tank) Choice offered to:     DME Arranged:    DME Agency:     HH Arranged:    HH Agency:     Status of Service:  In process, will continue to follow  If discussed at Long Length of Stay Meetings, dates discussed:    Additional Comments:  Dessa Phi, RN 12/30/2015, 12:03 PM

## 2015-12-30 NOTE — Telephone Encounter (Signed)
Spoke with pt's wife and she states that pt was taken via EMS to [facility] early this morning. Pt was found to have HAP and is being treated. She wanted to know if any LBP doc's would be seeing pt while he was hospitalized. I do not see any noted in chart that anyone has seen him yet. She mentioned he may be d/c'd today or tomorrow. Advised that I would send to SN as FYI.  SN - FYI. Thanks!

## 2015-12-30 NOTE — ED Notes (Signed)
Bed: ZV72 Expected date:  Expected time:  Means of arrival:  Comments: Difficulty breathing

## 2015-12-30 NOTE — ED Provider Notes (Signed)
TIME SEEN: 1:24 am  CHIEF COMPLAINT: SOB  HPI: [name] is a 75 y.o male with a PMHx of COPD, non-small cell lung cancer status post right upper lobectomy who is been off chemotherapy for the past 3 months who admits to the ED complaining of shortness of breath with associated wheezing that started 4 hours ago. Pt's wife states that he is breathing much better than he was before she brought him in. Was given 2 duo nebs and 125 mg of IV Solu-Medrol with EMS. Pt states he normally wears 2L O2 via [place] at home. Pt also reports an associated cough which is normal due to his COPD. states this is unchanged. Pt also reports swelling in his legs that he states is slightly worse than normal. Pt denies any chest pain or fever. Wife reports he is always slightly tachypneic with pursed lip breathing.   ROS: See HPI Constitutional: no fever  Eyes: no drainage  ENT: no runny nose   Cardiovascular:  no chest pain  Resp: SOB  GI: no vomiting GU: no dysuria Integumentary: no rash  Allergy: no hives  Musculoskeletal: no leg swelling  Neurological: no slurred speech ROS otherwise negative  PAST MEDICAL HISTORY/PAST SURGICAL HISTORY:  Past Medical History  Diagnosis Date  . Arthritis   . Wheezing   . Sore throat   . Carotid artery occlusion   . COPD (chronic obstructive pulmonary disease) ([place])   . Lumbar disc disease   . Restless leg syndrome   . Allergic rhinitis   . Hypertension   . Anxiety     anxiety  . Shortness of breath   . Lung mass   . GERD (gastroesophageal reflux disease)   . H/O hiatal hernia   . Pre-operative cardiovascular examination   . Nonspecific abnormal unspecified cardiovascular function study   . Peripheral vascular disease, unspecified ([place])   . Pneumonia 2014    ?   Marland Kitchen On home oxygen therapy     prn  . Constipation   . 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 S/P radiation therapy 12/04/14 srs    lt frontal brain  . Cancer ([place]) 09/14/12    invasive  mod diff squamous cell ca  . Metastasis to brain ([place]) 02/2015  . S/P radiation therapy 07/18/15    SRS left frontal brain 20Gy  . Seizures ([place]) 12/13/15    MEDICATIONS:  Prior to Admission medications   Medication Sig Start Date End Date Taking? Authorizing Provider  amLODipine (NORVASC) 5 MG tablet Take 5 mg by mouth daily.   Yes Historical Provider, MD  aspirin EC 81 MG tablet Take 81 mg by mouth daily. Reported on 12/22/2015   Yes Historical Provider, MD  clonazePAM (KLONOPIN) 0.5 MG tablet Take 0.5 mg by mouth 2 (two) times daily as needed for anxiety. Takes one tablet by mouth two times daily as needed for anxiety.   Yes Historical Provider, MD  cloNIDine (CATAPRES) 0.1 MG tablet Take 0.1 mg by mouth 2 (two) times daily.   Yes Historical Provider, MD  dexamethasone (DECADRON) 4 MG tablet Take 4 mg by mouth 4 (four) times daily.  12/16/15  Yes Historical Provider, MD  furosemide (LASIX) 20 MG tablet Take 2 tablets (40 mg total) by mouth daily. 12/25/15  Yes Noralee Space, MD  guaifenesin (HUMIBID E) 400 MG TABS tablet Take 400 mg by mouth 2 (two) times daily.   Yes Historical Provider, MD  HYDROcodone-acetaminophen (NORCO) 7.5-325 MG  tablet Take 1 tablet by mouth every 6 (six) hours as needed for moderate pain. 09/25/15  Yes Heather F Boelter, NP  ipratropium-albuterol (DUONEB) 0.5-2.5 (3) MG/3ML SOLN Take 3 mLs by nebulization 4 (four) times daily. Patient taking differently: Take 3 mLs by nebulization 4 (four) times daily. Pt takes nebulizer 2 to 3 times daily. 12/18/14  Yes Noralee Space, MD  isosorbide mononitrate (IMDUR) 30 MG 24 hr tablet Take 1 tablet (30 mg total) by mouth daily. 08/12/15  Yes Jettie Booze, MD  levETIRAcetam (KEPPRA) 750 MG tablet Take 2 tablets (1,500 mg total) by mouth 2 (two) times daily. 12/24/15  Yes Penni Bombard, MD  lisinopril (PRINIVIL,ZESTRIL) 40 MG tablet Take 40 mg by mouth at bedtime.  04/23/15  Yes Historical Provider, MD  metoprolol tartrate  (LOPRESSOR) 25 MG tablet Take 1 tablet (25 mg total) by mouth 2 (two) times daily. For systolic BP of 299 or greater take 50 mg BID 12/18/15  Yes Almyra Deforest, PA  omeprazole (PRILOSEC) 40 MG capsule Take 40 mg by mouth daily.   Yes Historical Provider, MD  pramipexole (MIRAPEX) 1 MG tablet Take 1 mg by mouth at bedtime.   Yes Historical Provider, MD  sodium chloride (OCEAN) 0.65 % SOLN nasal spray Place 1 spray into both nostrils as needed for congestion.   Yes Historical Provider, MD  traMADol (ULTRAM) 50 MG tablet Take 1 tablet (50 mg total) by mouth every 12 (twelve) hours as needed. Patient taking differently: Take 50 mg by mouth daily as needed (pain).  01/29/15  Yes Laurie Panda, NP  triamcinolone cream (KENALOG) 0.1 % Apply 1 application topically 2 (two) times daily as needed (itching/ dry skin).  10/30/15  Yes Historical Provider, MD  zolpidem (AMBIEN) 10 MG tablet Take 10 mg by mouth at bedtime as needed for sleep. Reported on 09/25/2015   Yes Historical Provider, MD  OXYGEN Inhale 2-3 L into the lungs continuous.    Historical Provider, MD    ALLERGIES:  No Known Allergies  SOCIAL HISTORY:  Social History  Substance Use Topics  . Smoking status: Former Smoker -- 1.00 packs/day for 55 years    Types: Cigarettes    Quit date: 08/16/2014  . Smokeless tobacco: Former Systems developer    Quit date: 04/19/1952  . Alcohol Use: Yes     Comment: occianional- not since I ve sick.    FAMILY HISTORY: Family History  Problem Relation Age of Onset  . Heart disease Father     MI  . Heart attack Father   . Alcohol abuse Mother   . Stroke Brother   . Heart disease Brother   . Depression Sister   . Heart attack Brother     EXAM: BP 162/71 mmHg  Pulse 91  Temp(Src) 97.6 F (36.4 C) (Oral)  Resp 17  Ht '5\' 9"'$  (1.753 m)  Wt 180 lb (81.647 kg)  BMI 26.57 kg/m2  SpO2 100% CONSTITUTIONAL: Alert and oriented and responds appropriately to questions. Elderly and chronically ill appearing. HEAD:  Normocephalic EYES: Conjunctivae clear, PERRL ENT: normal nose; no rhinorrhea; moist mucous membranes NECK: Supple, no meningismus, no LAD  CARD: RRR; S1 and S2 appreciated; no murmurs, no clicks, no rubs, no gallops RESP: Mildly tachypneic, no hypoxia on normal 2L of O2, diminished at bases bilaterally diffuse expiratory wheezing no rhonchi or rales ABD/GI: Normal bowel sounds; non-distended; soft, non-tender, no rebound, no guarding, no peritoneal signs BACK:  The back appears normal and is non-tender  to palpation, there is no CVA tenderness EXT: Normal ROM in all joints; non-tender to palpation; no edema; normal capillary refill; no cyanosis, no calf tenderness or swelling. +1 pitting edema to bilateral lower extremities mid calf  SKIN: Normal color for age and race; warm; no rash NEURO: Moves all extremities equally, sensation to light touch intact diffusely, cranial nerves II through XII intact PSYCH: [name] mood and manner are appropriate. Grooming and personal hygiene are appropriate.  MEDICAL DECISION MAKING: Patient here with shortness of breath, wheezing that started tonight. Feels similar to his prior COPD exacerbations. No chest pain or chest discomfort. We'll give continuous albuterol, obtain labs, chest x-ray. Patient would like to go home if possible.  ED PROGRESS: 2:45 AM  Pt is very somnolent but arousable to voice. This may be because of how late it is but also concern for possible hypercarbia. We'll obtain ABG. He now has increased wheezing after continuous albuterol but states he is feeling better and wants to go home. Have recommended he stay for several more breathing treatments and have encouraged admission for observation for frequent duo nebs, IV steroids but this time he is refusing.  He does have a leukocytosis but is on prednisone. Chest x-ray shows right-sided atelectasis versus infiltrate with associated right pleural effusion. Suspect that this could be  infectious in nature. Will give antibiotics.   3:40 AM  Pt still wheezing. ABG shows pH is 7.343, PCO2 60.2, bicarbonate 31.8. Appears to be well compensated. I feel he would need admission for his COPD exacerbation and for possible healthcare associated pneumonia. Will give broad-spectrum antibiotics, obtain blood cultures. He now agrees on admission. Will discuss with hospitalist.   4:15 AM  Discussed patient's case with hospitalist, Dr. Tamala Julian.  Recommend admission to observation, telemetry bed.  I will place holding orders per their request. Patient and family (if present) updated with plan. Care transferred to hospitalist service.  I reviewed all nursing notes, vitals, pertinent old records, EKGs, labs, imaging (as available).    Date: 12/30/2015 00:45  Rate: 90  Rhythm: normal sinus rhythm  QRS Axis: normal  Intervals: normal  ST/T Wave abnormalities: normal  Conduction Disutrbances: none  Narrative Interpretation: unremarkable; no significant change compared to prior EKGs      This chart was scribed in my presence and reviewed by me personally.   [place], DO 12/30/15 [phone]

## 2015-12-30 NOTE — Progress Notes (Signed)
Pharmacy Antibiotic Note  [name] is a 75 y.o. male admitted on 12/30/2015 with COPD exacerbation and possible pneumonia.  Patient presented with c/o difficulty breathing and wheezing. PMH includes COPD, NSCLC s/p right upper lobectomy; off chemo x 3 months.  Pharmacy has been consulted for Vancomycin & Zosyn dosing.  Plan:  Zosyn 3.375gm IV q8h (each dose infused over 4 hrs)  Vancomycin 1gm IV q8h  Vancomycin trough goal: 15-20 mcg/ml  F/U culture results & sensitivities  Follow renal function  Check vancomycin trough level when appropriate  Height: '5\' 9"'$  (175.3 cm) Weight: 180 lb (81.647 kg) IBW/kg (Calculated) : 70.7  Temp (24hrs), Avg:97.6 F (36.4 C), Min:97.6 F (36.4 C), Max:97.6 F (36.4 C)   Recent Labs Lab 12/24/15 0903 12/24/15 0904 12/30/15 0131  WBC 7.0  --  17.1*  CREATININE  --  0.8 0.68    Estimated Creatinine Clearance: 83.5 mL/min (by C-G formula based on Cr of 0.68).    No Known Allergies  Antimicrobials this admission: 6/20 Vanc >>   6/20 Zosyn >>    Dose adjustments this admission:    Microbiology results: 6/20 BCx:    Thank you for allowing pharmacy to be a part of this patient's care.  Everette Rank, PharmD 12/30/2015 4:15 AM

## 2015-12-30 NOTE — ED Notes (Signed)
Pt laying in bed with respiratory at bedside starting second breathing treatment. Pt reports improvement in breathing at this time. Crackles noted bilaterally throughout lung fields with expiratory wheezing to upper lobes.

## 2015-12-30 NOTE — Progress Notes (Signed)
  Echocardiogram 2D Echocardiogram has been performed.  [name] 12/30/2015, 1:30 PM

## 2015-12-30 NOTE — Progress Notes (Signed)
Pharmacy Antibiotic Note  [name] is a 75 y.o. male admitted on 12/30/2015 with COPD exacerbation and possible pneumonia.  Patient presented with c/o difficulty breathing and wheezing. PMH includes COPD, NSCLC s/p right upper lobectomy; off chemo x 3 months.  Pharmacy has been consulted for Vancomycin & Zosyn dosing.  Plan: Zosyn 3.375g IV Q8H infused over 4hrs. Vancomycin 1g x1 then 1250 mg IV q12h. Measure Vanc trough at steady state. Follow up renal fxn, culture results, and clinical course.   Height: 5' 9.5" (176.5 cm) Weight: 195 lb 3.2 oz (88.542 kg) IBW/kg (Calculated) : 71.85  Temp (24hrs), Avg:97.6 F (36.4 C), Min:97.3 F (36.3 C), Max:97.9 F (36.6 C)   Recent Labs Lab 12/24/15 0903 12/24/15 0904 12/30/15 0131  WBC 7.0  --  17.1*  CREATININE  --  0.8 0.68    Estimated Creatinine Clearance: 92.7 mL/min (by C-G formula based on Cr of 0.68).    No Known Allergies  Antimicrobials this admission: 6/20 Vanc >>   6/20 Zosyn >>    Dose adjustments this admission: 6/20 Vanc empirically adjusted from 1gm IV q8h to 1250 q12h d/t age  Microbiology results: Previous admission: 6/3 BCx: NGF 6/3 Tracheal aspirate: normal flora 6/3 MRSA PCR: negative 6/3 UCx: NGF  Current admission: 6/20 BCx:  ngtd  Thank you for allowing pharmacy to be a part of this patient's care.  Gretta Arab PharmD, BCPS Pager [phone] 12/30/2015 10:03 AM

## 2015-12-30 NOTE — H&P (Addendum)
History and Physical    [name]:[id number] DOB: [birth year] DOA: 12/30/2015  Referring MD/NP/PA: Dr. Leonides Schanz PCP: [name], PA-C  Patient coming from: Home  Chief Complaint: Shortness of breath  HPI: LADARRIUS Rice is a 75 y.o. male with medical history significant of COPD, chronic respiratory failure on 2 L nasal cannula oxygen, CHF last EF 55-60% with grade 1 diastolic dysfunction and 02/1190, NSCLC with metastases s/p RU lobectomy/radiation/chemotherapy; who presents with complaints of shortness of breath since last night. He reports associated symptoms of wheezing, productive sounding cough, and worsening bilateral lower extremity swelling. Denies any hemoptysis, syncope, fever, chills, or chest pain. He just had a office appointment with his pulmonologist Dr. Lenna Gilford 4 days ago where there were adjustments made to his Lasix and the Decadron appears to have been discontinued. Due to the patient's shortness of breath history is otherwise limited.  ED Course:  Upon admission to the emergency department patient was evaluated and seen to be afebrile with all other vitals maintain including O2 saturations on 2 L of nasal cannula oxygen. Patient was given 2 DuoNeb's, 125 mg of Solu-Medrol IV in route with EMS with some mild improvement of breathing symptoms. Lab work otherwise reveals WBC 17.1, hemoglobin 12.2, platelets 145, sodium 129, potassium 4.9, chloride 93, CO2 30, BUN 20, creatinine 0.68, calcium 7.6, BNP 265.3, and troponin 0.02. Chest x-ray showed a small right-sided pleural effusion. CT scan of the chest showed a stable 2.7 cm abdominal aortic aneurysm.  Review of Systems: As per HPI otherwise 10 point review of systems negative.   Past Medical History  Diagnosis Date  . Arthritis   . Wheezing   . Sore throat   . Carotid artery occlusion   . COPD (chronic obstructive pulmonary disease) ([place])   . Lumbar disc disease   . Restless leg syndrome   . Allergic rhinitis   .  Hypertension   . Anxiety     anxiety  . Shortness of breath   . Lung mass   . GERD (gastroesophageal reflux disease)   . H/O hiatal hernia   . Pre-operative cardiovascular examination   . Nonspecific abnormal unspecified cardiovascular function study   . Peripheral vascular disease, unspecified ([place])   . Pneumonia 2014    ?   Marland Kitchen On home oxygen therapy     prn  . Constipation   . 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 S/P radiation therapy 12/04/14 srs    lt frontal brain  . Cancer ([place]) 09/14/12    invasive mod diff squamous cell ca  . 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 S/P radiation therapy 07/18/15    SRS left frontal brain 20Gy  . Seizures ([place]) 12/13/15    Past Surgical History  Procedure Laterality Date  . Carotid endarterectomy  10/15/2010    left  . Hand surgery      repair of the left long, ring, and small finger after a saw injury  . Video bronchoscopy  07/24/2012    Procedure: VIDEO BRONCHOSCOPY WITH FLUORO;  Surgeon: Kathee Delton, MD;  Location: [facility];  Service: Cardiopulmonary;  Laterality: Bilateral;  . Lobectomy Right 09/14/2012    Procedure: LOBECTOMY;  Surgeon: Ivin Poot, MD;  Location: [facility];  Service: Thoracic;  Laterality: Right;  RIGHT UPPER LOBECTOMY  . Video assisted thoracoscopy Right 09/14/2012    Procedure: VIDEO ASSISTED THORACOSCOPY;  Surgeon: Ivin Poot, MD;  Location: [facility];  Service: Thoracic;  Laterality: Right;  . Video bronchoscopy with endobronchial ultrasound N/A 08/27/2014    Procedure: VIDEO BRONCHOSCOPY WITH ENDOBRONCHIAL ULTRASOUND;  Surgeon: Ivin Poot, MD;  Location: [facility];  Service: Thoracic;  Laterality: N/A;  . Scalene node biopsy Right 08/27/2014    Procedure: BIOPSY SCALENE NODE;  Surgeon: Ivin Poot, MD;  Location: [facility];  Service: Thoracic;  Laterality: Right;  . Sterotactic radiosurgery Right 09/25/14    right parietal 30Gy/1 fx  . Power port a cath Right      reports that he quit smoking about  16 months ago. His smoking use included Cigarettes. He has a 55 pack-year smoking history. He quit smokeless tobacco use about 63 years ago. He reports that he drinks alcohol. He reports that he does not use illicit drugs.  No Known Allergies  Family History  Problem Relation Age of Onset  . Heart disease Father     MI  . Heart attack Father   . Alcohol abuse Mother   . Stroke Brother   . Heart disease Brother   . Depression Sister   . Heart attack Brother     Prior to Admission medications   Medication Sig Start Date End Date Taking? Authorizing Provider  amLODipine (NORVASC) 5 MG tablet Take 5 mg by mouth daily.   Yes Historical Provider, MD  aspirin EC 81 MG tablet Take 81 mg by mouth daily. Reported on 12/22/2015   Yes Historical Provider, MD  clonazePAM (KLONOPIN) 0.5 MG tablet Take 0.5 mg by mouth 2 (two) times daily as needed for anxiety. Takes one tablet by mouth two times daily as needed for anxiety.   Yes Historical Provider, MD  cloNIDine (CATAPRES) 0.1 MG tablet Take 0.1 mg by mouth 2 (two) times daily.   Yes Historical Provider, MD  dexamethasone (DECADRON) 4 MG tablet Take 4 mg by mouth 4 (four) times daily.  12/16/15  Yes Historical Provider, MD  furosemide (LASIX) 20 MG tablet Take 2 tablets (40 mg total) by mouth daily. 12/25/15  Yes Noralee Space, MD  guaifenesin (HUMIBID E) 400 MG TABS tablet Take 400 mg by mouth 2 (two) times daily.   Yes Historical Provider, MD  HYDROcodone-acetaminophen (NORCO) 7.5-325 MG tablet Take 1 tablet by mouth every 6 (six) hours as needed for moderate pain. 09/25/15  Yes Heather F Boelter, NP  ipratropium-albuterol (DUONEB) 0.5-2.5 (3) MG/3ML SOLN Take 3 mLs by nebulization 4 (four) times daily. Patient taking differently: Take 3 mLs by nebulization 4 (four) times daily. Pt takes nebulizer 2 to 3 times daily. 12/18/14  Yes Noralee Space, MD  isosorbide mononitrate (IMDUR) 30 MG 24 hr tablet Take 1 tablet (30 mg total) by mouth daily. 08/12/15  Yes  Jettie Booze, MD  levETIRAcetam (KEPPRA) 750 MG tablet Take 2 tablets (1,500 mg total) by mouth 2 (two) times daily. 12/24/15  Yes Penni Bombard, MD  lisinopril (PRINIVIL,ZESTRIL) 40 MG tablet Take 40 mg by mouth at bedtime.  04/23/15  Yes Historical Provider, MD  metoprolol tartrate (LOPRESSOR) 25 MG tablet Take 1 tablet (25 mg total) by mouth 2 (two) times daily. For systolic BP of 299 or greater take 50 mg BID 12/18/15  Yes Almyra Deforest, PA  omeprazole (PRILOSEC) 40 MG capsule Take 40 mg by mouth daily.   Yes Historical Provider, MD  pramipexole (MIRAPEX) 1 MG tablet Take 1 mg by mouth at bedtime.   Yes Historical Provider, MD  sodium chloride (OCEAN) 0.65 % SOLN nasal spray Place  1 spray into both nostrils as needed for congestion.   Yes Historical Provider, MD  traMADol (ULTRAM) 50 MG tablet Take 1 tablet (50 mg total) by mouth every 12 (twelve) hours as needed. Patient taking differently: Take 50 mg by mouth daily as needed (pain).  01/29/15  Yes Laurie Panda, NP  triamcinolone cream (KENALOG) 0.1 % Apply 1 application topically 2 (two) times daily as needed (itching/ dry skin).  10/30/15  Yes Historical Provider, MD  zolpidem (AMBIEN) 10 MG tablet Take 10 mg by mouth at bedtime as needed for sleep. Reported on 09/25/2015   Yes Historical Provider, MD  OXYGEN Inhale 2-3 L into the lungs continuous.    Historical Provider, MD    Physical Exam:    Constitutional: Elderly male who appears in moderate respiratory distress but able to follow commands.  Filed Vitals:   12/30/15 0330 12/30/15 0345 12/30/15 0400 12/30/15 0430  BP: 158/61  135/58 112/59  Pulse: 80  82 76  Temp:      TempSrc:      Resp: '17  18 8  '$ Height:      Weight:      SpO2: 96% 97% 96% 96%   Eyes: PERRL, lids and conjunctivae normal ENMT: Mucous membranes are moist. Posterior pharynx clear of any exudate or lesions.Normal dentition.  Neck: normal, supple, no masses, no thyromegaly Respiratory: Diffuse  bilateral wheezes and rhonchi appreciated. Normal respiratory effort. No accessory muscle use. Patient speaking in shorten sinuses Cardiovascular: Regular rate and rhythm, no murmurs / rubs / gallops. 2+ pitting edema of the bilateral lower extremity . 2+ pedal pulses. No carotid bruits.  Abdomen: no tenderness, no masses palpated. No hepatosplenomegaly. Bowel sounds positive.  Musculoskeletal: no clubbing / cyanosis. No joint deformity upper and lower extremities. Good ROM, no contractures. Normal muscle tone.  Skin: no rashes, lesions, ulcers. No induration Neurologic: CN 2-12 grossly intact. Sensation intact, DTR normal. Strength 5/5 in all 4.  Psychiatric: Normal judgment and insight. Alert and oriented x 3. Normal mood.     Labs on Admission: I have personally reviewed following labs and imaging studies  CBC:  Recent Labs Lab 12/24/15 0903 12/30/15 0131  WBC 7.0 17.1*  NEUTROABS 5.5 15.4*  HGB 12.2* 12.2*  HCT 37.8* 37.1*  MCV 96.4 94.6  PLT 158 702*   Basic Metabolic Panel:  Recent Labs Lab 12/24/15 0904 12/30/15 0131  NA 133* 129*  K 4.6 4.9  CL  --  93*  CO2 32* 30  GLUCOSE 109 115*  BUN 20.4 20  CREATININE 0.8 0.68  CALCIUM 8.0* 7.6*   GFR: Estimated Creatinine Clearance: 83.5 mL/min (by C-G formula based on Cr of 0.68). Liver Function Tests:  Recent Labs Lab 12/24/15 0904  AST 12  ALT 11  ALKPHOS 43  BILITOT 0.30  PROT 5.4*  ALBUMIN 3.0*   No results for input(s): LIPASE, AMYLASE in the last 168 hours. No results for input(s): AMMONIA in the last 168 hours. Coagulation Profile: No results for input(s): INR, PROTIME in the last 168 hours. Cardiac Enzymes: No results for input(s): CKTOTAL, CKMB, CKMBINDEX, TROPONINI in the last 168 hours. BNP (last 3 results) No results for input(s): PROBNP in the last 8760 hours. HbA1C: No results for input(s): HGBA1C in the last 72 hours. CBG: No results for input(s): GLUCAP in the last 168 hours. Lipid  Profile: No results for input(s): CHOL, HDL, LDLCALC, TRIG, CHOLHDL, LDLDIRECT in the last 72 hours. Thyroid Function Tests: No results for input(s):  TSH, T4TOTAL, FREET4, T3FREE, THYROIDAB in the last 72 hours. Anemia Panel: No results for input(s): VITAMINB12, FOLATE, FERRITIN, TIBC, IRON, RETICCTPCT in the last 72 hours. Urine analysis:    Component Value Date/Time   COLORURINE YELLOW 12/13/2015 [place] 12/13/2015 1405   LABSPEC 1.008 12/13/2015 1405   PHURINE 5.5 12/13/2015 1405   GLUCOSEU NEGATIVE 12/13/2015 1405   HGBUR NEGATIVE 12/13/2015 1405   BILIRUBINUR NEGATIVE 12/13/2015 1405   KETONESUR NEGATIVE 12/13/2015 1405   PROTEINUR NEGATIVE 12/13/2015 1405   UROBILINOGEN 1.0 11/11/2014 0900   NITRITE NEGATIVE 12/13/2015 1405   LEUKOCYTESUR NEGATIVE 12/13/2015 1405   Sepsis Labs: No results found for this or any previous visit (from the past 240 hour(s)).   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 EXAM: CT CHEST, ABDOMEN, AND PELVIS WITH CONTRAST TECHNIQUE: Multidetector CT imaging of the chest, abdomen and pelvis was performed following the standard protocol during bolus administration of intravenous contrast. CONTRAST:  180m ISOVUE-300 IOPAMIDOL (ISOVUE-300) INJECTION 61% COMPARISON:  10/10/2015 CT chest, abdomen and pelvis. FINDINGS: CT CHEST Mediastinum/Nodes: Normal heart size. No significant pericardial fluid/thickening. Right internal jugular MediPort terminates in the lower third of the superior vena cava. Left main, left anterior descending, left circumflex and right coronary atherosclerosis. Atherosclerotic nonaneurysmal thoracic aorta. Normal caliber pulmonary arteries. No central pulmonary emboli. No discrete thyroid nodules. Unremarkable esophagus. No pathologically enlarged axillary, mediastinal or hilar lymph nodes.  Lungs/Pleura: No pneumothorax. Stable small simple layering right pleural effusion. New trace layering left pleural effusion. Layering secretions are present in the lower trachea and bilateral mainstem bronchi. Status post right upper lobectomy. Mild centrilobular emphysema and diffuse bronchial wall thickening. No acute consolidative airspace disease or significant pulmonary nodules. No appreciable change in patchy subpleural reticulation throughout both lungs. Musculoskeletal: No aggressive appearing focal osseous lesions. Stable incompletely healed nondisplaced posterior right seventh rib fracture. Stable healed deformity in the lateral right eighth rib. CT ABDOMEN AND PELVIS Hepatobiliary: Normal liver with no liver mass. Normal gallbladder with no radiopaque cholelithiasis. No biliary ductal dilatation. Pancreas: Normal, with no mass or duct dilation. Spleen: Normal size. No mass. Adrenals/Urinary Tract: Normal adrenals. No hydronephrosis. No renal masses. Normal bladder. Stomach/Bowel: Grossly normal stomach. Normal caliber small bowel with no small bowel wall thickening. Stable moderate duodenal diverticulum in the superior third portion of the duodenum. Normal appendix. Normal large bowel with no diverticulosis, large bowel wall thickening or pericolonic fat stranding. Vascular/Lymphatic: Atherosclerotic abdominal aorta with 2.7 cm infrarenal abdominal aortic aneurysm, unchanged. Patent portal, splenic, hepatic and renal veins. No pathologically enlarged lymph nodes in the abdomen or pelvis. Reproductive: Top-normal size prostate. Other: No pneumoperitoneum.  Trace free fluid in the pelvis. Musculoskeletal: No aggressive appearing focal osseous lesions. Marked degenerative changes in the lumbar spine. Mild anasarca. IMPRESSION: 1. No evidence of metastatic disease in the chest, abdomen or pelvis . 2. No evidence of local tumor recurrence in the right lung status post right upper lobectomy . 3. Stable small  right and new trace left pleural effusions. 4. Additional findings include left main and 3 vessel coronary atherosclerosis, mild centrilobular emphysema with diffuse bronchial wall thickening suggesting of COPD, and stable 2.7 cm infrarenal abdominal aortic aneurysm. Electronically Signed   By: JIlona SorrelM.D.   On: 12/29/2015 08:25   Ct Abdomen Pelvis W Contrast  12/29/2015  CLINICAL DATA:  Metastatic squamous cell right upper lobe  lung carcinoma presents for restaging on treatment. EXAM: CT CHEST, ABDOMEN, AND PELVIS WITH CONTRAST TECHNIQUE: Multidetector CT imaging of the chest, abdomen and pelvis was performed following the standard protocol during bolus administration of intravenous contrast. CONTRAST:  179m ISOVUE-300 IOPAMIDOL (ISOVUE-300) INJECTION 61% COMPARISON:  10/10/2015 CT chest, abdomen and pelvis. FINDINGS: CT CHEST Mediastinum/Nodes: Normal heart size. No significant pericardial fluid/thickening. Right internal jugular MediPort terminates in the lower third of the superior vena cava. Left main, left anterior descending, left circumflex and right coronary atherosclerosis. Atherosclerotic nonaneurysmal thoracic aorta. Normal caliber pulmonary arteries. No central pulmonary emboli. No discrete thyroid nodules. Unremarkable esophagus. No pathologically enlarged axillary, mediastinal or hilar lymph nodes. Lungs/Pleura: No pneumothorax. Stable small simple layering right pleural effusion. New trace layering left pleural effusion. Layering secretions are present in the lower trachea and bilateral mainstem bronchi. Status post right upper lobectomy. Mild centrilobular emphysema and diffuse bronchial wall thickening. No acute consolidative airspace disease or significant pulmonary nodules. No appreciable change in patchy subpleural reticulation throughout both lungs. Musculoskeletal: No aggressive appearing focal osseous lesions. Stable incompletely healed nondisplaced posterior right seventh rib  fracture. Stable healed deformity in the lateral right eighth rib. CT ABDOMEN AND PELVIS Hepatobiliary: Normal liver with no liver mass. Normal gallbladder with no radiopaque cholelithiasis. No biliary ductal dilatation. Pancreas: Normal, with no mass or duct dilation. Spleen: Normal size. No mass. Adrenals/Urinary Tract: Normal adrenals. No hydronephrosis. No renal masses. Normal bladder. Stomach/Bowel: Grossly normal stomach. Normal caliber small bowel with no small bowel wall thickening. Stable moderate duodenal diverticulum in the superior third portion of the duodenum. Normal appendix. Normal large bowel with no diverticulosis, large bowel wall thickening or pericolonic fat stranding. Vascular/Lymphatic: Atherosclerotic abdominal aorta with 2.7 cm infrarenal abdominal aortic aneurysm, unchanged. Patent portal, splenic, hepatic and renal veins. No pathologically enlarged lymph nodes in the abdomen or pelvis. Reproductive: Top-normal size prostate. Other: No pneumoperitoneum.  Trace free fluid in the pelvis. Musculoskeletal: No aggressive appearing focal osseous lesions. Marked degenerative changes in the lumbar spine. Mild anasarca. IMPRESSION: 1. No evidence of metastatic disease in the chest, abdomen or pelvis . 2. No evidence of local tumor recurrence in the right lung status post right upper lobectomy . 3. Stable small right and new trace left pleural effusions. 4. Additional findings include left main and 3 vessel coronary atherosclerosis, mild centrilobular emphysema with diffuse bronchial wall thickening suggesting of COPD, and stable 2.7 cm infrarenal abdominal aortic aneurysm. Electronically Signed   By: JIlona SorrelM.D.   On: 12/29/2015 08:25   Dg Chest Portable 1 View  12/30/2015  CLINICAL DATA:  75year old male with shortness of breath and wheezing EXAM: PORTABLE CHEST 1 VIEW COMPARISON:  CT dated 12/29/2015 FINDINGS: Single portable view of the chest demonstrates emphysematous changes of the  lungs. There is a small right pleural effusion as seen on the prior CT. There is associated partial compressive atelectasis of the right lung base. Underlying infiltrate is not excluded. No focal consolidation or pneumothorax. Right pectoral Port-A-Cath with tip over central SVC. 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 The cardiac silhouette is within normal limits with no acute osseous pathology. IMPRESSION: Small right pleural effusion. No focal consolidation or pneumothorax. Electronically Signed   By: AAnner CreteM.D.   On: 12/30/2015 02:12    EKG: Independently reviewed.  Sinus rhythm  Assessment/Plan COPD exacerbation/possible HCAP: Acute. Patient presents with acute symptoms of shortness of breath. Found to have diffuse  bilateral wheezing on physical exam. - admit to telemetry bed - Follow-up sputum and blood cultures  - Empiric antibiotics of vancomycin and Zosyn  - Duoneb every 4 hours and prn every 2 hours  - Brovana and Budesonide nebs every 12 hours - Continue home Decadron regimen - Would contact patient's pulmonologist and p.m.  Leukocytosis: Acute. WBC 17.1. Could be secondary to recent steroids versus underlying infection - Continue to monitor  Chronic respiratory failure with hypercapnia: ABG revealing compensated pH of 7.351 with PCO2 of 58.6 and a PO2 of 87.2 - Continuous pulse oximetry with nasal cannula oxygen to keep O2 sats greater than 90%  Suspected Acute exacerbation of Congestive heart failure: Patient with elevated BNP of 249 on admission previous echocardiogram done in 10/2014 showed EF 55-60% with grade 1 diastolic dysfunction. Patient was 2+ pitting edema bilateral lower extremities suggests possible fluid overload. - Strict In&Outs - Daily weights - Lasix 40 mg IV x 1 dose now, Reassess in a.m. for need of further diuresis  - Echocardiogram complete   Non small cell lung cancer with  metastases: Patient currently undergoing radiation treatments.  - Would notified Dr. Kyung Rudd at the patient has present in the hospital   Essential hypertension - Continue amlodipine, clonidine, metoprolol, lisinopril - May consider holding blood pressure medications during diuresis if needed   Anemia/thrombocytopenia: Chronic. Hemoglobin 12.2 with platelets 145 on admission. - Continue to monitor  Hyponatremia could be secondary to patient being in acute CHF - Continue to monitor  Anxiety  - Continue Klonopin prn  Insomnia - Continue Ambien   Infrarenal abdominal aortic aneurysm 2.7 cm and stable. - Continue to monitor  GERD  -  Pharmacy substitution of Protonix for omeprazole  DVT prophylaxis: Lovenox  Code Status: Full Family Communication: None Disposition Plan: Undetermined Consults called: None Admission status: Observation telemetry  Norval Morton MD [facility] Pager [phone]  If 7PM-7AM, please contact night-coverage [email or web address] Password [email or web address]  12/30/2015, 4:55 AM

## 2015-12-31 ENCOUNTER — Other Ambulatory Visit: Payer: Medicare Other

## 2015-12-31 ENCOUNTER — Ambulatory Visit: Payer: Medicare Other | Admitting: Internal Medicine

## 2016-01-01 ENCOUNTER — Encounter (HOSPITAL_COMMUNITY)
Admission: RE | Admit: 2016-01-01 | Discharge: 2016-01-01 | Disposition: A | Discharge status: Home / Self Care | Payer: Medicare Other | Source: Ambulatory Visit | Attending: Neurosurgery | Admitting: Neurosurgery

## 2016-01-01 ENCOUNTER — Encounter (HOSPITAL_COMMUNITY): Payer: Self-pay

## 2016-01-01 DIAGNOSIS — J9612 Chronic respiratory failure with hypercapnia: Secondary | ICD-10-CM | POA: Diagnosis not present

## 2016-01-01 DIAGNOSIS — F419 Anxiety disorder, unspecified: Secondary | ICD-10-CM | POA: Diagnosis not present

## 2016-01-01 DIAGNOSIS — Z510 Encounter for antineoplastic radiation therapy: Secondary | ICD-10-CM | POA: Diagnosis not present

## 2016-01-01 DIAGNOSIS — R601 Generalized edema: Secondary | ICD-10-CM | POA: Diagnosis present

## 2016-01-01 DIAGNOSIS — K219 Gastro-esophageal reflux disease without esophagitis: Secondary | ICD-10-CM | POA: Diagnosis not present

## 2016-01-01 DIAGNOSIS — J449 Chronic obstructive pulmonary disease, unspecified: Secondary | ICD-10-CM | POA: Diagnosis not present

## 2016-01-01 DIAGNOSIS — I10 Essential (primary) hypertension: Secondary | ICD-10-CM | POA: Diagnosis not present

## 2016-01-01 DIAGNOSIS — J9611 Chronic respiratory failure with hypoxia: Secondary | ICD-10-CM | POA: Diagnosis not present

## 2016-01-01 HISTORY — DX: Headache: R51

## 2016-01-01 HISTORY — DX: Tremor, unspecified: R25.1

## 2016-01-01 HISTORY — DX: Personal history of antineoplastic chemotherapy: Z92.21

## 2016-01-01 HISTORY — DX: Headache, unspecified: R51.9

## 2016-01-01 LAB — BASIC METABOLIC PANEL
ANION GAP: 5 (ref 5–15)
BUN: 25 mg/dL — ABNORMAL HIGH (ref 6–20)
CHLORIDE: 90 mmol/L — AB (ref 101–111)
CO2: 33 mmol/L — AB (ref 22–32)
Calcium: 8 mg/dL — ABNORMAL LOW (ref 8.9–10.3)
Creatinine, Ser: 0.83 mg/dL (ref 0.61–1.24)
GFR calc Af Amer: >60 mL/min (ref >60)
GLUCOSE: 77 mg/dL (ref 65–99)
POTASSIUM: 5 mmol/L (ref 3.5–5.1)
Sodium: 128 mmol/L — ABNORMAL LOW (ref 135–145)

## 2016-01-01 LAB — CBC
HEMATOCRIT: 33.9 % — AB (ref 39.0–52.0)
HEMOGLOBIN: 10.9 g/dL — AB (ref 13.0–17.0)
MCH: 29.9 pg (ref 26.0–34.0)
MCHC: 32.2 g/dL (ref 30.0–36.0)
MCV: 93.1 fL (ref 78.0–100.0)
Platelets: 146 10*3/uL — ABNORMAL LOW (ref 150–400)
RBC: 3.64 MIL/uL — ABNORMAL LOW (ref 4.22–5.81)
RDW: 15.1 % (ref 11.5–15.5)
WBC: 9.9 10*3/uL (ref 4.0–10.5)

## 2016-01-01 NOTE — Progress Notes (Signed)
PCP - Dr. [name] Cardiologist - Dr. Irish Lack Pulmonologist - Dr. Teressa Lower  EKG - 12/30/15 CXR - 12/30/15  Echo - 12/30/15 Stress test - 2013 Cardiac Cath - denies  Patient denies chest pain and acute shortness of breath at PAT appointment.  Patient was discharged from [facility] 12/30/15 with COPD exacerbation.  Patient states that he his breathing is back to baseline but that he still seems to have extra fluid.    Myra Gianotti, PA consulted at PAT visit.

## 2016-01-01 NOTE — Progress Notes (Addendum)
Anesthesia PAT Evaluation: Patient is a 75 year old male scheduled for craniotomy for tumor excision with curve on 01/06/16 by Dr. Saintclair Halsted. Reportedly, he is scheduled for radiation therapy on 01/05/16.  History includes lung cancer s/p RU lobectomy and chemo/immuno -therapies '14 with brain mets s/p stereotactic radiosurgery '16 and on-going palliative chemotherapy, COPD with home O2 (2L/[place]), former smoker (quit 08/16/14), PAD with right SCA stenosis, s/p left carotid endarterectomy '12, hiatal hernia, GERD, RLS, anxiety, right Port-a-cath.  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  - Admitted [facility] 12/30/15 - 12/30/15 with COPD exacerbation. He presented with increased SOB. He said it started when his sinuses "swelled up". CXR did not show evidence of PNA. He was treated with steroid, antibiotics, nebs, diuresis. Echo showed normal LVEF. He had mild hyponatremia felt related to his lung cancer.  PCP - [name], PA-C [facility], [facility]) Cardiologist - Dr. Irish Lack. According to 12/23/15 telephone encounter, "No further cardiac testing needed before surgery. He is at moderate risk of cardiac complications (approximately 3%) due to his known CAD, but this risk cannot be modified." Pulmonologist - Dr. Teressa Lower, last seen on 12/25/15 with 01/02/16 follow-up scheduled. Patient had anasarca. He decreased amlodipine and increased Lasix. He noted patient's plans for surgery.  Neurologist - Dr. Leta Baptist. RAD-ONC - Dr. Kyung Rudd HEM-ONC - [name] Vascular surgeon - Dr. Scot Dock (last visit Clemon Chambers, NP on 05/01/14)  Meds include amlodipine, ASA 81 mg (last dose 01/01/16), Zithromax (last dose 01/06/16), Klonopin, clonidine, Decadron, Lasix, Humibid, Norco, Duoneb, Imdur, Keppra, lisinopril, Lopressor, Prilosec, Mirapex, tramadol. He reports Lasix recently increased to 40 mg daily (Dr.  Leeanne Deed) and Decadron decreased for 4 mg QID to TID (Dr.Moody).   PAT Vitals: BP 169/66, HR 65, RR 20, T 36.2C, O2 sat 100% 2L/[facility]. Exam: He is in a hospital wheelchair with O2/[place]. Heart RRR, II/VI SEM. Lungs clear posteriorly after cough. Anterior upper lobes sound mildly coarse (mildly congested). He has pitting edema in his bilateral tricep region (where arm is resting against wheelchair handle), and ~ 2+ BLE pitting edema. He has mild conversational dyspnea which he says is his baseline. He has an occasional productive cough, but says this is also his baseline. He has noticed increased BLE and BUE edema over the past couple of weeks. In fact, Dr. Leeanne Deed increased his Lasix last week with one week follow-up planned tomorrow. He sleeps in a hospital bed or recliner to keep his feet elevated. His HOB is at ~ 45 degrees. He cannot tolerate lying flat due to SOB. He is using his nebulizer anywhere from 2-4 times/day. He says Dr. Leeanne Deed wants him to use QID. He denied chest pain, palpitations, or syncope.    12/30/15 EKG: SR, minimal ST depression inferior leads. No significant change since last tracing.   12/30/15 Echo: Study Conclusions - Procedure narrative: Transthoracic echocardiography. Image  quality was adequate. The study was technically difficult due to  variations in respirations and due to patient coughing. - Left ventricle: The cavity size was normal. There was moderate  focal basal hypertrophy of the septum and mild hypertrophy of the  posterior wall. Systolic function was normal. The estimated  ejection fraction was in the range of 55% to 60%. Wall motion was  normal; there were no regional wall motion abnormalities. Left  ventricular diastolic function parameters were normal. - Aortic valve: Transvalvular velocity was within the  normal range.  There was no stenosis. There was no regurgitation. - Mitral valve: There was no regurgitation. - Right ventricle: The cavity size was  normal. Wall thickness was  normal. Systolic function was normal. - Tricuspid valve: There was trivial regurgitation.  05/01/14 Carotid U/S: Patent left CEA site with no LICA stenosis. Doppler velocities suggest 60-79% RICA stenosis. Known right SCA steal noted. No significant change when compared to previous exam on 10/17/12.  09/08/12: Cardiac cath: IMPRESSIONS: 1. Normal left main coronary artery. 2. 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 3. Widely patent left circumflex artery and its branches. 4. Chronically occluded proximal right coronary artery. RCA fills via microchannel antegrade, and with brisk left to right collaterals. 5. Overall, normal left ventricular systolic function. Basal inferior hypokinesis. LVEDP 14 mmHg. Ejection fraction 50%. RECOMMENDATION: CTO of RCA explains mild stress test abnormality. He has some antegrade flow and brisk collaterals. Since he is not having symptoms, would continue medical therapy. No further cardiac testing needed prior to surgery.  06/16/12 Nuclear stress test: There is evidence of mild ischemia in the basal inferior region, TID ratio increased, but this is not apparent visually. Post-stress LV function is normal, EF 54%, no regional wall motion abnormalities. He was started on Imdur. Dr. Irish Lack did not initially recommend cardiac cath unless patient failed medical therapy, but ultimately patient was sent for cardiac cath prior to undergoing RU lobectomy (see above).  12/30/15 1V CXR: IMPRESSION: Small right pleural effusion. No focal consolidation or pneumothorax.  12/29/15 CT Chest/abd/pelvis: IMPRESSION: 1. No evidence of metastatic disease in the chest, abdomen or pelvis. 2. No evidence of local tumor recurrence in the right lung status post right upper lobectomy . 3. Stable small right and new trace left pleural effusions. 4. Additional findings include left main and 3 vessel  coronary atherosclerosis, mild centrilobular emphysema with diffuse bronchial wall thickening suggesting of COPD, and stable 2.7 cm infrarenal abdominal aortic aneurysm.  01/17/15 Spirometry: FVC 2.35 (53%), FEV1 1.1 (33%), FEV1/FVC ratio 47% (62%), FEF25-75 0.40 (13%). Very severe obstruction with low vital capacity.  Preoperative labs noted. Na 128 (previously 129 on 12/30/15 and 133 on 12/24/15), K 5.0. Cr 0.83, BUN 25 (up from 20 12/30/15). Glucose 77. H/H 10.9/33.9. PLT 146K. T&S done. (ABG on 12/30/15 during COPD exacerbation showed pH 7.35, pCO2 58.6, pO2 87.2, HCO3 31.8.)  Even though patient was recently discharged for COPD exacerbation, he feels pretty much at his baseline from a pulmonary standpoint. His edema has been about the same for the past several weeks, but is worse from a few months ago. I am glad he is already scheduled to see Dr. Leeanne Deed tomorrow since I think it will be good to get his input. We need to see if he feels patient is (or can be) optimized before surgery. With his recent COPD exacerbation, does surgery need to be postponed or is patient really back at his baseline? He may prefer to keep patient intubated post-operatively until seen by PCCM. I will reach out to Dr. Leeanne Deed in hopes patient's pulmonary surgical risk can be addressed at his appointment tomorrow. Anesthesiologist Dr. Tobias Alexander agrees with this plan. He also recommended repeating STAT BMET on the day of surgery to re-evaluate hyponatremia and hyperkalemia.   Chart will be left for follow-up re: Dr. Daneil Dolin notes/recommendations.  [facility] Short Stay Center/Anesthesiology Phone [phone] 01/01/2016 3:11 PM  Addendum: I have been in communication with Dr. Leeanne Deed. He saw  patient on 01/02/16. BLE venous Duplex ordered and showed no evidence of DVT of superficial thrombus. He discussed surgery plans with patient. Felt to be an ASA 3 surgical risk. Dr. Leeanne Deed states patient is clearly a high risk, and  it would be difficult to predict how he will fair post-operatively but overall he has done well over the past three years with prior surgery and chemoradiation. Patient wants to be aggressive in managing his metastatic disease, knowing that treatments are palliative in nature. Dr. Leeanne Deed will leave it up to his anesthesia team whether or not to attempt extubation or direct admit to ICU for PCCM management. Regardless, Dr. Leeanne Deed does recommend that PCCM be involved in Mr. Wakefield care to assist with diuresis, respiratory management, nutritional support, etc, post-operatively. I have notified anesthesiologist Dr. Marcie Bal and Lorriane Shire at Dr. Windy Carina office. Reportedly, Dr. Saintclair Halsted is meeting Mr. Nazari today when he goes in to radiology (radiotherapy ?). Further evaluation by his anesthesiologist tomorrow.  [facility] Short Stay Center/Anesthesiology Phone [phone] 01/05/2016 12:39 PM

## 2016-01-01 NOTE — Pre-Procedure Instructions (Signed)
[name]  01/01/2016      [facility] - [place], [place] - [address] [place] [address] Phone: [phone] Fax: [phone]    Your procedure is scheduled on Tuesday, June 27th, 2017.  Report to [facility] Admitting at 5:30 A.M.   Call this number if you have problems the morning of surgery:  [phone]   Remember:  Do not eat food or drink liquids after midnight.   Take these medicines the morning of surgery with A SIP OF WATER: Amlodipine (Norvasc), Clonazepam (Klonopin), Clonidine (Catapres), Dexamethasone (Decadron), Hydrocodone-acetaminophen (Norco) if needed, Duoneb Nebulizer, Isosorbide Mononitrate (Imdur), Levetiracetam (Keppra), Metoprolol Tartrate (Lopressor), Omeprazole (Prilosec), Tramadol (Ultram) if needed.  Stop taking: Aspirin, NSAIDS, Aleve, Naproxen, Ibuprofen, Advil, Motrin, BC's, Goody's, Fish oil, all herbal medications, and all vitamins.    Do not wear jewelry.  Do not wear lotions, powders, or colognes.    Men may shave face and neck.  Do not bring valuables to the hospital.   [facility] is not responsible for any belongings or valuables.  Contacts, dentures or bridgework may not be worn into surgery.  Leave your suitcase in the car.  After surgery it may be brought to your room.  For patients admitted to the hospital, discharge time will be determined by your treatment team.  Patients discharged the day of surgery will not be allowed to drive home.   Special instructions:  See attached.   Please read over the following fact sheets that you were given.

## 2016-01-02 ENCOUNTER — Ambulatory Visit (HOSPITAL_COMMUNITY)
Admission: RE | Admit: 2016-01-02 | Discharge: 2016-01-02 | Disposition: A | Discharge status: Home / Self Care | Payer: Medicare Other | Source: Ambulatory Visit | Attending: Cardiology | Admitting: Cardiology

## 2016-01-02 ENCOUNTER — Telehealth: Payer: Self-pay | Admitting: Pulmonary Disease

## 2016-01-02 ENCOUNTER — Encounter: Payer: Self-pay | Admitting: Pulmonary Disease

## 2016-01-02 ENCOUNTER — Ambulatory Visit (INDEPENDENT_AMBULATORY_CARE_PROVIDER_SITE_OTHER): Payer: Medicare Other | Admitting: Pulmonary Disease

## 2016-01-02 VITALS — BP 156/70 | HR 66 | Temp 97.6°F | Ht 69.0 in | Wt 186.2 lb

## 2016-01-02 DIAGNOSIS — K219 Gastro-esophageal reflux disease without esophagitis: Secondary | ICD-10-CM | POA: Insufficient documentation

## 2016-01-02 DIAGNOSIS — J9611 Chronic respiratory failure with hypoxia: Secondary | ICD-10-CM

## 2016-01-02 DIAGNOSIS — C7931 Secondary malignant neoplasm of brain: Secondary | ICD-10-CM | POA: Diagnosis not present

## 2016-01-02 DIAGNOSIS — J449 Chronic obstructive pulmonary disease, unspecified: Secondary | ICD-10-CM

## 2016-01-02 DIAGNOSIS — I10 Essential (primary) hypertension: Secondary | ICD-10-CM | POA: Insufficient documentation

## 2016-01-02 DIAGNOSIS — I248 Other forms of acute ischemic heart disease: Secondary | ICD-10-CM

## 2016-01-02 DIAGNOSIS — J9612 Chronic respiratory failure with hypercapnia: Secondary | ICD-10-CM

## 2016-01-02 DIAGNOSIS — R601 Generalized edema: Secondary | ICD-10-CM

## 2016-01-02 DIAGNOSIS — C3490 Malignant neoplasm of unspecified part of unspecified bronchus or lung: Secondary | ICD-10-CM | POA: Diagnosis not present

## 2016-01-02 DIAGNOSIS — F419 Anxiety disorder, unspecified: Secondary | ICD-10-CM | POA: Insufficient documentation

## 2016-01-02 MED ORDER — FUROSEMIDE 40 MG PO TABS: 40.0000 mg | ORAL_TABLET | Freq: Two times a day (BID) | ORAL | Status: AC

## 2016-01-02 MED ORDER — DEXAMETHASONE 4 MG PO TABS: 4.0000 mg | ORAL_TABLET | Freq: Three times a day (TID) | ORAL | Status: DC

## 2016-01-02 MED ORDER — LEVOFLOXACIN 500 MG PO TABS: 500.0000 mg | ORAL_TABLET | Freq: Every day | ORAL | Status: DC

## 2016-01-02 NOTE — Patient Instructions (Signed)
Today we updated your med list in our EPIC system...     We decided to increase the LASIX (Furosemide) diuretic further to '40mg'$  twice daily...  Remember- NO SALT/ sodium/ etc... Keep your legs elevated...  Increase the GUAIFENESIN '400mg'$  tabs to 2 tabs THREE times daily w/ fluids...  Add the [facility] antibiotic- one tab daily til gone...  We will check a VENOUS DOPPLER test on your legs to rule out any poss of a blood clot in your lower extremities...  We will request the Critical Care Team to check you during the post op period to aide in weaning from the breathing machine and helping to get rid of the fluid...  Call for any questions or if we can be of service in any way.Marland KitchenMarland Kitchen

## 2016-01-02 NOTE — Progress Notes (Signed)
Subjective:    Patient ID: [name], male    DOB: [birth year], 75 y.o.   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 PCP= [facility] & Dr. Christella Noa [facility] at [facility]);  Oncology= DrMohamed;  XRT= DrMoody;  Cards= DrVaranasi;  Neurology= DrPenumali;  NS= DrCram;  VascSurg= DrDickson    Baseline PFT 11/2011 showed FVC=3.17 (72%), FEV1=1.90 (63%), %1sec=60, mid-flows reduced at 32% predicted; 3% improvement in FEV1 after bronchodil; air trapping on LVs and DLCO was wnl...   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   3/14> he had RULobectomy by Dr. 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  He saw DrMohamed in consult & was quoted a 5-10% 86yrsurvival w/ 4 cycles of adjuvant chemotherapy => completed 12/2012...  He remained disease free for 1.5 yrs, but continued smoking...   F/u CT Chest 07/2014 showed new pulm nodules and extensive adenopathy- mediastinal, hilar, axillary...  Subseq PET scan 1/16 showed pos for widespread lymphangitic dis in both lungs, bilat hilar/ mediastinal/ right axillary, right supraclavic nodes, and right adrenal gland...  MRI Br 1/16 showed small enhancing lesion in right parietal lobe...  DrMohamed referred to Dr. VPrescott Gumfor bronch w/ EUS bx of mediastinal nodes=> pos EBUS and scalene node bx w/ metastatic squamous cell cancer.  3/16> DrM  indicated to pt that his disease is incurable & further treatment palliative => they proceeded w/ further chemotherapy, 4 cycles completed 5/16...  He saw DrMoody for XRT 3/16 due to HAs & they gave 20Gy radiation in 1 fraction but HAs continued...  5/16> repeat MRI Brain showed resolution of small right parietal lesion , but new 362mleft frontal lesion & acute punctate small vessel infarct in the left centrum semiovale => one more dose of 20Gy radiation given 12/04/14...  F/u CT Chest/ Abd/ Pelvis 12/06/14 showed a mixed response to therapy w/ innumerable pulm nodules- some same or smaller, but nodes same or larger, liver ok but right adrenal met larger, & right kidney lesion is suspicious for met; vascular & coronary calcif, extensive spondylosis in lumbar spine...  DrMohamed offered the pt Opdivo immunotherapy & 1st dose given 12/18/14=>  Immunotherapy with Nivolumab 3 MG/KG every 2 weeks. Status post 4 cycles.  Stereotactic radiosurgery to multiple brain metastasis performed on 03/12/2015. Completed his radiation treatments on 06/20/15  New left inferior frontal tumor radiation on 07/04/15, to a dose of 20 gray in 1 fraction, to be completed Friday 1/6  Systemic chemotherapy with Docetaxel 75 MG/M2 and Cyramza 10 MG/KG every 3 weeks with Neulasta support. Status post 11 cycles (starting from cycle #9 pt was treated w/ Cyramza only due to Docetaxel intolerance).   ~  December 18, 2014:  Add-on appt w/ SN for increased SOB & cough> pt c/o several month hx of increased SOB, cough, thick beige sput & intermittent wheezing, no hemoptysis; now on Opdivo for lung cancer  palliative rx- I reviewed his hx in detail & went over his recent CT Chest/ Abd Pelvis results (their understanding was minimal & he does not have a grasp of the prognosis- eg wife was surprised about the atherosclerosis & wanted to know what could be done about this)...    COPD, quit smoking 09/2014> on O2 at 2-3L/min, NEBS w/ Albut prn; he is  very congested w/ bilat rhonchi & wheezing; we discussed Rx w/ DUONEB Qid, MUCINEX Qid + fluids, and Depo80+PRED taper...    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    HBP & ASHD> followed by [facility], his notes reviewed, on ECASA81, Metop25/d, Amlod10; BP= 150/80 and he knows that he can take an extra Metop25 daily if needed for BP...    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    GERD> on Protionix40    RLS> on Mirapex 53m Qhs...    Anxiety/ insomnia> on Klonopin0.5Bid, Ativan0.5 prn, Ambien10 prn...    Anemia> Labs by DrMohammed 12/05/14 showed Hg=7.6 & he was Tx 2u w/ Hg improved to 10.9 by 12/11/14...  We reviewed prob list, meds, xrays and labs>   2DEcho 10/2014>  Mild focal basal hypertrophy of the septum, norm LVF w/ EF=55-60%, no regional wall motion abn, Gr1DD, no sigif valve abn, norm RV...  CXR 12/05/14 showed norm heart size, diffuse interstitial coarsening & nodularity w/ scarring, s/p right lung surg, etc...  LABS 6/16: reviewed in Epic:  Chems- ok, BS=119, Alb=3.4;  CBC- Hg=10.9 after 2u PCs 12/05/14 for Hg=7.6 PLAN>>  We discussed optimizing his bronchodil, anti-inflamm, & mucolytic rx w/ DUONEB QID regularly, PRED taper (see AVS), and MUCINEX 6071mid +fluids; he will also take the KlKleinBid regularly and extra Ativan 0.60m92mrn;  He will f/u w/ me in 3-4 wks...    ~  January 17, 2015:  80mo60mo w/ SN>  RogeDaijonorts improved- he's been using the NEBULIZER w/ Duoneb 2-3 x daily, taking MucinexBid, Klonopin0.5Bid, & he finished the Pred taper (it helped); he has mild cough, sm amt beige sputum (less thick), no CP;  Using O2 2-3L/min which he adjusts up & down as needed;  He saw DrMohamed 2d ago & he walked on RA w/ desat to 85% he says;  Pt & wife tell me that DrMohamed  insisted that he NOT take any more steroids [facility]  EXAM reveals Afeb, VSS, O2sat=94% on 2L/min;  HEENT- neg, mallampati1;  Chest- decr BS w/ few bibasilar wheezes and rhonchi;  Heart- RR gr1/6 SEM w/o r/g;  Abd- soft, neg;  Ext- w/o c/c/e... We reviewed prob list, meds, xrays and labs>   Spirometry 01/17/15 showed FVC=2.35 (53%), FEV1=1.11 (33%), %1sec=47, mid-flows are reduced at 13% predicted; c/w severe airflow obstruction & GOLD Stage 3-4 COPD... IMP/PLAN>>  Again asked him to do the Nebulizer w/ Duoneb Qid, Add PULMICORT twisthaler- 2spBid, take MUCINEX600-2Bid, continue cardiac meds, continue KlonopinBid... Continue Oxygen at 2-3L/min...   ~  July 31, 2015:  50mo 27mo& urgent add-on appt w/ SN per DrMohamed> [name] to me that [facility]- he was disappointed "They didn't do nothing, I didn't qualify"; today he is c/o increased SOB "since last weeks shot" (Neulasta); c/o cough, thick yellow sput, few blood streaks, sore throat, had nose bleed too, but no  f/c/s...    I reviewed interval notes from Oncology> last seen 07/29/15 & just finished cycle5 of Taxotere/cyramza w/ Neulasta support, s/p additional brain radiation 07/18/15, increased resp issues noted, incr edema on Lasix20/d;  CT Angio Chest 07/29/15 showed no emboli, post surg changes on right, no evid of recurrent malignancy, +mucus in trachea & sm effusions => refer to Pulm.  PRIOR THERAPY:  1) Status post right upper lobectomy under the care of Dr. Prescott Gum on 09/14/2012.   2) Adjuvant chemotherapy with cisplatin 75 mg/M2 and docetaxel 75 mg/M2 with Neulasta support every 3 weeks, status post 1 cycle. Starting with cycle #2, patient will receive carboplatin for an AUC initially of 4.5 and paclitaxel at 175 mg per meter squared with Neulasta support given every 3 weeks. Status post a total of 3 cycles.   3) Systemic chemotherapy with carboplatin for AUC of 5 on day 1 and gemcitabine 1000 MG/M2  on days 1 and 8 every 3 weeks. First dose 09/11/2014. Status post 4 cycles. Discontinued secondary to intolerance  4) Immunotherapy with Nivolumab 3 MG/KG every 2 weeks. Status post 4 cycles.  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 Completed his radiation treatments on 06/20/15  6. new left inferior frontal tumor radiation on 07/04/15, to a dose of 20 gray in 1 fraction, to be completed Friday 1/6  CURRENT THERAPY: Systemic chemotherapy with docetaxel 75 MG/M2 and Cyramza 10 MG/KG every 3 weeks with Neulasta support. 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    HBP & ASHD> [facility], his notes reviewed, on ECASA81, Imdur30, Metop25/d, Amlod10, Lisin40, Lasix20; BP= 134/60 and he is stable from the CV standpoint..    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    GERD> on Prilosec40    RLS> on Mirapex 68m Qhs...    Anxiety/ insomnia> on Klonopin0.5Bid, Ambien10 prn...    Anemia> Labs by DrMohammed 07/2015 showed Hg= 10-11 range... EXAM reveals Afeb, VSS, O2sat=97% on 2L/min;  HEENT- neg, mallampati1;  Chest- decr BS w/ few bibasilar wheezes and rhonchi;  Heart- RR gr1/6 SEM w/o r/g;  Abd- soft, neg;  Ext- w/o c/c/e...  SPUTUM C&S => grew NTF only IMP/PLAN>>  We had a long discussion regarding his underlying COPD & the need for regular regimen w/ NEBS (duoneb) Qid, Mucinex 12037mBid, & we are prescribing  LEVAQUIN750/d + Depo80+Pred2021md tapering sched (see AVS); in addition we are giving him contingency meds- MMW, nasal saline, etc... He will f/u w/ me in 3wks.   ~  August 27, 2015:  20mo73mo w/ SN>  Last visit we checked Sput C&S=> NTF only & treated him empirically w/ [id number]=> still on 20mg5m he is again using the NEBS only Bid and stopped the Mucinex (says his throat felt better off this med); I reviewed w/ him again the need for an aggressive bronchodilator regimen => O2 at 2-3L/min 24/7, NEBS w/ Duoneb Qid, Mucinex 600Qid w/ fluids; we  discussed weaning the Pred down to 83m/d at this point;  His wife notes that "the chemo has knocked him for a bad loop" (last dose of Taxotere/ Cyramza/ Neulasta was 2/1-08/14/15);  He is also c/o constipation & we discussed Rx w/ Probiotic daily, Miralax 1-2 times daily, Sen[place]ot-S 1-2 Qhs...  EXAM shows Afeb, VSS, O2sat=98% on 1L/min;  HEENT- neg, mallampati1;  Chest- decr BS w/ few bibasilar rhonchi, no wheezing/ rales/ consolidation;  Heart- RR gr1/6 SEM w/o r/g;  Abd- soft, neg;  Ext- tr edema w/o c/c...   LABS 08/2015 per DrMohamed reviewed... IMP/PLAN>>  RMakylehas severe COPD and Stage 4 lung cancer- by all criteria he has done remarkably well on the palliative Chemo/ XRT regimen per DrMohamed;  He is encouraged to use the NEB w/ Duoneb Qid regularly, take the Mucinex-600Qid for the mucus/ congestion, and we will decrease his Pred from 257md=> to 1065m til ret in ~6wks...  ADDENDUM>>  CT Chest/ Abd/ Pelvis 10/10/15>  Norm heart size, atherosclerosis in Ao, great vessels, and coronaries, thickening calcif in AoV, no adenopathy, s/p RULobectomysmall chr right effusion, no new or suspicious pulm nodules or masses, no airspace dis, mild emphysema & bilat apical pleuroparenchymal thickening/ scarring; Abd is neg x granuloma in spleen, & extensive atherosclerosis w/ ectasia of AbdAo=2.7cm before the bifurcation; prob high-grade stenosis of distal left  common femoral art...  ~  December 25, 2015:  6mo26mo w/ SN>  There is a lot to get caught up on since his last visit here-- RogeSolmoneloped left sided weakness & seizures 12/13/15, he was intubated and ADM by CCM, placed on Keppra & Decadron w/ MRI showing interval progression of dis w/ incr size of R parietal & L frontal lobe lesions w/ incr edema (additional smaller lesions are unchanged & no new lesions noted);  He was stabilized, extubated and disch 12/16/15 on Keppra750-2Bid, and Decadron-4mgQ37m  Told to f/u w/ PCP, Oncology, XRT, and Neurology...     He saw DrMohamed 12/17/15>  CURRENT THERAPY: Systemic chemotherapy with docetaxel 75 MG/M2 and Cyramza 10 MG/KG every 3 weeks with Neulasta support. Status post 11 cycles. Starting from cycle #9 the patient was treated with only Cyramza as a single agent. Docetaxel was discontinued secondary to intolerance;  ChemoRx was held & [facility]...     [facility]>  He felt that surgery to remove the growing lesions in the R parietal and L frontal areas was an option- referred to DrCram & consider preoperative XRT as well for best local control; Decadron dose was weaned...     Pt has called DrVaranasi office w/ elev BP & went back to ER 12/22/15 w/ HBP and increased edema in LEs; he had some SOB & "congestion" given IV Solumedrol & NEB rx w/ improvement; DrVaranasi adjusted his BP regimen w/ incr Amlodipine to 5mgBi23madded clonidine0.1mg bi2ms needed to elev BP, but his Lasix was kept at just 20mg/d 72mite increased edema (pt still on Decadron)...    Pt saw DrPenumalli 12/24/15> note reviewed- he continued the [place] discuss the Decadron medication (on 4mgQid s33me Hosp DC 6/6 & DrMoody cut the dose to 4mgTid on21m14 but didn't change the directions on Epic med list).    No notes from DrCram in EPIC but [place] 01/06/16...     Pt presents today w/ this involved Hx and 4+edema/ anasarca, pitting in  legs up to his abd wall  and some pitting in arms as well and weight is up several lbs in just the last few days;  He notes that his breathing is fair- SOB w/ min activ & ADLs, he is weak, notes min cough, small amt yellow sput, no hemoptysis denies f/c/s, no CP but feels "tight";  He is on O2 at 2L/min w/ O2sat=100% at rest;  NEBULIZER w/ Duoneb Qid and MUCINEX Bid;  For his BP & heart he is taking ASA81, Imdur30, Metoprolol25Bid, Amlodipine5Bid, Lisinopril40, Catapress0.1 prn, Lasix20/d... EXAM shows Afeb, VSS w/ BP=150/70, Wt=185#, O2sat=100% on 2L at rest;  HEENT- neg, mallampati1;  Chest- sl decr BS on right, few scat rhonchi, no wheezing or consolidation;  Heart- RR gr1/6 SEM w/o r/g;  Abd- soft, neg;  Ext- 4+edema.  LABS from 12/2015 reviewed in Epic>  Chems- Na=133, K=4.6, BUN=20, Cr=0.8, alb=3.0;  CBC- Hg=12.2, WBC=7.0  CXR 12/22/15>  Post op changes in right lung w/ vol loss, no focal airspace dis or consolidation, NAD... IMP/PLAN>>  Pt has anasarca on large Decadron dose since 6/3 hosp for seizure + not restricting sodium and taking Amlod10/d;  Rec to get on a sodium restricted diet, wean the Amlod to 81m/d, wean the Decadron faster (?who's in charge), and crank up the Lasix as able- currently on 274md=> incr to 4042mm w/ ROV & labs in 1wk...   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 (the 6/15 MRI is the special protocol planning scan that we ordered for treatment and surgical navigation- This scan noted a decrease in the size  of this lesion due to the pt being on steroids. The other lesions mentioned are previously treated lesions that need no action at this time. We will continue to scan every three months to follow-up on the previously treated lesions and keep an eye out for new areas that may present themselves requiring an additional course of SRS). 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  ~  January 02, 2016:  1wk ROV & post hosp visit>  I saw RogeAladdint week w/ ANASARCA (likely multifactorial due to sodium intake, hi dose Decadron therapy, Amlodipine10mg38mlow serum Albumin, etc);  We adjusted his BP regimen by decreasing the Amlod, increasing the Clonidine & his Lasix (to 40mg 21m;  He went to the ER 12/30/15 w/ incr SOB & was ADM to [facility] for a COPD exac- treated w/ O2, NEBS, IV Solumedrol, antibiotics and noted to have rapid improvement therefore disch from Observ on O2, Duoneb Qid, Decadron4mgTid92mithromax250/d, GFN400Bid, Amlodipine5, Clonidine0.1Bid, Lisinopril40, Metoprolol25Bid, Lasix40Qam...    He returns today looking weaker & wife is concerned about some behavioral changes, they are hopeful that removal of the one tumor met  in the right parietal area planned by DrCram will help this prob- wife notes that they have not yet talked w/ DrCram;  He has persistent anasarca w/ 3-4+ edema in legs, thighs, abd wall and his lower arms- weight is 186# (up 2# further despite increased Lasix for 1wk); looks like they did not take advantage of his in-patient status to diurese him w/ IV Lasix which really would have made a difference; he also notes increased thick brown  sputum production now despite the Zithromax & GFN orally...    EXAM shows Afeb, VSS, O2sat=98% on 2L/min O2;  Wt=186#;  HEENT- neg, mallampati2;  Chest- sl decr BS on right, few scat rhonchi, no wheezing or consolidation;  Heart- RR gr1/6 SEM w/o r/g;  Abd- soft, neg;  Ext- 4+edema.LEs/ abd/arms...  2DEcho 12/30/15 showed mod focal basal hypertrophy of the septum, norm LVF w/ EF=55-60%, no regional wall motion abn, norm LV diastolic parameters, no signif valvular abn, norm RV...  CXR 6/20 showed norm heart size, COPD/emphysema, small rt effusion & basilar atx, s/p RULobectomy  LABS 12/2015>  ABGs showed hypercarbia w/ pCO2=58-60;  Chems showed hyponatremia w/ Na=128-133, norm renal function;  CBC showed Hg=11-12;  BNP=265  Bilat LE Venous Dopplers 01/02/16> NEG for DVT... IMP>>      1) COPD (GOLD Stage3-4), quit smoking 09/2014> on O2 at 2-3L/min, NEBS w/ Duoneb Qid, Decadron 34mTid for brain mets per DrMoody, Guaifenesin400-2Tid, ZPak=> LEVAQUIN 5053md now...    2)  Chronic hypercarbic & hypoxemic respiratory failure>     3) Stage 4 squamous cell lung cancer w/ Hx widespread pulm, nodal, adrenal, brain mets, & ?renal metastatic lesion> Rx per DrMohamed & MoLisbeth Renshawas controlled the dis apparent in the chest & abd- now w/ brain mets...Marland KitchenMarland Kitchen  4)  Cardiac issues> HBP, ASHD, ASPVD, Anasarca> followed by DrVaranasi, his notes reviewed, on ECASA81, Imdur30, Metop25Bid, Amlod5, Lisin40, Clonidine0.76m22md, Lasix up to 40Bid now PLAN>>      RogStiless severe underlying dis and an ASA 3 surgical risk; we had a long talk today about his condition and I reminded him of his stage4 lung cancer & the palliative nature of all treatments for this stage disease;  I think that since he has done so very well w/ his prev surg, chemoRx, and XRT over the last 3 yrs that he is NOT wanting to give up and he is anxious to get the metastatic lesion treated in whatever way is poss;  I would respectfully suggest CCM consult for  perioperative management, aid in diuresis, and nutritional support;  I suspect that he will need post-op rehab (?in-pt or NHP) as his wife will have difficulty handling him if he cannot fend for himself...     Past Medical History  Diagnosis Date  . Arthritis   . Wheezing   . Sore throat   . Carotid artery occlusion   . COPD (chronic obstructive pulmonary disease) ([place] . Lumbar disc disease   . Restless leg syndrome   . Allergic rhinitis   . Hypertension   . Anxiety     anxiety  . Shortness of breath   . Lung mass   . GERD (gastroesophageal reflux disease)   . H/O hiatal hernia   . Pre-operative cardiovascular examination   . Nonspecific abnormal unspecified cardiovascular function study   . Peripheral vascular disease, unspecified (HCCS[place] . Pneumonia 2014    ?   . OMarland Kitchen home oxygen therapy     prn  .  Constipation   . 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 S/P radiation therapy 12/04/14 srs    lt frontal brain  . S/P radiation therapy 07/18/15    SRS left frontal brain 20Gy  . Seizures ([place]) 12/13/15  . Cancer ([place]) 09/14/12    invasive mod diff squamous cell ca  . Metastasis to brain ([place]) 02/2015  . History of chemotherapy   . Headache   . Occasional tremors     feet and legs     Past Surgical History  Procedure Laterality Date  . Carotid endarterectomy  10/15/2010    left  . Hand surgery      repair of the left long, ring, and small finger after a saw injury  . Video bronchoscopy  07/24/2012    Procedure: VIDEO BRONCHOSCOPY WITH FLUORO;  Surgeon: Kathee Delton, MD;  Location: [facility];  Service: Cardiopulmonary;  Laterality: Bilateral;  . Lobectomy Right 09/14/2012    Procedure: LOBECTOMY;  Surgeon: Ivin Poot, MD;  Location: [place];  Service: Thoracic;  Laterality: Right;  RIGHT UPPER LOBECTOMY  . Video assisted thoracoscopy Right 09/14/2012    Procedure: VIDEO ASSISTED THORACOSCOPY;  Surgeon: Ivin Poot, MD;  Location: [facility];  Service:  Thoracic;  Laterality: Right;  . Video bronchoscopy with endobronchial ultrasound N/A 08/27/2014    Procedure: VIDEO BRONCHOSCOPY WITH ENDOBRONCHIAL ULTRASOUND;  Surgeon: Ivin Poot, MD;  Location: [place];  Service: Thoracic;  Laterality: N/A;  . Scalene node biopsy Right 08/27/2014    Procedure: BIOPSY SCALENE NODE;  Surgeon: Ivin Poot, MD;  Location: [place];  Service: Thoracic;  Laterality: Right;  . Sterotactic radiosurgery Right 09/25/14    right parietal 30Gy/1 fx  . Power port a cath Right   . Colonoscopy      Outpatient Encounter Prescriptions as of 01/02/2016  Medication Sig  . amLODipine (NORVASC) 5 MG tablet Take 5 mg by mouth daily.  Marland Kitchen aspirin EC 81 MG tablet Take 81 mg by mouth daily. Reported on 12/22/2015  . azithromycin (ZITHROMAX) 250 MG tablet Take 1 tablet (250 mg total) by mouth daily.  . clonazePAM (KLONOPIN) 0.5 MG tablet Take 0.5 mg by mouth 2 (two) times daily as needed for anxiety. Takes one tablet by mouth two times daily as needed for anxiety.  . cloNIDine (CATAPRES) 0.1 MG tablet Take 0.1 mg by mouth 2 (two) times daily.  Marland Kitchen dexamethasone (DECADRON) 4 MG tablet Take 4 mg by mouth 4 (four) times daily.   . furosemide (LASIX) 20 MG tablet Take 2 tablets (40 mg total) by mouth daily.  Marland Kitchen guaifenesin (HUMIBID E) 400 MG TABS tablet Take 400 mg by mouth 2 (two) times daily.  Marland Kitchen HYDROcodone-acetaminophen (NORCO) 7.5-325 MG tablet Take 1 tablet by mouth every 6 (six) hours as needed for moderate pain.  Marland Kitchen ipratropium-albuterol (DUONEB) 0.5-2.5 (3) MG/3ML SOLN Take 3 mLs by nebulization 4 (four) times daily. (Patient taking differently: Take 3 mLs by nebulization 4 (four) times daily. Pt takes nebulizer 2 to 3 times daily.)  . isosorbide mononitrate (IMDUR) 30 MG 24 hr tablet Take 1 tablet (30 mg total) by mouth daily.  Marland Kitchen levETIRAcetam (KEPPRA) 750 MG tablet Take 2 tablets (1,500 mg total) by mouth 2 (two) times daily.  Marland Kitchen levofloxacin (LEVAQUIN) 500 MG tablet Take 500 mg by  mouth daily.  Marland Kitchen lisinopril (PRINIVIL,ZESTRIL) 40 MG tablet Take 40 mg by mouth at bedtime.   . metoprolol tartrate (LOPRESSOR) 25 MG  tablet Take 1 tablet (25 mg total) by mouth 2 (two) times daily. For systolic BP of 588 or greater take 50 mg BID (Patient taking differently: Take 50 mg by mouth 2 (two) times daily. For systolic BP of 502 or greater take 50 mg BID)  . omeprazole (PRILOSEC) 40 MG capsule Take 40 mg by mouth daily.  . OXYGEN Inhale 2-3 L into the lungs continuous.  . pramipexole (MIRAPEX) 1 MG tablet Take 1 mg by mouth at bedtime.  . sodium chloride (OCEAN) 0.65 % SOLN nasal spray Place 1 spray into both nostrils as needed for congestion.  . traMADol (ULTRAM) 50 MG tablet Take 1 tablet (50 mg total) by mouth every 12 (twelve) hours as needed. (Patient taking differently: Take 50 mg by mouth daily as needed (pain). )  . triamcinolone cream (KENALOG) 0.1 % Apply 1 application topically 2 (two) times daily as needed (itching/ dry skin).   Marland Kitchen zolpidem (AMBIEN) 10 MG tablet Take 10 mg by mouth at bedtime as needed for sleep. Reported on 09/25/2015  . furosemide (LASIX) 40 MG tablet Take 1 tablet (40 mg total) by mouth 2 (two) times daily.  Marland Kitchen levofloxacin (LEVAQUIN) 500 MG tablet Take 1 tablet (500 mg total) by mouth daily.   Facility-Administered Encounter Medications as of 01/02/2016  Medication  . sodium chloride 0.9 % injection 10 mL    No Known Allergies   Current Medications, Allergies, Past Medical History, Past Surgical History, Family History, and Social History were reviewed in [employer] record.   Review of Systems  Constitutional: Positive for fatigue. Negative for fever and unexpected weight change.  HENT: Positive for congestion. Negative for dental problem, ear pain, nosebleeds, postnasal drip, rhinorrhea, sinus pressure, sneezing, sore throat and trouble swallowing.   Eyes: Negative for redness and itching.  Respiratory: Positive for cough,  chest tightness and shortness of breath.   Cardiovascular: Positive for leg swelling. Negative for palpitations.  Gastrointestinal: Negative for nausea and vomiting.  Genitourinary: Negative for dysuria.  Musculoskeletal: Negative for joint swelling.  Skin: Negative for rash.  Neurological: Positive for dizziness, seizures, weakness and headaches. Negative for syncope.  Hematological: Does not bruise/bleed easily.  Psychiatric/Behavioral: Positive for sleep disturbance and dysphoric mood. The patient is nervous/anxious.       Objective:   Physical Exam  Vital Signs:  Reviewed...  General:  WD, WN, 75 y/o WM chr ill appearing, in NAD; alert & oriented; pleasant & cooperative... HEENT:  [place]/AT; Conjunctiva- pink, Sclera- nonicteric, EOM-wnl, PERRLA, EACs-clear, TMs-wnl; NOSE-clear; THROAT-clear & wnl. Neck:  Supple w/ fair ROM; no JVD; s/p LCAE, faint R bruit; no thyromegaly or nodules palpated; + right supraclav lymphadenopathy. Chest:  Decr BS w/ few rhonchi & wheezes w/o consolidation Heart:  Regular Rhythm; Gr1/6 SEM, w/o rubs or gallops detected. Abdomen:  Soft & nontender- no guarding or rebound; normal bowel sounds; no organomegaly or masses palpated. Ext:  decr ROM; without deformities, mild arthritic changes; no varicose veins, +venous insuffic, 4+edema/ anasarca Neuro:  No focal neuro deficits, gait normal & balance OK. Derm:  No lesions noted; no rash etc. 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 07/31/15> Rx w/ [id number], Depo80+Pred taper; checked sput C&S=> NTF only... 08/27/15> He is improved after Levaquin/ Pred taper; now reminded to use the NEBS Qid, Mucinex Qid, &  cut the Pred to 49m Qam til rov... 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 He has mult brain mets [facility] NS- DrCram to consider resection w/ additional XRT => planned for 01/05/16  HBP & ASHD> followed by DrVaranasi, his notes reviewed, on ECASA81, Imdur30, Metop25/d, Amlod10, Lisin40, Lasix20; BP= 150/70 and he knows that he can take an extra Metop25 daily if needed for BP... 12/25/15>  He developed seizures, placed on Keppra & Decadron, and has developed anasarca due to the Decadron476mid, Amlod10, not restricting sodium=> we decr the Amlod to 28m51masked him to elim the salt, DrMoody cut the decadron, and he will increase the Lasix to 67m61m+ Clonidine 0.1mgb13mfor his BP... 01/02/16>  Then edema has not budged since starting the Lasix=> rec to double it to 67mg 21m I suspect he will need IV diuresis in the hosp...  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  GERD> on Prilosec40  RLS> on Mirapex 1mg Qh6m.  Anxiety/ insomnia> on Klonopin0.5Bid, Ambien10 prn...  Anemia> Labs by DrMohammed reviewed...   Patient's Medications  New Prescriptions   FUROSEMIDE (LASIX) 40 MG TABLET    Take 1 tablet (40 mg total) by mouth 2 (two) times daily.   LEVOFLOXACIN (LEVAQUIN) 500 MG TABLET    Take 1 tablet (500 mg total) by mouth daily.  Previous Medications   AMLODIPINE (NORVASC) 5 MG TABLET    Take 5 mg by mouth daily.   ASPIRIN EC 81 MG TABLET    Take 81 mg by mouth daily. Reported on 12/22/2015   AZITHROMYCIN (ZITHROMAX) 250 MG TABLET    Take 1 tablet (250 mg total) by mouth daily.   CLONAZEPAM (KLONOPIN) 0.5 MG TABLET     Take 0.5 mg by mouth 2 (two) times daily as needed for anxiety. Takes one tablet by mouth two times daily as needed for anxiety.   CLONIDINE (CATAPRES) 0.1 MG TABLET    Take 0.1 mg by mouth 2 (two) times daily.   DEXAMETHASONE (DECADRON) 4 MG TABLET    Take 4 mg by mouth 4 (four) times daily.    FUROSEMIDE (LASIX) 20 MG TABLET    Take 2 tablets (40 mg total) by mouth daily.   GUAIFENESIN (HUMIBID E) 400 MG TABS TABLET    Take 400 mg by mouth 2 (two) times daily.   HYDROCODONE-ACETAMINOPHEN (NORCO) 7.5-325 MG TABLET    Take 1 tablet by mouth every 6 (six) hours as needed for moderate pain.   IPRATROPIUM-ALBUTEROL (DUONEB) 0.5-2.5 (3) MG/3ML SOLN    Take 3 mLs by nebulization 4 (four) times daily.   ISOSORBIDE MONONITRATE (IMDUR) 30 MG 24 HR TABLET    Take 1 tablet (30 mg total) by mouth daily.   LEVETIRACETAM (KEPPRA) 750 MG TABLET    Take 2 tablets (1,500 mg total) by mouth 2 (two) times daily.   LEVOFLOXACIN (LEVAQUIN) 500 MG TABLET    Take 500 mg by mouth daily.   LISINOPRIL (PRINIVIL,ZESTRIL) 40 MG TABLET  Take 40 mg by mouth at bedtime.    METOPROLOL TARTRATE (LOPRESSOR) 25 MG TABLET    Take 1 tablet (25 mg total) by mouth 2 (two) times daily. For systolic BP of 338 or greater take 50 mg BID   OMEPRAZOLE (PRILOSEC) 40 MG CAPSULE    Take 40 mg by mouth daily.   OXYGEN    Inhale 2-3 L into the lungs continuous.   PRAMIPEXOLE (MIRAPEX) 1 MG TABLET    Take 1 mg by mouth at bedtime.   SODIUM CHLORIDE (OCEAN) 0.65 % SOLN NASAL SPRAY    Place 1 spray into both nostrils as needed for congestion.   TRAMADOL (ULTRAM) 50 MG TABLET    Take 1 tablet (50 mg total) by mouth every 12 (twelve) hours as needed.   TRIAMCINOLONE CREAM (KENALOG) 0.1 %    Apply 1 application topically 2 (two) times daily as needed (itching/ dry skin).    ZOLPIDEM (AMBIEN) 10 MG TABLET    Take 10 mg by mouth at bedtime as needed for sleep. Reported on 09/25/2015  Modified Medications   No medications on file  Discontinued  Medications   No medications on file

## 2016-01-02 NOTE — Telephone Encounter (Signed)
Doppler study negative for DVT bilateral. FYI to Dr. Lenna Gilford. Please advise.

## 2016-01-04 LAB — CULTURE, BLOOD (ROUTINE X 2)
CULTURE: NO GROWTH
CULTURE: NO GROWTH

## 2016-01-05 ENCOUNTER — Ambulatory Visit
Admission: RE | Admit: 2016-01-05 | Discharge: 2016-01-05 | Disposition: A | Discharge status: Home / Self Care | Payer: Medicare Other | Source: Ambulatory Visit | Attending: Radiation Oncology | Admitting: Radiation Oncology

## 2016-01-05 ENCOUNTER — Encounter: Payer: Self-pay | Admitting: Radiation Oncology

## 2016-01-05 ENCOUNTER — Other Ambulatory Visit: Payer: Medicare Other

## 2016-01-05 VITALS — BP 168/81 | HR 59 | Temp 97.8°F | Resp 20

## 2016-01-05 DIAGNOSIS — C7931 Secondary malignant neoplasm of brain: Secondary | ICD-10-CM

## 2016-01-05 NOTE — Anesthesia Preprocedure Evaluation (Addendum)
Anesthesia Evaluation  Patient identified by MRN, date of birth, ID band Patient awake    Reviewed: Allergy & Precautions, NPO status , Patient's Chart, lab work & pertinent test results, reviewed documented beta blocker date and time   Airway Mallampati: II  TM Distance: >3 FB Neck ROM: Full    Dental  (+) Edentulous Upper, Edentulous Lower   Pulmonary shortness of breath, COPD,  COPD inhaler and oxygen dependent, former smoker (quit 2016),  pulm consult suggests post op ventilation   + rhonchi        Cardiovascular hypertension, Pt. on medications and Pt. on home beta blockers (-) angina+ CAD (RCA disease: medical management) and + Peripheral Vascular Disease (R subclavian stenosis)   Rhythm:Regular Rate:Normal  6/17 ECHO: EF 55-60%, normal wall motion, valves OK   Neuro/Psych  Headaches, Seizures -,  Anxiety Brain mets    GI/Hepatic Neg liver ROS, hiatal hernia, GERD  Medicated and Controlled,  Endo/Other  negative endocrine ROS  Renal/GU negative Renal ROS     Musculoskeletal  (+) Arthritis , Osteoarthritis,    Abdominal   Peds  Hematology  (+) Blood dyscrasia (Hb 10.9), ,   Anesthesia Other Findings   Reproductive/Obstetrics                          Anesthesia Physical Anesthesia Plan  ASA: IV  Anesthesia Plan: General   Post-op Pain Management:    Induction: Intravenous  Airway Management Planned: Oral ETT  Additional Equipment: Arterial line  Intra-op Plan:   Post-operative Plan: Possible Post-op intubation/ventilation  Informed Consent: I have reviewed the patients History and Physical, chart, labs and discussed the procedure including the risks, benefits and alternatives for the proposed anesthesia with the patient or authorized representative who has indicated his/her understanding and acceptance.     Plan Discussed with:   Anesthesia Plan Comments: (See my  anesthesia note. High risk for pulmonary standpoint. See my addendum with recommendations from Dr. Leeanne Deed.  Myra Gianotti, PA-C Plan routine monitors, A line, GETA with probable post op ventilation)       Anesthesia Quick Evaluation

## 2016-01-05 NOTE — Progress Notes (Signed)
  Radiation Oncology         [phone]) [phone] ________________________________  Name: [name] MRN: [id number]  Date: 01/05/2016  DOB: September 07, 1940   SPECIAL TREATMENT PROCEDURE   3D TREATMENT PLANNING AND DOSIMETRY: [name] radiation plan was reviewed and approved by Dr. Saintclair Halsted from neurosurgery and radiation oncology prior to treatment. It showed 3-dimensional radiation distributions overlaid onto the planning CT/MRI image set. The [facility] for the target structures as well as the organs at risk were reviewed. The documentation of the 3D plan and dosimetry are filed in the radiation oncology EMR.   NARRATIVE: The patient was brought to the TrueBeam stereotactic radiation treatment machine and placed supine on the CT couch. The head frame was applied, and the patient was set up for stereotactic radiosurgery. Neurosurgery was present for the set-up and delivery   SIMULATION VERIFICATION: In the couch zero-angle position, the patient underwent Exactrac imaging using the Brainlab system with orthogonal KV images. These were carefully aligned and repeated to confirm treatment position for each of the isocenters. The Exactrac snap film verification was repeated at each couch angle.   SPECIAL TREATMENT PROCEDURE: The patient received stereotactic radiosurgery to the following target:  PTV7 Right parietal lobe target was treated using 6 Arcs to a prescription dose of 14 Gy. ExacTrac Snap verification was performed for each couch angle.   STEREOTACTIC TREATMENT MANAGEMENT: Following delivery, the patient was transported to nursing in stable condition and monitored for possible acute effects. Vital signs were recorded . The patient tolerated treatment without significant acute effects, and was discharged to home in stable condition.  PLAN: Follow-up in one month.   ------------------------------------------------  Jodelle Gross, MD, PhD

## 2016-01-05 NOTE — Progress Notes (Signed)
S/p SRS brain, x 1, c/o blurred vision, coughing congestive productive cough whitish saliva, no pain, no c/o nausea, hoarseness, weakness and fatigue, on 2 liters n/c  Oxygen in w/c, wife at side, monitor 30 minutes, offered warm blanket, ,declined tv and fluids  , will d/c home via w/c no driving today,  Rest when gets home, call for any unusual symptoms not normal for you , any fever 100.5>, verbal understanding 2:26 PM

## 2016-01-05 NOTE — Procedures (Signed)
  Name: [name]  MRN: [id number]  Date: 01/05/2016   DOB: Feb 28, 1941  Stereotactic Radiosurgery Operative Note  PRE-OPERATIVE DIAGNOSIS:  Solitary Brain Metastasis  POST-OPERATIVE DIAGNOSIS:  Solitary Brain Metastasis  PROCEDURE:  Stereotactic Radiosurgery  SURGEON:  Undray Allman P, MD  NARRATIVE: The patient underwent a radiation treatment planning session in the radiation oncology simulation suite under the care of the radiation oncology physician and physicist.  I participated closely in the radiation treatment planning afterwards. The patient underwent planning CT which was fused to 3T high resolution MRI with 1 mm axial slices.  These images were fused on the planning system.  We contoured the gross target volumes and subsequently expanded this to yield the Planning Target Volume. I actively participated in the planning process.  I helped to define and review the target contours and also the contours of the optic pathway, eyes, brainstem and selected nearby organs at risk.  All the dose constraints for critical structures were reviewed and compared to AAPM Task Group 101.  The prescription dose conformity was reviewed.  I approved the plan electronically.    Accordingly, [name] was brought to the TrueBeam stereotactic radiation treatment linac and placed in the custom immobilization mask.  The patient was aligned according to the IR fiducial markers with BrainLab Exactrac, then orthogonal x-rays were used in ExacTrac with the 6DOF robotic table and the shifts were made to align the patient  [name] received stereotactic radiosurgery uneventfully.    The detailed description of the procedure is recorded in the radiation oncology procedure note.  I was present for the duration of the procedure.  DISPOSITION:  Following delivery, the patient was transported to nursing in stable condition and monitored for possible acute effects to be discharged to home in stable condition  with follow-up in one month.  [name] P, MD 01/05/2016 5:33 PM

## 2016-01-06 ENCOUNTER — Inpatient Hospital Stay (HOSPITAL_COMMUNITY): Payer: Medicare Other | Admitting: Anesthesiology

## 2016-01-06 ENCOUNTER — Encounter (HOSPITAL_COMMUNITY)
Admission: RE | Disposition: A | Discharge status: Skilled Nursing Facility | Payer: Self-pay | Source: Ambulatory Visit | Attending: Neurosurgery

## 2016-01-06 ENCOUNTER — Inpatient Hospital Stay (HOSPITAL_COMMUNITY): Payer: Medicare Other | Admitting: Vascular Surgery

## 2016-01-06 ENCOUNTER — Inpatient Hospital Stay (HOSPITAL_COMMUNITY)
Admission: RE | Admit: 2016-01-06 | Discharge: 2016-01-16 | DRG: 025 | Disposition: A | Discharge status: Skilled Nursing Facility | Payer: Medicare Other | Source: Ambulatory Visit | Attending: Neurosurgery | Admitting: Neurosurgery

## 2016-01-06 ENCOUNTER — Encounter (HOSPITAL_COMMUNITY): Payer: Self-pay | Admitting: Certified Registered"

## 2016-01-06 DIAGNOSIS — G2581 Restless legs syndrome: Secondary | ICD-10-CM | POA: Diagnosis present

## 2016-01-06 DIAGNOSIS — R569 Unspecified convulsions: Secondary | ICD-10-CM | POA: Diagnosis present

## 2016-01-06 DIAGNOSIS — J9610 Chronic respiratory failure, unspecified whether with hypoxia or hypercapnia: Secondary | ICD-10-CM

## 2016-01-06 DIAGNOSIS — C3491 Malignant neoplasm of unspecified part of right bronchus or lung: Secondary | ICD-10-CM | POA: Diagnosis present

## 2016-01-06 DIAGNOSIS — Z7189 Other specified counseling: Secondary | ICD-10-CM | POA: Diagnosis not present

## 2016-01-06 DIAGNOSIS — G8194 Hemiplegia, unspecified affecting left nondominant side: Secondary | ICD-10-CM | POA: Diagnosis present

## 2016-01-06 DIAGNOSIS — Z9889 Other specified postprocedural states: Secondary | ICD-10-CM | POA: Diagnosis not present

## 2016-01-06 DIAGNOSIS — E1151 Type 2 diabetes mellitus with diabetic peripheral angiopathy without gangrene: Secondary | ICD-10-CM | POA: Diagnosis present

## 2016-01-06 DIAGNOSIS — R414 Neurologic neglect syndrome: Secondary | ICD-10-CM | POA: Diagnosis present

## 2016-01-06 DIAGNOSIS — Z7982 Long term (current) use of aspirin: Secondary | ICD-10-CM

## 2016-01-06 DIAGNOSIS — E8809 Other disorders of plasma-protein metabolism, not elsewhere classified: Secondary | ICD-10-CM | POA: Diagnosis present

## 2016-01-06 DIAGNOSIS — Z7952 Long term (current) use of systemic steroids: Secondary | ICD-10-CM | POA: Diagnosis not present

## 2016-01-06 DIAGNOSIS — D696 Thrombocytopenia, unspecified: Secondary | ICD-10-CM | POA: Diagnosis present

## 2016-01-06 DIAGNOSIS — W06XXXA Fall from bed, initial encounter: Secondary | ICD-10-CM | POA: Diagnosis not present

## 2016-01-06 DIAGNOSIS — Y92230 Patient room in hospital as the place of occurrence of the external cause: Secondary | ICD-10-CM | POA: Diagnosis not present

## 2016-01-06 DIAGNOSIS — E440 Moderate protein-calorie malnutrition: Secondary | ICD-10-CM | POA: Diagnosis present

## 2016-01-06 DIAGNOSIS — R482 Apraxia: Secondary | ICD-10-CM | POA: Diagnosis present

## 2016-01-06 DIAGNOSIS — Z515 Encounter for palliative care: Secondary | ICD-10-CM | POA: Insufficient documentation

## 2016-01-06 DIAGNOSIS — J9620 Acute and chronic respiratory failure, unspecified whether with hypoxia or hypercapnia: Secondary | ICD-10-CM

## 2016-01-06 DIAGNOSIS — Z87891 Personal history of nicotine dependence: Secondary | ICD-10-CM

## 2016-01-06 DIAGNOSIS — Z79899 Other long term (current) drug therapy: Secondary | ICD-10-CM | POA: Diagnosis not present

## 2016-01-06 DIAGNOSIS — E1165 Type 2 diabetes mellitus with hyperglycemia: Secondary | ICD-10-CM | POA: Diagnosis present

## 2016-01-06 DIAGNOSIS — E871 Hypo-osmolality and hyponatremia: Secondary | ICD-10-CM | POA: Diagnosis present

## 2016-01-06 DIAGNOSIS — F419 Anxiety disorder, unspecified: Secondary | ICD-10-CM | POA: Diagnosis present

## 2016-01-06 DIAGNOSIS — R410 Disorientation, unspecified: Secondary | ICD-10-CM | POA: Diagnosis not present

## 2016-01-06 DIAGNOSIS — H5340 Unspecified visual field defects: Secondary | ICD-10-CM | POA: Diagnosis present

## 2016-01-06 DIAGNOSIS — J449 Chronic obstructive pulmonary disease, unspecified: Secondary | ICD-10-CM | POA: Diagnosis present

## 2016-01-06 DIAGNOSIS — J9611 Chronic respiratory failure with hypoxia: Secondary | ICD-10-CM | POA: Diagnosis not present

## 2016-01-06 DIAGNOSIS — I2510 Atherosclerotic heart disease of native coronary artery without angina pectoris: Secondary | ICD-10-CM | POA: Diagnosis present

## 2016-01-06 DIAGNOSIS — J9621 Acute and chronic respiratory failure with hypoxia: Secondary | ICD-10-CM | POA: Diagnosis not present

## 2016-01-06 DIAGNOSIS — G936 Cerebral edema: Secondary | ICD-10-CM | POA: Diagnosis present

## 2016-01-06 DIAGNOSIS — C7931 Secondary malignant neoplasm of brain: Principal | ICD-10-CM | POA: Diagnosis present

## 2016-01-06 DIAGNOSIS — I110 Hypertensive heart disease with heart failure: Secondary | ICD-10-CM | POA: Diagnosis present

## 2016-01-06 DIAGNOSIS — T380X5A Adverse effect of glucocorticoids and synthetic analogues, initial encounter: Secondary | ICD-10-CM | POA: Diagnosis present

## 2016-01-06 DIAGNOSIS — R600 Localized edema: Secondary | ICD-10-CM | POA: Diagnosis not present

## 2016-01-06 DIAGNOSIS — Z6824 Body mass index (BMI) 24.0-24.9, adult: Secondary | ICD-10-CM

## 2016-01-06 DIAGNOSIS — R531 Weakness: Secondary | ICD-10-CM

## 2016-01-06 DIAGNOSIS — K5900 Constipation, unspecified: Secondary | ICD-10-CM | POA: Diagnosis present

## 2016-01-06 DIAGNOSIS — I619 Nontraumatic intracerebral hemorrhage, unspecified: Secondary | ICD-10-CM

## 2016-01-06 DIAGNOSIS — R112 Nausea with vomiting, unspecified: Secondary | ICD-10-CM | POA: Diagnosis not present

## 2016-01-06 DIAGNOSIS — I5032 Chronic diastolic (congestive) heart failure: Secondary | ICD-10-CM | POA: Diagnosis present

## 2016-01-06 DIAGNOSIS — C3490 Malignant neoplasm of unspecified part of unspecified bronchus or lung: Secondary | ICD-10-CM | POA: Diagnosis not present

## 2016-01-06 DIAGNOSIS — D638 Anemia in other chronic diseases classified elsewhere: Secondary | ICD-10-CM | POA: Diagnosis present

## 2016-01-06 DIAGNOSIS — K567 Ileus, unspecified: Secondary | ICD-10-CM

## 2016-01-06 DIAGNOSIS — Z9981 Dependence on supplemental oxygen: Secondary | ICD-10-CM | POA: Diagnosis not present

## 2016-01-06 DIAGNOSIS — I10 Essential (primary) hypertension: Secondary | ICD-10-CM | POA: Diagnosis not present

## 2016-01-06 HISTORY — PX: CRANIOTOMY: SHX93

## 2016-01-06 LAB — POCT I-STAT 7, (LYTES, BLD GAS, ICA,H+H)
ACID-BASE EXCESS: 11 mmol/L — AB (ref 0.0–2.0)
Acid-Base Excess: 12 mmol/L — ABNORMAL HIGH (ref 0.0–2.0)
BICARBONATE: 39 meq/L — AB (ref 20.0–24.0)
BICARBONATE: 36 meq/L — AB (ref 20.0–24.0)
CALCIUM ION: 1.16 mmol/L (ref 1.12–1.23)
Calcium, Ion: 1.01 mmol/L — ABNORMAL LOW (ref 1.12–1.23)
HCT: 34 % — ABNORMAL LOW (ref 39.0–52.0)
HCT: 26 % — ABNORMAL LOW (ref 39.0–52.0)
Hemoglobin: 11.6 g/dL — ABNORMAL LOW (ref 13.0–17.0)
Hemoglobin: 8.8 g/dL — ABNORMAL LOW (ref 13.0–17.0)
O2 SAT: 98 %
O2 SAT: 100 %
PCO2 ART: 57.7 mmHg — AB (ref 35.0–45.0)
PH ART: 7.433 (ref 7.350–7.450)
PH ART: 7.492 — AB (ref 7.350–7.450)
PO2 ART: 101 mmHg — AB (ref 80.0–100.0)
PO2 ART: 176 mmHg — AB (ref 80.0–100.0)
Patient temperature: 36.1
Potassium: 4.2 mmol/L (ref 3.5–5.1)
Potassium: 4.4 mmol/L (ref 3.5–5.1)
SODIUM: 129 mmol/L — AB (ref 135–145)
Sodium: 130 mmol/L — ABNORMAL LOW (ref 135–145)
TCO2: 41 mmol/L (ref 0–100)
TCO2: 38 mmol/L (ref 0–100)
pCO2 arterial: 45.7 mmHg — ABNORMAL HIGH (ref 35.0–45.0)

## 2016-01-06 LAB — BASIC METABOLIC PANEL
ANION GAP: 6 (ref 5–15)
BUN: 19 mg/dL (ref 6–20)
CALCIUM: 7.4 mg/dL — AB (ref 8.9–10.3)
CO2: 32 mmol/L (ref 22–32)
CREATININE: 0.83 mg/dL (ref 0.61–1.24)
Chloride: 93 mmol/L — ABNORMAL LOW (ref 101–111)
GFR calc Af Amer: >60 mL/min (ref >60)
GLUCOSE: 119 mg/dL — AB (ref 65–99)
Potassium: 4.5 mmol/L (ref 3.5–5.1)
Sodium: 131 mmol/L — ABNORMAL LOW (ref 135–145)

## 2016-01-06 LAB — PREPARE RBC (CROSSMATCH)

## 2016-01-06 LAB — CBC
HCT: 31.5 % — ABNORMAL LOW (ref 39.0–52.0)
HEMOGLOBIN: 10.1 g/dL — AB (ref 13.0–17.0)
MCH: 29.8 pg (ref 26.0–34.0)
MCHC: 32.1 g/dL (ref 30.0–36.0)
MCV: 92.9 fL (ref 78.0–100.0)
PLATELETS: 122 10*3/uL — AB (ref 150–400)
RBC: 3.39 MIL/uL — ABNORMAL LOW (ref 4.22–5.81)
RDW: 14.7 % (ref 11.5–15.5)
WBC: 5.8 10*3/uL (ref 4.0–10.5)

## 2016-01-06 LAB — GLUCOSE, CAPILLARY
GLUCOSE-CAPILLARY: 108 mg/dL — AB (ref 65–99)
Glucose-Capillary: 92 mg/dL (ref 65–99)

## 2016-01-06 SURGERY — CRANIOTOMY TUMOR EXCISION
Anesthesia: General

## 2016-01-06 MED ORDER — DEXAMETHASONE SODIUM PHOSPHATE 10 MG/ML IJ SOLN
INTRAMUSCULAR | Status: AC
  Filled 2016-01-06: qty 1

## 2016-01-06 MED ORDER — PROPOFOL 10 MG/ML IV BOLUS
INTRAVENOUS | Status: AC
  Filled 2016-01-06: qty 20

## 2016-01-06 MED ORDER — DEXAMETHASONE SODIUM PHOSPHATE 4 MG/ML IJ SOLN
4.0000 mg | Freq: Four times a day (QID) | INTRAMUSCULAR | Status: AC
  Administered 2016-01-07 – 2016-01-08 (×4): 4 mg via INTRAVENOUS
  Filled 2016-01-06 (×4): qty 1

## 2016-01-06 MED ORDER — SODIUM CHLORIDE 0.9 % IV SOLN
500.0000 mg | Freq: Two times a day (BID) | INTRAVENOUS | Status: DC
  Administered 2016-01-06 – 2016-01-08 (×4): 500 mg via INTRAVENOUS
  Filled 2016-01-06 (×6): qty 5

## 2016-01-06 MED ORDER — PANTOPRAZOLE SODIUM 40 MG PO TBEC: 40.0000 mg | DELAYED_RELEASE_TABLET | Freq: Every day | ORAL | Status: DC

## 2016-01-06 MED ORDER — FENTANYL CITRATE (PF) 100 MCG/2ML IJ SOLN: 25.0000 ug | INTRAMUSCULAR | Status: DC | PRN

## 2016-01-06 MED ORDER — ONDANSETRON HCL 4 MG/2ML IJ SOLN
INTRAMUSCULAR | Status: DC | PRN
  Administered 2016-01-06: 4 mg via INTRAVENOUS

## 2016-01-06 MED ORDER — ISOSORBIDE MONONITRATE ER 30 MG PO TB24
30.0000 mg | ORAL_TABLET | Freq: Every day | ORAL | Status: DC
  Administered 2016-01-07 – 2016-01-16 (×10): 30 mg via ORAL
  Filled 2016-01-06 (×10): qty 1

## 2016-01-06 MED ORDER — ONDANSETRON HCL 4 MG PO TABS: 4.0000 mg | ORAL_TABLET | ORAL | Status: DC | PRN

## 2016-01-06 MED ORDER — GUAIFENESIN 200 MG PO TABS
400.0000 mg | ORAL_TABLET | Freq: Two times a day (BID) | ORAL | Status: DC
  Administered 2016-01-06 – 2016-01-16 (×20): 400 mg via ORAL
  Filled 2016-01-06 (×21): qty 2

## 2016-01-06 MED ORDER — PROPOFOL 10 MG/ML IV BOLUS
INTRAVENOUS | Status: DC | PRN
  Administered 2016-01-06 (×2): 20 mg via INTRAVENOUS
  Administered 2016-01-06: 50 mg via INTRAVENOUS

## 2016-01-06 MED ORDER — METOPROLOL TARTRATE 50 MG PO TABS
50.0000 mg | ORAL_TABLET | Freq: Two times a day (BID) | ORAL | Status: DC
  Administered 2016-01-06 – 2016-01-16 (×20): 50 mg via ORAL
  Filled 2016-01-06 (×20): qty 1

## 2016-01-06 MED ORDER — FENTANYL CITRATE (PF) 250 MCG/5ML IJ SOLN
INTRAMUSCULAR | Status: AC
  Filled 2016-01-06: qty 5

## 2016-01-06 MED ORDER — ONDANSETRON HCL 4 MG/2ML IJ SOLN
4.0000 mg | INTRAMUSCULAR | Status: DC | PRN
  Administered 2016-01-08 – 2016-01-10 (×2): 4 mg via INTRAVENOUS
  Filled 2016-01-06 (×2): qty 2

## 2016-01-06 MED ORDER — SODIUM CHLORIDE 0.9 % IV SOLN
0.1500 ug/kg/min | INTRAVENOUS | Status: DC
  Administered 2016-01-06: .2 ug/kg/min via INTRAVENOUS
  Filled 2016-01-06: qty 5000

## 2016-01-06 MED ORDER — HYDROMORPHONE HCL 1 MG/ML IJ SOLN: 0.5000 mg | INTRAMUSCULAR | Status: DC | PRN

## 2016-01-06 MED ORDER — CEFAZOLIN SODIUM-DEXTROSE 2-4 GM/100ML-% IV SOLN
2.0000 g | Freq: Three times a day (TID) | INTRAVENOUS | Status: AC
  Administered 2016-01-06 – 2016-01-07 (×2): 2 g via INTRAVENOUS
  Filled 2016-01-06 (×2): qty 100

## 2016-01-06 MED ORDER — POTASSIUM CHLORIDE IN NACL 20-0.45 MEQ/L-% IV SOLN
INTRAVENOUS | Status: DC
  Administered 2016-01-06: 15:00:00 via INTRAVENOUS
  Administered 2016-01-07: 1000 mL via INTRAVENOUS
  Administered 2016-01-07: 07:00:00 via INTRAVENOUS
  Filled 2016-01-06 (×5): qty 1000

## 2016-01-06 MED ORDER — DEXAMETHASONE SODIUM PHOSPHATE 10 MG/ML IJ SOLN
6.0000 mg | Freq: Four times a day (QID) | INTRAMUSCULAR | Status: AC
  Administered 2016-01-06 – 2016-01-07 (×4): 6 mg via INTRAVENOUS
  Filled 2016-01-06 (×4): qty 1

## 2016-01-06 MED ORDER — SODIUM CHLORIDE 0.9 % IR SOLN
Status: DC | PRN
  Administered 2016-01-06: 1000 mL

## 2016-01-06 MED ORDER — LABETALOL HCL 5 MG/ML IV SOLN: 10.0000 mg | INTRAVENOUS | Status: DC | PRN

## 2016-01-06 MED ORDER — SALINE SPRAY 0.65 % NA SOLN
1.0000 | NASAL | Status: DC | PRN
  Filled 2016-01-06: qty 44

## 2016-01-06 MED ORDER — FAMOTIDINE IN NACL 20-0.9 MG/50ML-% IV SOLN
20.0000 mg | Freq: Two times a day (BID) | INTRAVENOUS | Status: DC
  Administered 2016-01-06 – 2016-01-08 (×5): 20 mg via INTRAVENOUS
  Filled 2016-01-06 (×7): qty 50

## 2016-01-06 MED ORDER — MIDAZOLAM HCL 2 MG/2ML IJ SOLN: 0.5000 mg | Freq: Once | INTRAMUSCULAR | Status: DC | PRN

## 2016-01-06 MED ORDER — MANNITOL 25 % IV SOLN
INTRAVENOUS | Status: DC | PRN
  Administered 2016-01-06: 25 g via INTRAVENOUS

## 2016-01-06 MED ORDER — CEFAZOLIN SODIUM-DEXTROSE 2-4 GM/100ML-% IV SOLN
2.0000 g | INTRAVENOUS | Status: AC
  Administered 2016-01-06: 2 g via INTRAVENOUS
  Filled 2016-01-06: qty 100

## 2016-01-06 MED ORDER — ASPIRIN EC 81 MG PO TBEC
81.0000 mg | DELAYED_RELEASE_TABLET | Freq: Every day | ORAL | Status: DC
  Administered 2016-01-08 – 2016-01-10 (×3): 81 mg via ORAL
  Filled 2016-01-06 (×4): qty 1

## 2016-01-06 MED ORDER — IPRATROPIUM-ALBUTEROL 0.5-2.5 (3) MG/3ML IN SOLN
3.0000 mL | Freq: Four times a day (QID) | RESPIRATORY_TRACT | Status: DC
  Administered 2016-01-06 – 2016-01-10 (×17): 3 mL via RESPIRATORY_TRACT
  Filled 2016-01-06 (×17): qty 3

## 2016-01-06 MED ORDER — AMLODIPINE BESYLATE 5 MG PO TABS
5.0000 mg | ORAL_TABLET | Freq: Every day | ORAL | Status: DC
  Administered 2016-01-07 – 2016-01-16 (×10): 5 mg via ORAL
  Filled 2016-01-06 (×10): qty 1

## 2016-01-06 MED ORDER — MEPERIDINE HCL 25 MG/ML IJ SOLN: 6.2500 mg | INTRAMUSCULAR | Status: DC | PRN

## 2016-01-06 MED ORDER — LISINOPRIL 20 MG PO TABS: 40.0000 mg | ORAL_TABLET | Freq: Every day | ORAL | Status: DC

## 2016-01-06 MED ORDER — PROMETHAZINE HCL 25 MG/ML IJ SOLN: 6.2500 mg | INTRAMUSCULAR | Status: DC | PRN

## 2016-01-06 MED ORDER — ARTIFICIAL TEARS OP OINT
TOPICAL_OINTMENT | OPHTHALMIC | Status: AC
  Filled 2016-01-06: qty 3.5

## 2016-01-06 MED ORDER — FENTANYL CITRATE (PF) 250 MCG/5ML IJ SOLN
INTRAMUSCULAR | Status: DC | PRN
  Administered 2016-01-06: 100 ug via INTRAVENOUS

## 2016-01-06 MED ORDER — PROMETHAZINE HCL 25 MG PO TABS
12.5000 mg | ORAL_TABLET | ORAL | Status: DC | PRN
  Administered 2016-01-10 – 2016-01-11 (×2): 25 mg via ORAL
  Filled 2016-01-06 (×2): qty 1

## 2016-01-06 MED ORDER — LIDOCAINE 2% (20 MG/ML) 5 ML SYRINGE
INTRAMUSCULAR | Status: AC
  Filled 2016-01-06: qty 5

## 2016-01-06 MED ORDER — SODIUM CHLORIDE 0.9 % IR SOLN
Status: DC | PRN
  Administered 2016-01-06: 500 mL

## 2016-01-06 MED ORDER — BACITRACIN ZINC 500 UNIT/GM EX OINT
TOPICAL_OINTMENT | CUTANEOUS | Status: DC | PRN
  Administered 2016-01-06: 1 via TOPICAL

## 2016-01-06 MED ORDER — BUDESONIDE 0.25 MG/2ML IN SUSP
0.2500 mg | Freq: Two times a day (BID) | RESPIRATORY_TRACT | Status: DC
  Administered 2016-01-06 – 2016-01-16 (×20): 0.25 mg via RESPIRATORY_TRACT
  Filled 2016-01-06 (×20): qty 2

## 2016-01-06 MED ORDER — AZITHROMYCIN 250 MG PO TABS: 250.0000 mg | ORAL_TABLET | Freq: Every day | ORAL | Status: DC

## 2016-01-06 MED ORDER — FUROSEMIDE 40 MG PO TABS
40.0000 mg | ORAL_TABLET | Freq: Every day | ORAL | Status: DC
  Administered 2016-01-07 – 2016-01-10 (×4): 40 mg via ORAL
  Filled 2016-01-06: qty 1
  Filled 2016-01-06: qty 2
  Filled 2016-01-06 (×2): qty 1

## 2016-01-06 MED ORDER — INSULIN ASPART 100 UNIT/ML ~~LOC~~ SOLN
2.0000 [IU] | SUBCUTANEOUS | Status: DC
  Administered 2016-01-07: 2 [IU] via SUBCUTANEOUS
  Administered 2016-01-07: 4 [IU] via SUBCUTANEOUS
  Administered 2016-01-08 (×2): 2 [IU] via SUBCUTANEOUS
  Administered 2016-01-08 (×2): 4 [IU] via SUBCUTANEOUS
  Administered 2016-01-09: 2 [IU] via SUBCUTANEOUS

## 2016-01-06 MED ORDER — IPRATROPIUM-ALBUTEROL 0.5-2.5 (3) MG/3ML IN SOLN: 3.0000 mL | Freq: Four times a day (QID) | RESPIRATORY_TRACT | Status: DC

## 2016-01-06 MED ORDER — CLONIDINE HCL 0.1 MG PO TABS
0.1000 mg | ORAL_TABLET | Freq: Two times a day (BID) | ORAL | Status: DC
  Administered 2016-01-06 – 2016-01-16 (×20): 0.1 mg via ORAL
  Filled 2016-01-06 (×21): qty 1

## 2016-01-06 MED ORDER — DEXAMETHASONE SODIUM PHOSPHATE 10 MG/ML IJ SOLN
10.0000 mg | INTRAMUSCULAR | Status: AC
  Administered 2016-01-06: 10 mg via INTRAVENOUS
  Filled 2016-01-06: qty 1

## 2016-01-06 MED ORDER — SUGAMMADEX SODIUM 200 MG/2ML IV SOLN
INTRAVENOUS | Status: DC | PRN
  Administered 2016-01-06: 200 mg via INTRAVENOUS

## 2016-01-06 MED ORDER — SUGAMMADEX SODIUM 200 MG/2ML IV SOLN
INTRAVENOUS | Status: AC
  Filled 2016-01-06: qty 2

## 2016-01-06 MED ORDER — ESMOLOL HCL 100 MG/10ML IV SOLN
INTRAVENOUS | Status: AC
  Filled 2016-01-06: qty 10

## 2016-01-06 MED ORDER — THROMBIN 20000 UNITS EX KIT
PACK | CUTANEOUS | Status: DC | PRN
  Administered 2016-01-06: 20000 [IU] via TOPICAL

## 2016-01-06 MED ORDER — LIDOCAINE-EPINEPHRINE 1 %-1:100000 IJ SOLN
INTRAMUSCULAR | Status: DC | PRN
  Administered 2016-01-06: 10 mL

## 2016-01-06 MED ORDER — ONDANSETRON HCL 4 MG/2ML IJ SOLN
INTRAMUSCULAR | Status: AC
  Filled 2016-01-06: qty 2

## 2016-01-06 MED ORDER — CLONAZEPAM 0.5 MG PO TABS
0.5000 mg | ORAL_TABLET | Freq: Two times a day (BID) | ORAL | Status: DC | PRN
  Administered 2016-01-06 – 2016-01-16 (×11): 0.5 mg via ORAL
  Filled 2016-01-06 (×11): qty 1

## 2016-01-06 MED ORDER — MICROFIBRILLAR COLL HEMOSTAT EX PADS
MEDICATED_PAD | CUTANEOUS | Status: DC | PRN
  Administered 2016-01-06: 1 via TOPICAL

## 2016-01-06 MED ORDER — PRAMIPEXOLE DIHYDROCHLORIDE 1 MG PO TABS
1.0000 mg | ORAL_TABLET | Freq: Every day | ORAL | Status: DC
  Administered 2016-01-06 – 2016-01-15 (×10): 1 mg via ORAL
  Filled 2016-01-06 (×10): qty 1
  Filled 2016-01-06: qty 8
  Filled 2016-01-06: qty 1

## 2016-01-06 MED ORDER — DEXAMETHASONE SODIUM PHOSPHATE 4 MG/ML IJ SOLN: 4.0000 mg | Freq: Three times a day (TID) | INTRAMUSCULAR | Status: DC

## 2016-01-06 MED ORDER — LEVETIRACETAM 750 MG PO TABS
1500.0000 mg | ORAL_TABLET | Freq: Two times a day (BID) | ORAL | Status: DC
  Administered 2016-01-06 – 2016-01-16 (×20): 1500 mg via ORAL
  Filled 2016-01-06 (×20): qty 2

## 2016-01-06 MED ORDER — ROCURONIUM BROMIDE 50 MG/5ML IV SOLN
INTRAVENOUS | Status: AC
  Filled 2016-01-06: qty 1

## 2016-01-06 MED ORDER — SODIUM CHLORIDE 0.9 % IV SOLN
INTRAVENOUS | Status: DC | PRN
  Administered 2016-01-06 (×3): via INTRAVENOUS

## 2016-01-06 MED ORDER — FUROSEMIDE 40 MG PO TABS: 40.0000 mg | ORAL_TABLET | Freq: Two times a day (BID) | ORAL | Status: DC

## 2016-01-06 MED ORDER — ROCURONIUM BROMIDE 100 MG/10ML IV SOLN
INTRAVENOUS | Status: DC | PRN
  Administered 2016-01-06: 50 mg via INTRAVENOUS
  Administered 2016-01-06: 20 mg via INTRAVENOUS

## 2016-01-06 MED ORDER — LEVOFLOXACIN 500 MG PO TABS
500.0000 mg | ORAL_TABLET | Freq: Every day | ORAL | Status: DC
  Administered 2016-01-06 – 2016-01-10 (×5): 500 mg via ORAL
  Filled 2016-01-06 (×5): qty 1

## 2016-01-06 MED ORDER — HYDROCODONE-ACETAMINOPHEN 7.5-325 MG PO TABS
1.0000 | ORAL_TABLET | Freq: Four times a day (QID) | ORAL | Status: DC | PRN
  Administered 2016-01-08 – 2016-01-09 (×2): 1 via ORAL
  Filled 2016-01-06 (×2): qty 1

## 2016-01-06 MED ORDER — ZOLPIDEM TARTRATE 5 MG PO TABS: 5.0000 mg | ORAL_TABLET | Freq: Every evening | ORAL | Status: DC | PRN

## 2016-01-06 MED ORDER — LEVOFLOXACIN 500 MG PO TABS: 500.0000 mg | ORAL_TABLET | Freq: Every day | ORAL | Status: DC

## 2016-01-06 SURGICAL SUPPLY — 96 items
BAG DECANTER FOR FLEXI CONT (MISCELLANEOUS) ×3 IMPLANT
BANDAGE GAUZE 4  KLING STR (GAUZE/BANDAGES/DRESSINGS) ×3 IMPLANT
BLADE CLIPPER SURG (BLADE) ×3 IMPLANT
BLADE SURG 11 STRL SS (BLADE) ×3 IMPLANT
BNDG COHESIVE 4X5 TAN NS LF (GAUZE/BANDAGES/DRESSINGS) ×3 IMPLANT
BRUSH SCRUB EZ 1% IODOPHOR (MISCELLANEOUS) IMPLANT
BRUSH SCRUB EZ PLAIN DRY (MISCELLANEOUS) ×3 IMPLANT
BUR ACORN 9.0 PRECISION (BURR) ×2 IMPLANT
BUR ACORN 9.0MM PRECISION (BURR) ×1
BUR SPIRAL ROUTER 2.3 (BUR) ×2 IMPLANT
BUR SPIRAL ROUTER 2.3MM (BUR) ×1
CANISTER SUCT 3000ML PPV (MISCELLANEOUS) ×6 IMPLANT
CLIP TI MEDIUM 6 (CLIP) ×3 IMPLANT
CONT SPEC 4OZ CLIKSEAL STRL BL (MISCELLANEOUS) ×3 IMPLANT
COVER BACK TABLE 60X90IN (DRAPES) ×3 IMPLANT
DECANTER SPIKE VIAL GLASS SM (MISCELLANEOUS) ×3 IMPLANT
DRAIN JACKSON PRATT 1/4 1325 (MISCELLANEOUS) ×3 IMPLANT
DRAPE CAMERA VIDEO/LASER (DRAPES) IMPLANT
DRAPE LONG LASER MIC (DRAPES) IMPLANT
DRAPE MICROSCOPE LEICA (MISCELLANEOUS) ×3 IMPLANT
DRAPE STERI IOBAN 125X83 (DRAPES) IMPLANT
DRAPE WARM FLUID 44X44 (DRAPE) ×3 IMPLANT
DRSG OPSITE 4X5.5 SM (GAUZE/BANDAGES/DRESSINGS) ×3 IMPLANT
DRSG OPSITE POSTOP 4X8 (GAUZE/BANDAGES/DRESSINGS) ×3 IMPLANT
ELECT CAUTERY BLADE 6.4 (BLADE) ×3 IMPLANT
ELECT REM PT RETURN 9FT ADLT (ELECTROSURGICAL) ×3
ELECTRODE REM PT RTRN 9FT ADLT (ELECTROSURGICAL) ×1 IMPLANT
EVACUATOR SILICONE 100CC (DRAIN) ×3 IMPLANT
GAUZE SPONGE 4X4 12PLY STRL (GAUZE/BANDAGES/DRESSINGS) ×3 IMPLANT
GAUZE SPONGE 4X4 16PLY XRAY LF (GAUZE/BANDAGES/DRESSINGS) ×3 IMPLANT
GLOVE BIO SURGEON STRL SZ 6.5 (GLOVE) IMPLANT
GLOVE BIO SURGEON STRL SZ7 (GLOVE) IMPLANT
GLOVE BIO SURGEON STRL SZ7.5 (GLOVE) IMPLANT
GLOVE BIO SURGEON STRL SZ8 (GLOVE) ×27 IMPLANT
GLOVE BIO SURGEON STRL SZ8.5 (GLOVE) IMPLANT
GLOVE BIO SURGEONS STRL SZ 6.5 (GLOVE)
GLOVE BIOGEL M 8.0 STRL (GLOVE) IMPLANT
GLOVE BIOGEL PI IND STRL 8.5 (GLOVE) ×2 IMPLANT
GLOVE BIOGEL PI INDICATOR 8.5 (GLOVE) ×4
GLOVE ECLIPSE 6.5 STRL STRAW (GLOVE) ×6 IMPLANT
GLOVE ECLIPSE 7.0 STRL STRAW (GLOVE) ×12 IMPLANT
GLOVE ECLIPSE 7.5 STRL STRAW (GLOVE) IMPLANT
GLOVE ECLIPSE 8.0 STRL XLNG CF (GLOVE) IMPLANT
GLOVE ECLIPSE 8.5 STRL (GLOVE) IMPLANT
GLOVE EXAM NITRILE LRG STRL (GLOVE) IMPLANT
GLOVE EXAM NITRILE MD LF STRL (GLOVE) IMPLANT
GLOVE EXAM NITRILE XL STR (GLOVE) IMPLANT
GLOVE EXAM NITRILE XS STR PU (GLOVE) IMPLANT
GLOVE INDICATOR 6.5 STRL GRN (GLOVE) IMPLANT
GLOVE INDICATOR 7.0 STRL GRN (GLOVE) IMPLANT
GLOVE INDICATOR 7.5 STRL GRN (GLOVE) IMPLANT
GLOVE INDICATOR 8.0 STRL GRN (GLOVE) IMPLANT
GLOVE INDICATOR 8.5 STRL (GLOVE) ×9 IMPLANT
GLOVE OPTIFIT SS 8.0 STRL (GLOVE) IMPLANT
GLOVE SURG SS PI 6.5 STRL IVOR (GLOVE) IMPLANT
GOWN STRL REUS W/ TWL LRG LVL3 (GOWN DISPOSABLE) ×1 IMPLANT
GOWN STRL REUS W/ TWL XL LVL3 (GOWN DISPOSABLE) ×1 IMPLANT
GOWN STRL REUS W/TWL 2XL LVL3 (GOWN DISPOSABLE) ×3 IMPLANT
GOWN STRL REUS W/TWL LRG LVL3 (GOWN DISPOSABLE) ×2
GOWN STRL REUS W/TWL XL LVL3 (GOWN DISPOSABLE) ×2
HEMOSTAT SURGICEL 2X14 (HEMOSTASIS) IMPLANT
KIT BASIN OR (CUSTOM PROCEDURE TRAY) ×3 IMPLANT
KIT ROOM TURNOVER OR (KITS) ×3 IMPLANT
LIQUID BAND (GAUZE/BANDAGES/DRESSINGS) ×3 IMPLANT
MARKER SPHERE PSV REFLC 13MM (MARKER) ×6 IMPLANT
NEEDLE HYPO 25X1 1.5 SAFETY (NEEDLE) ×3 IMPLANT
NS IRRIG 1000ML POUR BTL (IV SOLUTION) ×6 IMPLANT
PACK CRANIOTOMY (CUSTOM PROCEDURE TRAY) ×3 IMPLANT
PAD ARMBOARD 7.5X6 YLW CONV (MISCELLANEOUS) ×9 IMPLANT
PAD EYE OVAL STERILE LF (GAUZE/BANDAGES/DRESSINGS) IMPLANT
PATTIES SURGICAL .25X.25 (GAUZE/BANDAGES/DRESSINGS) IMPLANT
PATTIES SURGICAL .5 X.5 (GAUZE/BANDAGES/DRESSINGS) IMPLANT
PATTIES SURGICAL .5 X3 (DISPOSABLE) ×6 IMPLANT
PATTIES SURGICAL 1X1 (DISPOSABLE) IMPLANT
PIN MAYFIELD SKULL DISP (PIN) ×3 IMPLANT
PLATE 1.5  2HOLE MED NEURO (Plate) ×2 IMPLANT
PLATE 1.5 2HOLE MED NEURO (Plate) ×1 IMPLANT
PLATE 1.5/0.5 18.5MM BURR HOLE (Plate) ×6 IMPLANT
RUBBERBAND STERILE (MISCELLANEOUS) ×6 IMPLANT
SCREW SELF DRILL HT 1.5/4MM (Screw) ×30 IMPLANT
SPONGE NEURO XRAY DETECT 1X3 (DISPOSABLE) IMPLANT
SPONGE SURGIFOAM ABS GEL 100 (HEMOSTASIS) IMPLANT
SPONGE SURGIFOAM ABS GEL 12-7 (HEMOSTASIS) IMPLANT
SPONGE SURGIFOAM ABS GEL SZ50 (HEMOSTASIS) ×3 IMPLANT
STAPLER VISISTAT 35W (STAPLE) ×3 IMPLANT
SUT NURALON 4 0 TR CR/8 (SUTURE) ×6 IMPLANT
SUT VIC AB 2-0 CT1 18 (SUTURE) ×3 IMPLANT
SYR CONTROL 10ML LL (SYRINGE) ×3 IMPLANT
TIP SONASTAR PREC MISONIX 1.1 (TRAY / TRAY PROCEDURE) IMPLANT
TOWEL OR 17X24 6PK STRL BLUE (TOWEL DISPOSABLE) ×3 IMPLANT
TOWEL OR 17X26 10 PK STRL BLUE (TOWEL DISPOSABLE) ×3 IMPLANT
TRAY FOLEY W/METER SILVER 16FR (SET/KITS/TRAYS/PACK) ×3 IMPLANT
TUBE CONNECTING 12'X1/4 (SUCTIONS) ×1
TUBE CONNECTING 12X1/4 (SUCTIONS) ×2 IMPLANT
UNDERPAD 30X30 INCONTINENT (UNDERPADS AND DIAPERS) ×3 IMPLANT
WATER STERILE IRR 1000ML POUR (IV SOLUTION) ×3 IMPLANT

## 2016-01-06 NOTE — Op Note (Signed)
preoperative diagnosis: Right parietal occipital metastatic lung cancer  Postoperative diagnosis: Same  Procedure: Stereotactic right parietal occipital craniectomy for resection of) occipital met utilizing the BrainLab curve stereotactic navigation system  Surgeon: Gary cram  Assistant: Joseph Stern  Anesthesia: Gen.  EBL: Minimal   History of present illness:patient is very pleasant 75-year-old gentleman who's had long-standing known metastatic lung cancer is had multiple small lesions treated with surgical radiosurgery developed a progressive increase in size or right parietal occipital lesion that was recommended anterior board for preoperative radiosurgery subsequent resection. The risks and benefits of the procedure were extensively gone over the patient and his wife. They understand the risks benefits perioperative course expectations of outcome and alternatives of surgery.  Operative procedure: After preoperative surgery to radiosurgery was delivered yesterday the patient was brought into the or was induced under general anesthesia positioned prone in pins the right side of his occiput was shaved prepped and draped in routine sterile fashion. Utilizing the BrainLab stereotactic navigation system I selected the best place to map out a linear incision and after adequate prepping and draping infiltrated with 8 mL lidocaine with epi a linear incision was made centering over the lesion in the right occipital lobe. Turned a craniotomy flap and again utilizing the navigation system flap the dura opened medially. Placed dural tack up sutures the dural and the brain was a markedly swollen and the gyri expanded. The expanded gyrus was confirmed by navigation be the source of the tumor and corticectomy was made and utilizing combination of blunt and sharp dissection identified and abnormal-appearing mass confirmed to be the tumor with the navigation system. As is worked around the mass and into the mass  a thick milky substance immediately became expressed this was felt to be more necrotic center of an adenocarcinoma confirming tumor presents however we also sent cultures to rule out infection. Sent multiple specimens of this mass for permanent pathology only. Developed a plane around the edematous white matter and peel surfaces and resected what grossly appeared to be all the abnormal tissue confirmed margins with the navigation system. The wounds and to proceed irrigated meticulous in space was maintained surgeon so was overlaid in the tumor bed the dura was reapproximated with interrupted Nurolon's utilizing of Gelfoam in the craniotomy defect reattach the flap with the Biomet plating system closed the scalp with interrupted Vicryl's and staples. I did place a J-P drain in the subgaleal space. Patient was then repositioned and sent to recovery with determination as far as timing of extubation. At the end of case all needle counts and sponge counts were correct.   

## 2016-01-06 NOTE — Consult Note (Signed)
PULMONARY / CRITICAL CARE MEDICINE   Name: [name] MRN: [id number] DOB: November 04, 1940    ADMISSION DATE:  01/06/2016 CONSULTATION DATE:  01/06/16  REFERRING MD:  Dr. Saintclair Halsted   CHIEF COMPLAINT:  Post-Op Tumor Removal.  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    Surgical note review reflects resection of a mass that was likely necrotic as it leaked a thick white-milky substance.  Specimens were sent for pathology as well as cultures.  JP drain was placed in the subgaleal space. He was sent to recovery intubated.  He was subsequently liberated from mechanical ventilation in PACU without difficulty.  He was returned to ICU on [place] O2.    PCCM consulted for ICU support.    PAST MEDICAL HISTORY :  He  has a past medical history of Arthritis; Wheezing; Sore throat; Carotid artery occlusion; COPD (chronic obstructive pulmonary disease) ([place]); Lumbar disc disease; Restless leg syndrome; Allergic rhinitis; Hypertension; Anxiety; Shortness of breath; Lung mass; GERD (gastroesophageal reflux disease); H/O hiatal hernia; Pre-operative cardiovascular examination; Nonspecific abnormal unspecified cardiovascular function study; Peripheral vascular disease, unspecified ([place]);  Pneumonia (2014); On home oxygen therapy; Constipation; S/P radiation therapy (09/25/14); S/P radiation therapy (12/04/14 srs); S/P radiation therapy (07/18/15); Seizures ([place]) (12/13/15); Cancer ([place]) (09/14/12); Metastasis to brain [facility]) (02/2015); History of chemotherapy; Headache; and Occasional tremors.  PAST SURGICAL HISTORY: He  has past surgical history that includes Carotid endarterectomy (10/15/2010); Hand surgery; Video bronchoscopy (07/24/2012); Lobectomy (Right, 09/14/2012); Video assisted thoracoscopy (Right, 09/14/2012); Video bronchoscopy with endobronchial ultrasound (N/A, 08/27/2014); Scalene node biopsy (Right, 08/27/2014); sterotactic radiosurgery (Right, 09/25/14); power port a cath (Right); and Colonoscopy.  No Known Allergies  Current Facility-Administered Medications on File Prior to Encounter  Medication  . sodium chloride 0.9 % injection 10 mL   Current Outpatient Prescriptions on File Prior to Encounter  Medication Sig  . aspirin EC 81 MG tablet Take 81 mg by mouth daily. Reported on 12/22/2015  . clonazePAM (KLONOPIN) 0.5 MG tablet Take 0.5 mg by mouth 2 (two) times daily as needed for anxiety. Takes one tablet by mouth two times daily as needed for anxiety.  Marland Kitchen guaifenesin (HUMIBID E) 400 MG TABS tablet Take 400 mg by mouth 2 (two) times daily.  Marland Kitchen HYDROcodone-acetaminophen (NORCO) 7.5-325 MG tablet Take 1 tablet by mouth every 6 (six) hours as needed for moderate pain.  Marland Kitchen ipratropium-albuterol (DUONEB) 0.5-2.5 (3) MG/3ML SOLN Take 3 mLs by nebulization 4 (four) times daily. (Patient taking differently: Take 3 mLs by nebulization 4 (four) times daily. Pt takes nebulizer 2 to 3 times daily.)  . isosorbide mononitrate (IMDUR) 30 MG 24 hr tablet Take 1 tablet (30 mg total) by mouth daily.  Marland Kitchen lisinopril (PRINIVIL,ZESTRIL) 40 MG tablet Take 40 mg by mouth at bedtime.   . metoprolol tartrate (LOPRESSOR) 25 MG tablet Take 1  tablet (25 mg total) by mouth 2 (two) times daily. For systolic BP of 235  or greater take 50 mg BID (Patient taking differently: Take 50 mg by mouth 2 (two) times daily. For systolic BP of 573 or greater take 50 mg BID)  . omeprazole (PRILOSEC) 40 MG capsule Take 40 mg by mouth daily.  . OXYGEN Inhale 2-3 L into the lungs continuous.  . pramipexole (MIRAPEX) 1 MG tablet Take 1 mg by mouth at bedtime.  . traMADol (ULTRAM) 50 MG tablet Take 1 tablet (50 mg total) by mouth every 12 (twelve) hours as needed. (Patient taking differently: Take 50 mg by mouth daily as needed (pain). )  . triamcinolone cream (KENALOG) 0.1 % Apply 1 application topically 2 (two) times daily as needed (itching/ dry skin).   Marland Kitchen zolpidem (AMBIEN) 10 MG tablet Take 10 mg by mouth at bedtime as needed for sleep. Reported on 09/25/2015    FAMILY HISTORY:  His indicated that his mother is deceased. He indicated that his father is deceased. He indicated that his sister is alive. He indicated that only one of his two brothers is alive.   SOCIAL HISTORY: He  reports that he quit smoking about 16 months ago. His smoking use included Cigarettes. He has a 55 pack-year smoking history. He quit smokeless tobacco use about 63 years ago. He reports that he drinks alcohol. He reports that he does not use illicit drugs.  REVIEW OF SYSTEMS:  POSITIVES IN BOLD Gen: Denies fever, chills, weight change, fatigue, night sweats HEENT: Denies blurred vision, double vision, hearing loss, tinnitus, sinus congestion, rhinorrhea, sore throat, neck stiffness, dysphagia PULM: Denies shortness of breath, cough, sputum production, hemoptysis, wheezing CV: Denies chest pain, edema, orthopnea, paroxysmal nocturnal dyspnea, palpitations GI: Denies abdominal pain, nausea, vomiting, diarrhea, hematochezia, melena, constipation, change in bowel habits GU: Denies dysuria, hematuria, polyuria, oliguria, urethral discharge Endocrine: Denies hot or cold intolerance, polyuria, polyphagia or appetite change Derm: Denies rash, dry skin,  scaling or peeling skin change Heme: Denies easy bruising, bleeding, bleeding gums Neuro: Denies headache, numbness, weakness, slurred speech, loss of memory or consciousness   SUBJECTIVE:  Pt reports he feels his breathing is "a little better".  Denies SOB, pain, headache  VITAL SIGNS: BP 157/63 mmHg  Pulse 53  Temp(Src) 97.5 F (36.4 C)  Resp 17  SpO2 98%  HEMODYNAMICS:    VENTILATOR SETTINGS:    INTAKE / OUTPUT:    PHYSICAL EXAMINATION: General:  Chronically ill appearing male in NAD  Neuro:  AAOx4, speech clear, MAE  HEENT:  MM pink/moist, no jvd Cardiovascular:  s1s2 rrr, bradycardic, R chest wall port Lungs:  Even/non-labored, lungs bilaterally with wheezing Abdomen:  Obese/soft, bsx4 active  Musculoskeletal:  No acute deformities  Skin:  Warm/dry, no edema   LABS:  BMET  Recent Labs Lab 01/01/16 0907 01/06/16 0725 01/06/16 1217  NA 128* 130* 131*  K 5.0 4.2 4.5  CL 90*  --  93*  CO2 33*  --  32  BUN 25*  --  19  CREATININE 0.83  --  0.83  GLUCOSE 77  --  119*    Electrolytes  Recent Labs Lab 01/01/16 0907 01/06/16 1217  CALCIUM 8.0* 7.4*    CBC  Recent Labs Lab 01/01/16 0907 01/06/16 0725 01/06/16 1217  WBC 9.9  --  5.8  HGB 10.9* 11.6* 10.1*  HCT 33.9* 34.0* 31.5*  PLT 146*  --  122*    Coag's No results for input(s): APTT,  INR in the last 168 hours.  Sepsis Markers No results for input(s): LATICACIDVEN, PROCALCITON, O2SATVEN in the last 168 hours.  ABG  Recent Labs Lab 01/06/16 0725  PHART 7.433  PCO2ART 57.7*  PO2ART 101.0*    Liver Enzymes No results for input(s): AST, ALT, ALKPHOS, BILITOT, ALBUMIN in the last 168 hours.  Cardiac Enzymes No results for input(s): TROPONINI, PROBNP in the last 168 hours.  Glucose No results for input(s): GLUCAP in the last 168 hours.  Imaging No results found.   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  Returned to ICU extubated and PCCM consulted for medical management.   ASSESSMENT / PLAN:  NEUROLOGIC A:   Squamous Cell Lung Cancer with Metastasis to the Brain  Seizures - secondary to above  Anxiety RLS  P:   RASS goal: n/a Continue PRN klonopin Dexamethasone taper per NSGY PRN Norco, dilaudid for pain  Continue Keppra for seizures Follow intra-op cultures / pathology  Continue mirapex  PULMONARY A: O2 Dependent GOLD 3-4 COPD Stage IV Squamous Cell Lung Cancer with Metastasis to Brain, Adrenals, Lymphangitic Spread  Former Tobacco Abuse P:   Pulmonary hygiene - IS, mobilize as able  CXR PRN  O2 to support sats 88-95% Continue guaifenesin   CARDIOVASCULAR A:  Hx HTN, PVD/CAD P:  Continue ASA, Norvasc, Clonidine, Imdur, Lopressor, Lisinopril PRN labetalol  ICU monitoring  Assess EKG post-operatively   RENAL A:   Hyponatremia  P:   Trend BMP / UOP Replace electrolytes as indicated   GASTROINTESTINAL A:   Hx GERD, Hiatal Hernia P:   NPO for now Pepcid   HEMATOLOGIC A:   Anemia - suspect of chronic disease Thrombocytopenia  P:  Monitor CBC Lovenox for DVT prophylaxis   INFECTIOUS A:   S/P R Parietal Mass Resection - milky white drainage during case from mass, infection vs necrosis P:    Monitor cultures as above  ABX as above   ENDOCRINE A:   Hyperglycemia    P:   Monitor glucose, ICU Hyperglycemia protocol     FAMILY  - Updates: No family at bedside upon assessment.    - Inter-disciplinary family meet or Palliative Care meeting due by:  7/4   Noe Gens, NP-C [facility] Pgr: [phone] or if no answer [phone] 01/06/2016, 1:15 PM

## 2016-01-06 NOTE — Anesthesia Postprocedure Evaluation (Signed)
Anesthesia Post Note  Patient: [name]  Procedure(s) Performed: Procedure(s) (LRB): CRANIOTOMY TUMOR EXCISION WITH CURVE  (N/A)  Patient location during evaluation: PACU Anesthesia Type: General Level of consciousness: sedated, oriented and patient cooperative Pain management: pain level controlled Vital Signs Assessment: post-procedure vital signs reviewed and stable Respiratory status: spontaneous breathing, nonlabored ventilation, respiratory function stable and patient connected to nasal cannula oxygen Cardiovascular status: blood pressure returned to baseline and stable Postop Assessment: no signs of nausea or vomiting Anesthetic complications: no    Last Vitals:  Filed Vitals:   01/06/16 1200 01/06/16 1215  BP: 160/64 174/82  Pulse: 56 57  Temp:    Resp: 20 19    Last Pain:  Filed Vitals:   01/06/16 1228  PainSc: 0-No pain    LLE Motor Response: Purposeful movement;Responds to commands (01/06/16 1215) LLE Sensation: Full sensation (01/06/16 1215) RLE Motor Response: Purposeful movement;Responds to commands (01/06/16 1215) RLE Sensation: Full sensation (01/06/16 1215)      Seleta Rhymes. Lylia Karn

## 2016-01-06 NOTE — Transfer of Care (Signed)
Immediate Anesthesia Transfer of Care Note  Patient: [name]  Procedure(s) Performed: Procedure(s): CRANIOTOMY TUMOR EXCISION WITH CURVE  (N/A)  Patient Location: PACU  Anesthesia Type:General  Level of Consciousness: lethargic and responds to stimulation  Airway & Oxygen Therapy: Patient Spontanous Breathing and Patient connected to face mask oxygen  Post-op Assessment: Report given to RN and Patient moving all extremities X 4  Post vital signs: Reviewed and stable  Last Vitals: There were no vitals filed for this visit.  Last Pain:  Filed Vitals:   01/06/16 0654  PainSc: 0-No pain         Complications: No apparent anesthesia complications

## 2016-01-06 NOTE — Anesthesia Procedure Notes (Addendum)
Central Venous Catheter Insertion Performed by: anesthesiologist 01/06/2016 8:05 AM Patient location: Pre-op. Preanesthetic checklist: patient identified, IV checked, risks and benefits discussed, surgical consent, monitors and equipment checked, pre-op evaluation and timeout performed Position: Trendelenburg Lidocaine 1% used for infiltration Landmarks identified Catheter size: 8 Fr Central line was placed.Double lumen Procedure performed using ultrasound guided technique. Attempts: 1 Following insertion, line sutured, dressing applied and Biopatch. Post procedure assessment: blood return through all ports, free fluid flow and no air. Patient tolerated the procedure well with no immediate complications. Additional procedure comments: CVP: Timeout, sterile prep, drape, FBP L neck.  Trendelenburg position.  1% lido local, finder and trocar LIJ 1st pass with US guidance.  2 lumen placed over J wire. Biopatch and sterile dressing on.  Patient tolerated well.  VSS.  C Jackson, MD.    Procedure Name: Intubation Date/Time: 01/06/2016 8:25 AM Performed by: RENNIE, JULIE E Pre-anesthesia Checklist: Patient identified, Emergency Drugs available, Suction available and Patient being monitored Patient Re-evaluated:Patient Re-evaluated prior to inductionOxygen Delivery Method: Circle system utilized Preoxygenation: Pre-oxygenation with 100% oxygen Intubation Type: IV induction Ventilation: Oral airway inserted - appropriate to patient size and Mask ventilation without difficulty Laryngoscope Size: Mac and 3 Grade View: Grade I Tube type: Subglottic suction tube Tube size: 7.5 mm Number of attempts: 1 Airway Equipment and Method: Stylet Placement Confirmation: ETT inserted through vocal cords under direct vision,  positive ETCO2 and breath sounds checked- equal and bilateral Secured at: 22 cm Tube secured with: Tape Dental Injury: Teeth and Oropharynx as per pre-operative assessment  Comments:  Intubation by Ashley Keenan, SRNA.     

## 2016-01-06 NOTE — H&P (Signed)
[name] is an 75 y.o. male.   Chief Complaint: Brain tumor HPI: Patient is a 75 year old gentleman with long-standing history of metastatic lung cancer undergone multiple stereotactic radiosurgical procedures 2 lesions in the brain has a new lesion right parietal occipital that is shown growth on serial MRIs. This was extensively discussed in tumor board and decided it would benefit from preoperative radiation and subsequent surgical resection. Patient is a significant risk for a pulmonary perspective he has been evaluated extensively and is taking care of by Dr. Lenna Gilford and is a patient is a moderately high risk for pulmonary perspective he is as tuned up as he is going to be and the timing is either now or never. Patient strongly wants the operation he understands the risks both from a neurosurgical and a pulmonary perspective. We've extensively had conversations with himself his wife as well as his other treating physicians. He understands the risks perioperative course expectations of outcome and alternatives of surgery.  Past Medical History  Diagnosis Date  . Arthritis   . Wheezing   . Sore throat   . Carotid artery occlusion   . COPD (chronic obstructive pulmonary disease) ([place])   . Lumbar disc disease   . Restless leg syndrome   . Allergic rhinitis   . Hypertension   . Anxiety     anxiety  . Shortness of breath   . Lung mass   . GERD (gastroesophageal reflux disease)   . H/O hiatal hernia   . Pre-operative cardiovascular examination   . Nonspecific abnormal unspecified cardiovascular function study   . Peripheral vascular disease, unspecified ([place])   . Pneumonia 2014    ?   Marland Kitchen On home oxygen therapy     prn  . Constipation   . 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 S/P radiation therapy 12/04/14 srs    lt frontal brain  . S/P radiation therapy 07/18/15    SRS left frontal brain 20Gy  . Seizures ([place]) 12/13/15  . Cancer ([place]) 09/14/12    invasive mod diff  squamous cell ca  . Metastasis to brain ([place]) 02/2015  . History of chemotherapy   . Headache   . Occasional tremors     feet and legs     Past Surgical History  Procedure Laterality Date  . Carotid endarterectomy  10/15/2010    left  . Hand surgery      repair of the left long, ring, and small finger after a saw injury  . Video bronchoscopy  07/24/2012    Procedure: VIDEO BRONCHOSCOPY WITH FLUORO;  Surgeon: Kathee Delton, MD;  Location: [facility];  Service: Cardiopulmonary;  Laterality: Bilateral;  . Lobectomy Right 09/14/2012    Procedure: LOBECTOMY;  Surgeon: Ivin Poot, MD;  Location: [place];  Service: Thoracic;  Laterality: Right;  RIGHT UPPER LOBECTOMY  . Video assisted thoracoscopy Right 09/14/2012    Procedure: VIDEO ASSISTED THORACOSCOPY;  Surgeon: Ivin Poot, MD;  Location: [place];  Service: Thoracic;  Laterality: Right;  . Video bronchoscopy with endobronchial ultrasound N/A 08/27/2014    Procedure: VIDEO BRONCHOSCOPY WITH ENDOBRONCHIAL ULTRASOUND;  Surgeon: Ivin Poot, MD;  Location: [facility];  Service: Thoracic;  Laterality: N/A;  . Scalene node biopsy Right 08/27/2014    Procedure: BIOPSY SCALENE NODE;  Surgeon: Ivin Poot, MD;  Location: [place];  Service: Thoracic;  Laterality: Right;  . Sterotactic radiosurgery Right 09/25/14    right parietal 30Gy/1 fx  .  Power port a cath Right   . Colonoscopy      Family History  Problem Relation Age of Onset  . Heart disease Father     MI  . Heart attack Father   . Alcohol abuse Mother   . Stroke Brother   . Heart disease Brother   . Depression Sister   . Heart attack Brother    Social History:  reports that he quit smoking about 16 months ago. His smoking use included Cigarettes. He has a 55 pack-year smoking history. He quit smokeless tobacco use about 63 years ago. He reports that he drinks alcohol. He reports that he does not use illicit drugs.  Allergies: No Known Allergies  Medications Prior to Admission   Medication Sig Dispense Refill  . amLODipine (NORVASC) 5 MG tablet Take 5 mg by mouth daily.    Marland Kitchen aspirin EC 81 MG tablet Take 81 mg by mouth daily. Reported on 12/22/2015    . azithromycin (ZITHROMAX) 250 MG tablet Take 1 tablet (250 mg total) by mouth daily. 6 each 0  . clonazePAM (KLONOPIN) 0.5 MG tablet Take 0.5 mg by mouth 2 (two) times daily as needed for anxiety. Takes one tablet by mouth two times daily as needed for anxiety.    . cloNIDine (CATAPRES) 0.1 MG tablet Take 0.1 mg by mouth 2 (two) times daily.    Marland Kitchen dexamethasone (DECADRON) 4 MG tablet Take 1 tablet (4 mg total) by mouth 3 (three) times daily.    . furosemide (LASIX) 20 MG tablet Take 2 tablets (40 mg total) by mouth daily. 60 tablet 5  . furosemide (LASIX) 40 MG tablet Take 1 tablet (40 mg total) by mouth 2 (two) times daily. 60 tablet 1  . guaifenesin (HUMIBID E) 400 MG TABS tablet Take 400 mg by mouth 2 (two) times daily.    Marland Kitchen HYDROcodone-acetaminophen (NORCO) 7.5-325 MG tablet Take 1 tablet by mouth every 6 (six) hours as needed for moderate pain. 45 tablet 0  . ipratropium-albuterol (DUONEB) 0.5-2.5 (3) MG/3ML SOLN Take 3 mLs by nebulization 4 (four) times daily. (Patient taking differently: Take 3 mLs by nebulization 4 (four) times daily. Pt takes nebulizer 2 to 3 times daily.) 360 mL 2  . isosorbide mononitrate (IMDUR) 30 MG 24 hr tablet Take 1 tablet (30 mg total) by mouth daily. 30 tablet 4  . levETIRAcetam (KEPPRA) 750 MG tablet Take 2 tablets (1,500 mg total) by mouth 2 (two) times daily. 360 tablet 4  . levofloxacin (LEVAQUIN) 500 MG tablet Take 500 mg by mouth daily.    Marland Kitchen lisinopril (PRINIVIL,ZESTRIL) 40 MG tablet Take 40 mg by mouth at bedtime.     . metoprolol tartrate (LOPRESSOR) 25 MG tablet Take 1 tablet (25 mg total) by mouth 2 (two) times daily. For systolic BP of 353 or greater take 50 mg BID (Patient taking differently: Take 50 mg by mouth 2 (two) times daily. For systolic BP of 299 or greater take 50 mg  BID) 240 tablet 1  . omeprazole (PRILOSEC) 40 MG capsule Take 40 mg by mouth daily.    . OXYGEN Inhale 2-3 L into the lungs continuous.    . pramipexole (MIRAPEX) 1 MG tablet Take 1 mg by mouth at bedtime.    . sodium chloride (OCEAN) 0.65 % SOLN nasal spray Place 1 spray into both nostrils as needed for congestion.    . traMADol (ULTRAM) 50 MG tablet Take 1 tablet (50 mg total) by mouth every 12 (twelve) hours  as needed. (Patient taking differently: Take 50 mg by mouth daily as needed (pain). ) 30 tablet 0  . triamcinolone cream (KENALOG) 0.1 % Apply 1 application topically 2 (two) times daily as needed (itching/ dry skin).     Marland Kitchen zolpidem (AMBIEN) 10 MG tablet Take 10 mg by mouth at bedtime as needed for sleep. Reported on 09/25/2015    . levofloxacin (LEVAQUIN) 500 MG tablet Take 1 tablet (500 mg total) by mouth daily. 7 tablet 0    Results for orders placed or performed during the hospital encounter of 01/06/16 (from the past 48 hour(s))  I-STAT 7, (LYTES, BLD GAS, ICA, H+H)     Status: Abnormal   Collection Time: 01/06/16  7:25 AM  Result Value Ref Range   pH, Arterial 7.433 7.350 - 7.450   pCO2 arterial 57.7 (HH) 35.0 - 45.0 mmHg   pO2, Arterial 101.0 (H) 80.0 - 100.0 mmHg   Bicarbonate 39.0 (H) 20.0 - 24.0 mEq/L   TCO2 41 0 - 100 mmol/L   O2 Saturation 98.0 %   Acid-Base Excess 12.0 (H) 0.0 - 2.0 mmol/L   Sodium 130 (L) 135 - 145 mmol/L   Potassium 4.2 3.5 - 5.1 mmol/L   Calcium, Ion 1.16 1.12 - 1.23 mmol/L   HCT 34.0 (L) 39.0 - 52.0 %   Hemoglobin 11.6 (L) 13.0 - 17.0 g/dL   Patient temperature 36.1 C    Sample type ARTERIAL    Comment NOTIFIED PHYSICIAN    No results found.  Review of Systems  Constitutional: Negative.   HENT: Negative.   Eyes: Negative.   Respiratory: Negative.   Cardiovascular: Negative.   Gastrointestinal: Negative.   Genitourinary: Negative.   Musculoskeletal: Negative.   Skin: Negative.   Neurological: Positive for tingling and sensory change.   Psychiatric/Behavioral: Negative.     There were no vitals taken for this visit. Physical Exam  Constitutional: He is oriented to person, place, and time. He appears well-developed and well-nourished.  HENT:  Head: Normocephalic.  Eyes: Pupils are equal, round, and reactive to light.  Neck: Normal range of motion.  Respiratory: Effort normal.  GI: Soft. Bowel sounds are normal.  Neurological: He is alert and oriented to person, place, and time. He has normal strength. GCS eye subscore is 4. GCS verbal subscore is 5. GCS motor subscore is 6.  Awake alert and oriented Strength is 5 out of 5 upper and lower extremities  Skin: Skin is warm and dry.     Assessment/Plan 74 year old gentleman presents for craniotomy for right-sided right occipital lung met  Vinicius Brockman P, MD 01/06/2016, 7:59 AM

## 2016-01-06 NOTE — Addendum Note (Signed)
Addendum  created 01/06/16 1342 by [place] Ellison, CRNA   Modules edited: Anesthesia Events, Narrator   Narrator:  Narrator: Event Log Edited

## 2016-01-07 ENCOUNTER — Ambulatory Visit: Payer: Medicare Other | Admitting: Radiation Oncology

## 2016-01-07 ENCOUNTER — Encounter (HOSPITAL_COMMUNITY): Payer: Self-pay | Admitting: Neurosurgery

## 2016-01-07 DIAGNOSIS — C3490 Malignant neoplasm of unspecified part of unspecified bronchus or lung: Secondary | ICD-10-CM

## 2016-01-07 LAB — TYPE AND SCREEN
ABO/RH(D): A POS
ANTIBODY SCREEN: NEGATIVE
Unit division: 0

## 2016-01-07 LAB — GLUCOSE, CAPILLARY
GLUCOSE-CAPILLARY: 123 mg/dL — AB (ref 65–99)
GLUCOSE-CAPILLARY: 166 mg/dL — AB (ref 65–99)
Glucose-Capillary: 104 mg/dL — ABNORMAL HIGH (ref 65–99)
Glucose-Capillary: 106 mg/dL — ABNORMAL HIGH (ref 65–99)
Glucose-Capillary: 85 mg/dL (ref 65–99)
Glucose-Capillary: 95 mg/dL (ref 65–99)

## 2016-01-07 LAB — PHOSPHORUS: PHOSPHORUS: 3.4 mg/dL (ref 2.5–4.6)

## 2016-01-07 LAB — MAGNESIUM: MAGNESIUM: 2 mg/dL (ref 1.7–2.4)

## 2016-01-07 LAB — PROTIME-INR
INR: 0.95 (ref 0.00–1.49)
Prothrombin Time: 12.9 seconds (ref 11.6–15.2)

## 2016-01-07 NOTE — Progress Notes (Signed)
Subjective: Patient reports Doing well no headache arms and legs feel good  Objective: Vital signs in last 24 hours: Temp:  [97.2 F (36.2 C)-97.5 F (36.4 C)] 97.4 F (36.3 C) (06/28 0814) Pulse Rate:  [50-72] 59 (06/28 0900) Resp:  [15-25] 23 (06/28 0800) BP: (95-174)/(63-106) 143/98 mmHg (06/28 0900) SpO2:  [91 %-100 %] 99 % (06/28 0900) Arterial Line BP: (154-209)/(56-96) 197/73 mmHg (06/28 0900) Weight:  [85.1 kg (187 lb 9.8 oz)] 85.1 kg (187 lb 9.8 oz) (06/28 0600)  Intake/Output from previous day: 06/27 0701 - 06/28 0700 In: 2770 [I.V.:2310; IV Piggyback:460] Out: 4944 [Urine:2850; Drains:183; Blood:200] Intake/Output this shift: Total I/O In: 75 [I.V.:75] Out: -   Patient is awake alert strength out of 5 incision has a moderate amount of bloody drainage I took out his JP drain with looked like the drainage was around the JP. There also was a little bit, the incision itself  Lab Results:  Recent Labs  01/06/16 1038 01/06/16 1217  WBC  --  5.8  HGB 8.8* 10.1*  HCT 26.0* 31.5*  PLT  --  122*   BMET  Recent Labs  01/06/16 1038 01/06/16 1217  NA 129* 131*  K 4.4 4.5  CL  --  93*  CO2  --  32  GLUCOSE  --  119*  BUN  --  19  CREATININE  --  0.83  CALCIUM  --  7.4*    Studies/Results: No results found.  Assessment/Plan: Mobilized day with physical therapy DC his A-line DC his Foley on the check coags  LOS: 1 day     Janari Gagner P 01/07/2016, 10:26 AM

## 2016-01-07 NOTE — Progress Notes (Signed)
PULMONARY / CRITICAL CARE MEDICINE   Name: [name] MRN: [id number] DOB: Dec 22, 1940    ADMISSION DATE:  01/06/2016 CONSULTATION DATE:  01/06/16  REFERRING MD:  Dr. Saintclair Halsted   CHIEF COMPLAINT:  Post-Op Tumor Removal.  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    Surgical note review reflects resection of a mass that was likely necrotic as it leaked a thick white-milky substance.  Specimens were sent for pathology as well as cultures.  JP drain was placed in the subgaleal space. He was sent to recovery intubated.  He was subsequently liberated from mechanical ventilation in PACU without difficulty.  He was returned to ICU on [place] O2.    PCCM consulted for ICU support.    PAST MEDICAL HISTORY :  He  has a past medical history of Arthritis; Wheezing; Sore throat; Carotid artery occlusion; COPD (chronic obstructive pulmonary disease) ([place]); Lumbar disc disease; Restless leg syndrome; Allergic rhinitis; Hypertension; Anxiety; Shortness of breath; Lung mass; GERD (gastroesophageal reflux disease); H/O hiatal hernia; Pre-operative cardiovascular examination; Nonspecific abnormal unspecified cardiovascular function study; Peripheral vascular disease, unspecified ([place]);  Pneumonia (2014); On home oxygen therapy; Constipation; S/P radiation therapy (09/25/14); S/P radiation therapy (12/04/14 srs); S/P radiation therapy (07/18/15); Seizures ([place]) (12/13/15); Cancer ([place]) (09/14/12); Metastasis to brain [facility]) (02/2015); History of chemotherapy; Headache; and Occasional tremors.    SUBJECTIVE:  NAD at rest  VITAL SIGNS: BP 159/88 mmHg  Pulse 67  Temp(Src) 97.4 F (36.3 C) (Oral)  Resp 23  Ht '5\' 9"'$  (1.753 m)  Wt 187 lb 9.8 oz (85.1 kg)  BMI 27.69 kg/m2  SpO2 98%  HEMODYNAMICS:    VENTILATOR SETTINGS:    INTAKE / OUTPUT: I/O last 3 completed shifts: In: 2770 [I.V.:2310; IV Piggyback:460] Out: 55 [Urine:2850; Drains:183; Blood:200]  PHYSICAL EXAMINATION: General:  Chronically ill appearing male in NAD  Neuro:  AAOx4, speech clear, MAE , mild confusion HEENT:  MM pink/moist, no jvd Cardiovascular:  s1s2 rrr, , R chest wall port Lungs:  Even/non-labored, lungs bilaterally with wheezing Abdomen:  Obese/soft, bsx4 active  Musculoskeletal:  No acute deformities  Skin:  Warm/dry, no edema   LABS:  BMET  Recent Labs Lab 01/01/16 0907 01/06/16 0725 01/06/16 1038 01/06/16 1217  NA 128* 130* 129* 131*  K 5.0 4.2 4.4 4.5  CL 90*  --   --  93*  CO2 33*  --   --  32  BUN 25*  --   --  19  CREATININE 0.83  --   --  0.83  GLUCOSE 77  --   --  119*    Electrolytes  Recent Labs Lab 01/01/16 0907 01/06/16 1217 01/07/16 0400  CALCIUM 8.0* 7.4*  --   MG  --   --  2.0  PHOS  --   --  3.4  CBC  Recent Labs Lab 01/01/16 0907 01/06/16 0725 01/06/16 1038 01/06/16 1217  WBC 9.9  --   --  5.8  HGB 10.9* 11.6* 8.8* 10.1*  HCT 33.9* 34.0* 26.0* 31.5*  PLT 146*  --   --  122*    Coag's No results for input(s): APTT, INR in the last 168 hours.  Sepsis Markers No results for input(s): LATICACIDVEN, PROCALCITON, O2SATVEN in the last 168 hours.  ABG  Recent Labs Lab 01/06/16 0725 01/06/16 1038  PHART 7.433 7.492*  PCO2ART 57.7* 45.7*   PO2ART 101.0* 176.0*    Liver Enzymes No results for input(s): AST, ALT, ALKPHOS, BILITOT, ALBUMIN in the last 168 hours.  Cardiac Enzymes No results for input(s): TROPONINI, PROBNP in the last 168 hours.  Glucose  Recent Labs Lab 01/06/16 1637 01/06/16 1959 01/06/16 2358 01/07/16 0403 01/07/16 0809  GLUCAP 92 108* 95 106* 85    Imaging No results found.   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  Returned to ICU extubated and PCCM consulted for medical management. 6/28 stable om [place].  ASSESSMENT / PLAN:  NEUROLOGIC A:   Squamous Cell Lung Cancer with Metastasis to the Brain  Seizures - secondary to above  Anxiety RLS  P:   RASS goal: n/a Continue PRN klonopin Dexamethasone taper per NSGY PRN Norco, dilaudid for pain  Continue Keppra for seizures Follow intra-op cultures / pathology  Continue mirapex  PULMONARY A: O2 Dependent GOLD 3-4 COPD Stage IV Squamous Cell Lung Cancer with Metastasis to Brain, Adrenals, Lymphangitic Spread  Former Tobacco Abuse P:   Pulmonary hygiene - IS, mobilize as able  CXR PRN  O2 to support sats 88-95% Continue guaifenesin   CARDIOVASCULAR A:  Hx HTN, PVD/CAD P:  Continue ASA,  Norvasc, Clonidine, Imdur, Lopressor, Lisinopril PRN labetalol  ICU monitoring  Assess EKG post-operatively   RENAL  Recent Labs Lab 01/06/16 0725 01/06/16 1038 01/06/16 1217  NA 130* 129* 131*    A:   Hyponatremia  P:   Trend BMP / UOP Replace electrolytes as indicated   GASTROINTESTINAL A:   Hx GERD, Hiatal Hernia P:   NPO for now Pepcid   HEMATOLOGIC A:   Anemia - suspect of chronic disease Thrombocytopenia  P:  Monitor CBC Lovenox for DVT prophylaxis   INFECTIOUS A:   S/P R Parietal Mass Resection - milky white drainage during case from mass, infection vs necrosis P:   Monitor cultures as above  ABX as above   ENDOCRINE CBG (last 3)   Recent Labs  01/06/16 2358 01/07/16 0403 01/07/16 0809  GLUCAP 95 106* 85     A:   Hyperglycemia    P:   Monitor glucose, ICU Hyperglycemia protocol     FAMILY  - Updates: No family at bedside upon assessment.    -  Inter-disciplinary family meet or Palliative Care meeting due by:  7/4   Richardson Landry Lashandra Arauz ACNP Maryanna Shape PCCM Pager [phone] till 3 pm If no answer page [phone] 01/07/2016, 9:03 AM

## 2016-01-07 NOTE — Care Management Important Message (Signed)
Important Message  Patient Details  Name: [name] MRN: [id number] Date of Birth: 1941-05-14   Medicare Important Message Given:  Yes    Loann Quill 01/07/2016, 10:06 AM

## 2016-01-07 NOTE — Progress Notes (Signed)
  Radiation Oncology         [phone]) [phone] ________________________________  Name: [name] MRN: [id number]  Date: 01/05/2016  DOB: 01-Jan-1941  End of Treatment Note  Diagnosis:   Brain metastasis     Indication for treatment:  palliative       Radiation treatment dates:   01/05/2016  Site/dose:    PTV7 Right parietal lobe target was treated using 6 Arcs to a prescription dose of 14 Gy. ExacTrac Snap verification was performed for each couch angle.   Narrative: The patient tolerated radiation treatment well.   There were no signs of acute toxicity after treatment.  Plan: The patient has completed radiation treatment. The patient will proceed with surgical resection tomorrow with Dr. Saintclair Halsted. ________________________________  ------------------------------------------------  Jodelle Gross, MD, PhD

## 2016-01-07 NOTE — Progress Notes (Signed)
  Radiation Oncology         [phone] ________________________________  Name: [name] MRN: [id number]  Date: 12/25/2015  DOB: 09-02-1940  DIAGNOSIS:     ICD-9-CM ICD-10-CM   1. Brain metastasis ([place]) 198.3 C79.31     NARRATIVE:  The patient was brought to the [place].  Identity was confirmed.  All relevant records and images related to the planned course of therapy were reviewed.  The patient freely provided informed written consent to proceed with treatment after reviewing the details related to the planned course of therapy. The consent form was witnessed and verified by the simulation staff. Intravenous access was established for contrast administration. Then, the patient was set-up in a stable reproducible supine position for radiation therapy.  A relocatable thermoplastic stereotactic head frame was fabricated for precise immobilization.  CT images were obtained.  Surface markings were placed.  The CT images were loaded into the planning software and fused with the patient's targeting MRI scan.  Then the target and avoidance structures were contoured.  Treatment planning then occurred.  The radiation prescription was entered and confirmed.  I have requested 3D planning  I have requested a DVH of the following structures: Brain stem, brain, left eye, right eye, lenses, optic chiasm, target volumes, uninvolved brain, and normal tissue.    SPECIAL TREATMENT PROCEDURE:  The planned course of therapy using radiation constitutes a special treatment procedure. Special care is required in the management of this patient for the following reasons. This treatment constitutes a Special Treatment Procedure for the following reason: High dose per fraction requiring special monitoring for increased toxicities of treatment including daily imaging.  The special nature of the planned course of radiotherapy will require increased physician supervision and oversight to ensure patient's  safety with optimal treatment outcomes.  PLAN:  The patient will receive 14 Gy in 1 fraction.   ------------------------------------------------  Jodelle Gross, MD, PhD

## 2016-01-08 ENCOUNTER — Encounter (HOSPITAL_COMMUNITY): Payer: Self-pay | Admitting: General Practice

## 2016-01-08 DIAGNOSIS — J449 Chronic obstructive pulmonary disease, unspecified: Secondary | ICD-10-CM

## 2016-01-08 LAB — GLUCOSE, CAPILLARY
GLUCOSE-CAPILLARY: 112 mg/dL — AB (ref 65–99)
GLUCOSE-CAPILLARY: 118 mg/dL — AB (ref 65–99)
Glucose-Capillary: 166 mg/dL — ABNORMAL HIGH (ref 65–99)
Glucose-Capillary: 136 mg/dL — ABNORMAL HIGH (ref 65–99)
Glucose-Capillary: 152 mg/dL — ABNORMAL HIGH (ref 65–99)
Glucose-Capillary: 106 mg/dL — ABNORMAL HIGH (ref 65–99)
Glucose-Capillary: 131 mg/dL — ABNORMAL HIGH (ref 65–99)

## 2016-01-08 MED ORDER — DEXAMETHASONE 2 MG PO TABS
2.0000 mg | ORAL_TABLET | Freq: Two times a day (BID) | ORAL | Status: DC
  Administered 2016-01-08 (×2): 2 mg via ORAL
  Filled 2016-01-08 (×3): qty 1

## 2016-01-08 NOTE — Progress Notes (Signed)
Patient ID: [name], male   DOB: 1941/07/02, 76 y.o.   MRN: [id number] Patient doing well minimal headache slightly confused and a little bit of coordination issues with the right hand  Strength, 5 wound is mildly saturated but much better  Mobilized with physical and occupational therapy wean steroids changes dressing

## 2016-01-08 NOTE — Care Management Note (Signed)
Case Management Note  Patient Details  Name: [name] MRN: [id number] Date of Birth: 02/13/1941  Subjective/Objective:    Pt underwent:  CRANIOTOMY TUMOR EXCISION WITH CURVE. He is from home with his spouse. He is active with [facility] for his home oxygen.               Action/Plan: CM following for discharge needs.   Expected Discharge Date:                  Expected Discharge Plan:     In-House Referral:     Discharge planning Services     Post Acute Care Choice:    Choice offered to:     DME Arranged:    DME Agency:     HH Arranged:    HH Agency:     Status of Service:  In process, will continue to follow  If discussed at Long Length of Stay Meetings, dates discussed:    Additional Comments:  Pollie Friar, RN 01/08/2016, 11:47 AM

## 2016-01-08 NOTE — Progress Notes (Signed)
PULMONARY / CRITICAL CARE MEDICINE   Name: [name] MRN: [id number] DOB: May 05, 1941    ADMISSION DATE:  01/06/2016 CONSULTATION DATE:  01/06/16  REFERRING MD:  Dr. Saintclair Halsted   CHIEF COMPLAINT:  Post-Op Tumor Removal.  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    Surgical note review reflects resection of a mass that was likely necrotic as it leaked a thick white-milky substance.  Specimens were sent for pathology as well as cultures.  JP drain was placed in the subgaleal space. He was sent to recovery intubated.  He was subsequently liberated from mechanical ventilation in PACU without difficulty.  He was returned to ICU on [place] O2.    PCCM consulted for ICU support.     SUBJECTIVE:  Reports breathing is near baseline.  Is a bit "wheezy" which is normal for him.  Reports he did not have a good experience in ICU.  Very upset about having to wait for urinal and suction yankauer.  Service excellence at bedside. Looks good sitting OOB in chair.   VITAL SIGNS: BP 127/85 mmHg  Pulse 76  Temp(Src) 98.1 F (36.7 C) (Oral)  Resp 20  Ht '5\' 9"'$  (1.753 m)  Wt 88.678 kg (195 lb 8 oz)  BMI 28.86 kg/m2  SpO2 100%   INTAKE / OUTPUT: I/O last 3 completed shifts: In:  3040 [P.O.:480; I.V.:2100; IV Piggyback:460] Out: 33 [Urine:850; Drains:95]  PHYSICAL EXAMINATION: General:  Chronically ill appearing male in NAD sitting OOB in chair  Neuro:  AAOx4, speech clear, MAE  HEENT:  MM pink/moist, no jvd, crani dressing stained  Cardiovascular:  s1s2 rrr, bradycardic, R chest wall port Lungs:  resps even non labored on [place], diminished throughout, exp wheeze L>R  Abdomen:  Obese/soft, bsx4 active  Musculoskeletal:  No acute deformities  Skin:  Warm/dry, no edema   LABS:  BMET  Recent Labs Lab 01/06/16 0725 01/06/16 1038 01/06/16 1217  NA 130* 129* 131*  K 4.2 4.4 4.5  CL  --   --  93*  CO2  --   --  32  BUN  --   --  19  CREATININE  --   --  0.83  GLUCOSE  --   --  119*    Electrolytes  Recent Labs Lab 01/06/16 1217 01/07/16 0400  CALCIUM 7.4*  --   MG  --  2.0  PHOS  --  3.4    CBC  Recent Labs Lab 01/06/16 0725 01/06/16 1038 01/06/16 1217  WBC  --   --  5.8  HGB 11.6* 8.8* 10.1*  HCT 34.0* 26.0* 31.5*  PLT  --   --  122*    Coag's  Recent Labs Lab 01/07/16 1052  INR 0.95    Sepsis Markers No results for input(s): LATICACIDVEN, PROCALCITON, O2SATVEN in the last  168 hours.  ABG  Recent Labs Lab 01/06/16 0725 01/06/16 1038  PHART 7.433 7.492*  PCO2ART 57.7* 45.7*  PO2ART 101.0* 176.0*    Liver Enzymes No results for input(s): AST, ALT, ALKPHOS, BILITOT, ALBUMIN in the last 168 hours.  Cardiac Enzymes No results for input(s): TROPONINI, PROBNP in the last 168 hours.  Glucose  Recent Labs Lab 01/07/16 0809 01/07/16 1150 01/07/16 1623 01/07/16 2357 01/08/16 0334 01/08/16 0745  GLUCAP 85 104* 123* 166* 112* 136*    Imaging No results found.   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  Returned to ICU extubated and PCCM consulted for medical management.    ASSESSMENT / PLAN:  Squamous Cell Lung Cancer with Metastasis to the Brain  Seizures - secondary to above  Anxiety RLS  S/P R Parietal Mass Resection - milky white drainage during case from mass, infection vs necrosis P:   Per neurosurgery  Dexamethasone taper per NSGY PRN Norco for pain  Continue Keppra for seizures Follow intra-op cultures / pathology  Continue mirapex Empiric abx as above   O2 Dependent GOLD 3-4 COPD Stage IV Squamous Cell Lung Cancer with Metastasis to Brain, Adrenals, Lymphangitic Spread  Former Tobacco Abuse P:   Pulmonary hygiene - IS, mobilize as able, add flutter  CXR PRN  O2 to support sats 88-95% Continue guaifenesin  duonebs q6h     Hx HTN, PVD/CAD P:  Continue ASA, Norvasc, Clonidine, Imdur, Lopressor, Lisinopril PRN labetalol  ICU monitoring  Assess EKG post-operatively    Hyponatremia  P:   Trend BMP / UOP Replace electrolytes as indicated    Anemia - suspect of chronic disease Thrombocytopenia  P:  Monitor CBC Lovenox for DVT prophylaxis    Will ask [facility] to follow along for medical management, PCCM signing off please call back if needed.    Nickolas Madrid, NP 01/08/2016  9:49 AM Pager: [phone] or [phone]

## 2016-01-08 NOTE — Progress Notes (Signed)
Patient arrived to 5C16 around 0000 from [place]. Patient is alert, oriented x4, no complaints of pain, surgical site is draining a small amount will continue to monitor.

## 2016-01-09 ENCOUNTER — Inpatient Hospital Stay (HOSPITAL_COMMUNITY): Payer: Medicare Other

## 2016-01-09 DIAGNOSIS — Z515 Encounter for palliative care: Secondary | ICD-10-CM

## 2016-01-09 DIAGNOSIS — Z9889 Other specified postprocedural states: Secondary | ICD-10-CM

## 2016-01-09 DIAGNOSIS — R410 Disorientation, unspecified: Secondary | ICD-10-CM

## 2016-01-09 LAB — OSMOLALITY, URINE: OSMOLALITY UR: 520 mosm/kg (ref 300–900)

## 2016-01-09 LAB — GLUCOSE, CAPILLARY
GLUCOSE-CAPILLARY: 108 mg/dL — AB (ref 65–99)
GLUCOSE-CAPILLARY: 93 mg/dL (ref 65–99)
GLUCOSE-CAPILLARY: 69 mg/dL (ref 65–99)
GLUCOSE-CAPILLARY: 89 mg/dL (ref 65–99)
Glucose-Capillary: 132 mg/dL — ABNORMAL HIGH (ref 65–99)
Glucose-Capillary: 87 mg/dL (ref 65–99)

## 2016-01-09 LAB — BASIC METABOLIC PANEL
ANION GAP: 4 — AB (ref 5–15)
ANION GAP: 5 (ref 5–15)
BUN: 23 mg/dL — ABNORMAL HIGH (ref 6–20)
BUN: 21 mg/dL — ABNORMAL HIGH (ref 6–20)
CALCIUM: 7.2 mg/dL — AB (ref 8.9–10.3)
CALCIUM: 7.4 mg/dL — AB (ref 8.9–10.3)
CO2: 32 mmol/L (ref 22–32)
CO2: 31 mmol/L (ref 22–32)
Chloride: 92 mmol/L — ABNORMAL LOW (ref 101–111)
Chloride: 94 mmol/L — ABNORMAL LOW (ref 101–111)
Creatinine, Ser: 0.86 mg/dL (ref 0.61–1.24)
Creatinine, Ser: 0.86 mg/dL (ref 0.61–1.24)
GFR calc Af Amer: >60 mL/min (ref >60)
GLUCOSE: 108 mg/dL — AB (ref 65–99)
GLUCOSE: 97 mg/dL (ref 65–99)
POTASSIUM: 4.9 mmol/L (ref 3.5–5.1)
POTASSIUM: 4.7 mmol/L (ref 3.5–5.1)
SODIUM: 128 mmol/L — AB (ref 135–145)
SODIUM: 130 mmol/L — AB (ref 135–145)

## 2016-01-09 LAB — CBC
HCT: 32.2 % — ABNORMAL LOW (ref 39.0–52.0)
Hemoglobin: 10.4 g/dL — ABNORMAL LOW (ref 13.0–17.0)
MCH: 30.4 pg (ref 26.0–34.0)
MCHC: 32.3 g/dL (ref 30.0–36.0)
MCV: 94.2 fL (ref 78.0–100.0)
Platelets: 128 10*3/uL — ABNORMAL LOW (ref 150–400)
RBC: 3.42 MIL/uL — AB (ref 4.22–5.81)
RDW: 14.9 % (ref 11.5–15.5)
WBC: 6.8 10*3/uL (ref 4.0–10.5)

## 2016-01-09 LAB — OSMOLALITY: Osmolality: 275 mOsm/kg (ref 275–295)

## 2016-01-09 LAB — SODIUM, URINE, RANDOM: Sodium, Ur: 47 mmol/L

## 2016-01-09 MED ORDER — SODIUM CHLORIDE 1 G PO TABS
2.0000 g | ORAL_TABLET | Freq: Three times a day (TID) | ORAL | Status: DC
  Administered 2016-01-09 – 2016-01-16 (×21): 2 g via ORAL
  Filled 2016-01-09 (×23): qty 2

## 2016-01-09 MED ORDER — HYDROCODONE-ACETAMINOPHEN 7.5-325 MG PO TABS
0.5000 | ORAL_TABLET | Freq: Three times a day (TID) | ORAL | Status: DC | PRN
  Administered 2016-01-09 – 2016-01-15 (×7): 0.5 via ORAL
  Filled 2016-01-09 (×7): qty 1

## 2016-01-09 MED ORDER — DEXAMETHASONE 4 MG PO TABS
4.0000 mg | ORAL_TABLET | Freq: Four times a day (QID) | ORAL | Status: DC
  Administered 2016-01-09 – 2016-01-10 (×3): 4 mg via ORAL
  Filled 2016-01-09 (×3): qty 1

## 2016-01-09 MED ORDER — FAMOTIDINE 20 MG PO TABS
20.0000 mg | ORAL_TABLET | Freq: Two times a day (BID) | ORAL | Status: DC
  Administered 2016-01-09 – 2016-01-10 (×3): 20 mg via ORAL
  Filled 2016-01-09 (×3): qty 1

## 2016-01-09 MED ORDER — LABETALOL HCL 5 MG/ML IV SOLN
10.0000 mg | INTRAVENOUS | Status: DC | PRN
  Administered 2016-01-15 (×2): 10 mg via INTRAVENOUS
  Filled 2016-01-09 (×2): qty 4

## 2016-01-09 MED ORDER — ACETAMINOPHEN 325 MG PO TABS
650.0000 mg | ORAL_TABLET | Freq: Four times a day (QID) | ORAL | Status: DC | PRN
  Administered 2016-01-10 – 2016-01-16 (×2): 650 mg via ORAL
  Filled 2016-01-09 (×2): qty 2

## 2016-01-09 MED ORDER — INSULIN ASPART 100 UNIT/ML ~~LOC~~ SOLN
0.0000 [IU] | Freq: Three times a day (TID) | SUBCUTANEOUS | Status: DC
  Administered 2016-01-10: 2 [IU] via SUBCUTANEOUS
  Administered 2016-01-10: 1 [IU] via SUBCUTANEOUS
  Administered 2016-01-11: 3 [IU] via SUBCUTANEOUS
  Administered 2016-01-12: 2 [IU] via SUBCUTANEOUS
  Administered 2016-01-12 – 2016-01-15 (×4): 1 [IU] via SUBCUTANEOUS
  Administered 2016-01-15 – 2016-01-16 (×2): 2 [IU] via SUBCUTANEOUS

## 2016-01-09 NOTE — Progress Notes (Signed)
Rehab Admissions Coordinator Note:  Patient was screened by Cleatrice Burke for appropriateness for an Inpatient Acute Rehab Consult per PT recommendation.   At this time, we are recommending await progress with therapy and recommend OT evaluation.Cleatrice Burke 01/09/2016, 10:55 AM  I can be reached at [phone].

## 2016-01-09 NOTE — Evaluation (Signed)
Occupational Therapy Evaluation Patient Details Name: [name] MRN: [id number] DOB: [birth year] Today's Date: 01/09/2016    History of Present Illness Patient is a 75 y/o male with recurrent lung ca with brain mets, COPD, restless leg syndrome, anxiety presents s/p craniotomy with resection of a right occipital mass.   Clinical Impression   PT admitted with CVA. Pt currently with functional limitiations due to the deficits listed below (see OT problem list). PTA was driving and completing ADLS independently.  Pt will benefit from skilled OT to increase their independence and safety with adls and balance to allow discharge CIR. Pt will need (A) due to cognitive deficits and visual deficits following d/c from hospital.      Follow Up Recommendations  CIR;Supervision/Assistance - 24 hour    Equipment Recommendations  Other (comment) (defer to CIR)    Recommendations for Other Services Rehab consult;Other (comment) (palliative medicine)     Precautions / Restrictions Precautions Precautions: Fall Precaution Comments: left visual field deficits; left inattention      Mobility Bed Mobility               General bed mobility comments: declined OOB   Transfers                 General transfer comment: in bed on arrival . Limited mobility limiting full assessment    Balance Overall balance assessment: Needs assistance Sitting-balance support: Feet supported;No upper extremity supported Sitting balance-Leahy Scale: Fair     Standing balance support: During functional activity Standing balance-Leahy Scale: Poor Standing balance comment: Requires UE support in standing due to unsteadiness and posterior lean.                            ADL Overall ADL's : Needs assistance/impaired Eating/Feeding: Moderate assistance;Bed level Eating/Feeding Details (indicate cue type and reason): pt looking for food on R side only and not tracking to midline. pt  needed cues to turn head to the L and then to scan R to locate food. pt needed food very close to face and on R side to continue self feeding. when food moved L pt need cues to locate it again.  Grooming: Wash/dry face;Sitting;Min guard                                 General ADL Comments: Pt declines OOB. Ot arriving twice to complete evaluation due to patient wanting to finish meal and then due to fatigue. pt complete visual scanning exercise and cognitive written drawing of a clock. Pt with poor return demo. pt with lack of awareness to visual and cognitive deficits. pt will need 24/7 (A) and (A) for safety     Vision Vision Assessment?: Yes Ocular Range of Motion: Impaired-to be further tested in functional context Tracking/Visual Pursuits: Requires cues, head turns, or add eye shifts to track;Unable to hold eye position out of midline Visual Fields: Left visual field deficit Additional Comments: Pt reports vision blurry in R eye and more blurry in L eye. pt reports cataract surg repair to L eye but its worse than R visually to use now. pt reports glasses that dont work anymore. pt with L inattention and L visual field deficits. 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 LLE Sensation: decreased light touch       Communication Communication Communication: No difficulties   Cognition Arousal/Alertness: Awake/alert Behavior During Therapy: WFL for tasks assessed/performed Overall Cognitive Status: Impaired/Different from baseline Area of  Impairment: Orientation;Awareness;Problem solving;Following commands;Safety/judgement;Memory Orientation Level: Time   Memory: Decreased recall of precautions Following Commands: Follows one step commands with increased time;Follows one step commands inconsistently Safety/Judgement: Decreased awareness of safety;Decreased awareness of deficits Awareness: Intellectual Problem Solving: Slow processing;Decreased initiation General Comments: pt with no awareness to visual deficits. pt with poor return demo of written exercises. tp unable to write numbers on clock . pt listing numbers out of order, omitting numbers and no awareness to errors.    General Comments       Exercises       Shoulder Instructions      Home Living Family/patient expects to be discharged to:: Private residence Living Arrangements: Spouse/significant other Available Help at Discharge: Family;Available 24 hours/day Type of Home: House Home Access: Stairs to enter [employer] of Steps: 3 Entrance Stairs-Rails: None Home Layout: One level     Bathroom Shower/Tub: Teacher, early years/pre: Handicapped height     Home Equipment: Environmental consultant - 2 wheels;Cane - single point;Hospital bed          Prior Functioning/Environment Level of Independence: Independent        Comments: Drives, cares for self. Wife has cameras around house to be able to see pt esp when he is on the porch. Wears 3L/min 02 at baseline at home. Household ambulator.    OT Diagnosis: Generalized weakness;Cognitive deficits;Disturbance of vision   OT Problem List: Decreased strength;Decreased activity tolerance;Impaired balance (sitting and/or standing);Decreased cognition;Decreased safety awareness;Decreased knowledge of use of DME or AE;Decreased knowledge of precautions;Decreased coordination;Impaired vision/perception   OT Treatment/Interventions: Self-care/ADL training;Therapeutic exercise;Neuromuscular education;DME  and/or AE instruction;Therapeutic activities;Cognitive remediation/compensation;Visual/perceptual remediation/compensation;Patient/family education;Balance training    OT Goals(Current goals can be found in the care plan section) Acute Rehab OT Goals Patient Stated Goal: none stated OT Goal Formulation: Patient unable to participate in goal setting Time For Goal Achievement: 01/23/16 Potential to Achieve Goals: Good  OT Frequency: Min 2X/week   Barriers to D/C:            Co-evaluation              End of Session Nurse Communication: Mobility status;Precautions  Activity Tolerance: Patient limited by fatigue Patient left: in bed;with call bell/phone within reach;with bed alarm set   Time: 4270-6237 ([id number]) OT Time Calculation (min): 11 min Charges:  OT General Charges $OT Visit: 1 Procedure OT Evaluation $OT Eval Moderate Complexity: 1 Procedure G-Codes:    Parke Poisson B 01-17-16, 2:39 PM   Jeri Modena   OTR/L Pager: [phone] Office: [phone] .

## 2016-01-09 NOTE — Clinical Social Work Note (Signed)
Clinical Social Work Assessment  Patient Details  Name: [name] MRN: [id number] Date of Birth: 08/18/1940  Date of referral:  01/09/16               Reason for consult:  Facility Placement                Permission sought to share information with:  Facility Contact Representative, Family Supports Permission granted to share information::  Yes, Verbal Permission Granted  Name::     [name]  Agency::  SNFs  Relationship::  Spouse  Contact Information:  [phone]  Housing/Transportation Living arrangements for the past 2 months:  Single Family Home Source of Information:  Patient, Spouse, Adult Children Patient Interpreter Needed:  None Criminal Activity/Legal Involvement Pertinent to Current Situation/Hospitalization:  No - Comment as needed Significant Relationships:  Adult Children, Spouse Lives with:  Spouse Do you feel safe going back to the place where you live?  No Need for family participation in patient care:  Yes (Comment)  Care giving concerns:  CSW received referral for possible SNF placement at time of discharge if CIR is unable to admit. CSW met with patient and patient's wife at bedside regarding PT recommendation of SNF placement at time of discharge. Per patient's wife, patient's wife is currently unable to care for patient at their home given patient's current physical needs and fall risk. Patient and patient's wife expressed understanding of PT recommendation and may be agreeable to SNF placement at time of discharge. CSW to continue to follow and assist with discharge planning needs.   Social Worker assessment / plan:  CSW spoke with patient and patient's wife concerning possibility of rehab at SNF before returning home.  Employment status:  Retired Insurance information:  Medicare PT Recommendations:  Inpatient Rehab Consult, Skilled Nursing Facility Information / Referral to community resources:  Skilled Nursing Facility  Patient/Family's Response to  care:  Patient's spouse recognizes need for rehab before returning home and is agreeable to a SNF if CIR is unable to admit but patient thinks he can go home. Wife is opposed to this idea (she is physically unable to get him up) Patient reported preference for [facility] or [facility].  Patient/Family's Understanding of and Emotional Response to Diagnosis, Current Treatment, and Prognosis:  Patient is realistic regarding therapy needs. No questions/concerns about plan or treatment.    Emotional Assessment Appearance:  Appears stated age Attitude/Demeanor/Rapport:  Other (Appropriate) Affect (typically observed):  Frustrated Orientation:  Oriented to Self, Oriented to Place, Oriented to  Time, Oriented to Situation Alcohol / Substance use:  Not Applicable Psych involvement (Current and /or in the community):  No (Comment)  Discharge Needs  Concerns to be addressed:  Care Coordination, Home Safety Concerns (Wife unable to help him) Readmission within the last 30 days:  No Current discharge risk:  None Barriers to Discharge:  Continued Medical Work up   Nadia S Rayyan, LCSWA 01/09/2016, 1:51 PM  

## 2016-01-09 NOTE — Progress Notes (Signed)
Subjective: Patient reports Doing well no headache still with a moderate amount of confusion wife reports there may be an issue with his vision.  Objective: Vital signs in last 24 hours: Temp:  [97.6 F (36.4 C)-98.2 F (36.8 C)] 98.1 F (36.7 C) (06/30 0502) Pulse Rate:  [69-89] 76 (06/30 0502) Resp:  [18] 18 (06/30 0502) BP: (109-164)/(62-84) 135/84 mmHg (06/30 0502) SpO2:  [96 %-100 %] 100 % (06/30 0844)  Intake/Output from previous day: 06/29 0701 - 06/30 0700 In: 240 [P.O.:240] Out: -  Intake/Output this shift:    Awake and alert strength seems 5 out of 5 he has a little bit of apraxia of the right upper extremity and he apparently also has a left homonymous hemianopsia.  Lab Results:  Recent Labs  01/06/16 1038 01/06/16 1217  WBC  --  5.8  HGB 8.8* 10.1*  HCT 26.0* 31.5*  PLT  --  122*   BMET  Recent Labs  01/06/16 1217 01/09/16 0335  NA 131* 128*  K 4.5 4.9  CL 93* 92*  CO2 32 32  GLUCOSE 119* 108*  BUN 19 23*  CREATININE 0.83 0.86  CALCIUM 7.4* 7.2*    Studies/Results: No results found.  Assessment/Plan: 75 year old woman now 3 days out from a craniotomy for resection of a right occipital mass. Pathology still pending patient seems to be doing fairly well for rest. Perspective still moderately confused got out of bed fell this morning cut his arm. He does have a visual field cut off the left not unexpected with the location of his lesion and surgery.  Ordered follow-up blood work his sodium is a little on the low [place] been chronically low and start him on salt replacement tablets. I've ordered a CT scan of his head he is getting a breathing treatment and then a consult the medical folks to come see him and the make sure that we are covering all bases from medical perspective.  LOS: 3 days     Reeder Brisby P 01/09/2016, 9:07 AM

## 2016-01-09 NOTE — NC FL2 (Signed)
[place] LEVEL OF CARE SCREENING TOOL     IDENTIFICATION  Patient Name: [name] Birthdate: 03-19-1941 Sex: male Admission Date (Current Location): 01/06/2016  [facility] and [place] Number:  Herbalist and Address:  [facility], [place] [address], [place], [place] [address]      Provider Number: [id number]  Attending Physician Name and Address:  Kary Kos, MD  Relative Name and Phone Number:  [name], spouse, [phone]    Current Level of Care: Hospital Recommended Level of Care: Rafael Gonzalez Prior Approval Number:    Date Approved/Denied:   PASRR Number: [id number]  Discharge Plan: SNF    Current Diagnoses: Patient Active Problem List   Diagnosis Date Noted  . Palliative care encounter   . S/P craniotomy 01/06/2016  . Acute on chronic respiratory failure ([place]) 01/06/2016  . COPD exacerbation ([place]) 12/30/2015  . Acute exacerbation of COPD with asthma ([place]) 12/30/2015  . Acute exacerbation of CHF (congestive heart failure) ([place]) 12/30/2015  . Leukocytosis 12/30/2015  . COPD mixed type ([place]) 12/25/2015  . Edema 12/25/2015  . New onset seizure ([place])   . Malignant neoplasm of lung ([place])   . Benign essential HTN   . Acute left hemiparesis ([place])   . Left hemiparesis ([place]) 12/13/2015  . Respiratory failure ([place]) 12/13/2015  . Brain metastases ([place])   . Volume overload 09/26/2015  . Hand foot syndrome 09/26/2015  . Malignant neoplasm of hilus of lung ([place]) 09/25/2015  . Unintentional weight loss 09/10/2015  . Hypertension 09/10/2015  . Encounter for antineoplastic chemotherapy 07/20/2015  . 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 Metastatic lung cancer (metastasis from lung to other site) [facility])   . Poor venous access   . Epistaxis 05/30/2015  . Extravasation, infusion or chemotherapeutic agent 05/09/2015  . Hypokalemia 04/02/2015  . Constipation 01/29/2015  . Insomnia 01/29/2015   . Cancer associated pain 12/30/2014  . Rash 12/30/2014  . Syncope 12/30/2014  . Encounter for antineoplastic immunotherapy 12/16/2014  . UTI (lower urinary tract infection) 11/11/2014  . Sepsis ([place]) 11/11/2014  . Left arm cellulitis   . Blood poisoning ([place])   . Dehydration 10/12/2014  . Renal stone 10/05/2014  . Pyelonephritis 10/05/2014  . Chemotherapy induced neutropenia ([place]) 09/18/2014  . Dyspnea 09/11/2014  . Brain metastasis ([place]) 09/11/2014  . Tachycardia 06/21/2014  . Carotid stenosis 05/01/2014  . Aftercare following surgery of the circulatory system 05/01/2014  . Weakness-Hand and  Legs 05/01/2014  . Coronary atherosclerosis of native coronary artery 03/07/2014  . Essential hypertension, benign 03/07/2014  . Tobacco use disorder 03/07/2014  . 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 Peripheral vascular disease, unspecified ([place])   . COPD (chronic obstructive pulmonary disease) ([place]) 11/16/2011  . Occlusion and stenosis of carotid artery without mention of cerebral infarction 05/12/2011    Orientation RESPIRATION BLADDER Height & Weight     Self, Time, Situation, Place  O2 (Nasal Cannula 2L) Continent Weight: 88.678 kg (195 lb 8 oz) Height:  '5\' 9"'$  (175.3 cm)  BEHAVIORAL SYMPTOMS/MOOD NEUROLOGICAL BOWEL NUTRITION STATUS      Continent Diet (Please see DC summary)  AMBULATORY STATUS COMMUNICATION OF NEEDS Skin   Limited Assist Verbally Surgical wounds (Closed incision on head; wound on elbow)                       Personal Care Assistance Level of Assistance  Bathing,  Feeding, Dressing Bathing Assistance: Maximum assistance Feeding assistance: Independent Dressing Assistance: Limited assistance     Functional Limitations Info             SPECIAL CARE FACTORS FREQUENCY  PT (By licensed PT)     PT Frequency: 5x/week              Contractures      Additional Factors Info  Code Status, Allergies, Insulin Sliding Scale Code  Status Info: Full Allergies Info: NKA   Insulin Sliding Scale Info: insulin aspart (novoLOG) injection 2-6 Units       Current Medications (01/09/2016):  This is the current hospital active medication list Current Facility-Administered Medications  Medication Dose Route Frequency Provider Last Rate Last Dose  . amLODipine (NORVASC) tablet 5 mg  5 mg Oral Daily Kary Kos, MD   5 mg at 01/09/16 1040  . aspirin EC tablet 81 mg  81 mg Oral Daily Donita Brooks, NP   81 mg at 01/09/16 1039  . budesonide (PULMICORT) nebulizer solution 0.25 mg  0.25 mg Nebulization BID Brand Males, MD   0.25 mg at 01/09/16 0844  . clonazePAM (KLONOPIN) tablet 0.5 mg  0.5 mg Oral BID PRN Kary Kos, MD   0.5 mg at 01/08/16 2143  . cloNIDine (CATAPRES) tablet 0.1 mg  0.1 mg Oral BID Kary Kos, MD   0.1 mg at 01/09/16 1040  . dexamethasone (DECADRON) tablet 4 mg  4 mg Oral Q6H Kary Kos, MD      . famotidine (PEPCID) tablet 20 mg  20 mg Oral BID Kary Kos, MD   20 mg at 01/09/16 1039  . furosemide (LASIX) tablet 40 mg  40 mg Oral Daily Donita Brooks, NP   40 mg at 01/09/16 1040  . guaiFENesin tablet 400 mg  400 mg Oral BID Kary Kos, MD   400 mg at 01/09/16 1039  . HYDROcodone-acetaminophen (NORCO) 7.5-325 MG per tablet 1 tablet  1 tablet Oral Q6H PRN Kary Kos, MD   1 tablet at 01/09/16 1039  . insulin aspart (novoLOG) injection 2-6 Units  2-6 Units Subcutaneous Q4H Donita Brooks, NP   2 Units at 01/09/16 1241  . ipratropium-albuterol (DUONEB) 0.5-2.5 (3) MG/3ML nebulizer solution 3 mL  3 mL Nebulization QID Kary Kos, MD   3 mL at 01/09/16 1203  . isosorbide mononitrate (IMDUR) 24 hr tablet 30 mg  30 mg Oral Daily Kary Kos, MD   30 mg at 01/09/16 1039  . labetalol (NORMODYNE,TRANDATE) injection 10-40 mg  10-40 mg Intravenous Q10 min PRN Kary Kos, MD      . levETIRAcetam (KEPPRA) tablet 1,500 mg  1,500 mg Oral BID Kary Kos, MD   1,500 mg at 01/09/16 1040  . levofloxacin (LEVAQUIN) tablet 500 mg  500 mg Oral  Daily Kary Kos, MD   500 mg at 01/09/16 1039  . metoprolol (LOPRESSOR) tablet 50 mg  50 mg Oral BID Kary Kos, MD   50 mg at 01/09/16 1040  . ondansetron (ZOFRAN) tablet 4 mg  4 mg Oral Q4H PRN Kary Kos, MD       Or  . ondansetron [facility]) injection 4 mg  4 mg Intravenous Q4H PRN Kary Kos, MD   4 mg at 01/08/16 1310  . pramipexole (MIRAPEX) tablet 1 mg  1 mg Oral QHS Kary Kos, MD   1 mg at 01/09/16 0005  . promethazine (PHENERGAN) tablet 12.5-25 mg  12.5-25 mg Oral Q4H PRN Kary Kos, MD      .  sodium chloride (OCEAN) 0.65 % nasal spray 1 spray  1 spray Each Nare PRN Kary Kos, MD      . sodium chloride tablet 2 g  2 g Oral TID WC Kary Kos, MD   2 g at 01/09/16 1242   Facility-Administered Medications Ordered in Other Encounters  Medication Dose Route Frequency Provider Last Rate Last Dose  . sodium chloride 0.9 % injection 10 mL  10 mL Intracatheter PRN Curt Bears, MD   10 mL at 01/29/15 9735     Discharge Medications: Please see discharge summary for a list of discharge medications.  Relevant Imaging Results:  Relevant Lab Results:   Additional Information SSN: [place], [place]

## 2016-01-09 NOTE — Care Management Important Message (Signed)
Important Message  Patient Details  Name: [name] MRN: [id number] Date of Birth: 02/28/1941   Medicare Important Message Given:  Yes    Nathen May 01/09/2016, 12:10 PM

## 2016-01-09 NOTE — Progress Notes (Addendum)
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 Post surgery, admitted to ICU and CCM provided medical consultation. Transfer to floor on 6/29 with TRH assuming consultation services.   Assessment & Plan:   Active Problems:   Metastatic lung cancer (metastasis from lung to other site) [facility])   S/P craniotomy   Acute on chronic respiratory failure (HCC)   Palliative care encounter   S/P R Parietal Mass Resection - milky white drainage during case from mass, infection vs necrosis -Management per neurosurgery/primary service. - Currently on dexamethasone and on Keppra for seizures. Remains on empiric levofloxacin. - Intra-Op cultures negative to date. - Repeat head CT 6/30: New 4.5 cm right parietal hematoma. Mild surrounding edema without midline shift.  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  RLS - Continue Mirapex.  Oxygen dependent and GOLD 3-4 COPD/chronic hypoxic respiratory failure - On home oxygen between 2-3 L/m continuously. Former tobacco abuse history. - Continue pulmonary toilet. - Seems stable.  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 and Dr. Lisbeth Renshaw - Palliative care consulted by CCM prior to moving out of ICU.  Essential hypertension - Mildly uncontrolled. Currently on amlodipine, clonidine, Imdur, metoprolol. Lisinopril discontinued this admission.  PVD/CAD - Remains on aspirin-decision regarding stopping due to brain hematoma deferred to neurosurgery.  Hyponatremia - ? SIADH. Periodically follow BMP. Agree with salt tablets.  Anemia of chronic disease - Stable.  Thrombocytopenia - Stable  Confusion - May be multifactorial related to brain surgery, brain metastases, new brain hematoma, acute illness, hospitalization, pain medications. Hyponatremia seems stable and not very low as cause for his confusion. Treat the treatable. Minimize pain/sedative medications. Sleep hygiene.  Hyperglycemia - Well-controlled. Currently on SSI.   DVT prophylaxis: SCD's Code Status: Full Family Communication: Discussed with spouse and extended family at bedside. Disposition Plan: to be determined.   Consultants:   CCM- signed off  Palliative Team  [facility] are consultants  Procedures:   Stereotactic Radiosurgery for solitary brain metastasis on 01/05/16  Extubated  Antimicrobials:   Cefazolin 1 dose 6/27.  Levofloxacin 6/27 >.    Subjective: Seen this morning after he returned from CT head. Denied complaints. Denied headache. As per spouse, slightly confused and issues with vision or looking to the left. Per neurosurgery, fell out of bed this morning and cut his arm.  Objective:  Filed Vitals:   01/09/16 0842 01/09/16 0844 01/09/16 1034 01/09/16 1342  BP:   161/71 158/80  Pulse:   75 78  Temp:   98.3 F (36.8 C) 98.9 F (37.2 C)  TempSrc:   Oral Oral  Resp:   19 18  Height:      Weight:      SpO2:  100% 100% 100% 100%    Intake/Output Summary (Last 24 hours) at 01/09/16 1417 Last data filed at 01/09/16 2353  Gross per 24 hour  Intake    240 ml  Output       0 ml  Net    240 ml   Filed Weights   01/07/16 0600 01/07/16 2348  Weight: 85.1 kg (187 lb 9.8 oz) 88.678 kg (195 lb 8 oz)    Examination:  General exam: Pleasant elderly male, frail and chronically ill-looking lying comfortably supine in bed. Respiratory system: Clear to auscultation. Respiratory effort normal. Cardiovascular system: S1 & S2 heard, RRR. No JVD, murmurs, rubs, gallops or clicks. 1+ pitting bilateral pedal edema. Not on telemetry. Gastrointestinal system: Abdomen is nondistended, soft and nontender. No organomegaly or masses felt. Normal bowel sounds heard. Central nervous system: Alert and oriented to person and partly to place. No focal neurological deficits. Extremities: Symmetric 5 x 5 power. Skin: No rashes, lesions or ulcers Psychiatry: Judgement and insight appear poor. Mood & affect are flat.     Data Reviewed: I have personally reviewed following labs and imaging studies  CBC:  Recent Labs Lab 01/06/16 0725 01/06/16 1038 01/06/16 1217 01/09/16 0840  WBC  --   --  5.8 6.8  HGB 11.6* 8.8* 10.1* 10.4*  HCT 34.0* 26.0* 31.5* 32.2*  MCV  --   --  92.9 94.2  PLT  --   --  122* 614*   Basic Metabolic Panel:  Recent Labs Lab 01/06/16 0725 01/06/16 1038 01/06/16 1217 01/07/16 0400 01/09/16 0335 01/09/16 0840  NA 130* 129* 131*  --  128* 130*  K 4.2 4.4 4.5  --  4.9 4.7  CL  --   --  93*  --  92* 94*  CO2  --   --  32  --  32 31  GLUCOSE  --   --  119*  --  108* 97  BUN  --   --  19  --  23* 21*  CREATININE  --   --  0.83  --  0.86 0.86  CALCIUM  --   --  7.4*  --  7.2* 7.4*  MG  --   --   --  2.0  --   --   PHOS  --   --   --  3.4  --   --    GFR: Estimated Creatinine Clearance: 85.5 mL/min (by C-G formula based on Cr of 0.86). Liver Function Tests: No results for input(s): AST, ALT, ALKPHOS, BILITOT, PROT, ALBUMIN in the last 168 hours. No results for input(s): LIPASE, AMYLASE in the last 168 hours. No results for input(s): AMMONIA in  the last 168 hours. Coagulation Profile:  Recent Labs Lab 01/07/16 1052  INR 0.95   Cardiac Enzymes: No results for input(s): CKTOTAL, CKMB, CKMBINDEX, TROPONINI in the last 168 hours. BNP (last 3 results) No results for input(s): PROBNP in the last 8760 hours. HbA1C: No results for input(s): HGBA1C in the last 72 hours. CBG:  Recent Labs Lab 01/08/16 1945 01/08/16 2330 01/09/16 0343 01/09/16 0754 01/09/16 1232  GLUCAP 131* 118* 108* 93 132*   Lipid Profile: No results for input(s): CHOL, HDL, LDLCALC, TRIG, CHOLHDL, LDLDIRECT in the last 72 hours. Thyroid Function Tests: No results for input(s): TSH, T4TOTAL, FREET4, T3FREE, THYROIDAB in the last 72 hours. Anemia Panel: No results for input(s): VITAMINB12, FOLATE, FERRITIN, TIBC, IRON, RETICCTPCT in the last 72 hours.  Sepsis Labs: No results  for input(s): PROCALCITON, LATICACIDVEN in the last 168 hours.  Recent Results (from the past 240 hour(s))  Culture, sputum-assessment     Status: None   Collection Time: 12/30/15  3:56 PM  Result Value Ref Range Status   Specimen Description SPUTUM  Final   Special Requests NONE  Final   Sputum evaluation   Final    MICROSCOPIC FINDINGS SUGGEST THAT THIS SPECIMEN IS NOT REPRESENTATIVE OF LOWER RESPIRATORY SECRETIONS. PLEASE RECOLLECT. Gram Stain Report Called to,Read Back By and Verified With: ERICA PELLETIER RN 6.20.17 @ 1640 BY RICEJ    Report Status 12/30/2015 FINAL  Final  Aerobic/Anaerobic Culture (surgical/deep wound)     Status: None (Preliminary result)   Collection Time: 01/06/16 10:10 AM  Result Value Ref Range Status   Specimen Description WOUND  Final   Special Requests RIGHT PARIETAL OCCIPITAL MASS  Final   Gram Stain   Final    MODERATE WBC PRESENT, PREDOMINANTLY MONONUCLEAR NO ORGANISMS SEEN Gram Stain Report Called to,Read Back By and Verified With: G CRAM,MD AT 1116 BY L BENFIELD    Culture   Final    NO GROWTH 2 DAYS NO ANAEROBES ISOLATED; CULTURE IN  PROGRESS FOR 5 DAYS   Report Status PENDING  Incomplete         Radiology Studies: Ct Head Wo Contrast  01/09/2016  CLINICAL DATA:  Sudden onset of acute confusion and severe headache. Recent craniotomy for metastatic lung cancer. EXAM: CT HEAD WITHOUT CONTRAST TECHNIQUE: Contiguous axial images were obtained from the base of the skull through the vertex without intravenous contrast. COMPARISON:  Brain MRI 12/25/2015 FINDINGS: Sequelae of interval right parietal craniotomy are identified. There is a 4.5 x 2.8 x 4.0 cm hematoma in the right parietal lobe deep to the resection site. There is mild surrounding vasogenic edema with regional sulcal effacement but no midline shift. There is a small extra-axial collection/hematoma subjacent to the craniotomy which measures up to 7 mm in thickness. Elsewhere, there is no evidence of acute infarct or mass. The small metastases in the left frontal lobe and right cerebellum on MRI are not clearly seen on this noncontrast CT. There is mild global cerebral atrophy. Periventricular white matter hypodensities are nonspecific but compatible with mild chronic small vessel ischemic disease. Prior right cataract extraction is noted. Skin staples and swelling are noted in the right parietal scalp. Small amount of secretions are present in the right sphenoid sinus. There is a trace left mastoid effusion. Calcified atherosclerosis is noted at the skullbase. IMPRESSION: Interval postoperative changes with new 4.5 cm right parietal hematoma. Mild surrounding edema without midline shift. Critical Value/emergent results were called by telephone at the time of interpretation on 01/09/2016 at 10:22 am to Dr. Kary Kos , who verbally acknowledged these results. Electronically Signed   By: Logan Bores M.D.   On: 01/09/2016 10:27        Scheduled Meds: . amLODipine  5 mg Oral Daily  . aspirin EC  81 mg Oral Daily  . budesonide (PULMICORT) nebulizer solution  0.25 mg  Nebulization BID  . cloNIDine  0.1 mg Oral BID  . dexamethasone  4 mg Oral Q6H  . famotidine  20 mg Oral BID  . furosemide  40 mg Oral Daily  . guaiFENesin  400 mg Oral BID  . insulin aspart  2-6 Units Subcutaneous Q4H  . ipratropium-albuterol  3 mL Nebulization QID  . isosorbide mononitrate  30 mg Oral Daily  . levETIRAcetam  1,500 mg Oral  BID  . levofloxacin  500 mg Oral Daily  . metoprolol tartrate  50 mg Oral BID  . pramipexole  1 mg Oral QHS  . sodium chloride  2 g Oral TID WC   Continuous Infusions:    LOS: 3 days    Time spent: 40 minutes.    [facility], MD [facility] Pager [phone]  If 7PM-7AM, please contact night-coverage [email or web address] Password [email or web address] 01/09/2016, 2:17 PM

## 2016-01-09 NOTE — Progress Notes (Signed)
Writer called in to patient's room that he was found sitting on the floor during shift change. Writer was waiting for outgoing nurse to give report on patient. Noted a skin tear to left elbow which was cleaned and steri strips applied with gauze and wrapped with kerlex dressing. Patient noted to be confused as he was holding his call light and stating that he could not reach it. MD notified and orders placed and carried out.

## 2016-01-09 NOTE — Consult Note (Signed)
Consultation Note Date: 01/09/2016   Patient Name: [name]  DOB: Aug 23, 1940  MRN: [id number]  Age / Sex: 75 y.o., male  PCP: [name], PA-C Referring Physician: Kary Kos, MD  Reason for Consultation: Establishing goals of care  HPI/Patient Profile: 75 y.o. male  with past medical history of Arthritis, carotid artery occlusion, severe COPD, restless leg syndrome, lumbar disc disease, hypertension, GERD, peripheral vascular disease, cancer (initially diagnosed in 2014), now with return of disease to brain admitted on 01/06/2016 with left-sided weakness and tonic-clonic seizures that required intubation. He underwent craniotomy on 6/27.Marland Kitchen He is alert and oriented to himself and generally his situation. He is confused, irritable.  Clinical Assessment and Goals of Care: Patient is alert and oriented to himself and his situation. He has left-sided neglect. He has notable shortness of breath both at rest as well as with exercise. His wife is at the bedside. Patient has been very focused on aggressive care including craniotomy that was performed. His oncologist is Dr. Earlie Server and per patient and spouse, he is interested in chemotherapy and radiation or other treatments even palliative that would help extend his life. I did address pathophysiology related to squamous cell carcinoma stage IV in the setting of severe COPD. We also discussed CODE STATUS and further advance directive wishes. Spouse is struggling to care for patient at home and is hopeful that he will agree to rehabilitation  NEXT OF KIN wife [name] 1 daughter [name]    SUMMARY OF RECOMMENDATIONS   Remain full code Patient reports that he would elect to be reintubated during this hospitalization Would pursue chemotherapy and radiation if offered in beneficial Wife and patient hopeful for CIR Code Status/Advance Care Planning:  Full  code    Symptom Management:   Pain: Complaining only of a mild headache. Continue with Tylenol when necessary  Dyspnea: Continue with targeted pulmonary treatments, O2 as needed, nebulizer treatments. If patient did require opioids secondary to worsening dyspnea, would recommend low-dose oral morphine concentrate 5-10 mg every 4 when necessary  Palliative Prophylaxis:   Aspiration, Bowel Regimen, Delirium Protocol, Frequent Pain Assessment and Turn Reposition  Additional Recommendations (Limitations, Scope, Preferences):  Full Scope Treatment  Psycho-social/Spiritual:   Desire for further Chaplaincy support:no  Additional Recommendations: Grief/Bereavement Support  Prognosis:   < 6 months. DId convey to wife he was eligible for hopice. She reports he is very opposed to hopsice  Discharge Planning:  Hopeful for CIR; then home     Primary Diagnoses: Present on Admission:  . Metastatic lung cancer (metastasis from lung to other site) [facility])  I have reviewed the medical record, interviewed the patient and family, and examined the patient. The following aspects are pertinent.  Past Medical History  Diagnosis Date  . Arthritis   . Wheezing   . Sore throat   . Carotid artery occlusion   . COPD (chronic obstructive pulmonary disease) ([place])   . Lumbar disc disease   . Restless leg syndrome   . Allergic rhinitis   .  Hypertension   . Anxiety     anxiety  . Shortness of breath   . Lung mass   . GERD (gastroesophageal reflux disease)   . H/O hiatal hernia   . Pre-operative cardiovascular examination   . Nonspecific abnormal unspecified cardiovascular function study   . Peripheral vascular disease, unspecified ([place])   . Pneumonia 2014    ?   Marland Kitchen On home oxygen therapy     prn  . Constipation   . 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 S/P radiation therapy 12/04/14 srs    lt frontal brain  . S/P radiation therapy 07/18/15    SRS left frontal brain 20Gy  .  Seizures ([place]) 12/13/15  . Cancer ([place]) 09/14/12    invasive mod diff squamous cell ca  . Metastasis to brain ([place]) 02/2015  . History of chemotherapy   . Headache   . Occasional tremors     feet and legs    Social History   Social History  . Marital Status: Married    Spouse Name: [name]  . Number of Children: Y  . Years of Education: N/A   Occupational History  . worked in Education administrator    Social History Main Topics  . Smoking status: Former Smoker -- 1.00 packs/day for 55 years    Types: Cigarettes    Quit date: 08/16/2014  . Smokeless tobacco: Former Systems developer    Quit date: 04/19/1952  . Alcohol Use: Yes     Comment: occianional- not since I ve sick.  . Drug Use: No  . Sexual Activity: Not Asked   Other Topics Concern  . None   Social History Narrative   Family History  Problem Relation Age of Onset  . Heart disease Father     MI  . Heart attack Father   . Alcohol abuse Mother   . Stroke Brother   . Heart disease Brother   . Depression Sister   . Heart attack Brother    Scheduled Meds: . amLODipine  5 mg Oral Daily  . aspirin EC  81 mg Oral Daily  . budesonide (PULMICORT) nebulizer solution  0.25 mg Nebulization BID  . cloNIDine  0.1 mg Oral BID  . dexamethasone  4 mg Oral Q6H  . famotidine  20 mg Oral BID  . furosemide  40 mg Oral Daily  . guaiFENesin  400 mg Oral BID  . insulin aspart  2-6 Units Subcutaneous Q4H  . ipratropium-albuterol  3 mL Nebulization QID  . isosorbide mononitrate  30 mg Oral Daily  . levETIRAcetam  1,500 mg Oral BID  . levofloxacin  500 mg Oral Daily  . metoprolol tartrate  50 mg Oral BID  . pramipexole  1 mg Oral QHS  . sodium chloride  2 g Oral TID WC   Continuous Infusions:  PRN Meds:.clonazePAM, HYDROcodone-acetaminophen, labetalol, ondansetron **OR** ondansetron (ZOFRAN) IV, promethazine, sodium chloride Medications Prior to Admission:  Prior to Admission medications   Medication Sig Start Date End Date Taking? Authorizing Provider    amLODipine (NORVASC) 5 MG tablet Take 5 mg by mouth daily.   Yes Historical Provider, MD  aspirin EC 81 MG tablet Take 81 mg by mouth daily. Reported on 12/22/2015   Yes Historical Provider, MD  azithromycin (ZITHROMAX) 250 MG tablet Take 1 tablet (250 mg total) by mouth daily. 12/30/15  Yes Samuella Cota, MD  clonazePAM (KLONOPIN) 0.5 MG tablet Take 0.5 mg by mouth 2 (two) times daily  as needed for anxiety. Takes one tablet by mouth two times daily as needed for anxiety.   Yes Historical Provider, MD  cloNIDine (CATAPRES) 0.1 MG tablet Take 0.1 mg by mouth 2 (two) times daily.   Yes Historical Provider, MD  dexamethasone (DECADRON) 4 MG tablet Take 1 tablet (4 mg total) by mouth 3 (three) times daily. 01/02/16  Yes Noralee Space, MD  furosemide (LASIX) 20 MG tablet Take 2 tablets (40 mg total) by mouth daily. 12/25/15  Yes Noralee Space, MD  furosemide (LASIX) 40 MG tablet Take 1 tablet (40 mg total) by mouth 2 (two) times daily. 01/02/16  Yes Noralee Space, MD  guaifenesin (HUMIBID E) 400 MG TABS tablet Take 400 mg by mouth 2 (two) times daily.   Yes Historical Provider, MD  HYDROcodone-acetaminophen (NORCO) 7.5-325 MG tablet Take 1 tablet by mouth every 6 (six) hours as needed for moderate pain. 09/25/15  Yes Heather F Boelter, NP  ipratropium-albuterol (DUONEB) 0.5-2.5 (3) MG/3ML SOLN Take 3 mLs by nebulization 4 (four) times daily. Patient taking differently: Take 3 mLs by nebulization 4 (four) times daily. Pt takes nebulizer 2 to 3 times daily. 12/18/14  Yes Noralee Space, MD  isosorbide mononitrate (IMDUR) 30 MG 24 hr tablet Take 1 tablet (30 mg total) by mouth daily. 08/12/15  Yes Jettie Booze, MD  levETIRAcetam (KEPPRA) 750 MG tablet Take 2 tablets (1,500 mg total) by mouth 2 (two) times daily. 12/24/15  Yes Penni Bombard, MD  levofloxacin (LEVAQUIN) 500 MG tablet Take 500 mg by mouth daily.   Yes Historical Provider, MD  lisinopril (PRINIVIL,ZESTRIL) 40 MG tablet Take 40 mg by mouth  at bedtime.  04/23/15  Yes Historical Provider, MD  metoprolol tartrate (LOPRESSOR) 25 MG tablet Take 1 tablet (25 mg total) by mouth 2 (two) times daily. For systolic BP of 595 or greater take 50 mg BID Patient taking differently: Take 50 mg by mouth 2 (two) times daily. For systolic BP of 638 or greater take 50 mg BID 12/18/15  Yes Almyra Deforest, PA  omeprazole (PRILOSEC) 40 MG capsule Take 40 mg by mouth daily.   Yes Historical Provider, MD  OXYGEN Inhale 2-3 L into the lungs continuous.   Yes Historical Provider, MD  pramipexole (MIRAPEX) 1 MG tablet Take 1 mg by mouth at bedtime.   Yes Historical Provider, MD  sodium chloride (OCEAN) 0.65 % SOLN nasal spray Place 1 spray into both nostrils as needed for congestion.   Yes Historical Provider, MD  traMADol (ULTRAM) 50 MG tablet Take 1 tablet (50 mg total) by mouth every 12 (twelve) hours as needed. Patient taking differently: Take 50 mg by mouth daily as needed (pain).  01/29/15  Yes Laurie Panda, NP  triamcinolone cream (KENALOG) 0.1 % Apply 1 application topically 2 (two) times daily as needed (itching/ dry skin).  10/30/15  Yes Historical Provider, MD  zolpidem (AMBIEN) 10 MG tablet Take 10 mg by mouth at bedtime as needed for sleep. Reported on 09/25/2015   Yes Historical Provider, MD  levofloxacin (LEVAQUIN) 500 MG tablet Take 1 tablet (500 mg total) by mouth daily. 01/02/16   Noralee Space, MD   No Known Allergies Review of Systems  Unable to perform ROS: Mental status change    Physical Exam  Constitutional: He appears well-developed and well-nourished.  HENT:  Incision to top of head  Neck: Normal range of motion.  Pulmonary/Chest: He has no wheezes.  Increased work of breathing  at rest  Neurological:  Left sided weakness and neglect  Skin: Skin is warm.  bruised  Psychiatric:  Confused; mood irritable  Nursing note and vitals reviewed.   Vital Signs: BP 161/71 mmHg  Pulse 75  Temp(Src) 98.3 F (36.8 C) (Oral)  Resp 19  Ht  '5\' 9"'$  (1.753 m)  Wt 88.678 kg (195 lb 8 oz)  BMI 28.86 kg/m2  SpO2 100% Pain Assessment: 0-10 POSS *See Group Information*: 1-Acceptable,Awake and alert Pain Score: 5    SpO2: SpO2: 100 % O2 Device:SpO2: 100 % O2 Flow Rate: .O2 Flow Rate (L/min): 2 L/min  IO: Intake/output summary:  Intake/Output Summary (Last 24 hours) at 01/09/16 1110 Last data filed at 01/09/16 0508  Gross per 24 hour  Intake    240 ml  Output      0 ml  Net    240 ml    LBM: Last BM Date: 01/07/16 Baseline Weight: Weight: 85.1 kg (187 lb 9.8 oz) Most recent weight: Weight: 88.678 kg (195 lb 8 oz)     Palliative Assessment/Data:   Flowsheet Rows        Most Recent Value   Intake Tab    Referral Department  Hospitalist   Unit at Time of Referral  Other (Comment)   Palliative Care Primary Diagnosis  Neurology   Date Notified  01/07/16   Palliative Care Type  New Palliative care   Reason for referral  Clarify Goals of Care   Date of Admission  01/06/16   Date first seen by Palliative Care  01/09/16   # of days Palliative referral response time  2 Day(s)   # of days IP prior to Palliative referral  1   Clinical Assessment    Palliative Performance Scale Score  40%   Pain Max last 24 hours  Not able to report   Pain Min Last 24 hours  Not able to report   Dyspnea Max Last 24 Hours  Not able to report   Dyspnea Min Last 24 hours  Not able to report   Nausea Max Last 24 Hours  Not able to report   Nausea Min Last 24 Hours  Not able to report   Anxiety Max Last 24 Hours  Not able to report   Anxiety Min Last 24 Hours  Not able to report   Other Max Last 24 Hours  Not able to report   Psychosocial & Spiritual Assessment    Palliative Care Outcomes    Patient/Family meeting held?  Yes   Who was at the meeting?  pt and wife   Palliative Care Outcomes  Clarified goals of care   Palliative Care follow-up planned  Yes, Facility      Time In: 0900 Time Out: 1010 Time Total: 70 min Greater than 50%   of this time was spent counseling and coordinating care related to the above assessment and plan.  Signed by: Dory Horn, NP   Please contact Palliative Medicine Team phone at [phone] for questions and concerns.  For individual provider: See Shea Evans

## 2016-01-09 NOTE — Evaluation (Signed)
Physical Therapy Evaluation Patient Details Name: [name] MRN: [id number] DOB: 05/10/1941 Today's Date: 01/09/2016   History of Present Illness  Patient is a 75 y/o male with recurrent lung ca with brain mets, COPD, restless leg syndrome, anxiety presents s/p craniotomy with resection of a right occipital mass.s/p fall in hospital 6/30.  Clinical Impression  Patient presents with decreased functional strength, left inattention, visual deficits, impaired balance, new onset of confusion and decreased endurance s/p above impacting mobility. Requires Mod A for gait training due to imbalance, visual deficits and difficulty following directional cues. Pt with 3/4 DOE due to hx of end stage COPD and lung ca. Lengthy discussion with pt's wife about current situation and safety at home and pt reports she is not able to care for her at home at this time. Pt is a high fall risk. Will follow acutely to maximize independence and mobility prior to return home.    Follow Up Recommendations CIR    Equipment Recommendations  Wheelchair (measurements PT);Wheelchair cushion (measurements PT)    Recommendations for Other Services Rehab consult;OT consult     Precautions / Restrictions Precautions Precautions: Fall Precaution Comments: left visual field deficits; left inattention Restrictions Weight Bearing Restrictions: No      Mobility  Bed Mobility Overal bed mobility: Needs Assistance Bed Mobility: Sit to Supine       Sit to supine: Min assist   General bed mobility comments: Assist to bring LLE into bed to return to supine. No use of rail, HOB flat.   Transfers Overall transfer level: Needs assistance Equipment used: None Transfers: Sit to/from Stand Sit to Stand: Min assist         General transfer comment: Min A to boost from chair x2 with cues for technique. posterior lean upon standing reaching for support. Difficulty following commands/directional cues to get to chair/bed  due to visual deficits.  Ambulation/Gait Ambulation/Gait assistance: Mod assist Ambulation Distance (Feet): 100 Feet Assistive device: 1 person hand held assist (hand held assist and pushing 02 tank) Gait Pattern/deviations: Shuffle;Wide base of support;Step-to pattern;Decreased stride length;Leaning posteriorly Gait velocity: dec Gait velocity interpretation: Below normal speed for age/gender General Gait Details: Desired to push 02 tank and hand held assist on other UE; ambulated on 3L/min 02, Sp02 90%. 3/4 DOE. 2 standing rest breaks. Running into wall on left side x3. difficulty following directional commands.   Stairs            Wheelchair Mobility    Modified Rankin (Stroke Patients Only) Modified Rankin (Stroke Patients Only) Pre-Morbid Rankin Score: No significant disability Modified Rankin: Moderately severe disability     Balance Overall balance assessment: Needs assistance Sitting-balance support: Feet supported;No upper extremity supported Sitting balance-Leahy Scale: Fair     Standing balance support: During functional activity Standing balance-Leahy Scale: Poor Standing balance comment: Requires UE support in standing due to unsteadiness and posterior lean.                             Pertinent Vitals/Pain Pain Assessment: No/denies pain    Home Living Family/patient expects to be discharged to:: Private residence Living Arrangements: Spouse/significant other Available Help at Discharge: Family;Available 24 hours/day Type of Home: House Home Access: Stairs to enter Entrance Stairs-Rails: None Entrance Stairs-Number of Steps: 3 Home Layout: One level Home Equipment: Walker - 2 wheels;Cane - single point;Hospital bed      Prior Function Level of Independence: Independent  Comments: Drives, cares for self. Wife has cameras around house to be able to see pt esp when he is on the porch. Wears 3L/min 02 at baseline at home.  Household ambulator.     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     Communication   Communication: No difficulties  Cognition Arousal/Alertness: Awake/alert Behavior During Therapy: WFL for tasks assessed/performed Overall Cognitive Status: Impaired/Different from baseline Area of Impairment: Orientation;Awareness;Problem solving;Following commands;Safety/judgement;Memory Orientation Level: Time   Memory: Decreased recall of precautions Following Commands:  (multil modal cues and time) Safety/Judgement: Decreased awareness of safety;Decreased awareness of deficits   Problem Solving: Slow processing;Difficulty sequencing;Requires verbal cues General Comments: Difficulty following directional cues, goesn right when asked to turn left. Left inattention esp with LUE; able to gaze left and read signs on left but has to compensate by moving head. New confusion per wife. Not able to use call bell correctly.    General Comments General comments (skin integrity, edema, etc.): Wife present during session. Reports new onset of confusion- difficulty with problem solving on how to use call bell, phone., easily irritated/snappy with her.    Exercises        Assessment/Plan    PT Assessment Patient needs continued PT services  PT Diagnosis Difficulty walking;Generalized weakness;Abnormality of gait;Altered mental status   PT Problem List Decreased strength;Cardiopulmonary status limiting activity;Decreased coordination;Decreased cognition;Impaired sensation;Decreased activity tolerance;Decreased balance;Decreased mobility;Decreased safety awareness  PT  Treatment Interventions Balance training;Gait training;Neuromuscular re-education;Stair training;Functional mobility training;Therapeutic activities;Therapeutic exercise;Wheelchair mobility training;Patient/family education;DME instruction   PT Goals (Current goals can be found in the Care Plan section) Acute Rehab PT Goals Patient Stated Goal: to go home PT Goal Formulation: With patient Time For Goal Achievement: 01/23/16 Potential to Achieve Goals: Fair    Frequency Min 4X/week   Barriers to discharge Decreased caregiver support;Inaccessible home environment Wife has a prosthesis and not able to assist physically at home. Stairs to get into home    Co-evaluation               End of Session Equipment Utilized During Treatment: Gait belt Activity Tolerance: Patient limited by fatigue Patient left: in chair;with call bell/phone within reach;with chair alarm set;with nursing/sitter in room Nurse Communication: Mobility status         Time: 4076-8088 PT Time Calculation (min) (ACUTE ONLY): 39 min   Charges:   PT Evaluation $PT Eval High Complexity: 1 Procedure PT Treatments $Gait Training: 8-22 mins $Therapeutic Activity: 8-22 mins   PT G Codes:        Daltin Crist A Byron Peacock 01/09/2016, 10:45 AM Wray Kearns, PT, DPT [phone]

## 2016-01-09 NOTE — Progress Notes (Signed)
OT also recommends inpt rehab consult. Please place order if in agreement.[phone]

## 2016-01-10 ENCOUNTER — Inpatient Hospital Stay (HOSPITAL_COMMUNITY): Payer: Medicare Other

## 2016-01-10 DIAGNOSIS — R600 Localized edema: Secondary | ICD-10-CM

## 2016-01-10 LAB — GLUCOSE, CAPILLARY
GLUCOSE-CAPILLARY: 136 mg/dL — AB (ref 65–99)
GLUCOSE-CAPILLARY: 127 mg/dL — AB (ref 65–99)
GLUCOSE-CAPILLARY: 170 mg/dL — AB (ref 65–99)
Glucose-Capillary: 111 mg/dL — ABNORMAL HIGH (ref 65–99)
Glucose-Capillary: 124 mg/dL — ABNORMAL HIGH (ref 65–99)
Glucose-Capillary: 125 mg/dL — ABNORMAL HIGH (ref 65–99)

## 2016-01-10 LAB — BASIC METABOLIC PANEL
Anion gap: 10 (ref 5–15)
BUN: 20 mg/dL (ref 6–20)
CALCIUM: 8.1 mg/dL — AB (ref 8.9–10.3)
CO2: 30 mmol/L (ref 22–32)
CREATININE: 0.81 mg/dL (ref 0.61–1.24)
Chloride: 91 mmol/L — ABNORMAL LOW (ref 101–111)
GFR calc Af Amer: >60 mL/min (ref >60)
GLUCOSE: 126 mg/dL — AB (ref 65–99)
Potassium: 5.1 mmol/L (ref 3.5–5.1)
SODIUM: 131 mmol/L — AB (ref 135–145)

## 2016-01-10 MED ORDER — FAMOTIDINE 20 MG PO TABS
40.0000 mg | ORAL_TABLET | Freq: Two times a day (BID) | ORAL | Status: DC
  Administered 2016-01-10 – 2016-01-16 (×12): 40 mg via ORAL
  Filled 2016-01-10 (×12): qty 2

## 2016-01-10 MED ORDER — FUROSEMIDE 10 MG/ML IJ SOLN
20.0000 mg | Freq: Once | INTRAMUSCULAR | Status: AC
  Administered 2016-01-10: 20 mg via INTRAVENOUS
  Filled 2016-01-10: qty 4

## 2016-01-10 MED ORDER — SODIUM CHLORIDE 0.9% FLUSH
10.0000 mL | INTRAVENOUS | Status: DC | PRN
  Administered 2016-01-14: 10 mL
  Filled 2016-01-10: qty 40

## 2016-01-10 MED ORDER — FUROSEMIDE 10 MG/ML IJ SOLN
40.0000 mg | Freq: Two times a day (BID) | INTRAMUSCULAR | Status: DC
  Administered 2016-01-10 – 2016-01-16 (×12): 40 mg via INTRAVENOUS
  Filled 2016-01-10 (×12): qty 4

## 2016-01-10 MED ORDER — DEXAMETHASONE 4 MG PO TABS
6.0000 mg | ORAL_TABLET | Freq: Four times a day (QID) | ORAL | Status: DC
  Administered 2016-01-10 – 2016-01-11 (×4): 6 mg via ORAL
  Filled 2016-01-10 (×4): qty 1

## 2016-01-10 NOTE — Progress Notes (Signed)
Subjective: Patient reports Not feeling as well this morning headaches not worse but stiffness and clumsiness with both upper extremities   Objective: Vital signs in last 24 hours: Temp:  [97.8 F (36.6 C)-99.5 F (37.5 C)] 97.8 F (36.6 C) (07/01 0545) Pulse Rate:  [61-97] 61 (07/01 0545) Resp:  [18-20] 18 (07/01 0545) BP: (116-161)/(68-87) 121/68 mmHg (07/01 0545) SpO2:  [99 %-100 %] 99 % (07/01 0545)  Intake/Output from previous day: 06/30 0701 - 07/01 0700 In: -  Out: 425 [Urine:425] Intake/Output this shift:    Patient remains awake and alert apraxia bilaterally left greater than right left-sided visual field deficit  Lab Results:  Recent Labs  01/09/16 0840  WBC 6.8  HGB 10.4*  HCT 32.2*  PLT 128*   BMET  Recent Labs  01/09/16 0335 01/09/16 0840  NA 128* 130*  K 4.9 4.7  CL 92* 94*  CO2 32 31  GLUCOSE 108* 97  BUN 23* 21*  CREATININE 0.86 0.86  CALCIUM 7.2* 7.4*    Studies/Results: Ct Head Wo Contrast  01/09/2016  CLINICAL DATA:  Sudden onset of acute confusion and severe headache. Recent craniotomy for metastatic lung cancer. EXAM: CT HEAD WITHOUT CONTRAST TECHNIQUE: Contiguous axial images were obtained from the base of the skull through the vertex without intravenous contrast. COMPARISON:  Brain MRI 12/25/2015 FINDINGS: Sequelae of interval right parietal craniotomy are identified. There is a 4.5 x 2.8 x 4.0 cm hematoma in the right parietal lobe deep to the resection site. There is mild surrounding vasogenic edema with regional sulcal effacement but no midline shift. There is a small extra-axial collection/hematoma subjacent to the craniotomy which measures up to 7 mm in thickness. Elsewhere, there is no evidence of acute infarct or mass. The small metastases in the left frontal lobe and right cerebellum on MRI are not clearly seen on this noncontrast CT. There is mild global cerebral atrophy. Periventricular white matter hypodensities are nonspecific but  compatible with mild chronic small vessel ischemic disease. Prior right cataract extraction is noted. Skin staples and swelling are noted in the right parietal scalp. Small amount of secretions are present in the right sphenoid sinus. There is a trace left mastoid effusion. Calcified atherosclerosis is noted at the skullbase. IMPRESSION: Interval postoperative changes with new 4.5 cm right parietal hematoma. Mild surrounding edema without midline shift. Critical Value/emergent results were called by telephone at the time of interpretation on 01/09/2016 at 10:22 am to Dr. Kary Kos , who verbally acknowledged these results. Electronically Signed   By: Logan Bores M.D.   On: 01/09/2016 10:27    Assessment/Plan: Patient continues to display evidence of increased left parietal-occipital lobe edema with increased apraxia. We will repeat a CT to ensure stability of what looks to be a venous infarction happened deep to the surgical resection cavity. We'll also increase his Decadron dose and add a dose of IV Lasix to pull some fluid off.  LOS: 4 days     Dauna Ziska P 01/10/2016, 9:28 AM

## 2016-01-10 NOTE — Progress Notes (Signed)
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 Post surgery, admitted to ICU and CCM provided medical consultation. Transferred to floor on 6/29 &TRH assumed consultation services. Worsening mental status 7/1 and CT showing worsening venous infarction with mass effect.   Assessment & Plan:   Active Problems:   Metastatic lung cancer (metastasis from lung to other site) [facility])   S/P craniotomy   Acute on chronic respiratory failure ([place])   Palliative care encounter   Acute confusion   S/P R Parietal Mass Resection - milky white drainage during case from mass - all tumor -Management per neurosurgery/primary service. - Currently on dexamethasone and on Keppra for seizures. Remains on empiric levofloxacin-discussed with Dr. Saintclair Halsted, no clear indication and hence will DC. - Intra-Op cultures negative to date. - Intra-Op pathology of brain tumor: Metastatic squamous cell carcinoma consistent with lung primary - Repeat head CT 6/30: New 4.5 cm right parietal hematoma. Mild surrounding edema without midline shift. - CT heads 7/1: Gas and blood containing epidural collection below the craniotomy site with maximal thickness of  5.6 mm without significant change. Enlarging right parietal lobe hematoma with surrounding vasogenic edema and mass effect upon right lateral ventricle. 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  RLS - Continue Mirapex.  Oxygen dependent and GOLD 3-4 COPD/chronic hypoxic respiratory failure - On home oxygen between 2-3 L/m continuously. Former tobacco abuse history. - Continue pulmonary toilet. - Seems stable.  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 and Dr. Lisbeth Renshaw - Palliative care consulted by CCM prior to moving out of ICU. As per palliative care consultation, continued aggressive care and full code.  Essential hypertension - controlled. Currently on amlodipine, clonidine, Imdur, metoprolol. Lisinopril discontinued this admission.  PVD/CAD - Remains on aspirin-decision regarding stopping due to brain hematoma deferred to neurosurgery.  Hyponatremia - ? SIADH. Periodically follow BMP. Agree with salt tablets.  Anemia of chronic disease - Stable.  Thrombocytopenia - Stable  Confusion - May be multifactorial related to brain surgery, brain metastases, new worsening brain hematoma, acute illness, hospitalization, pain medications. Hyponatremia seems stable and not very low as cause for his confusion. Treat the treatable. Minimize pain/sedative medications. Sleep hygiene.  Hyperglycemia - Secondary to steroids. Well-controlled. Currently on SSI.  Lower extremity edema - May be multifactorial secondary to hypoalbuminemia, fluid resuscitation early on during hospitalization and? Chronic diastolic CHF. As per family, PCP was adjusting diuretic dose as outpatient. - 2-D echo 12/30/15: LVEF 55-60 percent. Moderate focal basal hypertrophy of the septum and mild hypertrophy of the posterior wall. - Trial of IV Lasix.   DVT  prophylaxis: SCD's Code Status: Full Family Communication: Discussed with  spouse, daughter and extended family at bedside Disposition Plan: to be determined.   Consultants:   CCM- signed off  Palliative Team  TRH are consultants  Procedures:   Stereotactic Radiosurgery for solitary brain metastasis on 01/05/16  Extubated  Antimicrobials:   Cefazolin 1 dose 6/27.  Levofloxacin 6/27 >. 7/1    Subjective: Worsening headache, confusion and left-sided apraxia. Poor appetite. Leg edema.  Objective:  Filed Vitals:   01/09/16 2144 01/10/16 0126 01/10/16 0545 01/10/16 0936  BP: 132/87 116/75 121/68 139/80  Pulse: 97 70 61 72  Temp: 99.5 F (37.5 C) 98 F (36.7 C) 97.8 F (36.6 C) 98.1 F (36.7 C)  TempSrc: Oral Oral Oral Oral  Resp: '20 18 18 18  '$ Height:      Weight:      SpO2: 99% 100% 99% 100%    Intake/Output Summary (Last 24 hours) at 01/10/16 1404 Last data filed at 01/10/16 1209  Gross per 24 hour  Intake      0 ml  Output   1825 ml  Net  -1825 ml   Filed Weights   01/07/16 0600 01/07/16 2348  Weight: 85.1 kg (187 lb 9.8 oz) 88.678 kg (195 lb 8 oz)    Examination:  General exam: Pleasant elderly male, frail and chronically ill-looking lying comfortably supine in bed. Respiratory system: Clear to auscultation. Respiratory effort normal. Cardiovascular system: S1 & S2 heard, RRR. No JVD, murmurs, rubs, gallops or clicks. 1+ pitting bilateral pedal edema. Not on telemetry. Gastrointestinal system: Abdomen is nondistended, soft and nontender. No organomegaly or masses felt. Normal bowel sounds heard. Central nervous system: Alert and oriented to person and partly to place. Left-sided visual deficit. Extremities: Symmetric 5 x 5 power. Left-sided apraxia. Skin: No rashes, lesions or ulcers Psychiatry: Judgement and insight appear poor. Mood & affect are flat.     Data Reviewed: I have personally reviewed following labs and imaging  studies  CBC:  Recent Labs Lab 01/06/16 0725 01/06/16 1038 01/06/16 1217 01/09/16 0840  WBC  --   --  5.8 6.8  HGB 11.6* 8.8* 10.1* 10.4*  HCT 34.0* 26.0* 31.5* 32.2*  MCV  --   --  92.9 94.2  PLT  --   --  122* 299*   Basic Metabolic Panel:  Recent Labs Lab 01/06/16 1038 01/06/16 1217 01/07/16 0400 01/09/16 0335 01/09/16 0840 01/10/16 0854  NA 129* 131*  --  128* 130* 131*  K 4.4 4.5  --  4.9 4.7 5.1  CL  --  93*  --  92* 94* 91*  CO2  --  32  --  32 31 30  GLUCOSE  --  119*  --  108* 97 126*  BUN  --  19  --  23* 21* 20  CREATININE  --  0.83  --  0.86 0.86 0.81  CALCIUM  --  7.4*  --  7.2* 7.4* 8.1*  MG  --   --  2.0  --   --   --   PHOS  --   --  3.4  --   --   --    GFR: Estimated Creatinine Clearance: 90.8 mL/min (by C-G formula based on Cr of 0.81). Liver Function Tests: No results for input(s): AST, ALT, ALKPHOS, BILITOT, PROT, ALBUMIN in the last 168 hours. No results for input(s): LIPASE, AMYLASE in the last 168 hours. No results for input(s): AMMONIA in the last 168 hours. Coagulation Profile:  Recent Labs Lab 01/07/16 1052  INR 0.95   Cardiac Enzymes: No results for input(s): CKTOTAL, CKMB, CKMBINDEX, TROPONINI in the last 168 hours. BNP (last 3 results) No results for input(s): PROBNP in the last 8760 hours. HbA1C: No results for input(s): HGBA1C in the last 72 hours. CBG:  Recent Labs Lab 01/09/16 1942 01/10/16 0015 01/10/16 0416 01/10/16 0633 01/10/16 1105  GLUCAP 87 125* 136* 127* 124*   Lipid Profile: No results for input(s): CHOL, HDL, LDLCALC, TRIG, CHOLHDL, LDLDIRECT in the last 72 hours. Thyroid Function Tests: No results for input(s): TSH, T4TOTAL, FREET4, T3FREE, THYROIDAB in the last 72 hours. Anemia Panel: No results for input(s): VITAMINB12, FOLATE, FERRITIN, TIBC, IRON, RETICCTPCT in the last 72 hours.  Sepsis Labs: No results for input(s): PROCALCITON, LATICACIDVEN in the last 168 hours.  Recent Results (from the  past 240 hour(s))  Aerobic/Anaerobic Culture (surgical/deep wound)     Status: None (Preliminary result)   Collection Time: 01/06/16 10:10 AM  Result Value Ref Range Status   Specimen Description WOUND  Final   Special Requests RIGHT PARIETAL OCCIPITAL MASS  Final   Gram Stain   Final    MODERATE WBC PRESENT, PREDOMINANTLY MONONUCLEAR NO ORGANISMS SEEN Gram Stain Report Called to,Read Back By and Verified With: G CRAM,MD AT 1116 BY L BENFIELD    Culture   Final    NO GROWTH 2 DAYS NO ANAEROBES ISOLATED; CULTURE IN PROGRESS FOR 5 DAYS   Report Status PENDING  Incomplete         Radiology Studies: Ct Head Wo Contrast  01/10/2016  CLINICAL DATA:  75 year old male with metastatic lung cancer post craniotomy. Subsequent encounter. EXAM: CT HEAD WITHOUT CONTRAST TECHNIQUE: Contiguous axial images were obtained from the base of the skull through the vertex without intravenous contrast. COMPARISON:  01/09/2016 head CT.  12/13/2015 brain MR. FINDINGS: Post right parietal craniotomy. Gas and blood containing epidural collection below the craniotomy site with maximal thickness of 5.6 mm without significant change. Large right parietal lobe hematoma appears minimally more prominent than on yesterday's examination now measuring 3.7 x 4.3 x 4.2 cm versus prior 3.2 x 3.7 x 4 cm. Surrounding vasogenic edema and mass effect upon the right lateral ventricle which is displaced inferiorly. MR detected intracranial metastatic lesions are not as well delineated on the present unenhanced CT. IMPRESSION: Post right parietal craniotomy. Gas and blood containing epidural collection below the craniotomy site with maximal thickness of 5.6 mm without significant change. Large right parietal lobe hematoma appears minimally more prominent than on yesterday's examination now measuring 3.7 x 4.3 x 4.2 cm versus prior 3.2 x 3.7 x 4 cm. Surrounding vasogenic edema and mass effect upon the right lateral ventricle which is  displaced inferiorly. Electronically Signed   By: Genia Del M.D.   On: 01/10/2016 11:06   Ct Head Wo Contrast  01/09/2016  CLINICAL DATA:  Sudden onset of acute confusion and severe headache. Recent craniotomy for metastatic lung cancer. EXAM: CT HEAD WITHOUT CONTRAST TECHNIQUE: Contiguous axial images were obtained from the base of the skull through the vertex without intravenous contrast. COMPARISON:  Brain MRI 12/25/2015 FINDINGS: Sequelae of interval right parietal craniotomy are identified. There is a 4.5 x 2.8 x 4.0 cm hematoma in the right parietal lobe deep to the resection site. There is mild surrounding vasogenic edema with regional sulcal effacement but no midline shift. There is a small extra-axial collection/hematoma subjacent to the craniotomy which measures up to 7 mm in thickness. Elsewhere, there is no evidence of acute  infarct or mass. The small metastases in the left frontal lobe and right cerebellum on MRI are not clearly seen on this noncontrast CT. There is mild global cerebral atrophy. Periventricular white matter hypodensities are nonspecific but compatible with mild chronic small vessel ischemic disease. Prior right cataract extraction is noted. Skin staples and swelling are noted in the right parietal scalp. Small amount of secretions are present in the right sphenoid sinus. There is a trace left mastoid effusion. Calcified atherosclerosis is noted at the skullbase. IMPRESSION: Interval postoperative changes with new 4.5 cm right parietal hematoma. Mild surrounding edema without midline shift. Critical Value/emergent results were called by telephone at the time of interpretation on 01/09/2016 at 10:22 am to Dr. Kary Kos , who verbally acknowledged these results. Electronically Signed   By: Logan Bores M.D.   On: 01/09/2016 10:27        Scheduled Meds: . amLODipine  5 mg Oral Daily  . aspirin EC  81 mg Oral Daily  . budesonide (PULMICORT) nebulizer solution  0.25 mg  Nebulization BID  . cloNIDine  0.1 mg Oral BID  . dexamethasone  6 mg Oral Q6H  . famotidine  40 mg Oral BID  . furosemide  40 mg Oral Daily  . guaiFENesin  400 mg Oral BID  . insulin aspart  0-9 Units Subcutaneous TID WC  . ipratropium-albuterol  3 mL Nebulization QID  . isosorbide mononitrate  30 mg Oral Daily  . levETIRAcetam  1,500 mg Oral BID  . levofloxacin  500 mg Oral Daily  . metoprolol tartrate  50 mg Oral BID  . pramipexole  1 mg Oral QHS  . sodium chloride  2 g Oral TID WC   Continuous Infusions:    LOS: 4 days    Time spent: 30 minutes.    [facility], MD [facility] Pager [phone]  If 7PM-7AM, please contact night-coverage [email or web address] Password [email or web address] 01/10/2016, 2:04 PM

## 2016-01-11 DIAGNOSIS — R112 Nausea with vomiting, unspecified: Secondary | ICD-10-CM

## 2016-01-11 LAB — GLUCOSE, CAPILLARY
GLUCOSE-CAPILLARY: 186 mg/dL — AB (ref 65–99)
Glucose-Capillary: 114 mg/dL — ABNORMAL HIGH (ref 65–99)
Glucose-Capillary: 124 mg/dL — ABNORMAL HIGH (ref 65–99)
Glucose-Capillary: 215 mg/dL — ABNORMAL HIGH (ref 65–99)

## 2016-01-11 LAB — HEPATIC FUNCTION PANEL
ALT: 15 U/L — AB (ref 17–63)
AST: 23 U/L (ref 15–41)
Albumin: 2.4 g/dL — ABNORMAL LOW (ref 3.5–5.0)
Alkaline Phosphatase: 35 U/L — ABNORMAL LOW (ref 38–126)
BILIRUBIN TOTAL: 0.3 mg/dL (ref 0.3–1.2)
Total Protein: 4.4 g/dL — ABNORMAL LOW (ref 6.5–8.1)

## 2016-01-11 LAB — AEROBIC/ANAEROBIC CULTURE W GRAM STAIN (SURGICAL/DEEP WOUND): Culture: NO GROWTH

## 2016-01-11 LAB — BASIC METABOLIC PANEL
Anion gap: 4 — ABNORMAL LOW (ref 5–15)
BUN: 21 mg/dL — AB (ref 6–20)
CALCIUM: 7.9 mg/dL — AB (ref 8.9–10.3)
CO2: 36 mmol/L — AB (ref 22–32)
Chloride: 92 mmol/L — ABNORMAL LOW (ref 101–111)
Creatinine, Ser: 0.82 mg/dL (ref 0.61–1.24)
GFR calc Af Amer: >60 mL/min (ref >60)
GLUCOSE: 136 mg/dL — AB (ref 65–99)
Potassium: 4.3 mmol/L (ref 3.5–5.1)
Sodium: 132 mmol/L — ABNORMAL LOW (ref 135–145)

## 2016-01-11 LAB — AEROBIC/ANAEROBIC CULTURE (SURGICAL/DEEP WOUND)

## 2016-01-11 MED ORDER — IPRATROPIUM-ALBUTEROL 0.5-2.5 (3) MG/3ML IN SOLN
3.0000 mL | Freq: Four times a day (QID) | RESPIRATORY_TRACT | Status: DC
  Administered 2016-01-11 – 2016-01-14 (×12): 3 mL via RESPIRATORY_TRACT
  Filled 2016-01-11 (×12): qty 3

## 2016-01-11 MED ORDER — DEXAMETHASONE 2 MG PO TABS
2.0000 mg | ORAL_TABLET | Freq: Four times a day (QID) | ORAL | Status: DC
Start: 2016-01-11 — End: 2016-01-15
  Administered 2016-01-11 – 2016-01-15 (×16): 2 mg via ORAL
  Filled 2016-01-11 (×15): qty 1

## 2016-01-11 MED ORDER — PANTOPRAZOLE SODIUM 40 MG PO TBEC
40.0000 mg | DELAYED_RELEASE_TABLET | Freq: Two times a day (BID) | ORAL | Status: DC
  Administered 2016-01-11 – 2016-01-16 (×11): 40 mg via ORAL
  Filled 2016-01-11 (×11): qty 1

## 2016-01-11 MED ORDER — IPRATROPIUM-ALBUTEROL 0.5-2.5 (3) MG/3ML IN SOLN: 3.0000 mL | Freq: Two times a day (BID) | RESPIRATORY_TRACT | Status: DC

## 2016-01-11 NOTE — Progress Notes (Signed)
Subjective: Patient reports He feels okay headache much better this morning  Objective: Vital signs in last 24 hours: Temp:  [97.5 F (36.4 C)-98.6 F (37 C)] 97.5 F (36.4 C) (07/02 0500) Pulse Rate:  [68-87] 68 (07/02 0500) Resp:  [16-18] 18 (07/02 0500) BP: (111-139)/(70-82) 122/82 mmHg (07/02 0500) SpO2:  [94 %-100 %] 95 % (07/02 0827) Weight:  [89.676 kg (197 lb 11.2 oz)] 89.676 kg (197 lb 11.2 oz) (07/02 0500)  Intake/Output from previous day: 07/01 0701 - 07/02 0700 In: -  Out: 3800 [Urine:3800] Intake/Output this shift:    Remains confused and agitated nonfocal neurologically but more encephalopathic.  Lab Results:  Recent Labs  01/09/16 0840  WBC 6.8  HGB 10.4*  HCT 32.2*  PLT 128*   BMET  Recent Labs  01/09/16 0840 01/10/16 0854  NA 130* 131*  K 4.7 5.1  CL 94* 91*  CO2 31 30  GLUCOSE 97 126*  BUN 21* 20  CREATININE 0.86 0.81  CALCIUM 7.4* 8.1*    Studies/Results: Ct Head Wo Contrast  01/10/2016  CLINICAL DATA:  75 year old male with metastatic lung cancer post craniotomy. Subsequent encounter. EXAM: CT HEAD WITHOUT CONTRAST TECHNIQUE: Contiguous axial images were obtained from the base of the skull through the vertex without intravenous contrast. COMPARISON:  01/09/2016 head CT.  12/13/2015 brain MR. FINDINGS: Post right parietal craniotomy. Gas and blood containing epidural collection below the craniotomy site with maximal thickness of 5.6 mm without significant change. Large right parietal lobe hematoma appears minimally more prominent than on yesterday's examination now measuring 3.7 x 4.3 x 4.2 cm versus prior 3.2 x 3.7 x 4 cm. Surrounding vasogenic edema and mass effect upon the right lateral ventricle which is displaced inferiorly. MR detected intracranial metastatic lesions are not as well delineated on the present unenhanced CT. IMPRESSION: Post right parietal craniotomy. Gas and blood containing epidural collection below the craniotomy site with  maximal thickness of 5.6 mm without significant change. Large right parietal lobe hematoma appears minimally more prominent than on yesterday's examination now measuring 3.7 x 4.3 x 4.2 cm versus prior 3.2 x 3.7 x 4 cm. Surrounding vasogenic edema and mass effect upon the right lateral ventricle which is displaced inferiorly. Electronically Signed   By: Genia Del M.D.   On: 01/10/2016 11:06   Ct Head Wo Contrast  01/09/2016  CLINICAL DATA:  Sudden onset of acute confusion and severe headache. Recent craniotomy for metastatic lung cancer. EXAM: CT HEAD WITHOUT CONTRAST TECHNIQUE: Contiguous axial images were obtained from the base of the skull through the vertex without intravenous contrast. COMPARISON:  Brain MRI 12/25/2015 FINDINGS: Sequelae of interval right parietal craniotomy are identified. There is a 4.5 x 2.8 x 4.0 cm hematoma in the right parietal lobe deep to the resection site. There is mild surrounding vasogenic edema with regional sulcal effacement but no midline shift. There is a small extra-axial collection/hematoma subjacent to the craniotomy which measures up to 7 mm in thickness. Elsewhere, there is no evidence of acute infarct or mass. The small metastases in the left frontal lobe and right cerebellum on MRI are not clearly seen on this noncontrast CT. There is mild global cerebral atrophy. Periventricular white matter hypodensities are nonspecific but compatible with mild chronic small vessel ischemic disease. Prior right cataract extraction is noted. Skin staples and swelling are noted in the right parietal scalp. Small amount of secretions are present in the right sphenoid sinus. There is a trace left mastoid effusion. Calcified atherosclerosis is  noted at the skullbase. IMPRESSION: Interval postoperative changes with new 4.5 cm right parietal hematoma. Mild surrounding edema without midline shift. Critical Value/emergent results were called by telephone at the time of interpretation on  01/09/2016 at 10:22 am to Dr. Kary Kos , who verbally acknowledged these results. Electronically Signed   By: Logan Bores M.D.   On: 01/09/2016 10:27    Assessment/Plan: Will start weaning his steroids now feel the steroids might be contributing to his aggression and tingling to observe continue physical and  occupational therapy. CT scan essentially unchanged minimal difference in hemorrhage local mass effect appears to be stable  LOS: 5 days     Press Casale P 01/11/2016, 9:10 AM

## 2016-01-11 NOTE — Progress Notes (Signed)
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 Post surgery, admitted to ICU and CCM provided medical consultation. Transferred to floor on 6/29 &TRH assumed consultation services. Worsening mental status 7/1 and CT showing worsening venous infarction with mass effect.   Assessment & Plan:   Active Problems:   Metastatic lung cancer (metastasis from lung to other site) [facility])   S/P craniotomy   Acute on chronic respiratory failure ([facility])   Palliative care encounter   Acute confusion   S/P R Parietal Mass Resection - milky white drainage during case from mass - all tumor -Management per neurosurgery/primary service. - Currently on dexamethasone and on Keppra for seizures. Remains on empiric levofloxacin-discussed with Dr. Saintclair Halsted, no clear indication and hence will DC. - Intra-Op cultures negative to date. - Intra-Op pathology of brain tumor: Metastatic squamous cell carcinoma consistent with lung primary - Repeat head CT 6/30: New 4.5 cm right parietal hematoma. Mild surrounding edema without midline shift. - CT heads 7/1: Gas and blood containing epidural collection below the craniotomy site with maximal thickness of  5.6 mm without significant change. Enlarging right parietal lobe hematoma with surrounding vasogenic edema and mass effect upon right lateral ventricle. - Neurosurgery increased Decadron and added IV Lasix - Headache improved. Worsened confusion overnight 7/1, some of it felt to be d/t steroids> decreasing dose.  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 No reported seizures.  RLS - Continue Mirapex.  Oxygen dependent and GOLD 3-4 COPD/chronic hypoxic respiratory failure - On home oxygen between 2-3 L/m continuously. Former tobacco abuse history. - Continue pulmonary toilet. - Seems stable.  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 and Dr. Lisbeth Renshaw - Palliative care consulted by CCM prior to moving out of ICU. As per palliative care consultation, continued aggressive care and full code.  Essential hypertension - controlled. Currently on amlodipine, clonidine, Imdur, metoprolol. Lisinopril discontinued this admission.  PVD/CAD - Remains on aspirin-decision regarding stopping due to brain hematoma deferred to neurosurgery.  Hyponatremia - ? SIADH. Periodically follow BMP. Agree with salt tablets.  Anemia of chronic disease - Stable.  Thrombocytopenia - Stable  Confusion - May be multifactorial related to brain surgery, brain metastases, new worsening brain hematoma, acute illness, hospitalization, pain medications. Hyponatremia seems stable and not very low as cause for his confusion. Treat the treatable. Minimize pain/sedative medications. Sleep hygiene. - Worsened confusion overnight 7/1, some of it felt to be d/t steroids> decreasing steroid dose. As per family, confusion may be better this morning.  Hyperglycemia - Secondary to steroids. Well-controlled. Currently on SSI.  Lower extremity edema - May be multifactorial secondary to hypoalbuminemia, fluid resuscitation early on  during hospitalization and? Chronic diastolic CHF. As per family, PCP  was adjusting diuretic dose as outpatient. - 2-D echo 12/30/15: LVEF 55-60 percent. Moderate focal basal hypertrophy of the septum and mild hypertrophy of the posterior wall. - Trial of IV Lasix. -2.563 L since admission (-3.8 L last 24 hours). Continue diuresis  Nausea and vomiting - ? Related to problem #1. Treat supportively.   DVT prophylaxis: SCD's Code Status: Full Family Communication: Discussed with spouse, daughter at bedside Disposition Plan: to be determined.   Consultants:   CCM- signed off  Palliative Team  TRH are consultants  Procedures:   Stereotactic Radiosurgery for solitary brain metastasis on 01/05/16  Extubated  Antimicrobials:   Cefazolin 1 dose 6/27.  Levofloxacin 6/27 >. 7/1    Subjective: Denies headache. Overnight with increased confusion for spouse. Per daughter, mental status and confusion better since yesterday morning. Worsened weakness left side. Nausea and nonbloody emesis since morning.  Objective:  Filed Vitals:   01/11/16 0050 01/11/16 0500 01/11/16 0827 01/11/16 1029  BP: 132/79 122/82  113/77  Pulse: 80 68  88  Temp: 97.8 F (36.6 C) 97.5 F (36.4 C)  98.2 F (36.8 C)  TempSrc: Oral Axillary  Oral  Resp: 18 18  118  Height:      Weight:  89.676 kg (197 lb 11.2 oz)    SpO2: 94% 100% 95% 100%    Intake/Output Summary (Last 24 hours) at 01/11/16 1147 Last data filed at 01/10/16 2100  Gross per 24 hour  Intake      0 ml  Output   3800 ml  Net  -3800 ml   Filed Weights   01/07/16 0600 01/07/16 2348 01/11/16 0500  Weight: 85.1 kg (187 lb 9.8 oz) 88.678 kg (195 lb 8 oz) 89.676 kg (197 lb 11.2 oz)    Examination:  General exam: Pleasant elderly male, frail and chronically ill-looking lying comfortably lying propped up in bed. Appears slightly better compared to yesterday. Respiratory system: Clear to auscultation. Respiratory effort  normal. Cardiovascular system: S1 & S2 heard, RRR. No JVD, murmurs, rubs, gallops or clicks. 1+ pitting bilateral pedal edema. Not on telemetry. Gastrointestinal system: Abdomen is nondistended, soft and nontender. No organomegaly or masses felt. Normal bowel sounds heard. Central nervous system: Alert and oriented to person and partly to place. Left-sided visual deficit. Extremities: Moving right extremities well. Left-sided apraxia/hemiparesis. At least grade 3 x 5 power in left upper extremity and 2 x 5 power in left lower extremity. Skin: No rashes, lesions or ulcers Psychiatry: Judgement and insight appear poor. Mood & affect are flat.     Data Reviewed: I have personally reviewed following labs and imaging studies  CBC:  Recent Labs Lab 01/06/16 0725 01/06/16 1038 01/06/16 1217 01/09/16 0840  WBC  --   --  5.8 6.8  HGB 11.6* 8.8* 10.1* 10.4*  HCT 34.0* 26.0* 31.5* 32.2*  MCV  --   --  92.9 94.2  PLT  --   --  122* 702*   Basic Metabolic Panel:  Recent Labs Lab 01/06/16 1038 01/06/16 1217 01/07/16 0400 01/09/16 0335 01/09/16 0840 01/10/16 0854  NA 129* 131*  --  128* 130* 131*  K 4.4 4.5  --  4.9 4.7 5.1  CL  --  93*  --  92* 94* 91*  CO2  --  32  --  32 31 30  GLUCOSE  --  119*  --  108* 97 126*  BUN  --  19  --  23* 21* 20  CREATININE  --  0.83  --  0.86 0.86 0.81  CALCIUM  --  7.4*  --  7.2* 7.4* 8.1*  MG  --   --  2.0  --   --   --   PHOS  --   --  3.4  --   --   --    GFR: Estimated Creatinine Clearance: 91.3 mL/min (by C-G formula based on Cr of 0.81). Liver Function Tests: No results for input(s): AST, ALT, ALKPHOS, BILITOT, PROT, ALBUMIN in the last 168 hours. No results for input(s): LIPASE, AMYLASE in the last 168 hours. No results for input(s): AMMONIA in the last 168 hours. Coagulation Profile:  Recent Labs Lab 01/07/16 1052  INR 0.95   Cardiac Enzymes: No results for input(s): CKTOTAL, CKMB, CKMBINDEX, TROPONINI in the last 168  hours. BNP (last 3 results) No results for input(s): PROBNP in the last 8760 hours. HbA1C: No results for input(s): HGBA1C in the last 72 hours. CBG:  Recent Labs Lab 01/10/16 0633 01/10/16 1105 01/10/16 1614 01/10/16 2159 01/11/16 0627  GLUCAP 127* 124* 170* 111* 114*   Lipid Profile: No results for input(s): CHOL, HDL, LDLCALC, TRIG, CHOLHDL, LDLDIRECT in the last 72 hours. Thyroid Function Tests: No results for input(s): TSH, T4TOTAL, FREET4, T3FREE, THYROIDAB in the last 72 hours. Anemia Panel: No results for input(s): VITAMINB12, FOLATE, FERRITIN, TIBC, IRON, RETICCTPCT in the last 72 hours.  Sepsis Labs: No results for input(s): PROCALCITON, LATICACIDVEN in the last 168 hours.  Recent Results (from the past 240 hour(s))  Aerobic/Anaerobic Culture (surgical/deep wound)     Status: None (Preliminary result)   Collection Time: 01/06/16 10:10 AM  Result Value Ref Range Status   Specimen Description WOUND  Final   Special Requests RIGHT PARIETAL OCCIPITAL MASS  Final   Gram Stain   Final    MODERATE WBC PRESENT, PREDOMINANTLY MONONUCLEAR NO ORGANISMS SEEN Gram Stain Report Called to,Read Back By and Verified With: G CRAM,MD AT 1116 BY L BENFIELD    Culture   Final    NO GROWTH 2 DAYS NO ANAEROBES ISOLATED; CULTURE IN PROGRESS FOR 5 DAYS   Report Status PENDING  Incomplete         Radiology Studies: Ct Head Wo Contrast  01/10/2016  CLINICAL DATA:  75 year old male with metastatic lung cancer post craniotomy. Subsequent encounter. EXAM: CT HEAD WITHOUT CONTRAST TECHNIQUE: Contiguous axial images were obtained from the base of the skull through the vertex without intravenous contrast. COMPARISON:  01/09/2016 head CT.  12/13/2015 brain MR. FINDINGS: Post right parietal craniotomy. Gas and blood containing epidural collection below the craniotomy site with maximal thickness of 5.6 mm without significant change. Large right parietal lobe hematoma appears minimally more  prominent than on yesterday's examination now measuring 3.7 x 4.3 x 4.2 cm versus prior 3.2 x 3.7 x 4 cm. Surrounding vasogenic edema and mass effect upon the right lateral ventricle which is displaced inferiorly. MR detected intracranial metastatic lesions are not as well delineated on the present unenhanced CT. IMPRESSION: Post right parietal craniotomy. Gas and blood containing epidural collection below the craniotomy site with maximal thickness of 5.6 mm without significant change. Large right parietal lobe hematoma appears minimally more prominent than on yesterday's examination now measuring 3.7 x 4.3 x 4.2 cm versus prior 3.2 x 3.7 x 4 cm. Surrounding vasogenic edema and mass effect upon the right lateral ventricle which is displaced inferiorly. Electronically Signed   By: Genia Del M.D.   On: 01/10/2016 11:06  Scheduled Meds: . amLODipine  5 mg Oral Daily  . budesonide (PULMICORT) nebulizer solution  0.25 mg Nebulization BID  . cloNIDine  0.1 mg Oral BID  . dexamethasone  2 mg Oral Q6H  . famotidine  40 mg Oral BID  . furosemide  40 mg Intravenous BID  . guaiFENesin  400 mg Oral BID  . insulin aspart  0-9 Units Subcutaneous TID WC  . ipratropium-albuterol  3 mL Nebulization QID  . isosorbide mononitrate  30 mg Oral Daily  . levETIRAcetam  1,500 mg Oral BID  . metoprolol tartrate  50 mg Oral BID  . pramipexole  1 mg Oral QHS  . sodium chloride  2 g Oral TID WC   Continuous Infusions:    LOS: 5 days    Time spent: 30 minutes.    [name], MD [facility] Pager [phone]  If 7PM-7AM, please contact night-coverage [email or web address] Password [facility] 01/11/2016, 11:47 AM

## 2016-01-11 NOTE — Progress Notes (Signed)
Pt refused to wear yellow fall socks

## 2016-01-11 NOTE — Progress Notes (Signed)
Patient vomited undigested food. Patient given ordered medication. Will continue to monitor.

## 2016-01-12 ENCOUNTER — Telehealth: Payer: Self-pay | Admitting: Internal Medicine

## 2016-01-12 DIAGNOSIS — E440 Moderate protein-calorie malnutrition: Secondary | ICD-10-CM

## 2016-01-12 LAB — GLUCOSE, CAPILLARY
GLUCOSE-CAPILLARY: 128 mg/dL — AB (ref 65–99)
GLUCOSE-CAPILLARY: 92 mg/dL (ref 65–99)
Glucose-Capillary: 146 mg/dL — ABNORMAL HIGH (ref 65–99)
Glucose-Capillary: 107 mg/dL — ABNORMAL HIGH (ref 65–99)

## 2016-01-12 LAB — CBC
HCT: 28.6 % — ABNORMAL LOW (ref 39.0–52.0)
Hemoglobin: 9.3 g/dL — ABNORMAL LOW (ref 13.0–17.0)
MCH: 30.3 pg (ref 26.0–34.0)
MCHC: 32.5 g/dL (ref 30.0–36.0)
MCV: 93.2 fL (ref 78.0–100.0)
Platelets: 121 10*3/uL — ABNORMAL LOW (ref 150–400)
RBC: 3.07 MIL/uL — ABNORMAL LOW (ref 4.22–5.81)
RDW: 15.2 % (ref 11.5–15.5)
WBC: 6.2 10*3/uL (ref 4.0–10.5)

## 2016-01-12 LAB — BASIC METABOLIC PANEL
Anion gap: 4 — ABNORMAL LOW (ref 5–15)
BUN: 22 mg/dL — ABNORMAL HIGH (ref 6–20)
CALCIUM: 8.3 mg/dL — AB (ref 8.9–10.3)
CO2: 35 mmol/L — ABNORMAL HIGH (ref 22–32)
CREATININE: 0.75 mg/dL (ref 0.61–1.24)
Chloride: 93 mmol/L — ABNORMAL LOW (ref 101–111)
Glucose, Bld: 113 mg/dL — ABNORMAL HIGH (ref 65–99)
Potassium: 4.4 mmol/L (ref 3.5–5.1)
SODIUM: 132 mmol/L — AB (ref 135–145)

## 2016-01-12 MED ORDER — ENSURE ENLIVE PO LIQD
237.0000 mL | Freq: Two times a day (BID) | ORAL | Status: DC
  Administered 2016-01-12 – 2016-01-16 (×6): 237 mL via ORAL
  Filled 2016-01-12 (×12): qty 237

## 2016-01-12 NOTE — Progress Notes (Signed)
Palliative:  Mr. Hilbun is sitting up in recliner and his wife and daughter are at bedside. All extremely friendly and had no questions/concerns. Mr. Radle is alert and not confused as notes describe him over the weekend. He seems quite appropriate. He has made wishes clear for full aggressive care previously. Family heading out to lunch. I will continue to check in to support family as well and assess for readiness for further conversation.   Vinie Sill, NP Palliative Medicine Team Pager # [phone] (M-F 8a-5p) Team Phone # [phone] (Nights/Weekends)

## 2016-01-12 NOTE — Care Management Important Message (Signed)
Important Message  Patient Details  Name: [name] MRN: [id number] Date of Birth: 07-Jun-1941   Medicare Important Message Given:  Yes    Nathen May 01/12/2016, 11:23 AM

## 2016-01-12 NOTE — Consult Note (Signed)
   [facility] Inpatient Consult   01/12/2016  BENIAH MAGNAN June 11, 1941 [id number]   Patient screened for potential [facility] services. Patient is eligible for [facility] Care Management services under patient's Medicare  Plan and primary care provider.  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 Patient has since been approached by inpatient rehab and also with Palliative Care.  Patient is hopeful to discharge to inpatient rehab prior to returning to the community per chart.  If this plan changes and the patient should return to the community please place a [facility] consult or for questions contact:   Natividad Brood, RN BSN [facility] Liaison  [phone] business mobile phone Toll free office [phone]

## 2016-01-12 NOTE — Progress Notes (Signed)
Patient ID: [name], male   DOB: 1940/09/26, 75 y.o.   MRN: [id number] Patient is doing better this morning less confusion  Has a left hemiapraxia and a left-sided neglect but strength is 4-4+ out of 5 in upper extremity and 5 out of 5 in his lower extremity  Incision clean dry and intact  Continue to wean steroids mobilize with physical and occupational therapy we'll need placement. I do not see the patient leaving before the end of the week.

## 2016-01-12 NOTE — Progress Notes (Signed)
Physical Therapy Treatment Patient Details Name: [name] MRN: [id number] DOB: 1940-12-08 Today's Date: 01/12/2016    History of Present Illness Patient is a 75 y/o male with recurrent lung ca with brain mets, COPD, restless leg syndrome, anxiety presents s/p craniotomy with resection of a right occipital mass. Head CT- showing worsening venous infarction with mass effect    PT Comments    Patient with worsening left sided weakness and inattention today. Now requires assist of 2 to stand and for SPT to chair. Pt has no awareness of left side during functional mobility. Requires max multimoda cues to use LUE/LE during session. Education with wife to try to get pt to attend to left side visually and to use left side throughout the day. Will continue to follow.   Follow Up Recommendations  CIR     Equipment Recommendations  Wheelchair (measurements PT);Wheelchair cushion (measurements PT)    Recommendations for Other Services       Precautions / Restrictions Precautions Precautions: Fall Precaution Comments: left visual field deficits; left inattention Restrictions Weight Bearing Restrictions: No    Mobility  Bed Mobility Overal bed mobility: Needs Assistance Bed Mobility: Supine to Sit     Supine to sit: Max assist     General bed mobility comments: Assist with LLE and trunk to get to EOB. Pt with LLE off left side of bed, asking to get up on right side with BLEs straddled, no awareness of LLE.Posterior lean sitting EOB.  Transfers Overall transfer level: Needs assistance Equipment used: 2 person hand held assist Transfers: Sit to/from Stand Sit to Stand: Max assist;+2 physical assistance         General transfer comment: Assist of 2 to stand from EOB with max cues and assist. Pt not able to functionally use LLE in standing. SPT to chair with trunk flexion, with therapist stabilizing left knee and assist with pivoting LLE.   Ambulation/Gait Ambulation/Gait  assistance:  (Not able to today due to safety concerns.)               Stairs            Wheelchair Mobility    Modified Rankin (Stroke Patients Only) Modified Rankin (Stroke Patients Only) Pre-Morbid Rankin Score: No significant disability Modified Rankin: Severe disability     Balance Overall balance assessment: Needs assistance Sitting-balance support: Feet supported;Single extremity supported Sitting balance-Leahy Scale: Zero Sitting balance - Comments: Not able to sit EOB without assist today. Pt continually falling posteriorly, able to initiate trunk for anterior lean but fatigues resulting in posterior bias. Requires Mod-Min A for static sitting balance.  Postural control: Posterior lean Standing balance support: During functional activity Standing balance-Leahy Scale: Zero Standing balance comment: Not able to stand today without assist, pt with left knee buckling and poor trunk control in standing.                    Cognition Arousal/Alertness: Awake/alert Behavior During Therapy: WFL for tasks assessed/performed Overall Cognitive Status: Impaired/Different from baseline         Following Commands: Follows one step commands with increased time;Follows one step commands inconsistently Safety/Judgement: Decreased awareness of safety;Decreased awareness of deficits Awareness: Intellectual Problem Solving: Slow processing;Decreased initiation General Comments: Pt with no awareness to visual deficits.    Exercises General Exercises - Lower Extremity Long Arc Quad: Left;10 reps;Seated Hip Flexion/Marching: Left;10 reps;Seated    General Comments General comments (skin integrity, edema, etc.): Wife and daughter present at end  of session.      Pertinent Vitals/Pain Pain Assessment: No/denies pain    Home Living                      Prior Function            PT Goals (current goals can now be found in the care plan section)  Progress towards PT goals: Not progressing toward goals - comment (secondary to worsening weakness)    Frequency  Min 4X/week    PT Plan Current plan remains appropriate    Co-evaluation             End of Session Equipment Utilized During Treatment: Gait belt Activity Tolerance: Patient limited by fatigue Patient left: in chair;with call bell/phone within reach;with family/visitor present     Time: 1014-1040 PT Time Calculation (min) (ACUTE ONLY): 26 min  Charges:  $Therapeutic Activity: 23-37 mins                    G Codes:      Kemora Pinard A Arieon Scalzo 01/12/2016, 12:00 PM Wray Kearns, PT, DPT [phone]

## 2016-01-12 NOTE — Progress Notes (Signed)
Occupational Therapy Treatment Patient Details Name: [name] MRN: [id number] DOB: 1941-04-12 Today's Date: 01/12/2016    History of present illness Patient is a 75 y/o male with recurrent lung ca with brain mets, COPD, restless leg syndrome, anxiety presents s/p craniotomy with resection of a right occipital mass. Head CT- showing worsening venous infarction with mass effect   OT comments  Patient is making progress towards OT goals, will continue plan of care for now. Education regarding LUE exercises and inattention provided to patient's wife.   Follow Up Recommendations  CIR;Supervision/Assistance - 24 hour    Equipment Recommendations  Other (comment) (defer to CIR)    Recommendations for Other Services Rehab consult;Other (comment)    Precautions / Restrictions Precautions Precautions: Fall Precaution Comments: left visual field deficits; left inattention Restrictions Weight Bearing Restrictions: No    Mobility Bed Mobility Overal bed mobility: Needs Assistance Bed Mobility: Supine to Sit     Supine to sit: Max assist     General bed mobility comments: Assist with LLE and trunk to get to EOB. Pt with LLE off left side of bed, asking to get up on right side with BLEs straddled, no awareness of LLE.Posterior lean sitting EOB.  Transfers Overall transfer level: Needs assistance Equipment used: 2 person hand held assist Transfers: Sit to/from Stand Sit to Stand: Max assist;+2 physical assistance         General transfer comment: Assist of 2 to stand from EOB with max cues and assist. Pt not able to functionally use LLE in standing. SPT to chair with trunk flexion, with therapist stabilizing left knee and assist with pivoting LLE.     Balance Overall balance assessment: Needs assistance Sitting-balance support: Feet supported;Single extremity supported Sitting balance-Leahy Scale: Zero Sitting balance - Comments: Not able to sit EOB without assist today. Pt  continually falling posteriorly, able to initiate trunk for anterior lean but fatigues resulting in posterior bias. Requires Mod-Min A for static sitting balance.  Postural control: Posterior lean Standing balance support: During functional activity Standing balance-Leahy Scale: Zero Standing balance comment: Not able to stand today without assist, pt with left knee buckling and poor trunk control in standing.   ADL Overall ADL's : Needs assistance/impaired     Grooming: Wash/dry face;Set up;Sitting Grooming Details (indicate cue type and reason): cueing for thouroughness  General ADL Comments: Pt found seated in recliner with wife present. Educated pt and wife on importance of placing some items on left side and encouraging him to look and pay attention to left side. Therapist completed exercises to LUE (see exercises tab) and encouraged wife to perform elbow flexion/extension, wrist flexion/extension, and supination/pronation. Administerred stress ball and encouraged wife to provide cues to place ball in hand to squeeze.            Cognition   Behavior During Therapy: WFL for tasks assessed/performed Overall Cognitive Status: Impaired/Different from baseline          Following Commands: Follows one step commands with increased time;Follows one step commands inconsistently Safety/Judgement: Decreased awareness of safety;Decreased awareness of deficits Awareness: Intellectual Problem Solving: Slow processing;Decreased initiation General Comments: Pt with no awareness to visual deficits and Lt inattention.       Exercises General Exercises - Upper Extremity Shoulder Flexion: PROM;Left;10 reps;Seated Shoulder Extension: PROM;Left;10 reps;Seated Shoulder ABduction: PROM;Left;10 reps;Seated Shoulder ADduction: PROM;Left;10 reps;Seated Elbow Flexion: AAROM;Left;10 reps;Seated Elbow Extension: AAROM;Left;10 reps;Seated Wrist Flexion: AAROM;Left;10 reps;Seated Wrist Extension:  AAROM;Left;10 reps;Seated Digit Composite Flexion: AAROM;Left;10 reps;Seated Composite Extension:  AROM;Left;10 reps;Seated General Exercises - Lower Extremity Long Arc Quad: Left;10 reps;Seated Hip Flexion/Marching: Left;10 reps;Seated           Pertinent Vitals/ Pain       Pain Assessment: No/denies pain         Frequency Min 2X/week     Progress Toward Goals  OT Goals(current goals can now be found in the care plan section)  Progress towards OT goals: Progressing toward goals  Acute Rehab OT Goals Patient Stated Goal: none stated OT Goal Formulation: With patient/family Time For Goal Achievement: 01/23/16 Potential to Achieve Goals: Good  Plan Discharge plan remains appropriate          Activity Tolerance Patient tolerated treatment well   Patient Left in chair;with call bell/phone within reach     Time: 1032-1106 OT Time Calculation (min): 34 min  Charges: OT General Charges $OT Visit: 1 Procedure OT Treatments $Self Care/Home Management : 23-37 mins  Chrys Racer , MS, OTR/L, CLT  Pager: [phone]  01/12/2016, 12:24 PM

## 2016-01-12 NOTE — Progress Notes (Signed)
Initial Nutrition Assessment  DOCUMENTATION CODES:   Non-severe (moderate) malnutrition in context of chronic illness  INTERVENTION:    Ensure Enlive PO BID, each supplement provides 350 kcal and 20 grams of protein; mix Ensure with ice cream for better acceptance.  NUTRITION DIAGNOSIS:   Malnutrition related to chronic illness as evidenced by mild depletion of body fat, mild depletion of muscle mass.  GOAL:   Patient will meet greater than or equal to 90% of their needs  MONITOR:   PO intake, Supplement acceptance, Labs, Weight trends, I & O's  REASON FOR ASSESSMENT:   Consult Assessment of nutrition requirement/status  ASSESSMENT:   74-year-old gentleman who was admitted on 6/27 for craniotomy for right-sided right occipital lung met. He has a long-standing history of metastatic lung cancer undergone multiple stereotactic radiosurgical procedures 2 lesions in the brain has a new lesion right parietal occipital that is shown growth on serial MRIs.  Labs reviewed: sodium low CBG's: 215-186-146 Meds reviewed and include decadron and lasix. Weight seems to have increased over the past few months, likely related to edema.  Wife reports that patient does not want to drink Ensure at home. She tries to give him high protein foods and he refuses them, wanting junk food instead. He agreed to try chocolate Ensure while in the hospital.  Nutrition-Focused physical exam completed. Findings are mild fat depletion, mild-moderate muscle depletion, and moderate edema.  Patient with moderate PCM.  Diet Order:  Diet regular Room service appropriate?: Yes; Fluid consistency:: Thin  Skin:  Wound (see comment) (L elbow wound; surgical incision to head)  Last BM:  6/28  Height:   Ht Readings from Last 1 Encounters:  01/07/16 5' 9" (1.753 m)    Weight:   Wt Readings from Last 1 Encounters:  01/12/16 195 lb 9.6 oz (88.724 kg)    Ideal Body Weight:  72.7 kg  BMI:  Body mass index  is 28.87 kg/(m^2).  Estimated Nutritional Needs:   Kcal:  2100-2300  Protein:  115-130 gm  Fluid:  2.3 L  EDUCATION NEEDS:   Education needs addressed; discussed with wife ways to increase protein intake at home   Kimberly Harris, RD, LDN, CNSC Pager [phone] After Hours Pager [phone]  

## 2016-01-12 NOTE — Care Management Note (Signed)
Case Management Note  Patient Details  Name: [name] MRN: [id number] Date of Birth: [birth year]  Subjective/Objective:                    Action/Plan: Plan is for patient to discharge to [facility] when medically ready. CM continuing to follow for discharge needs.   Expected Discharge Date:                  Expected Discharge Plan:     In-House Referral:     Discharge planning Services     Post Acute Care Choice:    Choice offered to:     DME Arranged:    DME Agency:     HH Arranged:    HH Agency:     Status of Service:  In process, will continue to follow  If discussed at Long Length of Stay Meetings, dates discussed:    Additional Comments:  Pollie Friar, RN 01/12/2016, 3:34 PM

## 2016-01-12 NOTE — Telephone Encounter (Signed)
Pt in hospital...cx appt...the patient will call back to r/s

## 2016-01-12 NOTE — Progress Notes (Signed)
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 Post surgery, admitted to ICU and CCM provided medical consultation. Transferred to floor on 6/29 &TRH assumed consultation services. Worsening mental status 7/1 and CT showing worsening venous infarction with mass effect.   Assessment & Plan:   Active Problems:   Metastatic lung cancer (metastasis from lung to other site) [facility])   S/P craniotomy   Acute on chronic respiratory failure ([place])   Palliative care encounter   Acute confusion   Malnutrition of moderate degree   S/P R Parietal Mass Resection - milky white drainage during case from mass - all tumor -Management per neurosurgery/primary service. - Currently on dexamethasone and on Keppra for seizures. Remains on empiric levofloxacin-discussed with Dr. Saintclair Halsted, no clear indication and hence will DC. - Intra-Op cultures negative to date. - Intra-Op pathology of brain tumor: Metastatic squamous cell carcinoma consistent with lung primary - Repeat head CT 6/30: New 4.5 cm right parietal hematoma. Mild surrounding edema without midline shift. - CT heads 7/1: Gas and blood containing epidural collection below the  craniotomy site with maximal thickness of 5.6 mm without significant change. Enlarging right parietal lobe hematoma with surrounding vasogenic edema and mass effect upon right lateral ventricle. - Neurosurgery increased Decadron and added IV Lasix - Overall seems improved compared to 48 hours ago. Confusion improved. Decadron decreased 7/2.  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 No reported seizures.  RLS - Continue Mirapex.  Oxygen dependent and GOLD 3-4 COPD/chronic hypoxic respiratory failure - On home oxygen between 2-3 L/m continuously. Former tobacco abuse history. - Continue pulmonary toilet. - Seems stable.  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 and Dr. Lisbeth Renshaw - Palliative care consulted by CCM prior to moving out of ICU. As per palliative care consultation, continued aggressive care and full code. - Spouse specifically asked of me regarding his prognosis. Discussed at length and informed her of his overall poor prognosis and advised that patient and family reconsider CODE STATUS and further course.  Essential hypertension - controlled. Currently on amlodipine, clonidine, Imdur, metoprolol. Lisinopril discontinued this admission.  PVD/CAD - Remains on aspirin-decision regarding stopping due to brain hematoma deferred to neurosurgery.  Hyponatremia - ? SIADH. Periodically follow BMP. Agree with salt tablets. Also on IV Lasix and diuresing for the last couple of days. Stable.  Anemia of chronic disease - Stable.  Thrombocytopenia - Stable  Confusion - May be multifactorial related to brain surgery, brain metastases, new worsening brain hematoma, acute illness, hospitalization, pain medications. Hyponatremia seems stable and not very low as cause for his confusion. Treat the treatable. Minimize pain/sedative medications. Sleep hygiene. - Worsened confusion overnight  7/1-may be due to increasing steroids and ongoing  intracranial issues. Steroids were decreased. Confusion improved.  Hyperglycemia - Secondary to steroids. Currently on SSI. Reasonable inpatient control.  Lower extremity edema - May be multifactorial secondary to hypoalbuminemia, fluid resuscitation early on during hospitalization and? Chronic diastolic CHF. As per family, PCP was adjusting diuretic dose as outpatient. - 2-D echo 12/30/15: LVEF 55-60 percent. Moderate focal basal hypertrophy of the septum and mild hypertrophy of the posterior wall. - Trial of IV Lasix. -4.2 L since admission. Continue diuresis. Improving.  Nausea and vomiting - ? Related to problem #1. Treat supportively. Improving. As per patient and spouse, has not vomited since sometime yesterday.  Moderate malnutrition - Management per dietitian.   DVT prophylaxis: SCD's Code Status: Full Family Communication: Discussed with spouse at bedside Disposition Plan: to be determined.   Consultants:   CCM- signed off  Palliative Team  TRH are consultants  Procedures:   Stereotactic Radiosurgery for solitary brain metastasis on 01/05/16  Extubated  Antimicrobials:   Cefazolin 1 dose 6/27.  Levofloxacin 6/27 >. 7/1    Subjective: Headache mild and intermittent rated at 4/10 in severity. No vomiting since yesterday. Confusion improved. Leg edema decreasing.  Objective:  Filed Vitals:   01/11/16 2203 01/12/16 0137 01/12/16 0528 01/12/16 0727  BP: 114/68 128/76 136/89   Pulse: 96 58 90   Temp: 98.8 F (37.1 C) 97.7 F (36.5 C) 97.6 F (36.4 C)   TempSrc: Oral Axillary Oral   Resp: '18 18 18   '$ Height:      Weight:   88.724 kg (195 lb 9.6 oz)   SpO2: 100% 95% 100% 94%    Intake/Output Summary (Last 24 hours) at 01/12/16 1110 Last data filed at 01/12/16 1109  Gross per 24 hour  Intake    360 ml  Output   2000 ml  Net  -1640 ml   Filed Weights   01/07/16 2348 01/11/16 0500 01/12/16 0528    Weight: 88.678 kg (195 lb 8 oz) 89.676 kg (197 lb 11.2 oz) 88.724 kg (195 lb 9.6 oz)    Examination:  General exam: Pleasant elderly male, frail and chronically ill-looking lying comfortably lying propped up in bed. Appears to be progressively improving compared to 48 hours ago.Marland Kitchen Respiratory system: Clear to auscultation. Respiratory effort normal. Cardiovascular system: S1 & S2 heard, RRR. No JVD, murmurs, rubs, gallops or clicks. 1+ pitting bilateral pedal edema-decreasing. Not on telemetry. Gastrointestinal system: Abdomen is nondistended, soft and nontender. No organomegaly or masses felt. Normal bowel sounds heard. Central nervous system: Alert and oriented to person and partly to place. Left-sided visual deficit. Extremities: Moving right extremities well. Left-sided apraxia/hemiparesis. At least grade 4 x 5 power in left upper extremity and 2 x 5 power in left lower extremity. Skin: No rashes, lesions or ulcers Psychiatry: Judgement and insight appear poor. Mood & affect are flat.     Data Reviewed: I have personally reviewed following labs and imaging studies  CBC:  Recent Labs Lab 01/06/16 0725 01/06/16 1038 01/06/16 1217 01/09/16 0840 01/12/16 0527  WBC  --   --  5.8 6.8 6.2  HGB 11.6* 8.8* 10.1* 10.4* 9.3*  HCT 34.0* 26.0* 31.5* 32.2* 28.6*  MCV  --   --  92.9 94.2 93.2  PLT  --   --  122* 128* 160*   Basic Metabolic Panel:  Recent Labs Lab 01/07/16 0400 01/09/16 0335 01/09/16 0840 01/10/16 0854 01/11/16 1130 01/12/16 0527  NA  --  128* 130* 131* 132* 132*  K  --  4.9 4.7 5.1 4.3 4.4  CL  --  92* 94* 91* 92* 93*  CO2  --  32 31 30 36* 35*  GLUCOSE  --  108* 97 126* 136* 113*  BUN  --  23* 21* 20 21* 22*  CREATININE  --  0.86 0.86 0.81 0.82 0.75  CALCIUM  --  7.2* 7.4* 8.1* 7.9* 8.3*  MG 2.0  --   --   --   --   --   PHOS 3.4  --   --   --   --   --    GFR: Estimated Creatinine Clearance: 92 mL/min (by C-G formula based on Cr of 0.75). Liver  Function Tests:  Recent Labs Lab 01/11/16 1130  AST 23  ALT 15*  ALKPHOS 35*  BILITOT 0.3  PROT 4.4*  ALBUMIN 2.4*   No results for input(s): LIPASE, AMYLASE in the last 168 hours. No results for input(s): AMMONIA in the last 168 hours. Coagulation Profile:  Recent Labs Lab 01/07/16 1052  INR 0.95   Cardiac Enzymes: No results for input(s): CKTOTAL, CKMB, CKMBINDEX, TROPONINI in the last 168 hours. BNP (last 3 results) No results for input(s): PROBNP in the last 8760 hours. HbA1C: No results for input(s): HGBA1C in the last 72 hours. CBG:  Recent Labs Lab 01/11/16 0627 01/11/16 1203 01/11/16 1626 01/11/16 2207 01/12/16 0604  GLUCAP 114* 124* 215* 186* 146*   Lipid Profile: No results for input(s): CHOL, HDL, LDLCALC, TRIG, CHOLHDL, LDLDIRECT in the last 72 hours. Thyroid Function Tests: No results for input(s): TSH, T4TOTAL, FREET4, T3FREE, THYROIDAB in the last 72 hours. Anemia Panel: No results for input(s): VITAMINB12, FOLATE, FERRITIN, TIBC, IRON, RETICCTPCT in the last 72 hours.  Sepsis Labs: No results for input(s): PROCALCITON, LATICACIDVEN in the last 168 hours.  Recent Results (from the past 240 hour(s))  Aerobic/Anaerobic Culture (surgical/deep wound)     Status: None   Collection Time: 01/06/16 10:10 AM  Result Value Ref Range Status   Specimen Description WOUND  Final   Special Requests RIGHT PARIETAL OCCIPITAL MASS  Final   Gram Stain   Final    MODERATE WBC PRESENT, PREDOMINANTLY MONONUCLEAR NO ORGANISMS SEEN Gram Stain Report Called to,Read Back By and Verified With: G CRAM,MD AT 1116 BY L BENFIELD    Culture No growth aerobically or anaerobically.  Final   Report Status 01/11/2016 FINAL  Final         Radiology Studies: No results found.      Scheduled Meds: . amLODipine  5 mg Oral Daily  . budesonide (PULMICORT) nebulizer solution  0.25 mg Nebulization BID  . cloNIDine  0.1 mg Oral BID  . dexamethasone  2 mg Oral Q6H  .  famotidine  40 mg Oral BID  . furosemide  40 mg Intravenous BID  . guaiFENesin  400 mg Oral BID  . insulin aspart  0-9 Units Subcutaneous TID WC  . ipratropium-albuterol  3 mL Nebulization QID  . isosorbide mononitrate  30 mg Oral Daily  . levETIRAcetam  1,500 mg Oral BID  . metoprolol tartrate  50 mg Oral BID  . pantoprazole  40 mg Oral BID  . pramipexole  1 mg Oral QHS  . sodium chloride  2 g Oral TID WC   Continuous Infusions:    LOS: 6 days    Time spent: 30 minutes.    [facility], MD [facility] Pager [phone]  If 7PM-7AM, please contact night-coverage [email or web address] Password [email or web address] 01/12/2016, 11:10  AM    

## 2016-01-13 LAB — GLUCOSE, CAPILLARY
GLUCOSE-CAPILLARY: 129 mg/dL — AB (ref 65–99)
GLUCOSE-CAPILLARY: 123 mg/dL — AB (ref 65–99)
GLUCOSE-CAPILLARY: 120 mg/dL — AB (ref 65–99)
Glucose-Capillary: 106 mg/dL — ABNORMAL HIGH (ref 65–99)

## 2016-01-13 LAB — BASIC METABOLIC PANEL
Anion gap: 5 (ref 5–15)
BUN: 22 mg/dL — AB (ref 6–20)
CHLORIDE: 92 mmol/L — AB (ref 101–111)
CO2: 36 mmol/L — ABNORMAL HIGH (ref 22–32)
CREATININE: 0.68 mg/dL (ref 0.61–1.24)
Calcium: 7.9 mg/dL — ABNORMAL LOW (ref 8.9–10.3)
GFR calc Af Amer: >60 mL/min (ref >60)
GFR calc non Af Amer: >60 mL/min (ref >60)
GLUCOSE: 116 mg/dL — AB (ref 65–99)
POTASSIUM: 4.2 mmol/L (ref 3.5–5.1)
Sodium: 133 mmol/L — ABNORMAL LOW (ref 135–145)

## 2016-01-13 LAB — CBC
HCT: 26.7 % — ABNORMAL LOW (ref 39.0–52.0)
HEMOGLOBIN: 8.6 g/dL — AB (ref 13.0–17.0)
MCH: 30.6 pg (ref 26.0–34.0)
MCHC: 32.2 g/dL (ref 30.0–36.0)
MCV: 95 fL (ref 78.0–100.0)
Platelets: 120 10*3/uL — ABNORMAL LOW (ref 150–400)
RBC: 2.81 MIL/uL — AB (ref 4.22–5.81)
RDW: 15.2 % (ref 11.5–15.5)
WBC: 5.8 10*3/uL (ref 4.0–10.5)

## 2016-01-13 NOTE — Progress Notes (Signed)
CSW still following patient progress for SNF placement.  Percell Locus Jago Carton LCSWA [phone]

## 2016-01-13 NOTE — Progress Notes (Signed)
Pt given warm wash cloth, washed face. Pt refused bath.

## 2016-01-13 NOTE — Progress Notes (Signed)
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 Post surgery, admitted to ICU and CCM provided medical consultation. Transferred to floor on 6/29 &TRH assumed consultation services. Worsening mental status 7/1 and CT showing worsening venous infarction with mass effect. Mental status improved after reducing steroids. Diuresing for lower extremity edema. Plans for SNF and of this week.   Assessment & Plan:   Active Problems:   Metastatic lung cancer (metastasis from lung to other site) [facility])   S/P craniotomy   Acute on chronic respiratory failure ([place])   Palliative care encounter   Acute confusion   Malnutrition of moderate degree   S/P R Parietal Mass Resection - milky white drainage during case from mass - all tumor -Management per neurosurgery/primary service. - Currently on dexamethasone and on Keppra for seizures.  - Intra-Op cultures negative. - Intra-Op pathology of brain tumor: Metastatic squamous cell carcinoma consistent with lung primary - Repeat head CT 6/30: New 4.5 cm right parietal hematoma. Mild surrounding edema without midline shift. - CT heads 7/1: Gas and blood containing epidural collection  below the craniotomy site with maximal thickness of 5.6 mm without significant change. Enlarging right parietal lobe hematoma with surrounding vasogenic edema and mass effect upon right lateral ventricle. - Neurosurgery increased Decadron and added IV Lasix. However developed confusion attributed to increasing steroids since steroid dose was decreased. - Confusion seems to have resolved.   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 No reported seizures.  RLS - Continue Mirapex.  Oxygen dependent and GOLD 3-4 COPD/chronic hypoxic respiratory failure - On home oxygen between 2-3 L/m continuously. Former tobacco abuse history. - Continue pulmonary toilet. - Seems stable.  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 and Dr. Lisbeth Renshaw - Palliative care consulted by CCM prior to moving out of ICU. As per palliative care consultation, continued aggressive care and full code. - On 7/3, Spouse specifically asked of me regarding his prognosis. Discussed at length and informed her of his overall poor prognosis and advised that patient and family reconsider CODE STATUS and further course.  Essential hypertension - controlled. Currently on amlodipine, clonidine, Imdur, metoprolol. Lisinopril discontinued this admission.  PVD/CAD - Discussed with neurosurgery and discontinued aspirin on 7/1-resume when okay with neurosurgery.  Hyponatremia - ? SIADH. Periodically follow BMP. Agree with salt tablets. Also on IV Lasix and diuresing for the last couple of days. Stable and improving.  Anemia of chronic disease - Hemoglobin gradually drifting down, 10.4 > 9.3 > 8.6. Follow CBC in a.m. and transfuse if hemoglobin less than 7 g per DL.  Thrombocytopenia - Stable  Confusion - May be multifactorial related to brain surgery, brain metastases, new worsening brain hematoma, acute illness, hospitalization, pain  medications. Hyponatremia seems stable and not very low as cause for his confusion. Treat the treatable. Minimize pain/sedative medications. Sleep hygiene. - Worsened confusion overnight 7/1-may be due to increasing steroids and ongoing intracranial issues. Steroids were decreased. Confusion improved or resolved.  Hyperglycemia - Secondary to steroids. Currently on SSI. Reasonable inpatient control.  Lower extremity edema - May be multifactorial secondary to hypoalbuminemia, fluid resuscitation early on during hospitalization and? Chronic diastolic CHF. As per family, PCP was adjusting diuretic dose as outpatient. - 2-D echo 12/30/15: LVEF 55-60 percent. Moderate focal basal hypertrophy of the septum and mild hypertrophy of the posterior wall. - Trial of IV Lasix. -4.4 L since admission. Continue diuresis. Improving. Discussed with RN regarding strict intake and output charting. Still has some volume overload.  Nausea and vomiting - ? Related to problem #1. Treat supportively. States that he has not vomited since 01/11/16.  Moderate malnutrition - Management per dietitian.   DVT prophylaxis: SCD's Code Status: Full Family Communication: Discussed with daughter  at bedside Disposition Plan: to be determined >DC to SNF when medically ready, possibly towards the end of this week .   Consultants:   CCM- signed off  Palliative Team  TRH are consultants  Procedures:   Stereotactic Radiosurgery for solitary brain metastasis on 01/05/16  Extubated  Antimicrobials:   Cefazolin 1 dose 6/27.  Levofloxacin 6/27 >. 7/1    Subjective:  poor appetite otherwise no specific complaints. No headache or vomiting. As per RN, no acute issues.   Objective:  Filed Vitals:   01/13/16 0036 01/13/16 0519 01/13/16 0923 01/13/16 1306  BP: 127/73 118/76    Pulse: 92 95    Temp: 98.4 F (36.9 C) 98.3 F (36.8 C)    TempSrc: Oral Oral    Resp: 18 18    Height:      Weight:      SpO2: 98%  99% 98% 97%    Intake/Output Summary (Last 24 hours) at 01/13/16 1309 Last data filed at 01/13/16 1000  Gross per 24 hour  Intake   1340 ml  Output   1900 ml  Net   -560 ml   Filed Weights   01/07/16 2348 01/11/16 0500 01/12/16 0528  Weight: 88.678 kg (195 lb 8 oz) 89.676 kg (197 lb 11.2 oz) 88.724 kg (195 lb 9.6 oz)    Examination:  General exam: Pleasant elderly male, frail and chronically ill-looking lying comfortably propped up in bed. Appears to be progressively improving compared to 72 hours ago.Marland Kitchen Respiratory system: Clear to auscultation. Respiratory effort normal. Cardiovascular system: S1 & S2 heard, RRR. No JVD, murmurs, rubs, gallops or clicks. 1+ pitting bilateral pedal edema-decreasing.  Gastrointestinal system: Abdomen is nondistended, soft and nontender. No organomegaly or masses felt. Normal bowel sounds heard. Central nervous system: Alert and oriented. Left-sided visual deficit- better/? resolved. Extremities: Moving right extremities well. Left-sided apraxia/hemiparesis. At least grade 4 x 5 power in left upper extremity and 2 x 5 power in left lower extremity. Skin: No rashes, lesions or ulcers Psychiatry: Judgement and insight appear poor. Mood & affect are flat.     Data Reviewed: I have personally reviewed following labs and imaging studies  CBC:  Recent Labs Lab 01/09/16 0840 01/12/16 0527 01/13/16 0430  WBC 6.8 6.2 5.8  HGB 10.4* 9.3* 8.6*  HCT 32.2* 28.6* 26.7*  MCV 94.2 93.2 95.0  PLT 128* 121* 101*   Basic Metabolic Panel:  Recent Labs Lab 01/07/16 0400  01/09/16 0840 01/10/16 0854 01/11/16 1130  01/12/16 0527 01/13/16 0430  NA  --   < > 130* 131* 132* 132* 133*  K  --   < > 4.7 5.1 4.3 4.4 4.2  CL  --   < > 94* 91* 92* 93* 92*  CO2  --   < > 31 30 36* 35* 36*  GLUCOSE  --   < > 97 126* 136* 113* 116*  BUN  --   < > 21* 20 21* 22* 22*  CREATININE  --   < > 0.86 0.81 0.82 0.75 0.68  CALCIUM  --   < > 7.4* 8.1* 7.9* 8.3* 7.9*  MG  2.0  --   --   --   --   --   --   PHOS 3.4  --   --   --   --   --   --   < > = values in this interval not displayed. GFR: Estimated Creatinine Clearance: 92 mL/min (by C-G formula based on Cr of 0.68). Liver Function Tests:  Recent Labs Lab 01/11/16 1130  AST 23  ALT 15*  ALKPHOS 35*  BILITOT 0.3  PROT 4.4*  ALBUMIN 2.4*   No results for input(s): LIPASE, AMYLASE in the last 168 hours. No results for input(s): AMMONIA in the last 168 hours. Coagulation Profile:  Recent Labs Lab 01/07/16 1052  INR 0.95   Cardiac Enzymes: No results for input(s): CKTOTAL, CKMB, CKMBINDEX, TROPONINI in the last 168 hours. BNP (last 3 results) No results for input(s): PROBNP in the last 8760 hours. HbA1C: No results for input(s): HGBA1C in the last 72 hours. CBG:  Recent Labs Lab 01/12/16 1139 01/12/16 1620 01/12/16 2132 01/13/16 0629 01/13/16 1156  GLUCAP 128* 107* 92 106* 129*   Lipid Profile: No results for input(s): CHOL, HDL, LDLCALC, TRIG, CHOLHDL, LDLDIRECT in the last 72 hours. Thyroid Function Tests: No results for input(s): TSH, T4TOTAL, FREET4, T3FREE, THYROIDAB in the last 72 hours. Anemia Panel: No results for input(s): VITAMINB12, FOLATE, FERRITIN, TIBC, IRON, RETICCTPCT in the last 72 hours.  Sepsis Labs: No results for input(s): PROCALCITON, LATICACIDVEN in the last 168 hours.  Recent Results (from the past 240 hour(s))  Aerobic/Anaerobic Culture (surgical/deep wound)     Status: None   Collection Time: 01/06/16 10:10 AM  Result Value Ref Range Status   Specimen Description WOUND  Final   Special Requests RIGHT PARIETAL OCCIPITAL MASS  Final   Gram Stain   Final    MODERATE WBC PRESENT, PREDOMINANTLY MONONUCLEAR NO ORGANISMS SEEN Gram Stain Report Called to,Read Back By and Verified With: G CRAM,MD AT 1116 BY L BENFIELD    Culture No growth aerobically or anaerobically.  Final   Report Status 01/11/2016 FINAL  Final         Radiology Studies: No  results found.      Scheduled Meds: . amLODipine  5 mg Oral Daily  . budesonide (PULMICORT) nebulizer solution  0.25 mg Nebulization BID  . cloNIDine  0.1 mg Oral BID  . dexamethasone  2 mg Oral Q6H  . famotidine  40 mg Oral BID  . feeding supplement (ENSURE ENLIVE)  237 mL Oral BID BM  . furosemide  40 mg Intravenous BID  . guaiFENesin  400 mg Oral BID  . insulin aspart  0-9 Units Subcutaneous TID WC  . ipratropium-albuterol  3 mL Nebulization QID  . isosorbide mononitrate  30 mg Oral Daily  . levETIRAcetam  1,500 mg Oral BID  . metoprolol tartrate  50 mg Oral BID  . pantoprazole  40 mg Oral BID  . pramipexole  1 mg Oral QHS  . sodium chloride  2 g Oral TID WC   Continuous Infusions:    LOS: 7 days    Time spent: 20 minutes.    [facility], MD [facility] Pager [phone]  If 7PM-7AM, please contact night-coverage [email or web address] Password [email or web address] 01/13/2016, 1:09 PM

## 2016-01-13 NOTE — Progress Notes (Signed)
rn had to hold pts drink while he drank, pt too shaky in arm to hold cut and coordinate movements.

## 2016-01-13 NOTE — Progress Notes (Signed)
Physical Therapy Treatment Patient Details Name: [name] MRN: [id number] DOB: 1941-02-17 Today's Date: 01/13/2016    History of Present Illness Patient is a 75 y/o male with recurrent lung ca with brain mets, COPD, restless leg syndrome, anxiety presents s/p craniotomy with resection of a right occipital mass. Head CT- showing worsening venous infarction with mass effect    PT Comments    Patient demonstrates worsening left inattention and now reporting decreased sensation through RLE. Decreased initiation noted during mobility. Requires assist of 2 to SPT to chair with max multimodal cues. Difficulty sequencing movements and with initiation. Attempted standing from chair however pt unable even with assist of 2. Able to visually attend to left side but not functionally use LUE/LE. Discharge recommendation updated to short term SNF based on lack of progress and inability to tolerate intensity of activity. Will follow.   Follow Up Recommendations  SNF     Equipment Recommendations  Wheelchair (measurements PT);Wheelchair cushion (measurements PT)    Recommendations for Other Services       Precautions / Restrictions Precautions Precautions: Fall Precaution Comments: left visual field deficits; left inattention Restrictions Weight Bearing Restrictions: No    Mobility  Bed Mobility Overal bed mobility: Needs Assistance Bed Mobility: Supine to Sit     Supine to sit: Max assist;HOB elevated     General bed mobility comments: Assist with LLE and trunk to get to EOB. No initiation of LLE. Posterior lean sitting EOB.  Transfers Overall transfer level: Needs assistance Equipment used: 2 person hand held assist Transfers: Sit to/from [employer] transfer comment: Assist of 2 to stand from EOB with max cues and assist. SPT to chair with forward flexion and increased hip/knee flexion, therapist pivoting LEs as pt not able to initiate  movement. Attempted to stand from chair x3 however no initiation noted through either LEs and only able to clear bottom. Posterior lean.  Ambulation/Gait                 Stairs            Wheelchair Mobility    Modified Rankin (Stroke Patients Only) Modified Rankin (Stroke Patients Only) Pre-Morbid Rankin Score: No significant disability Modified Rankin: Severe disability     Balance Overall balance assessment: Needs assistance Sitting-balance support: Feet supported;Single extremity supported Sitting balance-Leahy Scale: Poor Sitting balance - Comments: Able to sit EOB without support today for a few minutes; anterior lean with bil forearms resting on thighs. Posterior bias otherwise.  Postural control: Posterior lean Standing balance support: During functional activity Standing balance-Leahy Scale: Zero                      Cognition Arousal/Alertness: Awake/alert Behavior During Therapy: WFL for tasks assessed/performed Overall Cognitive Status: Impaired/Different from baseline Area of Impairment: Awareness;Problem solving;Safety/judgement       Following Commands: Follows one step commands with increased time;Follows one step commands inconsistently Safety/Judgement: Decreased awareness of safety;Decreased awareness of deficits Awareness: Intellectual Problem Solving: Slow processing;Decreased initiation;Difficulty sequencing;Requires verbal cues General Comments: Pt with no awareness to visual deficits and Lt inattention.     Exercises      General Comments General comments (skin integrity, edema, etc.): Family left room for session. Focused on trying to get pt to attend to left side and functionally use LUE/LLE but only minimal motor movement noted through LLE, none through LUE despite max cues.  Pertinent Vitals/Pain Pain Assessment: No/denies pain    Home Living                      Prior Function            PT Goals  (current goals can now be found in the care plan section) Progress towards PT goals: Not progressing toward goals - comment    Frequency  Min 4X/week    PT Plan Discharge plan needs to be updated    Co-evaluation             End of Session Equipment Utilized During Treatment: Gait belt Activity Tolerance: Patient limited by fatigue Patient left: in chair;with call bell/phone within reach;with chair alarm set     Time: 0569-7948 PT Time Calculation (min) (ACUTE ONLY): 23 min  Charges:  $Therapeutic Activity: 23-37 mins                    G Codes:      Malia Corsi A Bodhi Stenglein 01/13/2016, 11:50 AM Wray Kearns, PT, DPT [phone]

## 2016-01-13 NOTE — Progress Notes (Signed)
Patient ID: [name], male   DOB: 1941/02/10, 75 y.o.   MRN: [id number] BP 118/76 mmHg  Pulse 95  Temp(Src) 98.3 F (36.8 C) (Oral)  Resp 18  Ht '5\' 9"'$  (1.753 m)  Wt 88.724 kg (195 lb 9.6 oz)  BMI 28.87 kg/m2  SpO2 99% Alert And oriented x 4 Slight weakness on left side in upper extremity Moves all extremities Wound is clean, dry, no signs of infection.  Continue pt and ot

## 2016-01-14 ENCOUNTER — Inpatient Hospital Stay (HOSPITAL_COMMUNITY): Payer: Medicare Other

## 2016-01-14 DIAGNOSIS — J9611 Chronic respiratory failure with hypoxia: Secondary | ICD-10-CM

## 2016-01-14 DIAGNOSIS — Z7189 Other specified counseling: Secondary | ICD-10-CM

## 2016-01-14 DIAGNOSIS — I10 Essential (primary) hypertension: Secondary | ICD-10-CM

## 2016-01-14 LAB — URINALYSIS, ROUTINE W REFLEX MICROSCOPIC
Bilirubin Urine: NEGATIVE
GLUCOSE, UA: NEGATIVE mg/dL
HGB URINE DIPSTICK: NEGATIVE
Ketones, ur: NEGATIVE mg/dL
Leukocytes, UA: NEGATIVE
Nitrite: NEGATIVE
PH: 7.5 (ref 5.0–8.0)
Protein, ur: NEGATIVE mg/dL
SPECIFIC GRAVITY, URINE: 1.013 (ref 1.005–1.030)

## 2016-01-14 LAB — BASIC METABOLIC PANEL
ANION GAP: 5 (ref 5–15)
BUN: 21 mg/dL — ABNORMAL HIGH (ref 6–20)
CALCIUM: 8 mg/dL — AB (ref 8.9–10.3)
CO2: 36 mmol/L — AB (ref 22–32)
CREATININE: 0.69 mg/dL (ref 0.61–1.24)
Chloride: 92 mmol/L — ABNORMAL LOW (ref 101–111)
GLUCOSE: 114 mg/dL — AB (ref 65–99)
Potassium: 4.1 mmol/L (ref 3.5–5.1)
Sodium: 133 mmol/L — ABNORMAL LOW (ref 135–145)

## 2016-01-14 LAB — GLUCOSE, CAPILLARY
GLUCOSE-CAPILLARY: 96 mg/dL (ref 65–99)
Glucose-Capillary: 109 mg/dL — ABNORMAL HIGH (ref 65–99)
Glucose-Capillary: 116 mg/dL — ABNORMAL HIGH (ref 65–99)
Glucose-Capillary: 190 mg/dL — ABNORMAL HIGH (ref 65–99)

## 2016-01-14 LAB — CBC
HEMATOCRIT: 27.8 % — AB (ref 39.0–52.0)
Hemoglobin: 8.9 g/dL — ABNORMAL LOW (ref 13.0–17.0)
MCH: 30.1 pg (ref 26.0–34.0)
MCHC: 32 g/dL (ref 30.0–36.0)
MCV: 93.9 fL (ref 78.0–100.0)
PLATELETS: 121 10*3/uL — AB (ref 150–400)
RBC: 2.96 MIL/uL — ABNORMAL LOW (ref 4.22–5.81)
RDW: 15.3 % (ref 11.5–15.5)
WBC: 5.6 10*3/uL (ref 4.0–10.5)

## 2016-01-14 MED ORDER — TAMSULOSIN HCL 0.4 MG PO CAPS
0.4000 mg | ORAL_CAPSULE | Freq: Every day | ORAL | Status: DC
  Administered 2016-01-14 – 2016-01-16 (×3): 0.4 mg via ORAL
  Filled 2016-01-14 (×3): qty 1

## 2016-01-14 MED ORDER — DEXAMETHASONE SODIUM PHOSPHATE 4 MG/ML IJ SOLN
6.0000 mg | Freq: Four times a day (QID) | INTRAMUSCULAR | Status: AC
  Administered 2016-01-14 – 2016-01-15 (×2): 6 mg via INTRAVENOUS
  Filled 2016-01-14 (×2): qty 2

## 2016-01-14 MED ORDER — IPRATROPIUM-ALBUTEROL 0.5-2.5 (3) MG/3ML IN SOLN
3.0000 mL | RESPIRATORY_TRACT | Status: DC | PRN
Start: 2016-01-14 — End: 2016-01-16

## 2016-01-14 MED ORDER — IPRATROPIUM-ALBUTEROL 0.5-2.5 (3) MG/3ML IN SOLN
3.0000 mL | Freq: Three times a day (TID) | RESPIRATORY_TRACT | Status: DC
  Administered 2016-01-14 – 2016-01-16 (×7): 3 mL via RESPIRATORY_TRACT
  Filled 2016-01-14 (×7): qty 3

## 2016-01-14 MED ORDER — FLEET ENEMA 7-19 GM/118ML RE ENEM: 1.0000 | ENEMA | Freq: Once | RECTAL | Status: DC

## 2016-01-14 MED ORDER — BISACODYL 10 MG RE SUPP
10.0000 mg | Freq: Once | RECTAL | Status: AC
  Administered 2016-01-14: 10 mg via RECTAL
  Filled 2016-01-14: qty 1

## 2016-01-14 MED ORDER — DOCUSATE SODIUM 100 MG PO CAPS
100.0000 mg | ORAL_CAPSULE | Freq: Two times a day (BID) | ORAL | Status: DC
  Administered 2016-01-14 – 2016-01-16 (×4): 100 mg via ORAL
  Filled 2016-01-14 (×4): qty 1

## 2016-01-14 NOTE — Progress Notes (Signed)
Dr Saintclair Halsted made aware of pt urinary retention

## 2016-01-14 NOTE — Progress Notes (Signed)
Subjective: Patient reports Nonfilling as well noticed decline and left sided strength and function.  Objective: Vital signs in last 24 hours: Temp:  [98.4 F (36.9 C)-98.9 F (37.2 C)] 98.6 F (37 C) (07/05 1725) Pulse Rate:  [67-85] 80 (07/05 1725) Resp:  [18-22] 22 (07/05 1725) BP: (109-165)/(69-95) 165/90 mmHg (07/05 1725) SpO2:  [96 %-100 %] 99 % (07/05 1725) Weight:  [83.371 kg (183 lb 12.8 oz)] 83.371 kg (183 lb 12.8 oz) (07/05 0400)  Intake/Output from previous day: 07/04 0701 - 07/05 0700 In: 480 [P.O.:480] Out: 3925 [Urine:3925] Intake/Output this shift: Total I/O In: -  Out: 2300 [Urine:2300]  Awake alert oriented increased left hemiparesis and hemiapraxia  Lab Results:  Recent Labs  01/13/16 0430 01/14/16 0500  WBC 5.8 5.6  HGB 8.6* 8.9*  HCT 26.7* 27.8*  PLT 120* 121*   BMET  Recent Labs  01/13/16 0430 01/14/16 0500  NA 133* 133*  K 4.2 4.1  CL 92* 92*  CO2 36* 36*  GLUCOSE 116* 114*  BUN 22* 21*  CREATININE 0.68 0.69  CALCIUM 7.9* 8.0*    Studies/Results: No results found.  Assessment/Plan: We'll order another head CT in this evening to evaluate the status of his intracerebral hemorrhage venous infarction. I will bump up his steroids.  Continue physical and occupational therapy appreciate palliative cares helping consult with this patient.  LOS: 8 days     Delmar Dondero P 01/14/2016, 6:00 PM

## 2016-01-14 NOTE — Clinical Social Work Note (Signed)
CSW presented bed offers to patient's wife, she would like [address] or [facility].  [place] [facility] can accept patient once he is medically ready for discharge and orders have been received.  CSW to continue to follow patient's progress throughout discharge planning.  Jones Broom. [place], MSW, [place] 01/14/2016 11:15 AM

## 2016-01-14 NOTE — Progress Notes (Signed)
Transport at bedside to take pt to xray, stating he needs to use the bedpan again. rn made aware. rn placed pt on bedban. Asked transport to come back in 20 minutes.

## 2016-01-14 NOTE — Progress Notes (Signed)
Pt this morning around 0800 was able to lift left leg off of the bed, at 1100 when physical therapy came to work with pt, pt was unable to lift left leg off bed. PT and rn agree that pt is physically declining and not improving. Pt over the day has seemed to get more confused. Pt has condom cath on and usually puts out a large amount of urine throughout the day. Pt noted to rn that he had not put out any urine in some time. Bladder scan was performed, greater than 853m in bladder.

## 2016-01-14 NOTE — NC FL2 (Signed)
[place] LEVEL OF CARE SCREENING TOOL     IDENTIFICATION  Patient Name: [name] Birthdate: 1941-02-09 Sex: male Admission Date (Current Location): 01/06/2016  [facility] [place] and [place] Number:  Herbalist and Address:  [facility] [place] [address], [place], [place] [address]      Provider Number: [id number]  Attending Physician Name and Address:  Kary Kos, MD  Relative Name and Phone Number:  [name], spouse, [phone]    Current Level of Care: Hospital Recommended Level of Care: [place] Prior Approval Number:    Date Approved/Denied:   PASRR Number: [id number]  Discharge Plan: SNF    Current Diagnoses: Patient Active Problem List   Diagnosis Date Noted  . Malnutrition of moderate degree 01/12/2016  . Palliative care encounter   . Acute confusion   . S/P craniotomy 01/06/2016  . Acute on chronic respiratory failure ([place]) 01/06/2016  . COPD exacerbation ([place]) 12/30/2015  . Acute exacerbation of COPD with asthma ([place]) 12/30/2015  . Acute exacerbation of CHF (congestive heart failure) ([place]) 12/30/2015  . Leukocytosis 12/30/2015  . COPD mixed type ([place]) 12/25/2015  . Edema 12/25/2015  . New onset seizure ([place])   . Malignant neoplasm of lung ([place])   . Benign essential HTN   . Acute left hemiparesis ([place])   . Left hemiparesis ([place]) 12/13/2015  . Respiratory failure ([place]) 12/13/2015  . Brain metastases ([place])   . Volume overload 09/26/2015  . Hand foot syndrome 09/26/2015  . Malignant neoplasm of hilus of lung ([place]) 09/25/2015  . Unintentional weight loss 09/10/2015  . Hypertension 09/10/2015  . Encounter for antineoplastic chemotherapy 07/20/2015  . 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 Metastatic lung cancer (metastasis from lung to other site) [name])   . Poor venous access   . Epistaxis 05/30/2015  . Extravasation, infusion or chemotherapeutic agent 05/09/2015  .  Hypokalemia 04/02/2015  . Constipation 01/29/2015  . Insomnia 01/29/2015  . Cancer associated pain 12/30/2014  . Rash 12/30/2014  . Syncope 12/30/2014  . Encounter for antineoplastic immunotherapy 12/16/2014  . UTI (lower urinary tract infection) 11/11/2014  . Sepsis ([place]) 11/11/2014  . Left arm cellulitis   . Blood poisoning ([place])   . Dehydration 10/12/2014  . Renal stone 10/05/2014  . Pyelonephritis 10/05/2014  . Chemotherapy induced neutropenia ([place]) 09/18/2014  . Dyspnea 09/11/2014  . Brain metastasis ([place]) 09/11/2014  . Tachycardia 06/21/2014  . Carotid stenosis 05/01/2014  . Aftercare following surgery of the circulatory system 05/01/2014  . Weakness-Hand and  Legs 05/01/2014  . Coronary atherosclerosis of native coronary artery 03/07/2014  . Essential hypertension, benign 03/07/2014  . Tobacco use disorder 03/07/2014  . Neoplasm of upper lobe of right lung ([place]) 10/26/2012  . Peripheral vascular disease, unspecified ([place])   . COPD (chronic obstructive pulmonary disease) ([place]) 11/16/2011  . Occlusion and stenosis of carotid artery without mention of cerebral infarction 05/12/2011    Orientation RESPIRATION BLADDER Height & Weight     Self, Time, Situation, Place  O2 Continent Weight: 183 lb 12.8 oz (83.371 kg) Height:  '5\' 9"'$  (175.3 cm)  BEHAVIORAL SYMPTOMS/MOOD NEUROLOGICAL BOWEL NUTRITION STATUS      Continent Diet  AMBULATORY STATUS COMMUNICATION OF NEEDS Skin   Limited Assist Verbally Surgical wounds                       Personal Care Assistance Level of Assistance  Bathing, Feeding, Dressing  Bathing Assistance: Limited assistance Feeding assistance: Limited assistance Dressing Assistance: Limited assistance     Functional Limitations [place]  PT (By licensed PT)     PT Frequency: 5x/week              Contractures Contractures Info: Not present    Additional Factors Info  Code Status,  Allergies, Insulin Sliding Scale Code Status Info: Full Allergies Info: NKA   Insulin Sliding Scale Info: insulin aspart (novoLOG) injection 2-6 Units       Current Medications (01/14/2016):  This is the current hospital active medication list Current Facility-Administered Medications  Medication Dose Route Frequency Provider Last Rate Last Dose  . acetaminophen (TYLENOL) tablet 650 mg  650 mg Oral Q6H PRN Modena Jansky, MD   650 mg at 01/10/16 1553  . amLODipine (NORVASC) tablet 5 mg  5 mg Oral Daily Kary Kos, MD   5 mg at 01/14/16 1023  . budesonide (PULMICORT) nebulizer solution 0.25 mg  0.25 mg Nebulization BID Brand Males, MD   0.25 mg at 01/14/16 0742  . clonazePAM (KLONOPIN) tablet 0.5 mg  0.5 mg Oral BID PRN Kary Kos, MD   0.5 mg at 01/12/16 2132  . cloNIDine (CATAPRES) tablet 0.1 mg  0.1 mg Oral BID Kary Kos, MD   0.1 mg at 01/14/16 1024  . dexamethasone (DECADRON) tablet 2 mg  2 mg Oral Q6H Kary Kos, MD   2 mg at 01/14/16 0606  . famotidine (PEPCID) tablet 40 mg  40 mg Oral BID Kary Kos, MD   40 mg at 01/14/16 1023  . feeding supplement (ENSURE ENLIVE) (ENSURE ENLIVE) liquid 237 mL  237 mL Oral BID BM Kary Kos, MD   237 mL at 01/13/16 1419  . furosemide (LASIX) injection 40 mg  40 mg Intravenous BID Modena Jansky, MD   40 mg at 01/14/16 0837  . guaiFENesin tablet 400 mg  400 mg Oral BID Kary Kos, MD   400 mg at 01/14/16 1024  . HYDROcodone-acetaminophen (NORCO) 7.5-325 MG per tablet 0.5 tablet  0.5 tablet Oral Q8H PRN Modena Jansky, MD   0.5 tablet at 01/12/16 2132  . insulin aspart (novoLOG) injection 0-9 Units  0-9 Units Subcutaneous TID WC Modena Jansky, MD   1 Units at 01/13/16 1200  . ipratropium-albuterol (DUONEB) 0.5-2.5 (3) MG/3ML nebulizer solution 3 mL  3 mL Nebulization QID Kary Kos, MD   3 mL at 01/14/16 0742  . isosorbide mononitrate (IMDUR) 24 hr tablet 30 mg  30 mg Oral Daily Kary Kos, MD   30 mg at 01/14/16 1023  . labetalol  (NORMODYNE,TRANDATE) injection 10 mg  10 mg Intravenous Q2H PRN Modena Jansky, MD      . levETIRAcetam (KEPPRA) tablet 1,500 mg  1,500 mg Oral BID Kary Kos, MD   1,500 mg at 01/14/16 1023  . metoprolol (LOPRESSOR) tablet 50 mg  50 mg Oral BID Kary Kos, MD   50 mg at 01/14/16 1023  . ondansetron (ZOFRAN) tablet 4 mg  4 mg Oral Q4H PRN Kary Kos, MD       Or  . ondansetron [facility]) injection 4 mg  4 mg Intravenous Q4H PRN Kary Kos, MD   4 mg at 01/10/16 1852  . pantoprazole (PROTONIX) EC tablet 40 mg  40 mg Oral BID Modena Jansky, MD   40 mg at 01/14/16 1023  . pramipexole (  MIRAPEX) tablet 1 mg  1 mg Oral QHS Kary Kos, MD   1 mg at 01/13/16 2157  . promethazine (PHENERGAN) tablet 12.5-25 mg  12.5-25 mg Oral Q4H PRN Kary Kos, MD   25 mg at 01/11/16 1012  . sodium chloride (OCEAN) 0.65 % nasal spray 1 spray  1 spray Each Nare PRN Kary Kos, MD      . sodium chloride flush (NS) 0.9 % injection 10-40 mL  10-40 mL Intracatheter PRN Kary Kos, MD   10 mL at 01/14/16 0459  . sodium chloride tablet 2 g  2 g Oral TID WC Kary Kos, MD   2 g at 01/14/16 4591   Facility-Administered Medications Ordered in Other Encounters  Medication Dose Route Frequency Provider Last Rate Last Dose  . sodium chloride 0.9 % injection 10 mL  10 mL Intracatheter PRN Curt Bears, MD   10 mL at 01/29/15 3685     Discharge Medications: Please see discharge summary for a list of discharge medications.  Relevant Imaging Results:  Relevant Lab Results:   Additional Information SSN: [id number], [place], [place]

## 2016-01-14 NOTE — Progress Notes (Signed)
Pt was on beside commode earlier today, PT place pt. Pt now asking to get on [facility] again, but earlier today pt said he was not even able to stand and pivot from recliner to bed, had to use lift. Now pt will be placed on bedpan to have bowel movement.

## 2016-01-14 NOTE — Progress Notes (Signed)
Pt moved back from chair to bed using lift.

## 2016-01-14 NOTE — Progress Notes (Signed)
Wife at bedside assisting pt to eat. Wife reports pt normally has to have assistance with feeding and that this is not a new finding. Pt refusing SCDs.

## 2016-01-14 NOTE — Progress Notes (Signed)
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 Post surgery, admitted to ICU and CCM provided medical consultation. Transferred to floor on 6/29 &TRH assumed consultation services. Worsening mental status 7/1 and CT showing worsening venous infarction with mass effect. Mental status improved after reducing steroids. Diuresing for lower extremity edema. Plans for SNF and of this week.  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 - Currently on dexamethasone and on Keppra for seizures.  - Intra-Op cultures negative. - Intra-Op pathology of brain tumor: Metastatic squamous cell carcinoma consistent with lung primary - Repeat head CT 6/30: New 4.5 cm right parietal hematoma. Mild surrounding edema without midline shift. - CT heads 7/1: Gas and blood containing epidural collection below the craniotomy site with maximal thickness of 5.6 mm without significant change. Enlarging right parietal lobe hematoma with surrounding vasogenic edema and mass effect upon  right lateral ventricle. - Neurosurgery increased Decadron and added IV Lasix. However developed confusion attributed to increasing steroids since steroid dose was decreased. - Confusion seems to have stabilized/resolved.   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 No reported seizures.  RLS - Continue Mirapex.  Oxygen dependent and GOLD 3-4 COPD/chronic hypoxic respiratory failure - On home oxygen between 2-3 L/m continuously. Former tobacco abuse history. - Continue pulmonary toilet. - Seems stable.  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 and Dr. Lisbeth Renshaw - Palliative care consulted by CCM prior to moving out of ICU. As per palliative care consultation, continued aggressive care and full code. - On 7/5, Spouse specifically asked of me regarding his prognosis. Discussed at length and informed her of his overall poor prognosis and advised that patient and family reconsider CODE STATUS and further course.  Essential hypertension - controlled. Currently on amlodipine, clonidine, Imdur, metoprolol.  -Lisinopril discontinued this admission.  PVD/CAD - Discussed with neurosurgery and discontinued aspirin on 7/1-resume when okay with neurosurgery.  Hyponatremia - ? SIADH. Periodically follow BMP.  -Agree with salt tablets. Also on IV Lasix since 01/10/16 - Stable and improving.  Constipation -add colace and bisacodyl  Anemia of chronic disease - Hemoglobin gradually drifting down, 10.4 > 9.3 > 8.6.  -Follow CBC and transfuse if hemoglobin less than 7 g per DL. -Baseline hemoglobin 9-10  Thrombocytopenia - Stable -check B12, RBC folate  Confusion - May be multifactorial related to brain surgery, brain metastases, new worsening brain hematoma, acute illness, hospitalization, pain medications. Hyponatremia seems stable and not very low as cause for his confusion. Treat the treatable.  Minimize pain/sedative medications. Sleep hygiene. -  Worsened confusion overnight 7/1-may be due to increasing steroids and ongoing intracranial issues. Steroids were decreased. Confusion improved or resolved.  Hyperglycemia/diabetes mellitus type 2 - Secondary to steroids. Currently on SSI. Reasonable inpatient control. -11/11/2014 hemoglobin A1c 6.6  Lower extremity edema - May be multifactorial secondary to hypoalbuminemia, fluid resuscitation early on during hospitalization and? Chronic diastolic CHF. As per family, PCP was adjusting diuretic dose as outpatient. - 2-D echo 12/30/15: LVEF 55-60 percent. Moderate focal basal hypertrophy of the septum and mild hypertrophy of the posterior wall. - Trial of IV Lasix. -8.2 L since admission. Continue diuresis. Improving. Discussed with RN regarding strict intake and output charting. Still has some volume overload -01/02/2016 venous duplex legs negative for DVT  Nausea and vomiting - ? Related to problem #1. Treat supportively.  -States that he has not vomited since 01/11/16.  Moderate malnutrition - Management per dietitian.   DVT prophylaxis: SCD's Code Status: Full Family Communication: Discussed with daughter at bedside Disposition Plan: to be determined >DC to SNF when medically ready, possibly towards the end of this week .  Total time 35 min; >50% spent face to face couseling and coordinating care   Consultants:   CCM- signed off  Palliative Team  TR[facility] are consultants  Procedures:   Stereotactic Radiosurgery for solitary brain metastasis on 01/05/16  Extubated  Antimicrobials:   Cefazolin 1 dose 6/27.  Levofloxacin 6/27 >. 7/1 Code Status:  FULL  DVT Prophylaxis:  SCDs   Procedures: As Listed in Progress Note Above  Antibiotics: None    Subjective:   Objective: Filed Vitals:   01/14/16 1021 01/14/16 1447 01/14/16 1450 01/14/16 1725  BP: 157/95  109/69 165/90  Pulse: 85  70 80  Temp: 98.6 F (37 C)   98.4 F (36.9 C) 98.6 F (37 C)  TempSrc: Oral  Oral Oral  Resp: '18  20 22  '$ Height:      Weight:      SpO2: 100% 100% 100% 99%    Intake/Output Summary (Last 24 hours) at 01/14/16 1806 Last data filed at 01/14/16 1531  Gross per 24 hour  Intake    480 ml  Output   4325 ml  Net  -3845 ml   Weight change:  Exam:   General:  Pt is alert, follows commands appropriately, not in acute distress  HEENT: No icterus, No thrush, No neck mass, [place]/AT  Cardiovascular: RRR, S1/S2, no rubs, no gallops  Respiratory: CTA bilaterally, no wheezing, no crackles, no rhonchi  Abdomen: Soft/+BS, non tender, non distended, no guarding  Extremities: No edema, No lymphangitis, No petechiae, No rashes, no synovitis   Data Reviewed: I have personally reviewed following labs and imaging studies Basic Metabolic Panel:  Recent Labs Lab 01/10/16 0854 01/11/16 1130 01/12/16 0527 01/13/16 0430 01/14/16 0500  NA 131* 132* 132* 133* 133*  K 5.1 4.3 4.4 4.2 4.1  CL 91* 92* 93* 92* 92*  CO2 30 36* 35* 36* 36*  GLUCOSE 126* 136* 113* 116* 114*  BUN 20 21* 22* 22* 21*  CREATININE 0.81 0.82 0.75 0.68 0.69  CALCIUM 8.1* 7.9* 8.3* 7.9* 8.0*   Liver Function Tests:  Recent Labs Lab 01/11/16 1130  AST 23  ALT 15*  ALKPHOS 35*  BILITOT 0.3  PROT 4.4*  ALBUMIN 2.4*   No results for input(s): LIPASE, AMYLASE in the last 168 hours. No results for input(s): AMMONIA in the last 168 hours. Coagulation Profile: No results for input(s): INR, PROTIME in the last 168 hours.  CBC:  Recent Labs Lab 01/09/16 0840 01/12/16 0527 01/13/16 0430 01/14/16 0500  WBC 6.8 6.2 5.8 5.6  HGB 10.4* 9.3* 8.6* 8.9*  HCT 32.2* 28.6* 26.7* 27.8*  MCV 94.2 93.2 95.0 93.9  PLT 128* 121* 120* 121*   Cardiac Enzymes: No results for input(s): CKTOTAL, CKMB, CKMBINDEX, TROPONINI in the last 168 hours. BNP: Invalid input(s): POCBNP CBG:  Recent Labs Lab 01/13/16 1651 01/13/16 2152 01/14/16 0620  01/14/16 1120 01/14/16 1723  GLUCAP 123* 120* 96 109* 116*   HbA1C: No results for input(s): HGBA1C in the last 72 hours. Urine analysis:    Component Value Date/Time   COLORURINE YELLOW 01/14/2016 1537   APPEARANCEUR CLEAR 01/14/2016 1537   LABSPEC 1.013 01/14/2016 1537   PHURINE 7.5 01/14/2016 1537   GLUCOSEU NEGATIVE 01/14/2016 1537   HGBUR NEGATIVE 01/14/2016 1537   BILIRUBINUR NEGATIVE 01/14/2016 1537   KETONESUR NEGATIVE 01/14/2016 1537   PROTEINUR NEGATIVE 01/14/2016 1537   UROBILINOGEN 1.0 11/11/2014 0900   NITRITE NEGATIVE 01/14/2016 1537   LEUKOCYTESUR NEGATIVE 01/14/2016 1537   Sepsis Labs: '@LABRCNTIP'$ (procalcitonin:4,lacticidven:4) ) Recent Results (from the past 240 hour(s))  Aerobic/Anaerobic Culture (surgical/deep wound)     Status: None   Collection Time: 01/06/16 10:10 AM  Result Value Ref Range Status   Specimen Description WOUND  Final   Special Requests RIGHT PARIETAL OCCIPITAL MASS  Final   Gram Stain   Final    MODERATE WBC PRESENT, PREDOMINANTLY MONONUCLEAR NO ORGANISMS SEEN Gram Stain Report Called to,Read Back By and Verified With: G CRAM,MD AT 1116 BY L BENFIELD    Culture No growth aerobically or anaerobically.  Final   Report Status 01/11/2016 FINAL  Final     Scheduled Meds: . amLODipine  5 mg Oral Daily  . budesonide (PULMICORT) nebulizer solution  0.25 mg Nebulization BID  . cloNIDine  0.1 mg Oral BID  . dexamethasone  2 mg Oral Q6H  . docusate sodium  100 mg Oral BID  . famotidine  40 mg Oral BID  . feeding supplement (ENSURE ENLIVE)  237 mL Oral BID BM  . furosemide  40 mg Intravenous BID  . guaiFENesin  400 mg Oral BID  . insulin aspart  0-9 Units Subcutaneous TID WC  . ipratropium-albuterol  3 mL Nebulization TID  . isosorbide mononitrate  30 mg Oral Daily  . levETIRAcetam  1,500 mg Oral BID  . metoprolol tartrate  50 mg Oral BID  . pantoprazole  40 mg Oral BID  . pramipexole  1 mg Oral QHS  . sodium chloride  2 g Oral TID WC   . tamsulosin  0.4 mg Oral Daily   Continuous Infusions:   Procedures/Studies: Dg Chest 2 View  12/22/2015  CLINICAL DATA:  Shortness of breath, wheezing, chest pressure radiating to the left arm. History of hypertension. History of lung cancer metastatic to brain post right upper lobectomy. EXAM: CHEST  2 VIEW COMPARISON:  12/13/2015 FINDINGS: Power port type central venous catheter with tip over the mid SVC region. No pneumothorax. Postoperative changes in the right lung with surgical clips and volume loss. No focal airspace disease or consolidation in the lungs. Blunting of the right costophrenic angle likely due to postoperative pleural thickening. Old right thoracotomy. Surgical clips in the base the neck. No pneumothorax. Mediastinal contours appear intact. Calcification of the aorta. IMPRESSION: Postoperative changes with associated volume loss in the right lung. No evidence of active pulmonary disease. Electronically Signed   By: Lucienne Capers M.D.   On:  12/22/2015 04:31   Ct Head Wo Contrast  01/10/2016  CLINICAL DATA:  75 year old male with metastatic lung cancer post craniotomy. Subsequent encounter. EXAM: CT HEAD WITHOUT CONTRAST TECHNIQUE: Contiguous axial images were obtained from the base of the skull through the vertex without intravenous contrast. COMPARISON:  01/09/2016 head CT.  12/13/2015 brain MR. FINDINGS: Post right parietal craniotomy. Gas and blood containing epidural collection below the craniotomy site with maximal thickness of 5.6 mm without significant change. Large right parietal lobe hematoma appears minimally more prominent than on yesterday's examination now measuring 3.7 x 4.3 x 4.2 cm versus prior 3.2 x 3.7 x 4 cm. Surrounding vasogenic edema and mass effect upon the right lateral ventricle which is displaced inferiorly. MR detected intracranial metastatic lesions are not as well delineated on the present unenhanced CT. IMPRESSION: Post right parietal craniotomy. Gas  and blood containing epidural collection below the craniotomy site with maximal thickness of 5.6 mm without significant change. Large right parietal lobe hematoma appears minimally more prominent than on yesterday's examination now measuring 3.7 x 4.3 x 4.2 cm versus prior 3.2 x 3.7 x 4 cm. Surrounding vasogenic edema and mass effect upon the right lateral ventricle which is displaced inferiorly. Electronically Signed   By: Genia Del M.D.   On: 01/10/2016 11:06   Ct Head Wo Contrast  01/09/2016  CLINICAL DATA:  Sudden onset of acute confusion and severe headache. Recent craniotomy for metastatic lung cancer. EXAM: CT HEAD WITHOUT CONTRAST TECHNIQUE: Contiguous axial images were obtained from the base of the skull through the vertex without intravenous contrast. COMPARISON:  Brain MRI 12/25/2015 FINDINGS: Sequelae of interval right parietal craniotomy are identified. There is a 4.5 x 2.8 x 4.0 cm hematoma in the right parietal lobe deep to the resection site. There is mild surrounding vasogenic edema with regional sulcal effacement but no midline shift. There is a small extra-axial collection/hematoma subjacent to the craniotomy which measures up to 7 mm in thickness. Elsewhere, there is no evidence of acute infarct or mass. The small metastases in the left frontal lobe and right cerebellum on MRI are not clearly seen on this noncontrast CT. There is mild global cerebral atrophy. Periventricular white matter hypodensities are nonspecific but compatible with mild chronic small vessel ischemic disease. Prior right cataract extraction is noted. Skin staples and swelling are noted in the right parietal scalp. Small amount of secretions are present in the right sphenoid sinus. There is a trace left mastoid effusion. Calcified atherosclerosis is noted at the skullbase. IMPRESSION: Interval postoperative changes with new 4.5 cm right parietal hematoma. Mild surrounding edema without midline shift. Critical  Value/emergent results were called by telephone at the time of interpretation on 01/09/2016 at 10:22 am to Dr. Kary Kos , who verbally acknowledged these results. Electronically Signed   By: Logan Bores M.D.   On: 01/09/2016 10:27   Ct Chest W Contrast  12/29/2015  CLINICAL DATA:  Metastatic squamous cell right upper lobe lung carcinoma presents for restaging on treatment. EXAM: CT CHEST, ABDOMEN, AND PELVIS WITH CONTRAST TECHNIQUE: Multidetector CT imaging of the chest, abdomen and pelvis was performed following the standard protocol during bolus administration of intravenous contrast. CONTRAST:  121m ISOVUE-300 IOPAMIDOL (ISOVUE-300) INJECTION 61% COMPARISON:  10/10/2015 CT chest, abdomen and pelvis. FINDINGS: CT CHEST Mediastinum/Nodes: Normal heart size. No significant pericardial fluid/thickening. Right internal jugular MediPort terminates in the lower third of the superior vena cava. Left main, left anterior descending, left circumflex and right coronary atherosclerosis. Atherosclerotic nonaneurysmal thoracic  aorta. Normal caliber pulmonary arteries. No central pulmonary emboli. No discrete thyroid nodules. Unremarkable esophagus. No pathologically enlarged axillary, mediastinal or hilar lymph nodes. Lungs/Pleura: No pneumothorax. Stable small simple layering right pleural effusion. New trace layering left pleural effusion. Layering secretions are present in the lower trachea and bilateral mainstem bronchi. Status post right upper lobectomy. Mild centrilobular emphysema and diffuse bronchial wall thickening. No acute consolidative airspace disease or significant pulmonary nodules. No appreciable change in patchy subpleural reticulation throughout both lungs. Musculoskeletal: No aggressive appearing focal osseous lesions. Stable incompletely healed nondisplaced posterior right seventh rib fracture. Stable healed deformity in the lateral right eighth rib. CT ABDOMEN AND PELVIS Hepatobiliary: Normal liver  with no liver mass. Normal gallbladder with no radiopaque cholelithiasis. No biliary ductal dilatation. Pancreas: Normal, with no mass or duct dilation. Spleen: Normal size. No mass. Adrenals/Urinary Tract: Normal adrenals. No hydronephrosis. No renal masses. Normal bladder. Stomach/Bowel: Grossly normal stomach. Normal caliber small bowel with no small bowel wall thickening. Stable moderate duodenal diverticulum in the superior third portion of the duodenum. Normal appendix. Normal large bowel with no diverticulosis, large bowel wall thickening or pericolonic fat stranding. Vascular/Lymphatic: Atherosclerotic abdominal aorta with 2.7 cm infrarenal abdominal aortic aneurysm, unchanged. Patent portal, splenic, hepatic and renal veins. No pathologically enlarged lymph nodes in the abdomen or pelvis. Reproductive: Top-normal size prostate. Other: No pneumoperitoneum.  Trace free fluid in the pelvis. Musculoskeletal: No aggressive appearing focal osseous lesions. Marked degenerative changes in the lumbar spine. Mild anasarca. IMPRESSION: 1. No evidence of metastatic disease in the chest, abdomen or pelvis . 2. No evidence of local tumor recurrence in the right lung status post right upper lobectomy . 3. Stable small right and new trace left pleural effusions. 4. Additional findings include left main and 3 vessel coronary atherosclerosis, mild centrilobular emphysema with diffuse bronchial wall thickening suggesting of COPD, and stable 2.7 cm infrarenal abdominal aortic aneurysm. Electronically Signed   By: Ilona Sorrel M.D.   On: 12/29/2015 08:25   Mr Jeri Cos OF Contrast  12/25/2015  CLINICAL DATA:  Metastatic lung cancer.  Chemo and radiosurgery. EXAM: MRI HEAD WITHOUT AND WITH CONTRAST TECHNIQUE: Multiplanar, multiecho pulse sequences of the brain and surrounding structures were obtained without and with intravenous contrast. CONTRAST:  75m MULTIHANCE GADOBENATE DIMEGLUMINE 529 MG/ML IV SOLN COMPARISON:  MRI  12/13/2015, 10/06/2015 FINDINGS: Image quality degraded by motion. The patient was not able to hold still. This amount of motion could obscure detection of small enhancing lesions. Right parietal lesion has improved in size now measuring 17 x 18 mm. Decreased surrounding edema. Left frontal lesion measures 7 mm with decreased surrounding edema. This lesion is slightly smaller. Left inferior frontal lobe lesion 5 mm and slightly smaller. 3 mm lesion right posterior cerebellum slightly improved. No new lesions identified. Ventricle size normal. Generalized atrophy. No shift of the midline structures Negative for hemorrhage Negative for acute infarct. Mild chronic microvascular ischemic change in the white matter and pons. Mild mucosal edema paranasal sinuses. IMPRESSION: Four metastatic deposits show interval improvement from the prior study with smaller enhancing lesions and decreased edema. No new lesions identified. Electronically Signed   By: CFranchot GalloM.D.   On: 12/25/2015 15:35   Ct Abdomen Pelvis W Contrast  12/29/2015  CLINICAL DATA:  Metastatic squamous cell right upper lobe lung carcinoma presents for restaging on treatment. EXAM: CT CHEST, ABDOMEN, AND PELVIS WITH CONTRAST TECHNIQUE: Multidetector CT imaging of the chest, abdomen and pelvis was performed following the standard protocol  during bolus administration of intravenous contrast. CONTRAST:  150m ISOVUE-300 IOPAMIDOL (ISOVUE-300) INJECTION 61% COMPARISON:  10/10/2015 CT chest, abdomen and pelvis. FINDINGS: CT CHEST Mediastinum/Nodes: Normal heart size. No significant pericardial fluid/thickening. Right internal jugular MediPort terminates in the lower third of the superior vena cava. Left main, left anterior descending, left circumflex and right coronary atherosclerosis. Atherosclerotic nonaneurysmal thoracic aorta. Normal caliber pulmonary arteries. No central pulmonary emboli. No discrete thyroid nodules. Unremarkable esophagus. No  pathologically enlarged axillary, mediastinal or hilar lymph nodes. Lungs/Pleura: No pneumothorax. Stable small simple layering right pleural effusion. New trace layering left pleural effusion. Layering secretions are present in the lower trachea and bilateral mainstem bronchi. Status post right upper lobectomy. Mild centrilobular emphysema and diffuse bronchial wall thickening. No acute consolidative airspace disease or significant pulmonary nodules. No appreciable change in patchy subpleural reticulation throughout both lungs. Musculoskeletal: No aggressive appearing focal osseous lesions. Stable incompletely healed nondisplaced posterior right seventh rib fracture. Stable healed deformity in the lateral right eighth rib. CT ABDOMEN AND PELVIS Hepatobiliary: Normal liver with no liver mass. Normal gallbladder with no radiopaque cholelithiasis. No biliary ductal dilatation. Pancreas: Normal, with no mass or duct dilation. Spleen: Normal size. No mass. Adrenals/Urinary Tract: Normal adrenals. No hydronephrosis. No renal masses. Normal bladder. Stomach/Bowel: Grossly normal stomach. Normal caliber small bowel with no small bowel wall thickening. Stable moderate duodenal diverticulum in the superior third portion of the duodenum. Normal appendix. Normal large bowel with no diverticulosis, large bowel wall thickening or pericolonic fat stranding. Vascular/Lymphatic: Atherosclerotic abdominal aorta with 2.7 cm infrarenal abdominal aortic aneurysm, unchanged. Patent portal, splenic, hepatic and renal veins. No pathologically enlarged lymph nodes in the abdomen or pelvis. Reproductive: Top-normal size prostate. Other: No pneumoperitoneum.  Trace free fluid in the pelvis. Musculoskeletal: No aggressive appearing focal osseous lesions. Marked degenerative changes in the lumbar spine. Mild anasarca. IMPRESSION: 1. No evidence of metastatic disease in the chest, abdomen or pelvis . 2. No evidence of local tumor recurrence in  the right lung status post right upper lobectomy . 3. Stable small right and new trace left pleural effusions. 4. Additional findings include left main and 3 vessel coronary atherosclerosis, mild centrilobular emphysema with diffuse bronchial wall thickening suggesting of COPD, and stable 2.7 cm infrarenal abdominal aortic aneurysm. Electronically Signed   By: JIlona SorrelM.D.   On: 12/29/2015 08:25   Dg Chest Portable 1 View  12/30/2015  CLINICAL DATA:  75year old male with shortness of breath and wheezing EXAM: PORTABLE CHEST 1 VIEW COMPARISON:  CT dated 12/29/2015 FINDINGS: Single portable view of the chest demonstrates emphysematous changes of the lungs. There is a small right pleural effusion as seen on the prior CT. There is associated partial compressive atelectasis of the right lung base. Underlying infiltrate is not excluded. No focal consolidation or pneumothorax. Right pectoral Port-A-Cath with tip over central SVC. 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 The cardiac silhouette is within normal limits with no acute osseous pathology. IMPRESSION: Small right pleural effusion. No focal consolidation or pneumothorax. Electronically Signed   By: AAnner CreteM.D.   On: 12/30/2015 02:12    Christie Viscomi, DO  [facility] Pager [phone] If 7PM-7AM, please contact night-coverage [email or web address] Password [email or web address] 01/14/2016, 6:06 PM   LOS: 8 days

## 2016-01-14 NOTE — Progress Notes (Signed)
RT changed duoneb to TID at this time per RT assessment protocol. Duoneb is prescribed QID at home, but patient only takes them 2-3 times a day. BBS rhonchi with strong productive cough. No wheezing noted. RT to monitor as needed

## 2016-01-14 NOTE — Progress Notes (Signed)
Occupational Therapy Treatment Patient Details Name: [name] MRN: [id number] DOB: 03/06/1941 Today's Date: 01/14/2016    History of present illness Patient is a 75 y/o male with recurrent lung ca with brain mets, COPD, restless leg syndrome, anxiety presents s/p craniotomy with resection of a right occipital mass. Head CT- showing worsening venous infarction with mass effect   OT comments  Pt required max assist +2 to perform stand pivot to [facility] this session. Total assist required for peri care. Pt continues to demonstrate decreased awareness and shows L inattention and L visual deficits. Updated d/c plan to SNF for further rehab. Will continue to follow acutely.   Follow Up Recommendations  SNF;Supervision/Assistance - 24 hour    Equipment Recommendations  Other (comment) (TBD at next venue)    Recommendations for Other Services      Precautions / Restrictions Precautions Precautions: Fall Precaution Comments: left visual field deficits; left inattention Restrictions Weight Bearing Restrictions: No       Mobility Bed Mobility Overal bed mobility: Needs Assistance Bed Mobility: Supine to Sit     Supine to sit: Max assist;HOB elevated     General bed mobility comments: pt unable to initiate L LE off EOB with report "I was able to move this morning", assist for trunk elevation  Transfers Overall transfer level: Needs assistance Equipment used: 2 person hand held assist Transfers: Sit to/from [facility] Sit to Stand: Max assist;+2 physical assistance Stand pivot transfers: Max assist;+2 physical assistance       General transfer comment: pt able to pull up with R UE on therapist but unable ot use L UE functionally. L knee blocked, max facilitation to advance R LE during std pvt to [facility]. Pt dependent to advance L LE    Balance Overall balance assessment: Needs assistance Sitting-balance support: Feet supported;Bilateral upper extremity  supported Sitting balance-Leahy Scale: Poor Sitting balance - Comments: pt with strong L lateral lean, can maintain midline x 5 sec. unable to use L UE to support self   Standing balance support: During functional activity Standing balance-Leahy Scale: Zero                     ADL Overall ADL's : Needs assistance/impaired                         Toilet Transfer: Maximal assistance;+2 for physical assistance;Stand-pivot;BSC Toilet Transfer Details (indicate cue type and reason): Stand pivot from EOB to [facility] Toileting- Clothing Manipulation and Hygiene: Total assistance;+2 for physical assistance;Sit to/from stand Toileting - Clothing Manipulation Details (indicate cue type and reason): +2 for sit to stand with third person to perform peri care     Functional mobility during ADLs: Maximal assistance;+2 for physical assistance (for stand pivot only) General ADL Comments: Pt continues to lack awarness to deficits (L vision, cognition, L inattention). Pt able to grip therapists hand and minimal elbow flexion noted in supine.      Vision                     Perception     Praxis      Cognition   Behavior During Therapy: [facility] for tasks assessed/performed Overall Cognitive Status: Impaired/Different from baseline Area of Impairment: Awareness;Problem solving;Safety/judgement Orientation Level: Disoriented to;Time   Memory: Decreased recall of precautions  Following Commands: Follows one step commands with increased time;Follows one step commands inconsistently Safety/Judgement: Decreased awareness of safety;Decreased awareness of deficits  Awareness: Intellectual Problem Solving: Slow processing;Decreased initiation;Difficulty sequencing;Requires verbal cues General Comments: Pt with no awareness to visual deficits and Lt inattention.     Extremity/Trunk Assessment               Exercises     Shoulder Instructions       General Comments       Pertinent Vitals/ Pain       Pain Assessment: No/denies pain  Home Living                                          Prior Functioning/Environment              Frequency Min 2X/week     Progress Toward Goals  OT Goals(current goals can now be found in the care plan section)  Progress towards OT goals: Progressing toward goals  Acute Rehab OT Goals Patient Stated Goal: none stated  Plan Discharge plan needs to be updated    Co-evaluation    PT/OT/SLP Co-Evaluation/Treatment: Yes Reason for Co-Treatment: Complexity of the patient's impairments (multi-system involvement);For patient/therapist safety   OT goals addressed during session: ADL's and self-care;Other (comment) (functional mobility)      End of Session Equipment Utilized During Treatment: Gait belt   Activity Tolerance Patient tolerated treatment well   Patient Left in chair;with call bell/phone within reach;with chair alarm set;with family/visitor present   Nurse Communication Mobility status;Need for lift equipment        Time: 1007-1020 OT Time Calculation (min): 13 min  Charges: OT General Charges $OT Visit: 1 Procedure OT Treatments $Self Care/Home Management : 8-22 mins  Binnie Kand M.S., OTR/L Pager: [phone]  01/14/2016, 11:49 AM

## 2016-01-14 NOTE — Progress Notes (Signed)
Physical Therapy Treatment Patient Details Name: [name] MRN: [id number] DOB: 13-Oct-1940 Today's Date: 01/14/2016    History of Present Illness Patient is a 75 y/o male with recurrent lung ca with brain mets, COPD, restless leg syndrome, anxiety presents s/p craniotomy with resection of a right occipital mass. Head CT- showing worsening venous infarction with mass effect    PT Comments    Pt con't to be unable to stand or transfer without maxAx2. Pt with con't regression of L sided strength. Pt reports "i could raise it this morning", wife and RN verified however now pt can only do quad set. Pt con't to have L sided weakness, L neglect, decreased insight to deficits. Pt with steady decline in function over the last 3 days.   Follow Up Recommendations  SNF     Equipment Recommendations       Recommendations for Other Services       Precautions / Restrictions Precautions Precautions: Fall Precaution Comments: left visual field deficits; left inattention Restrictions Weight Bearing Restrictions: No    Mobility  Bed Mobility Overal bed mobility: Needs Assistance Bed Mobility: Supine to Sit     Supine to sit: Max assist;HOB elevated     General bed mobility comments: pt unable to initiate L LE off EOB with report "I was able to move this morning", assist for trunk elevation  Transfers Overall transfer level: Needs assistance Equipment used: 2 person hand held assist (2 person lift with gait belt) Transfers: Sit to/from [employer] Transfers Sit to Stand: Max assist;+2 physical assistance Stand pivot transfers: Max assist;+2 physical assistance       General transfer comment: pt able to pull up with R UE on therapist but unable ot use L UE functionally. L knee blocked, max facilitation to advance R LE during std pvt to [facility]. Pt dependent to advance L LE  Ambulation/Gait             General Gait Details: unable   Stairs            Wheelchair  Mobility    Modified Rankin (Stroke Patients Only) Modified Rankin (Stroke Patients Only) Pre-Morbid Rankin Score: No significant disability Modified Rankin: Severe disability     Balance Overall balance assessment: Needs assistance Sitting-balance support: Feet supported Sitting balance-Leahy Scale: Poor Sitting balance - Comments: pt with strong L lateral lean, can maintain midline x 5 sec. unable to use L UE to support self     Standing balance-Leahy Scale: Zero                      Cognition Arousal/Alertness: Awake/alert Behavior During Therapy: WFL for tasks assessed/performed Overall Cognitive Status: Impaired/Different from baseline Area of Impairment: Awareness;Problem solving;Safety/judgement Orientation Level: Time   Memory: Decreased recall of precautions Following Commands: Follows one step commands with increased time;Follows one step commands inconsistently Safety/Judgement: Decreased awareness of safety;Decreased awareness of deficits Awareness: Intellectual Problem Solving: Slow processing;Decreased initiation;Difficulty sequencing;Requires verbal cues General Comments: Pt with no awareness to visual deficits and Lt inattention.     Exercises      General Comments        Pertinent Vitals/Pain Pain Assessment: No/denies pain    Home Living                      Prior Function            PT Goals (current goals can now be found in the care plan  section) Acute Rehab PT Goals Patient Stated Goal: none stated Progress towards PT goals: Not progressing toward goals - comment    Frequency  Min 4X/week    PT Plan Current plan remains appropriate    Co-evaluation             End of Session Equipment Utilized During Treatment: Gait belt Activity Tolerance: Patient limited by fatigue Patient left:  (on [facility] with wife supporting pt's left side)     Time: 0911-0920 PT Time Calculation (min) (ACUTE ONLY): 9 min  Charges:   $Therapeutic Activity: 8-22 mins                    G Codes:      [name] 01/14/2016, 11:01 AM  [name], PT, DPT Pager #: [phone] Office #: [phone]

## 2016-01-14 NOTE — Progress Notes (Signed)
Daily Progress Note   Patient Name: [name]       Date: 01/14/2016 DOB: 1941/04/22  Age: 75 y.o. MRN#: [id number] Attending Physician: Kary Kos, MD Primary Care Physician: Beatris Si Admit Date: 01/06/2016  Reason for Consultation/Follow-up: Establishing goals of care  Subjective: I met today with [name] along with his wife at bedside. I noticed that [name] has had many questions about his prognosis. I discussed with them their concerns. [name] is very frustrated with his current status and lack of progress in movement and strength. He doesn't understand why we are not doing more to fix this he says. I explain that we are doing all we can but his body is going to have to function better and he has medical issues and limitations that may make this difficult. He seems to somewhat understand.   I asked him about his fears and he tells me that he is afraid of dying. He also says that he does not want to die and is not ready. We discuss the terminal nature of his cancer which he seems to have some denial regarding this. I also use this opportunity to discuss code status and his wishes - he never clearly answers me but becomes tearful and redirects the conversation to focusing on rehab and tells me that he does not feel like he is anywhere close to dying. I reassure him that we are all focused on helping him to improve to the best QOL and to keep him alive as long as we can.   I spoke privately with [name] who tells me that she fears that time may be limited. I explain that there is nothing immediately distressing except for his weakness that tells me he is at end of life. She very much wants to be educated and helped to be aware of these signs when they come. As far as  prognosis I am not sure but I do know that it is very poor. [name] is struggling as she feels very much pulled by her husband to do everything for him past her abilities. She feels like he has always been very self involved and only worries about her being her to help him with no thoughts to how difficult this is for her. I provided support to her and  encouraged her to take care of herself and set boundaries to protect her own health. She is more understanding of [name] prognosis but says he has always been very fearful of dying. She is having difficulty as she hears his wishes and supports his decisions but also understands that when his time comes it does not always make sense to put people through the interventions like resuscitation at end of life. She fears that this will be left on her shoulders. Hard Choices given. Emotional support provided.   Length of Stay: 8  Current Medications: Scheduled Meds:  . amLODipine  5 mg Oral Daily  . budesonide (PULMICORT) nebulizer solution  0.25 mg Nebulization BID  . cloNIDine  0.1 mg Oral BID  . dexamethasone  2 mg Oral Q6H  . famotidine  40 mg Oral BID  . feeding supplement (ENSURE ENLIVE)  237 mL Oral BID BM  . furosemide  40 mg Intravenous BID  . guaiFENesin  400 mg Oral BID  . insulin aspart  0-9 Units Subcutaneous TID WC  . ipratropium-albuterol  3 mL Nebulization TID  . isosorbide mononitrate  30 mg Oral Daily  . levETIRAcetam  1,500 mg Oral BID  . metoprolol tartrate  50 mg Oral BID  . pantoprazole  40 mg Oral BID  . pramipexole  1 mg Oral QHS  . sodium chloride  2 g Oral TID WC    Continuous Infusions:    PRN Meds: acetaminophen, clonazePAM, HYDROcodone-acetaminophen, ipratropium-albuterol, labetalol, ondansetron **OR** ondansetron (ZOFRAN) IV, promethazine, sodium chloride, sodium chloride flush  Physical Exam  Constitutional: He appears well-developed.  HENT:  Head: Normocephalic.  Surgical incision with staples  posterior head. No redness or drainage.  Cardiovascular: Normal rate.   Pulmonary/Chest: No accessory muscle usage. No tachypnea. No respiratory distress.  Abdominal: Soft. Normal appearance.  Musculoskeletal:  LUE/LLE extremely weak with very little sensation.   Neurological: He is alert.  Mostly oriented but moments of confusion.             Vital Signs: BP 157/95 mmHg  Pulse 85  Temp(Src) 98.6 F (37 C) (Oral)  Resp 18  Ht 5' 9" (1.753 m)  Wt 83.371 kg (183 lb 12.8 oz)  BMI 27.13 kg/m2  SpO2 100% SpO2: SpO2: 100 % O2 Device: O2 Device: Nasal Cannula O2 Flow Rate: O2 Flow Rate (L/min): 2 L/min  Intake/output summary:  Intake/Output Summary (Last 24 hours) at 01/14/16 1347 Last data filed at 01/14/16 1740  Gross per 24 hour  Intake    480 ml  Output   2225 ml  Net  -1745 ml   LBM: Last BM Date: 01/08/16 Baseline Weight: Weight: 85.1 kg (187 lb 9.8 oz) Most recent weight: Weight: 83.371 kg (183 lb 12.8 oz)       Palliative Assessment/Data:    Flowsheet Rows        Most Recent Value   Intake Tab    Referral Department  Hospitalist   Unit at Time of Referral  Other (Comment)   Palliative Care Primary Diagnosis  Neurology   Date Notified  01/07/16   Palliative Care Type  New Palliative care   Reason for referral  Clarify Goals of Care   Date of Admission  01/06/16   Date first seen by Palliative Care  01/09/16   # of days Palliative referral response time  2 Day(s)   # of days IP prior to Palliative referral  1   Clinical Assessment    Palliative Performance Scale  Score  40%   Pain Max last 24 hours  Not able to report   Pain Min Last 24 hours  Not able to report   Dyspnea Max Last 24 Hours  Not able to report   Dyspnea Min Last 24 hours  Not able to report   Nausea Max Last 24 Hours  Not able to report   Nausea Min Last 24 Hours  Not able to report   Anxiety Max Last 24 Hours  Not able to report   Anxiety Min Last 24 Hours  Not able to report   Other Max  Last 24 Hours  Not able to report   Psychosocial & Spiritual Assessment    Palliative Care Outcomes    Patient/Family meeting held?  Yes   Who was at the meeting?  pt and wife   Palliative Care Outcomes  Clarified goals of care   Palliative Care follow-up planned  Yes, Facility      Patient Active Problem List   Diagnosis Date Noted  . Malnutrition of moderate degree 01/12/2016  . Palliative care encounter   . Acute confusion   . S/P craniotomy 01/06/2016  . Acute on chronic respiratory failure ([place]) 01/06/2016  . COPD exacerbation ([place]) 12/30/2015  . Acute exacerbation of COPD with asthma ([place]) 12/30/2015  . Acute exacerbation of CHF (congestive heart failure) ([place]) 12/30/2015  . Leukocytosis 12/30/2015  . COPD mixed type ([place]) 12/25/2015  . Edema 12/25/2015  . New onset seizure ([place])   . Malignant neoplasm of lung ([place])   . Benign essential HTN   . Acute left hemiparesis ([place])   . Left hemiparesis ([place]) 12/13/2015  . Respiratory failure ([place]) 12/13/2015  . Brain metastases ([place])   . Volume overload 09/26/2015  . Hand foot syndrome 09/26/2015  . Malignant neoplasm of hilus of lung ([place]) 09/25/2015  . Unintentional weight loss 09/10/2015  . Hypertension 09/10/2015  . Encounter for antineoplastic chemotherapy 07/20/2015  . 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 Metastatic lung cancer (metastasis from lung to other site) [facility])   . Poor venous access   . Epistaxis 05/30/2015  . Extravasation, infusion or chemotherapeutic agent 05/09/2015  . Hypokalemia 04/02/2015  . Constipation 01/29/2015  . Insomnia 01/29/2015  . Cancer associated pain 12/30/2014  . Rash 12/30/2014  . Syncope 12/30/2014  . Encounter for antineoplastic immunotherapy 12/16/2014  . UTI (lower urinary tract infection) 11/11/2014  . Sepsis ([place]) 11/11/2014  . Left arm cellulitis   . Blood poisoning ([place])   . Dehydration 10/12/2014  . Renal stone 10/05/2014  . Pyelonephritis  10/05/2014  . Chemotherapy induced neutropenia ([place]) 09/18/2014  . Dyspnea 09/11/2014  . Brain metastasis ([place]) 09/11/2014  . Tachycardia 06/21/2014  . Carotid stenosis 05/01/2014  . Aftercare following surgery of the circulatory system 05/01/2014  . Weakness-Hand and  Legs 05/01/2014  . Coronary atherosclerosis of native coronary artery 03/07/2014  . Essential hypertension, benign 03/07/2014  . Tobacco use disorder 03/07/2014  . Neoplasm of upper lobe of right lung ([place]) 10/26/2012  . Peripheral vascular disease, unspecified ([place])   . COPD (chronic obstructive pulmonary disease) ([place]) 11/16/2011  . Occlusion and stenosis of carotid artery without mention of cerebral infarction 05/12/2011    Palliative Care Assessment & Plan   Patient Profile: 75 y.o. male with past medical history of Arthritis, carotid artery occlusion, severe COPD, restless leg syndrome, lumbar disc disease, hypertension, GERD, peripheral vascular disease, cancer (initially diagnosed in 2014), now with return of  disease to brain admitted on 01/06/2016 with left-sided weakness and tonic-clonic seizures that required intubation. He underwent craniotomy on 6/27.Marland Kitchen He is alert and oriented to himself and generally his situation. He is confused, irritable.  Assessment: Sitting in chair. Becoming uncomfortable in chair.   Recommendations/Plan:  Pain: Complaining only of a mild headache. Continue with Tylenol when necessary.   Dyspnea: Continue with targeted pulmonary treatments, O2 as needed, nebulizer treatments. If patient did require opioids secondary to worsening dyspnea, would recommend low-dose oral morphine concentrate 5 mg every 4 when necessary.    Goals of Care and Additional Recommendations:  Limitations on Scope of Treatment: Full Scope Treatment  Code Status:    Code Status Orders        Start     Ordered   01/06/16 1344  Full code   Continuous     01/06/16 1343    Code Status History    Date  Active Date Inactive Code Status Order ID Comments User Context   12/30/2015  5:44 AM 12/30/2015  9:03 PM Full Code [id number]  Norval Morton, MD Inpatient   12/30/2015  5:36 AM 12/30/2015  5:44 AM Full Code [id number]  Norval Morton, MD ED   12/13/2015  4:49 PM 12/16/2015  5:49 PM Full Code [id number]  Grace Bushy Minor, NP Inpatient   11/11/2014  1:45 PM 11/14/2014  2:26 PM Full Code [id number]  Verlee Monte, MD Inpatient   09/14/2012 12:34 PM 09/20/2012  2:03 PM Full Code [id number]  John Giovanni, PA-C Inpatient       Prognosis:  Very poor with metastatic lung cancer. I did explain that prognosis likely months.   Discharge Planning:  [place] for rehab with Palliative care service follow-up   Thank you for allowing the Palliative Medicine Team to assist in the care of this patient.   Time In: 1230 Time Out: 1330 Total Time 69mn Prolonged Time Billed  no       Greater than 50%  of this time was spent counseling and coordinating care related to the above assessment and plan.  PPershing Proud NP  Please contact Palliative Medicine Team phone at [phone] questions and concerns.

## 2016-01-15 ENCOUNTER — Ambulatory Visit: Payer: Medicare Other | Admitting: Internal Medicine

## 2016-01-15 ENCOUNTER — Inpatient Hospital Stay (HOSPITAL_COMMUNITY): Payer: Medicare Other

## 2016-01-15 DIAGNOSIS — D696 Thrombocytopenia, unspecified: Secondary | ICD-10-CM

## 2016-01-15 DIAGNOSIS — E871 Hypo-osmolality and hyponatremia: Secondary | ICD-10-CM

## 2016-01-15 DIAGNOSIS — R531 Weakness: Secondary | ICD-10-CM

## 2016-01-15 LAB — URINE CULTURE: CULTURE: NO GROWTH

## 2016-01-15 LAB — GLUCOSE, CAPILLARY
GLUCOSE-CAPILLARY: 146 mg/dL — AB (ref 65–99)
GLUCOSE-CAPILLARY: 126 mg/dL — AB (ref 65–99)
Glucose-Capillary: 153 mg/dL — ABNORMAL HIGH (ref 65–99)

## 2016-01-15 LAB — VITAMIN B12: Vitamin B-12: 222 pg/mL (ref 180–914)

## 2016-01-15 MED ORDER — DEXAMETHASONE SODIUM PHOSPHATE 10 MG/ML IJ SOLN
INTRAMUSCULAR | Status: AC
  Filled 2016-01-15: qty 1

## 2016-01-15 MED ORDER — SORBITOL 70 % SOLN
30.0000 mL | Freq: Once | Status: AC
  Administered 2016-01-15: 30 mL via ORAL
  Filled 2016-01-15 (×2): qty 30

## 2016-01-15 MED ORDER — LABETALOL HCL 5 MG/ML IV SOLN: 10.0000 mg | INTRAVENOUS | Status: DC | PRN

## 2016-01-15 MED ORDER — DEXAMETHASONE 4 MG PO TABS
4.0000 mg | ORAL_TABLET | Freq: Four times a day (QID) | ORAL | Status: DC
  Administered 2016-01-15 – 2016-01-16 (×4): 4 mg via ORAL
  Filled 2016-01-15 (×4): qty 1

## 2016-01-15 MED ORDER — FLEET ENEMA 7-19 GM/118ML RE ENEM
1.0000 | ENEMA | Freq: Once | RECTAL | Status: AC
  Administered 2016-01-15: 1 via RECTAL
  Filled 2016-01-15: qty 1

## 2016-01-15 MED ORDER — VITAMIN B-12 100 MCG PO TABS
500.0000 ug | ORAL_TABLET | Freq: Every day | ORAL | Status: DC
  Administered 2016-01-15 – 2016-01-16 (×2): 500 ug via ORAL
  Filled 2016-01-15 (×2): qty 5

## 2016-01-15 NOTE — Progress Notes (Signed)
Daily Progress Note   Patient Name: DONYALE BERTHOLD       Date: 01/15/2016 DOB: Jun 05, 1941  Age: 75 y.o. MRN#: [id number] Attending Physician: Kary Kos, MD Primary Care Physician: Beatris Si Admit Date: 01/06/2016  Reason for Consultation/Follow-up: Establishing goals of care  Subjective: I met again today with Mr. Bither and Mrs. Prosch at bedside today. They are very upset today and feel Mr. Messmer feels like people are giving up on him. They were frustrated with conversations. I tried to explain that there is no one that is giving up on him but we all want him to thrive as well and as long as possible. I explained quality of life measures and the fact that we do know he has a terminal disease and we also want him to have the best quality of life as well. Emotional support provided.   He is also frustrated with his progress with therapy and is looking for equipment to go in the end of the bed to push his feet against - PT/OT to assist with this please.   Length of Stay: 9  Current Medications: Scheduled Meds:  . amLODipine  5 mg Oral Daily  . budesonide (PULMICORT) nebulizer solution  0.25 mg Nebulization BID  . cloNIDine  0.1 mg Oral BID  . dexamethasone      . dexamethasone  4 mg Oral Q6H  . docusate sodium  100 mg Oral BID  . famotidine  40 mg Oral BID  . feeding supplement (ENSURE ENLIVE)  237 mL Oral BID BM  . furosemide  40 mg Intravenous BID  . guaiFENesin  400 mg Oral BID  . insulin aspart  0-9 Units Subcutaneous TID WC  . ipratropium-albuterol  3 mL Nebulization TID  . isosorbide mononitrate  30 mg Oral Daily  . levETIRAcetam  1,500 mg Oral BID  . metoprolol tartrate  50 mg Oral BID  . pantoprazole  40 mg Oral BID  . pramipexole  1 mg Oral QHS  . sodium  chloride  2 g Oral TID WC  . sodium phosphate  1 enema Rectal Once  . tamsulosin  0.4 mg Oral Daily    Continuous Infusions:    PRN Meds: acetaminophen, clonazePAM, HYDROcodone-acetaminophen, ipratropium-albuterol, labetalol, ondansetron **OR** ondansetron (ZOFRAN) IV, promethazine, sodium chloride, sodium chloride flush  Physical Exam  Constitutional: He appears well-developed.  HENT:  Head: Normocephalic.  Surgical incision with staples posterior head. No redness or drainage.  Cardiovascular: Normal rate.   Pulmonary/Chest: No accessory muscle usage. No tachypnea. No respiratory distress.  Abdominal: Soft. Normal appearance.  Musculoskeletal:  LUE/LLE extremely weak with very little sensation.   Neurological: He is alert.  Mostly oriented but moments of confusion.             Vital Signs: BP 102/67 mmHg  Pulse 83  Temp(Src) 98.9 F (37.2 C) (Oral)  Resp 18  Ht '5\' 9"'  (1.753 m)  Wt 82.146 kg (181 lb 1.6 oz)  BMI 26.73 kg/m2  SpO2 100% SpO2: SpO2: 100 % O2 Device: O2 Device: Nasal Cannula O2 Flow Rate: O2 Flow Rate (L/min): 2 L/min  Intake/output summary:   Intake/Output Summary (Last 24 hours) at 01/15/16 1003 Last data filed at 01/15/16 0830  Gross per 24 hour  Intake    120 ml  Output   4550 ml  Net  -4430 ml   LBM: Last BM Date: 01/14/16 Baseline Weight: Weight: 85.1 kg (187 lb 9.8 oz) Most recent weight: Weight: 82.146 kg (181 lb 1.6 oz)       Palliative Assessment/Data:    Flowsheet Rows        Most Recent Value   Intake Tab    Referral Department  Hospitalist   Unit at Time of Referral  Other (Comment)   Palliative Care Primary Diagnosis  Neurology   Date Notified  01/07/16   Palliative Care Type  New Palliative care   Reason for referral  Clarify Goals of Care   Date of Admission  01/06/16   Date first seen by Palliative Care  01/09/16   # of days Palliative referral response time  2 Day(s)   # of days IP prior to Palliative referral  1    Clinical Assessment    Palliative Performance Scale Score  40%   Pain Max last 24 hours  Not able to report   Pain Min Last 24 hours  Not able to report   Dyspnea Max Last 24 Hours  Not able to report   Dyspnea Min Last 24 hours  Not able to report   Nausea Max Last 24 Hours  Not able to report   Nausea Min Last 24 Hours  Not able to report   Anxiety Max Last 24 Hours  Not able to report   Anxiety Min Last 24 Hours  Not able to report   Other Max Last 24 Hours  Not able to report   Psychosocial & Spiritual Assessment    Palliative Care Outcomes    Patient/Family meeting held?  Yes   Who was at the meeting?  pt and wife   Palliative Care Outcomes  Clarified goals of care   Palliative Care follow-up planned  Yes, Facility      Patient Active Problem List   Diagnosis Date Noted  . Chronic respiratory failure with hypoxia ([place]) 01/14/2016  . DNR (do not resuscitate) discussion   . Malnutrition of moderate degree 01/12/2016  . Palliative care encounter   . Acute confusion   . S/P craniotomy 01/06/2016  . Acute on chronic respiratory failure ([place]) 01/06/2016  . COPD exacerbation ([place]) 12/30/2015  . Acute exacerbation of COPD with asthma ([place]) 12/30/2015  . Acute exacerbation of CHF (congestive heart failure) ([place]) 12/30/2015  . Leukocytosis 12/30/2015  . COPD mixed type ([place]) 12/25/2015  . Edema 12/25/2015  . New  onset seizure ([place])   . Malignant neoplasm of lung ([place])   . Benign essential HTN   . Acute left hemiparesis ([place])   . Left hemiparesis ([place]) 12/13/2015  . Respiratory failure ([place]) 12/13/2015  . Brain metastases ([place])   . Volume overload 09/26/2015  . Hand foot syndrome 09/26/2015  . Malignant neoplasm of hilus of lung ([place]) 09/25/2015  . Unintentional weight loss 09/10/2015  . Hypertension 09/10/2015  . Encounter for antineoplastic chemotherapy 07/20/2015  . 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 Metastatic lung cancer (metastasis from lung to  other site) [facility])   . Poor venous access   . Epistaxis 05/30/2015  . Extravasation, infusion or chemotherapeutic agent 05/09/2015  . Hypokalemia 04/02/2015  . Constipation 01/29/2015  . Insomnia 01/29/2015  . Cancer associated pain 12/30/2014  . Rash 12/30/2014  . Syncope 12/30/2014  . Encounter for antineoplastic immunotherapy 12/16/2014  . UTI (lower urinary tract infection) 11/11/2014  . Sepsis ([place]) 11/11/2014  . Left arm cellulitis   . Blood poisoning ([place])   . Dehydration 10/12/2014  . Renal stone 10/05/2014  . Pyelonephritis 10/05/2014  . Chemotherapy induced neutropenia ([place]) 09/18/2014  . Dyspnea 09/11/2014  . Brain metastasis ([place]) 09/11/2014  . Tachycardia 06/21/2014  . Carotid stenosis 05/01/2014  . Aftercare following surgery of the circulatory system 05/01/2014  . Weakness-Hand and  Legs 05/01/2014  . Coronary atherosclerosis of native coronary artery 03/07/2014  . Essential hypertension, benign 03/07/2014  . Tobacco use disorder 03/07/2014  . Neoplasm of upper lobe of right lung ([place]) 10/26/2012  . Peripheral vascular disease, unspecified ([place])   . COPD (chronic obstructive pulmonary disease) ([place]) 11/16/2011  . Occlusion and stenosis of carotid artery without mention of cerebral infarction 05/12/2011    Palliative Care Assessment & Plan   Patient Profile: 75 y.o. male with past medical history of Arthritis, carotid artery occlusion, severe COPD, restless leg syndrome, lumbar disc disease, hypertension, GERD, peripheral vascular disease, cancer (initially diagnosed in 2014), now with return of disease to brain admitted on 01/06/2016 with left-sided weakness and tonic-clonic seizures that required intubation. He underwent craniotomy on 6/27.Marland Kitchen He is alert and oriented to himself and generally his situation. He is confused, irritable.  Assessment: Sitting in chair. Becoming uncomfortable in chair.   Recommendations/Plan:  Pain: Complaining only of a mild  headache. Continue with Tylenol when necessary.   Dyspnea: Continue with targeted pulmonary treatments, O2 as needed, nebulizer treatments. If patient did require opioids secondary to worsening dyspnea, would recommend low-dose oral morphine concentrate 5 mg every 4 when necessary.    Constipation: Sorbitol x 1.   Goals of Care and Additional Recommendations:  Limitations on Scope of Treatment: Full Scope Treatment  Code Status:    Code Status Orders        Start     Ordered   01/06/16 1344  Full code   Continuous     01/06/16 1343    Code Status History    Date Active Date Inactive Code Status Order ID Comments User Context   12/30/2015  5:44 AM 12/30/2015  9:03 PM Full Code [id number]  Norval Morton, MD Inpatient   12/30/2015  5:36 AM 12/30/2015  5:44 AM Full Code [id number]  Norval Morton, MD ED   12/13/2015  4:49 PM 12/16/2015  5:49 PM Full Code [id number]  Grace Bushy Minor, NP Inpatient   11/11/2014  1:45 PM 11/14/2014  2:26 PM Full Code [id number]  Verlee Monte, MD Inpatient  09/14/2012 12:34 PM 09/20/2012  2:03 PM Full Code [id number]  John Giovanni, PA-C Inpatient       Prognosis:  Very poor with metastatic lung cancer. I did explain that prognosis likely months.   Discharge Planning:  [facility] for rehab with Palliative care service follow-up   Thank you for allowing the Palliative Medicine Team to assist in the care of this patient.   Time In: 0915 Time Out: 1000 Total Time 103mn Prolonged Time Billed  no       Greater than 50%  of this time was spent counseling and coordinating care related to the above assessment and plan.  PPershing Proud NP  Please contact Palliative Medicine Team phone at [phone] questions and concerns.

## 2016-01-15 NOTE — Clinical Social Work Placement (Signed)
   CLINICAL SOCIAL WORK PLACEMENT  NOTE  Date:  01/15/2016  Patient Details  Name: [name] MRN: [id number] Date of Birth: [birth year]  Clinical Social Work is seeking post-discharge placement for this patient at the [place] level of care (*CSW will initial, date and re-position this form in  chart as items are completed):  Yes   Patient/family provided with [facility] Work Department's list of facilities offering this level of care within the geographic area requested by the patient (or if unable, by the patient's family).  Yes   Patient/family informed of their freedom to choose among providers that offer the needed level of care, that participate in Medicare, Medicaid or managed care program needed by the patient, have an available bed and are willing to accept the patient.  Yes   Patient/family informed of [facility]'s ownership interest in [facility] and [facility], as well as of the fact that they are under no obligation to receive care at these facilities.  PASRR submitted to EDS on 01/09/16     PASRR number received on 01/09/16     Existing PASRR number confirmed on       FL2 transmitted to all facilities in geographic area requested by pt/family on 01/09/16     FL2 transmitted to all facilities within larger geographic area on       Patient informed that his/her managed care company has contracts with or will negotiate with certain facilities, including the following:        Yes   Patient/family informed of bed offers received.  Patient chooses bed at [name]     Physician recommends and patient chooses bed at      Patient to be transferred to   on  .  Patient to be transferred to facility by       Patient family notified on   of transfer.  Name of family member notified:        PHYSICIAN Please sign FL2     Additional Comment:    _______________________________________________ Ross Ludwig,  LCSWA 01/15/2016, 7:05 PM

## 2016-01-15 NOTE — Consult Note (Signed)
WOC wound consult note Reason for Consult: skin tear left forearm Per family sustained during a fall.  Patient is noted to have frail, thin skin with ecchymosis over both UE. Left sided weakness noted Wound type: skin tear Measurement:2.0cm x 1.5cm x 0.1cm  Wound bed: moist, skin flap missing, no s/s of infection Drainage (amount, consistency, odor) minimal, serosanguinous  Periwound: ecchymosis, edema Dressing procedure/placement/frequency: Vaseline gauze, non adherent dressing to skin tear. Cover with dry dressing, secure with kerlix or conform gauze. Change every 3 days.   Discussed POC with patient and bedside nurse.  Re consult if needed, will not follow at this time. Thanks  C[employer], RN,CNS, [place] [phone])

## 2016-01-15 NOTE — Progress Notes (Signed)
Physical Therapy Treatment Patient Details Name: [name] MRN: [id number] DOB: 04-07-1941 Today's Date: 01/15/2016    History of Present Illness Patient is a 75 y/o male with recurrent lung ca with brain mets, COPD, restless leg syndrome, anxiety presents s/p craniotomy with resection of a right occipital mass. Head CT- showing worsening venous infarction with mass effect    PT Comments    Patient continues to demo L side inattention and L visual field deficits. Mod A for bed mobility and max A +2 for OOB transfers. Continue to progress as tolerated with anticipated d/c to SNF for further skilled PT services.    Follow Up Recommendations  SNF     Equipment Recommendations  None recommended by PT    Recommendations for Other Services       Precautions / Restrictions Precautions Precautions: Fall Precaution Comments: left visual field deficits; left inattention Restrictions Weight Bearing Restrictions: No    Mobility  Bed Mobility Overal bed mobility: Needs Assistance Bed Mobility: Supine to Sit     Supine to sit: HOB elevated;Mod assist     General bed mobility comments: pt able to advance L LE to EOB with cues needed for initiation and completion of task and assist to elevate trunk into sitting with use of rail and to support L UE  Transfers Overall transfer level: Needs assistance Equipment used:  (2 person lift with gait belt) Transfers: Stand Pivot Transfers   Stand pivot transfers: Max assist;+2 physical assistance       General transfer comment: assist to power up into standing and pivot feet with L knee blocked by therapist; pt attempted to pivot R foot and assisted with R UE; guidance needed at hips for descent to recliner  Ambulation/Gait             General Gait Details: unable   Stairs            Wheelchair Mobility    Modified Rankin (Stroke Patients Only) Modified Rankin (Stroke Patients Only) Pre-Morbid Rankin Score: No  significant disability Modified Rankin: Severe disability     Balance Overall balance assessment: Needs assistance Sitting-balance support: Feet supported;Single extremity supported (R UE support) Sitting balance-Leahy Scale: Poor Sitting balance - Comments: heavy L lateral lean; pt unable to use L UE to maintain midline sitting despite multimodal cues and approximation     Standing balance-Leahy Scale: Zero                      Cognition Arousal/Alertness: Awake/alert Behavior During Therapy: WFL for tasks assessed/performed Overall Cognitive Status: Impaired/Different from baseline Area of Impairment: Awareness;Problem solving;Safety/judgement Orientation Level: Time   Memory: Decreased recall of precautions Following Commands: Follows one step commands with increased time;Follows one step commands inconsistently Safety/Judgement: Decreased awareness of safety;Decreased awareness of deficits Awareness: Intellectual Problem Solving: Slow processing;Decreased initiation;Difficulty sequencing;Requires verbal cues General Comments: Pt with no awareness to visual deficits and Lt inattention.     Exercises      General Comments General comments (skin integrity, edema, etc.): edema in bilat feet; skin tear on L UE with bandage; pt's skin is easily torn      Pertinent Vitals/Pain Pain Assessment: No/denies pain    Home Living                      Prior Function            PT Goals (current goals can now be found in the care  plan section) Acute Rehab PT Goals Patient Stated Goal: none stated Progress towards PT goals: Progressing toward goals    Frequency  Min 4X/week    PT Plan Current plan remains appropriate    Co-evaluation             End of Session Equipment Utilized During Treatment: Gait belt Activity Tolerance: Patient tolerated treatment well Patient left: in chair;with call bell/phone within reach;with chair alarm set;with  family/visitor present     Time: 6734-1937 PT Time Calculation (min) (ACUTE ONLY): 23 min  Charges:  $Therapeutic Activity: 23-37 mins                    G Codes:      Salina April, PTA Pager: [phone]   01/15/2016, 4:10 PM

## 2016-01-15 NOTE — Progress Notes (Signed)
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 Post surgery, admitted to ICU and CCM provided medical consultation. Transferred to floor on 6/29 &TRH assumed consultation services. Worsening mental status 7/1 and CT showing worsening venous infarction with mass effect. Mental status improved after reducing steroids. Diuresing for lower extremity edema. Plans for SNF and of this week.  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 - Currently on dexamethasone and on Keppra for seizures.  - Intra-Op cultures negative. - Intra-Op pathology of brain tumor: Metastatic squamous cell carcinoma consistent with lung primary - Repeat head CT 6/30: New 4.5 cm right parietal hematoma. Mild surrounding edema without midline shift. - CT heads 7/1: Gas and blood containing epidural collection below the craniotomy site with maximal thickness of 5.6 mm without significant change. Enlarging right parietal lobe hematoma with surrounding vasogenic edema and mass effect upon  right lateral ventricle. - Neurosurgery increased Decadron and added IV Lasix.  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 No reported seizures.  RLS - Continue Mirapex.  Oxygen dependent and GOLD 3-4 COPD/chronic hypoxic respiratory failure - On home oxygen between 2-3 L/m continuously. Former tobacco abuse history. - Continue pulmonary toilet. - 01/15/16-->CXR as pt c/o dyspnea  Stage IV squamous cell carcinoma of lung with metastasis to brain, and adrenals and lymphangitic spread - See's [name] and Dr. Lisbeth Renshaw - Palliative care consulted by CCM prior to moving out of ICU. As per palliative care consultation, continued aggressive care and full code. -01/15/16--case discussed with palliative medicine-->pt wants full scope of treatment, upset when given any type of bad or negative information  Essential hypertension - controlled. Currently on amlodipine, clonidine, Imdur, metoprolol.  -Lisinopril discontinued this admission.  PVD/CAD - Discussed with neurosurgery and discontinued aspirin on 7/1-resume when okay with neurosurgery.  Hyponatremia - ? SIADH. Periodically follow BMP.  -Agree with salt tablets. Also on IV Lasix since 01/10/16 - Stable and improving.  Constipation -add colace and bisacodyl-->small BM -01/14/16 AXR--> nonobstructive, increased O [place] with air-filled bowel loops. -fleets enema  Anemia of chronic disease - Hemoglobin gradually drifting down, 10.4 > 9.3 > 8.6.  -Follow CBC and transfuse if hemoglobin less than 7 g per DL. -Baseline hemoglobin 9-10 -am CBC  Thrombocytopenia - Stable -check B12--222-->replete  -RBC folate--pending  Confusion - May be multifactorial related to brain surgery, brain  metastases, new worsening brain hematoma, acute illness, hospitalization, pain medications. Hyponatremia seems stable and  not very low as cause for his confusion. Treat the treatable. Minimize pain/sedative medications. Sleep hygiene. - Worsened confusion overnight 7/1-may be due to increasing steroids and ongoing intracranial issues. Steroids were decreased. Confusion improved or resolved. -01/14/16 CT brain--worsen right parietal lobe hematoma and edema--> dexamethasone increased to 4 mg every 6 hours  Hyperglycemia/diabetes mellitus type 2 - Secondary to steroids. Currently on SSI. Reasonable inpatient control. -11/11/2014 hemoglobin A1c 6.6 -recheck hemoglobin A1c  Lower extremity edema - May be multifactorial secondary to hypoalbuminemia, fluid resuscitation early on during hospitalization and? Chronic diastolic CHF. As per family, PCP was adjusting diuretic dose as outpatient. - 2-D echo 12/30/15: LVEF 55-60 percent. Moderate focal basal hypertrophy of the septum and mild hypertrophy of the posterior wall. - Trial of IV Lasix. -11.3L since admission. Continue diuresis. Improving. Discussed with RN regarding strict intake and output charting. Still has some volume overload -01/02/2016 venous duplex legs negative for DVT  Nausea and vomiting - ? Related to problem #1. Treat supportively.  -States that he has not vomited since 01/11/16.  Moderate malnutrition - Management per dietitian.   DVT prophylaxis: SCD's Code Status: Full Family Communication: Discussed with daughter at bedside 01/16/16 Disposition Plan: to be determined >DC to SNF when medically ready, possibly towards the end of this week .  Consultants:   CCM- signed off  Palliative Team  [facility] are consultants  Procedures:   Stereotactic Radiosurgery for solitary brain metastasis on 01/05/16  Extubated  Antimicrobials:   Cefazolin 1 dose 6/27.  Levofloxacin 6/27 >. 7/1 Code Status: FULL  DVT Prophylaxis:  SCDs   Procedures: As Listed in Progress Note Above  Antibiotics: None   Subjective:   Objective: Filed Vitals:   01/15/16 0802 01/15/16 0949 01/15/16 1340 01/15/16 1417  BP:  102/67 123/77   Pulse:  83 67   Temp:  98.9 F (37.2 C) 98.2 F (36.8 C)   TempSrc:  Oral Oral   Resp:  18 18   Height:      Weight:      SpO2: 98% 100% 100% 98%    Intake/Output Summary (Last 24 hours) at 01/15/16 1655 Last data filed at 01/15/16 1030  Gross per 24 hour  Intake    120 ml  Output   3250 ml  Net  -3130 ml   Weight change: -1.225 kg (-2 lb 11.2 oz) Exam:   General:  Pt is alert, follows commands appropriately, not in acute distress  HEENT: No icterus, No thrush, No neck mass, [place]/AT  Cardiovascular: RRR, S1/S2, no rubs, no gallops  Respiratory: CTA bilaterally, no wheezing, no crackles, no rhonchi  Abdomen: Soft/+BS, non tender, non distended, no guarding  Extremities: No edema, No lymphangitis, No petechiae, No rashes, no synovitis   Data Reviewed: I have personally reviewed following labs and imaging studies Basic Metabolic Panel:  Recent Labs Lab 01/10/16 0854 01/11/16 1130 01/12/16 0527 01/13/16 0430 01/14/16 0500  NA 131* 132* 132* 133* 133*  K 5.1 4.3 4.4 4.2 4.1  CL 91* 92* 93* 92* 92*  CO2 30 36* 35* 36* 36*  GLUCOSE 126* 136* 113* 116* 114*  BUN 20 21* 22* 22* 21*  CREATININE 0.81 0.82 0.75 0.68 0.69  CALCIUM 8.1* 7.9* 8.3* 7.9* 8.0*   Liver Function Tests:  Recent Labs Lab 01/11/16 1130  AST 23  ALT 15*  ALKPHOS 35*  BILITOT 0.3  PROT 4.4*  ALBUMIN 2.4*   No results for input(s): LIPASE, AMYLASE in the last 168 hours. No results  for input(s): AMMONIA in the last 168 hours. Coagulation Profile: No results for input(s): INR, PROTIME in the last 168 hours. CBC:  Recent Labs Lab 01/09/16 0840 01/12/16 0527 01/13/16 0430 01/14/16 0500  WBC 6.8 6.2 5.8 5.6  HGB 10.4* 9.3* 8.6* 8.9*  HCT 32.2* 28.6* 26.7* 27.8*  MCV 94.2 93.2  95.0 93.9  PLT 128* 121* 120* 121*   Cardiac Enzymes: No results for input(s): CKTOTAL, CKMB, CKMBINDEX, TROPONINI in the last 168 hours. BNP: Invalid input(s): POCBNP CBG:  Recent Labs Lab 01/14/16 1120 01/14/16 1723 01/14/16 2210 01/15/16 0641 01/15/16 1114  GLUCAP 109* 116* 190* 146* 126*   HbA1C: No results for input(s): HGBA1C in the last 72 hours. Urine analysis:    Component Value Date/Time   COLORURINE YELLOW 01/14/2016 1537   APPEARANCEUR CLEAR 01/14/2016 1537   LABSPEC 1.013 01/14/2016 1537   PHURINE 7.5 01/14/2016 1537   GLUCOSEU NEGATIVE 01/14/2016 1537   HGBUR NEGATIVE 01/14/2016 1537   BILIRUBINUR NEGATIVE 01/14/2016 1537   KETONESUR NEGATIVE 01/14/2016 1537   PROTEINUR NEGATIVE 01/14/2016 1537   UROBILINOGEN 1.0 11/11/2014 0900   NITRITE NEGATIVE 01/14/2016 1537   LEUKOCYTESUR NEGATIVE 01/14/2016 1537   Sepsis Labs: '@LABRCNTIP'$ (procalcitonin:4,lacticidven:4) ) Recent Results (from the past 240 hour(s))  Aerobic/Anaerobic Culture (surgical/deep wound)     Status: None   Collection Time: 01/06/16 10:10 AM  Result Value Ref Range Status   Specimen Description WOUND  Final   Special Requests RIGHT PARIETAL OCCIPITAL MASS  Final   Gram Stain   Final    MODERATE WBC PRESENT, PREDOMINANTLY MONONUCLEAR NO ORGANISMS SEEN Gram Stain Report Called to,Read Back By and Verified With: G CRAM,MD AT 1116 BY L BENFIELD    Culture No growth aerobically or anaerobically.  Final   Report Status 01/11/2016 FINAL  Final  Culture, Urine     Status: None   Collection Time: 01/14/16  3:37 PM  Result Value Ref Range Status   Specimen Description URINE, CATHETERIZED  Final   Special Requests NONE  Final   Culture NO GROWTH  Final   Report Status 01/15/2016 FINAL  Final     Scheduled Meds: . amLODipine  5 mg Oral Daily  . budesonide (PULMICORT) nebulizer solution  0.25 mg Nebulization BID  . cloNIDine  0.1 mg Oral BID  . dexamethasone  4 mg Oral Q6H  . docusate  sodium  100 mg Oral BID  . famotidine  40 mg Oral BID  . feeding supplement (ENSURE ENLIVE)  237 mL Oral BID BM  . furosemide  40 mg Intravenous BID  . guaiFENesin  400 mg Oral BID  . insulin aspart  0-9 Units Subcutaneous TID WC  . ipratropium-albuterol  3 mL Nebulization TID  . isosorbide mononitrate  30 mg Oral Daily  . levETIRAcetam  1,500 mg Oral BID  . metoprolol tartrate  50 mg Oral BID  . pantoprazole  40 mg Oral BID  . pramipexole  1 mg Oral QHS  . sodium chloride  2 g Oral TID WC  . tamsulosin  0.4 mg Oral Daily  . vitamin B-12  500 mcg Oral Daily   Continuous Infusions:   Procedures/Studies: Dg Chest 2 View  12/22/2015  CLINICAL DATA:  Shortness of breath, wheezing, chest pressure radiating to the left arm. History of hypertension. History of lung cancer metastatic to brain post right upper lobectomy. EXAM: CHEST  2 VIEW COMPARISON:  12/13/2015 FINDINGS: Power port type central venous catheter with tip over the mid SVC region. No  pneumothorax. Postoperative changes in the right lung with surgical clips and volume loss. No focal airspace disease or consolidation in the lungs. Blunting of the right costophrenic angle likely due to postoperative pleural thickening. Old right thoracotomy. Surgical clips in the base the neck. No pneumothorax. Mediastinal contours appear intact. Calcification of the aorta. IMPRESSION: Postoperative changes with associated volume loss in the right lung. No evidence of active pulmonary disease. Electronically Signed   By: Lucienne Capers M.D.   On: 12/22/2015 04:31   Dg Abd 1 View  01/14/2016  CLINICAL DATA:  Constipation, ileus EXAM: ABDOMEN - 1 VIEW COMPARISON:  None FINDINGS: Increased stool and small gas throughout colon including rectum. Air-filled upper normal caliber small bowel loops throughout abdomen. Nonobstructive bowel gas pattern top bowel wall thickening. Gas within stomach. Bones demineralized with degenerative disc disease changes lumbar  spine. No urine tract calcification. IMPRESSION: Nonobstructive bowel gas pattern with air-filled small bowel loops and increased stool and small amount of gas throughout colon. Electronically Signed   By: Lavonia Dana M.D.   On: 01/14/2016 20:26   Ct Head Wo Contrast  01/14/2016  CLINICAL DATA:  Metastatic lung cancer. Postop craniotomy. Worsening left-sided deficit. Intracranial hemorrhage. EXAM: CT HEAD WITHOUT CONTRAST TECHNIQUE: Contiguous axial images were obtained from the base of the skull through the vertex without intravenous contrast. COMPARISON:  CT head 01/10/2016 FINDINGS: Right parietal craniotomy for tumor resection. High-density hemorrhage in the high right parietal lobe has increased in size. This is best seen on sagittal and coronal images. Extensive vasogenic edema in the right parietal white matter also has progressed. Small right parietal subdural hematoma unchanged at the craniotomy site. No shift of the midline structures.  No other areas of hemorrhage Negative for acute ischemia. IMPRESSION: Postop hematoma right parietal lobe has increased since the prior CT. Large amount of surrounding edema also has progressed. No shift of the midline structures. Small right parietal subdural hematoma unchanged Electronically Signed   By: Franchot Gallo M.D.   On: 01/14/2016 21:57   Ct Head Wo Contrast  01/10/2016  CLINICAL DATA:  75 year old male with metastatic lung cancer post craniotomy. Subsequent encounter. EXAM: CT HEAD WITHOUT CONTRAST TECHNIQUE: Contiguous axial images were obtained from the base of the skull through the vertex without intravenous contrast. COMPARISON:  01/09/2016 head CT.  12/13/2015 brain MR. FINDINGS: Post right parietal craniotomy. Gas and blood containing epidural collection below the craniotomy site with maximal thickness of 5.6 mm without significant change. Large right parietal lobe hematoma appears minimally more prominent than on yesterday's examination now measuring  3.7 x 4.3 x 4.2 cm versus prior 3.2 x 3.7 x 4 cm. Surrounding vasogenic edema and mass effect upon the right lateral ventricle which is displaced inferiorly. MR detected intracranial metastatic lesions are not as well delineated on the present unenhanced CT. IMPRESSION: Post right parietal craniotomy. Gas and blood containing epidural collection below the craniotomy site with maximal thickness of 5.6 mm without significant change. Large right parietal lobe hematoma appears minimally more prominent than on yesterday's examination now measuring 3.7 x 4.3 x 4.2 cm versus prior 3.2 x 3.7 x 4 cm. Surrounding vasogenic edema and mass effect upon the right lateral ventricle which is displaced inferiorly. Electronically Signed   By: Genia Del M.D.   On: 01/10/2016 11:06   Ct Head Wo Contrast  01/09/2016  CLINICAL DATA:  Sudden onset of acute confusion and severe headache. Recent craniotomy for metastatic lung cancer. EXAM: CT HEAD WITHOUT CONTRAST TECHNIQUE: Contiguous  axial images were obtained from the base of the skull through the vertex without intravenous contrast. COMPARISON:  Brain MRI 12/25/2015 FINDINGS: Sequelae of interval right parietal craniotomy are identified. There is a 4.5 x 2.8 x 4.0 cm hematoma in the right parietal lobe deep to the resection site. There is mild surrounding vasogenic edema with regional sulcal effacement but no midline shift. There is a small extra-axial collection/hematoma subjacent to the craniotomy which measures up to 7 mm in thickness. Elsewhere, there is no evidence of acute infarct or mass. The small metastases in the left frontal lobe and right cerebellum on MRI are not clearly seen on this noncontrast CT. There is mild global cerebral atrophy. Periventricular white matter hypodensities are nonspecific but compatible with mild chronic small vessel ischemic disease. Prior right cataract extraction is noted. Skin staples and swelling are noted in the right parietal scalp.  Small amount of secretions are present in the right sphenoid sinus. There is a trace left mastoid effusion. Calcified atherosclerosis is noted at the skullbase. IMPRESSION: Interval postoperative changes with new 4.5 cm right parietal hematoma. Mild surrounding edema without midline shift. Critical Value/emergent results were called by telephone at the time of interpretation on 01/09/2016 at 10:22 am to Dr. Kary Kos , who verbally acknowledged these results. Electronically Signed   By: Logan Bores M.D.   On: 01/09/2016 10:27   Ct Chest W Contrast  12/29/2015  CLINICAL DATA:  Metastatic squamous cell right upper lobe lung carcinoma presents for restaging on treatment. EXAM: CT CHEST, ABDOMEN, AND PELVIS WITH CONTRAST TECHNIQUE: Multidetector CT imaging of the chest, abdomen and pelvis was performed following the standard protocol during bolus administration of intravenous contrast. CONTRAST:  116m ISOVUE-300 IOPAMIDOL (ISOVUE-300) INJECTION 61% COMPARISON:  10/10/2015 CT chest, abdomen and pelvis. FINDINGS: CT CHEST Mediastinum/Nodes: Normal heart size. No significant pericardial fluid/thickening. Right internal jugular MediPort terminates in the lower third of the superior vena cava. Left main, left anterior descending, left circumflex and right coronary atherosclerosis. Atherosclerotic nonaneurysmal thoracic aorta. Normal caliber pulmonary arteries. No central pulmonary emboli. No discrete thyroid nodules. Unremarkable esophagus. No pathologically enlarged axillary, mediastinal or hilar lymph nodes. Lungs/Pleura: No pneumothorax. Stable small simple layering right pleural effusion. New trace layering left pleural effusion. Layering secretions are present in the lower trachea and bilateral mainstem bronchi. Status post right upper lobectomy. Mild centrilobular emphysema and diffuse bronchial wall thickening. No acute consolidative airspace disease or significant pulmonary nodules. No appreciable change in  patchy subpleural reticulation throughout both lungs. Musculoskeletal: No aggressive appearing focal osseous lesions. Stable incompletely healed nondisplaced posterior right seventh rib fracture. Stable healed deformity in the lateral right eighth rib. CT ABDOMEN AND PELVIS Hepatobiliary: Normal liver with no liver mass. Normal gallbladder with no radiopaque cholelithiasis. No biliary ductal dilatation. Pancreas: Normal, with no mass or duct dilation. Spleen: Normal size. No mass. Adrenals/Urinary Tract: Normal adrenals. No hydronephrosis. No renal masses. Normal bladder. Stomach/Bowel: Grossly normal stomach. Normal caliber small bowel with no small bowel wall thickening. Stable moderate duodenal diverticulum in the superior third portion of the duodenum. Normal appendix. Normal large bowel with no diverticulosis, large bowel wall thickening or pericolonic fat stranding. Vascular/Lymphatic: Atherosclerotic abdominal aorta with 2.7 cm infrarenal abdominal aortic aneurysm, unchanged. Patent portal, splenic, hepatic and renal veins. No pathologically enlarged lymph nodes in the abdomen or pelvis. Reproductive: Top-normal size prostate. Other: No pneumoperitoneum.  Trace free fluid in the pelvis. Musculoskeletal: No aggressive appearing focal osseous lesions. Marked degenerative changes in the lumbar spine. Mild  anasarca. IMPRESSION: 1. No evidence of metastatic disease in the chest, abdomen or pelvis . 2. No evidence of local tumor recurrence in the right lung status post right upper lobectomy . 3. Stable small right and new trace left pleural effusions. 4. Additional findings include left main and 3 vessel coronary atherosclerosis, mild centrilobular emphysema with diffuse bronchial wall thickening suggesting of COPD, and stable 2.7 cm infrarenal abdominal aortic aneurysm. Electronically Signed   By: Ilona Sorrel M.D.   On: 12/29/2015 08:25   Mr Jeri Cos OY Contrast  12/25/2015  CLINICAL DATA:  Metastatic lung  cancer.  Chemo and radiosurgery. EXAM: MRI HEAD WITHOUT AND WITH CONTRAST TECHNIQUE: Multiplanar, multiecho pulse sequences of the brain and surrounding structures were obtained without and with intravenous contrast. CONTRAST:  2m MULTIHANCE GADOBENATE DIMEGLUMINE 529 MG/ML IV SOLN COMPARISON:  MRI 12/13/2015, 10/06/2015 FINDINGS: Image quality degraded by motion. The patient was not able to hold still. This amount of motion could obscure detection of small enhancing lesions. Right parietal lesion has improved in size now measuring 17 x 18 mm. Decreased surrounding edema. Left frontal lesion measures 7 mm with decreased surrounding edema. This lesion is slightly smaller. Left inferior frontal lobe lesion 5 mm and slightly smaller. 3 mm lesion right posterior cerebellum slightly improved. No new lesions identified. Ventricle size normal. Generalized atrophy. No shift of the midline structures Negative for hemorrhage Negative for acute infarct. Mild chronic microvascular ischemic change in the white matter and pons. Mild mucosal edema paranasal sinuses. IMPRESSION: Four metastatic deposits show interval improvement from the prior study with smaller enhancing lesions and decreased edema. No new lesions identified. Electronically Signed   By: CFranchot GalloM.D.   On: 12/25/2015 15:35   Ct Abdomen Pelvis W Contrast  12/29/2015  CLINICAL DATA:  Metastatic squamous cell right upper lobe lung carcinoma presents for restaging on treatment. EXAM: CT CHEST, ABDOMEN, AND PELVIS WITH CONTRAST TECHNIQUE: Multidetector CT imaging of the chest, abdomen and pelvis was performed following the standard protocol during bolus administration of intravenous contrast. CONTRAST:  1044mISOVUE-300 IOPAMIDOL (ISOVUE-300) INJECTION 61% COMPARISON:  10/10/2015 CT chest, abdomen and pelvis. FINDINGS: CT CHEST Mediastinum/Nodes: Normal heart size. No significant pericardial fluid/thickening. Right internal jugular MediPort terminates in the  lower third of the superior vena cava. Left main, left anterior descending, left circumflex and right coronary atherosclerosis. Atherosclerotic nonaneurysmal thoracic aorta. Normal caliber pulmonary arteries. No central pulmonary emboli. No discrete thyroid nodules. Unremarkable esophagus. No pathologically enlarged axillary, mediastinal or hilar lymph nodes. Lungs/Pleura: No pneumothorax. Stable small simple layering right pleural effusion. New trace layering left pleural effusion. Layering secretions are present in the lower trachea and bilateral mainstem bronchi. Status post right upper lobectomy. Mild centrilobular emphysema and diffuse bronchial wall thickening. No acute consolidative airspace disease or significant pulmonary nodules. No appreciable change in patchy subpleural reticulation throughout both lungs. Musculoskeletal: No aggressive appearing focal osseous lesions. Stable incompletely healed nondisplaced posterior right seventh rib fracture. Stable healed deformity in the lateral right eighth rib. CT ABDOMEN AND PELVIS Hepatobiliary: Normal liver with no liver mass. Normal gallbladder with no radiopaque cholelithiasis. No biliary ductal dilatation. Pancreas: Normal, with no mass or duct dilation. Spleen: Normal size. No mass. Adrenals/Urinary Tract: Normal adrenals. No hydronephrosis. No renal masses. Normal bladder. Stomach/Bowel: Grossly normal stomach. Normal caliber small bowel with no small bowel wall thickening. Stable moderate duodenal diverticulum in the superior third portion of the duodenum. Normal appendix. Normal large bowel with no diverticulosis, large bowel wall thickening or  pericolonic fat stranding. Vascular/Lymphatic: Atherosclerotic abdominal aorta with 2.7 cm infrarenal abdominal aortic aneurysm, unchanged. Patent portal, splenic, hepatic and renal veins. No pathologically enlarged lymph nodes in the abdomen or pelvis. Reproductive: Top-normal size prostate. Other: No  pneumoperitoneum.  Trace free fluid in the pelvis. Musculoskeletal: No aggressive appearing focal osseous lesions. Marked degenerative changes in the lumbar spine. Mild anasarca. IMPRESSION: 1. No evidence of metastatic disease in the chest, abdomen or pelvis . 2. No evidence of local tumor recurrence in the right lung status post right upper lobectomy . 3. Stable small right and new trace left pleural effusions. 4. Additional findings include left main and 3 vessel coronary atherosclerosis, mild centrilobular emphysema with diffuse bronchial wall thickening suggesting of COPD, and stable 2.7 cm infrarenal abdominal aortic aneurysm. Electronically Signed   By: Ilona Sorrel M.D.   On: 12/29/2015 08:25   Dg Chest Portable 1 View  12/30/2015  CLINICAL DATA:  75 year old male with shortness of breath and wheezing EXAM: PORTABLE CHEST 1 VIEW COMPARISON:  CT dated 12/29/2015 FINDINGS: Single portable view of the chest demonstrates emphysematous changes of the lungs. There is a small right pleural effusion as seen on the prior CT. There is associated partial compressive atelectasis of the right lung base. Underlying infiltrate is not excluded. No focal consolidation or pneumothorax. Right pectoral Port-A-Cath with tip over central SVC. 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 The cardiac silhouette is within normal limits with no acute osseous pathology. IMPRESSION: Small right pleural effusion. No focal consolidation or pneumothorax. Electronically Signed   By: Anner Crete M.D.   On: 12/30/2015 02:12    Eve Rey, DO  [facility] Pager [phone]  If 7PM-7AM, please contact night-coverage [email or web address] Password [email or web address] 01/15/2016, 4:55 PM   LOS: 9 days

## 2016-01-15 NOTE — Care Management Important Message (Signed)
Important Message  Patient Details  Name: [name] MRN: [id number] Date of Birth: 1941-04-26   Medicare Important Message Given:  Yes    Loann Quill 01/15/2016, 9:00 AM

## 2016-01-15 NOTE — Care Management Note (Signed)
Case Management Note  Patient Details  Name: [name] MRN: [id number] Date of Birth: 1940/12/20  Subjective/Objective:                    Action/Plan: Pt with ileus, and increased lt sided weakness. CM following for discharge needs.   Expected Discharge Date:                  Expected Discharge Plan:     In-House Referral:     Discharge planning Services     Post Acute Care Choice:    Choice offered to:     DME Arranged:    DME Agency:     HH Arranged:    HH Agency:     Status of Service:  In process, will continue to follow  If discussed at Long Length of Stay Meetings, dates discussed:    Additional Comments:  Pollie Friar, RN 01/15/2016, 11:02 AM

## 2016-01-15 NOTE — Progress Notes (Signed)
Patient ID: [name], male   DOB: Jul 15, 1940, 75 y.o.   MRN: [id number] Much better today on increased dose steroids increased movement left side improved mental status positive BM last night  Awake alert increased movement strength left side  CT scan looks stable no additional hemorrhage as I re-windowed it measured the same sequence the radiologist previously measured I see no change in increased hemorrhage size. KUB shows ileus and constipation  Work with physical occupational therapy and start looking forward to placement may be the first part of next week.

## 2016-01-16 LAB — GLUCOSE, CAPILLARY
GLUCOSE-CAPILLARY: 119 mg/dL — AB (ref 65–99)
Glucose-Capillary: 120 mg/dL — ABNORMAL HIGH (ref 65–99)
Glucose-Capillary: 123 mg/dL — ABNORMAL HIGH (ref 65–99)

## 2016-01-16 LAB — CBC
HCT: 24.5 % — ABNORMAL LOW (ref 39.0–52.0)
Hemoglobin: 8.1 g/dL — ABNORMAL LOW (ref 13.0–17.0)
MCH: 30.9 pg (ref 26.0–34.0)
MCHC: 33.1 g/dL (ref 30.0–36.0)
MCV: 93.5 fL (ref 78.0–100.0)
Platelets: 115 10*3/uL — ABNORMAL LOW (ref 150–400)
RBC: 2.62 MIL/uL — ABNORMAL LOW (ref 4.22–5.81)
RDW: 15.4 % (ref 11.5–15.5)
WBC: 5.8 10*3/uL (ref 4.0–10.5)

## 2016-01-16 LAB — BASIC METABOLIC PANEL
ANION GAP: 6 (ref 5–15)
BUN: 28 mg/dL — ABNORMAL HIGH (ref 6–20)
CALCIUM: 7.8 mg/dL — AB (ref 8.9–10.3)
CO2: 34 mmol/L — AB (ref 22–32)
CREATININE: 0.78 mg/dL (ref 0.61–1.24)
Chloride: 93 mmol/L — ABNORMAL LOW (ref 101–111)
GLUCOSE: 122 mg/dL — AB (ref 65–99)
Potassium: 4 mmol/L (ref 3.5–5.1)
Sodium: 133 mmol/L — ABNORMAL LOW (ref 135–145)

## 2016-01-16 LAB — MAGNESIUM: Magnesium: 2.4 mg/dL (ref 1.7–2.4)

## 2016-01-16 LAB — FOLATE RBC
Folate, Hemolysate: 269.7 ng/mL
Folate, RBC: 977 ng/mL (ref >498)
Hematocrit: 27.6 % — ABNORMAL LOW (ref 37.5–51.0)

## 2016-01-16 LAB — HEMOGLOBIN A1C
HEMOGLOBIN A1C: 5.9 % — AB (ref 4.8–5.6)
Mean Plasma Glucose: 123 mg/dL

## 2016-01-16 MED ORDER — HEPARIN SOD (PORK) LOCK FLUSH 100 UNIT/ML IV SOLN
500.0000 [IU] | INTRAVENOUS | Status: AC | PRN
  Administered 2016-01-16: 500 [IU]

## 2016-01-16 MED ORDER — FAMOTIDINE 40 MG PO TABS: 40.0000 mg | ORAL_TABLET | Freq: Two times a day (BID) | ORAL | Status: AC

## 2016-01-16 MED ORDER — SODIUM CHLORIDE 1 G PO TABS: 2.0000 g | ORAL_TABLET | Freq: Three times a day (TID) | ORAL | Status: AC

## 2016-01-16 MED ORDER — DEXAMETHASONE 4 MG PO TABS: 4.0000 mg | ORAL_TABLET | Freq: Four times a day (QID) | ORAL | Status: AC

## 2016-01-16 MED ORDER — METOPROLOL TARTRATE 50 MG PO TABS: 50.0000 mg | ORAL_TABLET | Freq: Two times a day (BID) | ORAL | Status: AC

## 2016-01-16 MED ORDER — CYANOCOBALAMIN 500 MCG PO TABS: 500.0000 ug | ORAL_TABLET | Freq: Every day | ORAL | Status: AC

## 2016-01-16 MED ORDER — ENSURE ENLIVE PO LIQD: 237.0000 mL | Freq: Two times a day (BID) | ORAL | Status: AC

## 2016-01-16 MED ORDER — DOCUSATE SODIUM 100 MG PO CAPS: 100.0000 mg | ORAL_CAPSULE | Freq: Two times a day (BID) | ORAL | Status: AC

## 2016-01-16 MED ORDER — TAMSULOSIN HCL 0.4 MG PO CAPS: 0.4000 mg | ORAL_CAPSULE | Freq: Every day | ORAL | Status: AC

## 2016-01-16 MED ORDER — BUDESONIDE 0.25 MG/2ML IN SUSP: 0.2500 mg | Freq: Two times a day (BID) | RESPIRATORY_TRACT | Status: AC

## 2016-01-16 MED ORDER — SENNA 8.6 MG PO TABS: 2.0000 | ORAL_TABLET | Freq: Every day | ORAL | Status: AC

## 2016-01-16 NOTE — Progress Notes (Signed)
Discharge orders received, Pt for discharge  To SNF, report called to Lorenza Cambridge. IV d/c'd. D/c instructions and RX given with verbalized understanding. Ptar transported Theatre manager.

## 2016-01-16 NOTE — Progress Notes (Signed)
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 Post surgery, admitted to ICU and CCM provided medical consultation. Transferred to floor on 6/29 &TRH assumed consultation services. Worsening mental status 7/1 and CT showing worsening venous infarction with mass effect. Mental status improved after reducing steroids. Diuresing for lower extremity edema. Plans for SNF and of this week.  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 - Currently on dexamethasone and on Keppra for seizures.  - Intra-Op cultures negative. - Intra-Op pathology of brain tumor: Metastatic squamous cell carcinoma consistent with lung primary - Repeat head CT 6/30: New 4.5 cm right parietal hematoma. Mild surrounding edema without midline shift. - CT heads 7/1: Gas and blood containing epidural collection below the craniotomy site with maximal thickness of 5.6 mm without significant change. Enlarging right parietal lobe hematoma with surrounding vasogenic edema and mass effect upon  right lateral ventricle. - Neurosurgery increased Decadron and added IV Lasix.  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 No reported seizures. - d/c tramadol as this lowers seizure threshold  RLS - Continue Mirapex.  Oxygen dependent and GOLD 3-4 COPD/chronic hypoxic respiratory failure - On home oxygen between 2-3 L/m continuously. Former tobacco abuse history. - Continue pulmonary toilet. - 01/15/16-->CXR--no infiltrates or effusions  Stage IV squamous cell carcinoma of lung with metastasis to brain, and adrenals and lymphangitic spread - See's [name] and Dr. Lisbeth Renshaw - Palliative care consulted by CCM prior to moving out of ICU. As per palliative care consultation, continued aggressive care and full code. -01/15/16--case discussed with palliative medicine-->pt wants full scope of treatment, upset when given any type of bad or negative information  Essential hypertension - controlled. Currently on amlodipine, clonidine, Imdur, metoprolol.  -Lisinopril discontinued this admission--BP remains stable, acceptable   PVD/CAD - Discussed with neurosurgery and discontinued aspirin on 7/1-resume when okay with neurosurgery.  Hyponatremia - ? SIADH. Periodically follow BMP.  -Agree with salt tablets. Also on IV Lasix since 01/10/16 - Stable and improving.  Constipation -add colace and bisacodyl-->small BM -01/14/16 AXR--> nonobstructive, increased O [place] with air-filled bowel loops. -fleets enema-->BM  Anemia of chronic disease - Hemoglobin gradually drifting down, 10.4 > 9.3 > 8.6>8.1.  -Follow CBC and transfuse if hemoglobin less than 7 g per DL. -Baseline hemoglobin 9-10 -repeat CBC in one week  Thrombocytopenia - Stable -check  B12--222-->start daily B12 supplement -RBC folate--pending  Confusion - May be multifactorial related to brain surgery,  brain metastases, new worsening brain hematoma, acute illness, hospitalization, pain medications. Hyponatremia seems stable and not very low as cause for his confusion. Treat the treatable. Minimize pain/sedative medications. Sleep hygiene. - Worsened confusion overnight 7/1-may be due to increasing steroids and ongoing intracranial issues. Steroids were decreased. Confusion improved or resolved. -01/14/16 CT brain--worsen right parietal lobe hematoma and edema--> dexamethasone increased to 4 mg every 6 hours -steroid dosing per neurosurgery  Hyperglycemia/diabetes mellitus type 2 - Secondary to steroids. Currently on SSI. Reasonable inpatient control. -11/11/2014 hemoglobin A1c 6.6 -recheck hemoglobin A1c--5.9 -allow for liberal glycemic control  Lower extremity edema - May be multifactorial secondary to hypoalbuminemia, fluid resuscitation early on during hospitalization and? Chronic diastolic CHF. As per family, PCP was adjusting diuretic dose as outpatient. - 2-D echo 12/30/15: LVEF 55-60 percent. Moderate focal basal hypertrophy of the septum and mild hypertrophy of the posterior wall. - Trial of IV Lasix. -11.3L since admission. Continue diuresis. Improving. Discussed with RN regarding strict intake and output charting. Still has some volume overload -01/02/2016 venous duplex legs negative for DVT -d/c with lasix 40 mg po bid which was his prior home dose  Nausea and vomiting - ? Related to problem #1. Treat supportively.  -States that he has not vomited since 01/11/16 -tolerating diet at time of d/c  Moderate malnutrition - Management per dietitian. - Ensure bid  Left arm abrasion -apply vaseline gauze and wrap in kerlix--change every 3 days and prn soilage  DVT prophylaxis: SCD's Code Status: Full Family Communication: Discussed with wife at bedside  01/16/16 Disposition Plan: STABLE FROM MEDICINE STANDPOINT TO D/C  Consultants:   CCM- signed off  Palliative Team  [facility] are consultants  Procedures:   Stereotactic Radiosurgery for solitary brain metastasis on 01/05/16  Extubated  Antimicrobials:   Cefazolin 1 dose 6/27.  Levofloxacin 6/27 >. 7/1 Code Status: FULL  DVT Prophylaxis: SCDs   Procedures: As Listed in Progress Note Above  Antibiotics: None   Subjective: Pt feel sob, unchanged from last several days.  Denies cp, f/c, HA, n/v/d.  Objective: Filed Vitals:   01/16/16 0500 01/16/16 0529 01/16/16 0843 01/16/16 0922  BP:  118/71  153/80  Pulse:  62  103  Temp:  98.1 F (36.7 C)  98.7 F (37.1 C)  TempSrc:  Oral  Oral  Resp:  18  20  Height:      Weight: 76.023 kg (167 lb 9.6 oz)     SpO2:  100% 98% 100%    Intake/Output Summary (Last 24 hours) at 01/16/16 1108 Last data filed at 01/16/16 0531  Gross per 24 hour  Intake    480 ml  Output   1300 ml  Net   -820 ml   Weight change: -6.124 kg (-13 lb 8 oz) Exam:   General:  Pt is alert, follows commands appropriately, not in acute distress  HEENT: No icterus, No thrush, No neck mass, [place]/AT  Cardiovascular: RRR, S1/S2, no rubs, no gallops  Respiratory: bibasilar crackles, no wheeze  Abdomen: Soft/+BS, non tender, non distended, no guarding  Extremities: trace LE edema, No lymphangitis, No petechiae, No rashes, no synovitis   Data Reviewed: I have personally reviewed following labs and imaging studies Basic Metabolic Panel:  Recent Labs Lab 01/11/16 1130 01/12/16 0527 01/13/16 0430 01/14/16 0500 01/16/16 0505  NA 132* 132* 133* 133* 133*  K 4.3 4.4 4.2 4.1 4.0  CL 92* 93* 92* 92* 93*  CO2 36* 35* 36* 36* 34*  GLUCOSE 136* 113* 116*  114* 122*  BUN 21* 22* 22* 21* 28*  CREATININE 0.82 0.75 0.68 0.69 0.78  CALCIUM 7.9* 8.3* 7.9* 8.0* 7.8*  MG  --   --   --   --  2.4   Liver Function Tests:  Recent Labs Lab 01/11/16 1130   AST 23  ALT 15*  ALKPHOS 35*  BILITOT 0.3  PROT 4.4*  ALBUMIN 2.4*   No results for input(s): LIPASE, AMYLASE in the last 168 hours. No results for input(s): AMMONIA in the last 168 hours. Coagulation Profile: No results for input(s): INR, PROTIME in the last 168 hours. CBC:  Recent Labs Lab 01/12/16 0527 01/13/16 0430 01/14/16 0500 01/16/16 0505  WBC 6.2 5.8 5.6 5.8  HGB 9.3* 8.6* 8.9* 8.1*  HCT 28.6* 26.7* 27.8* 24.5*  MCV 93.2 95.0 93.9 93.5  PLT 121* 120* 121* 115*   Cardiac Enzymes: No results for input(s): CKTOTAL, CKMB, CKMBINDEX, TROPONINI in the last 168 hours. BNP: Invalid input(s): POCBNP CBG:  Recent Labs Lab 01/15/16 0641 01/15/16 1114 01/15/16 1649 01/15/16 2141 01/16/16 0617  GLUCAP 146* 126* 153* 119* 120*   HbA1C:  Recent Labs  01/15/16 0430  HGBA1C 5.9*   Urine analysis:    Component Value Date/Time   COLORURINE YELLOW 01/14/2016 1537   APPEARANCEUR CLEAR 01/14/2016 1537   LABSPEC 1.013 01/14/2016 1537   PHURINE 7.5 01/14/2016 1537   GLUCOSEU NEGATIVE 01/14/2016 1537   HGBUR NEGATIVE 01/14/2016 1537   BILIRUBINUR NEGATIVE 01/14/2016 1537   KETONESUR NEGATIVE 01/14/2016 1537   PROTEINUR NEGATIVE 01/14/2016 1537   UROBILINOGEN 1.0 11/11/2014 0900   NITRITE NEGATIVE 01/14/2016 1537   LEUKOCYTESUR NEGATIVE 01/14/2016 1537   Sepsis Labs: '@LABRCNTIP'$ (procalcitonin:4,lacticidven:4) ) Recent Results (from the past 240 hour(s))  Culture, Urine     Status: None   Collection Time: 01/14/16  3:37 PM  Result Value Ref Range Status   Specimen Description URINE, CATHETERIZED  Final   Special Requests NONE  Final   Culture NO GROWTH  Final   Report Status 01/15/2016 FINAL  Final     Scheduled Meds: . amLODipine  5 mg Oral Daily  . budesonide (PULMICORT) nebulizer solution  0.25 mg Nebulization BID  . cloNIDine  0.1 mg Oral BID  . dexamethasone  4 mg Oral Q6H  . docusate sodium  100 mg Oral BID  . famotidine  40 mg Oral BID  .  feeding supplement (ENSURE ENLIVE)  237 mL Oral BID BM  . furosemide  40 mg Intravenous BID  . guaiFENesin  400 mg Oral BID  . insulin aspart  0-9 Units Subcutaneous TID WC  . ipratropium-albuterol  3 mL Nebulization TID  . isosorbide mononitrate  30 mg Oral Daily  . levETIRAcetam  1,500 mg Oral BID  . metoprolol tartrate  50 mg Oral BID  . pantoprazole  40 mg Oral BID  . pramipexole  1 mg Oral QHS  . sodium chloride  2 g Oral TID WC  . tamsulosin  0.4 mg Oral Daily  . vitamin B-12  500 mcg Oral Daily   Continuous Infusions:   Procedures/Studies: Dg Chest 2 View  12/22/2015  CLINICAL DATA:  Shortness of breath, wheezing, chest pressure radiating to the left arm. History of hypertension. History of lung cancer metastatic to brain post right upper lobectomy. EXAM: CHEST  2 VIEW COMPARISON:  12/13/2015 FINDINGS: Power port type central venous catheter with tip over the mid SVC region. No pneumothorax. Postoperative changes in the right lung with surgical clips and volume  loss. No focal airspace disease or consolidation in the lungs. Blunting of the right costophrenic angle likely due to postoperative pleural thickening. Old right thoracotomy. Surgical clips in the base the neck. No pneumothorax. Mediastinal contours appear intact. Calcification of the aorta. IMPRESSION: Postoperative changes with associated volume loss in the right lung. No evidence of active pulmonary disease. Electronically Signed   By: Lucienne Capers M.D.   On: 12/22/2015 04:31   Dg Abd 1 View  01/14/2016  CLINICAL DATA:  Constipation, ileus EXAM: ABDOMEN - 1 VIEW COMPARISON:  None FINDINGS: Increased stool and small gas throughout colon including rectum. Air-filled upper normal caliber small bowel loops throughout abdomen. Nonobstructive bowel gas pattern top bowel wall thickening. Gas within stomach. Bones demineralized with degenerative disc disease changes lumbar spine. No urine tract calcification. IMPRESSION:  Nonobstructive bowel gas pattern with air-filled small bowel loops and increased stool and small amount of gas throughout colon. Electronically Signed   By: Lavonia Dana M.D.   On: 01/14/2016 20:26   Ct Head Wo Contrast  01/14/2016  CLINICAL DATA:  Metastatic lung cancer. Postop craniotomy. Worsening left-sided deficit. Intracranial hemorrhage. EXAM: CT HEAD WITHOUT CONTRAST TECHNIQUE: Contiguous axial images were obtained from the base of the skull through the vertex without intravenous contrast. COMPARISON:  CT head 01/10/2016 FINDINGS: Right parietal craniotomy for tumor resection. High-density hemorrhage in the high right parietal lobe has increased in size. This is best seen on sagittal and coronal images. Extensive vasogenic edema in the right parietal white matter also has progressed. Small right parietal subdural hematoma unchanged at the craniotomy site. No shift of the midline structures.  No other areas of hemorrhage Negative for acute ischemia. IMPRESSION: Postop hematoma right parietal lobe has increased since the prior CT. Large amount of surrounding edema also has progressed. No shift of the midline structures. Small right parietal subdural hematoma unchanged Electronically Signed   By: Franchot Gallo M.D.   On: 01/14/2016 21:57   Ct Head Wo Contrast  01/10/2016  CLINICAL DATA:  75 year old male with metastatic lung cancer post craniotomy. Subsequent encounter. EXAM: CT HEAD WITHOUT CONTRAST TECHNIQUE: Contiguous axial images were obtained from the base of the skull through the vertex without intravenous contrast. COMPARISON:  01/09/2016 head CT.  12/13/2015 brain MR. FINDINGS: Post right parietal craniotomy. Gas and blood containing epidural collection below the craniotomy site with maximal thickness of 5.6 mm without significant change. Large right parietal lobe hematoma appears minimally more prominent than on yesterday's examination now measuring 3.7 x 4.3 x 4.2 cm versus prior 3.2 x 3.7 x 4  cm. Surrounding vasogenic edema and mass effect upon the right lateral ventricle which is displaced inferiorly. MR detected intracranial metastatic lesions are not as well delineated on the present unenhanced CT. IMPRESSION: Post right parietal craniotomy. Gas and blood containing epidural collection below the craniotomy site with maximal thickness of 5.6 mm without significant change. Large right parietal lobe hematoma appears minimally more prominent than on yesterday's examination now measuring 3.7 x 4.3 x 4.2 cm versus prior 3.2 x 3.7 x 4 cm. Surrounding vasogenic edema and mass effect upon the right lateral ventricle which is displaced inferiorly. Electronically Signed   By: Genia Del M.D.   On: 01/10/2016 11:06   Ct Head Wo Contrast  01/09/2016  CLINICAL DATA:  Sudden onset of acute confusion and severe headache. Recent craniotomy for metastatic lung cancer. EXAM: CT HEAD WITHOUT CONTRAST TECHNIQUE: Contiguous axial images were obtained from the base of the skull through the  vertex without intravenous contrast. COMPARISON:  Brain MRI 12/25/2015 FINDINGS: Sequelae of interval right parietal craniotomy are identified. There is a 4.5 x 2.8 x 4.0 cm hematoma in the right parietal lobe deep to the resection site. There is mild surrounding vasogenic edema with regional sulcal effacement but no midline shift. There is a small extra-axial collection/hematoma subjacent to the craniotomy which measures up to 7 mm in thickness. Elsewhere, there is no evidence of acute infarct or mass. The small metastases in the left frontal lobe and right cerebellum on MRI are not clearly seen on this noncontrast CT. There is mild global cerebral atrophy. Periventricular white matter hypodensities are nonspecific but compatible with mild chronic small vessel ischemic disease. Prior right cataract extraction is noted. Skin staples and swelling are noted in the right parietal scalp. Small amount of secretions are present in the  right sphenoid sinus. There is a trace left mastoid effusion. Calcified atherosclerosis is noted at the skullbase. IMPRESSION: Interval postoperative changes with new 4.5 cm right parietal hematoma. Mild surrounding edema without midline shift. Critical Value/emergent results were called by telephone at the time of interpretation on 01/09/2016 at 10:22 am to Dr. Kary Kos , who verbally acknowledged these results. Electronically Signed   By: Logan Bores M.D.   On: 01/09/2016 10:27   Ct Chest W Contrast  12/29/2015  CLINICAL DATA:  Metastatic squamous cell right upper lobe lung carcinoma presents for restaging on treatment. EXAM: CT CHEST, ABDOMEN, AND PELVIS WITH CONTRAST TECHNIQUE: Multidetector CT imaging of the chest, abdomen and pelvis was performed following the standard protocol during bolus administration of intravenous contrast. CONTRAST:  132m ISOVUE-300 IOPAMIDOL (ISOVUE-300) INJECTION 61% COMPARISON:  10/10/2015 CT chest, abdomen and pelvis. FINDINGS: CT CHEST Mediastinum/Nodes: Normal heart size. No significant pericardial fluid/thickening. Right internal jugular MediPort terminates in the lower third of the superior vena cava. Left main, left anterior descending, left circumflex and right coronary atherosclerosis. Atherosclerotic nonaneurysmal thoracic aorta. Normal caliber pulmonary arteries. No central pulmonary emboli. No discrete thyroid nodules. Unremarkable esophagus. No pathologically enlarged axillary, mediastinal or hilar lymph nodes. Lungs/Pleura: No pneumothorax. Stable small simple layering right pleural effusion. New trace layering left pleural effusion. Layering secretions are present in the lower trachea and bilateral mainstem bronchi. Status post right upper lobectomy. Mild centrilobular emphysema and diffuse bronchial wall thickening. No acute consolidative airspace disease or significant pulmonary nodules. No appreciable change in patchy subpleural reticulation throughout both  lungs. Musculoskeletal: No aggressive appearing focal osseous lesions. Stable incompletely healed nondisplaced posterior right seventh rib fracture. Stable healed deformity in the lateral right eighth rib. CT ABDOMEN AND PELVIS Hepatobiliary: Normal liver with no liver mass. Normal gallbladder with no radiopaque cholelithiasis. No biliary ductal dilatation. Pancreas: Normal, with no mass or duct dilation. Spleen: Normal size. No mass. Adrenals/Urinary Tract: Normal adrenals. No hydronephrosis. No renal masses. Normal bladder. Stomach/Bowel: Grossly normal stomach. Normal caliber small bowel with no small bowel wall thickening. Stable moderate duodenal diverticulum in the superior third portion of the duodenum. Normal appendix. Normal large bowel with no diverticulosis, large bowel wall thickening or pericolonic fat stranding. Vascular/Lymphatic: Atherosclerotic abdominal aorta with 2.7 cm infrarenal abdominal aortic aneurysm, unchanged. Patent portal, splenic, hepatic and renal veins. No pathologically enlarged lymph nodes in the abdomen or pelvis. Reproductive: Top-normal size prostate. Other: No pneumoperitoneum.  Trace free fluid in the pelvis. Musculoskeletal: No aggressive appearing focal osseous lesions. Marked degenerative changes in the lumbar spine. Mild anasarca. IMPRESSION: 1. No evidence of metastatic disease in the chest, abdomen  or pelvis . 2. No evidence of local tumor recurrence in the right lung status post right upper lobectomy . 3. Stable small right and new trace left pleural effusions. 4. Additional findings include left main and 3 vessel coronary atherosclerosis, mild centrilobular emphysema with diffuse bronchial wall thickening suggesting of COPD, and stable 2.7 cm infrarenal abdominal aortic aneurysm. Electronically Signed   By: Ilona Sorrel M.D.   On: 12/29/2015 08:25   Mr Jeri Cos GG Contrast  12/25/2015  CLINICAL DATA:  Metastatic lung cancer.  Chemo and radiosurgery. EXAM: MRI HEAD  WITHOUT AND WITH CONTRAST TECHNIQUE: Multiplanar, multiecho pulse sequences of the brain and surrounding structures were obtained without and with intravenous contrast. CONTRAST:  35m MULTIHANCE GADOBENATE DIMEGLUMINE 529 MG/ML IV SOLN COMPARISON:  MRI 12/13/2015, 10/06/2015 FINDINGS: Image quality degraded by motion. The patient was not able to hold still. This amount of motion could obscure detection of small enhancing lesions. Right parietal lesion has improved in size now measuring 17 x 18 mm. Decreased surrounding edema. Left frontal lesion measures 7 mm with decreased surrounding edema. This lesion is slightly smaller. Left inferior frontal lobe lesion 5 mm and slightly smaller. 3 mm lesion right posterior cerebellum slightly improved. No new lesions identified. Ventricle size normal. Generalized atrophy. No shift of the midline structures Negative for hemorrhage Negative for acute infarct. Mild chronic microvascular ischemic change in the white matter and pons. Mild mucosal edema paranasal sinuses. IMPRESSION: Four metastatic deposits show interval improvement from the prior study with smaller enhancing lesions and decreased edema. No new lesions identified. Electronically Signed   By: CFranchot GalloM.D.   On: 12/25/2015 15:35   Ct Abdomen Pelvis W Contrast  12/29/2015  CLINICAL DATA:  Metastatic squamous cell right upper lobe lung carcinoma presents for restaging on treatment. EXAM: CT CHEST, ABDOMEN, AND PELVIS WITH CONTRAST TECHNIQUE: Multidetector CT imaging of the chest, abdomen and pelvis was performed following the standard protocol during bolus administration of intravenous contrast. CONTRAST:  1046mISOVUE-300 IOPAMIDOL (ISOVUE-300) INJECTION 61% COMPARISON:  10/10/2015 CT chest, abdomen and pelvis. FINDINGS: CT CHEST Mediastinum/Nodes: Normal heart size. No significant pericardial fluid/thickening. Right internal jugular MediPort terminates in the lower third of the superior vena cava. Left  main, left anterior descending, left circumflex and right coronary atherosclerosis. Atherosclerotic nonaneurysmal thoracic aorta. Normal caliber pulmonary arteries. No central pulmonary emboli. No discrete thyroid nodules. Unremarkable esophagus. No pathologically enlarged axillary, mediastinal or hilar lymph nodes. Lungs/Pleura: No pneumothorax. Stable small simple layering right pleural effusion. New trace layering left pleural effusion. Layering secretions are present in the lower trachea and bilateral mainstem bronchi. Status post right upper lobectomy. Mild centrilobular emphysema and diffuse bronchial wall thickening. No acute consolidative airspace disease or significant pulmonary nodules. No appreciable change in patchy subpleural reticulation throughout both lungs. Musculoskeletal: No aggressive appearing focal osseous lesions. Stable incompletely healed nondisplaced posterior right seventh rib fracture. Stable healed deformity in the lateral right eighth rib. CT ABDOMEN AND PELVIS Hepatobiliary: Normal liver with no liver mass. Normal gallbladder with no radiopaque cholelithiasis. No biliary ductal dilatation. Pancreas: Normal, with no mass or duct dilation. Spleen: Normal size. No mass. Adrenals/Urinary Tract: Normal adrenals. No hydronephrosis. No renal masses. Normal bladder. Stomach/Bowel: Grossly normal stomach. Normal caliber small bowel with no small bowel wall thickening. Stable moderate duodenal diverticulum in the superior third portion of the duodenum. Normal appendix. Normal large bowel with no diverticulosis, large bowel wall thickening or pericolonic fat stranding. Vascular/Lymphatic: Atherosclerotic abdominal aorta with 2.7 cm infrarenal abdominal  aortic aneurysm, unchanged. Patent portal, splenic, hepatic and renal veins. No pathologically enlarged lymph nodes in the abdomen or pelvis. Reproductive: Top-normal size prostate. Other: No pneumoperitoneum.  Trace free fluid in the pelvis.  Musculoskeletal: No aggressive appearing focal osseous lesions. Marked degenerative changes in the lumbar spine. Mild anasarca. IMPRESSION: 1. No evidence of metastatic disease in the chest, abdomen or pelvis . 2. No evidence of local tumor recurrence in the right lung status post right upper lobectomy . 3. Stable small right and new trace left pleural effusions. 4. Additional findings include left main and 3 vessel coronary atherosclerosis, mild centrilobular emphysema with diffuse bronchial wall thickening suggesting of COPD, and stable 2.7 cm infrarenal abdominal aortic aneurysm. Electronically Signed   By: Ilona Sorrel M.D.   On: 12/29/2015 08:25   Dg Chest Port 1 View  01/15/2016  CLINICAL DATA:  Chronic respiratory failure, shortness of breath and cough, history COPD, hypertension, GERD, metastatic lung cancer EXAM: PORTABLE CHEST 1 VIEW COMPARISON:  Portable exam 1659 hours compared to 12/30/2015 FINDINGS: RIGHT jugular Port-A-Cath with tip projecting over SVC. Normal heart size, mediastinal contours and pulmonary vascularity. Atherosclerotic calcification aorta. Volume loss in RIGHT hemithorax with pleuroparenchymal scarring at RIGHT apex and RIGHT base, stable appearance since prior study. Surgical clips on minimal prominence at RIGHT hilum unchanged. Underlying emphysematous changes. No definite acute infiltrate, pleural effusion or pneumothorax. Bones demineralized. IMPRESSION: COPD changes with postsurgical changes of the RIGHT hemithorax. No acute abnormalities. Aortic atherosclerosis. Electronically Signed   By: Lavonia Dana M.D.   On: 01/15/2016 17:08   Dg Chest Portable 1 View  12/30/2015  CLINICAL DATA:  75 year old male with shortness of breath and wheezing EXAM: PORTABLE CHEST 1 VIEW COMPARISON:  CT dated 12/29/2015 FINDINGS: Single portable view of the chest demonstrates emphysematous changes of the lungs. There is a small right pleural effusion as seen on the prior CT. There is associated  partial compressive atelectasis of the right lung base. Underlying infiltrate is not excluded. No focal consolidation or pneumothorax. Right pectoral Port-A-Cath with tip over central SVC. 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 The cardiac silhouette is within normal limits with no acute osseous pathology. IMPRESSION: Small right pleural effusion. No focal consolidation or pneumothorax. Electronically Signed   By: Anner Crete M.D.   On: 12/30/2015 02:12    Millissa Deese, DO  [facility] Pager [phone]  If 7PM-7AM, please contact night-coverage [email or web address] Password [email or web address] 01/16/2016, 11:08 AM   LOS: 10 days

## 2016-01-16 NOTE — Clinical Social Work Note (Signed)
Clinical Social Worker facilitated patient discharge including contacting patient family and facility to confirm patient discharge plans.  Clinical information faxed to facility and family agreeable with plan.  CSW arranged ambulance transport via [facility] to [facility].  RN to call report prior to discharge.  Clinical Social Worker will sign off for now as social work intervention is no longer needed. Please consult [place] again if new need arises.  Glendon Axe, MSW, LCSWA [phone] 01/16/2016 3:43 PM

## 2016-01-16 NOTE — Progress Notes (Signed)
Subjective: Patient reports "I need something to push my feet against to get stronger"  Objective: Vital signs in last 24 hours: Temp:  [98.1 F (36.7 C)-98.9 F (37.2 C)] 98.1 F (36.7 C) (07/07 0529) Pulse Rate:  [62-83] 62 (07/07 0529) Resp:  [18] 18 (07/07 0529) BP: (102-134)/(67-94) 118/71 mmHg (07/07 0529) SpO2:  [98 %-100 %] 100 % (07/07 0529) Weight:  [76.023 kg (167 lb 9.6 oz)] 76.023 kg (167 lb 9.6 oz) (07/07 0500)  Intake/Output from previous day: 07/06 0701 - 07/07 0700 In: 600 [P.O.:600] Out: 2100 [Urine:2100] Intake/Output this shift:    Awake, conversant. Wife present, reporting decreased movement left side, to which he responds by raising his left leg and left arm into the air. Both he and his wife are hopeful of increased activity with rehab/SNF. Patient acknowledges his inconsistent strength left side and poor endurance.  Scalp incision with staples, well-approximated, without drainage or swelling. Appears to be doing well on Decadron qid.   Lab Results:  Recent Labs  01/14/16 0500 01/16/16 0505  WBC 5.6 5.8  HGB 8.9* 8.1*  HCT 27.8* 24.5*  PLT 121* 115*   BMET  Recent Labs  01/14/16 0500 01/16/16 0505  NA 133* 133*  K 4.1 4.0  CL 92* 93*  CO2 36* 34*  GLUCOSE 114* 122*  BUN 21* 28*  CREATININE 0.69 0.78  CALCIUM 8.0* 7.8*    Studies/Results: Dg Abd 1 View  01/14/2016  CLINICAL DATA:  Constipation, ileus EXAM: ABDOMEN - 1 VIEW COMPARISON:  None FINDINGS: Increased stool and small gas throughout colon including rectum. Air-filled upper normal caliber small bowel loops throughout abdomen. Nonobstructive bowel gas pattern top bowel wall thickening. Gas within stomach. Bones demineralized with degenerative disc disease changes lumbar spine. No urine tract calcification. IMPRESSION: Nonobstructive bowel gas pattern with air-filled small bowel loops and increased stool and small amount of gas throughout colon. Electronically Signed   By: Lavonia Dana  M.D.   On: 01/14/2016 20:26   Ct Head Wo Contrast  01/14/2016  CLINICAL DATA:  Metastatic lung cancer. Postop craniotomy. Worsening left-sided deficit. Intracranial hemorrhage. EXAM: CT HEAD WITHOUT CONTRAST TECHNIQUE: Contiguous axial images were obtained from the base of the skull through the vertex without intravenous contrast. COMPARISON:  CT head 01/10/2016 FINDINGS: Right parietal craniotomy for tumor resection. High-density hemorrhage in the high right parietal lobe has increased in size. This is best seen on sagittal and coronal images. Extensive vasogenic edema in the right parietal white matter also has progressed. Small right parietal subdural hematoma unchanged at the craniotomy site. No shift of the midline structures.  No other areas of hemorrhage Negative for acute ischemia. IMPRESSION: Postop hematoma right parietal lobe has increased since the prior CT. Large amount of surrounding edema also has progressed. No shift of the midline structures. Small right parietal subdural hematoma unchanged Electronically Signed   By: Franchot Gallo M.D.   On: 01/14/2016 21:57   Dg Chest Port 1 View  01/15/2016  CLINICAL DATA:  Chronic respiratory failure, shortness of breath and cough, history COPD, hypertension, GERD, metastatic lung cancer EXAM: PORTABLE CHEST 1 VIEW COMPARISON:  Portable exam 1659 hours compared to 12/30/2015 FINDINGS: RIGHT jugular Port-A-Cath with tip projecting over SVC. Normal heart size, mediastinal contours and pulmonary vascularity. Atherosclerotic calcification aorta. Volume loss in RIGHT hemithorax with pleuroparenchymal scarring at RIGHT apex and RIGHT base, stable appearance since prior study. Surgical clips on minimal prominence at RIGHT hilum unchanged. Underlying emphysematous changes. No definite acute infiltrate, pleural  effusion or pneumothorax. Bones demineralized. IMPRESSION: COPD changes with postsurgical changes of the RIGHT hemithorax. No acute abnormalities. Aortic  atherosclerosis. Electronically Signed   By: Lavonia Dana M.D.   On: 01/15/2016 17:08    Assessment/Plan:   LOS: 10 days  Dr. Vertell Limber will visit this morning between surgeries and discuss likelihood of d/c to SNF for rehabilitation.    Verdis Prime 01/16/2016, 8:21 AM

## 2016-01-16 NOTE — Clinical Social Work Placement (Signed)
   CLINICAL SOCIAL WORK PLACEMENT  NOTE  Date:  01/16/2016  Patient Details  Name: [name] MRN: [id number] Date of Birth: 1941-01-12  Clinical Social Work is seeking post-discharge placement for this patient at the [place] level of care (*CSW will initial, date and re-position this form in  chart as items are completed):  Yes   Patient/family provided with [place] Work Department's list of facilities offering this level of care within the geographic area requested by the patient (or if unable, by the patient's family).  Yes   Patient/family informed of their freedom to choose among providers that offer the needed level of care, that participate in Medicare, Medicaid or managed care program needed by the patient, have an available bed and are willing to accept the patient.  Yes   Patient/family informed of [facility] [place]'s ownership interest in [facility] and [facility], as well as of the fact that they are under no obligation to receive care at these facilities.  PASRR submitted to EDS on 01/09/16     PASRR number received on 01/09/16     Existing PASRR number confirmed on       FL2 transmitted to all facilities in geographic area requested by pt/family on 01/09/16     FL2 transmitted to all facilities within larger geographic area on       Patient informed that his/her managed care company has contracts with or will negotiate with certain facilities, including the following:        Yes   Patient/family informed of bed offers received.  Patient chooses bed at  [facility] )     Physician recommends and patient chooses bed at      Patient to be transferred to  [facility] ) on 01/16/16.  Patient to be transferred to facility by  [facility] )     Patient family notified on 01/16/16 of transfer.  Name of family member notified:   (Pt's spouse, [name] )      PHYSICIAN Please sign FL2, Please prepare priority discharge summary, including medications     Additional Comment:    _______________________________________________ Rozell Searing, LCSW 01/16/2016, 3:43 PM

## 2016-01-16 NOTE — Care Management Note (Signed)
Case Management Note  Patient Details  Name: [name] MRN: [id number] Date of Birth: [birth year]  Subjective/Objective:                    Action/Plan: Pt discharging to [facility]. No further needs per CM.  Expected Discharge Date:                  Expected Discharge Plan:     In-House Referral:     Discharge planning Services     Post Acute Care Choice:    Choice offered to:     DME Arranged:    DME Agency:     HH Arranged:    [place] Agency:     Status of Service:  Completed, signed off  If discussed at [employer] of Stay Meetings, dates discussed:    Additional Comments:  Pollie Friar, RN 01/16/2016, 11:45 AM

## 2016-01-16 NOTE — Progress Notes (Signed)
Nutrition Follow-up  DOCUMENTATION CODES:   Non-severe (moderate) malnutrition in context of chronic illness  INTERVENTION:   -D/c Ensure Enlive po BID, each supplement provides 350 kcal and 20 grams of protein -Magic Cup TID with meals -Mighty shake TID with meals  NUTRITION DIAGNOSIS:   Malnutrition related to chronic illness as evidenced by mild depletion of body fat, mild depletion of muscle mass.  Ongoing  GOAL:   Patient will meet greater than or equal to 90% of their needs  Progressing  MONITOR:   PO intake, Supplement acceptance, Labs, Weight trends, I & O's  REASON FOR ASSESSMENT:   Consult Assessment of nutrition requirement/status  ASSESSMENT:   75 year old gentleman who was admitted on 6/27 for craniotomy for right-sided right occipital lung met. He has a long-standing history of metastatic lung cancer undergone multiple stereotactic radiosurgical procedures 2 lesions in the brain has a new lesion right parietal occipital that is shown growth on serial MRIs.  Pt lying in bed and talking on phone at time of visit.   Per chart review, pt requires feeding assistance, which is baseline for pt.   Case dicussed with RN. She reports pt does not care for the food and PO intake remains poor (however, noted meal completion 50-100%). She reports pt does not like the Ensure and has been refusing, which is consistent with MAR. Per RN, pt is eating mainly Oreos and Reese's candies.   Palliative Care team following. Pt continues to desire aggressive treatment measures.   Plan to d/c to SNF once medically stable, likely later today.   Labs reviewed: Na: 133, CBGS: 126-153.   Diet Order:  Diet regular Room service appropriate?: Yes; Fluid consistency:: Thin  Skin:  Wound (see comment) (L elbow wound; surgical incision to head)  Last BM:  01/14/16  Height:   Ht Readings from Last 1 Encounters:  01/07/16 '5\' 9"'  (1.753 m)    Weight:   Wt Readings from Last 1  Encounters:  01/16/16 167 lb 9.6 oz (76.023 kg)    Ideal Body Weight:  72.7 kg  BMI:  Body mass index is 24.74 kg/(m^2).  Estimated Nutritional Needs:   Kcal:  2100-2300  Protein:  115-130 gm  Fluid:  2.3 L  EDUCATION NEEDS:   Education needs addressed  Sian Rockers A. Jimmye Norman, RD, LDN, CDE Pager: [phone] After hours Pager: [phone]

## 2016-01-16 NOTE — Consult Note (Signed)
   [facility] Inpatient Consult   01/16/2016  [name] 1940/12/19 [id number]  Patient screened for potential [place] Management services. Patient is eligible for [place]. Electronic medical record reveals patient's discharge plan is to [facility] for rehab.  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 Admitted for  planned surgical resection of tumor. [facility] Care Management services not appropriate at this current disposition time. For  [facility] Management consult or questions please contact:   Natividad Brood, RN BSN [facility] Liaison  [phone] business mobile phone Toll free office [phone]

## 2016-01-16 NOTE — Discharge Summary (Signed)
Physician Discharge Summary  Patient ID: [name] MRN: [id number] DOB/AGE: [birth year] 75 y.o.  Admit date: 01/06/2016 Discharge date: 01/16/2016  Admission Diagnoses: Right parietal occipital metastatic lung cancer   Discharge Diagnoses: Right parietal occipital metastatic lung cancer s/p Stereotactic right parietal occipital craniectomy for resection of) occipital met utilizing the BrainLab curve stereotactic navigation system  Active Problems:   Essential hypertension, benign   Metastatic lung cancer (metastasis from lung to other site) ([place])   COPD mixed type ([place])   S/P craniotomy   Acute on chronic respiratory failure ([place])   Palliative care encounter   Acute confusion   Malnutrition of moderate degree   DNR (do not resuscitate) discussion   Chronic respiratory failure with hypoxia ([place])   Thrombocytopenia ([place])   Hyponatremia   Discharged Condition: stable  Hospital Course: [name] was admitted for surgery for resection of right parietal occipital lesion. Following uncomplicated stereotactic craniectomy, he recovered well. He experienced some confusion day three post-op, got out of bed, and fell resulting in arm abrasion.  Increasing decadron to qid seemed to resolve his confusion. Left side neglect/weakness has improved.   Consults: hospitalist  Significant Diagnostic Studies: radiology: CT scan: pre- & post-op  Treatments: surgery: Stereotactic right parietal occipital craniectomy for resection of) occipital met utilizing the BrainLab curve stereotactic navigation system   Discharge Exam: Blood pressure 108/72, pulse 69, temperature 98.2 F (36.8 C), temperature source Oral, resp. rate 18, height 5' 9" (1.753 m), weight 76.023 kg (167 lb 9.6 oz), SpO2 100 %. Awake, conversant. Wife present, reporting decreased movement left side, to which he responds by raising his left leg and left arm into the air. Both he and his wife are hopeful of increased activity  with rehab/SNF. Patient acknowledges his inconsistent strength left side and poor endurance.  Scalp incision with staples, well-approximated, without drainage or swelling. Appears to be doing well on Decadron qid.    Disposition: Discharge to SNF. Staples may be removed 2 weeks post-op at SNF. Ok to shower, cleansing incision gently with showers. Plan to continue Decadron 32m QID (below sytem-generated list will be corrected to reflect qid); stop Tramadol; resume home dose of Lasix 480mPO daily; continue longstanding Keppra 75085m tabs BID PO. Call office to schedule follow up with Dr. CraSaintclair Halsted    Medication List    STOP taking these medications        azithromycin 250 MG tablet  Commonly known as:  ZITHROMAX     levofloxacin 500 MG tablet  Commonly known as:  LEVAQUIN     lisinopril 40 MG tablet  Commonly known as:  PRINIVIL,ZESTRIL     traMADol 50 MG tablet  Commonly known as:  ULTRAM      TAKE these medications        amLODipine 5 MG tablet  Commonly known as:  NORVASC  Take 5 mg by mouth daily.     aspirin EC 81 MG tablet  Take 81 mg by mouth daily. Reported on 12/22/2015     budesonide 0.25 MG/2ML nebulizer solution  Commonly known as:  PULMICORT  Take 2 mLs (0.25 mg total) by nebulization 2 (two) times daily.     CATAPRES 0.1 MG tablet  Generic drug:  cloNIDine  Take 0.1 mg by mouth 2 (two) times daily.     clonazePAM 0.5 MG tablet  Commonly known as:  KLONOPIN  Take 0.5 mg by mouth 2 (two) times daily as needed for anxiety. Takes one tablet  by mouth two times daily as needed for anxiety.     cyanocobalamin 500 MCG tablet  Take 1 tablet (500 mcg total) by mouth daily.     dexamethasone 4 MG tablet  Commonly known as:  DECADRON  Take 1 tablet (4 mg total) by mouth 3 (three) times daily.     docusate sodium 100 MG capsule  Commonly known as:  COLACE  Take 1 capsule (100 mg total) by mouth 2 (two) times daily.     famotidine 40 MG tablet  Commonly known as:   PEPCID  Take 1 tablet (40 mg total) by mouth 2 (two) times daily.     feeding supplement (ENSURE ENLIVE) Liqd  Take 237 mLs by mouth 2 (two) times daily between meals.     furosemide 40 MG tablet  Commonly known as:  LASIX  Take 1 tablet (40 mg total) by mouth 2 (two) times daily.     guaifenesin 400 MG Tabs tablet  Commonly known as:  HUMIBID E  Take 400 mg by mouth 2 (two) times daily.     HYDROcodone-acetaminophen 7.5-325 MG tablet  Commonly known as:  NORCO  Take 1 tablet by mouth every 6 (six) hours as needed for moderate pain.     ipratropium-albuterol 0.5-2.5 (3) MG/3ML Soln  Commonly known as:  DUONEB  Take 3 mLs by nebulization 4 (four) times daily.     isosorbide mononitrate 30 MG 24 hr tablet  Commonly known as:  IMDUR  Take 1 tablet (30 mg total) by mouth daily.     levETIRAcetam 750 MG tablet  Commonly known as:  KEPPRA  Take 2 tablets (1,500 mg total) by mouth 2 (two) times daily.     metoprolol 50 MG tablet  Commonly known as:  LOPRESSOR  Take 1 tablet (50 mg total) by mouth 2 (two) times daily.     omeprazole 40 MG capsule  Commonly known as:  PRILOSEC  Take 40 mg by mouth daily.     OXYGEN  Inhale 2-3 L into the lungs continuous.     pramipexole 1 MG tablet  Commonly known as:  MIRAPEX  Take 1 mg by mouth at bedtime.     senna 8.6 MG Tabs tablet  Commonly known as:  SENOKOT  Take 2 tablets (17.2 mg total) by mouth daily.     sodium chloride 0.65 % Soln nasal spray  Commonly known as:  OCEAN  Place 1 spray into both nostrils as needed for congestion.     sodium chloride 1 g tablet  Take 2 tablets (2 g total) by mouth 3 (three) times daily with meals.     tamsulosin 0.4 MG Caps capsule  Commonly known as:  FLOMAX  Take 1 capsule (0.4 mg total) by mouth daily.     triamcinolone cream 0.1 %  Commonly known as:  KENALOG  Apply 1 application topically 2 (two) times daily as needed (itching/ dry skin).     zolpidem 10 MG tablet  Commonly  known as:  AMBIEN  Take 10 mg by mouth at bedtime as needed for sleep. Reported on 09/25/2015           Follow-up Information    Follow up with [facility] .   Specialty:  [place] information:   [place] [phone]      Signed: Verdis Prime 01/16/2016, 3:14 PM

## 2016-01-17 ENCOUNTER — Emergency Department (HOSPITAL_COMMUNITY): Payer: Medicare Other

## 2016-01-17 ENCOUNTER — Encounter (HOSPITAL_COMMUNITY): Payer: Self-pay | Admitting: Emergency Medicine

## 2016-01-17 ENCOUNTER — Emergency Department (HOSPITAL_COMMUNITY)
Admission: EM | Admit: 2016-01-17 | Discharge: 2016-01-17 | Disposition: A | Discharge status: Home / Self Care | Payer: Medicare Other | Source: Home / Self Care | Attending: Emergency Medicine | Admitting: Emergency Medicine

## 2016-01-17 DIAGNOSIS — Z87891 Personal history of nicotine dependence: Secondary | ICD-10-CM

## 2016-01-17 DIAGNOSIS — J449 Chronic obstructive pulmonary disease, unspecified: Secondary | ICD-10-CM

## 2016-01-17 DIAGNOSIS — R0602 Shortness of breath: Secondary | ICD-10-CM

## 2016-01-17 DIAGNOSIS — Z85841 Personal history of malignant neoplasm of brain: Secondary | ICD-10-CM

## 2016-01-17 DIAGNOSIS — I10 Essential (primary) hypertension: Secondary | ICD-10-CM | POA: Insufficient documentation

## 2016-01-17 DIAGNOSIS — J440 Chronic obstructive pulmonary disease with acute lower respiratory infection: Secondary | ICD-10-CM | POA: Diagnosis not present

## 2016-01-17 DIAGNOSIS — J189 Pneumonia, unspecified organism: Secondary | ICD-10-CM | POA: Diagnosis not present

## 2016-01-17 DIAGNOSIS — Z7982 Long term (current) use of aspirin: Secondary | ICD-10-CM

## 2016-01-17 DIAGNOSIS — Z79899 Other long term (current) drug therapy: Secondary | ICD-10-CM | POA: Insufficient documentation

## 2016-01-17 LAB — CBC WITH DIFFERENTIAL/PLATELET
Basophils Absolute: 0 10*3/uL (ref 0.0–0.1)
Basophils Relative: 0 %
Eosinophils Absolute: 0 10*3/uL (ref 0.0–0.7)
Eosinophils Relative: 0 %
HCT: 33.1 % — ABNORMAL LOW (ref 39.0–52.0)
Hemoglobin: 10.7 g/dL — ABNORMAL LOW (ref 13.0–17.0)
Lymphocytes Relative: 23 %
Lymphs Abs: 1.6 10*3/uL (ref 0.7–4.0)
MCH: 30.3 pg (ref 26.0–34.0)
MCHC: 32.3 g/dL (ref 30.0–36.0)
MCV: 93.8 fL (ref 78.0–100.0)
Monocytes Absolute: 0.5 10*3/uL (ref 0.1–1.0)
Monocytes Relative: 7 %
Neutro Abs: 5 10*3/uL (ref 1.7–7.7)
Neutrophils Relative %: 70 %
Platelets: 117 10*3/uL — ABNORMAL LOW (ref 150–400)
RBC: 3.53 MIL/uL — ABNORMAL LOW (ref 4.22–5.81)
RDW: 15.4 % (ref 11.5–15.5)
WBC: 7.1 10*3/uL (ref 4.0–10.5)

## 2016-01-17 LAB — BASIC METABOLIC PANEL
Anion gap: 6 (ref 5–15)
BUN: 19 mg/dL (ref 6–20)
CO2: 33 mmol/L — ABNORMAL HIGH (ref 22–32)
Calcium: 8.7 mg/dL — ABNORMAL LOW (ref 8.9–10.3)
Chloride: 93 mmol/L — ABNORMAL LOW (ref 101–111)
Creatinine, Ser: 0.65 mg/dL (ref 0.61–1.24)
GFR calc Af Amer: >60 mL/min (ref >60)
GFR calc non Af Amer: >60 mL/min (ref >60)
Glucose, Bld: 85 mg/dL (ref 65–99)
Potassium: 3.8 mmol/L (ref 3.5–5.1)
Sodium: 132 mmol/L — ABNORMAL LOW (ref 135–145)

## 2016-01-17 LAB — BRAIN NATRIURETIC PEPTIDE: B Natriuretic Peptide: 150.3 pg/mL — ABNORMAL HIGH (ref 0.0–100.0)

## 2016-01-17 LAB — HEMOGLOBIN A1C
Hgb A1c MFr Bld: 5.7 % — ABNORMAL HIGH (ref 4.8–5.6)
Mean Plasma Glucose: 117 mg/dL

## 2016-01-17 LAB — LACTIC ACID, PLASMA: Lactic Acid, Venous: 1.2 mmol/L (ref 0.5–1.9)

## 2016-01-17 MED ORDER — IPRATROPIUM-ALBUTEROL 0.5-2.5 (3) MG/3ML IN SOLN
3.0000 mL | Freq: Once | RESPIRATORY_TRACT | Status: AC
  Administered 2016-01-17: 3 mL via RESPIRATORY_TRACT
  Filled 2016-01-17 (×2): qty 3

## 2016-01-17 MED ORDER — SODIUM CHLORIDE 0.9 % IV BOLUS (SEPSIS)
1000.0000 mL | Freq: Once | INTRAVENOUS | Status: AC
Start: 2016-01-17 — End: 2016-01-17
  Administered 2016-01-17: 1000 mL via INTRAVENOUS

## 2016-01-17 MED ORDER — HEPARIN SOD (PORK) LOCK FLUSH 100 UNIT/ML IV SOLN
500.0000 [IU] | Freq: Once | INTRAVENOUS | Status: AC
  Administered 2016-01-17: 500 [IU]
  Filled 2016-01-17: qty 5

## 2016-01-17 MED ORDER — IPRATROPIUM-ALBUTEROL 0.5-2.5 (3) MG/3ML IN SOLN
3.0000 mL | Freq: Once | RESPIRATORY_TRACT | Status: AC
  Administered 2016-01-17: 3 mL via RESPIRATORY_TRACT
  Filled 2016-01-17: qty 3

## 2016-01-17 NOTE — ED Provider Notes (Signed)
CSN: [id number]     Arrival date & time 01/17/16  1129 History   First MD Initiated Contact with Patient 01/17/16 1149     Chief Complaint  Patient presents with  . Shortness of Breath     (Consider location/radiation/quality/duration/timing/severity/associated sxs/prior Treatment) HPI Comments: Patient is a 75 year old male with recent history of brain tumor excision and COPD who was discharged from the hospital yesterday to [facility] rehabilitation who presents with shortness of breath. Patient states he normally has 4 DuoNeb treatments per day and he did not receive any at his rehabilitation facility yesterday. Patient states he does have shortness of breath and a cough at baseline. The family is also concerned about skin tears to the left elbow the patient had while in the hospital that continues to bleed. [facility] did not dress the wound adequately according to family. Patient takes aspirin. Of note, does not have much use to his left arm following surgery. The family states that they were told that swelling on the brain caused this. patient Patient denies any chest pain, fevers, abdominal pain, nausea, vomiting, dysuria. Patient is on 2L of oxygen at baseline.  Patient is a 75 y.o. male presenting with shortness of breath. The history is provided by the patient.  Shortness of Breath Associated symptoms: cough and wheezing   Associated symptoms: no abdominal pain, no chest pain, no fever, no headaches, no rash, no sore throat and no vomiting     Past Medical History  Diagnosis Date  . Arthritis   . Wheezing   . Sore throat   . Carotid artery occlusion   . COPD (chronic obstructive pulmonary disease) ([place])   . Lumbar disc disease   . Restless leg syndrome   . Allergic rhinitis   . Hypertension   . Anxiety     anxiety  . Shortness of breath   . Lung mass   . GERD (gastroesophageal reflux disease)   . H/O hiatal hernia   . Pre-operative cardiovascular examination   .  Nonspecific abnormal unspecified cardiovascular function study   . Peripheral vascular disease, unspecified ([place])   . Pneumonia 2014    ?   Marland Kitchen On home oxygen therapy     prn  . Constipation   . 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 S/P radiation therapy 12/04/14 srs    lt frontal brain  . S/P radiation therapy 07/18/15    SRS left frontal brain 20Gy  . Seizures ([place]) 12/13/15  . Cancer ([place]) 09/14/12    invasive mod diff squamous cell ca  . 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 History of chemotherapy   . Headache   . Occasional tremors     feet and legs    Past Surgical History  Procedure Laterality Date  . Carotid endarterectomy  10/15/2010    left  . Hand surgery      repair of the left long, ring, and small finger after a saw injury  . Video bronchoscopy  07/24/2012    Procedure: VIDEO BRONCHOSCOPY WITH FLUORO;  Surgeon: Kathee Delton, MD;  Location: [facility];  Service: Cardiopulmonary;  Laterality: Bilateral;  . Lobectomy Right 09/14/2012    Procedure: LOBECTOMY;  Surgeon: Ivin Poot, MD;  Location: [place];  Service: Thoracic;  Laterality: Right;  RIGHT UPPER LOBECTOMY  . Video assisted thoracoscopy Right 09/14/2012    Procedure: VIDEO ASSISTED THORACOSCOPY;  Surgeon: Ivin Poot, MD;  Location: [place];  Service: Thoracic;  Laterality: Right;  . Video bronchoscopy with endobronchial ultrasound N/A 08/27/2014    Procedure: VIDEO BRONCHOSCOPY WITH ENDOBRONCHIAL ULTRASOUND;  Surgeon: Ivin Poot, MD;  Location: [place];  Service: Thoracic;  Laterality: N/A;  . Scalene node biopsy Right 08/27/2014    Procedure: BIOPSY SCALENE NODE;  Surgeon: Ivin Poot, MD;  Location: [place];  Service: Thoracic;  Laterality: Right;  . Sterotactic radiosurgery Right 09/25/14    right parietal 30Gy/1 fx  . Power port a cath Right   . Colonoscopy    . Craniotomy N/A 01/06/2016    Procedure: CRANIOTOMY TUMOR EXCISION WITH CURVE ;  Surgeon: Kary Kos, MD;  Location: [facility] [facility];   Service: Neurosurgery;  Laterality: N/A;   Family History  Problem Relation Age of Onset  . Heart disease Father     MI  . Heart attack Father   . Alcohol abuse Mother   . Stroke Brother   . Heart disease Brother   . Depression Sister   . Heart attack Brother    Social History  Substance Use Topics  . Smoking status: Former Smoker -- 1.00 packs/day for 55 years    Types: Cigarettes    Quit date: 08/16/2014  . Smokeless tobacco: Former Systems developer    Quit date: 04/19/1952  . Alcohol Use: Yes     Comment: occianional- not since I ve sick.    Review of Systems  Constitutional: Negative for fever and chills.  HENT: Negative for facial swelling and sore throat.   Respiratory: Positive for cough, shortness of breath and wheezing.   Cardiovascular: Negative for chest pain.  Gastrointestinal: Negative for nausea, vomiting and abdominal pain.  Genitourinary: Negative for dysuria.  Musculoskeletal: Negative for back pain.  Skin: Negative for rash and wound.  Neurological: Negative for headaches.  Psychiatric/Behavioral: The patient is not nervous/anxious.       Allergies  Review of patient's allergies indicates no known allergies.  Home Medications   Prior to Admission medications   Medication Sig Start Date End Date Taking? Authorizing Provider  amLODipine (NORVASC) 5 MG tablet Take 5 mg by mouth daily.   Yes Historical Provider, MD  aspirin EC 81 MG tablet Take 81 mg by mouth daily. Reported on 12/22/2015   Yes Historical Provider, MD  budesonide (PULMICORT) 0.25 MG/2ML nebulizer solution Take 2 mLs (0.25 mg total) by nebulization 2 (two) times daily. 01/16/16  Yes Orson Eva, MD  clonazePAM (KLONOPIN) 0.5 MG tablet Take 0.5 mg by mouth 2 (two) times daily as needed for anxiety. Takes one tablet by mouth two times daily as needed for anxiety.   Yes Historical Provider, MD  cloNIDine (CATAPRES) 0.1 MG tablet Take 0.1 mg by mouth 2 (two) times daily.   Yes Historical Provider, MD   dexamethasone (DECADRON) 4 MG tablet Take 1 tablet (4 mg total) by mouth 4 (four) times daily. 01/16/16  Yes Erline Levine, MD  docusate sodium (COLACE) 100 MG capsule Take 1 capsule (100 mg total) by mouth 2 (two) times daily. 01/16/16  Yes Orson Eva, MD  famotidine (PEPCID) 40 MG tablet Take 1 tablet (40 mg total) by mouth 2 (two) times daily. 01/16/16  Yes Orson Eva, MD  feeding supplement, ENSURE ENLIVE, (ENSURE ENLIVE) LIQD Take 237 mLs by mouth 2 (two) times daily between meals. 01/16/16  Yes Orson Eva, MD  furosemide (LASIX) 40 MG tablet Take 1 tablet (40 mg total) by mouth 2 (two) times daily. 01/02/16  Yes Noralee Space,  MD  guaifenesin (HUMIBID E) 400 MG TABS tablet Take 400 mg by mouth 2 (two) times daily.   Yes Historical Provider, MD  HYDROcodone-acetaminophen (NORCO) 7.5-325 MG tablet Take 1 tablet by mouth every 6 (six) hours as needed for moderate pain. 09/25/15  Yes Heather F Boelter, NP  ipratropium-albuterol (DUONEB) 0.5-2.5 (3) MG/3ML SOLN Take 3 mLs by nebulization 4 (four) times daily. Patient taking differently: Take 3 mLs by nebulization 4 (four) times daily. Pt takes nebulizer 2 to 3 times daily. 12/18/14  Yes Noralee Space, MD  isosorbide mononitrate (IMDUR) 30 MG 24 hr tablet Take 1 tablet (30 mg total) by mouth daily. 08/12/15  Yes Jettie Booze, MD  levETIRAcetam (KEPPRA) 750 MG tablet Take 2 tablets (1,500 mg total) by mouth 2 (two) times daily. 12/24/15  Yes Penni Bombard, MD  metoprolol (LOPRESSOR) 50 MG tablet Take 1 tablet (50 mg total) by mouth 2 (two) times daily. 01/16/16  Yes Orson Eva, MD  OXYGEN Inhale 2-3 L into the lungs continuous.   Yes Historical Provider, MD  pramipexole (MIRAPEX) 1 MG tablet Take 1 mg by mouth at bedtime.   Yes Historical Provider, MD  senna (SENOKOT) 8.6 MG TABS tablet Take 2 tablets (17.2 mg total) by mouth daily. 01/16/16  Yes Orson Eva, MD  sodium chloride (OCEAN) 0.65 % SOLN nasal spray Place 1 spray into both nostrils as needed for  congestion.   Yes Historical Provider, MD  sodium chloride 1 g tablet Take 2 tablets (2 g total) by mouth 3 (three) times daily with meals. 01/16/16  Yes Orson Eva, MD  tamsulosin (FLOMAX) 0.4 MG CAPS capsule Take 1 capsule (0.4 mg total) by mouth daily. 01/16/16  Yes Orson Eva, MD  triamcinolone cream (KENALOG) 0.1 % Apply 1 application topically 2 (two) times daily as needed (itching/ dry skin).  10/30/15  Yes Historical Provider, MD  vitamin B-12 500 MCG tablet Take 1 tablet (500 mcg total) by mouth daily. 01/16/16  Yes Orson Eva, MD  zolpidem (AMBIEN) 10 MG tablet Take 10 mg by mouth at bedtime as needed for sleep. Reported on 09/25/2015   Yes Historical Provider, MD   BP 143/93 mmHg  Pulse 102  Temp(Src) 98.8 F (37.1 C) (Oral)  Resp 23  Ht 5' 9.5" (1.765 m)  Wt 81.647 kg  BMI 26.21 kg/m2  SpO2 95% Physical Exam  Constitutional: He appears well-developed and well-nourished. No distress.  HENT:  Head: Normocephalic and atraumatic.  Mouth/Throat: Oropharynx is clear and moist. No oropharyngeal exudate.  Eyes: Conjunctivae are normal. Pupils are equal, round, and reactive to light. Right eye exhibits no discharge. Left eye exhibits no discharge. No scleral icterus.  Neck: Normal range of motion. Neck supple. No thyromegaly present.  Cardiovascular: Normal rate, regular rhythm, normal heart sounds and intact distal pulses.  Exam reveals no gallop and no friction rub.   No murmur heard. Pulmonary/Chest: Effort normal. No stridor. No respiratory distress. He has wheezes (diffuse, expiratory). He has rhonchi (R lung).  Pursed lip breathing  Abdominal: Soft. Bowel sounds are normal. He exhibits no distension. There is no tenderness. There is no rebound and no guarding.  Musculoskeletal: He exhibits no edema.  Lymphadenopathy:    He has no cervical adenopathy.  Neurological: He is alert. Coordination normal.  Skin: Skin is warm and dry. No rash noted. He is not diaphoretic. No pallor.  2 skin  tears ~3 cm to left elbow; clots present; no other drainage, surrounding edema; some surrounding  ecchymosis  Psychiatric: He has a normal mood and affect.  Nursing note and vitals reviewed.   ED Course  Procedures (including critical care time) Labs Review Labs Reviewed  BASIC METABOLIC PANEL - Abnormal; Notable for the following:    Sodium 132 (*)    Chloride 93 (*)    CO2 33 (*)    Calcium 8.7 (*)    All other components within normal limits  CBC WITH DIFFERENTIAL/PLATELET - Abnormal; Notable for the following:    RBC 3.53 (*)    Hemoglobin 10.7 (*)    HCT 33.1 (*)    Platelets 117 (*)    All other components within normal limits  BRAIN NATRIURETIC PEPTIDE - Abnormal; Notable for the following:    B Natriuretic Peptide 150.3 (*)    All other components within normal limits  LACTIC ACID, PLASMA  LACTIC ACID, PLASMA    Imaging Review Dg Chest 2 View  01/17/2016  CLINICAL DATA:  Short of breath and cough EXAM: CHEST  2 VIEW COMPARISON:  01/15/2015 FINDINGS: Stable right jugular Port-A-Cath. Chronic pleural changes at the right lung base. Left lung is hyperaerated and clear. Postop changes in the right hilum. No pneumothorax. Right apical pleural thickening is stable. IMPRESSION: No active cardiopulmonary disease. Electronically Signed   By: Marybelle Killings M.D.   On: 01/17/2016 13:16   I have personally reviewed and evaluated these images and lab results as part of my medical decision-making.   EKG Interpretation None      MDM   CBC shows hemoglobin 10.7, which is improved from 2 days ago at patient discharge. BNP 150.3, which is improved from last trial. BMP shows sodium 132, chloride 93, CO2 33, calcium 8.7. The level seemed to be stable and patient discharged yesterday. Lactate 1.2. CXR shows no active cardiopulmonary disease. Patient shortness of breath improved and breathing at baseline after 2 DuoNeb treatments any. Dr. Stark Jock and I suspect patient's dyspnea caused by patient  not receiving his prescribed DuoNeb treatments at new rehabilitation facility last evening. I discussed with patient and family the importance of his receiving his breathing treatments and other maintenance medications. Wound care provided for skin tear to left elbow. No signs of infection noted.Patient vitals stable throughout ED course and discharged in satisfactory condition. O2 sats greater than 98% throughout ED course on patient's baseline 2 L.  Final diagnoses:  Shortness of breath       Frederica Kuster, PA-C 01/17/16 1731  Veryl Speak, MD 01/18/16 [id number]

## 2016-01-17 NOTE — ED Notes (Addendum)
Ems states pt was d/c from [name] yesterday to [facility] [place] [place]. Pt had a brain tumor removed in the last couple of weeks. pT C/O SOB  Today. Pt states he hasn't had his breathing tx.  Ems gave pt treatment in route here.

## 2016-01-17 NOTE — Discharge Instructions (Signed)
Continue taking previously prescribed medications including for DuoNeb breathing treatments daily, Mucinex, steroids and use prescribed oxygen at 2 L with humidified air. Please return to the emergency department if you develop any new or worsening symptoms.   Shortness of Breath Shortness of breath means you have trouble breathing. It could also mean that you have a medical problem. You should get immediate medical care for shortness of breath. CAUSES   Not enough oxygen in the air such as with high altitudes or a smoke-filled room.  Certain lung diseases, infections, or problems.  Heart disease or conditions, such as angina or heart failure.  Low red blood cells (anemia).  Poor physical fitness, which can cause shortness of breath when you exercise.  Chest or back injuries or stiffness.  Being overweight.  Smoking.  Anxiety, which can make you feel like you are not getting enough air. DIAGNOSIS  Serious medical problems can often be found during your physical exam. Tests may also be done to determine why you are having shortness of breath. Tests may include:  Chest X-rays.  Lung function tests.  Blood tests.  An electrocardiogram (ECG).  An ambulatory electrocardiogram. An ambulatory ECG records your heartbeat patterns over a 24-hour period.  Exercise testing.  A transthoracic echocardiogram (TTE). During echocardiography, sound waves are used to evaluate how blood flows through your heart.  A transesophageal echocardiogram (TEE).  Imaging scans. Your health care provider may not be able to find a cause for your shortness of breath after your exam. In this case, it is important to have a follow-up exam with your health care provider as directed.  TREATMENT  Treatment for shortness of breath depends on the cause of your symptoms and can vary greatly. HOME CARE INSTRUCTIONS   Do not smoke. Smoking is a common cause of shortness of breath. If you smoke, ask for help to  quit.  Avoid being around chemicals or things that may bother your breathing, such as paint fumes and dust.  Rest as needed. Slowly resume your usual activities.  If medicines were prescribed, take them as directed for the full length of time directed. This includes oxygen and any inhaled medicines.  Keep all follow-up appointments as directed by your health care provider. SEEK MEDICAL CARE IF:   Your condition does not improve in the time expected.  You have a hard time doing your normal activities even with rest.  You have any new symptoms. SEEK IMMEDIATE MEDICAL CARE IF:   Your shortness of breath gets worse.  You feel light-headed, faint, or develop a cough not controlled with medicines.  You start coughing up blood.  You have pain with breathing.  You have chest pain or pain in your arms, shoulders, or abdomen.  You have a fever.  You are unable to walk up stairs or exercise the way you normally do. MAKE SURE YOU:  Understand these instructions.  Will watch your condition.  Will get help right away if you are not doing well or get worse.   This information is not intended to replace advice given to you by your health care provider. Make sure you discuss any questions you have with your health care provider.   Document Released: 03/23/2001 Document Revised: 07/03/2013 Document Reviewed: 09/13/2011 Elsevier Interactive Patient Education [employer].

## 2016-01-19 ENCOUNTER — Inpatient Hospital Stay (HOSPITAL_COMMUNITY)
Admission: EM | Admit: 2016-01-19 | Discharge: 2016-01-28 | DRG: 190 | Disposition: A | Discharge status: Skilled Nursing Facility | Payer: Medicare Other | Attending: Internal Medicine | Admitting: Internal Medicine

## 2016-01-19 ENCOUNTER — Encounter (HOSPITAL_COMMUNITY): Payer: Self-pay | Admitting: Emergency Medicine

## 2016-01-19 DIAGNOSIS — J9600 Acute respiratory failure, unspecified whether with hypoxia or hypercapnia: Secondary | ICD-10-CM

## 2016-01-19 DIAGNOSIS — E440 Moderate protein-calorie malnutrition: Secondary | ICD-10-CM | POA: Diagnosis present

## 2016-01-19 DIAGNOSIS — Z87891 Personal history of nicotine dependence: Secondary | ICD-10-CM

## 2016-01-19 DIAGNOSIS — J418 Mixed simple and mucopurulent chronic bronchitis: Secondary | ICD-10-CM

## 2016-01-19 DIAGNOSIS — R739 Hyperglycemia, unspecified: Secondary | ICD-10-CM | POA: Diagnosis present

## 2016-01-19 DIAGNOSIS — I110 Hypertensive heart disease with heart failure: Secondary | ICD-10-CM | POA: Diagnosis present

## 2016-01-19 DIAGNOSIS — G2581 Restless legs syndrome: Secondary | ICD-10-CM | POA: Diagnosis present

## 2016-01-19 DIAGNOSIS — J440 Chronic obstructive pulmonary disease with acute lower respiratory infection: Principal | ICD-10-CM | POA: Diagnosis present

## 2016-01-19 DIAGNOSIS — Z7982 Long term (current) use of aspirin: Secondary | ICD-10-CM

## 2016-01-19 DIAGNOSIS — M519 Unspecified thoracic, thoracolumbar and lumbosacral intervertebral disc disorder: Secondary | ICD-10-CM | POA: Diagnosis present

## 2016-01-19 DIAGNOSIS — Y95 Nosocomial condition: Secondary | ICD-10-CM | POA: Diagnosis present

## 2016-01-19 DIAGNOSIS — C779 Secondary and unspecified malignant neoplasm of lymph node, unspecified: Secondary | ICD-10-CM | POA: Diagnosis present

## 2016-01-19 DIAGNOSIS — D638 Anemia in other chronic diseases classified elsewhere: Secondary | ICD-10-CM | POA: Diagnosis present

## 2016-01-19 DIAGNOSIS — C3490 Malignant neoplasm of unspecified part of unspecified bronchus or lung: Secondary | ICD-10-CM | POA: Diagnosis present

## 2016-01-19 DIAGNOSIS — Z9981 Dependence on supplemental oxygen: Secondary | ICD-10-CM

## 2016-01-19 DIAGNOSIS — C7931 Secondary malignant neoplasm of brain: Secondary | ICD-10-CM | POA: Diagnosis present

## 2016-01-19 DIAGNOSIS — E876 Hypokalemia: Secondary | ICD-10-CM | POA: Diagnosis present

## 2016-01-19 DIAGNOSIS — Z8249 Family history of ischemic heart disease and other diseases of the circulatory system: Secondary | ICD-10-CM

## 2016-01-19 DIAGNOSIS — D6489 Other specified anemias: Secondary | ICD-10-CM | POA: Diagnosis present

## 2016-01-19 DIAGNOSIS — I10 Essential (primary) hypertension: Secondary | ICD-10-CM | POA: Diagnosis present

## 2016-01-19 DIAGNOSIS — R0602 Shortness of breath: Secondary | ICD-10-CM

## 2016-01-19 DIAGNOSIS — D696 Thrombocytopenia, unspecified: Secondary | ICD-10-CM | POA: Diagnosis present

## 2016-01-19 DIAGNOSIS — G40909 Epilepsy, unspecified, not intractable, without status epilepticus: Secondary | ICD-10-CM | POA: Diagnosis present

## 2016-01-19 DIAGNOSIS — J9620 Acute and chronic respiratory failure, unspecified whether with hypoxia or hypercapnia: Secondary | ICD-10-CM | POA: Diagnosis present

## 2016-01-19 DIAGNOSIS — E871 Hypo-osmolality and hyponatremia: Secondary | ICD-10-CM | POA: Diagnosis present

## 2016-01-19 DIAGNOSIS — M5136 Other intervertebral disc degeneration, lumbar region: Secondary | ICD-10-CM | POA: Diagnosis present

## 2016-01-19 DIAGNOSIS — F419 Anxiety disorder, unspecified: Secondary | ICD-10-CM | POA: Diagnosis present

## 2016-01-19 DIAGNOSIS — I959 Hypotension, unspecified: Secondary | ICD-10-CM | POA: Diagnosis present

## 2016-01-19 DIAGNOSIS — I6200 Nontraumatic subdural hemorrhage, unspecified: Secondary | ICD-10-CM | POA: Diagnosis present

## 2016-01-19 DIAGNOSIS — I169 Hypertensive crisis, unspecified: Secondary | ICD-10-CM | POA: Diagnosis present

## 2016-01-19 DIAGNOSIS — J189 Pneumonia, unspecified organism: Secondary | ICD-10-CM | POA: Diagnosis present

## 2016-01-19 DIAGNOSIS — F329 Major depressive disorder, single episode, unspecified: Secondary | ICD-10-CM | POA: Diagnosis present

## 2016-01-19 DIAGNOSIS — I5032 Chronic diastolic (congestive) heart failure: Secondary | ICD-10-CM | POA: Diagnosis present

## 2016-01-19 DIAGNOSIS — Z85118 Personal history of other malignant neoplasm of bronchus and lung: Secondary | ICD-10-CM

## 2016-01-19 DIAGNOSIS — D649 Anemia, unspecified: Secondary | ICD-10-CM | POA: Diagnosis present

## 2016-01-19 DIAGNOSIS — M1990 Unspecified osteoarthritis, unspecified site: Secondary | ICD-10-CM | POA: Diagnosis present

## 2016-01-19 DIAGNOSIS — I2510 Atherosclerotic heart disease of native coronary artery without angina pectoris: Secondary | ICD-10-CM | POA: Diagnosis present

## 2016-01-19 DIAGNOSIS — Z79899 Other long term (current) drug therapy: Secondary | ICD-10-CM

## 2016-01-19 DIAGNOSIS — K219 Gastro-esophageal reflux disease without esophagitis: Secondary | ICD-10-CM | POA: Diagnosis present

## 2016-01-19 DIAGNOSIS — J9621 Acute and chronic respiratory failure with hypoxia: Secondary | ICD-10-CM | POA: Diagnosis present

## 2016-01-19 DIAGNOSIS — Z515 Encounter for palliative care: Secondary | ICD-10-CM | POA: Diagnosis present

## 2016-01-19 DIAGNOSIS — Z6823 Body mass index (BMI) 23.0-23.9, adult: Secondary | ICD-10-CM

## 2016-01-19 DIAGNOSIS — J441 Chronic obstructive pulmonary disease with (acute) exacerbation: Secondary | ICD-10-CM | POA: Diagnosis present

## 2016-01-19 DIAGNOSIS — S51812A Laceration without foreign body of left forearm, initial encounter: Secondary | ICD-10-CM | POA: Diagnosis present

## 2016-01-19 DIAGNOSIS — G8194 Hemiplegia, unspecified affecting left nondominant side: Secondary | ICD-10-CM | POA: Diagnosis present

## 2016-01-19 DIAGNOSIS — Z8673 Personal history of transient ischemic attack (TIA), and cerebral infarction without residual deficits: Secondary | ICD-10-CM

## 2016-01-19 DIAGNOSIS — I739 Peripheral vascular disease, unspecified: Secondary | ICD-10-CM | POA: Diagnosis present

## 2016-01-19 DIAGNOSIS — N400 Enlarged prostate without lower urinary tract symptoms: Secondary | ICD-10-CM | POA: Diagnosis present

## 2016-01-19 DIAGNOSIS — A419 Sepsis, unspecified organism: Secondary | ICD-10-CM | POA: Diagnosis present

## 2016-01-19 HISTORY — DX: Benign prostatic hyperplasia without lower urinary tract symptoms: N40.0

## 2016-01-19 LAB — GLUCOSE, CAPILLARY: GLUCOSE-CAPILLARY: 145 mg/dL — AB (ref 65–99)

## 2016-01-19 MED ORDER — IPRATROPIUM-ALBUTEROL 0.5-2.5 (3) MG/3ML IN SOLN
3.0000 mL | RESPIRATORY_TRACT | Status: DC
  Administered 2016-01-20 (×2): 3 mL via RESPIRATORY_TRACT
  Filled 2016-01-19 (×2): qty 3

## 2016-01-19 NOTE — ED Notes (Signed)
Patient from [facility] in [place].  Patient had recent brain surgery, having pain at surgical site.  Patient had brain CA.  Patient has had a stroke 3 years ago.  The stroke affected left side.  CAOx4, able to speak his needs.  No areas of redness or drainage at surgical site.  Patient does have a cough, rhonchii in all fields.

## 2016-01-19 NOTE — ED Notes (Signed)
Monique - RN and Dr. Claudine Mouton aware of pt's BP.

## 2016-01-19 NOTE — ED Notes (Signed)
MD at bedside. 

## 2016-01-19 NOTE — ED Provider Notes (Signed)
CSN: [id number]     Arrival date & time 01/19/16  2303 History  By signing my name below, I, [name], attest that this documentation has been prepared under the direction and in the presence of Darren Balls, MD. Electronically Signed: Altamease Rice, ED Scribe. 01/19/2016. 11:55 PM   Chief Complaint  Patient presents with  . Head Injury    The history is provided by the patient and the spouse. No language interpreter was used.   [name] is a 75 y.o. male with PMHx of right parietal occipital metastatic lung cancer s/p recent right parietal occipital craniectomy for resection of occipital met and COPD who presents to the Emergency Department complaining of 4/10 in severity, pulling pain at the staples at the incision site of a craniotomy performed 13 days ago. [name] wife states that he has been complaining of pain at the staples today despite being on hydrocodone for pain. The staples are supposed to be removed tomorrow. The patient has not fallen recently or sustained any trauma to the head. She states that he has felt warm to the touch all day but has not had a measured fever. She is not aware of any Tylenol or Motrin that he has been given. Pt has also been complaining of increased SOB this morning and an episode of emesis. He is on oxygen at his facility. Pt denies increased cough and his wife notes that the swelling in his legs has improved.   Past Medical History  Diagnosis Date  . Arthritis   . Wheezing   . Sore throat   . Carotid artery occlusion   . COPD (chronic obstructive pulmonary disease) ([place])   . Lumbar disc disease   . Restless leg syndrome   . Allergic rhinitis   . Hypertension   . Anxiety     anxiety  . Shortness of breath   . Lung mass   . GERD (gastroesophageal reflux disease)   . H/O hiatal hernia   . Pre-operative cardiovascular examination   . Nonspecific abnormal unspecified cardiovascular function study   . Peripheral vascular disease,  unspecified ([place])   . Pneumonia 2014    ?   Marland Kitchen On home oxygen therapy     prn  . Constipation   . 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 S/P radiation therapy 12/04/14 srs    lt frontal brain  . S/P radiation therapy 07/18/15    SRS left frontal brain 20Gy  . Seizures ([place]) 12/13/15  . Cancer ([place]) 09/14/12    invasive mod diff squamous cell ca  . 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 History of chemotherapy   . Headache   . Occasional tremors     feet and legs    Past Surgical History  Procedure Laterality Date  . Carotid endarterectomy  10/15/2010    left  . Hand surgery      repair of the left long, ring, and small finger after a saw injury  . Video bronchoscopy  07/24/2012    Procedure: VIDEO BRONCHOSCOPY WITH FLUORO;  Surgeon: Kathee Delton, MD;  Location: [facility];  Service: Cardiopulmonary;  Laterality: Bilateral;  . Lobectomy Right 09/14/2012    Procedure: LOBECTOMY;  Surgeon: Ivin Poot, MD;  Location: [facility];  Service: Thoracic;  Laterality: Right;  RIGHT UPPER LOBECTOMY  . Video assisted thoracoscopy Right 09/14/2012    Procedure: VIDEO ASSISTED THORACOSCOPY;  Surgeon: Ivin Poot, MD;  Location: [facility]  OR;  Service: Thoracic;  Laterality: Right;  . Video bronchoscopy with endobronchial ultrasound N/A 08/27/2014    Procedure: VIDEO BRONCHOSCOPY WITH ENDOBRONCHIAL ULTRASOUND;  Surgeon: Ivin Poot, MD;  Location: [facility];  Service: Thoracic;  Laterality: N/A;  . Scalene node biopsy Right 08/27/2014    Procedure: BIOPSY SCALENE NODE;  Surgeon: Ivin Poot, MD;  Location: [facility];  Service: Thoracic;  Laterality: Right;  . Sterotactic radiosurgery Right 09/25/14    right parietal 30Gy/1 fx  . Power port a cath Right   . Colonoscopy    . Craniotomy N/A 01/06/2016    Procedure: CRANIOTOMY TUMOR EXCISION WITH CURVE ;  Surgeon: Kary Kos, MD;  Location: [facility];  Service: Neurosurgery;  Laterality: N/A;   Family History  Problem Relation Age of Onset  .  Heart disease Father     MI  . Heart attack Father   . Alcohol abuse Mother   . Stroke Brother   . Heart disease Brother   . Depression Sister   . Heart attack Brother    Social History  Substance Use Topics  . Smoking status: Former Smoker -- 1.00 packs/day for 55 years    Types: Cigarettes    Quit date: 08/16/2014  . Smokeless tobacco: Former Systems developer    Quit date: 04/19/1952  . Alcohol Use: Yes     Comment: occianional- not since I ve sick.    Review of Systems  10 Systems reviewed and all are negative for acute change except as noted in the HPI.  Allergies  Review of patient's allergies indicates no known allergies.  Home Medications   Prior to Admission medications   Medication Sig Start Date End Date Taking? Authorizing Provider  amLODipine (NORVASC) 5 MG tablet Take 5 mg by mouth daily.    Historical Provider, MD  aspirin EC 81 MG tablet Take 81 mg by mouth daily. Reported on 12/22/2015    Historical Provider, MD  budesonide (PULMICORT) 0.25 MG/2ML nebulizer solution Take 2 mLs (0.25 mg total) by nebulization 2 (two) times daily. 01/16/16   Orson Eva, MD  clonazePAM (KLONOPIN) 0.5 MG tablet Take 0.5 mg by mouth 2 (two) times daily as needed for anxiety. Takes one tablet by mouth two times daily as needed for anxiety.    Historical Provider, MD  cloNIDine (CATAPRES) 0.1 MG tablet Take 0.1 mg by mouth 2 (two) times daily.    Historical Provider, MD  dexamethasone (DECADRON) 4 MG tablet Take 1 tablet (4 mg total) by mouth 4 (four) times daily. 01/16/16   Erline Levine, MD  docusate sodium (COLACE) 100 MG capsule Take 1 capsule (100 mg total) by mouth 2 (two) times daily. 01/16/16   Orson Eva, MD  famotidine (PEPCID) 40 MG tablet Take 1 tablet (40 mg total) by mouth 2 (two) times daily. 01/16/16   Orson Eva, MD  feeding supplement, ENSURE ENLIVE, (ENSURE ENLIVE) LIQD Take 237 mLs by mouth 2 (two) times daily between meals. 01/16/16   Orson Eva, MD  furosemide (LASIX) 40 MG tablet Take 1  tablet (40 mg total) by mouth 2 (two) times daily. 01/02/16   Noralee Space, MD  guaifenesin (HUMIBID E) 400 MG TABS tablet Take 400 mg by mouth 2 (two) times daily.    Historical Provider, MD  HYDROcodone-acetaminophen (NORCO) 7.5-325 MG tablet Take 1 tablet by mouth every 6 (six) hours as needed for moderate pain. 09/25/15   Laurie Panda, NP  ipratropium-albuterol (DUONEB) 0.5-2.5 (3) MG/3ML SOLN Take  3 mLs by nebulization 4 (four) times daily. Patient taking differently: Take 3 mLs by nebulization 4 (four) times daily. Pt takes nebulizer 2 to 3 times daily. 12/18/14   Noralee Space, MD  isosorbide mononitrate (IMDUR) 30 MG 24 hr tablet Take 1 tablet (30 mg total) by mouth daily. 08/12/15   Jettie Booze, MD  levETIRAcetam (KEPPRA) 750 MG tablet Take 2 tablets (1,500 mg total) by mouth 2 (two) times daily. 12/24/15   Penni Bombard, MD  metoprolol (LOPRESSOR) 50 MG tablet Take 1 tablet (50 mg total) by mouth 2 (two) times daily. 01/16/16   Orson Eva, MD  OXYGEN Inhale 2-3 L into the lungs continuous.    Historical Provider, MD  pramipexole (MIRAPEX) 1 MG tablet Take 1 mg by mouth at bedtime.    Historical Provider, MD  senna (SENOKOT) 8.6 MG TABS tablet Take 2 tablets (17.2 mg total) by mouth daily. 01/16/16   Orson Eva, MD  sodium chloride (OCEAN) 0.65 % SOLN nasal spray Place 1 spray into both nostrils as needed for congestion.    Historical Provider, MD  sodium chloride 1 g tablet Take 2 tablets (2 g total) by mouth 3 (three) times daily with meals. 01/16/16   Orson Eva, MD  tamsulosin (FLOMAX) 0.4 MG CAPS capsule Take 1 capsule (0.4 mg total) by mouth daily. 01/16/16   Orson Eva, MD  triamcinolone cream (KENALOG) 0.1 % Apply 1 application topically 2 (two) times daily as needed (itching/ dry skin).  10/30/15   Historical Provider, MD  vitamin B-12 500 MCG tablet Take 1 tablet (500 mcg total) by mouth daily. 01/16/16   Orson Eva, MD  zolpidem (AMBIEN) 10 MG tablet Take 10 mg by mouth at bedtime as  needed for sleep. Reported on 09/25/2015    Historical Provider, MD   BP 87/59 mmHg  Pulse 69  Temp(Src) 98.7 F (37.1 C) (Oral)  Resp 18  SpO2 100% Physical Exam  Constitutional: He is oriented to person, place, and time. Vital signs are normal. He appears well-developed and well-nourished.  Non-toxic appearance. He does not appear ill. No distress.  HENT:  Head: Normocephalic and atraumatic.  Nose: Nose normal.  Mouth/Throat: Oropharynx is clear and moist. No oropharyngeal exudate.  S/p craniotomy with multiple staples in place, woind CDI with no signs of infection.   Eyes: Conjunctivae and EOM are normal. Pupils are equal, round, and reactive to light. No scleral icterus.  Neck: Normal range of motion. Neck supple. No tracheal deviation, no edema, no erythema and normal range of motion present. No thyroid mass and no thyromegaly present.  Cardiovascular: Normal rate, regular rhythm, S1 normal, S2 normal, normal heart sounds, intact distal pulses and normal pulses.  Exam reveals no gallop and no friction rub.   No murmur heard. Pulmonary/Chest: Effort normal. No respiratory distress. He has no wheezes. He has rhonchi. He has no rales.  Port in right chest wall Rhonchi in right side only   Abdominal: Soft. Normal appearance and bowel sounds are normal. He exhibits distension. He exhibits no ascites and no mass. There is no hepatosplenomegaly. There is no tenderness. There is no rebound, no guarding and no CVA tenderness.  Musculoskeletal: Normal range of motion. He exhibits no edema or tenderness.  Isolated LLE edema   Lymphadenopathy:    He has no cervical adenopathy.  Neurological: He is alert and oriented to person, place, and time. He has normal strength. No cranial nerve deficit or sensory deficit.  Skin: Skin  is warm, dry and intact. No petechiae and no rash noted. He is not diaphoretic. No erythema. No pallor.  Psychiatric: He has a normal mood and affect. His behavior is normal.  Judgment normal.  Nursing note and vitals reviewed.   ED Course  Procedures (including critical care time) DIAGNOSTIC STUDIES: Oxygen Saturation is 100% on 3L, adequate by my interpretation.    COORDINATION OF CARE: 11:45 PM Discussed treatment plan which includes lab work, EKG, CT head without contrast, CT chest without contrast, and a breathing treatment with the pt and his wife at bedside and they agreed to plan.  Labs Review Labs Reviewed  COMPREHENSIVE METABOLIC PANEL - Abnormal; Notable for the following:    Sodium 133 (*)    Potassium 3.4 (*)    Chloride 97 (*)    Glucose, Bld 179 (*)    Calcium 7.9 (*)    Total Protein 4.4 (*)    Albumin 2.5 (*)    All other components within normal limits  CBC WITH DIFFERENTIAL/PLATELET - Abnormal; Notable for the following:    RBC 2.76 (*)    Hemoglobin 8.4 (*)    HCT 26.0 (*)    Platelets 115 (*)    All other components within normal limits  URINALYSIS, ROUTINE W REFLEX MICROSCOPIC (NOT AT [facility]) - Abnormal; Notable for the following:    APPearance CLOUDY (*)    All other components within normal limits  I-STAT CG4 LACTIC ACID, ED - Abnormal; Notable for the following:    Lactic Acid, Venous 2.18 (*)    All other components within normal limits  CULTURE, BLOOD (ROUTINE X 2)  CULTURE, BLOOD (ROUTINE X 2)  URINE CULTURE  POC OCCULT BLOOD, ED  I-STAT CG4 LACTIC ACID, ED    Imaging Review Ct Head Wo Contrast  01/20/2016  CLINICAL DATA:  Recent brain surgery. Pain near the surgery site. History of metastasis to the right parietal and occipital region from lung cancer. EXAM: CT HEAD WITHOUT CONTRAST TECHNIQUE: Contiguous axial images were obtained from the base of the skull through the vertex without intravenous contrast. COMPARISON:  01/14/2016 FINDINGS: Postoperative changes with right posterior parietal craniotomy. Plate and screw fixation of the bone flap. Skin clips consistent with recent surgery. Small subcutaneous scalp hematoma  over the surgical region. There is an intraparenchymal hematoma in the right posterior parietal region measuring about 3.1 x 5 cm diameter. There is surrounding vasogenic edema. The hematoma is decreasing in density consistent with typical evolution since previous study. Small subdural hematoma is demonstrated under the bone flap. These all likely represent postoperative changes. There is no significant progression since previous study. No new hemorrhagic foci. No midline shift. There is residual sulcal effacement. Underlying diffuse cerebral atrophy. Ventricular dilatation consistent with central atrophy. Basal cisterns are not effaced. Mucosal thickening in the paranasal sinuses. Partial opacification of left mastoid air cells. Vascular calcifications. IMPRESSION: Postoperative changes in the right posterior parietal region with right posterior parietal intraparenchymal and subdural hematomas demonstrating typical evolution ule changes since previous study. No evidence of progression. No developing mass effect. Electronically Signed   By: Lucienne Capers M.D.   On: 01/20/2016 01:12   Ct Chest Wo Contrast  01/20/2016  CLINICAL DATA:  75 year old male with fever and shortness of breath. History of metastatic lung cancer status post prior VATS procedure. EXAM: CT CHEST WITHOUT CONTRAST TECHNIQUE: Multidetector CT imaging of the chest was performed following the standard protocol without IV contrast. COMPARISON:  Chest radiograph dated 01/17/2016 and chest  CT dated 12/29/2015 FINDINGS: Evaluation of this exam is limited in the absence of intravenous contrast. There is a small right pleural effusion which appears similar in size to the prior study. Trace left pleural effusions also noted, stable. There is mild centrilobular emphysema. Postsurgical changes of right upper lobectomy with areas of scarring noted. There is a cluster of nodular density in the right middle lobe. A cluster of nodularity is also noted in  the lingula, new from prior study most compatible with developing pneumonia. Metastatic disease is less likely given these nodules were not present on the recent study of 12/29/2015. Clinical correlation and follow-up recommended. There is no focal consolidation. No pneumothorax. The the central airways are patent. There is atherosclerotic calcification of the thoracic aorta. The central pulmonary arteries are grossly unremarkable on this noncontrast study. There is no cardiomegaly or pericardial effusion. There is coronary vascular calcification. There is hypoattenuation of the cardiac blood pool suggestive of a degree of anemia. Clinical correlation is recommended. There is no mediastinal adenopathy. No significant hilar adenopathy noted. The esophagus is grossly unremarkable. No thyroid nodules identified. There is no axillary adenopathy. No supraclavicular adenopathy. The chest wall soft tissue appears unremarkable. Right pectoral Port-A-Cath with tip at the cavoatrial junction. There is osteopenia with degenerative changes of the spine. No acute fracture. Old right posterior rib fractures noted. The visualized upper abdomen appears unremarkable. IMPRESSION: Clusters of nodularity in the right middle lobe and lingula, new from prior study and most likely representing pneumonia. Metastatic disease is much less likely given rapid interval development of this nodules. Clinical correlation and follow-up recommended. Stable postsurgical changes of right upper lobectomy and stable small bilateral pleural effusions. Electronically Signed   By: Anner Crete M.D.   On: 01/20/2016 01:30   I have personally reviewed and evaluated these images and lab results as part of my medical decision-making.   EKG Interpretation   Date/Time:  Tuesday January 20 2016 00:10:16 EDT Ventricular Rate:  66 PR Interval:    QRS Duration: 111 QT Interval:  418 QTC Calculation: 438 R Axis:   80 Text Interpretation:  Sinus rhythm  Borderline ST depression, diffuse leads  No significant change since last tracing Confirmed by Glynn Octave [phone]) on 01/20/2016 12:30:15 AM      MDM   Final diagnoses:  None    Patient presents to the ED for worsening SOB and headache with vomiting.  After recent surgery, there is concern for complications in the patients brain.  CT scan shows normal postop course.  I also ordered CT chest which does reveal PNA on the R side consistent with his exam.  He was treated for HCAP with vanc and cefepime.  Dr. Porfirio Mylar accepts the patient for further care.   I personally performed the services described in this documentation, which was scribed in my presence. The recorded information has been reviewed and is accurate.      Darren Balls, MD 01/20/16 [phone]

## 2016-01-20 ENCOUNTER — Encounter (HOSPITAL_COMMUNITY): Payer: Self-pay | Admitting: General Practice

## 2016-01-20 ENCOUNTER — Emergency Department (HOSPITAL_COMMUNITY): Payer: Medicare Other

## 2016-01-20 DIAGNOSIS — C779 Secondary and unspecified malignant neoplasm of lymph node, unspecified: Secondary | ICD-10-CM | POA: Diagnosis not present

## 2016-01-20 DIAGNOSIS — D649 Anemia, unspecified: Secondary | ICD-10-CM | POA: Diagnosis present

## 2016-01-20 DIAGNOSIS — I110 Hypertensive heart disease with heart failure: Secondary | ICD-10-CM | POA: Diagnosis not present

## 2016-01-20 DIAGNOSIS — D6489 Other specified anemias: Secondary | ICD-10-CM | POA: Diagnosis present

## 2016-01-20 DIAGNOSIS — F419 Anxiety disorder, unspecified: Secondary | ICD-10-CM | POA: Diagnosis present

## 2016-01-20 DIAGNOSIS — Z515 Encounter for palliative care: Secondary | ICD-10-CM | POA: Diagnosis not present

## 2016-01-20 DIAGNOSIS — D696 Thrombocytopenia, unspecified: Secondary | ICD-10-CM | POA: Diagnosis not present

## 2016-01-20 DIAGNOSIS — J440 Chronic obstructive pulmonary disease with acute lower respiratory infection: Secondary | ICD-10-CM | POA: Diagnosis not present

## 2016-01-20 DIAGNOSIS — I10 Essential (primary) hypertension: Secondary | ICD-10-CM | POA: Diagnosis not present

## 2016-01-20 DIAGNOSIS — E440 Moderate protein-calorie malnutrition: Secondary | ICD-10-CM | POA: Diagnosis not present

## 2016-01-20 DIAGNOSIS — Y95 Nosocomial condition: Secondary | ICD-10-CM | POA: Diagnosis present

## 2016-01-20 DIAGNOSIS — E871 Hypo-osmolality and hyponatremia: Secondary | ICD-10-CM | POA: Diagnosis not present

## 2016-01-20 DIAGNOSIS — I169 Hypertensive crisis, unspecified: Secondary | ICD-10-CM | POA: Diagnosis present

## 2016-01-20 DIAGNOSIS — J441 Chronic obstructive pulmonary disease with (acute) exacerbation: Secondary | ICD-10-CM | POA: Diagnosis not present

## 2016-01-20 DIAGNOSIS — K219 Gastro-esophageal reflux disease without esophagitis: Secondary | ICD-10-CM | POA: Diagnosis present

## 2016-01-20 DIAGNOSIS — G8194 Hemiplegia, unspecified affecting left nondominant side: Secondary | ICD-10-CM | POA: Diagnosis present

## 2016-01-20 DIAGNOSIS — F329 Major depressive disorder, single episode, unspecified: Secondary | ICD-10-CM | POA: Diagnosis present

## 2016-01-20 DIAGNOSIS — N400 Enlarged prostate without lower urinary tract symptoms: Secondary | ICD-10-CM | POA: Diagnosis present

## 2016-01-20 DIAGNOSIS — A419 Sepsis, unspecified organism: Secondary | ICD-10-CM

## 2016-01-20 DIAGNOSIS — I959 Hypotension, unspecified: Secondary | ICD-10-CM | POA: Diagnosis present

## 2016-01-20 DIAGNOSIS — J9621 Acute and chronic respiratory failure with hypoxia: Secondary | ICD-10-CM | POA: Diagnosis not present

## 2016-01-20 DIAGNOSIS — J189 Pneumonia, unspecified organism: Secondary | ICD-10-CM | POA: Diagnosis present

## 2016-01-20 DIAGNOSIS — I5033 Acute on chronic diastolic (congestive) heart failure: Secondary | ICD-10-CM | POA: Diagnosis not present

## 2016-01-20 DIAGNOSIS — C7931 Secondary malignant neoplasm of brain: Secondary | ICD-10-CM | POA: Diagnosis not present

## 2016-01-20 DIAGNOSIS — C3490 Malignant neoplasm of unspecified part of unspecified bronchus or lung: Secondary | ICD-10-CM | POA: Diagnosis not present

## 2016-01-20 DIAGNOSIS — E876 Hypokalemia: Secondary | ICD-10-CM | POA: Diagnosis present

## 2016-01-20 DIAGNOSIS — I5032 Chronic diastolic (congestive) heart failure: Secondary | ICD-10-CM | POA: Diagnosis not present

## 2016-01-20 DIAGNOSIS — I6200 Nontraumatic subdural hemorrhage, unspecified: Secondary | ICD-10-CM | POA: Diagnosis not present

## 2016-01-20 LAB — COMPREHENSIVE METABOLIC PANEL
ALK PHOS: 47 U/L (ref 38–126)
ALT: 28 U/L (ref 17–63)
AST: 29 U/L (ref 15–41)
Albumin: 2.5 g/dL — ABNORMAL LOW (ref 3.5–5.0)
Anion gap: 7 (ref 5–15)
BILIRUBIN TOTAL: 0.5 mg/dL (ref 0.3–1.2)
BUN: 16 mg/dL (ref 6–20)
CALCIUM: 7.9 mg/dL — AB (ref 8.9–10.3)
CO2: 29 mmol/L (ref 22–32)
CREATININE: 0.71 mg/dL (ref 0.61–1.24)
Chloride: 97 mmol/L — ABNORMAL LOW (ref 101–111)
GFR calc non Af Amer: >60 mL/min (ref >60)
Glucose, Bld: 179 mg/dL — ABNORMAL HIGH (ref 65–99)
Potassium: 3.4 mmol/L — ABNORMAL LOW (ref 3.5–5.1)
SODIUM: 133 mmol/L — AB (ref 135–145)
TOTAL PROTEIN: 4.4 g/dL — AB (ref 6.5–8.1)

## 2016-01-20 LAB — CBC WITH DIFFERENTIAL/PLATELET
BASOS ABS: 0 10*3/uL (ref 0.0–0.1)
BASOS PCT: 0 %
EOS ABS: 0 10*3/uL (ref 0.0–0.7)
Eosinophils Relative: 0 %
HCT: 26 % — ABNORMAL LOW (ref 39.0–52.0)
HEMOGLOBIN: 8.4 g/dL — AB (ref 13.0–17.0)
Lymphocytes Relative: 14 %
Lymphs Abs: 0.9 10*3/uL (ref 0.7–4.0)
MCH: 30.4 pg (ref 26.0–34.0)
MCHC: 32.3 g/dL (ref 30.0–36.0)
MCV: 94.2 fL (ref 78.0–100.0)
MONO ABS: 0.4 10*3/uL (ref 0.1–1.0)
MONOS PCT: 7 %
NEUTROS PCT: 79 %
Neutro Abs: 4.9 10*3/uL (ref 1.7–7.7)
Platelets: 115 10*3/uL — ABNORMAL LOW (ref 150–400)
RBC: 2.76 MIL/uL — ABNORMAL LOW (ref 4.22–5.81)
RDW: 15.4 % (ref 11.5–15.5)
WBC: 6.2 10*3/uL (ref 4.0–10.5)

## 2016-01-20 LAB — URINALYSIS, ROUTINE W REFLEX MICROSCOPIC
Bilirubin Urine: NEGATIVE
Glucose, UA: NEGATIVE mg/dL
Hgb urine dipstick: NEGATIVE
Ketones, ur: NEGATIVE mg/dL
Leukocytes, UA: NEGATIVE
NITRITE: NEGATIVE
Protein, ur: NEGATIVE mg/dL
Specific Gravity, Urine: 1.017 (ref 1.005–1.030)
pH: 7 (ref 5.0–8.0)

## 2016-01-20 LAB — LACTIC ACID, PLASMA
Lactic Acid, Venous: 1.9 mmol/L (ref 0.5–1.9)
Lactic Acid, Venous: 2 mmol/L (ref 0.5–1.9)

## 2016-01-20 LAB — BRAIN NATRIURETIC PEPTIDE: B NATRIURETIC PEPTIDE 5: 134.3 pg/mL — AB (ref 0.0–100.0)

## 2016-01-20 LAB — PROCALCITONIN: Procalcitonin: <0.1 ng/mL

## 2016-01-20 LAB — PROTIME-INR
INR: 0.98 (ref 0.00–1.49)
Prothrombin Time: 13.2 seconds (ref 11.6–15.2)

## 2016-01-20 LAB — I-STAT CG4 LACTIC ACID, ED
LACTIC ACID, VENOUS: 1.53 mmol/L (ref 0.5–1.9)
Lactic Acid, Venous: 2.18 mmol/L (ref 0.5–1.9)

## 2016-01-20 LAB — MRSA PCR SCREENING: MRSA BY PCR: NEGATIVE

## 2016-01-20 LAB — HIV ANTIBODY (ROUTINE TESTING W REFLEX): HIV SCREEN 4TH GENERATION: NONREACTIVE

## 2016-01-20 LAB — APTT: aPTT: 26 seconds (ref 24–37)

## 2016-01-20 LAB — POC OCCULT BLOOD, ED: FECAL OCCULT BLD: NEGATIVE

## 2016-01-20 LAB — STREP PNEUMONIAE URINARY ANTIGEN: STREP PNEUMO URINARY ANTIGEN: NEGATIVE

## 2016-01-20 LAB — CORTISOL-AM, BLOOD: CORTISOL - AM: 0.4 ug/dL — AB (ref 6.7–22.6)

## 2016-01-20 MED ORDER — VANCOMYCIN HCL 10 G IV SOLR
1500.0000 mg | Freq: Once | INTRAVENOUS | Status: AC
  Administered 2016-01-20: 1500 mg via INTRAVENOUS
  Filled 2016-01-20: qty 1500

## 2016-01-20 MED ORDER — IPRATROPIUM-ALBUTEROL 0.5-2.5 (3) MG/3ML IN SOLN
3.0000 mL | Freq: Three times a day (TID) | RESPIRATORY_TRACT | Status: DC
  Administered 2016-01-20: 3 mL via RESPIRATORY_TRACT
  Filled 2016-01-20: qty 3

## 2016-01-20 MED ORDER — TRIAMCINOLONE ACETONIDE 0.1 % EX CREA: 1.0000 "application " | TOPICAL_CREAM | Freq: Two times a day (BID) | CUTANEOUS | Status: DC | PRN

## 2016-01-20 MED ORDER — SODIUM CHLORIDE 1 G PO TABS
2.0000 g | ORAL_TABLET | Freq: Three times a day (TID) | ORAL | Status: DC
  Administered 2016-01-20 – 2016-01-28 (×22): 2 g via ORAL
  Filled 2016-01-20 (×27): qty 2

## 2016-01-20 MED ORDER — FAMOTIDINE 20 MG PO TABS
40.0000 mg | ORAL_TABLET | Freq: Two times a day (BID) | ORAL | Status: DC
Start: 2016-01-20 — End: 2016-01-28
  Administered 2016-01-20 – 2016-01-28 (×16): 40 mg via ORAL
  Filled 2016-01-20 (×2): qty 1
  Filled 2016-01-20: qty 2
  Filled 2016-01-20 (×2): qty 1
  Filled 2016-01-20: qty 2
  Filled 2016-01-20 (×2): qty 1
  Filled 2016-01-20: qty 2
  Filled 2016-01-20 (×2): qty 1
  Filled 2016-01-20 (×5): qty 2
  Filled 2016-01-20: qty 1

## 2016-01-20 MED ORDER — GUAIFENESIN ER 600 MG PO TB12
600.0000 mg | ORAL_TABLET | Freq: Two times a day (BID) | ORAL | Status: DC
  Administered 2016-01-20: 600 mg via ORAL
  Filled 2016-01-20: qty 1

## 2016-01-20 MED ORDER — ASPIRIN EC 81 MG PO TBEC: 81.0000 mg | DELAYED_RELEASE_TABLET | Freq: Every day | ORAL | Status: DC

## 2016-01-20 MED ORDER — CLONAZEPAM 0.5 MG PO TABS
0.5000 mg | ORAL_TABLET | Freq: Two times a day (BID) | ORAL | Status: DC | PRN
  Administered 2016-01-20 – 2016-01-25 (×8): 0.5 mg via ORAL
  Filled 2016-01-20 (×8): qty 1

## 2016-01-20 MED ORDER — ENSURE ENLIVE PO LIQD
237.0000 mL | Freq: Two times a day (BID) | ORAL | Status: DC
  Administered 2016-01-20 – 2016-01-28 (×3): 237 mL via ORAL

## 2016-01-20 MED ORDER — DM-GUAIFENESIN ER 30-600 MG PO TB12
1.0000 | ORAL_TABLET | Freq: Two times a day (BID) | ORAL | Status: DC
  Administered 2016-01-20 – 2016-01-28 (×16): 1 via ORAL
  Filled 2016-01-20 (×17): qty 1

## 2016-01-20 MED ORDER — TAMSULOSIN HCL 0.4 MG PO CAPS
0.4000 mg | ORAL_CAPSULE | Freq: Every day | ORAL | Status: DC
  Administered 2016-01-20 – 2016-01-28 (×8): 0.4 mg via ORAL
  Filled 2016-01-20 (×9): qty 1

## 2016-01-20 MED ORDER — LEVETIRACETAM 750 MG PO TABS
1500.0000 mg | ORAL_TABLET | Freq: Two times a day (BID) | ORAL | Status: DC
  Administered 2016-01-20 – 2016-01-28 (×16): 1500 mg via ORAL
  Filled 2016-01-20 (×6): qty 2
  Filled 2016-01-20: qty 3
  Filled 2016-01-20: qty 2
  Filled 2016-01-20: qty 3
  Filled 2016-01-20 (×6): qty 2
  Filled 2016-01-20 (×2): qty 3

## 2016-01-20 MED ORDER — HYDROCODONE-ACETAMINOPHEN 7.5-325 MG PO TABS
1.0000 | ORAL_TABLET | Freq: Four times a day (QID) | ORAL | Status: DC | PRN
  Administered 2016-01-20 – 2016-01-25 (×5): 1 via ORAL
  Filled 2016-01-20 (×5): qty 1

## 2016-01-20 MED ORDER — CYANOCOBALAMIN 500 MCG PO TABS
500.0000 ug | ORAL_TABLET | Freq: Every day | ORAL | Status: DC
  Administered 2016-01-20 – 2016-01-28 (×8): 500 ug via ORAL
  Filled 2016-01-20 (×3): qty 1
  Filled 2016-01-20: qty 0.5
  Filled 2016-01-20 (×2): qty 1
  Filled 2016-01-20: qty 0.5
  Filled 2016-01-20: qty 1
  Filled 2016-01-20 (×2): qty 0.5
  Filled 2016-01-20: qty 1

## 2016-01-20 MED ORDER — ISOSORBIDE MONONITRATE ER 30 MG PO TB24
30.0000 mg | ORAL_TABLET | Freq: Every day | ORAL | Status: DC
  Administered 2016-01-20 – 2016-01-28 (×8): 30 mg via ORAL
  Filled 2016-01-20 (×10): qty 1

## 2016-01-20 MED ORDER — IPRATROPIUM-ALBUTEROL 0.5-2.5 (3) MG/3ML IN SOLN: 3.0000 mL | RESPIRATORY_TRACT | Status: DC | PRN

## 2016-01-20 MED ORDER — DOCUSATE SODIUM 100 MG PO CAPS
100.0000 mg | ORAL_CAPSULE | Freq: Two times a day (BID) | ORAL | Status: DC
  Administered 2016-01-20 – 2016-01-28 (×16): 100 mg via ORAL
  Filled 2016-01-20 (×17): qty 1

## 2016-01-20 MED ORDER — CETYLPYRIDINIUM CHLORIDE 0.05 % MT LIQD
7.0000 mL | Freq: Two times a day (BID) | OROMUCOSAL | Status: DC
  Administered 2016-01-20 – 2016-01-28 (×15): 7 mL via OROMUCOSAL

## 2016-01-20 MED ORDER — SODIUM CHLORIDE 0.9 % IV SOLN
INTRAVENOUS | Status: DC
  Administered 2016-01-20 (×2): 1000 mL via INTRAVENOUS
  Administered 2016-01-21 – 2016-01-22 (×3): via INTRAVENOUS

## 2016-01-20 MED ORDER — DEXTROSE 5 % IV SOLN
2.0000 g | Freq: Once | INTRAVENOUS | Status: AC
  Administered 2016-01-20: 2 g via INTRAVENOUS
  Filled 2016-01-20: qty 2

## 2016-01-20 MED ORDER — DEXTROSE 5 % IV SOLN
1.0000 g | Freq: Three times a day (TID) | INTRAVENOUS | Status: DC
  Administered 2016-01-20 – 2016-01-23 (×9): 1 g via INTRAVENOUS
  Filled 2016-01-20 (×13): qty 1

## 2016-01-20 MED ORDER — DEXAMETHASONE 4 MG PO TABS
4.0000 mg | ORAL_TABLET | Freq: Four times a day (QID) | ORAL | Status: DC
  Administered 2016-01-20 – 2016-01-28 (×33): 4 mg via ORAL
  Filled 2016-01-20 (×37): qty 1

## 2016-01-20 MED ORDER — POTASSIUM CHLORIDE 20 MEQ/15ML (10%) PO SOLN: 10.0000 meq | Freq: Once | ORAL | Status: DC

## 2016-01-20 MED ORDER — BUDESONIDE 0.25 MG/2ML IN SUSP
0.2500 mg | Freq: Two times a day (BID) | RESPIRATORY_TRACT | Status: DC
  Administered 2016-01-20 – 2016-01-28 (×17): 0.25 mg via RESPIRATORY_TRACT
  Filled 2016-01-20 (×18): qty 2

## 2016-01-20 MED ORDER — HYDROCORTISONE NA SUCCINATE PF 100 MG IJ SOLR
50.0000 mg | Freq: Once | INTRAMUSCULAR | Status: AC
  Administered 2016-01-20: 50 mg via INTRAVENOUS
  Filled 2016-01-20: qty 2

## 2016-01-20 MED ORDER — ZOLPIDEM TARTRATE 5 MG PO TABS
5.0000 mg | ORAL_TABLET | Freq: Every evening | ORAL | Status: DC | PRN
  Administered 2016-01-20 – 2016-01-21 (×2): 5 mg via ORAL
  Filled 2016-01-20 (×2): qty 1

## 2016-01-20 MED ORDER — SALINE SPRAY 0.65 % NA SOLN
1.0000 | NASAL | Status: DC | PRN
  Filled 2016-01-20: qty 44

## 2016-01-20 MED ORDER — SENNA 8.6 MG PO TABS
2.0000 | ORAL_TABLET | Freq: Every day | ORAL | Status: DC
Start: 2016-01-20 — End: 2016-01-28
  Administered 2016-01-20 – 2016-01-28 (×8): 17.2 mg via ORAL
  Filled 2016-01-20 (×10): qty 2

## 2016-01-20 MED ORDER — IPRATROPIUM-ALBUTEROL 0.5-2.5 (3) MG/3ML IN SOLN
3.0000 mL | RESPIRATORY_TRACT | Status: DC
  Administered 2016-01-20 – 2016-01-23 (×16): 3 mL via RESPIRATORY_TRACT
  Filled 2016-01-20 (×18): qty 3

## 2016-01-20 MED ORDER — ALBUTEROL SULFATE (2.5 MG/3ML) 0.083% IN NEBU: 2.5000 mg | INHALATION_SOLUTION | RESPIRATORY_TRACT | Status: DC | PRN

## 2016-01-20 MED ORDER — PRAMIPEXOLE DIHYDROCHLORIDE 1 MG PO TABS
1.0000 mg | ORAL_TABLET | Freq: Every day | ORAL | Status: DC
  Administered 2016-01-20 – 2016-01-27 (×8): 1 mg via ORAL
  Filled 2016-01-20 (×8): qty 1

## 2016-01-20 MED ORDER — SODIUM CHLORIDE 0.9 % IV BOLUS (SEPSIS)
1000.0000 mL | Freq: Once | INTRAVENOUS | Status: AC
  Administered 2016-01-20: 1000 mL via INTRAVENOUS

## 2016-01-20 MED ORDER — VANCOMYCIN HCL IN DEXTROSE 1-5 GM/200ML-% IV SOLN
1000.0000 mg | Freq: Two times a day (BID) | INTRAVENOUS | Status: DC
Start: 2016-01-20 — End: 2016-01-23
  Administered 2016-01-20 – 2016-01-23 (×7): 1000 mg via INTRAVENOUS
  Filled 2016-01-20 (×10): qty 200

## 2016-01-20 MED ORDER — ENOXAPARIN SODIUM 40 MG/0.4ML ~~LOC~~ SOLN: 40.0000 mg | Freq: Every day | SUBCUTANEOUS | Status: DC

## 2016-01-20 NOTE — Progress Notes (Signed)
Pharmacy Antibiotic Note  [name] is a 75 y.o. male admitted on 01/19/2016 with head pain from incision s/p neurosurgery.  Pharmacy has been consulted for Vancomycin dosing. Pt with hx metastatic lung CA. Now with possible PNA on CT. WBC WNL. Renal function ok.   Plan: -Vancomycin 1000 mg IV q12h -Cefepime 1g IV q8h -Trend WBC, temp, renal function  -Drug levels as indicated   Temp (24hrs), Avg:98.9 F (37.2 C), Min:98.7 F (37.1 C), Max:99 F (37.2 C)   Recent Labs Lab 01/13/16 0430 01/14/16 0500 01/16/16 0505 01/17/16 1211 01/17/16 1320 01/20/16 0001 01/20/16 0028 01/20/16 0321  WBC 5.8 5.6 5.8 7.1  --  6.2  --   --   CREATININE 0.68 0.69 0.78 0.65  --  0.71  --   --   LATICACIDVEN  --   --   --   --  1.2  --  2.18* 1.53    Estimated Creatinine Clearance: 84.9 mL/min (by C-G formula based on Cr of 0.71).    No Known Allergies   Narda Bonds 01/20/2016 3:48 AM

## 2016-01-20 NOTE — ED Notes (Signed)
MD at bedside. 

## 2016-01-20 NOTE — H&P (Signed)
History and Physical    [name] [id number]:[id number] DOB: [birth year] DOA: 01/19/2016  Referring MD/NP/PA:   PCP: [name], PA-C   Patient coming from:  The patient is coming from SNF.  At baseline, pt is dependent for most of ADL.   Chief Complaint: SOB and cough  HPI: [name] is a 75 y.o. male with medical history significant of COPD, chronic respiratory failure on 2 L nasal cannula oxygen, dCHF, NSCLC with metastases (s/p RU lobectomy/radiation/chemotherapy), brain metastases (s/p of craniotomy on 02/05/16), hypertension, stroke, seizure, BPH, depression, anxiety, GERD, who presents with shortness of breath and cough.   Patient reports that he has worsening shortness of breath in the past 2 days. He coughs up yellow colored sputum, no fever, chills or chest pain. He has nausea and vomited once, but no abdominal pain or diarrhea. Patient does not have symptoms of UTI. No new unilateral weakness, vision change or hearing loss.   ED Course: pt was found to have WBC 6.2, temperature 90.9, bradycardia, especially tachypnea, potassium 3.4, creatinine normal, low blood pressure 86/63 which improved her to 119/65 with 1 L normal saline bolus. Pt is admitted to tele bed for further eval and treatment.  # CT-chest showed clusters of nodularity in the right middle lobe and lingula, new from prior study and most likely representing pneumonia. Metastatic disease is much less likely given rapid interval development of this nodules. stable postsurgical changes of right upper lobectomy and stable small bilateral pleural effusions.  # CT head showed postoperative changes in the right posterior parietal region with right posterior parietal intraparenchymal and subdural hematomas demonstrating typical evolution ule changes since previous study. No evidence of progression. No developing mass effect.   Review of Systems:   General: no fevers, chills, no changes in body weight, has poor appetite,  has fatigue HEENT: no blurry vision, hearing changes or sore throat Pulm: has dyspnea, coughing, no wheezing CV: no chest pain, no palpitations Abd: has nausea, vomiting, no abdominal pain, diarrhea, constipation GU: no dysuria, burning on urination, increased urinary frequency, hematuria  Ext: no leg edema Neuro: no new unilateral weakness, numbness, or tingling, no vision change or hearing loss Skin: no rash MSK: No muscle spasm, no deformity, no limitation of range of movement in spin Heme: No easy bruising.  Travel history: No recent long distant travel.  Allergy: No Known Allergies  Past Medical History  Diagnosis Date  . Arthritis   . Wheezing   . Sore throat   . Carotid artery occlusion   . COPD (chronic obstructive pulmonary disease) ([place])   . Lumbar disc disease   . Restless leg syndrome   . Allergic rhinitis   . Hypertension   . Anxiety     anxiety  . Shortness of breath   . Lung mass   . GERD (gastroesophageal reflux disease)   . H/O hiatal hernia   . Pre-operative cardiovascular examination   . Nonspecific abnormal unspecified cardiovascular function study   . Peripheral vascular disease, unspecified ([place])   . Pneumonia 2014    ?   Marland Kitchen On home oxygen therapy     prn  . Constipation   . 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 S/P radiation therapy 12/04/14 srs    lt frontal brain  . S/P radiation therapy 07/18/15    SRS left frontal brain 20Gy  . Seizures ([place]) 12/13/15  . Cancer ([place]) 09/14/12    invasive mod diff squamous cell  ca  . Metastasis to brain ([place]) 02/2015  . History of chemotherapy   . Headache   . Occasional tremors     feet and legs     Past Surgical History  Procedure Laterality Date  . Carotid endarterectomy  10/15/2010    left  . Hand surgery      repair of the left long, ring, and small finger after a saw injury  . Video bronchoscopy  07/24/2012    Procedure: VIDEO BRONCHOSCOPY WITH FLUORO;  Surgeon: Kathee Delton, MD;   Location: [facility];  Service: Cardiopulmonary;  Laterality: Bilateral;  . Lobectomy Right 09/14/2012    Procedure: LOBECTOMY;  Surgeon: Ivin Poot, MD;  Location: [place];  Service: Thoracic;  Laterality: Right;  RIGHT UPPER LOBECTOMY  . Video assisted thoracoscopy Right 09/14/2012    Procedure: VIDEO ASSISTED THORACOSCOPY;  Surgeon: Ivin Poot, MD;  Location: [place];  Service: Thoracic;  Laterality: Right;  . Video bronchoscopy with endobronchial ultrasound N/A 08/27/2014    Procedure: VIDEO BRONCHOSCOPY WITH ENDOBRONCHIAL ULTRASOUND;  Surgeon: Ivin Poot, MD;  Location: [place];  Service: Thoracic;  Laterality: N/A;  . Scalene node biopsy Right 08/27/2014    Procedure: BIOPSY SCALENE NODE;  Surgeon: Ivin Poot, MD;  Location: [place];  Service: Thoracic;  Laterality: Right;  . Sterotactic radiosurgery Right 09/25/14    right parietal 30Gy/1 fx  . Power port a cath Right   . Colonoscopy    . Craniotomy N/A 01/06/2016    Procedure: CRANIOTOMY TUMOR EXCISION WITH CURVE ;  Surgeon: Kary Kos, MD;  Location: [facility];  Service: Neurosurgery;  Laterality: N/A;    Social History:  reports that he quit smoking about 17 months ago. His smoking use included Cigarettes. He has a 55 pack-year smoking history. He quit smokeless tobacco use about 63 years ago. He reports that he drinks alcohol. He reports that he does not use illicit drugs.  Family History:  Family History  Problem Relation Age of Onset  . Heart disease Father     MI  . Heart attack Father   . Alcohol abuse Mother   . Stroke Brother   . Heart disease Brother   . Depression Sister   . Heart attack Brother      Prior to Admission medications   Medication Sig Start Date End Date Taking? Authorizing Provider  amLODipine (NORVASC) 5 MG tablet Take 5 mg by mouth daily.   Yes Historical Provider, MD  aspirin EC 81 MG tablet Take 81 mg by mouth daily. Reported on 12/22/2015   Yes Historical Provider, MD  budesonide  (PULMICORT) 0.25 MG/2ML nebulizer solution Take 2 mLs (0.25 mg total) by nebulization 2 (two) times daily. 01/16/16  Yes Orson Eva, MD  clonazePAM (KLONOPIN) 0.5 MG tablet Take 0.5 mg by mouth 2 (two) times daily as needed for anxiety. Takes one tablet by mouth two times daily as needed for anxiety.   Yes Historical Provider, MD  cloNIDine (CATAPRES) 0.1 MG tablet Take 0.1 mg by mouth 2 (two) times daily.   Yes Historical Provider, MD  dexamethasone (DECADRON) 4 MG tablet Take 1 tablet (4 mg total) by mouth 4 (four) times daily. 01/16/16  Yes Erline Levine, MD  docusate sodium (COLACE) 100 MG capsule Take 1 capsule (100 mg total) by mouth 2 (two) times daily. 01/16/16  Yes Orson Eva, MD  famotidine (PEPCID) 40 MG tablet Take 1 tablet (40 mg total) by mouth 2 (two) times daily.  01/16/16  Yes Orson Eva, MD  feeding supplement, ENSURE ENLIVE, (ENSURE ENLIVE) LIQD Take 237 mLs by mouth 2 (two) times daily between meals. 01/16/16  Yes Orson Eva, MD  furosemide (LASIX) 40 MG tablet Take 1 tablet (40 mg total) by mouth 2 (two) times daily. 01/02/16  Yes Noralee Space, MD  guaifenesin (HUMIBID E) 400 MG TABS tablet Take 400 mg by mouth 2 (two) times daily.   Yes Historical Provider, MD  HYDROcodone-acetaminophen (NORCO) 7.5-325 MG tablet Take 1 tablet by mouth every 6 (six) hours as needed for moderate pain. 09/25/15  Yes Heather F Boelter, NP  ipratropium-albuterol (DUONEB) 0.5-2.5 (3) MG/3ML SOLN Take 3 mLs by nebulization 4 (four) times daily. Patient taking differently: Take 3 mLs by nebulization 4 (four) times daily. Pt takes nebulizer 2 to 3 times daily. 12/18/14  Yes Noralee Space, MD  isosorbide mononitrate (IMDUR) 30 MG 24 hr tablet Take 1 tablet (30 mg total) by mouth daily. 08/12/15  Yes Jettie Booze, MD  levETIRAcetam (KEPPRA) 750 MG tablet Take 2 tablets (1,500 mg total) by mouth 2 (two) times daily. 12/24/15  Yes Penni Bombard, MD  metoprolol (LOPRESSOR) 50 MG tablet Take 1 tablet (50 mg total) by  mouth 2 (two) times daily. 01/16/16  Yes Orson Eva, MD  OXYGEN Inhale 2-3 L into the lungs continuous.   Yes Historical Provider, MD  pramipexole (MIRAPEX) 1 MG tablet Take 1 mg by mouth at bedtime.   Yes Historical Provider, MD  senna (SENOKOT) 8.6 MG TABS tablet Take 2 tablets (17.2 mg total) by mouth daily. 01/16/16  Yes Orson Eva, MD  sodium chloride (OCEAN) 0.65 % SOLN nasal spray Place 1 spray into both nostrils as needed for congestion.   Yes Historical Provider, MD  sodium chloride 1 g tablet Take 2 tablets (2 g total) by mouth 3 (three) times daily with meals. 01/16/16  Yes Orson Eva, MD  tamsulosin (FLOMAX) 0.4 MG CAPS capsule Take 1 capsule (0.4 mg total) by mouth daily. 01/16/16  Yes Orson Eva, MD  triamcinolone cream (KENALOG) 0.1 % Apply 1 application topically 2 (two) times daily as needed (itching/ dry skin).  10/30/15  Yes Historical Provider, MD  vitamin B-12 500 MCG tablet Take 1 tablet (500 mcg total) by mouth daily. 01/16/16  Yes Orson Eva, MD  zolpidem (AMBIEN) 10 MG tablet Take 10 mg by mouth at bedtime as needed for sleep. Reported on 09/25/2015   Yes Historical Provider, MD    Physical Exam: Filed Vitals:   01/20/16 0600 01/20/16 0620 01/20/16 0630 01/20/16 0700  BP: 142/64  129/63 137/62  Pulse: 58  58 56  Temp:      TempSrc:      Resp: '20  17 20  '$ SpO2: 100% 100% 100% 99%   General: Not in acute distress HEENT: s/p of craniotomy, with surgical staples in posterior scalp without signs of infection.       Eyes: PERRL, EOMI, no scleral icterus.       ENT: No discharge from the ears and nose, no pharynx injection, no tonsillar enlargement.        Neck: No JVD, no bruit, no mass felt. Heme: No neck lymph node enlargement. Cardiac: S1/S2, RRR, No murmurs, No gallops or rubs. Pulm: has rhonchi and rales bilaterally. No wheezing or rubs. Abd: Soft, nondistended, nontender, no rebound pain, no organomegaly, BS present. GU: No hematuria  Ext: No pitting leg edema bilaterally.  2+DP/PT pulse bilaterally. Musculoskeletal: No joint  deformities, No joint redness or warmth, no limitation of ROM in spin. Skin: No rashes.  Neuro: Alert, oriented X3, cranial nerves II-XII grossly intact. Psych: Patient is not psychotic, no suicidal or hemocidal ideation.  Labs on Admission: I have personally reviewed following labs and imaging studies  CBC:  Recent Labs Lab 01/14/16 0500 01/15/16 0430 01/16/16 0505 01/17/16 1211 01/20/16 0001  WBC 5.6  --  5.8 7.1 6.2  NEUTROABS  --   --   --  5.0 4.9  HGB 8.9*  --  8.1* 10.7* 8.4*  HCT 27.8* 27.6* 24.5* 33.1* 26.0*  MCV 93.9  --  93.5 93.8 94.2  PLT 121*  --  115* 117* 025*   Basic Metabolic Panel:  Recent Labs Lab 01/14/16 0500 01/16/16 0505 01/17/16 1211 01/20/16 0001  NA 133* 133* 132* 133*  K 4.1 4.0 3.8 3.4*  CL 92* 93* 93* 97*  CO2 36* 34* 33* 29  GLUCOSE 114* 122* 85 179*  BUN 21* 28* 19 16  CREATININE 0.69 0.78 0.65 0.71  CALCIUM 8.0* 7.8* 8.7* 7.9*  MG  --  2.4  --   --    GFR: Estimated Creatinine Clearance: 84.9 mL/min (by C-G formula based on Cr of 0.71). Liver Function Tests:  Recent Labs Lab 01/20/16 0001  AST 29  ALT 28  ALKPHOS 47  BILITOT 0.5  PROT 4.4*  ALBUMIN 2.5*   No results for input(s): LIPASE, AMYLASE in the last 168 hours. No results for input(s): AMMONIA in the last 168 hours. Coagulation Profile:  Recent Labs Lab 01/20/16 0342  INR 0.98   Cardiac Enzymes: No results for input(s): CKTOTAL, CKMB, CKMBINDEX, TROPONINI in the last 168 hours. BNP (last 3 results) No results for input(s): PROBNP in the last 8760 hours. HbA1C: No results for input(s): HGBA1C in the last 72 hours. CBG:  Recent Labs Lab 01/15/16 1649 01/15/16 2141 01/16/16 0617 01/16/16 1113 01/16/16 1623  GLUCAP 153* 119* 120* 145* 123*   Lipid Profile: No results for input(s): CHOL, HDL, LDLCALC, TRIG, CHOLHDL, LDLDIRECT in the last 72 hours. Thyroid Function Tests: No results for input(s):  TSH, T4TOTAL, FREET4, T3FREE, THYROIDAB in the last 72 hours. Anemia Panel: No results for input(s): VITAMINB12, FOLATE, FERRITIN, TIBC, IRON, RETICCTPCT in the last 72 hours. Urine analysis:    Component Value Date/Time   COLORURINE YELLOW 01/20/2016 0027   APPEARANCEUR CLOUDY* 01/20/2016 0027   LABSPEC 1.017 01/20/2016 0027   PHURINE 7.0 01/20/2016 0027   GLUCOSEU NEGATIVE 01/20/2016 0027   HGBUR NEGATIVE 01/20/2016 0027   BILIRUBINUR NEGATIVE 01/20/2016 0027   KETONESUR NEGATIVE 01/20/2016 0027   PROTEINUR NEGATIVE 01/20/2016 0027   UROBILINOGEN 1.0 11/11/2014 0900   NITRITE NEGATIVE 01/20/2016 0027   LEUKOCYTESUR NEGATIVE 01/20/2016 0027   Sepsis Labs: '@LABRCNTIP'$ (procalcitonin:4,lacticidven:4) ) Recent Results (from the past 240 hour(s))  Culture, Urine     Status: None   Collection Time: 01/14/16  3:37 PM  Result Value Ref Range Status   Specimen Description URINE, CATHETERIZED  Final   Special Requests NONE  Final   Culture NO GROWTH  Final   Report Status 01/15/2016 FINAL  Final     Radiological Exams on Admission: Ct Head Wo Contrast  01/20/2016  CLINICAL DATA:  Recent brain surgery. Pain near the surgery site. History of metastasis to the right parietal and occipital region from lung cancer. EXAM: CT HEAD WITHOUT CONTRAST TECHNIQUE: Contiguous axial images were obtained from the base of the skull through the vertex without intravenous contrast.  COMPARISON:  01/14/2016 FINDINGS: Postoperative changes with right posterior parietal craniotomy. Plate and screw fixation of the bone flap. Skin clips consistent with recent surgery. Small subcutaneous scalp hematoma over the surgical region. There is an intraparenchymal hematoma in the right posterior parietal region measuring about 3.1 x 5 cm diameter. There is surrounding vasogenic edema. The hematoma is decreasing in density consistent with typical evolution since previous study. Small subdural hematoma is demonstrated under the  bone flap. These all likely represent postoperative changes. There is no significant progression since previous study. No new hemorrhagic foci. No midline shift. There is residual sulcal effacement. Underlying diffuse cerebral atrophy. Ventricular dilatation consistent with central atrophy. Basal cisterns are not effaced. Mucosal thickening in the paranasal sinuses. Partial opacification of left mastoid air cells. Vascular calcifications. IMPRESSION: Postoperative changes in the right posterior parietal region with right posterior parietal intraparenchymal and subdural hematomas demonstrating typical evolution ule changes since previous study. No evidence of progression. No developing mass effect. Electronically Signed   By: Lucienne Capers M.D.   On: 01/20/2016 01:12   Ct Chest Wo Contrast  01/20/2016  CLINICAL DATA:  75 year old male with fever and shortness of breath. History of metastatic lung cancer status post prior VATS procedure. EXAM: CT CHEST WITHOUT CONTRAST TECHNIQUE: Multidetector CT imaging of the chest was performed following the standard protocol without IV contrast. COMPARISON:  Chest radiograph dated 01/17/2016 and chest CT dated 12/29/2015 FINDINGS: Evaluation of this exam is limited in the absence of intravenous contrast. There is a small right pleural effusion which appears similar in size to the prior study. Trace left pleural effusions also noted, stable. There is mild centrilobular emphysema. Postsurgical changes of right upper lobectomy with areas of scarring noted. There is a cluster of nodular density in the right middle lobe. A cluster of nodularity is also noted in the lingula, new from prior study most compatible with developing pneumonia. Metastatic disease is less likely given these nodules were not present on the recent study of 12/29/2015. Clinical correlation and follow-up recommended. There is no focal consolidation. No pneumothorax. The the central airways are patent. There  is atherosclerotic calcification of the thoracic aorta. The central pulmonary arteries are grossly unremarkable on this noncontrast study. There is no cardiomegaly or pericardial effusion. There is coronary vascular calcification. There is hypoattenuation of the cardiac blood pool suggestive of a degree of anemia. Clinical correlation is recommended. There is no mediastinal adenopathy. No significant hilar adenopathy noted. The esophagus is grossly unremarkable. No thyroid nodules identified. There is no axillary adenopathy. No supraclavicular adenopathy. The chest wall soft tissue appears unremarkable. Right pectoral Port-A-Cath with tip at the cavoatrial junction. There is osteopenia with degenerative changes of the spine. No acute fracture. Old right posterior rib fractures noted. The visualized upper abdomen appears unremarkable. IMPRESSION: Clusters of nodularity in the right middle lobe and lingula, new from prior study and most likely representing pneumonia. Metastatic disease is much less likely given rapid interval development of this nodules. Clinical correlation and follow-up recommended. Stable postsurgical changes of right upper lobectomy and stable small bilateral pleural effusions. Electronically Signed   By: Anner Crete M.D.   On: 01/20/2016 01:30     EKG: Independently reviewed. Sinus rhythm, QTC 438, no ischemic change.  Assessment/Plan Principal Problem:   HCAP (healthcare-associated pneumonia) Active Problems:   Essential hypertension, benign   Brain metastasis ([place])   Sepsis ([place])   Hypokalemia   Metastatic lung cancer (metastasis from lung to other site) [facility])  COPD exacerbation ([facility])   Acute on chronic respiratory failure ([facility])   Thrombocytopenia ([facility])   Hyponatremia   BPH (benign prostatic hyperplasia)   Anemia   Acute on chronic respiratory failure due to possible HCAP vs. COPD exacerbation: CT-chest showed possible PNA. Pt may also have COPD exacerbation given  productive cough and rhonchi on auscultation.  - Will admit to telemetry bed as inpt - IV Vancomycin and cefepime - Mucinex for cough  - DuoNeb,  albuterol Neb prn for SOB - continue Pulmicort nebs - Urine legionella and S. pneumococcal antigen - Follow up blood culture x2, sputum culture and respiratory virus panel.  Sepsis: pt is septic on admission with hypotension and elevated lactate. Blood pressure responded to IV fluid quickly. currently hemodynamically stable. - will get Procalcitonin and trend lactic acid level per sepsis protocol - IVF: 1L of NS bolus in ED, followed by 100 mL per hour of NS (patient has diastolic congestive heart failure, limiting aggressive IV fluids treatment)   Sepsis - Assessment   Performed at:                2:04  Last Vitals:    Blood pressure 119/65, pulse 61, temperature 99 F (37.2 C), temperature source Rectal, resp. rate 26, SpO2 99 %.  Heart:      Regular rhythm  Lungs:     Rales and rhonchi to auscultation  Capillary Refill:   <2 seconds    Peripheral Pulse (include location): Brachial pulse palpable   Skin (include color):              Pale    Essential hypertension: pt was hypotensive on admission. -Hold blood pressure medications: Lasix, clonidine, amlodipine and metoprolol   Metastatic lung cancer and  Brain metastasis: s/p of RU lobectomy/radiation/chemotherapy, s/p of craniotomy on 02/05/16. On decadron at home. -will give stress dose Solu Cortef 50 mg 1 -Check cortisol level -Continue Decadron 4 mg 4 times daily  -pt is under palliative care  Hypokalemia: Potassium 3.4 -Repleted  Anemia and thrombocytopenia ([place]): Hgb 8.4 and platelet 115. This is likely 2/2 chemotheray.No bleeding tendency. -Follow-up CBC  GERD: -Pepcid  Protein calorie malnutrition-moderate -Ensure  Hyponatremia: Na=133. Likely due to decreased oral intake. -IV normal saline as above  Seizure:  -continue Keppra -check keppra  level  Depression and anxiety: no suicidal or homicidal ideations. -Continue home medications: Pramipexole, Klonopin  BPH: stable - Continue Flomax  Chronic dCHF: 2-D echo on 12/30/15 showed a year for 55-60 percent. Patient does not have leg edema or JVD. Chest is compensated. -Hold diuretics due to sepsis -Continue aspirin    DVT ppx: SQ Lovenox Code Status: Full code Family Communication: None at bed side.  Disposition Plan:  Anticipate discharge back to previous SNF environment Consults called:  none Admission status:  Inpatient/tele   Date of Service 01/20/2016    Ivor Costa [facility] Pager [phone]  If 7PM-7AM, please contact night-coverage [email or web address] Password [facility] 01/20/2016, 7:39 AM

## 2016-01-20 NOTE — Clinical Social Work Note (Signed)
Clinical Social Work Assessment  Patient Details  Name: [name] MRN: [id number] Date of Birth: [birth year]  Date of referral:  01/20/16               Reason for consult:  Discharge Planning                Permission sought to share information with:  Family Supports Permission granted to share information::  Yes, Verbal Permission Granted  Name::     [name]  Agency::     Relationship::  Wife  Contact Information:  [phone]  Housing/Transportation Living arrangements for the past 2 months:  [place] of Information:  Patient, Medical Team, Other (Comment Required) (Friend) Patient Interpreter Needed:  None Criminal Activity/Legal Involvement Pertinent to Current Situation/Hospitalization:  No - Comment as needed Significant Relationships:  Spouse, Friend Lives with:  Facility Resident Do you feel safe going back to the place where you live?  Yes Need for family participation in patient care:  Yes (Comment)  Care giving concerns:  Patient is from [facility].   Social Worker assessment / plan:  CSW met with patient. Friend at bedside. CSW introduced role and explained that discharge planning would be discussed. Patient confirmed that he is from [facility] but does not wish to return. Patient wants to go home. Patient and his friend state that he has been in and out of the hospital since he went to SNF the first time. CSW still waiting on PT recommendations. Patient eager to get staples out of his head. No further concerns. CSW encouraged patient to contact CSW as needed. CSW will continue to follow patient and facilitate discharge to SNF if recommended by PT and patient agreeable.  Employment status:  Retired Forensic scientist:  Medicare PT Recommendations:  Not assessed at this time Information / Referral to community resources:   (None. Patient may return to [facility].)  Patient/Family's Response to care:  Patient not agreeable to  return to [facility] at this time due to multiple hospitalizations since going there the first time. Patient's wife and friend supportive and involved in patient's care. Patient appreciated social work intervention.  Patient/Family's Understanding of and Emotional Response to Diagnosis, Current Treatment, and Prognosis:  Patient understands reason for rehab prior to this hospitalization but still does not wish to return. Patient appears happy with treatment thus far but is ready to get staples out of his head. That appears to be his main concern.  Emotional Assessment Appearance:  Appears stated age Attitude/Demeanor/Rapport:   (Pleasant) Affect (typically observed):  Appropriate, Calm, Pleasant Orientation:  Oriented to Self, Oriented to Place, Oriented to  Time, Oriented to Situation Alcohol / Substance use:  Never Used Psych involvement (Current and /or in the community):  No (Comment)  Discharge Needs  Concerns to be addressed:  Care Coordination Readmission within the last 30 days:  Yes Current discharge risk:  None Barriers to Discharge:  No Barriers Identified   Candie Chroman, LCSW 01/20/2016, 2:05 PM

## 2016-01-20 NOTE — Evaluation (Signed)
Physical Therapy Evaluation Patient Details Name: [name] MRN: [id number] DOB: 08/22/1940 Today's Date: 01/20/2016   History of Present Illness  Patient is a 75 y/o male with recurrent lung ca with brain mets, COPD, restless leg syndrome, anxiety, s/p craniotomy with resection of a right occipital mass. Pt presents this admission with SOB and cough. Suspect developing pneumonia based on CT chest.      Clinical Impression  Pt admitted with above diagnosis. Pt currently with functional limitations due to the deficits listed below (see PT Problem List). [name] presents with a poor awareness of his deficits and requires cues and mod assist for bed mobility.  Wife unable to provide physical assist at home.  PTA pt requiring assist for ADLs and max +2 assist for OOB activity.  Recommending SNF at d/c.  Pt will benefit from skilled PT to increase their independence and safety with mobility to allow discharge to the venue listed below.      Follow Up Recommendations SNF    Equipment Recommendations  None recommended by PT    Recommendations for Other Services OT consult     Precautions / Restrictions Precautions Precautions: Fall Precaution Comments: left visual field deficits; left inattention Restrictions Weight Bearing Restrictions: No      Mobility  Bed Mobility Overal bed mobility: Needs Assistance Bed Mobility: Supine to Sit;Sit to Supine     Supine to sit: HOB elevated;Min assist Sit to supine: Min assist   General bed mobility comments: Cues for technique with increased time and effort, pt uses bed rail.  Assist to elevate trunk and to scoot to EOB using bed pad.  Transfers                 General transfer comment: did not attempt for pt/therapist safety  Ambulation/Gait                Stairs            Wheelchair Mobility    Modified Rankin (Stroke Patients Only) Modified Rankin (Stroke Patients Only) Pre-Morbid Rankin Score:  Severe disability Modified Rankin: Severe disability     Balance Overall balance assessment: Needs assistance Sitting-balance support: Single extremity supported;Feet supported Sitting balance-Leahy Scale: Poor Sitting balance - Comments: Posterior lean which pt can correct with verbal cues to lean forward.  Rt UE supported, pt not functionally using Lt UE.    Postural control: Posterior lean                                   Pertinent Vitals/Pain Pain Assessment: Faces Faces Pain Scale: Hurts little more Pain Location: head (staples) Pain Descriptors / Indicators: Discomfort;Nagging Pain Intervention(s): Limited activity within patient's tolerance;Monitored during session    Home Living Family/patient expects to be discharged to:: Private residence Living Arrangements: Spouse/significant other   Type of Home: House Home Access: Other (comment) (platform lift from garage into home)     Home Layout: One level Home Equipment: Walker - 2 wheels;Cane - single point;Hospital bed;Wheelchair - power Additional Comments: Wife physically disabled and not able to provide physical assist.    Prior Function Level of Independence: Needs assistance   Gait / Transfers Assistance Needed: Per review of chart pt non ambulatory ~1 wk.  Pt reports he has not been OOB at SNF.  ADL's / Homemaking Assistance Needed: Needs assist with bathing, dressing, and assist with feeding due to tremor.  Hand Dominance   Dominant Hand: Right    Extremity/Trunk Assessment   Upper Extremity Assessment: Defer to OT evaluation (Imp fine and gross coordination and strength LUE)           Lower Extremity Assessment: RLE deficits/detail;LLE deficits/detail RLE Deficits / Details: 3+ pitting edema Bil LEs.  DF 3/5, otherwise strength grossly 4/5 LLE Deficits / Details: 3+ pitting edema Bil LEs. Strength grossly 4/5     Communication   Communication: No difficulties  Cognition  Arousal/Alertness: Awake/alert Behavior During Therapy: WFL for tasks assessed/performed Overall Cognitive Status: Impaired/Different from baseline Area of Impairment: Awareness;Problem solving;Following commands;Safety/judgement       Following Commands: Follows multi-step commands inconsistently Safety/Judgement: Decreased awareness of safety;Decreased awareness of deficits Awareness: Emergent Problem Solving: Slow processing;Decreased initiation;Requires verbal cues General Comments: Pt does not initiate postural correction with posterior lean sitting EOB.  Follows commands after they are repeated multiple times.    General Comments      Exercises General Exercises - Lower Extremity Ankle Circles/Pumps: AROM;Both;10 reps;Seated Long Arc Quad: AROM;Both;10 reps;Seated Straight Leg Raises: AROM;Both;10 reps;Supine      Assessment/Plan    PT Assessment Patient needs continued PT services  PT Diagnosis Difficulty walking;Generalized weakness;Acute pain   PT Problem List Decreased strength;Decreased range of motion;Decreased activity tolerance;Decreased balance;Decreased mobility;Decreased coordination;Decreased cognition;Decreased knowledge of use of DME;Decreased safety awareness;Decreased knowledge of precautions;Impaired sensation;Pain  PT Treatment Interventions DME instruction;Gait training;Stair training;Functional mobility training;Therapeutic activities;Therapeutic exercise;Balance training;Neuromuscular re-education;Cognitive remediation;Wheelchair mobility training;Patient/family education   PT Goals (Current goals can be found in the Care Plan section) Acute Rehab PT Goals Patient Stated Goal: to be able to stand up and take two steps PT Goal Formulation: With patient Time For Goal Achievement: 02/03/16 Potential to Achieve Goals: Good    Frequency Min 3X/week   Barriers to discharge Decreased caregiver support      Co-evaluation               End of  Session   Activity Tolerance: Patient limited by fatigue Patient left: in bed;with call bell/phone within reach;with bed alarm set;with SCD's reapplied Nurse Communication: Mobility status;Need for lift equipment         Time: 1454-1515 PT Time Calculation (min) (ACUTE ONLY): 21 min   Charges:   PT Evaluation $PT Eval High Complexity: 1 Procedure     PT G Codes:       Collie Siad PT, DPT  Pager: [phone] Phone: [phone] 01/20/2016, 4:23 PM

## 2016-01-20 NOTE — ED Notes (Signed)
Patient transported to CT 

## 2016-01-20 NOTE — Progress Notes (Signed)
Patient ID: [name], male   DOB: Jun 29, 1941, 75 y.o.   MRN: [id number]  PROGRESS NOTE    [name]  DQQ:[id number] DOB: 1941-03-04 DOA: 01/19/2016  PCP: Beatris Si   Brief Narrative:   Patient admitted after midnight. Please refer to admission note done 01/20/2016.  75 y.o. male with past medical history significant for COPD, chronic respiratory failure on 2 L nasal cannula oxygen, chronic diastolic CHF, NSCLC with metastases (s/p RU lobectomy/radiation/chemotherapy), brain metastases (s/p of craniotomy on 01/16/16), hypertension, stroke, seizure, BPH, depression, anxiety, GERD who presented to [facility] ED with shortness of breath and cough.  In ED, blood pressure 86/63 but has improved with IV fluids to 150/70. Patient was afebrile. Blood work was notable for hemoglobin of 8.4, hemoglobin was 10.7 on his recent discharge 01/17/2016. Platelet count was 115, potassium 3.4, normal creatinine. CT chest showed clusters of nodularity in the right middle lobe and lingula which are new from prior study and most likely represent pneumonia. Metastatic disease is much less likely given rapid interval development of the nodules. CT head showed postoperative changes in the right posterior parietal region with a right posterior parietal intraparenchymal and subdural hematomas, no developing mass effect.    Assessment & Plan:   Principal Problem:   HCAP (healthcare-associated pneumonia) / acute respiratory failure with hypoxia - Based on CT chest, findings likely representing developing pneumonia - Considering patient's recent hospitalization he is treated with broad-spectrum antibiotics, cefepime and vancomycin for possible healthcare associated pneumonia - Strep pneumonia is negative - Blood cultures, HIV are pending, Legionella is pending - Continue nebulizer treatments, changed to every 4 hours scheduled regimen and also placed order for every 2 hours as needed DuoNeb for shortness of  breath or wheezing - May continue budesonide nebulizer twice daily as well  Active problems: Right parietal occipital metastatic lung cancer  - S/p Stereotactic right parietal occipital craniectomy for resection of occipital met  - Continue Decadron 4 mg by mouth 4 times a day - Continue Keppra for seizures - Patient was seen by palliative care in past so order placed for palliative care consultation for this hospital stay     DVT prophylaxis: SCDs bilaterally  Code Status: full code  Family Communication: No family at the bedside Disposition Plan: Home once medically stable, because of his immunosuppressed state and history of cancer I anticipate a longer hospital stay   Consultants:   None  Procedures:   None  Antimicrobials:   Cefepime and vancomycin 01/20/2016 -->   Subjective: No overnight events.  Objective: Filed Vitals:   01/20/16 0730 01/20/16 0753 01/20/16 0843 01/20/16 0931  BP: 139/64  146/89   Pulse: 55  90 91  Temp:  98.3 F (36.8 C) 98.4 F (36.9 C)   TempSrc:  Oral Oral   Resp: _0 Height:   _1  (1.753 m)   Weight:   75.116 kg (165 lb 9.6 oz)   SpO2: 100%  100% 100%    Intake/Output Summary (Last 24 hours) at 01/20/16 0959 Last data filed at 01/20/16 0708  Gross per 24 hour  Intake   1050 ml  Output    400 ml  Net    650 ml   Filed Weights   01/20/16 0843  Weight: 75.116 kg (165 lb 9.6 oz)    Examination:  General exam: Appears calm and comfortable  Respiratory system: Rhonchorous throughout Cardiovascular system: S1 & S2 heard, Regular rhythm Gastrointestinal system: Abdomen is nondistended,  soft and nontender. No organomegaly or masses felt. Normal bowel sounds heard. Central nervous system: Alert and oriented. No focal neurological deficits. Extremities: Lower extremity pitting edema +1 appreciated, pulses palpable Skin: Scattered ecchymosis Psychiatry: Judgement and insight appear normal. Mood & affect appropriate.    Data Reviewed: I have personally reviewed following labs and imaging studies  CBC:  Recent Labs Lab 01/14/16 0500 01/15/16 0430 01/16/16 0505 01/17/16 1211 01/20/16 0001  WBC 5.6  --  5.8 7.1 6.2  NEUTROABS  --   --   --  5.0 4.9  HGB 8.9*  --  8.1* 10.7* 8.4*  HCT 27.8* 27.6* 24.5* 33.1* 26.0*  MCV 93.9  --  93.5 93.8 94.2  PLT 121*  --  115* 117* 952*   Basic Metabolic Panel:  Recent Labs Lab 01/14/16 0500 01/16/16 0505 01/17/16 1211 01/20/16 0001  NA 133* 133* 132* 133*  K 4.1 4.0 3.8 3.4*  CL 92* 93* 93* 97*  CO2 36* 34* 33* 29  GLUCOSE 114* 122* 85 179*  BUN 21* 28* 19 16  CREATININE 0.69 0.78 0.65 0.71  CALCIUM 8.0* 7.8* 8.7* 7.9*  MG  --  2.4  --   --    GFR: Estimated Creatinine Clearance: 83.5 mL/min (by C-G formula based on Cr of 0.71). Liver Function Tests:  Recent Labs Lab 01/20/16 0001  AST 29  ALT 28  ALKPHOS 47  BILITOT 0.5  PROT 4.4*  ALBUMIN 2.5*   No results for input(s): LIPASE, AMYLASE in the last 168 hours. No results for input(s): AMMONIA in the last 168 hours. Coagulation Profile:  Recent Labs Lab 01/20/16 0342  INR 0.98   Cardiac Enzymes: No results for input(s): CKTOTAL, CKMB, CKMBINDEX, TROPONINI in the last 168 hours. BNP (last 3 results) No results for input(s): PROBNP in the last 8760 hours. HbA1C: No results for input(s): HGBA1C in the last 72 hours. CBG:  Recent Labs Lab 01/15/16 1649 01/15/16 2141 01/16/16 0617 01/16/16 1113 01/16/16 1623  GLUCAP 153* 119* 120* 145* 123*   Lipid Profile: No results for input(s): CHOL, HDL, LDLCALC, TRIG, CHOLHDL, LDLDIRECT in the last 72 hours. Thyroid Function Tests: No results for input(s): TSH, T4TOTAL, FREET4, T3FREE, THYROIDAB in the last 72 hours. Anemia Panel: No results for input(s): VITAMINB12, FOLATE, FERRITIN, TIBC, IRON, RETICCTPCT in the last 72 hours. Urine analysis:    Component Value Date/Time   COLORURINE YELLOW 01/20/2016 0027   APPEARANCEUR  CLOUDY* 01/20/2016 0027   LABSPEC 1.017 01/20/2016 0027   PHURINE 7.0 01/20/2016 0027   GLUCOSEU NEGATIVE 01/20/2016 0027   HGBUR NEGATIVE 01/20/2016 0027   BILIRUBINUR NEGATIVE 01/20/2016 0027   KETONESUR NEGATIVE 01/20/2016 0027   PROTEINUR NEGATIVE 01/20/2016 0027   UROBILINOGEN 1.0 11/11/2014 0900   NITRITE NEGATIVE 01/20/2016 0027   LEUKOCYTESUR NEGATIVE 01/20/2016 0027   Sepsis Labs: _0 (procalcitonin:4,lacticidven:4)   Recent Results (from the past 240 hour(s))  Culture, Urine     Status: None   Collection Time: 01/14/16  3:37 PM  Result Value Ref Range Status   Specimen Description URINE, CATHETERIZED  Final   Special Requests NONE  Final   Culture NO GROWTH  Final   Report Status 01/15/2016 FINAL  Final      Radiology Studies: Dg Chest 2 View 01/17/2016 No active cardiopulmonary disease. Electronically Signed   By: Marybelle Killings M.D.   On: 01/17/2016 13:16   Ct Head Wo Contrast 01/20/2016  Postoperative changes in the right posterior parietal region with right posterior parietal intraparenchymal  and subdural hematomas demonstrating typical evolution ule changes since previous study. No evidence of progression. No developing mass effect. Electronically Signed   By: Lucienne Capers M.D.   On: 01/20/2016 01:12   Ct Chest Wo Contrast 01/20/2016 Clusters of nodularity in the right middle lobe and lingula, new from prior study and most likely representing pneumonia. Metastatic disease is much less likely given rapid interval development of this nodules. Clinical correlation and follow-up recommended. Stable postsurgical changes of right upper lobectomy and stable small bilateral pleural effusions. Electronically Signed   By: Anner Crete M.D.   On: 01/20/2016 01:30      Scheduled Meds: . aspirin EC  81 mg Oral Daily  . budesonide  0.25 mg Nebulization BID  . ceFEPime (MAXIPIME) IV  1 g Intravenous Q8H  . dexamethasone  4 mg Oral QID  .  dextromethorphan-guaiFENesin  1 tablet Oral BID  . docusate sodium  100 mg Oral BID  . famotidine  40 mg Oral BID  . feeding supplement (ENSURE ENLIVE)  237 mL Oral BID BM  . guaiFENesin  600 mg Oral BID  . ipratropium-albuterol  3 mL Nebulization TID  . isosorbide mononitrate  30 mg Oral Daily  . levETIRAcetam  1,500 mg Oral BID  . potassium chloride  10 mEq Oral Once  . pramipexole  1 mg Oral QHS  . senna  2 tablet Oral Daily  . sodium chloride  2 g Oral TID WC  . tamsulosin  0.4 mg Oral Daily  . vancomycin  1,000 mg Intravenous Q12H  . vitamin B-12  500 mcg Oral Daily   Continuous Infusions: . sodium chloride 1,000 mL (01/20/16 0351)     LOS: 0 days   Greater than 50% of the time spent on counseling and coordinating the care.   Leisa Lenz, MD [facility] Pager [phone]  If 7PM-7AM, please contact night-coverage [email or web address] Password [email or web address] 01/20/2016, 9:59 AM

## 2016-01-20 NOTE — ED Notes (Signed)
Attempted report x1. 

## 2016-01-20 NOTE — ED Notes (Signed)
Suction set up at bedside per spouse request; pt suction self as needed

## 2016-01-20 NOTE — Consult Note (Signed)
WOC requested for surgical site care, patient known to this [facility] from his recent DC at which time I saw him for a skin tear to the left forearm.  I will reestablish wound care for this.  Previously no topical care ordered per neurosurgery to the craniotomy site. Would suggest notify neurosurgery of patient admission for any topical care for the surgical site since surgery date so recent.   Discussed POC with patient and bedside nurse.  Re consult if needed, will not follow at this time. Thanks  Kourtnee Lahey [employer], RN,CNS, [place] [phone])

## 2016-01-21 LAB — LEGIONELLA PNEUMOPHILA SEROGP 1 UR AG: L. pneumophila Serogp 1 Ur Ag: NEGATIVE

## 2016-01-21 LAB — URINE CULTURE

## 2016-01-21 NOTE — Progress Notes (Signed)
RT placed pt on home regimen of 2L [place] due to stable sats

## 2016-01-21 NOTE — Evaluation (Signed)
Occupational Therapy Evaluation Patient Details Name: [name] MRN: [id number] DOB: 04/30/1941 Today's Date: 01/21/2016    History of Present Illness Patient is a 75 y/o male with recurrent lung ca with brain mets, COPD, restless leg syndrome, anxiety, s/p craniotomy with resection of a right occipital mass. Pt presents this admission with SOB and cough. Suspect developing pneumonia based on CT chest.     Clinical Impression   Pt admitted with above. He demonstrates the below listed deficits.  He currently requires mod - max A for ADLs and max A +2 for functional mobility.  Pt was residing at [facility] for rehab with plans to return.  All further OT needs can be met at [facility].  Acute OT will sign off at this time.     Follow Up Recommendations  SNF;Supervision/Assistance - 24 hour    Equipment Recommendations  None recommended by OT    Recommendations for Other Services       Precautions / Restrictions Precautions Precautions: Fall Precaution Comments: left visual field deficits; left inattention      Mobility Bed Mobility Overal bed mobility: Needs Assistance Bed Mobility: Supine to Sit;Sit to Supine     Supine to sit: Mod assist;HOB elevated Sit to supine: Min assist   General bed mobility comments: Pt requires assist to lift trunk and scoot to EOB   Transfers                 General transfer comment: Unable to safely attempt with +1 assist     Balance Overall balance assessment: Needs assistance Sitting-balance support: Feet supported;Single extremity supported Sitting balance-Leahy Scale: Poor Sitting balance - Comments: Pt requires min - mod A for EOB sitting.  He demonstrates posterior bias                                     ADL Overall ADL's : Needs assistance/impaired Eating/Feeding: Minimal assistance;Bed level   Grooming: Wash/dry hands;Wash/dry face;Oral care;Brushing hair;Minimal assistance;Bed level   Upper Body Bathing:  Moderate assistance;Sitting;Bed level   Lower Body Bathing: Maximal assistance;Bed level   Upper Body Dressing : Maximal assistance;Sitting;Bed level   Lower Body Dressing: Total assistance;Bed level   Toilet Transfer: Total assistance Toilet Transfer Details (indicate cue type and reason): unable to safely attempt with +1 assist  Toileting- Clothing Manipulation and Hygiene: Total assistance;Bed level       Functional mobility during ADLs: Maximal assistance;+2 for physical assistance General ADL Comments: Pt is impulsive with Lt sided inattention, poor awareness of deficits and safety      Vision Additional Comments: Pt with residual Lt visual field deficit and Lt inattention    Perception Perception Perception Tested?: Yes Perception Deficits: Inattention/neglect Inattention/Neglect: Does not attend to left visual field;Does not attend to left side of body (does not consistently attend )   Praxis      Pertinent Vitals/Pain Pain Assessment: Faces Faces Pain Scale: Hurts little more Pain Location: posterior head/neck/back  Pain Descriptors / Indicators: Grimacing Pain Intervention(s): Monitored during session     Hand Dominance Right   Extremity/Trunk Assessment Upper Extremity Assessment Upper Extremity Assessment: LUE deficits/detail LUE Deficits / Details: Pt with dysmetria.  Lt UE shoulder flexion to ~110, but decreased control and decreased proprioceptive awareness  LUE Sensation: decreased proprioception LUE Coordination: decreased fine motor;decreased gross motor   Lower Extremity Assessment Lower Extremity Assessment: Defer to PT evaluation   Cervical /  Trunk Assessment Cervical / Trunk Assessment: Other exceptions Cervical / Trunk Exceptions: impaired postural control    Communication Communication Communication: No difficulties   Cognition Arousal/Alertness: Awake/alert Behavior During Therapy:  (irritable ) Overall Cognitive Status:  Impaired/Different from baseline Area of Impairment: Attention;Following commands;Safety/judgement;Awareness;Problem solving   Current Attention Level: Sustained   Following Commands: Follows multi-step commands inconsistently Safety/Judgement: Decreased awareness of safety;Decreased awareness of deficits   Problem Solving: Difficulty sequencing;Requires verbal cues;Requires tactile cues General Comments: Pt  very irritable, difficult to redirect.  Poor awareness of deficits, mod A for problem solving   General Comments       Exercises Exercises: Other exercises Other Exercises Other Exercises: Pt performed functional reaching with Lt UE and min facilitation    Shoulder Instructions      Home Living Family/patient expects to be discharged to:: Skilled nursing facility                                 Additional Comments: Pt was residing at [facility]      Prior Functioning/Environment Level of Independence: Needs assistance  Gait / Transfers Assistance Needed: Pt was requiring max A+2 since last admission  ADL's / Homemaking Assistance Needed: Pt has required max - total A per chart during last admission         OT Diagnosis: Generalized weakness;Cognitive deficits;Disturbance of vision;Hemiplegia non-dominant side;Ataxia   OT Problem List: Decreased strength;Decreased activity tolerance;Impaired balance (sitting and/or standing);Decreased cognition;Decreased safety awareness;Decreased knowledge of use of DME or AE;Decreased knowledge of precautions;Decreased coordination;Impaired vision/perception   OT Treatment/Interventions:      OT Goals(Current goals can be found in the care plan section) Acute Rehab OT Goals Patient Stated Goal: to go home one day  OT Goal Formulation: All assessment and education complete, DC therapy  OT Frequency:     Barriers to D/C:            Co-evaluation              End of Session Equipment Utilized  During Treatment: Oxygen Nurse Communication: Mobility status  Activity Tolerance: Patient tolerated treatment well Patient left: in bed;with call bell/phone within reach;with bed alarm set   Time: 1332-1411 OT Time Calculation (min): 39 min Charges:  OT General Charges $OT Visit: 1 Procedure OT Evaluation $OT Eval Moderate Complexity: 1 Procedure OT Treatments $Therapeutic Activity: 8-22 mins $Neuromuscular Re-education: 8-22 mins G-Codes:    Conarpe, Wendi M 04-Feb-2016, 3:32 PM

## 2016-01-21 NOTE — Clinical Documentation Improvement (Addendum)
Internal Medicine    "CT of Head  showed postoperative changes in the right posterior parietal region with a right posterior parietal intraparenchymal and subdural hematomas, no developing mass effect. " Please provide diagnosis if appropriate for this admission .   Thank you     Possible Clinical Conditions associated with below indicators     Subdural hematoma   Secondary malignant Neoplasm of the brain    Other Condition   Cannot Clinically Determine   Supporting Information: s/p Craniotomy for Occipital Mets, c/o head pain on admission    Evaluation : CT of the head   Please exercise your independent, professional judgment when responding. A specific answer is not anticipated or expected.   Thank You,  [place] [phone]    Thank you for your input. Additional change in progress note 01/27/2016.   Leisa Lenz

## 2016-01-21 NOTE — Progress Notes (Signed)
Md notified of pt's wife wanted him to call her so that she can find out when he'll be discharged. Pt's wife needs to know so she'll know how long to hold & keep paying for pt's bed at his SNF. Pt & wife also concerned about when the pt can get the staples taken out of his head. MD notified of that as well. Will continue to monitor the pt & keep him informed.  Hoover Brunette, RN

## 2016-01-21 NOTE — Progress Notes (Signed)
I verified d/c summary from Dr. Vertell Limber recommending SNF to remove staples 2 weeks post-op (surgery was on 6/27). Also spoke with Junie Panning, medical assistant with neurosurgical service who confirms okay to remove staples. Order placed and updated RN. Incision assessed this morning healing nicely without any redness, edema, drainage.   Vinie Sill, NP Palliative Medicine Team Pager # [phone] (M-F 8a-5p) Team Phone # [phone] (Nights/Weekends)

## 2016-01-21 NOTE — Progress Notes (Signed)
PT Cancellation Note  Patient Details Name: DEVESH MONFORTE MRN: [id number] DOB: 1940-10-28   Cancelled Treatment:    Reason Eval/Treat Not Completed: Medical issues which prohibited therapy.  Critical labs and will check later as time and pt allow.   Ramond Dial 01/21/2016, 9:07 AM    Mee Hives, PT MS Acute Rehab Dept. Number: [place] and [place]

## 2016-01-21 NOTE — Progress Notes (Signed)
Patient continues to refuse removal of staples from head wound without a medical professional from Neurosurgery present.  Patient discussing with wife, via telephone, fear and anxiety related to pain and complications.  Teaching reinforced by nursing with interventions to relieve pain and anxiety.

## 2016-01-21 NOTE — Clinical Social Work Note (Signed)
CSW met with patient. Wife at bedside. Wife confirmed that patient will return to [facility] and she is holding a room for him now. Patient's wife wanting to know when he will discharge so she can know if she needs to continue holding the room or discontinue and get another room when he goes back. CSW told patient's wife to wait on MD to see him to determine further course of treatment.  Patient states that he is not leaving until his staples are out.  Dayton Scrape, [place]

## 2016-01-21 NOTE — Clinical Documentation Improvement (Addendum)
Internal Medicine  Please clarify if the following diagnosis, Sepsis was:   Present at the time of admission (POA)  NOT present at the time of admission and it developed during the inpatient stay  Unable to clinically determine whether the condition was present on admission.  Unknown   Supporting Information: HCAP/ Lactic Acid 2.18/2.00, Elev resp    Treatment : IV Vancomycin , Cefepime    Please exercise your independent, professional judgment when responding. A specific answer is not anticipated or expected.   Thank You,  [place] [phone]   Thank you for your input  Additional change in progress note 7/18, under HCAP i put sepsis ruled out. Thank you Leisa Lenz

## 2016-01-21 NOTE — Progress Notes (Signed)
RT came to do neb tx- pt unavailable at this time. Pt states he wants RT to come back later

## 2016-01-21 NOTE — NC FL2 (Signed)
[place] LEVEL OF CARE SCREENING TOOL     IDENTIFICATION  Patient Name: [name] Birthdate: [birth year] Sex: male Admission Date (Current Location): 01/19/2016  [facility] and [place] Number:  Herbalist and Address:  [facility] [place]. [facility], [place] [address], [place],  [address]      Provider Number: [id number]  Attending Physician Name and Address:  Bonnell Public, MD  Relative Name and Phone Number:       Current Level of Care: Hospital Recommended Level of Care: [place] Prior Approval Number:    Date Approved/Denied:   PASRR Number: [id number]  Discharge Plan: SNF    Current Diagnoses: Patient Active Problem List   Diagnosis Date Noted  . HCAP (healthcare-associated pneumonia) 01/20/2016  . BPH (benign prostatic hyperplasia) 01/20/2016  . Anemia 01/20/2016  . Thrombocytopenia ([place]) 01/15/2016  . Hyponatremia 01/15/2016  . Chronic respiratory failure with hypoxia ([place]) 01/14/2016  . DNR (do not resuscitate) discussion   . Malnutrition of moderate degree 01/12/2016  . Palliative care encounter   . Acute confusion   . S/P craniotomy 01/06/2016  . Acute on chronic respiratory failure ([place]) 01/06/2016  . COPD exacerbation ([place]) 12/30/2015  . Acute exacerbation of COPD with asthma ([place]) 12/30/2015  . Acute exacerbation of CHF (congestive heart failure) ([place]) 12/30/2015  . Leukocytosis 12/30/2015  . COPD mixed type ([place]) 12/25/2015  . Edema 12/25/2015  . New onset seizure ([place])   . Malignant neoplasm of lung ([place])   . Benign essential HTN   . Acute left hemiparesis ([place])   . Left hemiparesis ([place]) 12/13/2015  . Respiratory failure ([place]) 12/13/2015  . Brain metastases ([place])   . Volume overload 09/26/2015  . Hand foot syndrome 09/26/2015  . Malignant neoplasm of hilus of lung ([place]) 09/25/2015  . Unintentional weight loss 09/10/2015  . Hypertension 09/10/2015  . Encounter for antineoplastic  chemotherapy 07/20/2015  . 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 Metastatic lung cancer (metastasis from lung to other site) [facility])   . Poor venous access   . Epistaxis 05/30/2015  . Extravasation, infusion or chemotherapeutic agent 05/09/2015  . Hypokalemia 04/02/2015  . Constipation 01/29/2015  . Insomnia 01/29/2015  . Cancer associated pain 12/30/2014  . Rash 12/30/2014  . Syncope 12/30/2014  . Encounter for antineoplastic immunotherapy 12/16/2014  . UTI (lower urinary tract infection) 11/11/2014  . Sepsis ([place]) 11/11/2014  . Left arm cellulitis   . Blood poisoning ([place])   . Dehydration 10/12/2014  . Renal stone 10/05/2014  . Pyelonephritis 10/05/2014  . Chemotherapy induced neutropenia ([place]) 09/18/2014  . Dyspnea 09/11/2014  . Brain metastasis ([place]) 09/11/2014  . Tachycardia 06/21/2014  . Carotid stenosis 05/01/2014  . Aftercare following surgery of the circulatory system 05/01/2014  . Weakness-Hand and  Legs 05/01/2014  . Coronary atherosclerosis of native coronary artery 03/07/2014  . Essential hypertension, benign 03/07/2014  . Tobacco use disorder 03/07/2014  . Neoplasm of upper lobe of right lung ([place]) 10/26/2012  . Peripheral vascular disease, unspecified ([place])   . COPD (chronic obstructive pulmonary disease) ([place]) 11/16/2011  . Occlusion and stenosis of carotid artery without mention of cerebral infarction 05/12/2011    Orientation RESPIRATION BLADDER Height & Weight     Self, Time, Situation, Place  O2 (Nasal canula 2 L) Continent, External catheter Weight: 168 lb 14.4 oz (76.613 kg) Height:  '5\' 9"'$  (175.3 cm)  BEHAVIORAL SYMPTOMS/MOOD NEUROLOGICAL BOWEL NUTRITION STATUS   (None)  (  Brain metastasis, tremors in legs.) Continent Diet (Heart healthy)  AMBULATORY STATUS COMMUNICATION OF NEEDS Skin   Extensive Assist Verbally Other (Comment), Surgical wounds (Ecchymosis: Bilateral arm and leg. Skin tear: Left arm and elbow. Closed incision:  Posterior head. Open or dehisced wound/incision: Left elbow.)                       Personal Care Assistance Level of Assistance  Bathing, Feeding, Dressing Bathing Assistance: Limited assistance Feeding assistance: Limited assistance Dressing Assistance: Limited assistance     Functional Limitations Info  Sight, Hearing, Speech Sight Info: Adequate Hearing Info: Adequate Speech Info: Adequate    SPECIAL CARE FACTORS FREQUENCY  PT (By licensed PT), Blood pressure     PT Frequency: 5 x week              Contractures Contractures Info: Not present    Additional Factors Info  Code Status, Allergies, Isolation Precautions, Suctioning Needs Code Status Info: Full Allergies Info: NKDA     Isolation Precautions Info: Droplet precaution Suctioning Needs: Oral   Current Medications (01/21/2016):  This is the current hospital active medication list Current Facility-Administered Medications  Medication Dose Route Frequency Provider Last Rate Last Dose  . 0.9 %  sodium chloride infusion   Intravenous Continuous Robbie Lis, MD 75 mL/hr at 01/21/16 0908    . antiseptic oral rinse (CPC / CETYLPYRIDINIUM CHLORIDE 0.05%) solution 7 mL  7 mL Mouth Rinse BID Robbie Lis, MD   7 mL at 01/21/16 0906  . budesonide (PULMICORT) nebulizer solution 0.25 mg  0.25 mg Nebulization BID Ivor Costa, MD   0.25 mg at 01/21/16 0926  . ceFEPIme (MAXIPIME) 1 g in dextrose 5 % 50 mL IVPB  1 g Intravenous Q8H Ivor Costa, MD   1 g at 01/21/16 0523  . clonazePAM (KLONOPIN) tablet 0.5 mg  0.5 mg Oral BID PRN Ivor Costa, MD   0.5 mg at 01/20/16 1944  . dexamethasone (DECADRON) tablet 4 mg  4 mg Oral QID Ivor Costa, MD   4 mg at 01/21/16 0523  . dextromethorphan-guaiFENesin (MUCINEX DM) 30-600 MG per 12 hr tablet 1 tablet  1 tablet Oral BID Ivor Costa, MD   1 tablet at 01/21/16 0856  . docusate sodium (COLACE) capsule 100 mg  100 mg Oral BID Ivor Costa, MD   100 mg at 01/21/16 0854  . famotidine (PEPCID)  tablet 40 mg  40 mg Oral BID Ivor Costa, MD   40 mg at 01/21/16 0854  . feeding supplement (ENSURE ENLIVE) (ENSURE ENLIVE) liquid 237 mL  237 mL Oral BID BM Ivor Costa, MD   237 mL at 01/20/16 1000  . HYDROcodone-acetaminophen (NORCO) 7.5-325 MG per tablet 1 tablet  1 tablet Oral Q6H PRN Ivor Costa, MD   1 tablet at 01/20/16 2217  . ipratropium-albuterol (DUONEB) 0.5-2.5 (3) MG/3ML nebulizer solution 3 mL  3 mL Nebulization Q4H Robbie Lis, MD   3 mL at 01/21/16 0918  . ipratropium-albuterol (DUONEB) 0.5-2.5 (3) MG/3ML nebulizer solution 3 mL  3 mL Nebulization Q2H PRN Robbie Lis, MD      . isosorbide mononitrate (IMDUR) 24 hr tablet 30 mg  30 mg Oral Daily Ivor Costa, MD   30 mg at 01/21/16 0854  . levETIRAcetam (KEPPRA) tablet 1,500 mg  1,500 mg Oral BID Ivor Costa, MD   1,500 mg at 01/21/16 0855  . potassium chloride 20 MEQ/15ML (10%) solution 10 mEq  10 mEq  Oral Once Ivor Costa, MD      . pramipexole (MIRAPEX) tablet 1 mg  1 mg Oral QHS Ivor Costa, MD   1 mg at 01/20/16 2217  . senna (SENOKOT) tablet 17.2 mg  2 tablet Oral Daily Ivor Costa, MD   17.2 mg at 01/21/16 0854  . sodium chloride (OCEAN) 0.65 % nasal spray 1 spray  1 spray Each Nare PRN Ivor Costa, MD      . sodium chloride tablet 2 g  2 g Oral TID WC Ivor Costa, MD   2 g at 01/21/16 0856  . tamsulosin (FLOMAX) capsule 0.4 mg  0.4 mg Oral Daily Ivor Costa, MD   0.4 mg at 01/21/16 0855  . triamcinolone cream (KENALOG) 0.1 % 1 application  1 application Topical BID PRN Ivor Costa, MD      . vancomycin (VANCOCIN) IVPB 1000 mg/200 mL premix  1,000 mg Intravenous Q12H Erenest Blank, RPH   1,000 mg at 01/21/16 0906  . vitamin B-12 (CYANOCOBALAMIN) tablet 500 mcg  500 mcg Oral Daily Ivor Costa, MD   500 mcg at 01/21/16 0855  . zolpidem (AMBIEN) tablet 5 mg  5 mg Oral QHS PRN Ivor Costa, MD   5 mg at 01/20/16 2216   Facility-Administered Medications Ordered in Other Encounters  Medication Dose Route Frequency Provider Last Rate Last Dose  . sodium  chloride 0.9 % injection 10 mL  10 mL Intracatheter PRN Curt Bears, MD   10 mL at 01/29/15 4193     Discharge Medications: Please see discharge summary for a list of discharge medications.  Relevant Imaging Results:  Relevant Lab Results:   Additional Information SS#: [id number]  Candie Chroman, LCSW

## 2016-01-21 NOTE — Progress Notes (Signed)
RN came in to take pt's staples out per the order. Pt refuses to let the RN take them out. Pt wants the doctor that put them in or someone from the neurosurgery department to take them out. Will continue to monitor the pt. [place] Brunette, RN

## 2016-01-21 NOTE — Progress Notes (Signed)
Patient ID: [name], male   DOB: [birth year], 75 y.o.   MRN: [id number]  PROGRESS NOTE    [name]  XZH:[id number] DOB: May 30, 1941 DOA: 01/19/2016  PCP: Ethel Rana  Brief Narrative:   Patient admitted after midnight. Please refer to admission note done 01/20/2016.  75 y.o. male with past medical history significant for COPD, chronic respiratory failure on 2 L nasal cannula oxygen, chronic diastolic CHF, NSCLC with metastases (s/p RU lobectomy/radiation/chemotherapy), brain metastases (s/p of craniotomy on 01/16/16), hypertension, stroke, seizure, BPH, depression, anxiety, GERD who presented to [facility] ED with shortness of breath and cough.  In ED, blood pressure 86/63 but has improved with IV fluids to 150/70. Patient was afebrile. Blood work was notable for hemoglobin of 8.4, hemoglobin was 10.7 on his recent discharge 01/17/2016. Platelet count was 115, potassium 3.4, normal creatinine. CT chest showed clusters of nodularity in the right middle lobe and lingula which are new from prior study and most likely represent pneumonia. Metastatic disease is much less likely given rapid interval development of the nodules. CT head showed postoperative changes in the right posterior parietal region with a right posterior parietal intraparenchymal and subdural hematomas, no developing mass effect.  Subjective: Nil new complaints. Wants scalp staples removed. Discussed with the palliative care team, apparently, patient is resistant to palliative care input.  Assessment & Plan:   Principal Problem:   HCAP (healthcare-associated pneumonia) / acute respiratory failure with hypoxia - Based on CT chest, findings likely representing developing pneumonia - Considering patient's recent hospitalization he is treated with broad-spectrum antibiotics, cefepime and vancomycin for possible healthcare associated pneumonia - Strep pneumonia is negative - Blood cultures, HIV are pending, Legionella is  pending - Continue nebulizer treatments, changed to every 4 hours scheduled regimen and also placed order for every 2 hours as needed DuoNeb for shortness of breath or wheezing - May continue budesonide nebulizer twice daily as well  Active problems: Right parietal occipital metastatic lung cancer  - S/p Stereotactic right parietal occipital craniectomy for resection of occipital met  - Continue Decadron 4 mg by mouth 4 times a day - Continue Keppra for seizures - Patient was seen by palliative care in past so order placed for palliative care consultation for this hospital stay     DVT prophylaxis: SCDs bilaterally  Code Status: full code  Family Communication: No family at the bedside Disposition Plan: Home once medically stable, because of his immunosuppressed state and history of cancer I anticipate a longer hospital stay   Consultants:   Palliative care  Procedures:   None  Antimicrobials:   Cefepime and vancomycin 01/20/2016 -->   Subjective: No overnight events.  Objective: Filed Vitals:   01/21/16 0045 01/21/16 0412 01/21/16 0918 01/21/16 1221  BP:  120/75  110/73  Pulse:  79  97  Temp:  98.6 F (37 C)  98.6 F (37 C)  TempSrc:  Oral  Oral  Resp:  19    Height:      Weight:  76.613 kg (168 lb 14.4 oz)    SpO2: 100% 100% 100% 99%    Intake/Output Summary (Last 24 hours) at 01/21/16 1322 Last data filed at 01/21/16 0811  Gross per 24 hour  Intake 2850.42 ml  Output   1200 ml  Net 1650.42 ml   Filed Weights   01/20/16 0843 01/21/16 0412  Weight: 75.116 kg (165 lb 9.6 oz) 76.613 kg (168 lb 14.4 oz)    Examination:  General exam: Appears calm  and comfortable  Respiratory system: Rhonchorous throughout Cardiovascular system: S1 & S2 heard, Regular rhythm Gastrointestinal system: Abdomen is nondistended, soft and nontender. No organomegaly or masses felt. Normal bowel sounds heard. Central nervous system: Alert and oriented. No focal neurological  deficits. Extremities: Lower extremity pitting edema +1 appreciated, pulses palpable Skin: Scattered ecchymosis Psychiatry: Judgement and insight appear normal. Mood & affect appropriate.   Data Reviewed: I have personally reviewed following labs and imaging studies  CBC:  Recent Labs Lab 01/15/16 0430 01/16/16 0505 01/17/16 1211 01/20/16 0001  WBC  --  5.8 7.1 6.2  NEUTROABS  --   --  5.0 4.9  HGB  --  8.1* 10.7* 8.4*  HCT 27.6* 24.5* 33.1* 26.0*  MCV  --  93.5 93.8 94.2  PLT  --  115* 117* 035*   Basic Metabolic Panel:  Recent Labs Lab 01/16/16 0505 01/17/16 1211 01/20/16 0001  NA 133* 132* 133*  K 4.0 3.8 3.4*  CL 93* 93* 97*  CO2 34* 33* 29  GLUCOSE 122* 85 179*  BUN 28* 19 16  CREATININE 0.78 0.65 0.71  CALCIUM 7.8* 8.7* 7.9*  MG 2.4  --   --    GFR: Estimated Creatinine Clearance: 83.5 mL/min (by C-G formula based on Cr of 0.71). Liver Function Tests:  Recent Labs Lab 01/20/16 0001  AST 29  ALT 28  ALKPHOS 47  BILITOT 0.5  PROT 4.4*  ALBUMIN 2.5*   No results for input(s): LIPASE, AMYLASE in the last 168 hours. No results for input(s): AMMONIA in the last 168 hours. Coagulation Profile:  Recent Labs Lab 01/20/16 0342  INR 0.98   Cardiac Enzymes: No results for input(s): CKTOTAL, CKMB, CKMBINDEX, TROPONINI in the last 168 hours. BNP (last 3 results) No results for input(s): PROBNP in the last 8760 hours. HbA1C: No results for input(s): HGBA1C in the last 72 hours. CBG:  Recent Labs Lab 01/15/16 1649 01/15/16 2141 01/16/16 0617 01/16/16 1113 01/16/16 1623  GLUCAP 153* 119* 120* 145* 123*   Lipid Profile: No results for input(s): CHOL, HDL, LDLCALC, TRIG, CHOLHDL, LDLDIRECT in the last 72 hours. Thyroid Function Tests: No results for input(s): TSH, T4TOTAL, FREET4, T3FREE, THYROIDAB in the last 72 hours. Anemia Panel: No results for input(s): VITAMINB12, FOLATE, FERRITIN, TIBC, IRON, RETICCTPCT in the last 72 hours. Urine  analysis:    Component Value Date/Time   COLORURINE YELLOW 01/20/2016 0027   APPEARANCEUR CLOUDY* 01/20/2016 0027   LABSPEC 1.017 01/20/2016 0027   PHURINE 7.0 01/20/2016 0027   GLUCOSEU NEGATIVE 01/20/2016 0027   HGBUR NEGATIVE 01/20/2016 0027   BILIRUBINUR NEGATIVE 01/20/2016 0027   KETONESUR NEGATIVE 01/20/2016 0027   PROTEINUR NEGATIVE 01/20/2016 0027   UROBILINOGEN 1.0 11/11/2014 0900   NITRITE NEGATIVE 01/20/2016 0027   LEUKOCYTESUR NEGATIVE 01/20/2016 0027   Sepsis Labs: _0 (procalcitonin:4,lacticidven:4)   Recent Results (from the past 240 hour(s))  Culture, Urine     Status: None   Collection Time: 01/14/16  3:37 PM  Result Value Ref Range Status   Specimen Description URINE, CATHETERIZED  Final   Special Requests NONE  Final   Culture NO GROWTH  Final   Report Status 01/15/2016 FINAL  Final  Blood Culture (routine x 2)     Status: None (Preliminary result)   Collection Time: 01/20/16 12:01 AM  Result Value Ref Range Status   Specimen Description BLOOD RIGHT HAND  Final   Special Requests BOTTLES DRAWN AEROBIC AND ANAEROBIC 5ML  Final   Culture NO GROWTH <  24 HOURS  Final   Report Status PENDING  Incomplete  Blood Culture (routine x 2)     Status: None (Preliminary result)   Collection Time: 01/20/16 12:20 AM  Result Value Ref Range Status   Specimen Description BLOOD RIGHT HAND  Final   Special Requests BOTTLES DRAWN AEROBIC AND ANAEROBIC 5ML  Final   Culture NO GROWTH < 24 HOURS  Final   Report Status PENDING  Incomplete  Urine culture     Status: Abnormal   Collection Time: 01/20/16 12:27 AM  Result Value Ref Range Status   Specimen Description URINE, RANDOM  Final   Special Requests NONE  Final   Culture MULTIPLE SPECIES PRESENT, SUGGEST RECOLLECTION (A)  Final   Report Status 01/21/2016 FINAL  Final  MRSA PCR Screening     Status: None   Collection Time: 01/20/16  8:04 PM  Result Value Ref Range Status   MRSA by PCR NEGATIVE NEGATIVE Final     Comment:        The GeneXpert MRSA Assay (FDA approved for NASAL specimens only), is one component of a comprehensive MRSA colonization surveillance program. It is not intended to diagnose MRSA infection nor to guide or monitor treatment for MRSA infections.       Radiology Studies: Dg Chest 2 View 01/17/2016 No active cardiopulmonary disease. Electronically Signed   By: Marybelle Killings M.D.   On: 01/17/2016 13:16   Ct Head Wo Contrast 01/20/2016  Postoperative changes in the right posterior parietal region with right posterior parietal intraparenchymal and subdural hematomas demonstrating typical evolution ule changes since previous study. No evidence of progression. No developing mass effect. Electronically Signed   By: Lucienne Capers M.D.   On: 01/20/2016 01:12   Ct Chest Wo Contrast 01/20/2016 Clusters of nodularity in the right middle lobe and lingula, new from prior study and most likely representing pneumonia. Metastatic disease is much less likely given rapid interval development of this nodules. Clinical correlation and follow-up recommended. Stable postsurgical changes of right upper lobectomy and stable small bilateral pleural effusions. Electronically Signed   By: Anner Crete M.D.   On: 01/20/2016 01:30      Scheduled Meds: . antiseptic oral rinse  7 mL Mouth Rinse BID  . budesonide  0.25 mg Nebulization BID  . ceFEPime (MAXIPIME) IV  1 g Intravenous Q8H  . dexamethasone  4 mg Oral QID  . dextromethorphan-guaiFENesin  1 tablet Oral BID  . docusate sodium  100 mg Oral BID  . famotidine  40 mg Oral BID  . feeding supplement (ENSURE ENLIVE)  237 mL Oral BID BM  . ipratropium-albuterol  3 mL Nebulization Q4H  . isosorbide mononitrate  30 mg Oral Daily  . levETIRAcetam  1,500 mg Oral BID  . potassium chloride  10 mEq Oral Once  . pramipexole  1 mg Oral QHS  . senna  2 tablet Oral Daily  . sodium chloride  2 g Oral TID WC  . tamsulosin  0.4 mg Oral Daily  .  vancomycin  1,000 mg Intravenous Q12H  . vitamin B-12  500 mcg Oral Daily   Continuous Infusions: . sodium chloride 75 mL/hr at 01/21/16 0908     LOS: 1 day   Greater than 50% of the time spent on counseling and coordinating the care.   Bonnell Public, MD [facility] Pager [phone]  If 7PM-7AM, please contact night-coverage [email or web address] Password [email or web address] 01/21/2016, 1:22 PM

## 2016-01-22 ENCOUNTER — Inpatient Hospital Stay (HOSPITAL_COMMUNITY): Payer: Medicare Other

## 2016-01-22 DIAGNOSIS — I5033 Acute on chronic diastolic (congestive) heart failure: Secondary | ICD-10-CM

## 2016-01-22 DIAGNOSIS — J9621 Acute and chronic respiratory failure with hypoxia: Secondary | ICD-10-CM

## 2016-01-22 DIAGNOSIS — J441 Chronic obstructive pulmonary disease with (acute) exacerbation: Secondary | ICD-10-CM

## 2016-01-22 DIAGNOSIS — C3490 Malignant neoplasm of unspecified part of unspecified bronchus or lung: Secondary | ICD-10-CM

## 2016-01-22 LAB — RESPIRATORY PANEL BY PCR
ADENOVIRUS-RVPPCR: NOT DETECTED
Bordetella pertussis: NOT DETECTED
CORONAVIRUS 229E-RVPPCR: NOT DETECTED
CORONAVIRUS OC43-RVPPCR: NOT DETECTED
Chlamydophila pneumoniae: NOT DETECTED
Coronavirus HKU1: NOT DETECTED
Coronavirus NL63: NOT DETECTED
INFLUENZA A H1-RVPPCR: NOT DETECTED
INFLUENZA A-RVPPCR: NOT DETECTED
Influenza A H1 2009: NOT DETECTED
Influenza A H3: NOT DETECTED
Influenza B: NOT DETECTED
Metapneumovirus: NOT DETECTED
Mycoplasma pneumoniae: NOT DETECTED
PARAINFLUENZA VIRUS 1-RVPPCR: NOT DETECTED
PARAINFLUENZA VIRUS 4-RVPPCR: NOT DETECTED
Parainfluenza Virus 2: NOT DETECTED
Parainfluenza Virus 3: NOT DETECTED
RESPIRATORY SYNCYTIAL VIRUS-RVPPCR: NOT DETECTED
Rhinovirus / Enterovirus: NOT DETECTED

## 2016-01-22 LAB — GLUCOSE, CAPILLARY: Glucose-Capillary: 122 mg/dL — ABNORMAL HIGH (ref 65–99)

## 2016-01-22 LAB — BLOOD GAS, ARTERIAL
Acid-Base Excess: 3.1 mmol/L — ABNORMAL HIGH (ref 0.0–2.0)
Bicarbonate: 27.4 mEq/L — ABNORMAL HIGH (ref 20.0–24.0)
Drawn by: 281201
FIO2: 0.55
O2 SAT: 99.3 %
PO2 ART: 200 mmHg — AB (ref 80.0–100.0)
Patient temperature: 98.6
TCO2: 28.8 mmol/L (ref 0–100)
pCO2 arterial: 44.2 mmHg (ref 35.0–45.0)
pH, Arterial: 7.409 (ref 7.350–7.450)

## 2016-01-22 LAB — LEVETIRACETAM LEVEL: Levetiracetam Lvl: 40.8 ug/mL — ABNORMAL HIGH (ref 10.0–40.0)

## 2016-01-22 MED ORDER — METHYLPREDNISOLONE SODIUM SUCC 125 MG IJ SOLR
125.0000 mg | Freq: Once | INTRAMUSCULAR | Status: AC
  Administered 2016-01-22: 125 mg via INTRAVENOUS
  Filled 2016-01-22: qty 2

## 2016-01-22 MED ORDER — FUROSEMIDE 10 MG/ML IJ SOLN
INTRAMUSCULAR | Status: AC
  Filled 2016-01-22: qty 4

## 2016-01-22 MED ORDER — FUROSEMIDE 10 MG/ML IJ SOLN
40.0000 mg | INTRAMUSCULAR | Status: AC
  Administered 2016-01-22: 40 mg via INTRAVENOUS

## 2016-01-22 MED ORDER — CLONIDINE HCL 0.2 MG/24HR TD PTWK
0.2000 mg | MEDICATED_PATCH | TRANSDERMAL | Status: DC
  Administered 2016-01-22: 0.2 mg via TRANSDERMAL
  Filled 2016-01-22: qty 1

## 2016-01-22 MED ORDER — NITROGLYCERIN 2 % TD OINT
1.0000 [in_us] | TOPICAL_OINTMENT | Freq: Once | TRANSDERMAL | Status: DC
  Filled 2016-01-22: qty 30

## 2016-01-22 MED ORDER — MORPHINE SULFATE (PF) 2 MG/ML IV SOLN
INTRAVENOUS | Status: AC
  Filled 2016-01-22: qty 1

## 2016-01-22 MED ORDER — MORPHINE SULFATE (PF) 2 MG/ML IV SOLN
1.0000 mg | Freq: Once | INTRAVENOUS | Status: AC
  Administered 2016-01-22: 1 mg via INTRAVENOUS

## 2016-01-22 MED ORDER — POTASSIUM CHLORIDE 10 MEQ/100ML IV SOLN
10.0000 meq | INTRAVENOUS | Status: AC
  Administered 2016-01-22 (×4): 10 meq via INTRAVENOUS
  Filled 2016-01-22 (×4): qty 100

## 2016-01-22 MED ORDER — FUROSEMIDE 10 MG/ML IJ SOLN
40.0000 mg | Freq: Two times a day (BID) | INTRAMUSCULAR | Status: DC
  Administered 2016-01-22 – 2016-01-26 (×8): 40 mg via INTRAVENOUS
  Filled 2016-01-22 (×9): qty 4

## 2016-01-22 NOTE — Consult Note (Signed)
PULMONARY / CRITICAL CARE MEDICINE   Name: [name] MRN: [id number] DOB: [birth year]    ADMISSION DATE:  01/19/2016 CONSULTATION DATE:  7/13  REFERRING MD:  Hollice Gong   CHIEF COMPLAINT:  Acute respiratory failure   HISTORY OF PRESENT ILLNESS:   Brief Narrative:  75 y.o. male with past medical history significant for COPD, chronic respiratory failure on 2 L nasal cannula oxygen, chronic diastolic CHF, NSCLC with metastases (s/p RU lobectomy/radiation/chemotherapy), brain metastases (s/p of craniotomy on 01/16/16), hypertension, stroke, seizure, BPH, depression, anxiety, GERD who presented to [facility] ED on 7/10 with shortness of breath and cough. He was admitted w/ a working dx of HCAP vs AECOPD and treated w/ empiric abx and supportive care. Had no sig improvement, then on 7/13 found on rounds hypertensive w/ BP 215/108. He was treated emergently w/ morphine, NTG, lasix and NIPPV. Transferred to the ICU where PCCM was asked to assume care.  PAST MEDICAL HISTORY :  He  has a past medical history of Arthritis; Wheezing; Sore throat; Carotid artery occlusion; COPD (chronic obstructive pulmonary disease) ([place]); Lumbar disc disease; Restless leg syndrome; Allergic rhinitis; Hypertension; Anxiety; Shortness of breath; Lung mass; GERD (gastroesophageal reflux disease); H/O hiatal hernia; Pre-operative cardiovascular examination; Nonspecific abnormal unspecified cardiovascular function study; Peripheral vascular disease, unspecified ([place]); Pneumonia (2014); On home oxygen therapy; Constipation; S/P radiation therapy (09/25/14); S/P radiation therapy (12/04/14 srs); S/P radiation therapy (07/18/15); Seizures ([place]) (12/13/15); History of chemotherapy; Headache; Occasional tremors; BPH (benign prostatic hyperplasia); Cancer ([place]) (09/14/12); and Metastasis to brain [facility]) (02/2015).  PAST SURGICAL HISTORY: He  has past surgical history that includes Carotid endarterectomy (10/15/2010); Hand surgery; Video bronchoscopy  (07/24/2012); Lobectomy (Right, 09/14/2012); Video assisted thoracoscopy (Right, 09/14/2012); Video bronchoscopy with endobronchial ultrasound (N/A, 08/27/2014); Scalene node biopsy (Right, 08/27/2014); sterotactic radiosurgery (Right, 09/25/14); power port a cath (Right); Colonoscopy; and Craniotomy (N/A, 01/06/2016).  No Known Allergies  Current Facility-Administered Medications on File Prior to Encounter  Medication  . sodium chloride 0.9 % injection 10 mL   Current Outpatient Prescriptions on File Prior to Encounter  Medication Sig  . amLODipine (NORVASC) 5 MG tablet Take 5 mg by mouth daily.  Marland Kitchen aspirin EC 81 MG tablet Take 81 mg by mouth daily. Reported on 12/22/2015  . budesonide (PULMICORT) 0.25 MG/2ML nebulizer solution Take 2 mLs (0.25 mg total) by nebulization 2 (two) times daily.  . clonazePAM (KLONOPIN) 0.5 MG tablet Take 0.5 mg by mouth 2 (two) times daily as needed for anxiety. Takes one tablet by mouth two times daily as needed for anxiety.  . cloNIDine (CATAPRES) 0.1 MG tablet Take 0.1 mg by mouth 2 (two) times daily.  Marland Kitchen dexamethasone (DECADRON) 4 MG tablet Take 1 tablet (4 mg total) by mouth 4 (four) times daily.  Marland Kitchen docusate sodium (COLACE) 100 MG capsule Take 1 capsule (100 mg total) by mouth 2 (two) times daily.  . famotidine (PEPCID) 40 MG tablet Take 1 tablet (40 mg total) by mouth 2 (two) times daily.  . feeding supplement, ENSURE ENLIVE, (ENSURE ENLIVE) LIQD Take 237 mLs by mouth 2 (two) times daily between meals.  . furosemide (LASIX) 40 MG tablet Take 1 tablet (40 mg total) by mouth 2 (two) times daily.  Marland Kitchen guaifenesin (HUMIBID E) 400 MG TABS tablet Take 400 mg by mouth 2 (two) times daily.  Marland Kitchen HYDROcodone-acetaminophen (NORCO) 7.5-325 MG tablet Take 1 tablet by mouth every 6 (six) hours as needed for moderate pain.  Marland Kitchen ipratropium-albuterol (DUONEB) 0.5-2.5 (3) MG/3ML SOLN Take 3 mLs  by nebulization 4 (four) times daily. (Patient taking differently: Take 3 mLs by nebulization 4  (four) times daily. Pt takes nebulizer 2 to 3 times daily.)  . isosorbide mononitrate (IMDUR) 30 MG 24 hr tablet Take 1 tablet (30 mg total) by mouth daily.  Marland Kitchen levETIRAcetam (KEPPRA) 750 MG tablet Take 2 tablets (1,500 mg total) by mouth 2 (two) times daily.  . metoprolol (LOPRESSOR) 50 MG tablet Take 1 tablet (50 mg total) by mouth 2 (two) times daily.  . OXYGEN Inhale 2-3 L into the lungs continuous.  . pramipexole (MIRAPEX) 1 MG tablet Take 1 mg by mouth at bedtime.  . senna (SENOKOT) 8.6 MG TABS tablet Take 2 tablets (17.2 mg total) by mouth daily.  . sodium chloride (OCEAN) 0.65 % SOLN nasal spray Place 1 spray into both nostrils as needed for congestion.  . sodium chloride 1 g tablet Take 2 tablets (2 g total) by mouth 3 (three) times daily with meals.  . tamsulosin (FLOMAX) 0.4 MG CAPS capsule Take 1 capsule (0.4 mg total) by mouth daily.  Marland Kitchen triamcinolone cream (KENALOG) 0.1 % Apply 1 application topically 2 (two) times daily as needed (itching/ dry skin).   . vitamin B-12 500 MCG tablet Take 1 tablet (500 mcg total) by mouth daily.  Marland Kitchen zolpidem (AMBIEN) 10 MG tablet Take 10 mg by mouth at bedtime as needed for sleep. Reported on 09/25/2015    FAMILY HISTORY:  His indicated that his mother is deceased. He indicated that his father is deceased. He indicated that his sister is alive. He indicated that only one of his two brothers is alive.   SOCIAL HISTORY: He  reports that he quit smoking about 17 months ago. His smoking use included Cigarettes. He has a 55 pack-year smoking history. He quit smokeless tobacco use about 63 years ago. He reports that he drinks alcohol. He reports that he does not use illicit drugs.  REVIEW OF SYSTEMS:   Unable to assess due to NIPPV>   SUBJECTIVE:  Short of breath but better w/ BIPAP and "morphine"  VITAL SIGNS: BP 175/102 mmHg  Pulse 122  Temp(Src) 97.7 F (36.5 C) (Oral)  Resp 18  Ht '5\' 9"'$  (1.753 m)  Wt 173 lb 9.6 oz (78.744 kg)  BMI 25.62  kg/m2  SpO2 100%  HEMODYNAMICS:    VENTILATOR SETTINGS:    INTAKE / OUTPUT: I/O last 3 completed shifts: In: 2757.5 [P.O.:720; I.V.:1187.5; IV Piggyback:850] Out: 1951 [OEUMP:5361; Stool:1]  PHYSICAL EXAMINATION: General:  75 year old male, anxious on NIPPV.  Neuro:  Hard of hearing. Limited in-sight. left sided hemi-paresis. Oriented X 4.  HEENT:  No JVD. BIPAP mask in place.  Cardiovascular:  Regular w/out MRG Lungs:  Exp wheeze, scattered rhonchi, Equal chest rise. + accessory use  Abdomen:  Soft, not tender + bowel sounds  Musculoskeletal:  Left side weak w/ decreased tone Skin:  Scattered ecchymosis. LE dependent edema   LABS:  BMET  Recent Labs Lab 01/16/16 0505 01/17/16 1211 01/20/16 0001  NA 133* 132* 133*  K 4.0 3.8 3.4*  CL 93* 93* 97*  CO2 34* 33* 29  BUN 28* 19 16  CREATININE 0.78 0.65 0.71  GLUCOSE 122* 85 179*    Electrolytes  Recent Labs Lab 01/16/16 0505 01/17/16 1211 01/20/16 0001  CALCIUM 7.8* 8.7* 7.9*  MG 2.4  --   --     CBC  Recent Labs Lab 01/16/16 0505 01/17/16 1211 01/20/16 0001  WBC 5.8 7.1 6.2  HGB 8.1* 10.7* 8.4*  HCT 24.5* 33.1* 26.0*  PLT 115* 117* 115*    Coag's  Recent Labs Lab 01/20/16 0342  APTT 26  INR 0.98    Sepsis Markers  Recent Labs Lab 01/20/16 0321 01/20/16 0342 01/20/16 0650  LATICACIDVEN 1.53 1.9 2.0*  PROCALCITON  --  <0.10  --     ABG  Recent Labs Lab 01/22/16 0840  PHART 7.409  PCO2ART 44.2  PO2ART 200*    Liver Enzymes  Recent Labs Lab 01/20/16 0001  AST 29  ALT 28  ALKPHOS 47  BILITOT 0.5  ALBUMIN 2.5*    Cardiac Enzymes No results for input(s): TROPONINI, PROBNP in the last 168 hours.  Glucose  Recent Labs Lab 01/15/16 1114 01/15/16 1649 01/15/16 2141 01/16/16 0617 01/16/16 1113 01/16/16 1623  GLUCAP 126* 153* 119* 120* 145* 123*    Imaging Dg Chest Port 1 View  01/22/2016  CLINICAL DATA:  Shortness of Breath EXAM: PORTABLE CHEST 1 VIEW  COMPARISON:  01/20/2016 FINDINGS: Cardiomediastinal silhouette is stable. Right IJ Port-A-Cath with tip in SVC. No pulmonary edema. Small bilateral pleural effusion. Hazy left basilar atelectasis or infiltrate. IMPRESSION: No pulmonary edema. Small bilateral pleural effusion. Hazy left basilar atelectasis or early infiltrate. Electronically Signed   By: Lahoma Crocker M.D.   On: 01/22/2016 08:36     STUDIES:  6/20 ECHO The estimated ejection fraction was in the range of 55% to 60%. Wall motion was normal; there were no regional wall motion abnormalities. The transmitral flow pattern was normal. The deceleration time of the early transmitral flow velocity was normal. The pulmonary vein flow pattern was normal. The tissue Doppler parameters were normal. Left ventricular diastolic function parameters were normal.  CULTURES: BCX2 7/12>>> UC 7/11: multiple specimens    ANTIBIOTICS: Cefepime 7/10>>> vanc 7/10>>>  SIGNIFICANT EVENTS:   LINES/TUBES:   DISCUSSION: This is a 75 year old w/ acute on chronic respiratory failure. Suspect primarily Hypertensive crisis w/ element of edema superimposed on top of underlying COPD, and stage V lung cancer. He was initially admitted w/ working dx of HCAP + AECOPD. He had been off his clonidine as he hypotensive on admit. Thus far has been treated w/ preload reduction via NTG, lasix and positive pressure. Seems to be responding to this. Will cont lasix, add clonidine patch, and cont NIPPV for now. Hope to transition this to PRN. He has almost no insight to his disease. Will cont to discuss limitations of care as he is a terrible candidate for life support should he worsen.  ASSESSMENT / PLAN:  PULMONARY A: Acute on Chronic Hypoxic Respiratory Failure  Flash edema AECOPD Possible HCAP vs aspiration  Stage IV lung cancer w/ Lymphangitic disease prior lobectomy 2014--> wide spread pulm, nodal, adrenal and brain mets P:   Cont NIPPV-->hope to make PRN later  this am  Cont scheduled BDs Cont lasix Wean FIO2 Asp precautions w/ consideration for swallow eval  See ID section  CARDIOVASCULAR A:  Acute on chronic diastolic HF Hypertensive crisis  P:  Cont preload reduction w/ lasix and Positive pressure PRN morphine (low dose) Add clonidine patch   RENAL A:   Hypokalemia  P:   Replace and recheck   GASTROINTESTINAL A:   Aspiration risk P:   NPO  HEMATOLOGIC A:   Anemia of chronic disease  Metastatic lung cancer w/ mets to brain and positive lymphangitic spread to lungs Thrombocytopenia  P:  PAS to LEs   INFECTIOUS A:   Possible  HCAP P:   Trend PCT See above   ENDOCRINE A:   Mild hyperglycemia  P:   Trend glucose   NEUROLOGIC A:   Brain mets w/ Left sided hemiparesis. Now s/p XRT and then Crani  H/o CVA Anxiety  P:   RASS goal: 0 Cont anticonvulsants  Cont decadron    FAMILY  - Updates:   - Inter-disciplinary family meet or Palliative Care meeting due by: 7/20  Erick Colace ACNP-BC [place] Pager # [phone] OR # [phone] if no answer 01/22/2016, 9:25 AM   Attending Note:  I have examined patient, reviewed labs, studies and notes. I have discussed the case with Jerrye Bushy, and I agree with the data and plans as amended above. Mr Hurrell is 21 with hx tobacco, COPD, HTN + dCHF and stage IV squamous cell lung CA. Treated with RUL lobectomy in 2013, more recently SBRT and craniotomy for a solitary brain metastasis. He was admitted with acute on chronic respiratory failure and treated for possible AE-COPD and HCAP. I have reviewed Ct chest done 01/20/16, does not show any evidence new consolidation, maybe some very subtle micronodular changes in the RML and lingula compared with 12/29/15, making HCAP unlikely. He developed acute dyspnea in setting severe HTN am 7/13, required biPAP placement and was treated for suspected flash pulmonary edema. On my evaluation he appears more comfortable  on BiPAP, is able to speak full sentences. He has some scattered rhonchi and expiratory wheezing. His insight into his prognosis from lung CA, multiple comorbidities is poor. We will continue BiPAp for now. Diuresis and afterload reduction as ordered. Continue current BD's and steroids. Broached subject of prognosis and the utility / futility of mechanical ventilation with him today. I do not believe he would do well if he were intubated. We will need to continue these discussions. he is not receptive to any limits on his care at this time.  Independent critical care time is 50 minutes.   Baltazar Apo, MD, PhD 01/22/2016, 12:11 PM [facility] [phone] or if no answer [phone]

## 2016-01-22 NOTE — Progress Notes (Signed)
[facility] Progress Note Patient Name: [name] DOB: Oct 31, 1940 MRN: [id number]   Date of Service  01/22/2016  HPI/Events of Note  Camera check done on the pt. Was transferred to ICU with flash pulm edema. They just removed Bipap for pt to have a break.  Pt looked comfortable, mild resp distress.  Denies CP. 170/80  91 22 100% on 2L  eICU Interventions  Cont on and off Bipap. More on. Definitely Bipap at HS Cont diuresis as scheduled. Will follow.         [place] 01/22/2016, 4:15 PM

## 2016-01-22 NOTE — Progress Notes (Signed)
Patient ID: [name], male   DOB: [birth year], 75 y.o.   MRN: [id number]  PROGRESS NOTE    [name]  OVF:[id number] DOB: 09-16-1940 DOA: 01/19/2016  PCP: Beatris Si  Brief Narrative:  75 y.o. male with past medical history significant for COPD, chronic respiratory failure on 2 L nasal cannula oxygen, chronic diastolic CHF, NSCLC with metastases (s/p RU lobectomy/radiation/chemotherapy), brain metastases (s/p of craniotomy on 01/16/16), hypertension, stroke, seizure, BPH, depression, anxiety, GERD who presented to [facility] ED with shortness of breath and cough.  In ED, blood pressure 86/63 but has improved with IV fluids to 150/70. Patient was afebrile. Blood work was notable for hemoglobin of 8.4, hemoglobin was 10.7 on his recent discharge 01/17/2016. Platelet count was 115, potassium 3.4, normal creatinine. CT chest showed clusters of nodularity in the right middle lobe and lingula which are new from prior study and most likely represent pneumonia. Metastatic disease is much less likely given rapid interval development of the nodules. CT head showed postoperative changes in the right posterior parietal region with a right posterior parietal intraparenchymal and subdural hematomas, no developing mass effect.  Assessment & Plan:   Principal Problem:  Acute Respiratory Distress - differential includes flash pulmonary edema (BP elevated significantly), COPD exacerbation, fluid overload, not hypoxic/hypotensive/chest pain so doubt PE. - STAT ABG - STAT CXR - STAT 1 in Nitropaste - Solumedrol 122m x1 now - small dose morphine for resp distress - IV lasix [id number] now - initiate Bipap - transfer to ICU for close monitoring    HCAP (healthcare-associated pneumonia) / acute respiratory failure with hypoxia - Based on CT chest, findings likely representing developing pneumonia - Considering patient's recent hospitalization he is treated with broad-spectrum antibiotics, cefepime and  vancomycin for possible healthcare associated pneumonia - Strep pneumonia is negative - Blood cultures, HIV are pending, Legionella is pending - Continue nebulizer treatments, changed to every 4 hours scheduled regimen and also placed order for every 2 hours as needed DuoNeb for shortness of breath or wheezing - May continue budesonide nebulizer twice daily as well  Active problems: Right parietal occipital metastatic lung cancer  - S/p Stereotactic right parietal occipital craniectomy for resection of occipital met  - Continue Decadron 4 mg by mouth 4 times a day - Continue Keppra for seizures - Patient was seen by palliative care in past so order placed for palliative care consultation for this hospital stay    DVT prophylaxis: SCDs bilaterally  Code Status: full code  Family Communication: No family at the bedside Disposition Plan: Home once medically stable, because of his immunosuppressed state and history of cancer I anticipate a longer hospital stay   Consultants:   Palliative care  Critical Care  Procedures:   None  Antimicrobials:   Cefepime and vancomycin 01/20/2016 -->   Subjective: This AM acutely SOB. he has sudden onset of worsening respiratory distress. He is awake and alert, denies chest pain but says that he feels like he cannot breathe. I came to bedside urgently, patient tachypneic, tacycardic but responsive, following commands.    Objective: Filed Vitals:   01/22/16 0512 01/22/16 0803 01/22/16 0815 01/22/16 0829  BP: 91/50  199/122 215/108  Pulse: 85  120 127  Temp: 97.7 F (36.5 C)     TempSrc: Oral     Resp: 18     Height:      Weight: 78.744 kg (173 lb 9.6 oz)     SpO2: 99% 100%  100%  Intake/Output Summary (Last 24 hours) at 01/22/16 0839 Last data filed at 01/22/16 0655  Gross per 24 hour  Intake 1453.75 ml  Output   1451 ml  Net   2.75 ml   Filed Weights   01/20/16 0843 01/21/16 0412 01/22/16 0512  Weight: 75.116 kg (165 lb 9.6  oz) 76.613 kg (168 lb 14.4 oz) 78.744 kg (173 lb 9.6 oz)    Examination:  General exam: In resp distress Respiratory system: Rhonchorous throughout, extremely tight, tachypneic, wheezing Cardiovascular system: S1 & S2 heard, Regular rhythm Gastrointestinal system: Abdomen is nondistended, soft and nontender. No organomegaly or masses felt. Normal bowel sounds heard. Central nervous system: Alert and oriented, but reduced level of consciousness. No focal neurological deficits. Extremities: Lower extremity pitting edema +1 appreciated, pulses palpable Skin: Scattered ecchymosis Psychiatry: Judgement and insight appear normal. Mood & affect appropriate.   Data Reviewed: I have personally reviewed following labs and imaging studies  CBC:  Recent Labs Lab 01/16/16 0505 01/17/16 1211 01/20/16 0001  WBC 5.8 7.1 6.2  NEUTROABS  --  5.0 4.9  HGB 8.1* 10.7* 8.4*  HCT 24.5* 33.1* 26.0*  MCV 93.5 93.8 94.2  PLT 115* 117* 725*   Basic Metabolic Panel:  Recent Labs Lab 01/16/16 0505 01/17/16 1211 01/20/16 0001  NA 133* 132* 133*  K 4.0 3.8 3.4*  CL 93* 93* 97*  CO2 34* 33* 29  GLUCOSE 122* 85 179*  BUN 28* 19 16  CREATININE 0.78 0.65 0.71  CALCIUM 7.8* 8.7* 7.9*  MG 2.4  --   --    GFR: Estimated Creatinine Clearance: 83.5 mL/min (by C-G formula based on Cr of 0.71). Liver Function Tests:  Recent Labs Lab 01/20/16 0001  AST 29  ALT 28  ALKPHOS 47  BILITOT 0.5  PROT 4.4*  ALBUMIN 2.5*   No results for input(s): LIPASE, AMYLASE in the last 168 hours. No results for input(s): AMMONIA in the last 168 hours. Coagulation Profile:  Recent Labs Lab 01/20/16 0342  INR 0.98   Cardiac Enzymes: No results for input(s): CKTOTAL, CKMB, CKMBINDEX, TROPONINI in the last 168 hours. BNP (last 3 results) No results for input(s): PROBNP in the last 8760 hours. HbA1C: No results for input(s): HGBA1C in the last 72 hours. CBG:  Recent Labs Lab 01/15/16 1649 01/15/16 2141  01/16/16 0617 01/16/16 1113 01/16/16 1623  GLUCAP 153* 119* 120* 145* 123*   Lipid Profile: No results for input(s): CHOL, HDL, LDLCALC, TRIG, CHOLHDL, LDLDIRECT in the last 72 hours. Thyroid Function Tests: No results for input(s): TSH, T4TOTAL, FREET4, T3FREE, THYROIDAB in the last 72 hours. Anemia Panel: No results for input(s): VITAMINB12, FOLATE, FERRITIN, TIBC, IRON, RETICCTPCT in the last 72 hours. Urine analysis:    Component Value Date/Time   COLORURINE YELLOW 01/20/2016 0027   APPEARANCEUR CLOUDY* 01/20/2016 0027   LABSPEC 1.017 01/20/2016 0027   PHURINE 7.0 01/20/2016 0027   GLUCOSEU NEGATIVE 01/20/2016 0027   HGBUR NEGATIVE 01/20/2016 0027   BILIRUBINUR NEGATIVE 01/20/2016 0027   KETONESUR NEGATIVE 01/20/2016 0027   PROTEINUR NEGATIVE 01/20/2016 0027   UROBILINOGEN 1.0 11/11/2014 0900   NITRITE NEGATIVE 01/20/2016 0027   LEUKOCYTESUR NEGATIVE 01/20/2016 0027   Sepsis Labs: '@LABRCNTIP' (procalcitonin:4,lacticidven:4)   Recent Results (from the past 240 hour(s))  Culture, Urine     Status: None   Collection Time: 01/14/16  3:37 PM  Result Value Ref Range Status   Specimen Description URINE, CATHETERIZED  Final   Special Requests NONE  Final   Culture NO  GROWTH  Final   Report Status 01/15/2016 FINAL  Final  Blood Culture (routine x 2)     Status: None (Preliminary result)   Collection Time: 01/20/16 12:01 AM  Result Value Ref Range Status   Specimen Description BLOOD RIGHT HAND  Final   Special Requests BOTTLES DRAWN AEROBIC AND ANAEROBIC 5ML  Final   Culture NO GROWTH 1 DAY  Final   Report Status PENDING  Incomplete  Blood Culture (routine x 2)     Status: None (Preliminary result)   Collection Time: 01/20/16 12:20 AM  Result Value Ref Range Status   Specimen Description BLOOD RIGHT HAND  Final   Special Requests BOTTLES DRAWN AEROBIC AND ANAEROBIC 5ML  Final   Culture NO GROWTH 1 DAY  Final   Report Status PENDING  Incomplete  Urine culture     Status:  Abnormal   Collection Time: 01/20/16 12:27 AM  Result Value Ref Range Status   Specimen Description URINE, RANDOM  Final   Special Requests NONE  Final   Culture MULTIPLE SPECIES PRESENT, SUGGEST RECOLLECTION (A)  Final   Report Status 01/21/2016 FINAL  Final  MRSA PCR Screening     Status: None   Collection Time: 01/20/16  8:04 PM  Result Value Ref Range Status   MRSA by PCR NEGATIVE NEGATIVE Final    Comment:        The GeneXpert MRSA Assay (FDA approved for NASAL specimens only), is one component of a comprehensive MRSA colonization surveillance program. It is not intended to diagnose MRSA infection nor to guide or monitor treatment for MRSA infections.       Radiology Studies: Dg Chest 2 View 01/17/2016 No active cardiopulmonary disease. Electronically Signed   By: Marybelle Killings M.D.   On: 01/17/2016 13:16   Ct Head Wo Contrast 01/20/2016  Postoperative changes in the right posterior parietal region with right posterior parietal intraparenchymal and subdural hematomas demonstrating typical evolution ule changes since previous study. No evidence of progression. No developing mass effect. Electronically Signed   By: Lucienne Capers M.D.   On: 01/20/2016 01:12   Ct Chest Wo Contrast 01/20/2016 Clusters of nodularity in the right middle lobe and lingula, new from prior study and most likely representing pneumonia. Metastatic disease is much less likely given rapid interval development of this nodules. Clinical correlation and follow-up recommended. Stable postsurgical changes of right upper lobectomy and stable small bilateral pleural effusions. Electronically Signed   By: Anner Crete M.D.   On: 01/20/2016 01:30      Scheduled Meds: . antiseptic oral rinse  7 mL Mouth Rinse BID  . budesonide  0.25 mg Nebulization BID  . ceFEPime (MAXIPIME) IV  1 g Intravenous Q8H  . dexamethasone  4 mg Oral QID  . dextromethorphan-guaiFENesin  1 tablet Oral BID  . docusate sodium  100 mg  Oral BID  . famotidine  40 mg Oral BID  . feeding supplement (ENSURE ENLIVE)  237 mL Oral BID BM  . furosemide      . ipratropium-albuterol  3 mL Nebulization Q4H  . isosorbide mononitrate  30 mg Oral Daily  . levETIRAcetam  1,500 mg Oral BID  . morphine      . nitroGLYCERIN  1 inch Topical Once  . potassium chloride  10 mEq Oral Once  . pramipexole  1 mg Oral QHS  . senna  2 tablet Oral Daily  . sodium chloride  2 g Oral TID WC  . tamsulosin  0.4  mg Oral Daily  . vancomycin  1,000 mg Intravenous Q12H  . vitamin B-12  500 mcg Oral Daily   Continuous Infusions:     LOS: 2 days   Greater than 50% of the time spent on counseling and coordinating the care.  46 min spent this AM.  Ardell Makarewicz Marry Guan, MD [facility] Pager [phone]  If 7PM-7AM, please contact night-coverage [email or web address] Password [facility] 01/22/2016, 8:39 AM

## 2016-01-22 NOTE — Clinical Social Work Note (Signed)
Patient transferred from [place] to [facility] this morning. Handoff information given to [facility] CSW.  This CSW signing off.  [name], [place]

## 2016-01-22 NOTE — Progress Notes (Signed)
Patient's wife made aware of patient's change in status and moving to [facility].  [place] [facility]

## 2016-01-22 NOTE — Progress Notes (Signed)
PT Cancellation Note  Patient Details Name: [name] MRN: [id number] DOB: Mar 07, 1941   Cancelled Treatment:    Reason Eval/Treat Not Completed: Medical issues which prohibited therapy. Pt c/o difficulty breathing this morning and was transferred to ICU for close monitoring.  Will hold PT until pt more medically appropriate for exertional activity.   Collie Siad PT, DPT  Pager: [phone] Phone: [phone] 01/22/2016, 10:58 AM

## 2016-01-22 NOTE — Significant Event (Signed)
Rapid Response Event Note  Overview: Called to see for acute resp distress Time Called: 0802 Arrival Time: 0808 Event Type: Respiratory  Initial Focused Assessment: On arrival patient in acute resp distress - RR 42 - using accessory muscles - bil BS present -  coarse BS with exp wheezing greater on right side - - pale - cool and dry - alert - oriented - can speak broken sentences - denies CP - only "can't breathe."  Strong cough- thick yellow secretions - tremors noted - has recent craniotomy for resection of mets CA - staples remain intact - left side weakness - follows commands - patient states he gets tremors when he does not have his anti-anxiety medicine.  ST 120.  O2 sats 96% on 6 liters nasal cannula - wears 2 liters nasal cannula at home - air [place] mouth breathing.  BP 197/122 .  Just finished DuoNeb treatment per RT.  IV fluids at 75 cc/hr   Interventions:  IV decreased to KVO.  Stat call for PCXR, ABG and Bipap.  Klopopin prn dose given.  Dr. Hollice Gong at bedside.  Morphine 1 mg IV given.  1" NTG paste applied.  Lasix 40 mg IV given.  Bipap initiated 12/6 40%.  BP decreasing - 175/102 HR 128 RR 32 on Bipap - O2 sats 98%. 0845:  More comfortable on Bipap - diuresing 500 cc - 160/93 HR 117 RR 28.  Patient states he feels better decrease use of accessory muscles.  Transferred to 2M03 with Bipap - handoff the [facility].    Plan of Care (if not transferred):  Event Summary: Name of Physician Notified: Dr. Hollice Gong at  (pta RRT)  Name of Consulting Physician Notified: PCCM at  (by Dr. Hollice Gong)  Outcome: Transferred (Comment) (11m4)  Event End Time: 0900  BQuin Hoop

## 2016-01-22 NOTE — Care Management Note (Signed)
Case Management Note  Patient Details  Name: [name] MRN: [id number] Date of Birth: [birth year]  Subjective/Objective:                    Action/Plan: Pt with ileus, and increased lt sided weakness. CM following for discharge needs.   Expected Discharge Date:                  Expected Discharge Plan:     In-House Referral:     Discharge planning Services     Post Acute Care Choice:    Choice offered to:     DME Arranged:    DME Agency:     HH Arranged:    HH Agency:     Status of Service:     If discussed at [employer], dates discussed:    Additional Comments: 01/22/2016 Pt transferred to ICU on BIPAP.    Pt is from [place] consulted Maryclare Labrador, RN 01/22/2016, 9:16 PM

## 2016-01-22 NOTE — Progress Notes (Signed)
Patient complaining of shortness of breath and unable to breathe.  Oxygen 97% on 2L.  Patient lung sounds wheezing/crackles.  Respiratory called and breathing treatment started.  Patient still having shortness of breath and rapid response and MD called.

## 2016-01-23 ENCOUNTER — Inpatient Hospital Stay (HOSPITAL_COMMUNITY): Payer: Medicare Other

## 2016-01-23 DIAGNOSIS — Z515 Encounter for palliative care: Secondary | ICD-10-CM

## 2016-01-23 LAB — BASIC METABOLIC PANEL
ANION GAP: 5 (ref 5–15)
BUN: 14 mg/dL (ref 6–20)
CALCIUM: 7.8 mg/dL — AB (ref 8.9–10.3)
CO2: 30 mmol/L (ref 22–32)
Chloride: 96 mmol/L — ABNORMAL LOW (ref 101–111)
Creatinine, Ser: 0.58 mg/dL — ABNORMAL LOW (ref 0.61–1.24)
Glucose, Bld: 106 mg/dL — ABNORMAL HIGH (ref 65–99)
Potassium: 3.9 mmol/L (ref 3.5–5.1)
SODIUM: 131 mmol/L — AB (ref 135–145)

## 2016-01-23 LAB — CBC
HCT: 27.9 % — ABNORMAL LOW (ref 39.0–52.0)
HEMOGLOBIN: 9.3 g/dL — AB (ref 13.0–17.0)
MCH: 31 pg (ref 26.0–34.0)
MCHC: 33.3 g/dL (ref 30.0–36.0)
MCV: 93 fL (ref 78.0–100.0)
Platelets: 105 10*3/uL — ABNORMAL LOW (ref 150–400)
RBC: 3 MIL/uL — AB (ref 4.22–5.81)
RDW: 15.5 % (ref 11.5–15.5)
WBC: 5 10*3/uL (ref 4.0–10.5)

## 2016-01-23 MED ORDER — LABETALOL HCL 5 MG/ML IV SOLN
10.0000 mg | INTRAVENOUS | Status: DC | PRN
  Administered 2016-01-23 – 2016-01-26 (×2): 10 mg via INTRAVENOUS
  Filled 2016-01-23 (×3): qty 4

## 2016-01-23 MED ORDER — WHITE PETROLATUM GEL
Status: AC
  Administered 2016-01-23: 0.2
  Filled 2016-01-23: qty 1

## 2016-01-23 MED ORDER — ASPIRIN EC 81 MG PO TBEC
81.0000 mg | DELAYED_RELEASE_TABLET | Freq: Every day | ORAL | Status: DC
  Administered 2016-01-23 – 2016-01-28 (×6): 81 mg via ORAL
  Filled 2016-01-23 (×6): qty 1

## 2016-01-23 MED ORDER — IPRATROPIUM-ALBUTEROL 0.5-2.5 (3) MG/3ML IN SOLN
3.0000 mL | Freq: Four times a day (QID) | RESPIRATORY_TRACT | Status: DC
  Administered 2016-01-23 – 2016-01-26 (×12): 3 mL via RESPIRATORY_TRACT
  Filled 2016-01-23 (×12): qty 3

## 2016-01-23 MED ORDER — AMLODIPINE BESYLATE 5 MG PO TABS
5.0000 mg | ORAL_TABLET | Freq: Every day | ORAL | Status: DC
  Administered 2016-01-23 – 2016-01-28 (×6): 5 mg via ORAL
  Filled 2016-01-23 (×6): qty 1

## 2016-01-23 MED ORDER — ENOXAPARIN SODIUM 40 MG/0.4ML ~~LOC~~ SOLN
40.0000 mg | SUBCUTANEOUS | Status: DC
  Administered 2016-01-23 – 2016-01-28 (×6): 40 mg via SUBCUTANEOUS
  Filled 2016-01-23 (×6): qty 0.4

## 2016-01-23 NOTE — Consult Note (Signed)
PULMONARY / CRITICAL CARE MEDICINE   Name: [name] MRN: [id number] DOB: 06-03-1941    ADMISSION DATE:  01/19/2016 CONSULTATION DATE:  01/22/2016  REFERRING MD:  Hollice Gong   CHIEF COMPLAINT:  Acute respiratory failure   Brief Narrative:  75 y.o. male with past medical history significant for COPD, chronic respiratory failure on 2 L nasal cannula oxygen, chronic diastolic CHF, NSCLC with metastases (s/p RU lobectomy/radiation/chemotherapy), brain metastases (s/p of craniotomy on 01/16/16), hypertension, stroke, seizure, BPH, depression, anxiety, GERD who presented to [facility] ED on 7/10 with shortness of breath and cough. He was admitted w/ a working dx of HCAP vs AECOPD and treated w/ empiric abx and supportive care. Had no sig improvement, then on 7/13 found on rounds hypertensive w/ BP 215/108. He was treated emergently w/ morphine, NTG, lasix and NIPPV. Transferred to the ICU.  SUBJECTIVE:  Breathing better.  Denies chest pain.  Still has cough.  VITAL SIGNS: BP 162/91 mmHg  Pulse 101  Temp(Src) 97.4 F (36.3 C) (Axillary)  Resp 28  Ht '5\' 9"'$  (1.753 m)  Wt 173 lb 9.6 oz (78.744 kg)  BMI 25.62 kg/m2  SpO2 100%  INTAKE / OUTPUT: I/O last 3 completed shifts: In: 2233.8 [I.V.:683.8; IV Piggyback:1550] Out: 7021 [Urine:7020; Stool:1]  PHYSICAL EXAMINATION: General: alert Neuro: normal strength HEENT: no stridor Cardiac: regular Chest: scattered rhonchi, no wheeze Abd: soft, non tender Ext: 1+ edema  LABS:  BMET  Recent Labs Lab 01/17/16 1211 01/20/16 0001 01/23/16 0455  NA 132* 133* 131*  K 3.8 3.4* 3.9  CL 93* 97* 96*  CO2 33* 29 30  BUN '19 16 14  '$ CREATININE 0.65 0.71 0.58*  GLUCOSE 85 179* 106*    Electrolytes  Recent Labs Lab 01/17/16 1211 01/20/16 0001 01/23/16 0455  CALCIUM 8.7* 7.9* 7.8*    CBC  Recent Labs Lab 01/17/16 1211 01/20/16 0001 01/23/16 0455  WBC 7.1 6.2 5.0  HGB 10.7* 8.4* 9.3*  HCT 33.1* 26.0* 27.9*  PLT 117* 115* 105*     Coag's  Recent Labs Lab 01/20/16 0342  APTT 26  INR 0.98    Sepsis Markers  Recent Labs Lab 01/20/16 0321 01/20/16 0342 01/20/16 0650  LATICACIDVEN 1.53 1.9 2.0*  PROCALCITON  --  <0.10  --     ABG  Recent Labs Lab 01/22/16 0840  PHART 7.409  PCO2ART 44.2  PO2ART 200*    Liver Enzymes  Recent Labs Lab 01/20/16 0001  AST 29  ALT 28  ALKPHOS 47  BILITOT 0.5  ALBUMIN 2.5*    Cardiac Enzymes No results for input(s): TROPONINI, PROBNP in the last 168 hours.  Glucose  Recent Labs Lab 01/16/16 1113 01/16/16 1623 01/22/16 0901  GLUCAP 145* 123* 122*    Imaging Dg Chest Port 1 View  01/23/2016  CLINICAL DATA:  Acute respiratory failure EXAM: PORTABLE CHEST 1 VIEW COMPARISON:  Portable chest x-ray of January 22, 2016 FINDINGS: The lungs are well-expanded. There is persistent increased density at the right lung base. No alveolar infiltrates are observed stable right apical density. The heart and pulmonary vascularity are normal. There is aortic arch calcification. The power port appliance tip projects over the midportion of the SVC. Old posterior right seventh rib fracture IMPRESSION: COPD. Small right pleural effusion. No alveolar pneumonia nor CHF. Chronic right apical density. Electronically Signed   By: David  Martinique M.D.   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 - BiPAP prn - oxygen to keep SpO2 90 to 95% - Scheduled BDs - continue lasix - f/u CXR intermittently  HTN crisis >> improved. - continue catapres, lasix, imdur, NTG  Anemia of critical illness and chronic disease. - f/u CBC  ?HCAP >> no definite infiltrate on  CXR and procalcitonin negative. - d/c Abx  Stage IV lung cancer with brain mets s/p craniotomy. - continue decadron, keppra  Goals of Care. - f/u with palliative care  Transfer to SDU 7/14 >> Back to [facility] 7/15 and PCCM off.  Chesley Mires, MD [facility] Pulmonary/Critical Care 01/23/2016, 11:00 AM Pager:  [phone] After 3pm call: [phone]

## 2016-01-23 NOTE — Progress Notes (Signed)
Daily Progress Note   Patient Name: [name]       Date: 01/23/2016 DOB: 22-Oct-1940  Age: 75 y.o. MRN#: [id number] Attending Physician: Collene Gobble, MD Primary Care Physician: Beatris Si Admit Date: 01/19/2016  Reason for Consultation/Follow-up: Establishing goals of care  Subjective: I met with [name] again today. We continue our conversations based on his poor prognosis. He is still having some respiratory distress and congestion and requiring high levels of oxygen. He is still very resistant to discussion of dying and expectations if he were to require life support. He is contradictory and keeps telling me that he would not want to live if he is unaware of what is going on. We discussed this in the context of being intubated and sedated. However he continues to desire life support and resuscitation even when explained that even this will not be enough to keep him alive - which he tells me is all that is important is to keep him alive. Offered emotional support. Updated wife via telephone (she is sick and unable to come to the hospital currently).   Length of Stay: 3  Current Medications: Scheduled Meds:  . amLODipine  5 mg Oral Daily  . antiseptic oral rinse  7 mL Mouth Rinse BID  . aspirin EC  81 mg Oral Daily  . budesonide  0.25 mg Nebulization BID  . cloNIDine  0.2 mg Transdermal Weekly  . dexamethasone  4 mg Oral QID  . dextromethorphan-guaiFENesin  1 tablet Oral BID  . docusate sodium  100 mg Oral BID  . enoxaparin (LOVENOX) injection  40 mg Subcutaneous Q24H  . famotidine  40 mg Oral BID  . feeding supplement (ENSURE ENLIVE)  237 mL Oral BID BM  . furosemide  40 mg Intravenous Q12H  . ipratropium-albuterol  3 mL Nebulization Q6H  . isosorbide mononitrate  30 mg  Oral Daily  . levETIRAcetam  1,500 mg Oral BID  . nitroGLYCERIN  1 inch Topical Once  . pramipexole  1 mg Oral QHS  . senna  2 tablet Oral Daily  . sodium chloride  2 g Oral TID WC  . tamsulosin  0.4 mg Oral Daily  . vitamin B-12  500 mcg Oral Daily    Continuous Infusions:    PRN Meds: clonazePAM, HYDROcodone-acetaminophen, ipratropium-albuterol, labetalol, sodium chloride,  triamcinolone cream, zolpidem  Physical Exam  Constitutional: He appears well-developed.  HENT:  Head: Normocephalic and atraumatic.  Cardiovascular: Tachycardia present.   Pulmonary/Chest: No accessory muscle usage. Tachypnea noted. He has rhonchi.  Moderately labored, congested, purse lipped breathing  Abdominal: Normal appearance.  Neurological: He is alert.  Appears to be mostly oriented but in denial and becomes fixated easily.             Vital Signs: BP 179/93 mmHg  Pulse 96  Temp(Src) 98.2 F (36.8 C) (Oral)  Resp 34  Ht _0  (1.753 m)  Wt 78.744 kg (173 lb 9.6 oz)  BMI 25.62 kg/m2  SpO2 100% SpO2: SpO2: 100 % O2 Device: O2 Device: High Flow Nasal Cannula O2 Flow Rate: O2 Flow Rate (L/min): 12 L/min  Intake/output summary:   Intake/Output Summary (Last 24 hours) at 01/23/16 1422 Last data filed at 01/23/16 1200  Gross per 24 hour  Intake   1570 ml  Output   5195 ml  Net  -3625 ml   LBM: Last BM Date: 01/21/16 Baseline Weight: Weight: 75.116 kg (165 lb 9.6 oz) Most recent weight: Weight: 78.744 kg (173 lb 9.6 oz)       Palliative Assessment/Data:    Flowsheet Rows        Most Recent Value   Intake Tab    Referral Department  Hospitalist   Unit at Time of Referral  Cardiac/Telemetry Unit   Palliative Care Primary Diagnosis  Cancer   Date Notified  01/20/16   Palliative Care Type  Return patient Palliative Care   Reason for referral  Clarify Goals of Care   Date of Admission  01/19/16   # of days IP prior to Palliative referral  1   Clinical Assessment    Psychosocial &  Spiritual Assessment    Palliative Care Outcomes       Patient Active Problem List   Diagnosis Date Noted  . HCAP (healthcare-associated pneumonia) 01/20/2016  . BPH (benign prostatic hyperplasia) 01/20/2016  . Anemia 01/20/2016  . Thrombocytopenia ([place]) 01/15/2016  . Hyponatremia 01/15/2016  . Chronic respiratory failure with hypoxia ([place]) 01/14/2016  . DNR (do not resuscitate) discussion   . Malnutrition of moderate degree 01/12/2016  . Palliative care encounter   . Acute confusion   . S/P craniotomy 01/06/2016  . Acute on chronic respiratory failure ([place]) 01/06/2016  . COPD exacerbation ([place]) 12/30/2015  . Acute exacerbation of COPD with asthma ([place]) 12/30/2015  . Acute exacerbation of CHF (congestive heart failure) ([place]) 12/30/2015  . Leukocytosis 12/30/2015  . COPD mixed type ([place]) 12/25/2015  . Edema 12/25/2015  . New onset seizure ([place])   . Malignant neoplasm of lung ([place])   . Benign essential HTN   . Acute left hemiparesis ([place])   . Left hemiparesis ([place]) 12/13/2015  . Respiratory failure ([place]) 12/13/2015  . Brain metastases ([place])   . Volume overload 09/26/2015  . Hand foot syndrome 09/26/2015  . Malignant neoplasm of hilus of lung ([place]) 09/25/2015  . Unintentional weight loss 09/10/2015  . Hypertension 09/10/2015  . Encounter for antineoplastic chemotherapy 07/20/2015  . 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 Metastatic lung cancer (metastasis from lung to other site) [facility])   . Poor venous access   . Epistaxis 05/30/2015  . Extravasation, infusion or chemotherapeutic agent 05/09/2015  . Hypokalemia 04/02/2015  . Constipation 01/29/2015  . Insomnia 01/29/2015  . Cancer associated pain 12/30/2014  . Rash 12/30/2014  . Syncope  12/30/2014  . Encounter for antineoplastic immunotherapy 12/16/2014  . UTI (lower urinary tract infection) 11/11/2014  . Sepsis ([place]) 11/11/2014  . Left arm cellulitis   . Blood poisoning ([place])   . Dehydration  10/12/2014  . Renal stone 10/05/2014  . Pyelonephritis 10/05/2014  . Chemotherapy induced neutropenia ([place]) 09/18/2014  . Dyspnea 09/11/2014  . Brain metastasis ([place]) 09/11/2014  . Tachycardia 06/21/2014  . Carotid stenosis 05/01/2014  . Aftercare following surgery of the circulatory system 05/01/2014  . Weakness-Hand and  Legs 05/01/2014  . Coronary atherosclerosis of native coronary artery 03/07/2014  . Essential hypertension, benign 03/07/2014  . Tobacco use disorder 03/07/2014  . Neoplasm of upper lobe of right lung ([place]) 10/26/2012  . Peripheral vascular disease, unspecified ([place])   . COPD (chronic obstructive pulmonary disease) ([place]) 11/16/2011  . Occlusion and stenosis of carotid artery without mention of cerebral infarction 05/12/2011    Palliative Care Assessment & Plan   Patient Profile: 75 y.o. male with past medical history of Arthritis, carotid artery occlusion, severe COPD, restless leg syndrome, lumbar disc disease, hypertension, GERD, peripheral vascular disease, cancer (initially diagnosed in 2014), now with return of disease to brain admitted on 01/06/2016 with left-sided weakness and tonic-clonic seizures that required intubation. He underwent craniotomy on 6/27. He returned this admission after discharge only a few days with worsening respiratory status.   Assessment: Lying in bed. Off BiPAP. Purse lipped breathing.   Recommendations/Plan:  Per PCCM/primary team.   Goals of Care and Additional Recommendations:  Limitations on Scope of Treatment: Full Scope Treatment  Code Status:    Code Status Orders        Start     Ordered   01/20/16 0313  Full code   Continuous     01/20/16 0313    Code Status History    Date Active Date Inactive Code Status Order ID Comments User Context   01/06/2016  1:43 PM 01/16/2016  8:32 PM Full Code [id number]  Kary Kos, MD Inpatient   12/30/2015  5:44 AM 12/30/2015  9:03 PM Full Code [id number]  Norval Morton, MD Inpatient     12/30/2015  5:36 AM 12/30/2015  5:44 AM Full Code [id number]  Norval Morton, MD ED   12/13/2015  4:49 PM 12/16/2015  5:49 PM Full Code [id number]  Grace Bushy Minor, NP Inpatient   11/11/2014  1:45 PM 11/14/2014  2:26 PM Full Code [id number]  Verlee Monte, MD Inpatient   09/14/2012 12:34 PM 09/20/2012  2:03 PM Full Code [id number]  John Giovanni, PA-C Inpatient       Prognosis:   < 6 months  Discharge Planning:  [place] for rehab with Palliative care service follow-up  Thank you for allowing the Palliative Medicine Team to assist in the care of this patient.   Time In: 1030 Time Out: 1100 Total Time 51mn Prolonged Time Billed  no       Greater than 50%  of this time was spent counseling and coordinating care related to the above assessment and plan.  PPershing Proud NP  Please contact Palliative Medicine Team phone at [phone] questions and concerns.

## 2016-01-23 NOTE — Care Management Important Message (Signed)
Important Message  Patient Details  Name: [name] MRN: [id number] Date of Birth: 1941-06-14   Medicare Important Message Given:  Yes    [name] [name] 01/23/2016, 11:30 AM

## 2016-01-23 NOTE — Progress Notes (Signed)
Patient transfer to ICU service 7/13, now intermittently on Bipap/HFNC. Appreciate PCCM assistance, [facility] has signed off.   Please call to transfer care when patient appropriate for floor.

## 2016-01-23 NOTE — Progress Notes (Signed)
PT Cancellation Note  Patient Details Name: WYMAN MESCHKE MRN: [id number] DOB: Sep 14, 1940   Cancelled Treatment:    Reason Eval/Treat Not Completed: Patient declined, no reason specified Pt declines participating in PT today. Reports feeling anxious and has noted increased RR to 37-40. Will follow up next available time.   Marguarite Arbour A Jlon Betker 01/23/2016, 12:09 PM Wray Kearns, [place], DPT [phone]

## 2016-01-24 LAB — CBC
HEMATOCRIT: 27.6 % — AB (ref 39.0–52.0)
HEMOGLOBIN: 8.9 g/dL — AB (ref 13.0–17.0)
MCH: 30.4 pg (ref 26.0–34.0)
MCHC: 32.2 g/dL (ref 30.0–36.0)
MCV: 94.2 fL (ref 78.0–100.0)
Platelets: 110 10*3/uL — ABNORMAL LOW (ref 150–400)
RBC: 2.93 MIL/uL — AB (ref 4.22–5.81)
RDW: 15.6 % — AB (ref 11.5–15.5)
WBC: 4.3 10*3/uL (ref 4.0–10.5)

## 2016-01-24 LAB — BASIC METABOLIC PANEL
ANION GAP: 6 (ref 5–15)
BUN: 16 mg/dL (ref 6–20)
CHLORIDE: 95 mmol/L — AB (ref 101–111)
CO2: 33 mmol/L — ABNORMAL HIGH (ref 22–32)
Calcium: 8.2 mg/dL — ABNORMAL LOW (ref 8.9–10.3)
Creatinine, Ser: 0.47 mg/dL — ABNORMAL LOW (ref 0.61–1.24)
GFR calc Af Amer: >60 mL/min (ref >60)
GFR calc non Af Amer: >60 mL/min (ref >60)
Glucose, Bld: 115 mg/dL — ABNORMAL HIGH (ref 65–99)
POTASSIUM: 4.1 mmol/L (ref 3.5–5.1)
SODIUM: 134 mmol/L — AB (ref 135–145)

## 2016-01-24 NOTE — Progress Notes (Signed)
PROGRESS NOTE    [name]  MIW:[id number] DOB: [birth year] DOA: 01/19/2016 PCP: Beatris Si   Brief Narrative:  75 y.o. WM PMHx  Depression, Anxiety, CVA, Seizure, COPD, Chronic Respiratory Failure on 2 L nasal cannula oxygen, Chronic Diastolic CHF, HTN, , NSCLC with Metastases (s/p RU Lobectomy/radiation/chemotherapy), Brain Metastases (s/p of craniotomy on 02/05/16+ XRT), , BPH, , GERD,   who presents with shortness of breath and cough.   Patient reports that he has worsening shortness of breath in the past 2 days. He coughs up yellow colored sputum, no fever, chills or chest pain. He has nausea and vomited once, but no abdominal pain or diarrhea. Patient does not have symptoms of UTI. No new unilateral weakness, vision change or hearing loss.   ED Course: pt was found to have WBC 6.2, temperature 90.9, bradycardia, especially tachypnea, potassium 3.4, creatinine normal, low blood pressure 86/63 which improved her to 119/65 with 1 L normal saline bolus. Pt is admitted to tele bed for further eval and treatment.  # CT-chest showed clusters of nodularity in the right middle lobe and lingula, new from prior study and most likely representing pneumonia. Metastatic disease is much less likely given rapid interval development of this nodules. stable postsurgical changes of right upper lobectomy and stable small bilateral pleural effusions.  # CT head showed postoperative changes in the right posterior parietal region with right posterior parietal intraparenchymal and subdural hematomas demonstrating typical evolution ule changes since previous study. No evidence of progression. No developing mass effect.    Assessment & Plan:   Principal Problem:   HCAP (healthcare-associated pneumonia) Active Problems:   Essential hypertension, benign   Brain metastasis ([place])   Sepsis ([place])   Hypokalemia   Metastatic lung cancer (metastasis from lung to other site) ([place])   COPD exacerbation  ([place])   Acute on chronic respiratory failure (HCC)   Thrombocytopenia (HCC)   Hyponatremia   BPH (benign prostatic hyperplasia)   Anemia   Acute on chronic hypoxic respiratory failure/COPD exacerbation  -Multifactorial to include pulmonary edema, acute diastolic CHF, COPD exacerbation, and stage IV lung cancer  - BiPAP prn - oxygen to keep SpO2 90 to 95% - Scheduled BDs - continue lasix - f/u CXR intermittently  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 -Notify oncology. Patient last seen by Dr. Lorna Few oncologist June 2017  HTN crisis -Resolved -Amlodipine 5 mg daily -Clonidine 0.2 mg transdermal - Lasix 40 mg BID -Imdur 30 mg daily  Anemia of critical illness and chronic disease. - f/u CBC  ?HCAP  - no definite infiltrate on CXR and procalcitonin negative. - d/c Abx  Stage IV lung cancer with brain mets s/p craniotomy. - continue decadron, keppra -In A.m. contact Dr. Curt Bears oncology   DVT prophylaxis: Lovenox Code Status: Full Family Communication: None Disposition Plan: ??   Consultants:  PCCM Neurosurgery   Procedures/Significant Events:  6/20 ECHO >> EF 55 to 60%  Cultures BCX2 7/12>>> UC 7/11: multiple specimens  RSV 7/13 >> negative  Antimicrobials: Cefepime 7/10>>> 7/14 vanc 7/10>>> 7/14   Devices    LINES / TUBES:      Continuous Infusions:    Subjective: 7/15  A/O 3 (does not know when), follows all commands knows the name of his specialists    Objective: Filed Vitals:   01/24/16 0734 01/24/16 0800 01/24/16 0834 01/24/16 0900  BP:  126/81  101/53  Pulse:  64  91  Temp: 97.6 F (36.4 C)  TempSrc: Oral     Resp:  16  18  Height:      Weight:      SpO2:  100% 100% 100%    Intake/Output Summary (Last 24 hours) at 01/24/16 5397 Last data filed at 01/24/16 0900  Gross per 24 hour  Intake    820 ml  Output   3600 ml  Net  -2780 ml   Filed Weights   01/20/16 0843 01/21/16 0412 01/22/16  0512  Weight: 75.116 kg (165 lb 9.6 oz) 76.613 kg (168 lb 14.4 oz) 78.744 kg (173 lb 9.6 oz)    Examination:  General: A/O 3 (does not know when), follows all commands, No acute respiratory distress Eyes: negative scleral hemorrhage, negative anisocoria, negative icterus ENT: Negative Runny nose, negative gingival bleeding, Neck:  Negative scars, masses, torticollis, lymphadenopathy, JVD Lungs: Clear to auscultation bilaterally, except for decreased apical breath sounds, without wheezes or crackles Cardiovascular: Tachycardic, Regular rhythm without murmur gallop or rub normal S1 and S2 Abdomen: negative abdominal pain, nondistended, positive soft, bowel sounds, no rebound, no ascites, no appreciable mass Extremities: No significant cyanosis, clubbing. Bilateral lower edema 1-2+ Lt>>Rt Skin: Negative rashes, lesions, ulcers, right occipital/crown incision closed with staples negative sign of infection Psychiatric:  Negative depression, negative anxiety, negative fatigue, negative mania  Central nervous system:  Cranial nerves II through XII intact, tongue/uvula midline, all extremities muscle strength 5/5, sensation intact throughout, negative dysarthria, negative expressive aphasia, negative receptive aphasia.  .     Data Reviewed: Care during the described time interval was provided by me .  I have reviewed this patient's available data, including medical history, events of note, physical examination, and all test results as part of my evaluation. I have personally reviewed and interpreted all radiology studies.  CBC:  Recent Labs Lab 01/17/16 1211 01/20/16 0001 01/23/16 0455 01/24/16 0520  WBC 7.1 6.2 5.0 4.3  NEUTROABS 5.0 4.9  --   --   HGB 10.7* 8.4* 9.3* 8.9*  HCT 33.1* 26.0* 27.9* 27.6*  MCV 93.8 94.2 93.0 94.2  PLT 117* 115* 105* 673*   Basic Metabolic Panel:  Recent Labs Lab 01/17/16 1211 01/20/16 0001 01/23/16 0455 01/24/16 0520  NA 132* 133* 131* 134*  K  3.8 3.4* 3.9 4.1  CL 93* 97* 96* 95*  CO2 33* 29 30 33*  GLUCOSE 85 179* 106* 115*  BUN '19 16 14 16  '$ CREATININE 0.65 0.71 0.58* 0.47*  CALCIUM 8.7* 7.9* 7.8* 8.2*   GFR: Estimated Creatinine Clearance: 83.5 mL/min (by C-G formula based on Cr of 0.47). Liver Function Tests:  Recent Labs Lab 01/20/16 0001  AST 29  ALT 28  ALKPHOS 47  BILITOT 0.5  PROT 4.4*  ALBUMIN 2.5*   No results for input(s): LIPASE, AMYLASE in the last 168 hours. No results for input(s): AMMONIA in the last 168 hours. Coagulation Profile:  Recent Labs Lab 01/20/16 0342  INR 0.98   Cardiac Enzymes: No results for input(s): CKTOTAL, CKMB, CKMBINDEX, TROPONINI in the last 168 hours. BNP (last 3 results) No results for input(s): PROBNP in the last 8760 hours. HbA1C: No results for input(s): HGBA1C in the last 72 hours. CBG:  Recent Labs Lab 01/22/16 0901  GLUCAP 122*   Lipid Profile: No results for input(s): CHOL, HDL, LDLCALC, TRIG, CHOLHDL, LDLDIRECT in the last 72 hours. Thyroid Function Tests: No results for input(s): TSH, T4TOTAL, FREET4, T3FREE, THYROIDAB in the last 72 hours. Anemia Panel: No results for input(s): VITAMINB12, FOLATE, FERRITIN, TIBC,  IRON, RETICCTPCT in the last 72 hours. Urine analysis:    Component Value Date/Time   COLORURINE YELLOW 01/20/2016 0027   APPEARANCEUR CLOUDY* 01/20/2016 0027   LABSPEC 1.017 01/20/2016 0027   PHURINE 7.0 01/20/2016 0027   GLUCOSEU NEGATIVE 01/20/2016 0027   HGBUR NEGATIVE 01/20/2016 0027   BILIRUBINUR NEGATIVE 01/20/2016 0027   KETONESUR NEGATIVE 01/20/2016 0027   PROTEINUR NEGATIVE 01/20/2016 0027   UROBILINOGEN 1.0 11/11/2014 0900   NITRITE NEGATIVE 01/20/2016 0027   LEUKOCYTESUR NEGATIVE 01/20/2016 0027   Sepsis Labs: '@LABRCNTIP'$ (procalcitonin:4,lacticidven:4)  ) Recent Results (from the past 240 hour(s))  Culture, Urine     Status: None   Collection Time: 01/14/16  3:37 PM  Result Value Ref Range Status   Specimen  Description URINE, CATHETERIZED  Final   Special Requests NONE  Final   Culture NO GROWTH  Final   Report Status 01/15/2016 FINAL  Final  Blood Culture (routine x 2)     Status: None (Preliminary result)   Collection Time: 01/20/16 12:01 AM  Result Value Ref Range Status   Specimen Description BLOOD RIGHT HAND  Final   Special Requests BOTTLES DRAWN AEROBIC AND ANAEROBIC 5ML  Final   Culture NO GROWTH 3 DAYS  Final   Report Status PENDING  Incomplete  Blood Culture (routine x 2)     Status: None (Preliminary result)   Collection Time: 01/20/16 12:20 AM  Result Value Ref Range Status   Specimen Description BLOOD RIGHT HAND  Final   Special Requests BOTTLES DRAWN AEROBIC AND ANAEROBIC 5ML  Final   Culture NO GROWTH 3 DAYS  Final   Report Status PENDING  Incomplete  Urine culture     Status: Abnormal   Collection Time: 01/20/16 12:27 AM  Result Value Ref Range Status   Specimen Description URINE, RANDOM  Final   Special Requests NONE  Final   Culture MULTIPLE SPECIES PRESENT, SUGGEST RECOLLECTION (A)  Final   Report Status 01/21/2016 FINAL  Final  MRSA PCR Screening     Status: None   Collection Time: 01/20/16  8:04 PM  Result Value Ref Range Status   MRSA by PCR NEGATIVE NEGATIVE Final    Comment:        The GeneXpert MRSA Assay (FDA approved for NASAL specimens only), is one component of a comprehensive MRSA colonization surveillance program. It is not intended to diagnose MRSA infection nor to guide or monitor treatment for MRSA infections.   Respiratory Panel by PCR     Status: None   Collection Time: 01/22/16 10:07 AM  Result Value Ref Range Status   Adenovirus NOT DETECTED NOT DETECTED Final   Coronavirus 229E NOT DETECTED NOT DETECTED Final   Coronavirus HKU1 NOT DETECTED NOT DETECTED Final   Coronavirus NL63 NOT DETECTED NOT DETECTED Final   Coronavirus OC43 NOT DETECTED NOT DETECTED Final   Metapneumovirus NOT DETECTED NOT DETECTED Final   Rhinovirus /  Enterovirus NOT DETECTED NOT DETECTED Final   Influenza A NOT DETECTED NOT DETECTED Final   Influenza A H1 NOT DETECTED NOT DETECTED Final   Influenza A H1 2009 NOT DETECTED NOT DETECTED Final   Influenza A H3 NOT DETECTED NOT DETECTED Final   Influenza B NOT DETECTED NOT DETECTED Final   Parainfluenza Virus 1 NOT DETECTED NOT DETECTED Final   Parainfluenza Virus 2 NOT DETECTED NOT DETECTED Final   Parainfluenza Virus 3 NOT DETECTED NOT DETECTED Final   Parainfluenza Virus 4 NOT DETECTED NOT DETECTED Final   Respiratory Syncytial  Virus NOT DETECTED NOT DETECTED Final   Bordetella pertussis NOT DETECTED NOT DETECTED Final   Chlamydophila pneumoniae NOT DETECTED NOT DETECTED Final   Mycoplasma pneumoniae NOT DETECTED NOT DETECTED Final         Radiology Studies: Dg Chest Port 1 View  01/23/2016  CLINICAL DATA:  Acute respiratory failure EXAM: PORTABLE CHEST 1 VIEW COMPARISON:  Portable chest x-ray of January 22, 2016 FINDINGS: The lungs are well-expanded. There is persistent increased density at the right lung base. No alveolar infiltrates are observed stable right apical density. The heart and pulmonary vascularity are normal. There is aortic arch calcification. The power port appliance tip projects over the midportion of the SVC. Old posterior right seventh rib fracture IMPRESSION: COPD. Small right pleural effusion. No alveolar pneumonia nor CHF. Chronic right apical density. Electronically Signed   By: David  Martinique M.D.   On: 01/23/2016 07:30        Scheduled Meds: . amLODipine  5 mg Oral Daily  . antiseptic oral rinse  7 mL Mouth Rinse BID  . aspirin EC  81 mg Oral Daily  . budesonide  0.25 mg Nebulization BID  . cloNIDine  0.2 mg Transdermal Weekly  . cyanocobalamin  500 mcg Oral Daily  . dexamethasone  4 mg Oral QID  . dextromethorphan-guaiFENesin  1 tablet Oral BID  . docusate sodium  100 mg Oral BID  . enoxaparin (LOVENOX) injection  40 mg Subcutaneous Q24H  . famotidine   40 mg Oral BID  . feeding supplement (ENSURE ENLIVE)  237 mL Oral BID BM  . furosemide  40 mg Intravenous Q12H  . ipratropium-albuterol  3 mL Nebulization Q6H  . isosorbide mononitrate  30 mg Oral Daily  . levETIRAcetam  1,500 mg Oral BID  . nitroGLYCERIN  1 inch Topical Once  . pramipexole  1 mg Oral QHS  . senna  2 tablet Oral Daily  . sodium chloride  2 g Oral TID WC  . tamsulosin  0.4 mg Oral Daily   Continuous Infusions:    LOS: 4 days    Time spent: 40 minutes    Cobey Raineri, Geraldo Docker, MD [facility] Pager [phone]   If 7PM-7AM, please contact night-coverage [email or web address] Password [facility] 01/24/2016, 9:21 AM

## 2016-01-25 ENCOUNTER — Other Ambulatory Visit: Payer: Self-pay

## 2016-01-25 DIAGNOSIS — E871 Hypo-osmolality and hyponatremia: Secondary | ICD-10-CM

## 2016-01-25 LAB — CBC
HCT: 28.1 % — ABNORMAL LOW (ref 39.0–52.0)
Hemoglobin: 9 g/dL — ABNORMAL LOW (ref 13.0–17.0)
MCH: 30.1 pg (ref 26.0–34.0)
MCHC: 32 g/dL (ref 30.0–36.0)
MCV: 94 fL (ref 78.0–100.0)
PLATELETS: 111 10*3/uL — AB (ref 150–400)
RBC: 2.99 MIL/uL — ABNORMAL LOW (ref 4.22–5.81)
RDW: 15.5 % (ref 11.5–15.5)
WBC: 4.3 10*3/uL (ref 4.0–10.5)

## 2016-01-25 LAB — BASIC METABOLIC PANEL
Anion gap: 6 (ref 5–15)
BUN: 18 mg/dL (ref 6–20)
CALCIUM: 7.9 mg/dL — AB (ref 8.9–10.3)
CO2: 36 mmol/L — ABNORMAL HIGH (ref 22–32)
CREATININE: 0.53 mg/dL — AB (ref 0.61–1.24)
Chloride: 91 mmol/L — ABNORMAL LOW (ref 101–111)
GFR calc Af Amer: >60 mL/min (ref >60)
Glucose, Bld: 118 mg/dL — ABNORMAL HIGH (ref 65–99)
POTASSIUM: 3.7 mmol/L (ref 3.5–5.1)
SODIUM: 133 mmol/L — AB (ref 135–145)

## 2016-01-25 LAB — CULTURE, BLOOD (ROUTINE X 2)
Culture: NO GROWTH
Culture: NO GROWTH

## 2016-01-25 LAB — MAGNESIUM: MAGNESIUM: 2 mg/dL (ref 1.7–2.4)

## 2016-01-25 LAB — TROPONIN I: Troponin I: 0.04 ng/mL (ref <0.03)

## 2016-01-25 MED ORDER — METOPROLOL TARTRATE 5 MG/5ML IV SOLN
5.0000 mg | Freq: Once | INTRAVENOUS | Status: AC
  Administered 2016-01-25: 5 mg via INTRAVENOUS
  Filled 2016-01-25: qty 5

## 2016-01-25 MED ORDER — CLONAZEPAM 0.5 MG PO TABS
0.5000 mg | ORAL_TABLET | Freq: Two times a day (BID) | ORAL | Status: DC
  Administered 2016-01-26: 0.5 mg via ORAL
  Filled 2016-01-25: qty 1

## 2016-01-25 NOTE — Progress Notes (Signed)
PROGRESS NOTE    [name]  CHE:[id number] DOB: 01-15-1941 DOA: 01/19/2016 PCP: Beatris Si   Brief Narrative:  75 y.o. WM PMHx  Depression, Anxiety, CVA, Seizure, COPD, Chronic Respiratory Failure on 2 L nasal cannula oxygen, Chronic Diastolic CHF, HTN, , NSCLC with Metastases (s/p RU Lobectomy/radiation/chemotherapy), Brain Metastases (s/p of craniotomy on 02/05/16+ XRT), , BPH, , GERD,   who presents with shortness of breath and cough.   Patient reports that he has worsening shortness of breath in the past 2 days. He coughs up yellow colored sputum, no fever, chills or chest pain. He has nausea and vomited once, but no abdominal pain or diarrhea. Patient does not have symptoms of UTI. No new unilateral weakness, vision change or hearing loss.   ED Course: pt was found to have WBC 6.2, temperature 90.9, bradycardia, especially tachypnea, potassium 3.4, creatinine normal, low blood pressure 86/63 which improved her to 119/65 with 1 L normal saline bolus. Pt is admitted to tele bed for further eval and treatment.  # CT-chest showed clusters of nodularity in the right middle lobe and lingula, new from prior study and most likely representing pneumonia. Metastatic disease is much less likely given rapid interval development of this nodules. stable postsurgical changes of right upper lobectomy and stable small bilateral pleural effusions.  # CT head showed postoperative changes in the right posterior parietal region with right posterior parietal intraparenchymal and subdural hematomas demonstrating typical evolution ule changes since previous study. No evidence of progression. No developing mass effect.    Assessment & Plan:   Principal Problem:   HCAP (healthcare-associated pneumonia) Active Problems:   Essential hypertension, benign   Brain metastasis ([place])   Sepsis ([place])   Hypokalemia   Metastatic lung cancer (metastasis from lung to other site) ([place])   COPD exacerbation  ([place])   Acute on chronic respiratory failure (HCC)   Thrombocytopenia (HCC)   Hyponatremia   BPH (benign prostatic hyperplasia)   Anemia   Acute on chronic hypoxic respiratory failure/COPD exacerbation  -Multifactorial to include pulmonary edema, acute diastolic CHF, COPD exacerbation, and stage IV lung cancer  - BiPAP prn - oxygen to keep SpO2 90 to 95% - Scheduled BDs - Resolved  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 -7/16 Notified Dr. Irene Limbo oncology who stated they 1 aware patient had been admitted. Will contact Dr. Lorna Few patient's oncologist who will see him in A.m. to determine additional treatment modalities.  -Patient last seen by Dr. Lorna Few oncologist June 2017, appears received XRT at that time.  HTN crisis -Resolved -Amlodipine 5 mg daily -Clonidine 0.2 mg transdermal - Lasix 40 mg BID -Imdur 30 mg daily  Anemia of critical illness and chronic disease. - f/u CBC  ?HCAP  - no definite infiltrate on CXR and procalcitonin negative. - d/c Abx  Stage IV lung cancer with brain mets s/p craniotomy. - Decadron 4 mg  QID -Keppra 1500 mg  BID -Phone consult with Dr. Irene Limbo Oncology who will contact Dr. Curt Bears (patient's oncologist) and have him see patient in A.m.  Goals of care -Await oncology evaluation for further treatment modalities. Then have patient evaluate for CIR vs SNF   DVT prophylaxis: Lovenox Code Status: Full Family Communication: Spoke with wife on phone and she verified that she has her own significant illnesses. Feels his best discharge option would be to CIR vs SNF Disposition Plan: Await recommendations from oncology    Consultants:  PCCM Neurosurgery Phone consult with Dr. Irene Limbo Oncology  Procedures/Significant Events:  6/20 ECHO >> moderate  focal basal hypertrophy of the septum and mild hypertrophy posterior wall.  -LVEF= 55% to 60%.   Cultures BCX2 7/12>>> UC 7/11: multiple specimens  RSV 7/13  >> negative  Antimicrobials: Cefepime 7/10>>> 7/14 vanc 7/10>>> 7/14   Devices    LINES / TUBES:      Continuous Infusions:    Subjective: 7/16  A/O 4, sitting in bed comfortably.    Objective: Filed Vitals:   01/25/16 0917 01/25/16 0919 01/25/16 1209 01/25/16 1213  BP:      Pulse:      Temp:   98.8 F (37.1 C) 101.8 F (38.8 C)  TempSrc:   Oral Oral  Resp:      Height:      Weight:      SpO2: 100% 100%      Intake/Output Summary (Last 24 hours) at 01/25/16 1214 Last data filed at 01/25/16 0755  Gross per 24 hour  Intake    360 ml  Output   3300 ml  Net  -2940 ml   Filed Weights   01/20/16 0843 01/21/16 0412 01/22/16 0512  Weight: 75.116 kg (165 lb 9.6 oz) 76.613 kg (168 lb 14.4 oz) 78.744 kg (173 lb 9.6 oz)    Examination:  General: A/O 4, NAD, positive acute on chronic Respiratory distress (on 10 L via high flow [place]) Eyes: negative scleral hemorrhage, negative anisocoria, negative icterus ENT: Negative Runny nose, negative gingival bleeding, Neck:  Negative scars, masses, torticollis, lymphadenopathy, JVD Lungs: Clear to auscultation bilaterally, except for decreased apical breath sounds, without wheezes or crackles Cardiovascular: Tachycardic, Regular rhythm without murmur gallop or rub normal S1 and S2 Abdomen: negative abdominal pain, nondistended, positive soft, bowel sounds, no rebound, no ascites, no appreciable mass Extremities: No significant cyanosis, clubbing. Bilateral lower edema 1-2+ Lt>>Rt Skin: Negative rashes, lesions, ulcers, right occipital/crown incision closed with staples negative sign of infection Psychiatric:  Negative depression, negative anxiety, negative fatigue, negative mania  Central nervous system:  Cranial nerves II through XII intact, tongue/uvula midline, all extremities muscle strength 5/5, sensation intact throughout, negative dysarthria, negative expressive aphasia, negative receptive aphasia.  .     Data  Reviewed: Care during the described time interval was provided by me .  I have reviewed this patient's available data, including medical history, events of note, physical examination, and all test results as part of my evaluation. I have personally reviewed and interpreted all radiology studies.  CBC:  Recent Labs Lab 01/20/16 0001 01/23/16 0455 01/24/16 0520  WBC 6.2 5.0 4.3  NEUTROABS 4.9  --   --   HGB 8.4* 9.3* 8.9*  HCT 26.0* 27.9* 27.6*  MCV 94.2 93.0 94.2  PLT 115* 105* 564*   Basic Metabolic Panel:  Recent Labs Lab 01/20/16 0001 01/23/16 0455 01/24/16 0520  NA 133* 131* 134*  K 3.4* 3.9 4.1  CL 97* 96* 95*  CO2 29 30 33*  GLUCOSE 179* 106* 115*  BUN '16 14 16  '$ CREATININE 0.71 0.58* 0.47*  CALCIUM 7.9* 7.8* 8.2*   GFR: Estimated Creatinine Clearance: 83.5 mL/min (by C-G formula based on Cr of 0.47). Liver Function Tests:  Recent Labs Lab 01/20/16 0001  AST 29  ALT 28  ALKPHOS 47  BILITOT 0.5  PROT 4.4*  ALBUMIN 2.5*   No results for input(s): LIPASE, AMYLASE in the last 168 hours. No results for input(s): AMMONIA in the last 168 hours. Coagulation Profile:  Recent Labs Lab 01/20/16 0342  INR 0.98   Cardiac Enzymes: No results for input(s): CKTOTAL, CKMB, CKMBINDEX, TROPONINI in the last 168 hours. BNP (last 3 results) No results for input(s): PROBNP in the last 8760 hours. HbA1C: No results for input(s): HGBA1C in the last 72 hours. CBG:  Recent Labs Lab 01/22/16 0901  GLUCAP 122*   Lipid Profile: No results for input(s): CHOL, HDL, LDLCALC, TRIG, CHOLHDL, LDLDIRECT in the last 72 hours. Thyroid Function Tests: No results for input(s): TSH, T4TOTAL, FREET4, T3FREE, THYROIDAB in the last 72 hours. Anemia Panel: No results for input(s): VITAMINB12, FOLATE, FERRITIN, TIBC, IRON, RETICCTPCT in the last 72 hours. Urine analysis:    Component Value Date/Time   COLORURINE YELLOW 01/20/2016 0027   APPEARANCEUR CLOUDY* 01/20/2016 0027    LABSPEC 1.017 01/20/2016 0027   PHURINE 7.0 01/20/2016 0027   GLUCOSEU NEGATIVE 01/20/2016 0027   HGBUR NEGATIVE 01/20/2016 0027   BILIRUBINUR NEGATIVE 01/20/2016 0027   KETONESUR NEGATIVE 01/20/2016 0027   PROTEINUR NEGATIVE 01/20/2016 0027   UROBILINOGEN 1.0 11/11/2014 0900   NITRITE NEGATIVE 01/20/2016 0027   LEUKOCYTESUR NEGATIVE 01/20/2016 0027   Sepsis Labs: '@LABRCNTIP'$ (procalcitonin:4,lacticidven:4)  ) Recent Results (from the past 240 hour(s))  Blood Culture (routine x 2)     Status: None (Preliminary result)   Collection Time: 01/20/16 12:01 AM  Result Value Ref Range Status   Specimen Description BLOOD RIGHT HAND  Final   Special Requests BOTTLES DRAWN AEROBIC AND ANAEROBIC 5ML  Final   Culture NO GROWTH 4 DAYS  Final   Report Status PENDING  Incomplete  Blood Culture (routine x 2)     Status: None (Preliminary result)   Collection Time: 01/20/16 12:20 AM  Result Value Ref Range Status   Specimen Description BLOOD RIGHT HAND  Final   Special Requests BOTTLES DRAWN AEROBIC AND ANAEROBIC 5ML  Final   Culture NO GROWTH 4 DAYS  Final   Report Status PENDING  Incomplete  Urine culture     Status: Abnormal   Collection Time: 01/20/16 12:27 AM  Result Value Ref Range Status   Specimen Description URINE, RANDOM  Final   Special Requests NONE  Final   Culture MULTIPLE SPECIES PRESENT, SUGGEST RECOLLECTION (A)  Final   Report Status 01/21/2016 FINAL  Final  MRSA PCR Screening     Status: None   Collection Time: 01/20/16  8:04 PM  Result Value Ref Range Status   MRSA by PCR NEGATIVE NEGATIVE Final    Comment:        The GeneXpert MRSA Assay (FDA approved for NASAL specimens only), is one component of a comprehensive MRSA colonization surveillance program. It is not intended to diagnose MRSA infection nor to guide or monitor treatment for MRSA infections.   Respiratory Panel by PCR     Status: None   Collection Time: 01/22/16 10:07 AM  Result Value Ref Range Status     Adenovirus NOT DETECTED NOT DETECTED Final   Coronavirus 229E NOT DETECTED NOT DETECTED Final   Coronavirus HKU1 NOT DETECTED NOT DETECTED Final   Coronavirus NL63 NOT DETECTED NOT DETECTED Final   Coronavirus OC43 NOT DETECTED NOT DETECTED Final   Metapneumovirus NOT DETECTED NOT DETECTED Final   Rhinovirus / Enterovirus NOT DETECTED NOT DETECTED Final   Influenza A NOT DETECTED NOT DETECTED Final   Influenza A H1 NOT DETECTED NOT DETECTED Final   Influenza A H1 2009 NOT DETECTED NOT DETECTED Final   Influenza A H3 NOT DETECTED NOT DETECTED Final   Influenza B NOT  DETECTED NOT DETECTED Final   Parainfluenza Virus 1 NOT DETECTED NOT DETECTED Final   Parainfluenza Virus 2 NOT DETECTED NOT DETECTED Final   Parainfluenza Virus 3 NOT DETECTED NOT DETECTED Final   Parainfluenza Virus 4 NOT DETECTED NOT DETECTED Final   Respiratory Syncytial Virus NOT DETECTED NOT DETECTED Final   Bordetella pertussis NOT DETECTED NOT DETECTED Final   Chlamydophila pneumoniae NOT DETECTED NOT DETECTED Final   Mycoplasma pneumoniae NOT DETECTED NOT DETECTED Final         Radiology Studies: No results found.      Scheduled Meds: . amLODipine  5 mg Oral Daily  . antiseptic oral rinse  7 mL Mouth Rinse BID  . aspirin EC  81 mg Oral Daily  . budesonide  0.25 mg Nebulization BID  . cloNIDine  0.2 mg Transdermal Weekly  . cyanocobalamin  500 mcg Oral Daily  . dexamethasone  4 mg Oral QID  . dextromethorphan-guaiFENesin  1 tablet Oral BID  . docusate sodium  100 mg Oral BID  . enoxaparin (LOVENOX) injection  40 mg Subcutaneous Q24H  . famotidine  40 mg Oral BID  . feeding supplement (ENSURE ENLIVE)  237 mL Oral BID BM  . furosemide  40 mg Intravenous Q12H  . ipratropium-albuterol  3 mL Nebulization Q6H  . isosorbide mononitrate  30 mg Oral Daily  . levETIRAcetam  1,500 mg Oral BID  . nitroGLYCERIN  1 inch Topical Once  . pramipexole  1 mg Oral QHS  . senna  2 tablet Oral Daily  . sodium  chloride  2 g Oral TID WC  . tamsulosin  0.4 mg Oral Daily   Continuous Infusions:    LOS: 5 days    Time spent: 40 minutes    WOODS, Geraldo Docker, MD [facility] Pager [phone]   If 7PM-7AM, please contact night-coverage [email or web address] Password [facility] 01/25/2016, 12:14 PM

## 2016-01-25 NOTE — Progress Notes (Signed)
Patient's HR jumped into 160s intermittently. Reports no chest pain and BP slightly elevated. EKG, labs, and metoprolol ordered. States that he feels anxious gave 0.5 klonopin PRN. Patient states that he takes klonopin 2-3 times a day at home and here it is only PRN. Will see if we can get it scheduled as patient has been on it quite a while he states. Will continue to monitor. Ruben Gottron, [place] 01/25/2016 10:27 PM

## 2016-01-26 LAB — TROPONIN I: Troponin I: 0.04 ng/mL (ref <0.03)

## 2016-01-26 MED ORDER — METOPROLOL TARTRATE 50 MG PO TABS
50.0000 mg | ORAL_TABLET | Freq: Two times a day (BID) | ORAL | Status: DC
  Administered 2016-01-26 – 2016-01-28 (×5): 50 mg via ORAL
  Filled 2016-01-26 (×6): qty 1

## 2016-01-26 MED ORDER — CLONAZEPAM 0.5 MG PO TABS
0.5000 mg | ORAL_TABLET | Freq: Three times a day (TID) | ORAL | Status: DC
  Administered 2016-01-26 – 2016-01-28 (×6): 0.5 mg via ORAL
  Filled 2016-01-26 (×6): qty 1

## 2016-01-26 MED ORDER — IPRATROPIUM-ALBUTEROL 0.5-2.5 (3) MG/3ML IN SOLN
3.0000 mL | Freq: Four times a day (QID) | RESPIRATORY_TRACT | Status: DC
  Administered 2016-01-26 – 2016-01-28 (×7): 3 mL via RESPIRATORY_TRACT
  Filled 2016-01-26 (×8): qty 3

## 2016-01-26 MED ORDER — CLONIDINE HCL 0.1 MG PO TABS
0.1000 mg | ORAL_TABLET | Freq: Three times a day (TID) | ORAL | Status: DC
  Administered 2016-01-26 – 2016-01-28 (×7): 0.1 mg via ORAL
  Filled 2016-01-26 (×9): qty 1

## 2016-01-26 MED ORDER — FUROSEMIDE 40 MG PO TABS
40.0000 mg | ORAL_TABLET | Freq: Two times a day (BID) | ORAL | Status: DC
  Administered 2016-01-26 – 2016-01-28 (×4): 40 mg via ORAL
  Filled 2016-01-26 (×5): qty 1

## 2016-01-26 MED ORDER — CLONIDINE HCL 0.1 MG PO TABS: 0.1000 mg | ORAL_TABLET | Freq: Two times a day (BID) | ORAL | Status: DC

## 2016-01-26 MED ORDER — CYANOCOBALAMIN 1000 MCG/ML IJ SOLN
1000.0000 ug | Freq: Once | INTRAMUSCULAR | Status: AC
  Administered 2016-01-26: 1000 ug via SUBCUTANEOUS
  Filled 2016-01-26 (×3): qty 1

## 2016-01-26 NOTE — Progress Notes (Signed)
[name] [id number]  Transfer Data: 01/26/2016 1:59 PM  Attending Provider: Cherene Altes, MD  MCN:[id number],EZMO, PA-C  Code Status:  Full [name] is a 75 y.o. male patient transferred from 25M  -No acute distress noted.  -No complaints of shortness of breath.  -No complaints of chest pain.     Allergies: Review of patient's allergies indicates no known allergies.  Past Medical History:  has a past medical history of Arthritis; Wheezing; Sore throat; Carotid artery occlusion; COPD (chronic obstructive pulmonary disease) ([place]); Lumbar disc disease; Restless leg syndrome; Allergic rhinitis; Hypertension; Anxiety; Shortness of breath; Lung mass; GERD (gastroesophageal reflux disease); H/O hiatal hernia; Pre-operative cardiovascular examination; Nonspecific abnormal unspecified cardiovascular function study; Peripheral vascular disease, unspecified ([place]); Pneumonia (2014); On home oxygen therapy; Constipation; S/P radiation therapy (09/25/14); S/P radiation therapy (12/04/14 srs); S/P radiation therapy (07/18/15); Seizures ([place]) (12/13/15); History of chemotherapy; Headache; Occasional tremors; BPH (benign prostatic hyperplasia); Cancer ([place]) (09/14/12); and 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  Past Surgical History:  has past surgical history that includes Carotid endarterectomy (10/15/2010); Hand surgery; Video bronchoscopy (07/24/2012); Lobectomy (Right, 09/14/2012); Video assisted thoracoscopy (Right, 09/14/2012); Video bronchoscopy with endobronchial ultrasound (N/A, 08/27/2014); Scalene node biopsy (Right, 08/27/2014); sterotactic radiosurgery (Right, 09/25/14); power port a cath (Right); Colonoscopy; and Craniotomy (N/A, 01/06/2016).  Social History:  reports that he quit smoking about 17 months ago. His smoking use included Cigarettes. He has a 55 pack-year smoking history. He quit smokeless tobacco use about 63 years ago. He reports that he drinks alcohol. He reports that he does not use illicit  drugs.   Patient/Family orientated to room. Information packet given to patient/family. Admission inpatient armband information verified with patient/family to include name and date of birth and placed on patient arm. Side rails up x 2, fall assessment and education completed with patient/family. Patient/family able to verbalize understanding of risk associated with falls and verbalized understanding to call for assistance before getting out of bed. Call light within reach. Patient/family able to voice and demonstrate understanding of unit orientation instructions.  Will continue to evaluate and treat per MD orders.

## 2016-01-26 NOTE — Progress Notes (Signed)
Report called to [id number] 

## 2016-01-26 NOTE — Progress Notes (Signed)
CSW continues to follow for disposition back to [facility] when medically stable and cleared for discharge.     Emiliano Dyer, LCSW [facility] ED/14M Clinical Social Worker [phone]

## 2016-01-26 NOTE — Progress Notes (Signed)
[facility] TEAM 1 - Stepdown/ICU TEAM  VERNARD GRAM  MBE:[id number] DOB: [birth year] DOA: 01/19/2016 PCP: Beatris Si    Brief Narrative:  75 y.o. M w/ Hx Depression, Anxiety, CVA, Seizure, COPD on 2 L nasal cannula oxygen, Chronic Diastolic CHF, HTN, , NSCLC with Metastases (s/p RU Lobectomy / XRT / chemo), Brain Metastases (s/p craniotomy on 01/16/16+ XRT), BPH, and GERD who presented with shortness of breath and cough for 2 days.   ED Course: pt was found to have WBC 6.2, temperature 90.9, bradycardia, tachypnea, potassium 3.4, creatinine normal, low blood pressure 86/63 which improved to 119/65 with 1 L normal saline bolus.   # CT-chest showed clusters of nodularity in the right middle lobe and lingula, new from prior study and most likely representing pneumonia. Metastatic disease is much less likely given rapid interval development of these nodules. stable postsurgical changes of right upper lobectomy and stable small bilateral pleural effusions.  # CT head showed postoperative changes in the right posterior parietal region with right posterior parietal intraparenchymal and subdural hematomas demonstrating typical evolutional changes since previous study. No evidence of progression. No developing mass effect.  Subjective: The pt c/o signif uncontrolled anxiety.  He denies shortness of breath when he is not anxious.  He denies chest pain abdominal pain nausea or vomiting.  He states he has a reasonable appetite.  He tells me he takes Klonopin 3 times a day at home on a scheduled basis.  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 [facility] who stated Dr. Earlie Server will see  him in AM  -Patient last seen June 2017  COPD exacerbation -appears well compensated at this time   Pulmonary edema - no evidence of CHF  -TTE 12/30/15 noted normal diastolic fxn w/ EF 00-71% - net negative ~12.5L since admit - no evidence of signif heart failure or pulmonary edema at this time  [facility] Weights   01/20/16 0843 01/21/16 0412 01/22/16 0512  Weight: 75.116 kg (165 lb 9.6 oz) 76.613 kg (168 lb 14.4 oz) 78.744 kg (173 lb 9.6 oz)    ?HCAP  - no definite infiltrate on CXR and procalcitonin negative - off Abx  HTN crisis -Blood pressure remains quite labile and at times very poorly controlled with systolics in the 219 range - adjust treatment again today and follow - likely directly related to severe anxiety  Anemia of critical illness and chronic disease - Hemoglobin stable  Goals of care -PC has met w/ pt who continues to desire FULL CODE   DVT prophylaxis: lovenox  Code Status: FULL CODE Family Communication: Spoke with wife at bedside Disposition Plan: Transfer to medical bed - titrate oxygen down as able - titrate anxiety medications - follow blood pressure  Consultants:  PCCM Neurosurgery Oncology  Procedures: none  Antimicrobials:  Cefepime 7/10 > 7/13 Vanc 7/10 > 7/14  Objective: Blood pressure 157/77, pulse 62, temperature 98.3 F (36.8 C), temperature source Oral, resp. rate 17, height '5\' 9"'  (1.753 m), weight 78.744 kg (173 lb 9.6 oz), SpO2 100 %.  Intake/Output Summary (Last 24 hours) at 01/26/16 0936 Last data filed at 01/26/16 0800  Gross per 24 hour  Intake    330 ml  Output  3675 ml  Net  -3345 ml   Filed Weights   01/20/16 0843 01/21/16 0412 01/22/16 0512  Weight: 75.116 kg (165 lb 9.6 oz) 76.613 kg (168 lb 14.4 oz) 78.744 kg (173 lb 9.6 oz)    Examination: General: No acute respiratory distress at rest in bed  Lungs: Scattered crackles in multiple areas with no wheeze with distant breath sounds throughout Cardiovascular: Regular  rate and rhythm without murmur gallop or rub normal S1 and S2 Abdomen: Nontender, nondistended, soft, bowel sounds positive, no rebound, no ascites, no appreciable mass Extremities: No significant cyanosis, clubbing, or edema bilateral lower extremities  CBC:  Recent Labs Lab 01/20/16 0001 01/23/16 0455 01/24/16 0520 01/25/16 2212  WBC 6.2 5.0 4.3 4.3  NEUTROABS 4.9  --   --   --   HGB 8.4* 9.3* 8.9* 9.0*  HCT 26.0* 27.9* 27.6* 28.1*  MCV 94.2 93.0 94.2 94.0  PLT 115* 105* 110* 161*   Basic Metabolic Panel:  Recent Labs Lab 01/20/16 0001 01/23/16 0455 01/24/16 0520 01/25/16 2212  NA 133* 131* 134* 133*  K 3.4* 3.9 4.1 3.7  CL 97* 96* 95* 91*  CO2 29 30 33* 36*  GLUCOSE 179* 106* 115* 118*  BUN '16 14 16 18  ' CREATININE 0.71 0.58* 0.47* 0.53*  CALCIUM 7.9* 7.8* 8.2* 7.9*  MG  --   --   --  2.0   GFR: Estimated Creatinine Clearance: 83.5 mL/min (by C-G formula based on Cr of 0.53).  Liver Function Tests:  Recent Labs Lab 01/20/16 0001  AST 29  ALT 28  ALKPHOS 47  BILITOT 0.5  PROT 4.4*  ALBUMIN 2.5*   No results for input(s): LIPASE, AMYLASE in the last 168 hours. No results for input(s): AMMONIA in the last 168 hours.  Coagulation Profile:  Recent Labs Lab 01/20/16 0342  INR 0.98    Cardiac Enzymes:  Recent Labs Lab 01/25/16 2212 01/26/16 0416  TROPONINI 0.04* 0.04*    HbA1C: HGB A1C MFR BLD  Date/Time Value Ref Range Status  01/16/2016 05:05 AM 5.7* 4.8 - 5.6 % Final    Comment:    (NOTE)         Pre-diabetes: 5.7 - 6.4         Diabetes: >6.4         Glycemic control for adults with diabetes: <7.0   01/15/2016 04:30 AM 5.9* 4.8 - 5.6 % Final    Comment:    (NOTE)         Pre-diabetes: 5.7 - 6.4         Diabetes: >6.4         Glycemic control for adults with diabetes: <7.0     CBG:  Recent Labs Lab 01/22/16 0901  GLUCAP 122*    Recent Results (from the past 240 hour(s))  Blood Culture (routine x 2)     Status: None    Collection Time: 01/20/16 12:01 AM  Result Value Ref Range Status   Specimen Description BLOOD RIGHT HAND  Final   Special Requests BOTTLES DRAWN AEROBIC AND ANAEROBIC 5ML  Final   Culture NO GROWTH 5 DAYS  Final   Report Status 01/25/2016 FINAL  Final  Blood Culture (routine x 2)     Status: None   Collection Time: 01/20/16 12:20 AM  Result Value Ref Range Status   Specimen Description BLOOD RIGHT HAND  Final   Special Requests BOTTLES DRAWN AEROBIC AND ANAEROBIC 5ML  Final   Culture NO GROWTH 5  DAYS  Final   Report Status 01/25/2016 FINAL  Final  Urine culture     Status: Abnormal   Collection Time: 01/20/16 12:27 AM  Result Value Ref Range Status   Specimen Description URINE, RANDOM  Final   Special Requests NONE  Final   Culture MULTIPLE SPECIES PRESENT, SUGGEST RECOLLECTION (A)  Final   Report Status 01/21/2016 FINAL  Final  MRSA PCR Screening     Status: None   Collection Time: 01/20/16  8:04 PM  Result Value Ref Range Status   MRSA by PCR NEGATIVE NEGATIVE Final    Comment:        The GeneXpert MRSA Assay (FDA approved for NASAL specimens only), is one component of a comprehensive MRSA colonization surveillance program. It is not intended to diagnose MRSA infection nor to guide or monitor treatment for MRSA infections.   Respiratory Panel by PCR     Status: None   Collection Time: 01/22/16 10:07 AM  Result Value Ref Range Status   Adenovirus NOT DETECTED NOT DETECTED Final   Coronavirus 229E NOT DETECTED NOT DETECTED Final   Coronavirus HKU1 NOT DETECTED NOT DETECTED Final   Coronavirus NL63 NOT DETECTED NOT DETECTED Final   Coronavirus OC43 NOT DETECTED NOT DETECTED Final   Metapneumovirus NOT DETECTED NOT DETECTED Final   Rhinovirus / Enterovirus NOT DETECTED NOT DETECTED Final   Influenza A NOT DETECTED NOT DETECTED Final   Influenza A H1 NOT DETECTED NOT DETECTED Final   Influenza A H1 2009 NOT DETECTED NOT DETECTED Final   Influenza A H3 NOT DETECTED NOT  DETECTED Final   Influenza B NOT DETECTED NOT DETECTED Final   Parainfluenza Virus 1 NOT DETECTED NOT DETECTED Final   Parainfluenza Virus 2 NOT DETECTED NOT DETECTED Final   Parainfluenza Virus 3 NOT DETECTED NOT DETECTED Final   Parainfluenza Virus 4 NOT DETECTED NOT DETECTED Final   Respiratory Syncytial Virus NOT DETECTED NOT DETECTED Final   Bordetella pertussis NOT DETECTED NOT DETECTED Final   Chlamydophila pneumoniae NOT DETECTED NOT DETECTED Final   Mycoplasma pneumoniae NOT DETECTED NOT DETECTED Final     Scheduled Meds: . amLODipine  5 mg Oral Daily  . antiseptic oral rinse  7 mL Mouth Rinse BID  . aspirin EC  81 mg Oral Daily  . budesonide  0.25 mg Nebulization BID  . clonazePAM  0.5 mg Oral BID  . cloNIDine  0.2 mg Transdermal Weekly  . cyanocobalamin  500 mcg Oral Daily  . dexamethasone  4 mg Oral QID  . dextromethorphan-guaiFENesin  1 tablet Oral BID  . docusate sodium  100 mg Oral BID  . enoxaparin (LOVENOX) injection  40 mg Subcutaneous Q24H  . famotidine  40 mg Oral BID  . feeding supplement (ENSURE ENLIVE)  237 mL Oral BID BM  . furosemide  40 mg Intravenous Q12H  . ipratropium-albuterol  3 mL Nebulization Q6H  . isosorbide mononitrate  30 mg Oral Daily  . levETIRAcetam  1,500 mg Oral BID  . nitroGLYCERIN  1 inch Topical Once  . pramipexole  1 mg Oral QHS  . senna  2 tablet Oral Daily  . sodium chloride  2 g Oral TID WC  . tamsulosin  0.4 mg Oral Daily     LOS: 6 days    Cherene Altes, MD [facility] Office  [phone] Pager - Text Page per Shea Evans as per below:  On-Call/Text Page:      [email or web address]      password [facility]  If 7PM-7AM, please contact night-coverage [email or web address] Password [facility] 01/26/2016, 9:36 AM

## 2016-01-26 NOTE — Progress Notes (Signed)
Physical Therapy Treatment Patient Details Name: [name] MRN: [id number] DOB: [birth year] Today's Date: 01/26/2016    History of Present Illness Patient is a 75 y/o male with recurrent lung ca with brain mets, COPD, restless leg syndrome, anxiety, s/p craniotomy with resection of a right occipital mass. Pt presents this admission with SOB and cough. Suspect developing pneumonia based on CT chest.      PT Comments    Pt admitted with above diagnosis. Pt currently with functional limitations due to balance and endurance deficits. Pt was able to sit EOB 8 minutes with varying min guard for brief periods of time to mod assist when pt was leaning to left. Pt weak and deconditioned but was able to perform some exercises EOB as well as PT rubbed back with lotion.  Will continue acute PT.    Pt will benefit from skilled PT to increase their independence and safety with mobility to allow discharge to the venue listed below.    Follow Up Recommendations  SNF     Equipment Recommendations  None recommended by PT    Recommendations for Other Services       Precautions / Restrictions Precautions Precautions: Fall Precaution Comments: left visual field deficits; left inattention Restrictions Weight Bearing Restrictions: No    Mobility  Bed Mobility Overal bed mobility: Needs Assistance Bed Mobility: Supine to Sit;Sit to Supine     Supine to sit: Mod assist;HOB elevated Sit to supine: Mod assist;+2 for physical assistance   General bed mobility comments: Pt requires assist to lift trunk and scoot to EOB.  Constant support as pt leans left.   Transfers                    Ambulation/Gait                 Stairs            Wheelchair Mobility    Modified Rankin (Stroke Patients Only) Modified Rankin (Stroke Patients Only) Pre-Morbid Rankin Score: Severe disability Modified Rankin: Severe disability     Balance Overall balance assessment: Needs  assistance Sitting-balance support: Bilateral upper extremity supported;Feet supported Sitting balance-Leahy Scale: Poor Sitting balance - Comments: Pt requires min - mod A for EOB sitting.  He demonstrates posterior bias as well as left lateral lean.  Did achieve min guard assist for seconds at a time after PT had pt prop on right elbow for a brief time.  Postural control: Posterior lean;Left lateral lean                          Cognition Arousal/Alertness: Awake/alert Behavior During Therapy: WFL for tasks assessed/performed Overall Cognitive Status: Impaired/Different from baseline Area of Impairment: Attention;Following commands;Safety/judgement;Awareness;Problem solving Orientation Level: Time Current Attention Level: Sustained Memory: Decreased recall of precautions Following Commands: Follows multi-step commands inconsistently Safety/Judgement: Decreased awareness of safety;Decreased awareness of deficits Awareness: Emergent Problem Solving: Difficulty sequencing;Requires verbal cues;Requires tactile cues General Comments: Pt  very irritable, difficult to redirect.  Poor awareness of deficits, mod A for problem solving    Exercises General Exercises - Lower Extremity Ankle Circles/Pumps: AROM;Both;10 reps;Seated Long Arc Quad: AROM;Both;10 reps;Seated Hip Flexion/Marching: AROM;Both;10 reps;Seated    General Comments        Pertinent Vitals/Pain Pain Assessment: Faces Faces Pain Scale: Hurts little more Pain Location: generalized Pain Descriptors / Indicators: Grimacing;Guarding Pain Intervention(s): Limited activity within patient's tolerance;Monitored during session;Repositioned  VSS     Home  Living                      Prior Function            PT Goals (current goals can now be found in the care plan section) Progress towards PT goals: Progressing toward goals    Frequency  Min 3X/week    PT Plan Current plan remains appropriate     Co-evaluation             End of Session Equipment Utilized During Treatment: Gait belt;Oxygen Activity Tolerance: Patient limited by fatigue Patient left: in bed;with call bell/phone within reach;with bed alarm set;with SCD's reapplied     Time: 1771-1657 PT Time Calculation (min) (ACUTE ONLY): 17 min  Charges:  $Therapeutic Activity: 8-22 mins                    G CodesDenice Paradise 02-07-2016, 4:28 PM Dore Oquin,PT Acute Rehabilitation [phone] (pager)

## 2016-01-26 NOTE — Care Management Important Message (Signed)
Important Message  Patient Details  Name: [name] MRN: [id number] Date of Birth: 1941-01-17   Medicare Important Message Given:  Yes    Nathen May 01/26/2016, 2:55 PM

## 2016-01-27 ENCOUNTER — Telehealth: Payer: Self-pay | Admitting: *Deleted

## 2016-01-27 DIAGNOSIS — C7931 Secondary malignant neoplasm of brain: Secondary | ICD-10-CM

## 2016-01-27 LAB — BASIC METABOLIC PANEL
Anion gap: 5 (ref 5–15)
BUN: 22 mg/dL — AB (ref 6–20)
CALCIUM: 8.2 mg/dL — AB (ref 8.9–10.3)
CHLORIDE: 94 mmol/L — AB (ref 101–111)
CO2: 35 mmol/L — AB (ref 22–32)
CREATININE: 0.57 mg/dL — AB (ref 0.61–1.24)
GFR calc non Af Amer: >60 mL/min (ref >60)
Glucose, Bld: 126 mg/dL — ABNORMAL HIGH (ref 65–99)
Potassium: 4.3 mmol/L (ref 3.5–5.1)
Sodium: 134 mmol/L — ABNORMAL LOW (ref 135–145)

## 2016-01-27 LAB — CBC
HEMATOCRIT: 25.1 % — AB (ref 39.0–52.0)
HEMOGLOBIN: 8 g/dL — AB (ref 13.0–17.0)
MCH: 30.4 pg (ref 26.0–34.0)
MCHC: 31.9 g/dL (ref 30.0–36.0)
MCV: 95.4 fL (ref 78.0–100.0)
Platelets: 98 10*3/uL — ABNORMAL LOW (ref 150–400)
RBC: 2.63 MIL/uL — ABNORMAL LOW (ref 4.22–5.81)
RDW: 15.6 % — AB (ref 11.5–15.5)
WBC: 3.2 10*3/uL — ABNORMAL LOW (ref 4.0–10.5)

## 2016-01-27 MED ORDER — SODIUM CHLORIDE 0.9% FLUSH
10.0000 mL | INTRAVENOUS | Status: DC | PRN
  Administered 2016-01-27: 10 mL
  Filled 2016-01-27: qty 40

## 2016-01-27 NOTE — Telephone Encounter (Signed)
Called wife back, discussed with wife Personality is changing due to the Brain mets. Pt is being very difficult, yelling at wife telling wife and kids to leave him alone and he wants to go home. Pt telling wife and kids to get out of the room that he wants to get divorced to draw up papers and leave him alone. Pt wife very sad at the hurtful comments made to her by the pt. She verbalized she will explore more options with palliative care team. Discussed with wife pt cannot get chemotherapy until he is discharged from hospital and seen by MD and treatment plan is discussed. If pt goes to rehab facility, again he must be discharged from rehab f/u with MD and discuss treatment plan. Wife verbalized understanding. No further concerns.

## 2016-01-27 NOTE — Consult Note (Signed)
   [facility] Inpatient Consult   01/27/2016  [name] 02/26/1941 [id number]  Patient screened for potential [facility] Management services. Patient is eligible for [facility] Care Management services under patient's Medicare  Plan. Chart review reveals 75 y.o. male with past medical history of Arthritis, carotid artery occlusion, severe COPD, restless leg syndrome, lumbar disc disease, hypertension, GERD, peripheral vascular disease, cancer (initially diagnosed in 2014), now with return of disease to brain admitted on 01/06/2016 with left-sided weakness and tonic-clonic seizures that required intubation. He underwent craniotomy on 6/27. He returned this admission after discharge only a few days to a skilled facility with worsening respiratory status.  Chart review also reveals patient has a consult with Palliative Care and plans to return to a skilled nursing facility.    Please place a [facility] Care Management consult or for questions contact:   Natividad Brood, RN BSN [facility] Liaison  [phone] business mobile phone Toll free office [phone]

## 2016-01-27 NOTE — Progress Notes (Signed)
Daily Progress Note   Patient Name: [name]       Date: 01/27/2016 DOB: 1940/11/10  Age: 75 y.o. MRN#: [id number] Attending Physician: Robbie Lis, MD Primary Care Physician: Beatris Si Admit Date: 01/19/2016  Reason for Consultation/Follow-up: Establishing goals of care  Subjective: I met again today with [name], no family at bedside. He is still quite agitated at times but this seems improved from last week. He asks what the next step is and we discuss that once discharged the plan is to return to [facility] for rehab. He again argues that he doesn't want to go back (we have had this conversation numerous times) but in the end understands there are not really any better options and home is not an option at this point. He still became agitated and ill when discussing his family. I encourage him to be careful about isolating the only people that are there for him and are helping to make sure he has what he needs to make life as easy for him as possible. He was not very receptive. Emotional support provided.   Length of Stay: 7  Current Medications: Scheduled Meds:  . amLODipine  5 mg Oral Daily  . antiseptic oral rinse  7 mL Mouth Rinse BID  . aspirin EC  81 mg Oral Daily  . budesonide  0.25 mg Nebulization BID  . clonazePAM  0.5 mg Oral TID  . cloNIDine  0.1 mg Oral TID  . cyanocobalamin  500 mcg Oral Daily  . dexamethasone  4 mg Oral QID  . dextromethorphan-guaiFENesin  1 tablet Oral BID  . docusate sodium  100 mg Oral BID  . enoxaparin (LOVENOX) injection  40 mg Subcutaneous Q24H  . famotidine  40 mg Oral BID  . feeding supplement (ENSURE ENLIVE)  237 mL Oral BID BM  . furosemide  40 mg Oral BID  . ipratropium-albuterol  3 mL Nebulization QID  . isosorbide  mononitrate  30 mg Oral Daily  . levETIRAcetam  1,500 mg Oral BID  . metoprolol  50 mg Oral BID  . pramipexole  1 mg Oral QHS  . senna  2 tablet Oral Daily  . sodium chloride  2 g Oral TID WC  . tamsulosin  0.4 mg Oral Daily    Continuous Infusions:  PRN Meds: HYDROcodone-acetaminophen, ipratropium-albuterol, labetalol, sodium chloride, sodium chloride flush, triamcinolone cream, zolpidem  Physical Exam  Constitutional: He appears well-developed.  HENT:  Head: Normocephalic and atraumatic.  Cardiovascular: Normal rate.   Pulmonary/Chest: No accessory muscle usage. No respiratory distress. He has rhonchi.  Still congested, less distress  Abdominal: Normal appearance.  Neurological: He is alert.  Appears to be mostly oriented but in denial and becomes fixated easily.             Vital Signs: BP 106/62 mmHg  Pulse 81  Temp(Src) 97.5 F (36.4 C) (Oral)  Resp 16  Ht 5' 9.5" (1.765 m)  Wt 74.254 kg (163 lb 11.2 oz)  BMI 23.84 kg/m2  SpO2 98% SpO2: SpO2: 98 % O2 Device: O2 Device: Nasal Cannula O2 Flow Rate: O2 Flow Rate (L/min): 4 L/min  Intake/output summary:   Intake/Output Summary (Last 24 hours) at 01/27/16 1024 Last data filed at 01/27/16 0500  Gross per 24 hour  Intake    360 ml  Output   1050 ml  Net   -690 ml   LBM: Last BM Date: 01/26/16 Baseline Weight: Weight: 75.116 kg (165 lb 9.6 oz) Most recent weight: Weight: 74.254 kg (163 lb 11.2 oz)       Palliative Assessment/Data:    Flowsheet Rows        Most Recent Value   Intake Tab    Referral Department  Hospitalist   Unit at Time of Referral  Cardiac/Telemetry Unit   Palliative Care Primary Diagnosis  Cancer   Date Notified  01/20/16   Palliative Care Type  Return patient Palliative Care   Reason for referral  Clarify Goals of Care   Date of Admission  01/19/16   Date first seen by Palliative Care  01/21/16   # of days Palliative referral response time  1 Day(s)   # of days IP prior to  Palliative referral  1   Clinical Assessment    Psychosocial & Spiritual Assessment    Palliative Care Outcomes       Patient Active Problem List   Diagnosis Date Noted  . HCAP (healthcare-associated pneumonia) 01/20/2016  . BPH (benign prostatic hyperplasia) 01/20/2016  . Anemia 01/20/2016  . Thrombocytopenia ([place]) 01/15/2016  . Hyponatremia 01/15/2016  . Chronic respiratory failure with hypoxia ([place]) 01/14/2016  . DNR (do not resuscitate) discussion   . Malnutrition of moderate degree 01/12/2016  . Palliative care encounter   . Acute confusion   . S/P craniotomy 01/06/2016  . Acute on chronic respiratory failure ([place]) 01/06/2016  . COPD exacerbation ([place]) 12/30/2015  . Acute exacerbation of COPD with asthma ([place]) 12/30/2015  . Acute exacerbation of CHF (congestive heart failure) ([place]) 12/30/2015  . Leukocytosis 12/30/2015  . COPD mixed type ([place]) 12/25/2015  . Edema 12/25/2015  . New onset seizure ([place])   . Malignant neoplasm of lung ([place])   . Benign essential HTN   . Acute left hemiparesis ([place])   . Left hemiparesis ([place]) 12/13/2015  . Respiratory failure ([place]) 12/13/2015  . Brain metastases ([place])   . Volume overload 09/26/2015  . Hand foot syndrome 09/26/2015  . Malignant neoplasm of hilus of lung ([place]) 09/25/2015  . Unintentional weight loss 09/10/2015  . Hypertension 09/10/2015  . Encounter for antineoplastic chemotherapy 07/20/2015  . 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 Metastatic lung cancer (metastasis from lung to other site) [facility])   . Poor venous access   . Epistaxis 05/30/2015  .  Extravasation, infusion or chemotherapeutic agent 05/09/2015  . Hypokalemia 04/02/2015  . Constipation 01/29/2015  . Insomnia 01/29/2015  . Cancer associated pain 12/30/2014  . Rash 12/30/2014  . Syncope 12/30/2014  . Encounter for antineoplastic immunotherapy 12/16/2014  . UTI (lower urinary tract infection) 11/11/2014  . Sepsis ([place]) 11/11/2014  .  Left arm cellulitis   . Blood poisoning ([place])   . Dehydration 10/12/2014  . Renal stone 10/05/2014  . Pyelonephritis 10/05/2014  . Chemotherapy induced neutropenia ([place]) 09/18/2014  . Dyspnea 09/11/2014  . Brain metastasis ([place]) 09/11/2014  . Tachycardia 06/21/2014  . Carotid stenosis 05/01/2014  . Aftercare following surgery of the circulatory system 05/01/2014  . Weakness-Hand and  Legs 05/01/2014  . Coronary atherosclerosis of native coronary artery 03/07/2014  . Essential hypertension, benign 03/07/2014  . Tobacco use disorder 03/07/2014  . Neoplasm of upper lobe of right lung ([place]) 10/26/2012  . Peripheral vascular disease, unspecified ([place])   . COPD (chronic obstructive pulmonary disease) ([place]) 11/16/2011  . Occlusion and stenosis of carotid artery without mention of cerebral infarction 05/12/2011    Palliative Care Assessment & Plan   Patient Profile: 75 y.o. male with past medical history of Arthritis, carotid artery occlusion, severe COPD, restless leg syndrome, lumbar disc disease, hypertension, GERD, peripheral vascular disease, cancer (initially diagnosed in 2014), now with return of disease to brain admitted on 01/06/2016 with left-sided weakness and tonic-clonic seizures that required intubation. He underwent craniotomy on 6/27. He returned this admission after discharge only a few days with worsening respiratory status.   Assessment: Lying in bed, eating breakfast.    Recommendations/Plan:  May consider Zyprexa if he continues to be agitated, anxious, and lashing out to family. Also may consider psych consult. Will follow up tomorrow.   Goals of Care and Additional Recommendations:  Limitations on Scope of Treatment: Full Scope Treatment  Code Status:    Code Status Orders        Start     Ordered   01/20/16 0313  Full code   Continuous     01/20/16 0313    Code Status History    Date Active Date Inactive Code Status Order ID Comments User Context    01/06/2016  1:43 PM 01/16/2016  8:32 PM Full Code [id number]  Kary Kos, MD Inpatient   12/30/2015  5:44 AM 12/30/2015  9:03 PM Full Code [id number]  Norval Morton, MD Inpatient   12/30/2015  5:36 AM 12/30/2015  5:44 AM Full Code [id number]  Norval Morton, MD ED   12/13/2015  4:49 PM 12/16/2015  5:49 PM Full Code [id number]  Grace Bushy Minor, NP Inpatient   11/11/2014  1:45 PM 11/14/2014  2:26 PM Full Code [id number]  Verlee Monte, MD Inpatient   09/14/2012 12:34 PM 09/20/2012  2:03 PM Full Code [id number]  John Giovanni, PA-C Inpatient       Prognosis:   < 6 months  Discharge Planning:  [place] for rehab with Palliative care service follow-up  Thank you for allowing the Palliative Medicine Team to assist in the care of this patient.   Time In: 0945 Time Out: 1010 Total Time 54mn Prolonged Time Billed  no       Greater than 50%  of this time was spent counseling and coordinating care related to the above assessment and plan.  PPershing Proud NP  Please contact Palliative Medicine Team phone at [phone] questions and concerns.

## 2016-01-27 NOTE — Progress Notes (Signed)
Physical Therapy Treatment Patient Details Name: [name] MRN: [id number] DOB: 11/02/1940 Today's Date: 01/27/2016    History of Present Illness Patient is a 75 y/o male with recurrent lung ca with brain mets, COPD, restless leg syndrome, anxiety, s/p craniotomy with resection of a right occipital mass. Pt presents this admission with SOB and cough. Suspect developing pneumonia based on CT chest.      PT Comments    [name] is very weak and debilitated; Began transfer training OOB to chair via lateral scoot with +2 assist;  Continue to recommend SNF for post-acute rehab  Follow Up Recommendations  SNF     Equipment Recommendations  None recommended by PT    Recommendations for Other Services OT consult     Precautions / Restrictions Precautions Precautions: Fall Precaution Comments: left visual field deficits; left inattention    Mobility  Bed Mobility Overal bed mobility: Needs Assistance Bed Mobility: Supine to Sit     Supine to sit: Mod assist;HOB elevated     General bed mobility comments: Pt requires assist to lift trunk and scoot to EOB.  Constant support as pt leans left.   Transfers Overall transfer level: Needs assistance Equipment used:  (bed pad) Transfers: Sit to/from Stand;Lateral/Scoot Transfers Sit to Stand: +2 physical assistance;Total assist        Lateral/Scoot Transfers: +2 physical assistance;Max assist General transfer comment: Attempted sit to stand to [facility] with +2 assist, however pt quite weak and unable to initiate push through LEs to stand; Opted then for lateral scoot transfer OOB to recliner with +2 assist, bed pad and armrest of recliner dropped  Ambulation/Gait                 Stairs            Wheelchair Mobility    Modified Rankin (Stroke Patients Only) Modified Rankin (Stroke Patients Only) Pre-Morbid Rankin Score: Severe disability Modified Rankin: Severe disability     Balance Overall  balance assessment: Needs assistance Sitting-balance support: Bilateral upper extremity supported;Feet supported Sitting balance-Leahy Scale: Poor Sitting balance - Comments: Noting tending to push with RUE towards L side -- resmebling Pusher's Syndrome Postural control: Right lateral lean;Left lateral lean   Standing balance-Leahy Scale: Zero                      Cognition Arousal/Alertness: Awake/alert Behavior During Therapy: WFL for tasks assessed/performed Overall Cognitive Status: Impaired/Different from baseline Area of Impairment: Attention;Following commands;Safety/judgement;Awareness;Problem solving   Current Attention Level: Sustained Memory: Decreased recall of precautions Following Commands: Follows multi-step commands inconsistently Safety/Judgement: Decreased awareness of safety;Decreased awareness of deficits Awareness: Emergent Problem Solving: Difficulty sequencing;Requires verbal cues;Requires tactile cues      Exercises      General Comments General comments (skin integrity, edema, etc.): O2 sats 98-99% on supplemental O2      Pertinent Vitals/Pain Pain Assessment: Faces Faces Pain Scale: Hurts a little bit Pain Location: generalized Pain Descriptors / Indicators: Grimacing Pain Intervention(s): Monitored during session    Home Living                      Prior Function            PT Goals (current goals can now be found in the care plan section) Acute Rehab PT Goals Patient Stated Goal: to go home one day  PT Goal Formulation: With patient Time For Goal Achievement: 02/03/16 Potential to Achieve Goals: Good  Progress towards PT goals: Progressing toward goals    Frequency  Min 3X/week    PT Plan Current plan remains appropriate    Co-evaluation             End of Session Equipment Utilized During Treatment: Gait belt;Oxygen Activity Tolerance: Patient limited by fatigue Patient left: in chair;with call bell/phone  within reach;with chair alarm set     Time: 0761-5183 PT Time Calculation (min) (ACUTE ONLY): 28 min  Charges:  $Therapeutic Activity: 23-37 mins                    G Codes:      [name] 01/27/2016, 3:59 PM  [name], [place] Pager [phone] Office [phone]

## 2016-01-27 NOTE — Telephone Encounter (Signed)
Pt wife called states "Andra is in [phone] and i want to speak with MD because his personality has changed" Message given to MD for review.

## 2016-01-27 NOTE — Progress Notes (Addendum)
Patient ID: [name], male   DOB: [birth year], 75 y.o.   MRN: [id number]  PROGRESS NOTE    [name]  PYP:[id number] DOB: [birth year] DOA: 01/19/2016  PCP: Beatris Si   Brief Narrative:   75 y.o. Male with past medical history anxiety and depression, hypertension, seizures, COPD on 2 L [place] oxygen, chronic diastolic CHF, NSCLC with metastases( s/p RU lobectomy, XRT, chemo), brain metastases, s/p craniotomy on 01/16/2016 who presented to [facility] with shortness of breath and cough for past 2 days prior to this admission.   Pt had temp on admission 90.9, low blood pressure 86/63 which improved to 119/65 with 1 L normal saline bolus. Pt was also bradycardic, tachypneic. Blood work was notable for WBC 6.2, potassium 3.4, creatinine was normal. CT chest showed clusters of nodularity in the right middle lobe and lingula, new from prior study and most likely representing pneumonia. CT head showed postoperative changes in the right posterior parietal region with right posterior parietal intraparenchymal and subdural hematomas demonstrating typical evolutional changes since previous study. No evidence of progression. No developing mass effect.  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  - Continue  Decadron 4 mg by mouth 4 times a day  Pulmonary edema / Chronic diastolic CHF - As noted above, stable respiratory status - Patient had 2-D echo in June 2017 with ejection fraction of 55%. He is 2-D echo in 2016 showed grade 1 diastolic dysfunction - CHF compensated at this time  HCAP, unspecified organism - Completed antibiotics, please refer to anti-infectives section below  - Sepsis ruled out  Accelerated hypertension - Blood pressure currently controlled with Norvasc 5 mg daily, clonidine 0.1 mg 3 times daily, Lasix 40 mg twice daily, isosorbide mononitrate 30 mg daily and metoprolol 50 mg twice daily  Anemia of critical illness and chronic disease - Hemoglobin stable   Seizure disorder  - Continue Keppra 1500 mg twice daily   Anxiety and depression - Continue Klonopin 3 times a day   DVT prophylaxis: Lovenox suBQ Code Status: full code  Family Communication: no family at the bedside this am Disposition Plan: SNF placement likely in next 24-48 hours, depending on facility and bed availability    Consultants:   PCCM  Neurosurgery  Oncology  Procedures:   None   Antimicrobials:   Cefepime 7/10 > 7/13  Vanc 7/10 > 7/14   Subjective: No overnight events.  Objective: Filed Vitals:   01/26/16 2128 01/26/16 2147 01/27/16 0528 01/27/16 0812  BP: 103/66  105/66   Pulse: 69  58   Temp:  98.2 F (36.8 C) 97.5 F (36.4 C)   TempSrc:  Oral Oral   Resp:   16   Height:      Weight:      SpO2: 100%  99% 98%    Intake/Output Summary (Last 24 hours) at 01/27/16 0946  Last data filed at 01/27/16 0500  Gross per 24 hour  Intake    370 ml  Output   1050 ml  Net   -680 ml   Filed Weights   01/21/16 0412 01/22/16 0512 01/26/16 1420  Weight: 76.613 kg (168 lb 14.4 oz) 78.744 kg (173 lb 9.6 oz) 74.254 kg (163 lb 11.2 oz)    Examination:  General exam: Appears calm and comfortable  Respiratory system: crackles at bases, no wheezing  Cardiovascular system:  S1 & S2 heard, Rate cotnrolled Gastrointestinal system: (+) BS, non tender  Central nervous system: No focal neurological deficits. Extremities: Symmetric 5 x 5 power. Skin: No rashes, lesions or ulcers Psychiatry: Normal mood and behavior   Data Reviewed: I have personally reviewed following labs and imaging studies  CBC:  Recent Labs Lab 01/23/16 0455 01/24/16 0520 01/25/16 2212 01/27/16 0430  WBC 5.0 4.3 4.3 3.2*  HGB 9.3* 8.9* 9.0* 8.0*  HCT 27.9* 27.6* 28.1* 25.1*  MCV 93.0 94.2 94.0 95.4  PLT 105* 110* 111* 98*   Basic Metabolic Panel:  Recent Labs Lab 01/23/16 0455 01/24/16 0520 01/25/16 2212 01/27/16 0430  NA 131* 134* 133* 134*  K 3.9 4.1 3.7 4.3  CL 96* 95* 91* 94*  CO2 30 33* 36* 35*  GLUCOSE 106* 115* 118* 126*  BUN '14 16 18 '$ 22*  CREATININE 0.58* 0.47* 0.53* 0.57*  CALCIUM 7.8* 8.2* 7.9* 8.2*  MG  --   --  2.0  --    GFR: Estimated Creatinine Clearance: 84.9 mL/min (by C-G formula based on Cr of 0.57). Liver Function Tests: No results for input(s): AST, ALT, ALKPHOS, BILITOT, PROT, ALBUMIN in the last 168 hours. No results for input(s): LIPASE, AMYLASE in the last 168 hours. No results for input(s): AMMONIA in the last 168 hours. Coagulation Profile: No results for input(s): INR, PROTIME in the last 168 hours. Cardiac Enzymes:  Recent Labs Lab 01/25/16 2212 01/26/16 0416  TROPONINI 0.04* 0.04*   BNP (last 3 results) No results for input(s): PROBNP in the last 8760 hours. HbA1C: No results for input(s): HGBA1C in the last 72 hours. CBG:  Recent Labs Lab 01/22/16 0901  GLUCAP 122*   Lipid Profile: No results for input(s): CHOL, HDL, LDLCALC, TRIG, CHOLHDL, LDLDIRECT in the last 72 hours. Thyroid Function Tests: No results for input(s): TSH, T4TOTAL, FREET4, T3FREE, THYROIDAB in the last 72 hours. Anemia Panel: No results for input(s): VITAMINB12, FOLATE, FERRITIN, TIBC, IRON, RETICCTPCT in the last 72 hours. Urine analysis:      Component Value Date/Time   COLORURINE YELLOW 01/20/2016 0027   APPEARANCEUR CLOUDY* 01/20/2016 0027   LABSPEC 1.017 01/20/2016 0027   PHURINE 7.0 01/20/2016 0027   GLUCOSEU NEGATIVE 01/20/2016 0027   HGBUR NEGATIVE 01/20/2016 0027   BILIRUBINUR NEGATIVE 01/20/2016 0027   KETONESUR NEGATIVE 01/20/2016 0027   PROTEINUR NEGATIVE 01/20/2016 0027   UROBILINOGEN 1.0 11/11/2014 0900   NITRITE NEGATIVE 01/20/2016 0027   LEUKOCYTESUR NEGATIVE 01/20/2016 0027   Sepsis Labs: '@LABRCNTIP'$ (procalcitonin:4,lacticidven:4)   Blood Culture (routine x 2)     Status: None   Collection Time: 01/20/16 12:01 AM  Result Value Ref Range Status   Specimen Description BLOOD RIGHT HAND  Final   Culture NO GROWTH 5 DAYS  Final   Report Status 01/25/2016 FINAL  Final  Blood Culture (routine x 2)     Status: None   Collection Time: 01/20/16 12:20 AM  Result Value Ref Range Status   Specimen Description BLOOD RIGHT HAND  Final   Culture NO GROWTH 5 DAYS  Final   Report Status 01/25/2016 FINAL  Final  Urine culture     Status: Abnormal   Collection Time: 01/20/16 12:27 AM  Result Value Ref Range Status   Specimen Description URINE, RANDOM  Final   Special Requests NONE  Final   Culture MULTIPLE SPECIES PRESENT, SUGGEST RECOLLECTION (A)  Final   Report Status 01/21/2016 FINAL  Final  MRSA PCR Screening     Status: None   Collection Time: 01/20/16  8:04 PM  Result Value Ref Range Status   MRSA by PCR NEGATIVE NEGATIVE Final  Respiratory Panel by PCR     Status: None   Collection Time: 01/22/16 10:07 AM  Result Value Ref Range Status   Adenovirus NOT DETECTED NOT DETECTED Final   Coronavirus 229E NOT DETECTED NOT DETECTED Final   Coronavirus HKU1 NOT DETECTED NOT DETECTED Final   Coronavirus NL63 NOT DETECTED NOT DETECTED Final   Coronavirus OC43 NOT DETECTED NOT DETECTED Final   Metapneumovirus NOT DETECTED NOT DETECTED Final   Rhinovirus / Enterovirus NOT DETECTED NOT DETECTED Final   Influenza A  NOT DETECTED NOT DETECTED Final   Influenza A H1 NOT DETECTED NOT DETECTED Final   Influenza A H1 2009 NOT DETECTED NOT DETECTED Final   Influenza A H3 NOT DETECTED NOT DETECTED Final   Influenza B NOT DETECTED NOT DETECTED Final   Parainfluenza Virus 1 NOT DETECTED NOT DETECTED Final   Parainfluenza Virus 2 NOT DETECTED NOT DETECTED Final   Parainfluenza Virus 3 NOT DETECTED NOT DETECTED Final   Parainfluenza Virus 4 NOT DETECTED NOT DETECTED Final   Respiratory Syncytial Virus NOT DETECTED NOT DETECTED Final   Bordetella pertussis NOT DETECTED NOT DETECTED Final   Chlamydophila pneumoniae NOT DETECTED NOT DETECTED Final   Mycoplasma pneumoniae NOT DETECTED NOT DETECTED Final      Radiology Studies:  Ct Head Wo Contrast 01/20/2016  Postoperative changes in the right posterior parietal region with right posterior parietal intraparenchymal and subdural hematomas demonstrating typical evolution ule changes since previous study. No evidence of progression. No developing mass effect. Electronically Signed   By: Lucienne Capers M.D.   On: 01/20/2016 01:12   Ct Chest Wo Contrast 01/20/2016  Clusters of nodularity in the right middle lobe and lingula, new from prior study and most likely representing pneumonia. Metastatic disease is much less likely given rapid interval development of this nodules. Clinical correlation and follow-up recommended. Stable postsurgical changes of right upper lobectomy and stable small bilateral pleural effusions. Electronically Signed   By: Anner Crete M.D.   On: 01/20/2016 01:30   Dg Chest Port 1 View 01/23/2016  COPD. Small right pleural effusion. No alveolar pneumonia nor CHF. Chronic right apical density. Electronically Signed   By: David  Martinique M.D.   On: 01/23/2016 07:30   Dg Chest Port 1 View 01/22/2016  No pulmonary edema. Small bilateral pleural effusion. Hazy left basilar atelectasis or early infiltrate. Electronically Signed   By: Lahoma Crocker M.D.   On:  01/22/2016 08:36    Scheduled Meds: . amLODipine  5 mg Oral Daily  . aspirin EC  81 mg Oral Daily  . budesonide  0.25 mg Nebulization BID  . clonazePAM  0.5 mg Oral TID  . cloNIDine  0.1 mg Oral TID  . cyanocobalamin  500 mcg Oral Daily  . dexamethasone  4 mg Oral QID  . dextromethorphan-guai  1 tablet Oral BID  . docusate sodium  100 mg Oral BID  . enoxaparin (LOVENOX)   40 mg Subcutaneous Q24H  . famotidine  40 mg Oral BID  . feeding supplement   237 mL Oral BID BM  . furosemide  40 mg Oral BID  . ipratropium-albuterol  3 mL Nebulization QID  . isosorbide mononitrate  30 mg Oral Daily  . levETIRAcetam  1,500 mg Oral BID  . metoprolol  50 mg Oral BID  . pramipexole  1 mg Oral QHS  . senna  2 tablet Oral Daily  . tamsulosin  0.4 mg Oral Daily   Continuous Infusions:    LOS: 7 days    Time spent: 25 minutes  Greater than 50% of the time spent on counseling and coordinating the care.   Leisa Lenz, MD [facility] Pager [phone]  If 7PM-7AM, please contact night-coverage [email or web address] Password [email or web address] 01/27/2016, 9:46 AM

## 2016-01-28 MED ORDER — IPRATROPIUM-ALBUTEROL 0.5-2.5 (3) MG/3ML IN SOLN: 3.0000 mL | RESPIRATORY_TRACT | Status: AC | PRN

## 2016-01-28 MED ORDER — HEPARIN SOD (PORK) LOCK FLUSH 100 UNIT/ML IV SOLN
500.0000 [IU] | INTRAVENOUS | Status: AC | PRN
  Administered 2016-01-28: 500 [IU]

## 2016-01-28 MED ORDER — ZOLPIDEM TARTRATE 10 MG PO TABS: 10.0000 mg | ORAL_TABLET | Freq: Every evening | ORAL | Status: AC | PRN

## 2016-01-28 MED ORDER — HYDROCODONE-ACETAMINOPHEN 7.5-325 MG PO TABS: 1.0000 | ORAL_TABLET | Freq: Four times a day (QID) | ORAL | Status: AC | PRN

## 2016-01-28 MED ORDER — CLONAZEPAM 0.5 MG PO TABS
0.5000 mg | ORAL_TABLET | Freq: Two times a day (BID) | ORAL | Status: AC | PRN
Start: 2016-01-28 — End: ?

## 2016-01-28 NOTE — Discharge Summary (Addendum)
Physician Discharge Summary  OPIE MACLAUGHLIN NIO:[id number] DOB: 02/28/1941 DOA: 01/19/2016  PCP: Beatris Si  Admit date: 01/19/2016 Discharge date: 01/28/2016  Recommendations for Outpatient Follow-up:  Skin tear to the left forearm.  Previously no topical care ordered per neurosurgery to the craniotomy site. Continue to have palliative care services in skilled nursing facility to continue addressing goals of care.  Discharge Diagnoses:  Principal Problem:   HCAP (healthcare-associated pneumonia) Active Problems:   Essential hypertension, benign   Brain metastasis ([place])   Sepsis ([place])   Hypokalemia   Metastatic lung cancer (metastasis from lung to other site) ([place])   COPD exacerbation (H[facility])   Acute on chronic respiratory failure (HCC)   Thrombocytopenia (HCC)   Hyponatremia   BPH (benign prostatic hyperplasia)   Anemia    Discharge Condition: stable   Diet recommendation: as tolerated   History of present illness:  75 y.o. Male with past medical history anxiety and depression, hypertension, seizures, COPD on 2 L [place] oxygen, chronic diastolic CHF, NSCLC with metastases( s/p RU lobectomy, XRT, chemo), brain metastases, s/p craniotomy on 01/16/2016 who presented to [facility] with shortness of breath and cough for past 2 days prior to this admission.   Pt had temp on admission 90.9, low blood pressure 86/63 which improved to 119/65 with 1 L normal saline bolus. Pt was also bradycardic, tachypneic. Blood work was notable for WBC 6.2, potassium 3.4, creatinine was normal. CT chest showed clusters of nodularity in the right middle lobe and lingula, new from prior study and most likely representing pneumonia. CT head showed postoperative changes in the right posterior parietal region with right posterior parietal intraparenchymal and subdural hematomas demonstrating typical evolutional changes since previous study. No evidence of progression. No developing mass effect.  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  - Continue Decadron 4 mg by mouth 4 times a day  Pulmonary edema / Chronic diastolic CHF - As noted above, stable respiratory status - Patient had 2-D echo in June 2017 with ejection fraction of 55%. He is 2-D echo in 2016 showed grade 1 diastolic dysfunction - CHF compensated  - Continue lasix, metoprolol   HCAP, unspecified organism - Completed antibiotics, please refer to anti-infectives section below  - Sepsis ruled out  Accelerated hypertension - Blood pressure currently controlled with Norvasc 5 mg daily, clonidine 0.1 mg 3 times daily, Lasix 40 mg twice daily, isosorbide mononitrate 30 mg daily and metoprolol 50 mg twice daily  Anemia of critical illness and chronic disease - Hemoglobin stable   Seizure disorder  - Continue Keppra 1500 mg twice daily   Anxiety and depression - Continue Klonopin 3 times a day   DVT prophylaxis: Lovenox suBQ Code Status: full code  Family Communication: no family at the bedside this am    Consultants:   PCCM  Neurosurgery  Oncology  Procedures:   None  Antimicrobials:   Cefepime 7/10 >  7/13  Vanc 7/10 > 7/14   Signed:  Leisa Lenz, MD  [facility] 01/28/2016, 9:37 AM  Pager #: [phone]  Time spent in  minutes: more than 30 minutes   Discharge Exam: Filed Vitals:   01/28/16 0544 01/28/16 0929  BP: 154/93 170/74  Pulse: 97 102  Temp: 97.4 F (36.3 C)   Resp: 18    Filed Vitals:   01/27/16 2154 01/28/16 0544 01/28/16 0827 01/28/16 0929  BP: 115/78 154/93  170/74  Pulse: 115 97  102  Temp: 98.3 F (36.8 C) 97.4 F (36.3 C)    TempSrc:  Oral    Resp: 22 18    Height:      Weight:      SpO2: 92% 95% 95%     General: Pt is not in acute distress Cardiovascular: Regular rate and rhythm, S1/S2 + Respiratory: no wheezing, no crackles, no rhonchi Abdominal: Soft, non tender, non distended, bowel sounds +, no guarding Extremities: no cyanosis, pulses palpable bilaterally DP and PT Neuro: Grossly nonfocal  Discharge Instructions  Discharge Instructions    Call MD for:  difficulty breathing, headache or visual disturbances    Complete by:  As directed      Call MD for:  hives    Complete by:  As directed      Call MD for:  persistant nausea and vomiting    Complete by:  As directed      Call MD for:  severe uncontrolled pain    Complete by:  As directed      Diet - low sodium heart healthy    Complete by:  As directed      Discharge wound care:    Complete by:  As directed   Skin tear to the left forearm.  Previously no topical care ordered per neurosurgery to the craniotomy site.     Increase activity slowly    Complete by:  As directed             Medication List    TAKE these medications        amLODipine 5 MG tablet  Commonly known as:  NORVASC  Take 5 mg by mouth daily.     aspirin EC 81 MG tablet  Take 81 mg by mouth daily. Reported on 12/22/2015     budesonide 0.25 MG/2ML nebulizer solution  Commonly known as:  PULMICORT  Take 2 mLs (0.25 mg total) by nebulization 2 (two) times daily.     CATAPRES 0.1 MG tablet  Generic drug:  cloNIDine  Take 0.1 mg by mouth 2 (two) times daily.     clonazePAM 0.5 MG tablet  Commonly known as:  KLONOPIN  Take 1  tablet (0.5 mg total) by mouth 2 (two) times daily as needed for anxiety. Takes one tablet by mouth two times daily as needed for anxiety.     cyanocobalamin 500 MCG tablet  Take 1 tablet (500 mcg total) by mouth daily.     dexamethasone 4 MG tablet  Commonly known as:  DECADRON  Take 1 tablet (4 mg total) by mouth 4 (four) times daily.     docusate sodium 100 MG capsule  Commonly known as:  COLACE  Take 1 capsule (100 mg total) by mouth 2 (two) times daily.     famotidine 40 MG tablet  Commonly known as:  PEPCID  Take 1 tablet (40 mg total) by mouth 2 (two) times daily.     feeding supplement (ENSURE  ENLIVE) Liqd  Take 237 mLs by mouth 2 (two) times daily between meals.     furosemide 40 MG tablet  Commonly known as:  LASIX  Take 1 tablet (40 mg total) by mouth 2 (two) times daily.     guaifenesin 400 MG Tabs tablet  Commonly known as:  HUMIBID E  Take 400 mg by mouth 2 (two) times daily.     HYDROcodone-acetaminophen 7.5-325 MG tablet  Commonly known as:  NORCO  Take 1 tablet by mouth every 6 (six) hours as needed for moderate pain.     ipratropium-albuterol 0.5-2.5 (3) MG/3ML Soln  Commonly known as:  DUONEB  Take 3 mLs by nebulization 4 (four) times daily.     ipratropium-albuterol 0.5-2.5 (3) MG/3ML Soln  Commonly known as:  DUONEB  Take 3 mLs by nebulization every 2 (two) hours as needed.     isosorbide mononitrate 30 MG 24 hr tablet  Commonly known as:  IMDUR  Take 1 tablet (30 mg total) by mouth daily.     levETIRAcetam 750 MG tablet  Commonly known as:  KEPPRA  Take 2 tablets (1,500 mg total) by mouth 2 (two) times daily.     metoprolol 50 MG tablet  Commonly known as:  LOPRESSOR  Take 1 tablet (50 mg total) by mouth 2 (two) times daily.     OXYGEN  Inhale 2-3 L into the lungs continuous.     pramipexole 1 MG tablet  Commonly known as:  MIRAPEX  Take 1 mg by mouth at bedtime.     senna 8.6 MG Tabs tablet  Commonly known as:  SENOKOT  Take 2  tablets (17.2 mg total) by mouth daily.     sodium chloride 0.65 % Soln nasal spray  Commonly known as:  OCEAN  Place 1 spray into both nostrils as needed for congestion.     sodium chloride 1 g tablet  Take 2 tablets (2 g total) by mouth 3 (three) times daily with meals.     tamsulosin 0.4 MG Caps capsule  Commonly known as:  FLOMAX  Take 1 capsule (0.4 mg total) by mouth daily.     triamcinolone cream 0.1 %  Commonly known as:  KENALOG  Apply 1 application topically 2 (two) times daily as needed (itching/ dry skin).     zolpidem 10 MG tablet  Commonly known as:  AMBIEN  Take 1 tablet (10 mg total) by mouth at bedtime as needed for sleep. Reported on 09/25/2015           Follow-up Information    Follow up with HEPLER,MARK, PA-C. Schedule an appointment as soon as possible for a visit in 2 weeks.   Specialty:  Physician Assistant   Why:  Follow up appt after recent hospitalization   Contact information:   [address] [place] [id number] [phone]        The results of significant diagnostics from this hospitalization (including imaging, microbiology, ancillary and laboratory) are listed below for reference.    Significant Diagnostic Studies: Dg Chest 2 View  01/17/2016  CLINICAL DATA:  Short of breath and cough EXAM: CHEST  2 VIEW COMPARISON:  01/15/2015 FINDINGS: Stable right jugular Port-A-Cath. Chronic pleural changes at the right lung base. Left lung is hyperaerated and clear. Postop changes in the right hilum. No pneumothorax. Right apical pleural thickening is stable. IMPRESSION: No active cardiopulmonary disease. Electronically Signed   By: Marybelle Killings M.D.   On: 01/17/2016 13:16  Dg Abd 1 View  01/14/2016  CLINICAL DATA:  Constipation, ileus EXAM: ABDOMEN - 1 VIEW COMPARISON:  None FINDINGS: Increased stool and small gas throughout colon including rectum. Air-filled upper normal caliber small bowel loops throughout abdomen. Nonobstructive bowel gas  pattern top bowel wall thickening. Gas within stomach. Bones demineralized with degenerative disc disease changes lumbar spine. No urine tract calcification. IMPRESSION: Nonobstructive bowel gas pattern with air-filled small bowel loops and increased stool and small amount of gas throughout colon. Electronically Signed   By: Lavonia Dana M.D.   On: 01/14/2016 20:26   Ct Head Wo Contrast  01/20/2016  CLINICAL DATA:  Recent brain surgery. Pain near the surgery site. History of metastasis to the right parietal and occipital region from lung cancer. EXAM: CT HEAD WITHOUT CONTRAST TECHNIQUE: Contiguous axial images were obtained from the base of the skull through the vertex without intravenous contrast. COMPARISON:  01/14/2016 FINDINGS: Postoperative changes with right posterior parietal craniotomy. Plate and screw fixation of the bone flap. Skin clips consistent with recent surgery. Small subcutaneous scalp hematoma over the surgical region. There is an intraparenchymal hematoma in the right posterior parietal region measuring about 3.1 x 5 cm diameter. There is surrounding vasogenic edema. The hematoma is decreasing in density consistent with typical evolution since previous study. Small subdural hematoma is demonstrated under the bone flap. These all likely represent postoperative changes. There is no significant progression since previous study. No new hemorrhagic foci. No midline shift. There is residual sulcal effacement. Underlying diffuse cerebral atrophy. Ventricular dilatation consistent with central atrophy. Basal cisterns are not effaced. Mucosal thickening in the paranasal sinuses. Partial opacification of left mastoid air cells. Vascular calcifications. IMPRESSION: Postoperative changes in the right posterior parietal region with right posterior parietal intraparenchymal and subdural hematomas demonstrating typical evolution ule changes since previous study. No evidence of progression. No developing mass  effect. Electronically Signed   By: Lucienne Capers M.D.   On: 01/20/2016 01:12   Ct Head Wo Contrast  01/14/2016  CLINICAL DATA:  Metastatic lung cancer. Postop craniotomy. Worsening left-sided deficit. Intracranial hemorrhage. EXAM: CT HEAD WITHOUT CONTRAST TECHNIQUE: Contiguous axial images were obtained from the base of the skull through the vertex without intravenous contrast. COMPARISON:  CT head 01/10/2016 FINDINGS: Right parietal craniotomy for tumor resection. High-density hemorrhage in the high right parietal lobe has increased in size. This is best seen on sagittal and coronal images. Extensive vasogenic edema in the right parietal white matter also has progressed. Small right parietal subdural hematoma unchanged at the craniotomy site. No shift of the midline structures.  No other areas of hemorrhage Negative for acute ischemia. IMPRESSION: Postop hematoma right parietal lobe has increased since the prior CT. Large amount of surrounding edema also has progressed. No shift of the midline structures. Small right parietal subdural hematoma unchanged Electronically Signed   By: Franchot Gallo M.D.   On: 01/14/2016 21:57   Ct Head Wo Contrast  01/10/2016  CLINICAL DATA:  75 year old male with metastatic lung cancer post craniotomy. Subsequent encounter. EXAM: CT HEAD WITHOUT CONTRAST TECHNIQUE: Contiguous axial images were obtained from the base of the skull through the vertex without intravenous contrast. COMPARISON:  01/09/2016 head CT.  12/13/2015 brain MR. FINDINGS: Post right parietal craniotomy. Gas and blood containing epidural collection below the craniotomy site with maximal thickness of 5.6 mm without significant change. Large right parietal lobe hematoma appears minimally more prominent than on yesterday's examination now measuring 3.7 x 4.3 x 4.2 cm versus prior 3.2  x 3.7 x 4 cm. Surrounding vasogenic edema and mass effect upon the right lateral ventricle which is displaced inferiorly. MR  detected intracranial metastatic lesions are not as well delineated on the present unenhanced CT. IMPRESSION: Post right parietal craniotomy. Gas and blood containing epidural collection below the craniotomy site with maximal thickness of 5.6 mm without significant change. Large right parietal lobe hematoma appears minimally more prominent than on yesterday's examination now measuring 3.7 x 4.3 x 4.2 cm versus prior 3.2 x 3.7 x 4 cm. Surrounding vasogenic edema and mass effect upon the right lateral ventricle which is displaced inferiorly. Electronically Signed   By: Genia Del M.D.   On: 01/10/2016 11:06   Ct Head Wo Contrast  01/09/2016  CLINICAL DATA:  Sudden onset of acute confusion and severe headache. Recent craniotomy for metastatic lung cancer. EXAM: CT HEAD WITHOUT CONTRAST TECHNIQUE: Contiguous axial images were obtained from the base of the skull through the vertex without intravenous contrast. COMPARISON:  Brain MRI 12/25/2015 FINDINGS: Sequelae of interval right parietal craniotomy are identified. There is a 4.5 x 2.8 x 4.0 cm hematoma in the right parietal lobe deep to the resection site. There is mild surrounding vasogenic edema with regional sulcal effacement but no midline shift. There is a small extra-axial collection/hematoma subjacent to the craniotomy which measures up to 7 mm in thickness. Elsewhere, there is no evidence of acute infarct or mass. The small metastases in the left frontal lobe and right cerebellum on MRI are not clearly seen on this noncontrast CT. There is mild global cerebral atrophy. Periventricular white matter hypodensities are nonspecific but compatible with mild chronic small vessel ischemic disease. Prior right cataract extraction is noted. Skin staples and swelling are noted in the right parietal scalp. Small amount of secretions are present in the right sphenoid sinus. There is a trace left mastoid effusion. Calcified atherosclerosis is noted at the skullbase.  IMPRESSION: Interval postoperative changes with new 4.5 cm right parietal hematoma. Mild surrounding edema without midline shift. Critical Value/emergent results were called by telephone at the time of interpretation on 01/09/2016 at 10:22 am to Dr. Kary Kos , who verbally acknowledged these results. Electronically Signed   By: Logan Bores M.D.   On: 01/09/2016 10:27   Ct Chest Wo Contrast  01/20/2016  CLINICAL DATA:  75 year old male with fever and shortness of breath. History of metastatic lung cancer status post prior VATS procedure. EXAM: CT CHEST WITHOUT CONTRAST TECHNIQUE: Multidetector CT imaging of the chest was performed following the standard protocol without IV contrast. COMPARISON:  Chest radiograph dated 01/17/2016 and chest CT dated 12/29/2015 FINDINGS: Evaluation of this exam is limited in the absence of intravenous contrast. There is a small right pleural effusion which appears similar in size to the prior study. Trace left pleural effusions also noted, stable. There is mild centrilobular emphysema. Postsurgical changes of right upper lobectomy with areas of scarring noted. There is a cluster of nodular density in the right middle lobe. A cluster of nodularity is also noted in the lingula, new from prior study most compatible with developing pneumonia. Metastatic disease is less likely given these nodules were not present on the recent study of 12/29/2015. Clinical correlation and follow-up recommended. There is no focal consolidation. No pneumothorax. The the central airways are patent. There is atherosclerotic calcification of the thoracic aorta. The central pulmonary arteries are grossly unremarkable on this noncontrast study. There is no cardiomegaly or pericardial effusion. There is coronary vascular calcification. There is hypoattenuation  of the cardiac blood pool suggestive of a degree of anemia. Clinical correlation is recommended. There is no mediastinal adenopathy. No significant hilar  adenopathy noted. The esophagus is grossly unremarkable. No thyroid nodules identified. There is no axillary adenopathy. No supraclavicular adenopathy. The chest wall soft tissue appears unremarkable. Right pectoral Port-A-Cath with tip at the cavoatrial junction. There is osteopenia with degenerative changes of the spine. No acute fracture. Old right posterior rib fractures noted. The visualized upper abdomen appears unremarkable. IMPRESSION: Clusters of nodularity in the right middle lobe and lingula, new from prior study and most likely representing pneumonia. Metastatic disease is much less likely given rapid interval development of this nodules. Clinical correlation and follow-up recommended. Stable postsurgical changes of right upper lobectomy and stable small bilateral pleural effusions. Electronically Signed   By: Anner Crete M.D.   On: 01/20/2016 01:30   Dg Chest Port 1 View  01/23/2016  CLINICAL DATA:  Acute respiratory failure EXAM: PORTABLE CHEST 1 VIEW COMPARISON:  Portable chest x-ray of January 22, 2016 FINDINGS: The lungs are well-expanded. There is persistent increased density at the right lung base. No alveolar infiltrates are observed stable right apical density. The heart and pulmonary vascularity are normal. There is aortic arch calcification. The power port appliance tip projects over the midportion of the SVC. Old posterior right seventh rib fracture IMPRESSION: COPD. Small right pleural effusion. No alveolar pneumonia nor CHF. Chronic right apical density. Electronically Signed   By: David  Martinique M.D.   On: 01/23/2016 07:30   Dg Chest Port 1 View  01/22/2016  CLINICAL DATA:  Shortness of Breath EXAM: PORTABLE CHEST 1 VIEW COMPARISON:  01/20/2016 FINDINGS: Cardiomediastinal silhouette is stable. Right IJ Port-A-Cath with tip in SVC. No pulmonary edema. Small bilateral pleural effusion. Hazy left basilar atelectasis or infiltrate. IMPRESSION: No pulmonary edema. Small bilateral pleural  effusion. Hazy left basilar atelectasis or early infiltrate. Electronically Signed   By: Lahoma Crocker M.D.   On: 01/22/2016 08:36   Dg Chest Port 1 View  01/15/2016  CLINICAL DATA:  Chronic respiratory failure, shortness of breath and cough, history COPD, hypertension, GERD, metastatic lung cancer EXAM: PORTABLE CHEST 1 VIEW COMPARISON:  Portable exam 1659 hours compared to 12/30/2015 FINDINGS: RIGHT jugular Port-A-Cath with tip projecting over SVC. Normal heart size, mediastinal contours and pulmonary vascularity. Atherosclerotic calcification aorta. Volume loss in RIGHT hemithorax with pleuroparenchymal scarring at RIGHT apex and RIGHT base, stable appearance since prior study. Surgical clips on minimal prominence at RIGHT hilum unchanged. Underlying emphysematous changes. No definite acute infiltrate, pleural effusion or pneumothorax. Bones demineralized. IMPRESSION: COPD changes with postsurgical changes of the RIGHT hemithorax. No acute abnormalities. Aortic atherosclerosis. Electronically Signed   By: Lavonia Dana M.D.   On: 01/15/2016 17:08   Dg Chest Portable 1 View  12/30/2015  CLINICAL DATA:  75 year old male with shortness of breath and wheezing EXAM: PORTABLE CHEST 1 VIEW COMPARISON:  CT dated 12/29/2015 FINDINGS: Single portable view of the chest demonstrates emphysematous changes of the lungs. There is a small right pleural effusion as seen on the prior CT. There is associated partial compressive atelectasis of the right lung base. Underlying infiltrate is not excluded. No focal consolidation or pneumothorax. Right pectoral Port-A-Cath with tip over central SVC. 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 The cardiac silhouette is within normal limits with no acute osseous pathology. IMPRESSION: Small right pleural effusion. No focal consolidation or pneumothorax. Electronically Signed  By: Anner Crete M.D.   On: 12/30/2015 02:12     Microbiology: Recent Results (from the past 240 hour(s))  Blood Culture (routine x 2)     Status: None   Collection Time: 01/20/16 12:01 AM  Result Value Ref Range Status   Specimen Description BLOOD RIGHT HAND  Final   Special Requests BOTTLES DRAWN AEROBIC AND ANAEROBIC 5ML  Final   Culture NO GROWTH 5 DAYS  Final   Report Status 01/25/2016 FINAL  Final  Blood Culture (routine x 2)     Status: None   Collection Time: 01/20/16 12:20 AM  Result Value Ref Range Status   Specimen Description BLOOD RIGHT HAND  Final   Special Requests BOTTLES DRAWN AEROBIC AND ANAEROBIC 5ML  Final   Culture NO GROWTH 5 DAYS  Final   Report Status 01/25/2016 FINAL  Final  Urine culture     Status: Abnormal   Collection Time: 01/20/16 12:27 AM  Result Value Ref Range Status   Specimen Description URINE, RANDOM  Final   Special Requests NONE  Final   Culture MULTIPLE SPECIES PRESENT, SUGGEST RECOLLECTION (A)  Final   Report Status 01/21/2016 FINAL  Final  MRSA PCR Screening     Status: None   Collection Time: 01/20/16  8:04 PM  Result Value Ref Range Status   MRSA by PCR NEGATIVE NEGATIVE Final    Comment:        The GeneXpert MRSA Assay (FDA approved for NASAL specimens only), is one component of a comprehensive MRSA colonization surveillance program. It is not intended to diagnose MRSA infection nor to guide or monitor treatment for MRSA infections.   Respiratory Panel by PCR     Status: None   Collection Time: 01/22/16 10:07 AM  Result Value Ref Range Status   Adenovirus NOT DETECTED NOT DETECTED Final   Coronavirus 229E NOT DETECTED NOT DETECTED Final   Coronavirus HKU1 NOT DETECTED NOT DETECTED Final   Coronavirus NL63 NOT DETECTED NOT DETECTED Final   Coronavirus OC43 NOT DETECTED NOT DETECTED Final   Metapneumovirus NOT DETECTED NOT DETECTED Final   Rhinovirus / Enterovirus NOT DETECTED NOT DETECTED Final   Influenza A NOT DETECTED NOT DETECTED Final   Influenza A H1 NOT  DETECTED NOT DETECTED Final   Influenza A H1 2009 NOT DETECTED NOT DETECTED Final   Influenza A H3 NOT DETECTED NOT DETECTED Final   Influenza B NOT DETECTED NOT DETECTED Final   Parainfluenza Virus 1 NOT DETECTED NOT DETECTED Final   Parainfluenza Virus 2 NOT DETECTED NOT DETECTED Final   Parainfluenza Virus 3 NOT DETECTED NOT DETECTED Final   Parainfluenza Virus 4 NOT DETECTED NOT DETECTED Final   Respiratory Syncytial Virus NOT DETECTED NOT DETECTED Final   Bordetella pertussis NOT DETECTED NOT DETECTED Final   Chlamydophila pneumoniae NOT DETECTED NOT DETECTED Final   Mycoplasma pneumoniae NOT DETECTED NOT DETECTED Final     Labs: Basic Metabolic Panel:  Recent Labs Lab 01/23/16 0455 01/24/16 0520 01/25/16 2212 01/27/16 0430  NA 131* 134* 133* 134*  K 3.9 4.1 3.7 4.3  CL 96* 95* 91* 94*  CO2 30 33* 36* 35*  GLUCOSE 106* 115* 118* 126*  BUN '14 16 18 '$ 22*  CREATININE 0.58* 0.47* 0.53* 0.57*  CALCIUM 7.8* 8.2* 7.9* 8.2*  MG  --   --  2.0  --    Liver Function Tests: No results for input(s): AST, ALT, ALKPHOS, BILITOT, PROT, ALBUMIN in the last 168 hours. No results  for input(s): LIPASE, AMYLASE in the last 168 hours. No results for input(s): AMMONIA in the last 168 hours. CBC:  Recent Labs Lab 01/23/16 0455 01/24/16 0520 01/25/16 2212 01/27/16 0430  WBC 5.0 4.3 4.3 3.2*  HGB 9.3* 8.9* 9.0* 8.0*  HCT 27.9* 27.6* 28.1* 25.1*  MCV 93.0 94.2 94.0 95.4  PLT 105* 110* 111* 98*   Cardiac Enzymes:  Recent Labs Lab 01/25/16 2212 01/26/16 0416  TROPONINI 0.04* 0.04*   BNP: BNP (last 3 results)  Recent Labs  12/30/15 0131 01/17/16 1211 01/20/16 0342  BNP 265.3* 150.3* 134.3*    ProBNP (last 3 results) No results for input(s): PROBNP in the last 8760 hours.  CBG:  Recent Labs Lab 01/22/16 0901  GLUCAP 122*

## 2016-01-28 NOTE — Progress Notes (Signed)
Patient will DC to: [place] Anticipated DC date: 01/28/16 Family notified: Spouse Transport by: Corey Harold   Per MD patient ready for DC to [facility]. RN, patient, patient's family, and facility notified of DC. RN given number for report. DC packet on chart. Ambulance transport requested for patient.   CSW signing off.  Cedric Fishman, [place] Social Worker [phone]

## 2016-01-28 NOTE — Clinical Social Work Placement (Signed)
   CLINICAL SOCIAL WORK PLACEMENT  NOTE  Date:  01/28/2016  Patient Details  Name: [name] MRN: [id number] Date of Birth: Jun 14, 1941  Clinical Social Work is seeking post-discharge placement for this patient at the [place] level of care (*CSW will initial, date and re-position this form in  chart as items are completed):  Yes   Patient/family provided with [place] Work Department's list of facilities offering this level of care within the geographic area requested by the patient (or if unable, by the patient's family).  Yes   Patient/family informed of their freedom to choose among providers that offer the needed level of care, that participate in Medicare, Medicaid or managed care program needed by the patient, have an available bed and are willing to accept the patient.  Yes   Patient/family informed of [facility]'s ownership interest in [facility] and [facility], as well as of the fact that they are under no obligation to receive care at these facilities.  PASRR submitted to EDS on       PASRR number received on       Existing PASRR number confirmed on       FL2 transmitted to all facilities in geographic area requested by pt/family on       FL2 transmitted to all facilities within larger geographic area on       Patient informed that his/her managed care company has contracts with or will negotiate with certain facilities, including the following:        Yes   Patient/family informed of bed offers received.  Patient chooses bed at [facility]     Physician recommends and patient chooses bed at      Patient to be transferred to [facility] on 01/28/16.  Patient to be transferred to facility by [facility]     Patient family notified on 01/28/16 of transfer.  Name of family member notified:  [facility]     PHYSICIAN       Additional Comment:    _______________________________________________ Benard Halsted,  [employer] 01/28/2016, 12:55 PM

## 2016-01-28 NOTE — Discharge Instructions (Signed)
Levetiracetam extended-release tablets What is this medicine? LEVETIRACETAM (lee ve tye RA se tam) is an antiepileptic drug. It is used with other medicines to treat certain types of seizures. This medicine may be used for other purposes; ask your health care provider or pharmacist if you have questions. What should I tell my health care provider before I take this medicine? They need to know if you have any of these conditions: -kidney disease -suicidal thoughts, plans, or attempt; a previous suicide attempt by you or a family member -an unusual or allergic reaction to levetiracetam, other medicines, foods, dyes, or preservatives -pregnant or trying to get pregnant -breast-feeding How should I use this medicine? Take this medicine by mouth with a glass of water. Follow the directions on the prescription label. Do not cut, crush or chew this medicine. You may take this medicine with or without food. Take your doses at regular intervals. Do not take your medicine more often than directed. Do not stop taking this medicine or any of your seizure medicines unless instructed by your doctor or health care professional. Stopping your medicine suddenly can increase your seizures or their severity. A special MedGuide will be given to you by the pharmacist with each prescription and refill. Be sure to read this information carefully each time. Contact your pediatrician or health care professional regarding the use of this medication in children. While this drug may be prescribed for children as young as 33 years of age for selected conditions, precautions do apply. Overdosage: If you think you have taken too much of this medicine contact a poison control center or emergency room at once. NOTE: This medicine is only for you. Do not share this medicine with others. What if I miss a dose? If you miss a dose and it has only been a few hours, take it as soon as you can. If it is almost time for your next dose,  take only that dose. Do not take double or extra doses. What may interact with this medicine? This medicine may interact with the following medications: -carbamazepine -colesevelam -probenecid -sevelamer This list may not describe all possible interactions. Give your health care provider a list of all the medicines, herbs, non-prescription drugs, or dietary supplements you use. Also tell them if you smoke, drink alcohol, or use illegal drugs. Some items may interact with your medicine. What should I watch for while using this medicine? Visit your doctor or health care professional for a regular check on your progress. Wear a medical identification bracelet or chain to say you have epilepsy, and carry a card that lists all your medications. It is important to take this medicine exactly as instructed by your health care professional. When first starting treatment, your dose may need to be adjusted. It may take weeks or months before your dose is stable. You should contact your doctor or health care professional if your seizures get worse or if you have any new types of seizures. You may get drowsy or dizzy. Do not drive, use machinery, or do anything that needs mental alertness until you know how this medicine affects you. Do not stand or sit up quickly, especially if you are an older patient. This reduces the risk of dizzy or fainting spells. Alcohol may interfere with the effect of this medicine. Avoid alcoholic drinks. The use of this medicine may increase the chance of suicidal thoughts or actions. Pay special attention to how you are responding while on this medicine. Any worsening of mood,  or thoughts of suicide or dying should be reported to your health care professional right away. The tablet shell for some brands of this medicine does not dissolve. This is normal. The tablet shell may appear in the stool. This is not cause for concern. Women who become pregnant while using this medicine may  enroll in the [place] Pregnancy Registry by calling [phone]. This registry collects information about the safety of antiepileptic drug use during pregnancy. What side effects may I notice from receiving this medicine? Side effects you should report to your doctor or health care professional as soon as possible: -allergic reactions like skin rash, itching or hives, swelling of the face, lips, or tongue -breathing problems -changes in emotions or moods -dark urine -general ill feeling or flu-like symptoms -problems with balance, talking, walking -suicidal thoughts or actions -unusually weak or tired -yellowing of the eyes or skin Side effects that usually do not require medical attention (report to your doctor or health care professional if they continue or are bothersome): -diarrhea -dizzy, drowsy -headache -loss of appetite This list may not describe all possible side effects. Call your doctor for medical advice about side effects. You may report side effects to FDA at 1-800-FDA-1088. Where should I keep my medicine? Keep out of reach of children. Store at room temperature between 15 and 30 degrees C (59 and 86 degrees F). Throw away any unused medicine after the expiration date. NOTE: This sheet is a summary. It may not cover all possible information. If you have questions about this medicine, talk to your doctor, pharmacist, or health care provider.    2016, Elsevier/Gold Standard. (2014-10-21 10:13:38)

## 2016-02-05 ENCOUNTER — Encounter: Payer: Self-pay | Admitting: *Deleted
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
## 2016-02-09 ENCOUNTER — Telehealth: Payer: Self-pay | Admitting: Pulmonary Disease

## 2016-02-09 NOTE — Telephone Encounter (Signed)
Will route message to SN to make him aware.

## 2016-02-10 ENCOUNTER — Encounter: Payer: Self-pay | Admitting: Radiation Therapy

## 2016-02-10 NOTE — Progress Notes (Signed)
Mal Amabile to let us know that [name] passed away over the weekend. She wants everyone to know how thankful she is for the care he received here.   [name] 22-Aug-1940 - 02-09-2016

## 2016-02-10 DEATH — deceased

## 2016-02-16 ENCOUNTER — Ambulatory Visit: Payer: Self-pay | Admitting: Radiation Oncology

## 2016-03-30 ENCOUNTER — Ambulatory Visit: Payer: Medicare Other | Admitting: Diagnostic Neuroimaging

## 2016-06-19 ENCOUNTER — Other Ambulatory Visit: Payer: Self-pay | Admitting: Nurse Practitioner

## 2017-05-17 IMAGING — CT CT HEAD W/O CM
4 series · 16 of 47 positions shown, 18 images · non-contrast
Comparison: 01/09/2016 head CT.  12/13/2015 brain MR.

CLINICAL DATA: 74-year-old male with metastatic lung cancer post
craniotomy. Subsequent encounter.

EXAM:
CT HEAD WITHOUT CONTRAST
TECHNIQUE: Contiguous axial images were obtained from the base of the skull
through the vertex without intravenous contrast.

[Series 2: head without · axial · non-contrast · 0.44mm/px · z∈[-494,-379]mm · 7 of 31 slices shown, 9 images]
[im 4/31  brain]
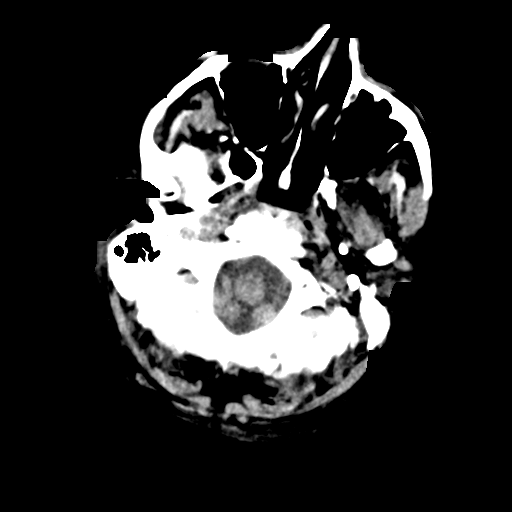
[im 4/31  bone]
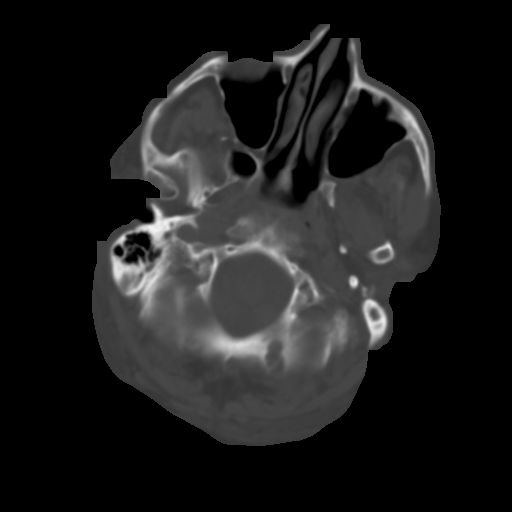
[im 8/31  brain]
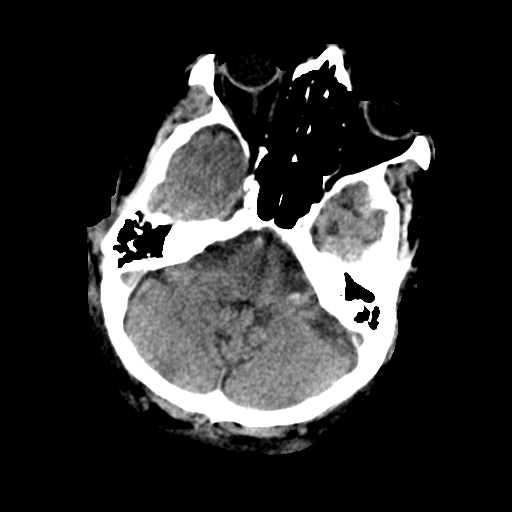
[im 12/31  brain]
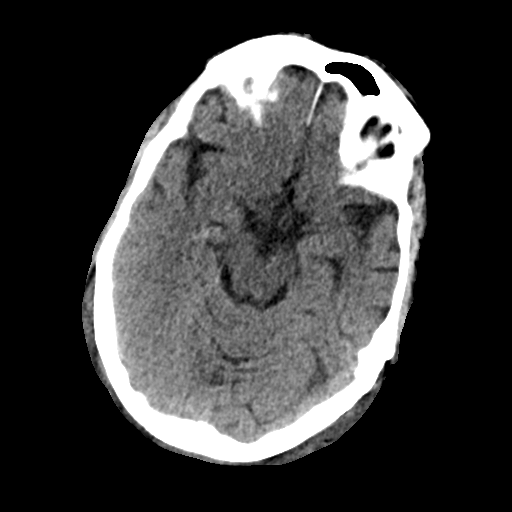
[im 16/31  brain]
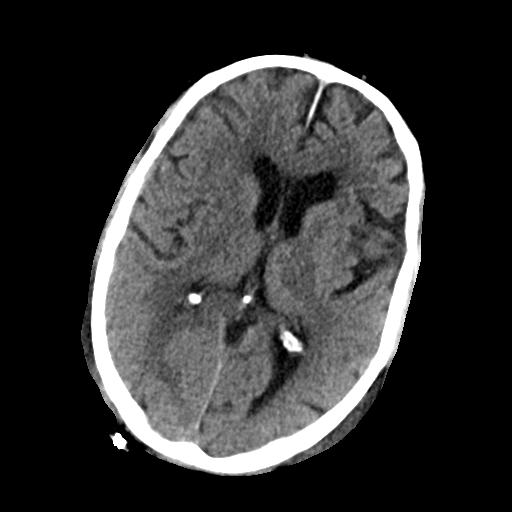
[im 19/31  brain]
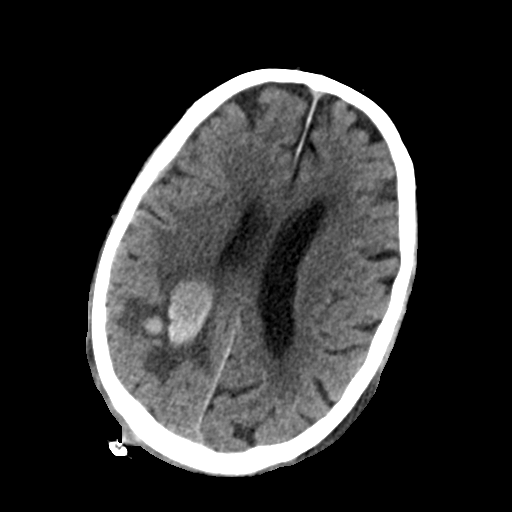
[im 19/31  bone]
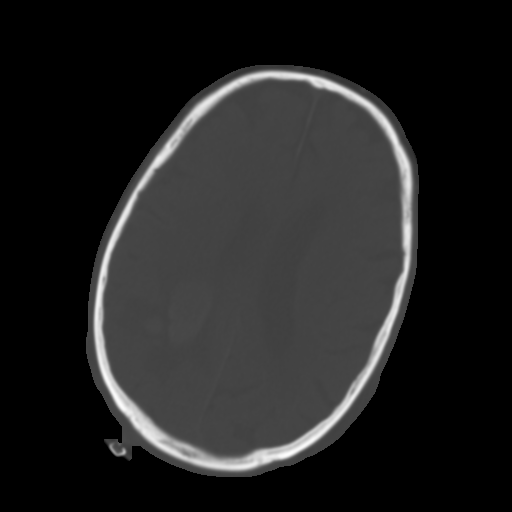
[im 23/31  brain]
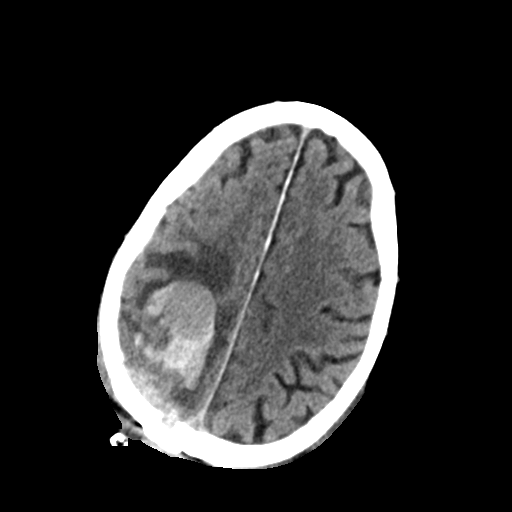
[im 27/31  brain]
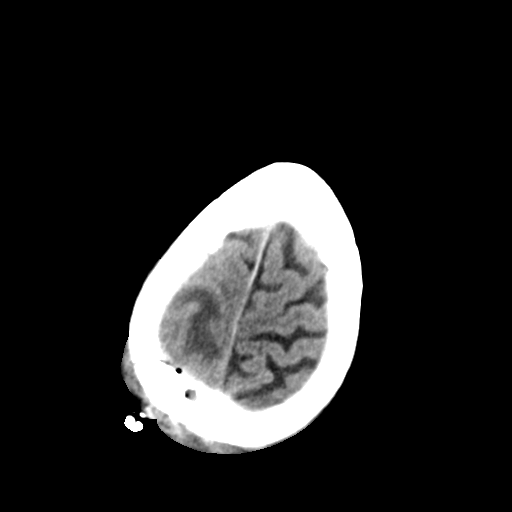

[Series 3: head bone · axial · 0.44mm/px · z∈[-495,-463]mm · 3 of 78 slices shown]
[im 8/78  bone]
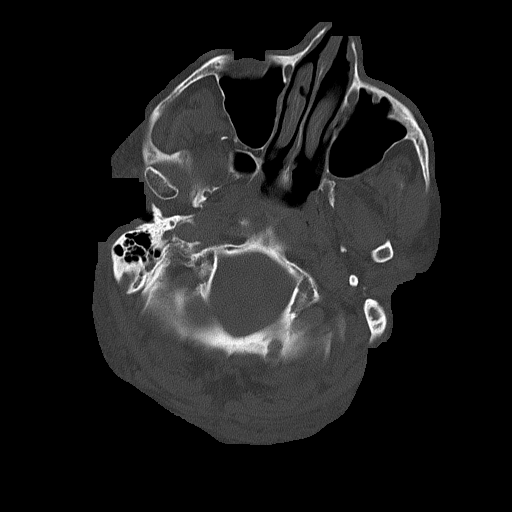
[im 16/78  bone]
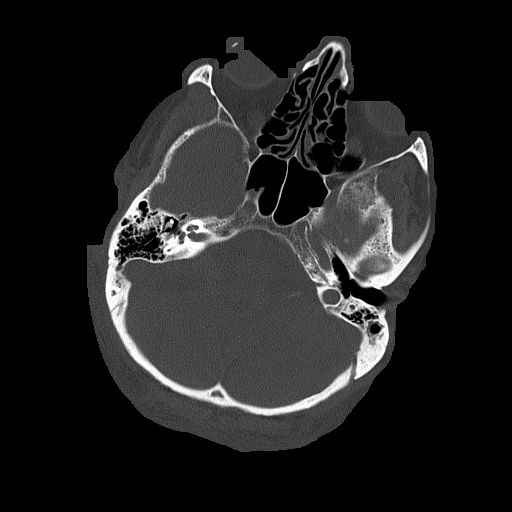
[im 24/78  bone]
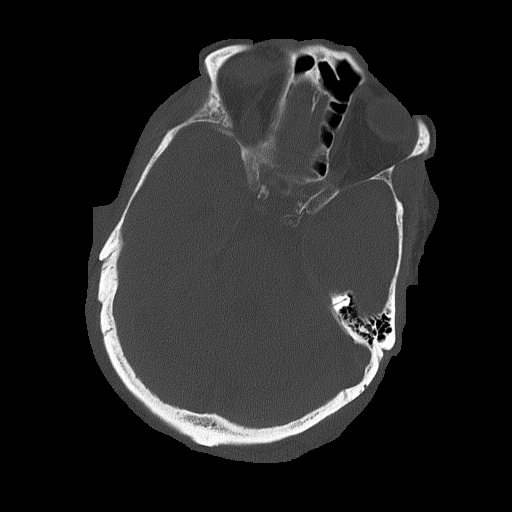

[Series 4: head without cor · coronal · non-contrast · 0.34mm/px · 3 of 67 slices shown]
[im 23/67  brain]
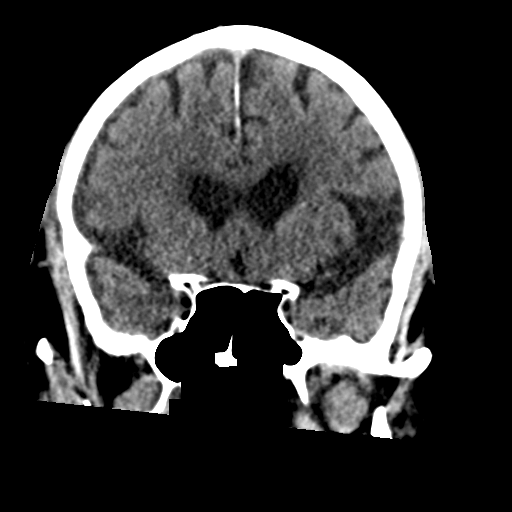
[im 30/67  brain]
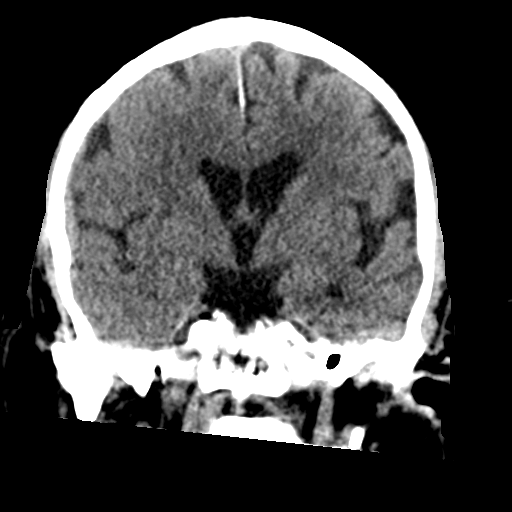
[im 37/67  brain]
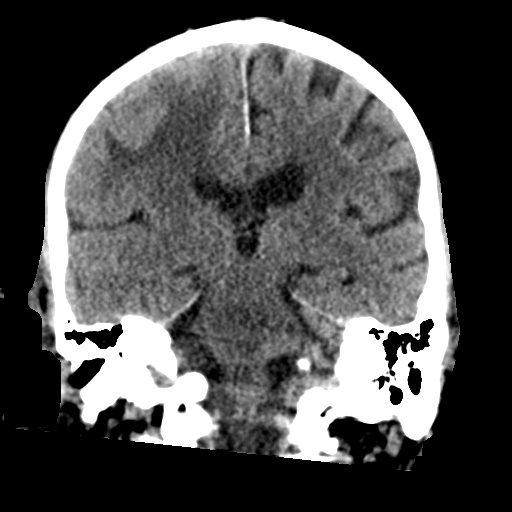

[Series 5: head without sag · sagittal · non-contrast · 0.32mm/px · 3 of 64 slices shown]
[im 22/64  brain]
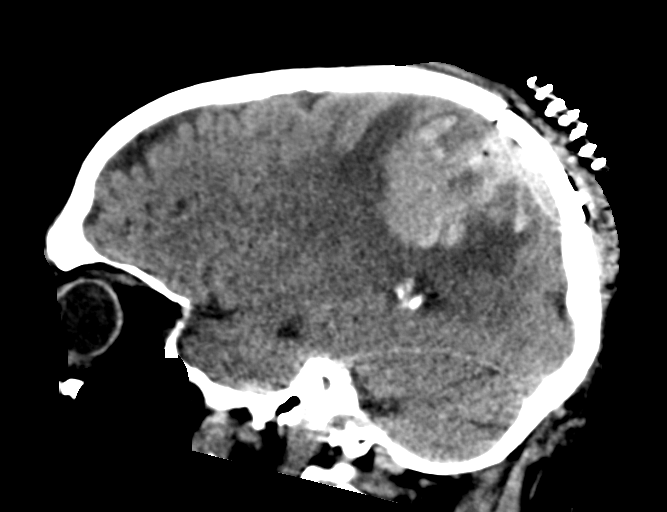
[im 32/64  brain]
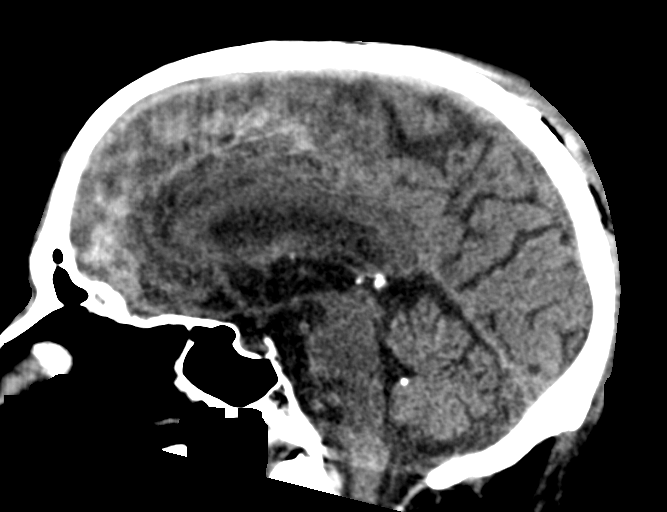
[im 43/64  brain]
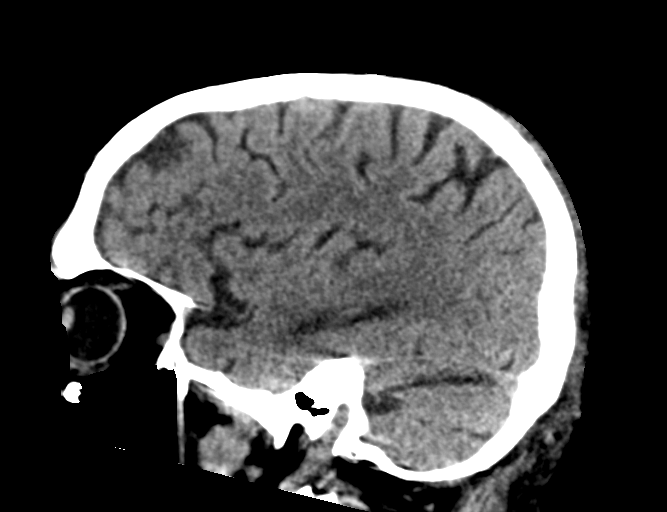

[16 of 47 positions shown; findings below may reference images not displayed]

FINDINGS: Post right parietal craniotomy. Gas and blood containing epidural
collection below the craniotomy site with maximal thickness of
mm without significant change.

Large right parietal lobe hematoma appears minimally more prominent
than on yesterday's examination now measuring 3.7 x 4.3 x 4.2 cm
versus prior 3.2 x 3.7 x 4 cm. Surrounding vasogenic edema and mass
effect upon the right lateral ventricle which is displaced
inferiorly.

MR detected intracranial metastatic lesions are not as well
delineated on the present unenhanced CT.
IMPRESSION: Post right parietal craniotomy. Gas and blood containing epidural
collection below the craniotomy site with maximal thickness of
mm without significant change.

Large right parietal lobe hematoma appears minimally more prominent
than on yesterday's examination now measuring 3.7 x 4.3 x 4.2 cm
versus prior 3.2 x 3.7 x 4 cm. Surrounding vasogenic edema and mass
effect upon the right lateral ventricle which is displaced
inferiorly.
